# Patient Record
Sex: Female | Born: 1955 | Race: White | Hispanic: No | Marital: Single | Smoking: Never smoker
Health system: Southern US, Community
[De-identification: ages and names within clinical notes are randomized; demographics above are authoritative.]

## PROBLEM LIST (undated history)

## (undated) DIAGNOSIS — K219 Gastro-esophageal reflux disease without esophagitis: Secondary | ICD-10-CM

## (undated) DIAGNOSIS — H269 Unspecified cataract: Secondary | ICD-10-CM

## (undated) DIAGNOSIS — Q282 Arteriovenous malformation of cerebral vessels: Secondary | ICD-10-CM

## (undated) DIAGNOSIS — E785 Hyperlipidemia, unspecified: Secondary | ICD-10-CM

## (undated) DIAGNOSIS — T7840XA Allergy, unspecified, initial encounter: Secondary | ICD-10-CM

## (undated) DIAGNOSIS — I639 Cerebral infarction, unspecified: Secondary | ICD-10-CM

## (undated) DIAGNOSIS — R569 Unspecified convulsions: Secondary | ICD-10-CM

## (undated) DIAGNOSIS — M1990 Unspecified osteoarthritis, unspecified site: Secondary | ICD-10-CM

## (undated) DIAGNOSIS — Z9989 Dependence on other enabling machines and devices: Secondary | ICD-10-CM

## (undated) HISTORY — DX: Hyperlipidemia, unspecified: E78.5

## (undated) HISTORY — PX: WISDOM TOOTH EXTRACTION: SHX21

## (undated) HISTORY — DX: Dependence on other enabling machines and devices: Z99.89

## (undated) HISTORY — DX: Allergy, unspecified, initial encounter: T78.40XA

## (undated) HISTORY — DX: Gastro-esophageal reflux disease without esophagitis: K21.9

## (undated) HISTORY — DX: Cerebral infarction, unspecified: I63.9

## (undated) HISTORY — DX: Unspecified cataract: H26.9

## (undated) HISTORY — DX: Unspecified osteoarthritis, unspecified site: M19.90

## (undated) HISTORY — DX: Arteriovenous malformation of cerebral vessels: Q28.2

## (undated) HISTORY — PX: TUBAL LIGATION: SHX77

## (undated) HISTORY — DX: Unspecified convulsions: R56.9

---

## 1978-09-19 HISTORY — PX: BRAIN SURGERY: SHX531

## 1979-05-04 HISTORY — PX: TUBAL LIGATION: SHX77

## 1985-10-01 HISTORY — PX: FOOT SURGERY: SHX648

## 2014-04-18 LAB — HM PAP SMEAR: HM PAP: NORMAL

## 2015-05-04 HISTORY — PX: POLYPECTOMY: SHX149

## 2015-05-04 HISTORY — PX: COLONOSCOPY: SHX174

## 2015-05-08 DIAGNOSIS — G43D1 Abdominal migraine, intractable: Secondary | ICD-10-CM | POA: Diagnosis not present

## 2015-05-08 DIAGNOSIS — G40219 Localization-related (focal) (partial) symptomatic epilepsy and epileptic syndromes with complex partial seizures, intractable, without status epilepticus: Secondary | ICD-10-CM | POA: Diagnosis not present

## 2015-06-26 DIAGNOSIS — D485 Neoplasm of uncertain behavior of skin: Secondary | ICD-10-CM | POA: Diagnosis not present

## 2015-06-26 DIAGNOSIS — D1801 Hemangioma of skin and subcutaneous tissue: Secondary | ICD-10-CM | POA: Diagnosis not present

## 2015-06-26 DIAGNOSIS — L820 Inflamed seborrheic keratosis: Secondary | ICD-10-CM | POA: Diagnosis not present

## 2015-06-26 DIAGNOSIS — L821 Other seborrheic keratosis: Secondary | ICD-10-CM | POA: Diagnosis not present

## 2015-06-26 DIAGNOSIS — L570 Actinic keratosis: Secondary | ICD-10-CM | POA: Diagnosis not present

## 2015-06-26 DIAGNOSIS — D225 Melanocytic nevi of trunk: Secondary | ICD-10-CM | POA: Diagnosis not present

## 2015-07-02 DIAGNOSIS — D128 Benign neoplasm of rectum: Secondary | ICD-10-CM | POA: Diagnosis not present

## 2015-07-02 DIAGNOSIS — D123 Benign neoplasm of transverse colon: Secondary | ICD-10-CM | POA: Diagnosis not present

## 2015-07-02 DIAGNOSIS — K621 Rectal polyp: Secondary | ICD-10-CM | POA: Diagnosis not present

## 2015-07-02 DIAGNOSIS — K640 First degree hemorrhoids: Secondary | ICD-10-CM | POA: Diagnosis not present

## 2015-07-02 DIAGNOSIS — Z86010 Personal history of colonic polyps: Secondary | ICD-10-CM | POA: Diagnosis not present

## 2015-07-02 LAB — HM COLONOSCOPY

## 2015-09-08 DIAGNOSIS — H16223 Keratoconjunctivitis sicca, not specified as Sjogren's, bilateral: Secondary | ICD-10-CM | POA: Diagnosis not present

## 2015-09-08 DIAGNOSIS — Z0101 Encounter for examination of eyes and vision with abnormal findings: Secondary | ICD-10-CM | POA: Diagnosis not present

## 2015-09-08 DIAGNOSIS — H2513 Age-related nuclear cataract, bilateral: Secondary | ICD-10-CM | POA: Diagnosis not present

## 2015-09-12 DIAGNOSIS — Z8679 Personal history of other diseases of the circulatory system: Secondary | ICD-10-CM | POA: Diagnosis not present

## 2015-09-12 DIAGNOSIS — S098XXA Other specified injuries of head, initial encounter: Secondary | ICD-10-CM | POA: Diagnosis not present

## 2015-09-12 DIAGNOSIS — Z7982 Long term (current) use of aspirin: Secondary | ICD-10-CM | POA: Diagnosis not present

## 2015-09-12 DIAGNOSIS — M25562 Pain in left knee: Secondary | ICD-10-CM | POA: Diagnosis not present

## 2015-09-12 DIAGNOSIS — Z79899 Other long term (current) drug therapy: Secondary | ICD-10-CM | POA: Diagnosis not present

## 2015-09-12 DIAGNOSIS — Q283 Other malformations of cerebral vessels: Secondary | ICD-10-CM | POA: Diagnosis not present

## 2015-09-12 DIAGNOSIS — M25551 Pain in right hip: Secondary | ICD-10-CM | POA: Diagnosis not present

## 2015-09-12 DIAGNOSIS — S80212A Abrasion, left knee, initial encounter: Secondary | ICD-10-CM | POA: Diagnosis not present

## 2015-09-12 DIAGNOSIS — S0990XA Unspecified injury of head, initial encounter: Secondary | ICD-10-CM | POA: Diagnosis not present

## 2015-09-12 DIAGNOSIS — S0181XA Laceration without foreign body of other part of head, initial encounter: Secondary | ICD-10-CM | POA: Diagnosis not present

## 2015-09-12 DIAGNOSIS — S064X0A Epidural hemorrhage without loss of consciousness, initial encounter: Secondary | ICD-10-CM | POA: Diagnosis not present

## 2015-09-12 DIAGNOSIS — M542 Cervicalgia: Secondary | ICD-10-CM | POA: Diagnosis not present

## 2015-12-25 DIAGNOSIS — Z0000 Encounter for general adult medical examination without abnormal findings: Secondary | ICD-10-CM | POA: Diagnosis not present

## 2015-12-25 DIAGNOSIS — G40219 Localization-related (focal) (partial) symptomatic epilepsy and epileptic syndromes with complex partial seizures, intractable, without status epilepticus: Secondary | ICD-10-CM | POA: Diagnosis not present

## 2015-12-25 DIAGNOSIS — Z79899 Other long term (current) drug therapy: Secondary | ICD-10-CM | POA: Diagnosis not present

## 2015-12-29 DIAGNOSIS — G8191 Hemiplegia, unspecified affecting right dominant side: Secondary | ICD-10-CM | POA: Insufficient documentation

## 2016-01-01 DIAGNOSIS — Z79899 Other long term (current) drug therapy: Secondary | ICD-10-CM | POA: Diagnosis not present

## 2016-01-01 DIAGNOSIS — G43919 Migraine, unspecified, intractable, without status migrainosus: Secondary | ICD-10-CM | POA: Diagnosis not present

## 2016-01-01 DIAGNOSIS — G40909 Epilepsy, unspecified, not intractable, without status epilepticus: Secondary | ICD-10-CM | POA: Diagnosis not present

## 2016-01-01 DIAGNOSIS — M6281 Muscle weakness (generalized): Secondary | ICD-10-CM | POA: Diagnosis not present

## 2016-01-02 DIAGNOSIS — G43919 Migraine, unspecified, intractable, without status migrainosus: Secondary | ICD-10-CM | POA: Diagnosis not present

## 2016-01-02 DIAGNOSIS — G40909 Epilepsy, unspecified, not intractable, without status epilepticus: Secondary | ICD-10-CM | POA: Diagnosis not present

## 2016-01-02 DIAGNOSIS — M6281 Muscle weakness (generalized): Secondary | ICD-10-CM | POA: Diagnosis not present

## 2016-01-02 DIAGNOSIS — Z79899 Other long term (current) drug therapy: Secondary | ICD-10-CM | POA: Diagnosis not present

## 2016-01-06 DIAGNOSIS — M6281 Muscle weakness (generalized): Secondary | ICD-10-CM | POA: Diagnosis not present

## 2016-01-06 DIAGNOSIS — G43919 Migraine, unspecified, intractable, without status migrainosus: Secondary | ICD-10-CM | POA: Diagnosis not present

## 2016-01-06 DIAGNOSIS — G40909 Epilepsy, unspecified, not intractable, without status epilepticus: Secondary | ICD-10-CM | POA: Diagnosis not present

## 2016-01-06 DIAGNOSIS — Z79899 Other long term (current) drug therapy: Secondary | ICD-10-CM | POA: Diagnosis not present

## 2016-01-07 DIAGNOSIS — G40909 Epilepsy, unspecified, not intractable, without status epilepticus: Secondary | ICD-10-CM | POA: Diagnosis not present

## 2016-01-07 DIAGNOSIS — Z79899 Other long term (current) drug therapy: Secondary | ICD-10-CM | POA: Diagnosis not present

## 2016-01-07 DIAGNOSIS — M6281 Muscle weakness (generalized): Secondary | ICD-10-CM | POA: Diagnosis not present

## 2016-01-07 DIAGNOSIS — G43919 Migraine, unspecified, intractable, without status migrainosus: Secondary | ICD-10-CM | POA: Diagnosis not present

## 2016-01-08 DIAGNOSIS — M6281 Muscle weakness (generalized): Secondary | ICD-10-CM | POA: Diagnosis not present

## 2016-01-08 DIAGNOSIS — Z79899 Other long term (current) drug therapy: Secondary | ICD-10-CM | POA: Diagnosis not present

## 2016-01-08 DIAGNOSIS — G40909 Epilepsy, unspecified, not intractable, without status epilepticus: Secondary | ICD-10-CM | POA: Diagnosis not present

## 2016-01-08 DIAGNOSIS — G43919 Migraine, unspecified, intractable, without status migrainosus: Secondary | ICD-10-CM | POA: Diagnosis not present

## 2016-01-13 DIAGNOSIS — G40909 Epilepsy, unspecified, not intractable, without status epilepticus: Secondary | ICD-10-CM | POA: Diagnosis not present

## 2016-01-13 DIAGNOSIS — G43919 Migraine, unspecified, intractable, without status migrainosus: Secondary | ICD-10-CM | POA: Diagnosis not present

## 2016-01-13 DIAGNOSIS — M6281 Muscle weakness (generalized): Secondary | ICD-10-CM | POA: Diagnosis not present

## 2016-01-13 DIAGNOSIS — Z79899 Other long term (current) drug therapy: Secondary | ICD-10-CM | POA: Diagnosis not present

## 2016-01-15 DIAGNOSIS — G40909 Epilepsy, unspecified, not intractable, without status epilepticus: Secondary | ICD-10-CM | POA: Diagnosis not present

## 2016-01-15 DIAGNOSIS — G43919 Migraine, unspecified, intractable, without status migrainosus: Secondary | ICD-10-CM | POA: Diagnosis not present

## 2016-01-15 DIAGNOSIS — M6281 Muscle weakness (generalized): Secondary | ICD-10-CM | POA: Diagnosis not present

## 2016-01-15 DIAGNOSIS — Z79899 Other long term (current) drug therapy: Secondary | ICD-10-CM | POA: Diagnosis not present

## 2016-01-20 DIAGNOSIS — G43919 Migraine, unspecified, intractable, without status migrainosus: Secondary | ICD-10-CM | POA: Diagnosis not present

## 2016-01-20 DIAGNOSIS — G40909 Epilepsy, unspecified, not intractable, without status epilepticus: Secondary | ICD-10-CM | POA: Diagnosis not present

## 2016-01-20 DIAGNOSIS — M6281 Muscle weakness (generalized): Secondary | ICD-10-CM | POA: Diagnosis not present

## 2016-01-20 DIAGNOSIS — Z79899 Other long term (current) drug therapy: Secondary | ICD-10-CM | POA: Diagnosis not present

## 2016-01-22 DIAGNOSIS — M6281 Muscle weakness (generalized): Secondary | ICD-10-CM | POA: Diagnosis not present

## 2016-01-22 DIAGNOSIS — G43919 Migraine, unspecified, intractable, without status migrainosus: Secondary | ICD-10-CM | POA: Diagnosis not present

## 2016-01-22 DIAGNOSIS — Z79899 Other long term (current) drug therapy: Secondary | ICD-10-CM | POA: Diagnosis not present

## 2016-01-22 DIAGNOSIS — G40909 Epilepsy, unspecified, not intractable, without status epilepticus: Secondary | ICD-10-CM | POA: Diagnosis not present

## 2016-01-27 DIAGNOSIS — G40909 Epilepsy, unspecified, not intractable, without status epilepticus: Secondary | ICD-10-CM | POA: Diagnosis not present

## 2016-01-27 DIAGNOSIS — G43919 Migraine, unspecified, intractable, without status migrainosus: Secondary | ICD-10-CM | POA: Diagnosis not present

## 2016-01-27 DIAGNOSIS — M6281 Muscle weakness (generalized): Secondary | ICD-10-CM | POA: Diagnosis not present

## 2016-01-27 DIAGNOSIS — Z79899 Other long term (current) drug therapy: Secondary | ICD-10-CM | POA: Diagnosis not present

## 2016-01-29 DIAGNOSIS — M6281 Muscle weakness (generalized): Secondary | ICD-10-CM | POA: Diagnosis not present

## 2016-01-29 DIAGNOSIS — G40909 Epilepsy, unspecified, not intractable, without status epilepticus: Secondary | ICD-10-CM | POA: Diagnosis not present

## 2016-01-29 DIAGNOSIS — Z79899 Other long term (current) drug therapy: Secondary | ICD-10-CM | POA: Diagnosis not present

## 2016-01-29 DIAGNOSIS — G43919 Migraine, unspecified, intractable, without status migrainosus: Secondary | ICD-10-CM | POA: Diagnosis not present

## 2016-02-02 DIAGNOSIS — Z23 Encounter for immunization: Secondary | ICD-10-CM | POA: Diagnosis not present

## 2016-02-03 DIAGNOSIS — G43919 Migraine, unspecified, intractable, without status migrainosus: Secondary | ICD-10-CM | POA: Diagnosis not present

## 2016-02-03 DIAGNOSIS — M6281 Muscle weakness (generalized): Secondary | ICD-10-CM | POA: Diagnosis not present

## 2016-02-03 DIAGNOSIS — G40909 Epilepsy, unspecified, not intractable, without status epilepticus: Secondary | ICD-10-CM | POA: Diagnosis not present

## 2016-02-03 DIAGNOSIS — Z79899 Other long term (current) drug therapy: Secondary | ICD-10-CM | POA: Diagnosis not present

## 2016-02-12 DIAGNOSIS — Z79899 Other long term (current) drug therapy: Secondary | ICD-10-CM | POA: Diagnosis not present

## 2016-02-12 DIAGNOSIS — G43919 Migraine, unspecified, intractable, without status migrainosus: Secondary | ICD-10-CM | POA: Diagnosis not present

## 2016-02-12 DIAGNOSIS — G40909 Epilepsy, unspecified, not intractable, without status epilepticus: Secondary | ICD-10-CM | POA: Diagnosis not present

## 2016-02-12 DIAGNOSIS — M6281 Muscle weakness (generalized): Secondary | ICD-10-CM | POA: Diagnosis not present

## 2016-02-27 DIAGNOSIS — G43919 Migraine, unspecified, intractable, without status migrainosus: Secondary | ICD-10-CM | POA: Diagnosis not present

## 2016-02-27 DIAGNOSIS — M6281 Muscle weakness (generalized): Secondary | ICD-10-CM | POA: Diagnosis not present

## 2016-02-27 DIAGNOSIS — G40909 Epilepsy, unspecified, not intractable, without status epilepticus: Secondary | ICD-10-CM | POA: Diagnosis not present

## 2016-02-27 DIAGNOSIS — Z79899 Other long term (current) drug therapy: Secondary | ICD-10-CM | POA: Diagnosis not present

## 2016-03-24 LAB — HM MAMMOGRAPHY: HM Mammogram: NORMAL (ref 0–4)

## 2016-04-13 DIAGNOSIS — M5030 Other cervical disc degeneration, unspecified cervical region: Secondary | ICD-10-CM | POA: Diagnosis not present

## 2016-04-13 DIAGNOSIS — M9901 Segmental and somatic dysfunction of cervical region: Secondary | ICD-10-CM | POA: Diagnosis not present

## 2016-04-14 DIAGNOSIS — R51 Headache: Secondary | ICD-10-CM

## 2016-04-14 DIAGNOSIS — R519 Headache, unspecified: Secondary | ICD-10-CM | POA: Insufficient documentation

## 2016-04-15 DIAGNOSIS — G4489 Other headache syndrome: Secondary | ICD-10-CM | POA: Diagnosis not present

## 2016-04-15 DIAGNOSIS — R51 Headache: Secondary | ICD-10-CM | POA: Diagnosis not present

## 2016-04-15 DIAGNOSIS — R52 Pain, unspecified: Secondary | ICD-10-CM | POA: Diagnosis not present

## 2016-04-15 DIAGNOSIS — Z8679 Personal history of other diseases of the circulatory system: Secondary | ICD-10-CM | POA: Diagnosis not present

## 2016-04-15 DIAGNOSIS — Z79899 Other long term (current) drug therapy: Secondary | ICD-10-CM | POA: Diagnosis not present

## 2016-04-15 DIAGNOSIS — Z7982 Long term (current) use of aspirin: Secondary | ICD-10-CM | POA: Diagnosis not present

## 2016-04-16 DIAGNOSIS — R51 Headache: Secondary | ICD-10-CM | POA: Diagnosis not present

## 2016-04-18 DIAGNOSIS — Z23 Encounter for immunization: Secondary | ICD-10-CM | POA: Diagnosis not present

## 2016-04-30 DIAGNOSIS — Z1231 Encounter for screening mammogram for malignant neoplasm of breast: Secondary | ICD-10-CM | POA: Diagnosis not present

## 2016-04-30 DIAGNOSIS — Z780 Asymptomatic menopausal state: Secondary | ICD-10-CM | POA: Diagnosis not present

## 2016-04-30 LAB — HM MAMMOGRAPHY

## 2016-05-07 DIAGNOSIS — G43911 Migraine, unspecified, intractable, with status migrainosus: Secondary | ICD-10-CM | POA: Diagnosis not present

## 2016-05-07 DIAGNOSIS — G40909 Epilepsy, unspecified, not intractable, without status epilepticus: Secondary | ICD-10-CM | POA: Diagnosis not present

## 2016-05-07 DIAGNOSIS — M6281 Muscle weakness (generalized): Secondary | ICD-10-CM | POA: Diagnosis not present

## 2016-05-08 DIAGNOSIS — R509 Fever, unspecified: Secondary | ICD-10-CM | POA: Diagnosis not present

## 2016-05-08 DIAGNOSIS — J0100 Acute maxillary sinusitis, unspecified: Secondary | ICD-10-CM | POA: Diagnosis not present

## 2016-05-10 DIAGNOSIS — M6281 Muscle weakness (generalized): Secondary | ICD-10-CM | POA: Diagnosis not present

## 2016-05-10 DIAGNOSIS — G40909 Epilepsy, unspecified, not intractable, without status epilepticus: Secondary | ICD-10-CM | POA: Diagnosis not present

## 2016-05-10 DIAGNOSIS — G43911 Migraine, unspecified, intractable, with status migrainosus: Secondary | ICD-10-CM | POA: Diagnosis not present

## 2016-05-11 DIAGNOSIS — G40909 Epilepsy, unspecified, not intractable, without status epilepticus: Secondary | ICD-10-CM | POA: Diagnosis not present

## 2016-05-11 DIAGNOSIS — G43911 Migraine, unspecified, intractable, with status migrainosus: Secondary | ICD-10-CM | POA: Diagnosis not present

## 2016-05-11 DIAGNOSIS — M6281 Muscle weakness (generalized): Secondary | ICD-10-CM | POA: Diagnosis not present

## 2016-05-13 DIAGNOSIS — G40909 Epilepsy, unspecified, not intractable, without status epilepticus: Secondary | ICD-10-CM | POA: Diagnosis not present

## 2016-05-13 DIAGNOSIS — M6281 Muscle weakness (generalized): Secondary | ICD-10-CM | POA: Diagnosis not present

## 2016-05-13 DIAGNOSIS — G43911 Migraine, unspecified, intractable, with status migrainosus: Secondary | ICD-10-CM | POA: Diagnosis not present

## 2016-05-14 DIAGNOSIS — M6281 Muscle weakness (generalized): Secondary | ICD-10-CM | POA: Diagnosis not present

## 2016-05-14 DIAGNOSIS — G40909 Epilepsy, unspecified, not intractable, without status epilepticus: Secondary | ICD-10-CM | POA: Diagnosis not present

## 2016-05-14 DIAGNOSIS — G43911 Migraine, unspecified, intractable, with status migrainosus: Secondary | ICD-10-CM | POA: Diagnosis not present

## 2016-05-18 DIAGNOSIS — G40909 Epilepsy, unspecified, not intractable, without status epilepticus: Secondary | ICD-10-CM | POA: Diagnosis not present

## 2016-05-18 DIAGNOSIS — M6281 Muscle weakness (generalized): Secondary | ICD-10-CM | POA: Diagnosis not present

## 2016-05-18 DIAGNOSIS — G43911 Migraine, unspecified, intractable, with status migrainosus: Secondary | ICD-10-CM | POA: Diagnosis not present

## 2016-05-20 DIAGNOSIS — G43911 Migraine, unspecified, intractable, with status migrainosus: Secondary | ICD-10-CM | POA: Diagnosis not present

## 2016-05-20 DIAGNOSIS — G40909 Epilepsy, unspecified, not intractable, without status epilepticus: Secondary | ICD-10-CM | POA: Diagnosis not present

## 2016-05-20 DIAGNOSIS — M6281 Muscle weakness (generalized): Secondary | ICD-10-CM | POA: Diagnosis not present

## 2016-05-21 DIAGNOSIS — G40909 Epilepsy, unspecified, not intractable, without status epilepticus: Secondary | ICD-10-CM | POA: Diagnosis not present

## 2016-05-21 DIAGNOSIS — M6281 Muscle weakness (generalized): Secondary | ICD-10-CM | POA: Diagnosis not present

## 2016-05-21 DIAGNOSIS — G43911 Migraine, unspecified, intractable, with status migrainosus: Secondary | ICD-10-CM | POA: Diagnosis not present

## 2016-05-25 DIAGNOSIS — M6281 Muscle weakness (generalized): Secondary | ICD-10-CM | POA: Diagnosis not present

## 2016-05-25 DIAGNOSIS — G43911 Migraine, unspecified, intractable, with status migrainosus: Secondary | ICD-10-CM | POA: Diagnosis not present

## 2016-05-25 DIAGNOSIS — G40909 Epilepsy, unspecified, not intractable, without status epilepticus: Secondary | ICD-10-CM | POA: Diagnosis not present

## 2016-05-27 DIAGNOSIS — G43911 Migraine, unspecified, intractable, with status migrainosus: Secondary | ICD-10-CM | POA: Diagnosis not present

## 2016-05-27 DIAGNOSIS — G40909 Epilepsy, unspecified, not intractable, without status epilepticus: Secondary | ICD-10-CM | POA: Diagnosis not present

## 2016-05-27 DIAGNOSIS — M6281 Muscle weakness (generalized): Secondary | ICD-10-CM | POA: Diagnosis not present

## 2016-05-28 DIAGNOSIS — G43911 Migraine, unspecified, intractable, with status migrainosus: Secondary | ICD-10-CM | POA: Diagnosis not present

## 2016-05-28 DIAGNOSIS — G40909 Epilepsy, unspecified, not intractable, without status epilepticus: Secondary | ICD-10-CM | POA: Diagnosis not present

## 2016-05-28 DIAGNOSIS — M6281 Muscle weakness (generalized): Secondary | ICD-10-CM | POA: Diagnosis not present

## 2016-06-03 DIAGNOSIS — G43911 Migraine, unspecified, intractable, with status migrainosus: Secondary | ICD-10-CM | POA: Diagnosis not present

## 2016-06-03 DIAGNOSIS — G40909 Epilepsy, unspecified, not intractable, without status epilepticus: Secondary | ICD-10-CM | POA: Diagnosis not present

## 2016-06-03 DIAGNOSIS — M6281 Muscle weakness (generalized): Secondary | ICD-10-CM | POA: Diagnosis not present

## 2016-06-04 DIAGNOSIS — G43911 Migraine, unspecified, intractable, with status migrainosus: Secondary | ICD-10-CM | POA: Diagnosis not present

## 2016-06-04 DIAGNOSIS — M6281 Muscle weakness (generalized): Secondary | ICD-10-CM | POA: Diagnosis not present

## 2016-06-04 DIAGNOSIS — G40909 Epilepsy, unspecified, not intractable, without status epilepticus: Secondary | ICD-10-CM | POA: Diagnosis not present

## 2016-06-08 DIAGNOSIS — G40909 Epilepsy, unspecified, not intractable, without status epilepticus: Secondary | ICD-10-CM | POA: Diagnosis not present

## 2016-06-08 DIAGNOSIS — G43911 Migraine, unspecified, intractable, with status migrainosus: Secondary | ICD-10-CM | POA: Diagnosis not present

## 2016-06-08 DIAGNOSIS — M6281 Muscle weakness (generalized): Secondary | ICD-10-CM | POA: Diagnosis not present

## 2016-06-10 DIAGNOSIS — G43911 Migraine, unspecified, intractable, with status migrainosus: Secondary | ICD-10-CM | POA: Diagnosis not present

## 2016-06-10 DIAGNOSIS — M6281 Muscle weakness (generalized): Secondary | ICD-10-CM | POA: Diagnosis not present

## 2016-06-10 DIAGNOSIS — G40909 Epilepsy, unspecified, not intractable, without status epilepticus: Secondary | ICD-10-CM | POA: Diagnosis not present

## 2016-06-15 DIAGNOSIS — G43911 Migraine, unspecified, intractable, with status migrainosus: Secondary | ICD-10-CM | POA: Diagnosis not present

## 2016-06-15 DIAGNOSIS — G40909 Epilepsy, unspecified, not intractable, without status epilepticus: Secondary | ICD-10-CM | POA: Diagnosis not present

## 2016-06-15 DIAGNOSIS — M6281 Muscle weakness (generalized): Secondary | ICD-10-CM | POA: Diagnosis not present

## 2016-06-17 DIAGNOSIS — G43911 Migraine, unspecified, intractable, with status migrainosus: Secondary | ICD-10-CM | POA: Diagnosis not present

## 2016-06-17 DIAGNOSIS — G40909 Epilepsy, unspecified, not intractable, without status epilepticus: Secondary | ICD-10-CM | POA: Diagnosis not present

## 2016-06-17 DIAGNOSIS — M6281 Muscle weakness (generalized): Secondary | ICD-10-CM | POA: Diagnosis not present

## 2016-06-21 DIAGNOSIS — M6281 Muscle weakness (generalized): Secondary | ICD-10-CM | POA: Diagnosis not present

## 2016-06-21 DIAGNOSIS — G43911 Migraine, unspecified, intractable, with status migrainosus: Secondary | ICD-10-CM | POA: Diagnosis not present

## 2016-06-21 DIAGNOSIS — G40909 Epilepsy, unspecified, not intractable, without status epilepticus: Secondary | ICD-10-CM | POA: Diagnosis not present

## 2016-06-22 DIAGNOSIS — G43D1 Abdominal migraine, intractable: Secondary | ICD-10-CM | POA: Diagnosis not present

## 2016-06-22 DIAGNOSIS — G40219 Localization-related (focal) (partial) symptomatic epilepsy and epileptic syndromes with complex partial seizures, intractable, without status epilepticus: Secondary | ICD-10-CM | POA: Diagnosis not present

## 2016-06-28 DIAGNOSIS — M6281 Muscle weakness (generalized): Secondary | ICD-10-CM | POA: Diagnosis not present

## 2016-06-28 DIAGNOSIS — G40909 Epilepsy, unspecified, not intractable, without status epilepticus: Secondary | ICD-10-CM | POA: Diagnosis not present

## 2016-06-28 DIAGNOSIS — G43911 Migraine, unspecified, intractable, with status migrainosus: Secondary | ICD-10-CM | POA: Diagnosis not present

## 2016-07-22 DIAGNOSIS — D1801 Hemangioma of skin and subcutaneous tissue: Secondary | ICD-10-CM | POA: Diagnosis not present

## 2016-07-22 DIAGNOSIS — D225 Melanocytic nevi of trunk: Secondary | ICD-10-CM | POA: Diagnosis not present

## 2016-07-22 DIAGNOSIS — L570 Actinic keratosis: Secondary | ICD-10-CM | POA: Diagnosis not present

## 2016-07-22 DIAGNOSIS — D485 Neoplasm of uncertain behavior of skin: Secondary | ICD-10-CM | POA: Diagnosis not present

## 2016-07-22 DIAGNOSIS — L821 Other seborrheic keratosis: Secondary | ICD-10-CM | POA: Diagnosis not present

## 2016-08-17 DIAGNOSIS — L570 Actinic keratosis: Secondary | ICD-10-CM | POA: Diagnosis not present

## 2016-09-15 DIAGNOSIS — H2513 Age-related nuclear cataract, bilateral: Secondary | ICD-10-CM | POA: Diagnosis not present

## 2016-09-15 DIAGNOSIS — Z0101 Encounter for examination of eyes and vision with abnormal findings: Secondary | ICD-10-CM | POA: Diagnosis not present

## 2016-11-15 ENCOUNTER — Encounter: Payer: Self-pay | Admitting: Internal Medicine

## 2016-11-15 ENCOUNTER — Ambulatory Visit (INDEPENDENT_AMBULATORY_CARE_PROVIDER_SITE_OTHER): Payer: Medicare Other | Admitting: Internal Medicine

## 2016-11-15 VITALS — BP 132/76 | HR 69 | Temp 97.5°F | Resp 16 | Ht 66.75 in | Wt 159.2 lb

## 2016-11-15 DIAGNOSIS — G40209 Localization-related (focal) (partial) symptomatic epilepsy and epileptic syndromes with complex partial seizures, not intractable, without status epilepticus: Secondary | ICD-10-CM

## 2016-11-15 DIAGNOSIS — E559 Vitamin D deficiency, unspecified: Secondary | ICD-10-CM

## 2016-11-15 DIAGNOSIS — R51 Headache: Secondary | ICD-10-CM

## 2016-11-15 DIAGNOSIS — R7309 Other abnormal glucose: Secondary | ICD-10-CM | POA: Diagnosis not present

## 2016-11-15 DIAGNOSIS — R0989 Other specified symptoms and signs involving the circulatory and respiratory systems: Secondary | ICD-10-CM

## 2016-11-15 DIAGNOSIS — E538 Deficiency of other specified B group vitamins: Secondary | ICD-10-CM

## 2016-11-15 DIAGNOSIS — G40219 Localization-related (focal) (partial) symptomatic epilepsy and epileptic syndromes with complex partial seizures, intractable, without status epilepticus: Secondary | ICD-10-CM | POA: Insufficient documentation

## 2016-11-15 DIAGNOSIS — Z79899 Other long term (current) drug therapy: Secondary | ICD-10-CM | POA: Diagnosis not present

## 2016-11-15 DIAGNOSIS — Z13220 Encounter for screening for lipoid disorders: Secondary | ICD-10-CM

## 2016-11-15 DIAGNOSIS — G40309 Generalized idiopathic epilepsy and epileptic syndromes, not intractable, without status epilepticus: Secondary | ICD-10-CM | POA: Diagnosis not present

## 2016-11-15 DIAGNOSIS — R519 Headache, unspecified: Secondary | ICD-10-CM

## 2016-11-15 DIAGNOSIS — G40909 Epilepsy, unspecified, not intractable, without status epilepticus: Secondary | ICD-10-CM | POA: Insufficient documentation

## 2016-11-15 DIAGNOSIS — G40409 Other generalized epilepsy and epileptic syndromes, not intractable, without status epilepticus: Secondary | ICD-10-CM | POA: Insufficient documentation

## 2016-11-15 DIAGNOSIS — G8929 Other chronic pain: Secondary | ICD-10-CM

## 2016-11-15 LAB — HEPATIC FUNCTION PANEL
ALBUMIN: 4 g/dL (ref 3.6–5.1)
ALK PHOS: 105 U/L (ref 33–130)
ALT: 21 U/L (ref 6–29)
AST: 19 U/L (ref 10–35)
BILIRUBIN TOTAL: 0.2 mg/dL (ref 0.2–1.2)
Bilirubin, Direct: 0 mg/dL (ref <=0.2)
Indirect Bilirubin: 0.2 mg/dL (ref 0.2–1.2)
TOTAL PROTEIN: 6.9 g/dL (ref 6.1–8.1)

## 2016-11-15 LAB — CBC WITH DIFFERENTIAL/PLATELET
Basophils Absolute: 0 cells/uL (ref 0–200)
Basophils Relative: 0 %
Eosinophils Absolute: 62 cells/uL (ref 15–500)
Eosinophils Relative: 1 %
HCT: 40.1 % (ref 35.0–45.0)
Hemoglobin: 13.2 g/dL (ref 11.7–15.5)
Lymphocytes Relative: 30 %
Lymphs Abs: 1860 cells/uL (ref 850–3900)
MCH: 31.3 pg (ref 27.0–33.0)
MCHC: 32.9 g/dL (ref 32.0–36.0)
MCV: 95 fL (ref 80.0–100.0)
MPV: 10.7 fL (ref 7.5–12.5)
Monocytes Absolute: 558 cells/uL (ref 200–950)
Monocytes Relative: 9 %
Neutro Abs: 3720 cells/uL (ref 1500–7800)
Neutrophils Relative %: 60 %
Platelets: 245 10*3/uL (ref 140–400)
RBC: 4.22 MIL/uL (ref 3.80–5.10)
RDW: 12.9 % (ref 11.0–15.0)
WBC: 6.2 10*3/uL (ref 3.8–10.8)

## 2016-11-15 LAB — BASIC METABOLIC PANEL WITH GFR
BUN: 28 mg/dL — AB (ref 7–25)
CALCIUM: 9.5 mg/dL (ref 8.6–10.4)
CO2: 26 mmol/L (ref 20–31)
Chloride: 107 mmol/L (ref 98–110)
Creat: 1.19 mg/dL — ABNORMAL HIGH (ref 0.50–1.05)
GFR, EST AFRICAN AMERICAN: 58 mL/min — AB (ref >=60)
GFR, EST NON AFRICAN AMERICAN: 50 mL/min — AB (ref >=60)
GLUCOSE: 101 mg/dL — AB (ref 65–99)
POTASSIUM: 3.8 mmol/L (ref 3.5–5.3)
Sodium: 141 mmol/L (ref 135–146)

## 2016-11-15 LAB — LIPID PANEL
CHOLESTEROL: 167 mg/dL (ref <200)
HDL: 30 mg/dL — AB (ref >50)
LDL CALC: 68 mg/dL (ref <100)
TRIGLYCERIDES: 344 mg/dL — AB (ref <150)
Total CHOL/HDL Ratio: 5.6 Ratio — ABNORMAL HIGH (ref <5.0)
VLDL: 69 mg/dL — ABNORMAL HIGH (ref <30)

## 2016-11-15 LAB — TSH: TSH: 1.61 mIU/L

## 2016-11-15 NOTE — Patient Instructions (Signed)

## 2016-11-15 NOTE — Progress Notes (Signed)
[facility]   Unk Pinto, M.D.      Uvaldo Bristle. Silverio Lay, P.A.-C [facility]                [place] [address] Telephone [phone] Telefax [phone]      This very nice 61 y.o. SWF presents as a new patient for evaluation and to establish care having recently relocated to [place].     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 She had her 1st stroke at age 48 in 52 and had 3 more strokes by 1980 when she underwent her 1st surgery for AVM. She relates her last seizure was 57. Apparently she had her last Head CT scan in Jan 2018 at [facility] in [place]. 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      Patient is screened expectantly for elevated BP. Patient's BP has been controlled at home.  Today's BP is at goal - 132/76. Patient denies any cardiac symptoms as chest pain, palpitations, shortness of breath, dizziness or ankle swelling.     Patient is screened expectantly for abnormal lipids.   Patient is screened expectantly for glucose intolerance/insulin resistance. and patient denies reactive hypoglycemic symptoms, visual blurring, diabetic polys or paresthesias.  Finally, patient  Is screened expectantly for Vitamin D Deficiency.  Meds are reviewed per MAR.  Allergies  Allergen Reactions  . Aspirin Other (See Comments)    CONTRAINDICATED DUE TO HISTORY OF BLEEDING IN BRAIN   Past Medical History:  Diagnosis Date  . AVM (arteriovenous malformation) brain   . 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 Pneumococcal-Unspecified 05/03/2004  . Tdap 08/11/2009  . Zoster 04/14/2012   Past Surgical History:  Procedure Laterality Date  . BRAIN SURGERY     Family History  Problem Relation Age of  Onset  . Heart disease Mother   . Diabetes Father   . Hyperlipidemia Father   . Hypertension Father   . Dementia Father   . Hyperlipidemia Paternal Uncle   . Hypertension Paternal Uncle   . Dementia Paternal Uncle    Social History   Social History  . Marital status: Unknown    Spouse name: N/A  . Number of children: N/A  . Years of education: N/A   Occupational History  . 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    Social History Main Topics  . Smoking status: Never Smoker  . Smokeless tobacco: Never Used  . Alcohol use No  . Drug use: No  . Sexual activity: No   Social History Narrative  . Had resided with her mother who died about 6 moths ago and currently residing with an Interior and spatial designer.    ROS Constitutional: Denies fever, chills, weight loss/gain, headaches, insomnia,  night sweats or change in appetite. Does c/o fatigue. Eyes: Denies redness, blurred vision, diplopia, discharge, itchy or watery eyes.  ENT: Denies discharge, congestion, post nasal drip, epistaxis, sore throat, earache, hearing loss, dental pain, Tinnitus, Vertigo, Sinus pain or snoring.  Cardio: Denies chest pain, palpitations, irregular heartbeat, syncope, dyspnea, diaphoresis, orthopnea, PND, claudication or edema Respiratory: denies cough, dyspnea,  DOE, pleurisy, hoarseness, laryngitis or wheezing.  Gastrointestinal: Denies dysphagia, heartburn, reflux, water brash, pain, cramps, nausea, vomiting, bloating, diarrhea, constipation, hematemesis, melena, hematochezia, jaundice or hemorrhoids Genitourinary: Denies dysuria, frequency, urgency, nocturia, hesitancy, discharge, hematuria or flank pain Musculoskeletal: Denies arthralgia, myalgia, stiffness, Jt. Swelling, pain, limp or strain/sprain. Denies Falls. Skin: Denies puritis, rash, hives, warts, acne, eczema or change in skin lesion Neuro: No weakness, tremor, incoordination, spasms, paresthesia or pain Psychiatric: Denies  confusion, memory loss or sensory loss. Denies Depression. Endocrine: Denies change in weight, skin, hair change, nocturia, and paresthesia, diabetic polys, visual blurring or hyper / hypo glycemic episodes.  Heme/Lymph: No excessive bleeding, bruising or enlarged lymph nodes.  Physical Exam  BP 132/76   Pulse 69   Temp (!) 97.5 F (36.4 C)   Resp 16   Ht 5' 6.75" (1.695 m)   Wt 159 lb 3.2 oz (72.2 kg)   BMI 25.12 kg/m   General Appearance: Well nourished and well groomed and in no apparent distress.  Eyes: PERRLA, EOMs, conjunctiva no swelling or erythema, normal fundi and vessels. Sinuses: No frontal/maxillary tenderness ENT/Mouth: EACs patent / TMs  nl. Nares clear without erythema, swelling, mucoid exudates. Oral hygiene is good. No erythema, swelling, or exudate. Tongue normal, non-obstructing. Tonsils not swollen or erythematous. Hearing normal.  Neck: Supple, thyroid normal. No bruits, nodes or JVD. Respiratory: Respiratory effort normal.  BS equal and clear bilateral without rales, rhonci, wheezing or stridor. Cardio: Heart sounds are normal with regular rate and rhythm and no murmurs, rubs or gallops. Peripheral pulses are normal and equal bilaterally without edema. No aortic or femoral bruits. Chest: symmetric with normal excursions and percussion.  Abdomen: Soft, with Nl bowel sounds. Nontender, no guarding, rebound, hernias, masses, or organomegaly.  Lymphatics: Non tender without lymphadenopathy.  Genitourinary: No hernias.Testes nl. DRE - prostate nl for age - smooth & firm w/o nodules. Musculoskeletal: Spastic Rt HP and limping gait supported with a quad cane. Skin: Warm and dry without rashes, lesions, cyanosis, clubbing or  ecchymosis.  Neuro: Cranial nerves intact, reflexes equal bilaterally. Normal muscle tone, no cerebellar symptoms. Sensation intact.  Pysch: Alert and oriented X 3 with normal affect, insight and judgment appropriate.   Assessment and Plan  1.  Partial symptomatic epilepsy with complex partial seizures, not intractable, without status epilepticus ([place])  2. Epilepsy, grand mal ([place])   3. Labile hypertension  - CBC with Differential/Platelet - BASIC METABOLIC PANEL WITH GFR - Magnesium - TSH  4. Chronic nonintractable headache, unspecified headache type   5. Screening for lipid disorders  - Hepatic function panel - Lipid panel - TSH  6. Abnormal glucose  - Hemoglobin A1c - Insulin, random  7. Vitamin D deficiency  - VITAMIN D 25 Hydroxy  8. Vitamin B12 deficiency  - Vitamin B12  9. Medication management  - CBC with Differential/Platelet - BASIC METABOLIC PANEL WITH GFR - Hepatic function panel - Magnesium - Lipid panel - TSH - Hemoglobin A1c - Insulin, random - VITAMIN D 25 Hydroxy  - Vitamin B12       Patient was counseled in prudent diet, weight control to achieve/maintain BMI less than 25, BP monitoring, regular exercise and medications as discussed.  Discussed med effects and SE's. Routine screening labs and tests as requested with regular follow-up as recommended. Over 40 minutes of exam, counseling, chart review and high complex critical decision making was performed

## 2016-11-16 LAB — HEMOGLOBIN A1C
Hgb A1c MFr Bld: 5.4 % (ref <5.7)
MEAN PLASMA GLUCOSE: 108 mg/dL

## 2016-11-16 LAB — VITAMIN D 25 HYDROXY (VIT D DEFICIENCY, FRACTURES): VIT D 25 HYDROXY: 74 ng/mL (ref 30–100)

## 2016-11-16 LAB — INSULIN, RANDOM: INSULIN: 27.5 u[IU]/mL — AB (ref 2.0–19.6)

## 2016-11-16 LAB — VITAMIN B12: Vitamin B-12: 1309 pg/mL — ABNORMAL HIGH (ref 200–1100)

## 2016-11-16 LAB — MAGNESIUM: Magnesium: 2 mg/dL (ref 1.5–2.5)

## 2016-11-16 NOTE — Progress Notes (Signed)
Pt aware of lab results & voiced understanding of those results.

## 2016-12-06 ENCOUNTER — Other Ambulatory Visit: Payer: Self-pay | Admitting: Internal Medicine

## 2017-02-28 NOTE — Progress Notes (Signed)
MEDICARE ANNUAL WELLNESS VISIT AND FOLLOW UP  Assessment:   Epilepsy, grand mal ([place]) Follows with Dr. Marlou Sa  Partial symptomatic epilepsy with complex partial seizures, intractable, without status epilepticus ([place]) Follows with Dr. Marlou Sa  Right hemiparesis [facility]) Follows with Dr. Marlou Sa  Headache, unspecified headache type Follows with Dr. Marlou Sa  Hypokalemia -     BASIC METABOLIC PANEL WITH GFR -     Magnesium  Long-term use of high-risk medication -     Magnesium  Labile hypertension - continue medications, DASH diet, exercise and monitor at home. Call if greater than 130/80.  -     CBC with Differential/Platelet -     BASIC METABOLIC PANEL WITH GFR -     Hepatic function panel -     TSH  Vitamin D deficiency Continue supplement  Vitamin B12 deficiency Continue supplement  Acute pain of right knee DIFFICULT FOR PATIENT TO LEAVE THE HOME, WILL SET UP FOR PT IN THE HOME -     Ambulatory referral to [place] due to old stroke -     Ambulatory referral to [facility] [place], CAN DO PT FOR [place]  Mixed hyperlipidemia -continue medications, check lipids, decrease fatty foods, increase activity.  -     Lipid panel    Over 40 minutes of exam, counseling, chart review and critical decision making was performed Future Appointments Date Time Provider [place]  06/01/2017 2:30 PM Unk Pinto, MD [facility] None     Plan:   During the course of the visit the patient was educated and counseled about appropriate screening and preventive services including:    Pneumococcal vaccine   Prevnar 13  Influenza vaccine  Td vaccine  Screening electrocardiogram  Bone densitometry screening  Colorectal cancer screening  Diabetes screening  Glaucoma screening  Nutrition counseling   Advanced directives: requested   Subjective:  Tonya Thompson is a 61 y.o. female who presents for Medicare Annual Wellness Visit and  3 month follow up.   Her blood pressure has not been controlled at home, today their BP is BP: (!) 146/84  She does not workout. She denies chest pain, shortness of breath, dizziness. Patient has history of seizures x age 8, age 24 had brain AVM that was monitored until first stroke at age 48 in 50 and 3 more strokes with subsequent AVM surgery, seizures, and spastic right hemiparesis, walks with cane. Last CT head was Jan 2018. Current has some legal issues going on with sister, ? POA.  She has had some right knee pain, fell 1 month ago due to throw rug, and would like PT at home, difficult for her to leave the house, has used [facility] in the past.  She does not drive herself, uncle drove her today. Lives with her [name] since her mother passed in Jan from CHF. Father lives at friends home a [place].   She is on cholesterol medication and denies myalgias. Her cholesterol is at goal. The cholesterol last visit was:   Lab Results  Component Value Date   CHOL 167 11/15/2016   HDL 30 (L) 11/15/2016   LDLCALC 68 11/15/2016   TRIG 344 (H) 11/15/2016   CHOLHDL 5.6 (H) 11/15/2016    She has been working on diet and exercise for prediabetes, and denies polydipsia and polyuria. Last A1C in the office was:  Lab Results  Component Value Date   HGBA1C 5.4 11/15/2016   Patient is on Vitamin D supplement.   Lab  Results  Component Value Date   VD25OH 74 11/15/2016     Lab Results  Component Value Date   GFRNONAA 50 (L) 11/15/2016   BMI is Body mass index is 24.3 kg/m., she is working on diet and exercise. Wt Readings from Last 3 Encounters:  03/01/17 154 lb (69.9 kg)  11/15/16 159 lb 3.2 oz (72.2 kg)    Medication Review: Current Outpatient Prescriptions on File Prior to Visit  Medication Sig Dispense Refill  . baclofen (LIORESAL) 10 MG tablet Take 10 mg by mouth.    . Calcium Carbonate-Vitamin D 600-400 MG-UNIT tablet Take by mouth.    . Cholecalciferol (VITAMIN D3) 2000 units capsule  Take by mouth.    . fexofenadine (ALLEGRA) 180 MG tablet Take 180 mg by mouth.    Marland Kitchen ibuprofen (ADVIL,MOTRIN) 400 MG tablet Take 400 mg by mouth.    . Multiple Vitamins-Minerals (MULTIVITAMIN WITH MINERALS) tablet Take by mouth.    . Omega-3 Fatty Acids (RA FISH OIL) 1000 MG CAPS Take by mouth.    . Potassium 99 MG TABS Take by mouth.    . topiramate (TOPAMAX) 100 MG tablet Take 300 mg by mouth.    . traMADol (ULTRAM) 50 MG tablet Take 50 mg by mouth.    . topiramate (TOPAMAX) 200 MG tablet TAKE 1 TABLET BY MOUTH 2 TIMES DAILY, INCREASE TO 1 TABLET 3 TIMES DAILY AS DIRECTED  11  . vitamin B-12 (CYANOCOBALAMIN) 500 MCG tablet Take by mouth.    . vitamin C (ASCORBIC ACID) 500 MG tablet Take 500 mg by mouth.     No current facility-administered medications on file prior to visit.     Allergies  Allergen Reactions  . Aspirin Other (See Comments)    CONTRAINDICATED DUE TO HISTORY OF BLEEDING IN BRAIN    Current Problems (verified) Patient Active Problem List   Diagnosis Date Noted  . Epilepsy, grand mal ([place]) 11/15/2016  . 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 Headache, unspecified headache type 04/14/2016  . Right hemiparesis ([place]) 12/29/2015  . Hypokalemia 09/17/2015  . Long-term use of high-risk medication 09/17/2015    Screening Tests Immunization History  Administered Date(s) Administered  . Influenza-Unspecified 02/16/2017  . Pneumococcal-Unspecified 05/03/2004  . Tdap 08/11/2009  . Zoster 04/14/2012   Preventative care: Last [facility], check 5 years Last mammogram: [facility] in [place] Last pap smear/pelvic exam: 3 YEARS AGO DEXA: May 29 2012 due  Prior vaccinations: TD or Tdap: 2011 Influenza: Oct 17th 2018  Pneumococcal: 2006 Prevnar13: due age 7 Shingles/Zostavax: 2013  Names of Other Physician/Practitioners you currently use: 1. [facility] here for primary care 2. DR. Lelan Pons, eye doctor, last visit YEARLY 3. DR. Loma Boston, dentist, last visit Feb 09 2017 Patient Care Team: Unk Pinto, MD as PCP - General (Internal Medicine)  SURGICAL HISTORY She  has a past surgical history that includes Brain surgery. FAMILY HISTORY Her family history includes Dementia in her father and paternal uncle; Diabetes in her father; Heart disease in her mother; Hyperlipidemia in her father and paternal uncle; Hypertension in her father and paternal uncle. SOCIAL HISTORY She  reports that she has never smoked. She has never used smokeless tobacco. She reports that she does not drink alcohol or use drugs.   MEDICARE WELLNESS OBJECTIVES: Physical activity: Current Exercise Habits: Home exercise routine, Type of exercise: stretching (CHAIR/BED EXERCISES), Time (Minutes): 30, Frequency (Times/Week): 4, Weekly Exercise (  Minutes/Week): 120, Intensity: Mild Cardiac risk factors: Cardiac Risk Factors include: advanced age (>32men, >27 women);dyslipidemia;hypertension;sedentary lifestyle Depression/mood screen:   Depression screen [facility] 2/9 03/01/2017  Decreased Interest 0  Down, Depressed, Hopeless 0  PHQ - 2 Score 0    ADLs:  In your present state of health, do you have any difficulty performing the following activities: 03/01/2017 11/15/2016  Hearing? N N  Vision? Y N  Difficulty concentrating or making decisions? N N  Walking or climbing stairs? Y Y  Comment - poor balance consequent of her Rt spastic HP.  Dressing or bathing? Y N  Comment SOME DUE TO HEMEPAREISS -  Doing errands, shopping? Y Y  Comment REQUIRES TRANSPORTATION requires Dispensing optician and eating ? N -  Using the Toilet? N -  In the past six months, have you accidently leaked urine? N -  Do you have problems with loss of bowel control? N -  Managing your Medications? N -  Managing your Finances? N -  Housekeeping or managing your Housekeeping? N -      Cognitive Testing  Alert? Yes  Normal Appearance?Yes  Oriented to person? Yes  Place? Yes   Time? Yes  Recall of three objects?  Yes  Can perform simple calculations? Yes  Displays appropriate judgment?Yes  Can read the correct time from a watch face?Yes  EOL planning: Does Patient Have a Medical Advance Directive?: Yes Type of Advance Directive: Healthcare Power of Attorney, Living will (PATIENT IS UPDATING AT THIS TIME) Does patient want to make changes to medical advance directive?: No - Patient declined Copy of [place] in Chart?: No - copy requested  ROS SEE ABOVE  Objective:     Today's Vitals   03/01/17 1426  BP: (!) 146/84  Pulse: 69  Resp: 15  Temp: (!) 97.5 F (36.4 C)  SpO2: 97%  Weight: 154 lb (69.9 kg)  Height: 5' 6.75" (1.695 m)   Body mass index is 24.3 kg/m.  General appearance: alert, no distress, WD/WN, female HEENT: normocephalic, sclerae anicteric, TMs pearly, nares patent, no discharge or erythema, pharynx normal Oral cavity: MMM, no lesions Neck: supple, no lymphadenopathy, no thyromegaly, no masses Heart: RRR, normal S1, S2, no murmurs Lungs: CTA bilaterally, no wheezes, rhonchi, or rales Abdomen: +bs, soft, non tender, non distended, no masses, no hepatomegaly, no splenomegaly Musculoskeletal: nontender, no swelling, no obvious deformity Extremities: no edema, no cyanosis, no clubbing Pulses: 2+ symmetric, upper and lower extremities, normal cap refill Neurological: alert, oriented x 3, CN2-12 intact, strength normal upper extremities and lower extremities left side, right side with decreased strength and spastic right hip and right hand with right ankle in brace, no cerebellar signs, gait abnormal, patient walks with cane Psychiatric: flat affect, behavior normal, pleasant   Medicare Attestation I have personally reviewed: The patient's medical and social history Their use of alcohol, tobacco or illicit drugs Their  current medications and supplements The patient's functional ability including ADLs,fall risks, home safety risks, cognitive, and hearing and visual impairment Diet and physical activities Evidence for depression or mood disorders  The patient's weight, height, BMI, and visual acuity have been recorded in the chart.  I have made referrals, counseling, and provided education to the patient based on review of the above and I have provided the patient with a written personalized care plan for preventive services.     Vicie Mutters, PA-C   03/01/2017

## 2017-03-01 ENCOUNTER — Ambulatory Visit (INDEPENDENT_AMBULATORY_CARE_PROVIDER_SITE_OTHER): Payer: Medicare Other | Admitting: Physician Assistant

## 2017-03-01 ENCOUNTER — Encounter: Payer: Self-pay | Admitting: Physician Assistant

## 2017-03-01 VITALS — BP 146/84 | HR 69 | Temp 97.5°F | Resp 15 | Ht 66.75 in | Wt 154.0 lb

## 2017-03-01 DIAGNOSIS — G8191 Hemiplegia, unspecified affecting right dominant side: Secondary | ICD-10-CM | POA: Diagnosis not present

## 2017-03-01 DIAGNOSIS — M25561 Pain in right knee: Secondary | ICD-10-CM

## 2017-03-01 DIAGNOSIS — Z0001 Encounter for general adult medical examination with abnormal findings: Secondary | ICD-10-CM | POA: Diagnosis not present

## 2017-03-01 DIAGNOSIS — G40309 Generalized idiopathic epilepsy and epileptic syndromes, not intractable, without status epilepticus: Secondary | ICD-10-CM

## 2017-03-01 DIAGNOSIS — E559 Vitamin D deficiency, unspecified: Secondary | ICD-10-CM

## 2017-03-01 DIAGNOSIS — R0989 Other specified symptoms and signs involving the circulatory and respiratory systems: Secondary | ICD-10-CM | POA: Diagnosis not present

## 2017-03-01 DIAGNOSIS — E782 Mixed hyperlipidemia: Secondary | ICD-10-CM | POA: Diagnosis not present

## 2017-03-01 DIAGNOSIS — E876 Hypokalemia: Secondary | ICD-10-CM

## 2017-03-01 DIAGNOSIS — I69398 Other sequelae of cerebral infarction: Secondary | ICD-10-CM | POA: Diagnosis not present

## 2017-03-01 DIAGNOSIS — Z79899 Other long term (current) drug therapy: Secondary | ICD-10-CM | POA: Diagnosis not present

## 2017-03-01 DIAGNOSIS — G40219 Localization-related (focal) (partial) symptomatic epilepsy and epileptic syndromes with complex partial seizures, intractable, without status epilepticus: Secondary | ICD-10-CM | POA: Diagnosis not present

## 2017-03-01 DIAGNOSIS — E538 Deficiency of other specified B group vitamins: Secondary | ICD-10-CM

## 2017-03-01 DIAGNOSIS — R2689 Other abnormalities of gait and mobility: Secondary | ICD-10-CM

## 2017-03-01 DIAGNOSIS — R6889 Other general symptoms and signs: Secondary | ICD-10-CM | POA: Diagnosis not present

## 2017-03-01 DIAGNOSIS — R51 Headache: Secondary | ICD-10-CM | POA: Diagnosis not present

## 2017-03-01 DIAGNOSIS — Z0000 Encounter for general adult medical examination without abnormal findings: Secondary | ICD-10-CM

## 2017-03-01 DIAGNOSIS — G40409 Other generalized epilepsy and epileptic syndromes, not intractable, without status epilepticus: Secondary | ICD-10-CM

## 2017-03-01 DIAGNOSIS — R519 Headache, unspecified: Secondary | ICD-10-CM

## 2017-03-01 NOTE — Patient Instructions (Signed)

## 2017-03-02 LAB — BASIC METABOLIC PANEL WITH GFR
BUN / CREAT RATIO: 19 (calc) (ref 6–22)
BUN: 26 mg/dL — AB (ref 7–25)
CHLORIDE: 108 mmol/L (ref 98–110)
CO2: 25 mmol/L (ref 20–32)
Calcium: 10.5 mg/dL — ABNORMAL HIGH (ref 8.6–10.4)
Creat: 1.38 mg/dL — ABNORMAL HIGH (ref 0.50–1.05)
GFR, Est African American: 48 mL/min/{1.73_m2} — ABNORMAL LOW (ref >=60)
GFR, Est Non African American: 42 mL/min/{1.73_m2} — ABNORMAL LOW (ref >=60)
GLUCOSE: 130 mg/dL — AB (ref 65–99)
Potassium: 4.3 mmol/L (ref 3.5–5.3)
Sodium: 141 mmol/L (ref 135–146)

## 2017-03-02 LAB — LIPID PANEL
CHOL/HDL RATIO: 3.5 (calc) (ref <5.0)
CHOLESTEROL: 168 mg/dL (ref <200)
HDL: 48 mg/dL — AB (ref >50)
LDL Cholesterol (Calc): 97 mg/dL (calc)
Non-HDL Cholesterol (Calc): 120 mg/dL (calc) (ref <130)
Triglycerides: 124 mg/dL (ref <150)

## 2017-03-02 LAB — CBC WITH DIFFERENTIAL/PLATELET
Basophils Absolute: 32 cells/uL (ref 0–200)
Basophils Relative: 0.3 %
Eosinophils Absolute: 11 cells/uL — ABNORMAL LOW (ref 15–500)
Eosinophils Relative: 0.1 %
HCT: 38.5 % (ref 35.0–45.0)
Hemoglobin: 13 g/dL (ref 11.7–15.5)
LYMPHS ABS: 961 {cells}/uL (ref 850–3900)
MCH: 31 pg (ref 27.0–33.0)
MCHC: 33.8 g/dL (ref 32.0–36.0)
MCV: 91.7 fL (ref 80.0–100.0)
MONOS PCT: 3.8 %
MPV: 11 fL (ref 7.5–12.5)
NEUTROS ABS: 9385 {cells}/uL — AB (ref 1500–7800)
NEUTROS PCT: 86.9 %
PLATELETS: 255 10*3/uL (ref 140–400)
RBC: 4.2 10*6/uL (ref 3.80–5.10)
RDW: 11.9 % (ref 11.0–15.0)
TOTAL LYMPHOCYTE: 8.9 %
WBC: 10.8 10*3/uL (ref 3.8–10.8)
WBCMIX: 410 {cells}/uL (ref 200–950)

## 2017-03-02 LAB — HEPATIC FUNCTION PANEL
AG Ratio: 1.4 (calc) (ref 1.0–2.5)
ALKALINE PHOSPHATASE (APISO): 112 U/L (ref 33–130)
ALT: 20 U/L (ref 6–29)
AST: 18 U/L (ref 10–35)
Albumin: 4.2 g/dL (ref 3.6–5.1)
Bilirubin, Direct: 0 mg/dL (ref 0.0–0.2)
GLOBULIN: 3.1 g/dL (ref 1.9–3.7)
Indirect Bilirubin: 0.3 mg/dL (calc) (ref 0.2–1.2)
TOTAL PROTEIN: 7.3 g/dL (ref 6.1–8.1)
Total Bilirubin: 0.3 mg/dL (ref 0.2–1.2)

## 2017-03-02 LAB — MAGNESIUM: Magnesium: 2 mg/dL (ref 1.5–2.5)

## 2017-03-02 LAB — TSH: TSH: 1.96 m[IU]/L (ref 0.40–4.50)

## 2017-03-03 ENCOUNTER — Ambulatory Visit (INDEPENDENT_AMBULATORY_CARE_PROVIDER_SITE_OTHER): Payer: Medicare Other | Admitting: Physician Assistant

## 2017-03-03 ENCOUNTER — Encounter: Payer: Self-pay | Admitting: Physician Assistant

## 2017-03-03 VITALS — BP 126/70 | HR 74 | Temp 97.6°F | Resp 14 | Ht 66.75 in | Wt 151.0 lb

## 2017-03-03 DIAGNOSIS — R350 Frequency of micturition: Secondary | ICD-10-CM

## 2017-03-03 MED ORDER — IBUPROFEN 400 MG PO TABS: 400.0000 mg | ORAL_TABLET | Freq: Four times a day (QID) | ORAL | 2 refills | Status: DC | PRN

## 2017-03-03 MED ORDER — SULFAMETHOXAZOLE-TRIMETHOPRIM 800-160 MG PO TABS: 1.0000 | ORAL_TABLET | Freq: Two times a day (BID) | ORAL | 0 refills | Status: DC

## 2017-03-03 MED ORDER — PHENAZOPYRIDINE HCL 100 MG PO TABS: 100.0000 mg | ORAL_TABLET | Freq: Three times a day (TID) | ORAL | 0 refills | Status: DC | PRN

## 2017-03-03 NOTE — Patient Instructions (Signed)

## 2017-03-03 NOTE — Progress Notes (Signed)
Subjective:    Patient ID: Mattilynn Forrer, female    DOB: 1955-05-10, 61 y.o.   MRN: [id number]  HPI 61 y.o. WF presents with urinary frequency.  For 2 nights has been peeing every 1-2 hours at night which is making her right leg hurt more, she should have PT coming out to her house. She also had 1 episode of vomiting yesterday. Some supra pubic discomfort but no dysuria, some incontinence, some lower back pain, no flank pain.  No fever, chills.    Continuing right leg pain, at thigh/knee, no swelling distally, neg homen's, no warmth, redness, tenderness. + TP tenderness.   Blood pressure 126/70, pulse 74, temperature 97.6 F (36.4 C), resp. rate 14, height 5' 6.75" (1.695 m), weight 151 lb (68.5 kg), SpO2 98 %.  Medications Current Outpatient Prescriptions on File Prior to Visit  Medication Sig  . baclofen (LIORESAL) 10 MG tablet Take 10 mg by mouth.  . Calcium Carbonate Antacid (TUMS PO) Take by mouth.  . Calcium Carbonate-Vitamin D 600-400 MG-UNIT tablet Take by mouth.  . Cholecalciferol (VITAMIN D3) 2000 units capsule Take by mouth.  . fexofenadine (ALLEGRA) 180 MG tablet Take 180 mg by mouth.  Marland Kitchen ibuprofen (ADVIL,MOTRIN) 400 MG tablet Take 400 mg by mouth.  . Multiple Vitamins-Minerals (MULTIVITAMIN WITH MINERALS) tablet Take by mouth.  . Omega-3 Fatty Acids (RA FISH OIL) 1000 MG CAPS Take by mouth.  . Potassium 99 MG TABS Take by mouth.  . topiramate (TOPAMAX) 200 MG tablet TAKE 1 TABLET BY MOUTH 2 TIMES DAILY, INCREASE TO 1 TABLET 3 TIMES DAILY AS DIRECTED  . traMADol (ULTRAM) 50 MG tablet Take 50 mg by mouth.  . vitamin B-12 (CYANOCOBALAMIN) 500 MCG tablet Take by mouth.  . vitamin C (ASCORBIC ACID) 500 MG tablet Take 500 mg by mouth.   No current facility-administered medications on file prior to visit.     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   Review of Systems  Constitutional: Negative for chills.  HENT: Negative.   Respiratory: Negative.   Cardiovascular: Negative.   Gastrointestinal: Negative.  Negative for nausea and vomiting.  Genitourinary: Positive for dysuria, frequency and urgency. Negative for decreased urine volume, difficulty urinating, dyspareunia, enuresis, flank pain, genital sores, hematuria, menstrual problem, pelvic pain, vaginal bleeding and vaginal discharge.       Objective:   Physical Exam  Constitutional: She is oriented to person, place, and time. She appears well-developed and well-nourished.  Neck: Normal range of motion. Neck supple.  Cardiovascular: Normal rate and regular rhythm.   Pulmonary/Chest: Effort normal and breath sounds normal.  Abdominal: Soft. Bowel sounds are normal. She exhibits no distension and no mass. There is tenderness. There is no rebound and no guarding.  Musculoskeletal: Normal range of motion. She exhibits no tenderness.  Neurological: She is alert and oriented to person, place, and time.  Skin: Skin is warm and dry.       Assessment & Plan:   Urinary frequency -     Urinalysis, Routine w reflex microscopic -     Urine Culture -     sulfamethoxazole-trimethoprim (BACTRIM DS,SEPTRA DS) 800-160 MG tablet; Take 1 tablet by mouth 2 (two) times daily. -     phenazopyridine (PYRIDIUM) 100 MG tablet; Take 1 tablet (100 mg total) by mouth 3 (three) times daily  as needed for pain (frequent urination). -     ibuprofen (ADVIL,MOTRIN) 400 MG tablet; Take 1 tablet (400 mg total) by mouth every 6 (six) hours as needed for moderate pain.

## 2017-03-04 ENCOUNTER — Telehealth: Payer: Self-pay

## 2017-03-04 LAB — URINE CULTURE
MICRO NUMBER: 81228334
SPECIMEN QUALITY: ADEQUATE

## 2017-03-04 LAB — URINALYSIS, ROUTINE W REFLEX MICROSCOPIC
BACTERIA UA: NONE SEEN /HPF
BILIRUBIN URINE: NEGATIVE
Glucose, UA: NEGATIVE
Hgb urine dipstick: NEGATIVE
Hyaline Cast: NONE SEEN /LPF
KETONES UR: NEGATIVE
LEUKOCYTES UA: NEGATIVE
Nitrite: NEGATIVE
RBC / HPF: NONE SEEN /HPF (ref 0–2)
SPECIFIC GRAVITY, URINE: 1.016 (ref 1.001–1.03)
WBC, UA: NONE SEEN /HPF (ref 0–5)
pH: >=8.5 — AB (ref 5.0–8.0)

## 2017-03-04 NOTE — Telephone Encounter (Signed)
Pt has been in informed that it's ok to take BACTRIM.  Per pt she has started taking the BACTRIM last night & she states she is feeling a little better.

## 2017-03-04 NOTE — Telephone Encounter (Signed)
Pt reports that she read the side effects of Bactrim & it said that it can cause seizures. Said she has had seizures since she was [age] but her last one was in 1998. Please advise.  Per provider it should be okay to start the BACTRIM.  LVM for return office call 2nd Nov 2018 @ 8:40am DD

## 2017-03-07 NOTE — Progress Notes (Signed)
LVM for pt to return office call for LAB results.

## 2017-03-09 ENCOUNTER — Telehealth: Payer: Self-pay | Admitting: *Deleted

## 2017-03-09 DIAGNOSIS — E782 Mixed hyperlipidemia: Secondary | ICD-10-CM | POA: Diagnosis not present

## 2017-03-09 DIAGNOSIS — E538 Deficiency of other specified B group vitamins: Secondary | ICD-10-CM | POA: Diagnosis not present

## 2017-03-09 DIAGNOSIS — G40219 Localization-related (focal) (partial) symptomatic epilepsy and epileptic syndromes with complex partial seizures, intractable, without status epilepticus: Secondary | ICD-10-CM | POA: Diagnosis not present

## 2017-03-09 DIAGNOSIS — I69351 Hemiplegia and hemiparesis following cerebral infarction affecting right dominant side: Secondary | ICD-10-CM | POA: Diagnosis not present

## 2017-03-09 DIAGNOSIS — I10 Essential (primary) hypertension: Secondary | ICD-10-CM | POA: Diagnosis not present

## 2017-03-09 DIAGNOSIS — Z9181 History of falling: Secondary | ICD-10-CM | POA: Diagnosis not present

## 2017-03-09 DIAGNOSIS — M25561 Pain in right knee: Secondary | ICD-10-CM | POA: Diagnosis not present

## 2017-03-09 DIAGNOSIS — R51 Headache: Secondary | ICD-10-CM | POA: Diagnosis not present

## 2017-03-09 DIAGNOSIS — I69398 Other sequelae of cerebral infarction: Secondary | ICD-10-CM | POA: Diagnosis not present

## 2017-03-09 DIAGNOSIS — R2689 Other abnormalities of gait and mobility: Secondary | ICD-10-CM | POA: Diagnosis not present

## 2017-03-09 NOTE — Telephone Encounter (Signed)
Leroy Kennedy, PT with [facility], called and requested a referral for a social worker to see the patient.  She also requested an order to continue the patient's PT 2 days a week x 4 weeks.  The orders were approved by Dr Melford Aase and a message was left to inform the therapist.

## 2017-03-15 DIAGNOSIS — I69351 Hemiplegia and hemiparesis following cerebral infarction affecting right dominant side: Secondary | ICD-10-CM | POA: Diagnosis not present

## 2017-03-15 DIAGNOSIS — M25561 Pain in right knee: Secondary | ICD-10-CM | POA: Diagnosis not present

## 2017-03-15 DIAGNOSIS — I10 Essential (primary) hypertension: Secondary | ICD-10-CM | POA: Diagnosis not present

## 2017-03-15 DIAGNOSIS — I69398 Other sequelae of cerebral infarction: Secondary | ICD-10-CM | POA: Diagnosis not present

## 2017-03-15 DIAGNOSIS — G40219 Localization-related (focal) (partial) symptomatic epilepsy and epileptic syndromes with complex partial seizures, intractable, without status epilepticus: Secondary | ICD-10-CM | POA: Diagnosis not present

## 2017-03-15 DIAGNOSIS — R2689 Other abnormalities of gait and mobility: Secondary | ICD-10-CM | POA: Diagnosis not present

## 2017-03-17 DIAGNOSIS — R2689 Other abnormalities of gait and mobility: Secondary | ICD-10-CM | POA: Diagnosis not present

## 2017-03-17 DIAGNOSIS — I69398 Other sequelae of cerebral infarction: Secondary | ICD-10-CM | POA: Diagnosis not present

## 2017-03-17 DIAGNOSIS — I10 Essential (primary) hypertension: Secondary | ICD-10-CM | POA: Diagnosis not present

## 2017-03-17 DIAGNOSIS — G40219 Localization-related (focal) (partial) symptomatic epilepsy and epileptic syndromes with complex partial seizures, intractable, without status epilepticus: Secondary | ICD-10-CM | POA: Diagnosis not present

## 2017-03-17 DIAGNOSIS — I69351 Hemiplegia and hemiparesis following cerebral infarction affecting right dominant side: Secondary | ICD-10-CM | POA: Diagnosis not present

## 2017-03-17 DIAGNOSIS — M25561 Pain in right knee: Secondary | ICD-10-CM | POA: Diagnosis not present

## 2017-03-22 ENCOUNTER — Encounter: Payer: Self-pay | Admitting: *Deleted

## 2017-03-22 DIAGNOSIS — M25561 Pain in right knee: Secondary | ICD-10-CM | POA: Diagnosis not present

## 2017-03-22 DIAGNOSIS — I10 Essential (primary) hypertension: Secondary | ICD-10-CM | POA: Diagnosis not present

## 2017-03-22 DIAGNOSIS — R2689 Other abnormalities of gait and mobility: Secondary | ICD-10-CM | POA: Diagnosis not present

## 2017-03-22 DIAGNOSIS — I69398 Other sequelae of cerebral infarction: Secondary | ICD-10-CM | POA: Diagnosis not present

## 2017-03-22 DIAGNOSIS — I69351 Hemiplegia and hemiparesis following cerebral infarction affecting right dominant side: Secondary | ICD-10-CM | POA: Diagnosis not present

## 2017-03-22 DIAGNOSIS — G40219 Localization-related (focal) (partial) symptomatic epilepsy and epileptic syndromes with complex partial seizures, intractable, without status epilepticus: Secondary | ICD-10-CM | POA: Diagnosis not present

## 2017-03-25 DIAGNOSIS — I69398 Other sequelae of cerebral infarction: Secondary | ICD-10-CM | POA: Diagnosis not present

## 2017-03-25 DIAGNOSIS — I10 Essential (primary) hypertension: Secondary | ICD-10-CM | POA: Diagnosis not present

## 2017-03-25 DIAGNOSIS — R2689 Other abnormalities of gait and mobility: Secondary | ICD-10-CM | POA: Diagnosis not present

## 2017-03-25 DIAGNOSIS — G40219 Localization-related (focal) (partial) symptomatic epilepsy and epileptic syndromes with complex partial seizures, intractable, without status epilepticus: Secondary | ICD-10-CM | POA: Diagnosis not present

## 2017-03-25 DIAGNOSIS — M25561 Pain in right knee: Secondary | ICD-10-CM | POA: Diagnosis not present

## 2017-03-25 DIAGNOSIS — I69351 Hemiplegia and hemiparesis following cerebral infarction affecting right dominant side: Secondary | ICD-10-CM | POA: Diagnosis not present

## 2017-03-29 DIAGNOSIS — I10 Essential (primary) hypertension: Secondary | ICD-10-CM | POA: Diagnosis not present

## 2017-03-29 DIAGNOSIS — G40219 Localization-related (focal) (partial) symptomatic epilepsy and epileptic syndromes with complex partial seizures, intractable, without status epilepticus: Secondary | ICD-10-CM | POA: Diagnosis not present

## 2017-03-29 DIAGNOSIS — R2689 Other abnormalities of gait and mobility: Secondary | ICD-10-CM | POA: Diagnosis not present

## 2017-03-29 DIAGNOSIS — I69351 Hemiplegia and hemiparesis following cerebral infarction affecting right dominant side: Secondary | ICD-10-CM | POA: Diagnosis not present

## 2017-03-29 DIAGNOSIS — I69398 Other sequelae of cerebral infarction: Secondary | ICD-10-CM | POA: Diagnosis not present

## 2017-03-29 DIAGNOSIS — M25561 Pain in right knee: Secondary | ICD-10-CM | POA: Diagnosis not present

## 2017-03-31 DIAGNOSIS — G40219 Localization-related (focal) (partial) symptomatic epilepsy and epileptic syndromes with complex partial seizures, intractable, without status epilepticus: Secondary | ICD-10-CM | POA: Diagnosis not present

## 2017-03-31 DIAGNOSIS — I69351 Hemiplegia and hemiparesis following cerebral infarction affecting right dominant side: Secondary | ICD-10-CM | POA: Diagnosis not present

## 2017-03-31 DIAGNOSIS — R2689 Other abnormalities of gait and mobility: Secondary | ICD-10-CM | POA: Diagnosis not present

## 2017-03-31 DIAGNOSIS — M25561 Pain in right knee: Secondary | ICD-10-CM | POA: Diagnosis not present

## 2017-03-31 DIAGNOSIS — I10 Essential (primary) hypertension: Secondary | ICD-10-CM | POA: Diagnosis not present

## 2017-03-31 DIAGNOSIS — I69398 Other sequelae of cerebral infarction: Secondary | ICD-10-CM | POA: Diagnosis not present

## 2017-04-05 DIAGNOSIS — R2689 Other abnormalities of gait and mobility: Secondary | ICD-10-CM | POA: Diagnosis not present

## 2017-04-05 DIAGNOSIS — M25561 Pain in right knee: Secondary | ICD-10-CM | POA: Diagnosis not present

## 2017-04-05 DIAGNOSIS — I10 Essential (primary) hypertension: Secondary | ICD-10-CM | POA: Diagnosis not present

## 2017-04-05 DIAGNOSIS — G40219 Localization-related (focal) (partial) symptomatic epilepsy and epileptic syndromes with complex partial seizures, intractable, without status epilepticus: Secondary | ICD-10-CM | POA: Diagnosis not present

## 2017-04-05 DIAGNOSIS — I69351 Hemiplegia and hemiparesis following cerebral infarction affecting right dominant side: Secondary | ICD-10-CM | POA: Diagnosis not present

## 2017-04-05 DIAGNOSIS — I69398 Other sequelae of cerebral infarction: Secondary | ICD-10-CM | POA: Diagnosis not present

## 2017-04-06 ENCOUNTER — Encounter: Payer: Self-pay | Admitting: *Deleted

## 2017-04-14 DIAGNOSIS — G40219 Localization-related (focal) (partial) symptomatic epilepsy and epileptic syndromes with complex partial seizures, intractable, without status epilepticus: Secondary | ICD-10-CM | POA: Diagnosis not present

## 2017-04-14 DIAGNOSIS — I69351 Hemiplegia and hemiparesis following cerebral infarction affecting right dominant side: Secondary | ICD-10-CM | POA: Diagnosis not present

## 2017-04-14 DIAGNOSIS — I10 Essential (primary) hypertension: Secondary | ICD-10-CM | POA: Diagnosis not present

## 2017-04-14 DIAGNOSIS — R2689 Other abnormalities of gait and mobility: Secondary | ICD-10-CM | POA: Diagnosis not present

## 2017-04-14 DIAGNOSIS — I69398 Other sequelae of cerebral infarction: Secondary | ICD-10-CM | POA: Diagnosis not present

## 2017-04-14 DIAGNOSIS — M25561 Pain in right knee: Secondary | ICD-10-CM | POA: Diagnosis not present

## 2017-04-19 DIAGNOSIS — R2689 Other abnormalities of gait and mobility: Secondary | ICD-10-CM | POA: Diagnosis not present

## 2017-04-19 DIAGNOSIS — G40219 Localization-related (focal) (partial) symptomatic epilepsy and epileptic syndromes with complex partial seizures, intractable, without status epilepticus: Secondary | ICD-10-CM | POA: Diagnosis not present

## 2017-04-19 DIAGNOSIS — I10 Essential (primary) hypertension: Secondary | ICD-10-CM | POA: Diagnosis not present

## 2017-04-19 DIAGNOSIS — I69398 Other sequelae of cerebral infarction: Secondary | ICD-10-CM | POA: Diagnosis not present

## 2017-04-19 DIAGNOSIS — M25561 Pain in right knee: Secondary | ICD-10-CM | POA: Diagnosis not present

## 2017-04-19 DIAGNOSIS — I69351 Hemiplegia and hemiparesis following cerebral infarction affecting right dominant side: Secondary | ICD-10-CM | POA: Diagnosis not present

## 2017-04-29 ENCOUNTER — Other Ambulatory Visit: Payer: Self-pay | Admitting: Internal Medicine

## 2017-05-04 NOTE — Progress Notes (Signed)
Assessment and Plan:  Diagnoses and all orders for this visit:  Current mild episode of major depressive disorder without prior episode ([place]) Start new medication as prescribed Stress management techniques discussed, increase water, good sleep hygiene discussed, increase exercise, and increase veggies.  Follow up 1 month, call the office if any new AE's from medications and we will switch them -     escitalopram (LEXAPRO) 10 MG tablet; Take 1 tablet (10 mg total) by mouth daily.  Further disposition pending results of labs. Discussed med's effects and SE's.   Over 15 minutes of exam, counseling, chart review, and critical decision making was performed.   Future Appointments  Date Time Provider [place]  06/01/2017  2:30 PM Unk Pinto, MD [facility] None    ------------------------------------------------------------------------------------------------------------------   HPI BP 120/80   Pulse 76   Temp (!) 97.5 F (36.4 C)   Ht 5' 6.75" (1.695 m)   Wt 149 lb (67.6 kg)   SpO2 97%   BMI 23.51 kg/m   61 y.o.female presents for general malaise, sleeping more than usual, feeling down. She reports current decline in mood is ongoing since she fell on 11/22 - did not hit her head, but "hooked" her R leg on the way down. She has hx of bleed in brain as infant, seizures, last in 1998 - well controlled on antiseizure medication; she also has hx of multiple CVAs with multiple deficits - R visual field deficits and weakness. She denies any increase/change in these deficits, but reports she is "still not feeling better" - she reports she is slower than usual, sleeping more, feeling down- she is tearful today, and starts sharing about very stressful life. She is currently living with her uncle and are having frequent arguments, her mother passed away this past year, and she would like to find a new living situation but has not been able to get in secondary to age.   Past Medical  History:  Diagnosis Date  . AVM (arteriovenous malformation) brain   . 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 Aspirin Other (See Comments)    CONTRAINDICATED DUE TO HISTORY OF BLEEDING IN BRAIN    Current Outpatient Medications on File Prior to Visit  Medication Sig  . baclofen (LIORESAL) 10 MG tablet Take 10 mg by mouth.  . butalbital-acetaminophen-caffeine (FIORICET, ESGIC) 50-325-40 MG tablet Take by mouth 2 (two) times daily as needed for headache.  . Calcium Carbonate Antacid (TUMS PO) Take by mouth.  . Calcium Carbonate-Vitamin D 600-400 MG-UNIT tablet Take by mouth.  . Cholecalciferol (VITAMIN D3) 2000 units capsule Take by mouth.  . fexofenadine (ALLEGRA) 180 MG tablet Take 180 mg by mouth.  Marland Kitchen ibuprofen (ADVIL,MOTRIN) 400 MG tablet Take 1 tablet (400 mg total) by mouth every 6 (six) hours as needed for moderate pain.  . Multiple Vitamins-Minerals (MULTIVITAMIN WITH MINERALS) tablet Take by mouth.  . Omega-3 Fatty Acids (RA FISH OIL) 1000 MG CAPS Take by mouth.  . phenazopyridine (PYRIDIUM) 100 MG tablet Take 1 tablet (100 mg total) by mouth 3 (three) times daily as needed for pain (frequent urination).  . Potassium 99 MG TABS Take by mouth.  . topiramate (TOPAMAX) 200 MG tablet TAKE 1 TABLET BY MOUTH 2 TIMES DAILY, INCREASE TO 1 TABLET 3 TIMES DAILY AS DIRECTED  . vitamin B-12 (CYANOCOBALAMIN) 500 MCG tablet Take by mouth.  . vitamin C (ASCORBIC ACID) 500 MG tablet  Take 500 mg by mouth.  . sulfamethoxazole-trimethoprim (BACTRIM DS,SEPTRA DS) 800-160 MG tablet Take 1 tablet by mouth 2 (two) times daily.  . traMADol (ULTRAM) 50 MG tablet Take 50 mg by mouth.   No current facility-administered medications on file prior to visit.     ROS: all negative except above.   Physical Exam:  BP 120/80   Pulse 76   Temp (!) 97.5 F (36.4 C)   Ht 5' 6.75" (1.695 m)   Wt 149 lb (67.6 kg)   SpO2 97%   BMI 23.51 kg/m    General Appearance: Well nourished, in no apparent distress. Eyes: PERRLA, conjunctiva no swelling or erythema Sinuses: No Frontal/maxillary tenderness ENT/Mouth: Ext aud canals clear, TMs without erythema, bulging. No erythema, swelling, or exudate on post pharynx.  Tonsils not swollen or erythematous. Hearing normal.  Neck: Supple, thyroid normal.  Respiratory: Respiratory effort normal, BS equal bilaterally without rales, rhonchi, wheezing or stridor.  Cardio: RRR with no MRGs. Brisk peripheral pulses without edema.  Abdomen: Soft, + BS.  Non tender, no guarding, rebound, hernias, masses. Lymphatics: Non tender without lymphadenopathy.  Musculoskeletal: Significant weakness of R upper and lower extremities, walks slowly with cane Skin: Warm, dry without rashes, lesions, ecchymosis.  Psych: Awake and oriented X 3, tearful affect, Insight and Judgment appropriate.    Izora Ribas, NP 9:48 AM [facility]

## 2017-05-05 ENCOUNTER — Ambulatory Visit (INDEPENDENT_AMBULATORY_CARE_PROVIDER_SITE_OTHER): Payer: Medicare Other | Admitting: Adult Health

## 2017-05-05 ENCOUNTER — Encounter: Payer: Self-pay | Admitting: Adult Health

## 2017-05-05 VITALS — BP 120/80 | HR 76 | Temp 97.5°F | Ht 66.75 in | Wt 149.0 lb

## 2017-05-05 DIAGNOSIS — F320 Major depressive disorder, single episode, mild: Secondary | ICD-10-CM

## 2017-05-05 MED ORDER — ESCITALOPRAM OXALATE 10 MG PO TABS: 10.0000 mg | ORAL_TABLET | Freq: Every day | ORAL | 2 refills | Status: DC

## 2017-05-05 NOTE — Patient Instructions (Addendum)
[facility]  7 a.m.-6:30 p.m., Monday 7 a.m.-5 p.m., Tuesday-Friday Schedule an appointment by calling [phone].  [facility] Schedule an appointment by calling [phone].    Escitalopram tablets What is this medicine? ESCITALOPRAM (es sye TAL oh pram) is used to treat depression and certain types of anxiety. This medicine may be used for other purposes; ask your health care provider or pharmacist if you have questions. COMMON BRAND NAME(S): Lexapro What should I tell my health care provider before I take this medicine? They need to know if you have any of these conditions: -bipolar disorder or a family history of bipolar disorder -diabetes -glaucoma -heart disease -kidney or liver disease -receiving electroconvulsive therapy -seizures (convulsions) -suicidal thoughts, plans, or attempt by you or a family member -an unusual or allergic reaction to escitalopram, the related drug citalopram, other medicines, foods, dyes, or preservatives -pregnant or trying to become pregnant -breast-feeding How should I use this medicine? Take this medicine by mouth with a glass of water. Follow the directions on the prescription label. You can take it with or without food. If it upsets your stomach, take it with food. Take your medicine at regular intervals. Do not take it more often than directed. Do not stop taking this medicine suddenly except upon the advice of your doctor. Stopping this medicine too quickly may cause serious side effects or your condition may worsen. A special MedGuide will be given to you by the pharmacist with each prescription and refill. Be sure to read this information carefully each time. Talk to your pediatrician regarding the use of this medicine in children. Special care may be needed. Overdosage: If you think you have taken too much of this medicine contact a poison control center or emergency room at once. NOTE: This medicine  is only for you. Do not share this medicine with others. What if I miss a dose? If you miss a dose, take it as soon as you can. If it is almost time for your next dose, take only that dose. Do not take double or extra doses. What may interact with this medicine? Do not take this medicine with any of the following medications: -certain medicines for fungal infections like fluconazole, itraconazole, ketoconazole, posaconazole, voriconazole -cisapride -citalopram -dofetilide -dronedarone -linezolid -MAOIs like Carbex, Eldepryl, Marplan, Nardil, and Parnate -methylene blue (injected into a vein) -pimozide -thioridazine -ziprasidone This medicine may also interact with the following medications: -alcohol -amphetamines -aspirin and aspirin-like medicines -carbamazepine -certain medicines for depression, anxiety, or psychotic disturbances -certain medicines for migraine headache like almotriptan, eletriptan, frovatriptan, naratriptan, rizatriptan, sumatriptan, zolmitriptan -certain medicines for sleep -certain medicines that treat or prevent blood clots like warfarin, enoxaparin, dalteparin -cimetidine -diuretics -fentanyl -furazolidone -isoniazid -lithium -metoprolol -NSAIDs, medicines for pain and inflammation, like ibuprofen or naproxen -other medicines that prolong the QT interval (cause an abnormal heart rhythm) -procarbazine -rasagiline -supplements like St. John's wort, kava kava, valerian -tramadol -tryptophan This list may not describe all possible interactions. Give your health care provider a list of all the medicines, herbs, non-prescription drugs, or dietary supplements you use. Also tell them if you smoke, drink alcohol, or use illegal drugs. Some items may interact with your medicine. What should I watch for while using this medicine? Tell your doctor if your symptoms do not get better or if they get worse. Visit your doctor or health care professional for regular  checks on your progress. Because it may take several weeks to see the full effects of  this medicine, it is important to continue your treatment as prescribed by your doctor. Patients and their families should watch out for new or worsening thoughts of suicide or depression. Also watch out for sudden changes in feelings such as feeling anxious, agitated, panicky, irritable, hostile, aggressive, impulsive, severely restless, overly excited and hyperactive, or not being able to sleep. If this happens, especially at the beginning of treatment or after a change in dose, call your health care professional. Dennis Bast may get drowsy or dizzy. Do not drive, use machinery, or do anything that needs mental alertness until you know how this medicine affects you. Do not stand or sit up quickly, especially if you are an older patient. This reduces the risk of dizzy or fainting spells. Alcohol may interfere with the effect of this medicine. Avoid alcoholic drinks. Your mouth may get dry. Chewing sugarless gum or sucking hard candy, and drinking plenty of water may help. Contact your doctor if the problem does not go away or is severe. What side effects may I notice from receiving this medicine? Side effects that you should report to your doctor or health care professional as soon as possible: -allergic reactions like skin rash, itching or hives, swelling of the face, lips, or tongue -anxious -black, tarry stools -changes in vision -confusion -elevated mood, decreased need for sleep, racing thoughts, impulsive behavior -eye pain -fast, irregular heartbeat -feeling faint or lightheaded, falls -feeling agitated, angry, or irritable -hallucination, loss of contact with reality -loss of balance or coordination -loss of memory -painful or prolonged erections -restlessness, pacing, inability to keep still -seizures -stiff muscles -suicidal thoughts or other mood changes -trouble sleeping -unusual bleeding or  bruising -unusually weak or tired -vomiting Side effects that usually do not require medical attention (report to your doctor or health care professional if they continue or are bothersome): -changes in appetite -change in sex drive or performance -headache -increased sweating -indigestion, nausea -tremors This list may not describe all possible side effects. Call your doctor for medical advice about side effects. You may report side effects to FDA at 1-800-FDA-1088. Where should I keep my medicine? Keep out of reach of children. Store at room temperature between 15 and 30 degrees C (59 and 86 degrees F). Throw away any unused medicine after the expiration date. NOTE: This sheet is a summary. It may not cover all possible information. If you have questions about this medicine, talk to your doctor, pharmacist, or health care provider.  2018 Elsevier/Gold Standard (2015-09-22 13:20:23)

## 2017-06-01 ENCOUNTER — Ambulatory Visit (INDEPENDENT_AMBULATORY_CARE_PROVIDER_SITE_OTHER): Payer: Medicare Other | Admitting: Internal Medicine

## 2017-06-01 ENCOUNTER — Encounter: Payer: Self-pay | Admitting: Internal Medicine

## 2017-06-01 VITALS — BP 114/74 | HR 64 | Temp 97.5°F | Resp 16 | Ht 66.75 in | Wt 149.0 lb

## 2017-06-01 DIAGNOSIS — Z79899 Other long term (current) drug therapy: Secondary | ICD-10-CM

## 2017-06-01 DIAGNOSIS — E782 Mixed hyperlipidemia: Secondary | ICD-10-CM | POA: Insufficient documentation

## 2017-06-01 DIAGNOSIS — R7309 Other abnormal glucose: Secondary | ICD-10-CM | POA: Diagnosis not present

## 2017-06-01 DIAGNOSIS — Z91199 Patient's noncompliance with other medical treatment and regimen due to unspecified reason: Secondary | ICD-10-CM

## 2017-06-01 DIAGNOSIS — E559 Vitamin D deficiency, unspecified: Secondary | ICD-10-CM | POA: Insufficient documentation

## 2017-06-01 DIAGNOSIS — R0989 Other specified symptoms and signs involving the circulatory and respiratory systems: Secondary | ICD-10-CM | POA: Diagnosis not present

## 2017-06-01 DIAGNOSIS — Z9119 Patient's noncompliance with other medical treatment and regimen: Secondary | ICD-10-CM

## 2017-06-01 NOTE — Progress Notes (Signed)
This very nice 62 y.o. Single WF presents for recommended follow up with HTN screening, Hyperlipidemia, Pre-Diabetes/Insulin resistance and Vitamin D Deficiency.      Patient has hx/o seizure disorder predating to age 68 yo and Brain AVM at age 64 yo with a 1st stroke at age 33 (59)  And 3 more strokes by age 41 yo (50) when she had he 1st surg for AVM. Patient has a spastic Rt HP. Her last seizure reportedly was circa 1988.          Patient was seen recently for depressive complaints and was prescribed g Lexapro, but upon advice of her Pharmacist or representative & for fear of increased suicidality  & seizure potential, she never took the prescribed medication. Also,she seems somewhat confrontational today exclaiming that she does not need regular check-up's and also refused to have recommended blood tests today.      Patient is monitored expectantly for labile  HTN & BP has been controlled at home. Today's BP is at goal - 114/74. Patient has had no complaints of any cardiac type chest pain, palpitations, dyspnea / orthopnea / PND, dizziness, claudication, or dependent edema.     Patient has hx/o elevated Trig's now better controlled with diet.  Last Lipids were at goal.  Lab Results  Component Value Date   CHOL 168 03/01/2017   HDL 48 (L) 03/01/2017   LDLCALC 68 11/15/2016   TRIG 124 03/01/2017   CHOLHDL 3.5 03/01/2017      Also, the patient has history of PreDiabetes/Insulin resistance with elevated insulin level and today denies symptoms of reactive hypoglycemia, diabetic polys, paresthesias or visual blurring.  Last A1c was 5.4% with elevated insulin 27.5.     Further, the patient also has history of Vitamin D Deficiency and supplements vitamin D without any suspected side-effects. Last vitamin D was at goal: Lab Results  Component Value Date   VD25OH 74 11/15/2016   Current Outpatient Medications on File Prior to Visit  Medication Sig  . baclofen (LIORESAL) 10 MG tablet Take  10 mg by mouth.  . butalbital-acetaminophen-caffeine (FIORICET, ESGIC) 50-325-40 MG tablet Take by mouth 2 (two) times daily as needed for headache.  . Calcium Carbonate Antacid (TUMS PO) Take by mouth.  . Calcium Carbonate-Vitamin D 600-400 MG-UNIT tablet Take by mouth.  . Cholecalciferol (VITAMIN D3) 2000 units capsule Take by mouth 2 (two) times daily.   . fexofenadine (ALLEGRA) 180 MG tablet Take 180 mg by mouth.  Marland Kitchen ibuprofen (ADVIL,MOTRIN) 400 MG tablet Take 1 tablet (400 mg total) by mouth every 6 (six) hours as needed for moderate pain.  . Multiple Vitamins-Minerals (MULTIVITAMIN WITH MINERALS) tablet Take by mouth.  . Omega-3 Fatty Acids (RA FISH OIL) 1000 MG CAPS Take by mouth.  . Potassium 99 MG TABS Take by mouth.  . topiramate (TOPAMAX) 200 MG tablet TAKE 1 TABLET BY MOUTH 2 TIMES DAILY, INCREASE TO 1 TABLET 3 TIMES DAILY AS DIRECTED  . vitamin C (ASCORBIC ACID) 500 MG tablet Take 500 mg by mouth.   No current facility-administered medications on file prior to visit.    Allergies  Allergen Reactions  . Aspirin Other (See Comments)    CONTRAINDICATED DUE TO HISTORY OF BLEEDING IN BRAIN   PMHx:   Past Medical History:  Diagnosis Date  . AVM (arteriovenous malformation) brain   . 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  Influenza-Unspecified 03/01/2017  . Pneumococcal-Unspecified 05/03/2004  . Tdap 08/11/2009  . Zoster 04/14/2012   Past Surgical History:  Procedure Laterality Date  . BRAIN SURGERY     FHx:    Reviewed / unchanged  SHx:    Reviewed / unchanged   Systems Review:  Constitutional: Denies fever, chills, wt changes, headaches, insomnia, fatigue, night sweats, change in appetite. Eyes: Denies redness, blurred vision, diplopia, discharge, itchy, watery eyes.  ENT: Denies discharge, congestion, post nasal drip, epistaxis, sore throat, earache, hearing loss, dental pain,  tinnitus, vertigo, sinus pain, snoring.  CV: Denies chest pain, palpitations, irregular heartbeat, syncope, dyspnea, diaphoresis, orthopnea, PND, claudication or edema. Respiratory: denies cough, dyspnea, DOE, pleurisy, hoarseness, laryngitis, wheezing.  Gastrointestinal: Denies dysphagia, odynophagia, heartburn, reflux, water brash, abdominal pain or cramps, nausea, vomiting, bloating, diarrhea, constipation, hematemesis, melena, hematochezia  or hemorrhoids. Genitourinary: Denies dysuria, frequency, urgency, nocturia, hesitancy, discharge, hematuria or flank pain. Musculoskeletal: Denies arthralgias, myalgias, stiffness, jt. swelling, pain, limping or strain/sprain.  Skin: Denies pruritus, rash, hives, warts, acne, eczema or change in skin lesion(s). Neuro: No weakness, tremor, incoordination, spasms, paresthesia or pain. Psychiatric: Denies confusion, memory loss or sensory loss. Endo: Denies change in weight, skin or hair change.  Heme/Lymph: No excessive bleeding, bruising or enlarged lymph nodes.  Physical Exam  BP 114/74   Pulse 64   Temp (!) 97.5 F (36.4 C)   Resp 16   Ht 5' 6.75" (1.695 m)   Wt 149 lb (67.6 kg)   BMI 23.51 kg/m   Appears well nourished, well groomed  and in no distress.  Eyes: PERRLA, EOMs, conjunctiva no swelling or erythema. Sinuses: No frontal/maxillary tenderness ENT/Mouth: EAC's clear, TM's nl w/o erythema, bulging. Nares clear w/o erythema, swelling, exudates. Oropharynx clear without erythema or exudates. Oral hygiene is good. Tongue normal, non obstructing. Hearing intact.  Neck: Supple. Thyroid nl. Car 2+/2+ without bruits, nodes or JVD. Chest: Respirations nl with BS clear & equal w/o rales, rhonchi, wheezing or stridor.  Cor: Heart sounds normal w/ regular rate and rhythm without sig. murmurs, gallops, clicks or rubs. Peripheral pulses normal and equal  without edema.  Abdomen: Soft & bowel sounds normal. Non-tender w/o guarding, rebound, hernias,  masses or organomegaly.  Lymphatics: Unremarkable.  Musculoskeletal: Rt Hp with limping gait. Has quad cane.  Skin: Warm, dry without exposed rashes, lesions or ecchymosis apparent.  Neuro: Cranial nerves intact, reflexes equal bilaterally. Sensory-motor testing grossly intact. Tendon reflexes grossly intact.  Pysch: Alert & oriented x 3.  Insight and judgement poor.   Assessment and Plan:  1. Labile hypertension  -  monitor blood pressure at home.  - Recommended DASH diet. Reminder to go to the ER if any CP,  SOB, nausea, dizziness, severe HA, changes vision/speech.  2. Hyperlipidemia, mixed  - Continue diet & lifestyle modifications.   3. Abnormal glucose/InsulinResistance   4. Vitamin D deficiency  - Continue supplementation.  5. Medication management   6. Poor compliance  - Explained importance or regular and periodic OV's to monitor for her abnormal kidney function (Stage 3 CKD, seizure meds, hx/o elevated lipids, and elevated insulin levels,        Discussed  regular exercise, BP monitoring and discussed med and SE's. Recommended labs to assess and monitor clinical status with  Declining labs today. Over 30 minutes of exam, counseling, chart review was performed.

## 2017-06-01 NOTE — Patient Instructions (Signed)

## 2017-06-07 ENCOUNTER — Other Ambulatory Visit: Payer: Self-pay | Admitting: Internal Medicine

## 2017-06-07 DIAGNOSIS — Z1231 Encounter for screening mammogram for malignant neoplasm of breast: Secondary | ICD-10-CM

## 2017-06-08 ENCOUNTER — Encounter: Payer: Self-pay | Admitting: Adult Health

## 2017-06-08 ENCOUNTER — Ambulatory Visit (INDEPENDENT_AMBULATORY_CARE_PROVIDER_SITE_OTHER): Payer: Medicare Other | Admitting: Adult Health

## 2017-06-08 VITALS — BP 116/70 | HR 76 | Temp 97.3°F | Ht 66.75 in | Wt 149.0 lb

## 2017-06-08 DIAGNOSIS — L988 Other specified disorders of the skin and subcutaneous tissue: Secondary | ICD-10-CM

## 2017-06-08 DIAGNOSIS — N649 Disorder of breast, unspecified: Secondary | ICD-10-CM

## 2017-06-08 NOTE — Patient Instructions (Signed)
[facility]  7 a.m.-6:30 p.m., Monday 7 a.m.-5 p.m., Tuesday-Friday Schedule an appointment by calling [phone].  [facility] Schedule an appointment by calling [phone].   Say NO to all questions    Mammogram A mammogram is an X-ray of the breasts that is done to check for abnormal changes. This procedure can screen for and detect any changes that may suggest breast cancer. A mammogram can also identify other changes and variations in the breast, such as:  Inflammation of the breast tissue (mastitis).  An infected area that contains a collection of pus (abscess).  A fluid-filled sac (cyst).  Fibrocystic changes. This is when breast tissue becomes denser, which can make the tissue feel rope-like or uneven under the skin.  Tumors that are not cancerous (benign).  Tell a health care provider about:  Any allergies you have.  If you have breast implants.  If you have had previous breast disease, biopsy, or surgery.  If you are breastfeeding.  Any possibility that you could be pregnant, if this applies.  If you are younger than age 48.  If you have a family history of breast cancer. What are the risks? Generally, this is a safe procedure. However, problems may occur, including:  Exposure to radiation. Radiation levels are very low with this test.  The results being misinterpreted.  The need for further tests.  The inability of the mammogram to detect certain cancers.  What happens before the procedure?  Schedule your test about 1-2 weeks after your menstrual period. This is usually when your breasts are the least tender.  If you have had a mammogram done at a different facility in the past, get the mammogram X-rays or have them sent to your current exam facility in order to compare them.  Wash your breasts and under your arms the day of the test.  Do not wear deodorants, perfumes, lotions, or powders anywhere on your  body on the day of the test.  Remove any jewelry from your neck.  Wear clothes that you can change into and out of easily. What happens during the procedure?  You will undress from the waist up and put on a gown.  You will stand in front of the X-ray machine.  Each breast will be placed between two plastic or glass plates. The plates will compress your breast for a few seconds. Try to stay as relaxed as possible during the procedure. This does not cause any harm to your breasts and any discomfort you feel will be very brief.  X-rays will be taken from different angles of each breast. The procedure may vary among health care providers and hospitals. What happens after the procedure?  The mammogram will be examined by a specialist (radiologist).  You may need to repeat certain parts of the test, depending on the quality of the images. This is commonly done if the radiologist needs a better view of the breast tissue.  Ask when your test results will be ready. Make sure you get your test results.  You may resume your normal activities. This information is not intended to replace advice given to you by your health care provider. Make sure you discuss any questions you have with your health care provider. Document Released: 04/16/2000 Document Revised: 09/22/2015 Document Reviewed: 06/28/2014 Elsevier Interactive Patient Education  [employer].

## 2017-06-08 NOTE — Progress Notes (Signed)
Assessment and Plan:  Diagnoses and all orders for this visit:  Lesion of skin of breast x1 week, appears to be simple superficial comedome - improving with OTC topical abx Breast exam otherwise unremarkable Is due for regular screening mammogram - will recommend she proceed with this Discussed very low suspicion of any concerning lesions based on history and exam  Further disposition pending results of labs. Discussed med's effects and SE's.   Over 15 minutes of exam, counseling, chart review, and critical decision making was performed.   Future Appointments  Date Time Provider [place]  06/08/2017 10:00 AM Liane Comber, NP [facility] None  11/10/2017 10:30 AM Liane Comber, NP [facility] None    ------------------------------------------------------------------------------------------------------------------   HPI 62 y.o.female presents for concerns of a new red "spot" on her left breast. She has not noted any lump or other palpable abnormality to the breast. No new discharge or asymmetry. The area appears as a very faintly pink area to the L breast at 4 o clock approx 4-5 cm from the nipple with a very fine point/comedome. Non-tender, no palpable underlying texture or abnormality. She has been applying topical neosporin and reports area does appear to be improving.   Last mammogram reviewed from 04/30/2016 which demonstrated scattered fibroglandular densities, otherwise unremarkable with recommendation for 1 year follow up.   She has not personal or family history of breast or ovarian cancers.   Past Medical History:  Diagnosis Date  . AVM (arteriovenous malformation) brain   . 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 Aspirin Other (See Comments)    CONTRAINDICATED DUE TO HISTORY OF BLEEDING IN BRAIN    Current Outpatient Medications on File Prior to Visit  Medication Sig  . baclofen (LIORESAL) 10 MG  tablet Take 10 mg by mouth.  . butalbital-acetaminophen-caffeine (FIORICET, ESGIC) 50-325-40 MG tablet Take by mouth 2 (two) times daily as needed for headache.  . Calcium Carbonate Antacid (TUMS PO) Take by mouth.  . Calcium Carbonate-Vitamin D 600-400 MG-UNIT tablet Take by mouth.  . Cholecalciferol (VITAMIN D3) 2000 units capsule Take by mouth 2 (two) times daily.   . fexofenadine (ALLEGRA) 180 MG tablet Take 180 mg by mouth.  Marland Kitchen ibuprofen (ADVIL,MOTRIN) 400 MG tablet Take 1 tablet (400 mg total) by mouth every 6 (six) hours as needed for moderate pain.  . Multiple Vitamins-Minerals (MULTIVITAMIN WITH MINERALS) tablet Take by mouth.  . Omega-3 Fatty Acids (RA FISH OIL) 1000 MG CAPS Take by mouth.  . Potassium 99 MG TABS Take by mouth.  . topiramate (TOPAMAX) 200 MG tablet TAKE 1 TABLET BY MOUTH 2 TIMES DAILY, INCREASE TO 1 TABLET 3 TIMES DAILY AS DIRECTED  . vitamin C (ASCORBIC ACID) 500 MG tablet Take 500 mg by mouth.   No current facility-administered medications on file prior to visit.     ROS: all negative except above.   Physical Exam:  There were no vitals taken for this visit.  General Appearance: Well nourished, in no apparent distress. Neck: Supple.  Respiratory: Respiratory effort normal Cardio: RRR with no MRGs. Brisk peripheral pulses without edema.  Lymphatics: Non tender without lymphadenopathy.  Musculoskeletal: Baseline contracture of RUE, weakness and limited mobility of R upper and lower extremities, walks slowly with cane Skin: Warm, dry without rashes,  ecchymosis. Single very small superficial appearing comedome to left breast at 4 o clock 4-5 cm from nipple Breasts: Bilaterally fibrous/dense throughout without notable  palpable lump, no discharge from nipple or change in texture other than single comedome Psych: Awake and oriented X 3, normal affect, Insight and Judgment appropriate.     Izora Ribas, NP 9:31 AM [facility]

## 2017-06-28 ENCOUNTER — Ambulatory Visit
Admission: RE | Admit: 2017-06-28 | Discharge: 2017-06-28 | Disposition: A | Discharge status: Home / Self Care | Payer: Medicare Other | Source: Ambulatory Visit | Attending: Internal Medicine | Admitting: Internal Medicine

## 2017-06-28 DIAGNOSIS — Z1231 Encounter for screening mammogram for malignant neoplasm of breast: Secondary | ICD-10-CM

## 2017-06-28 IMAGING — MG DIGITAL SCREENING BILATERAL MAMMOGRAM WITH TOMO AND CAD
8 series · 8 of 24 positions shown · non-contrast
Comparison: Previous exam(s).

CLINICAL DATA: Screening.

EXAM:
DIGITAL SCREENING BILATERAL MAMMOGRAM WITH TOMO AND CAD

[L MLO synth-2D]
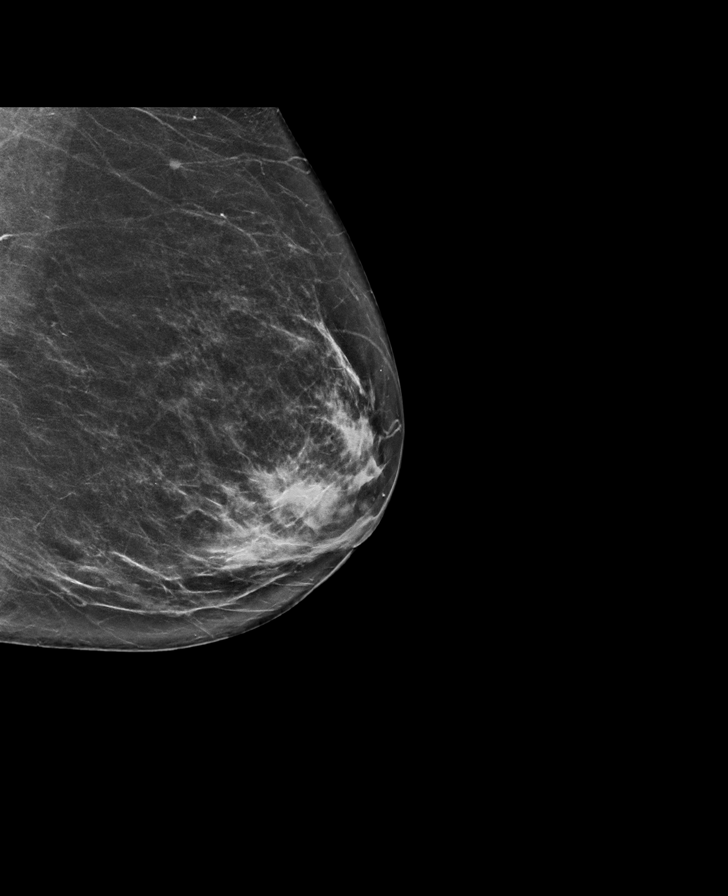

[L CC synth-2D]
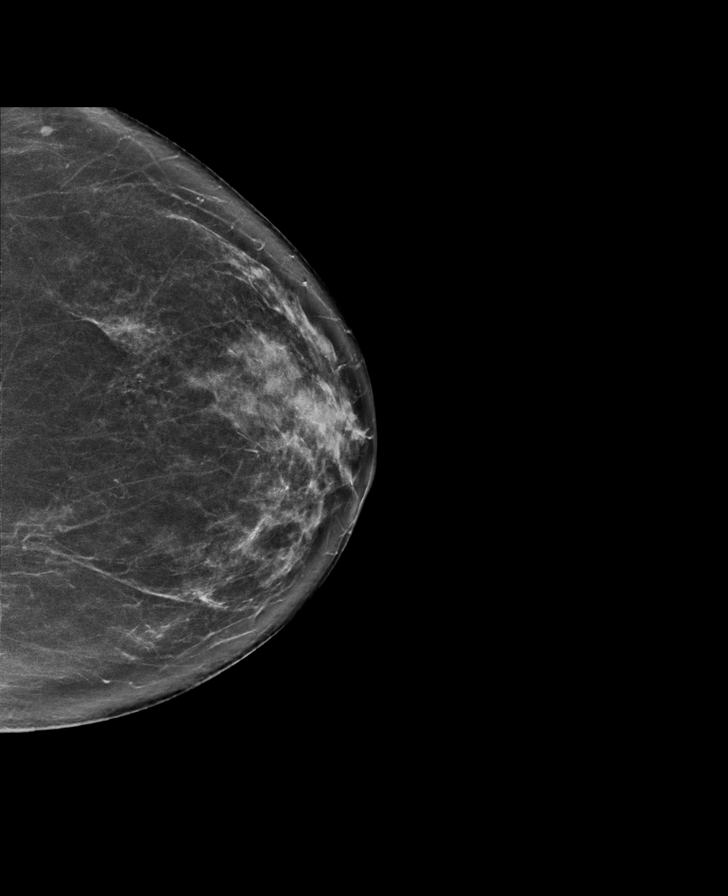

[R MLO synth-2D]
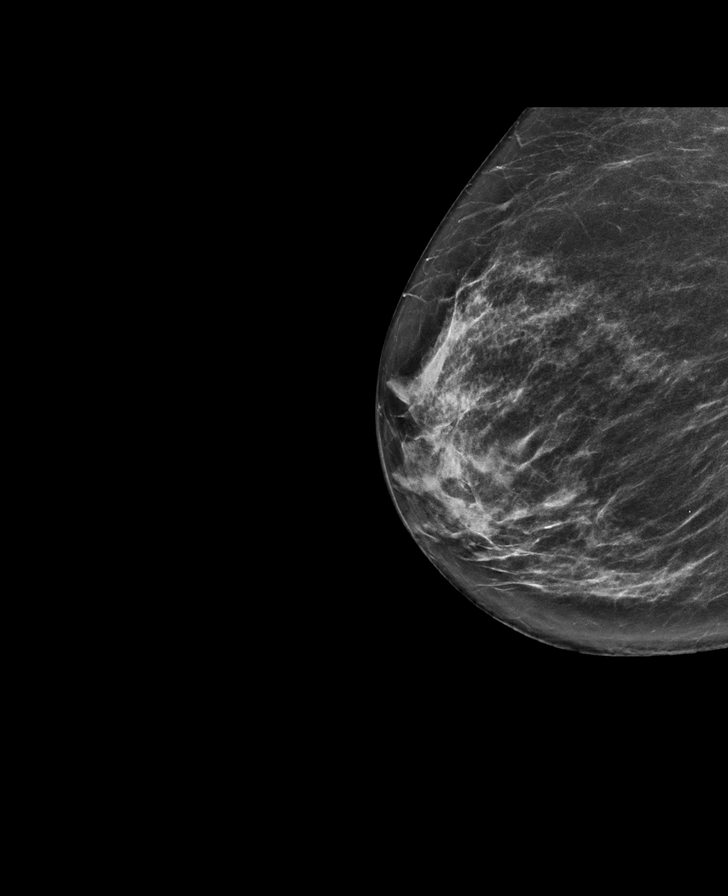

[R CC synth-2D]
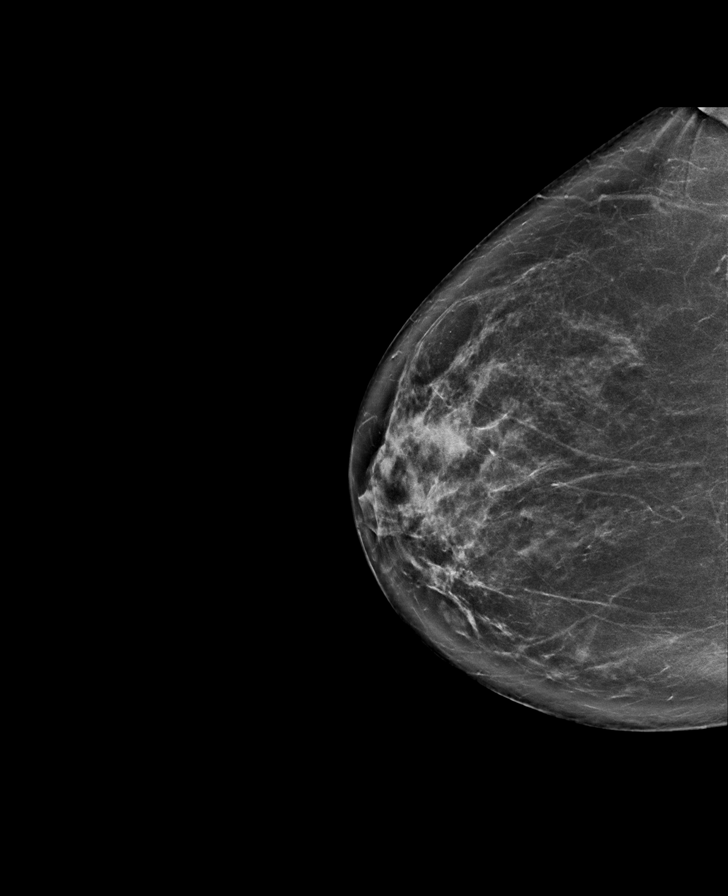

[L MLO tomo · tomo slice 38/75.0]
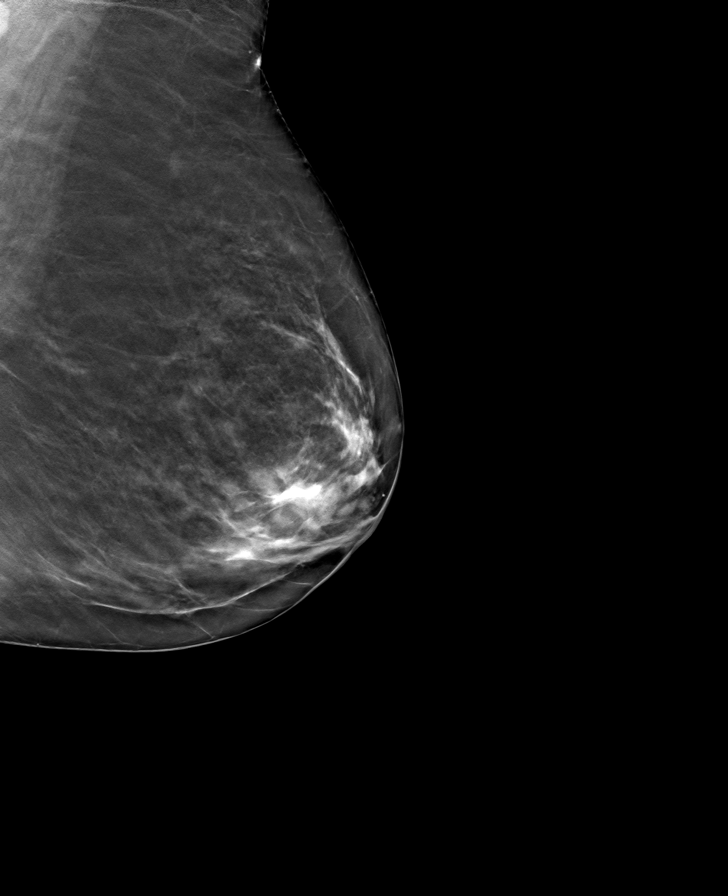

[R CC tomo · tomo slice 39/76.0]
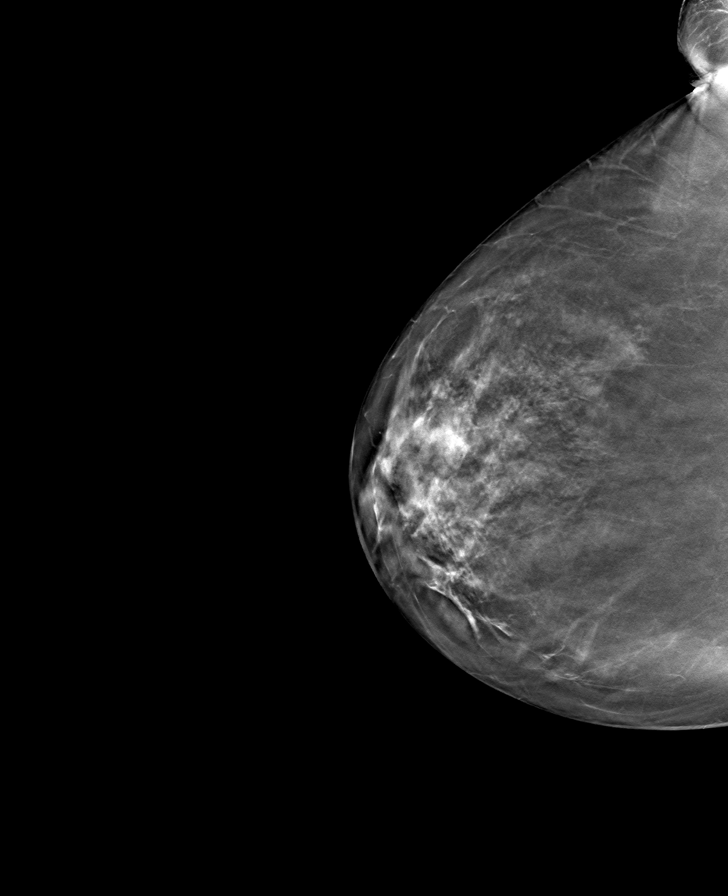

[L CC tomo · tomo slice 37/73.0]
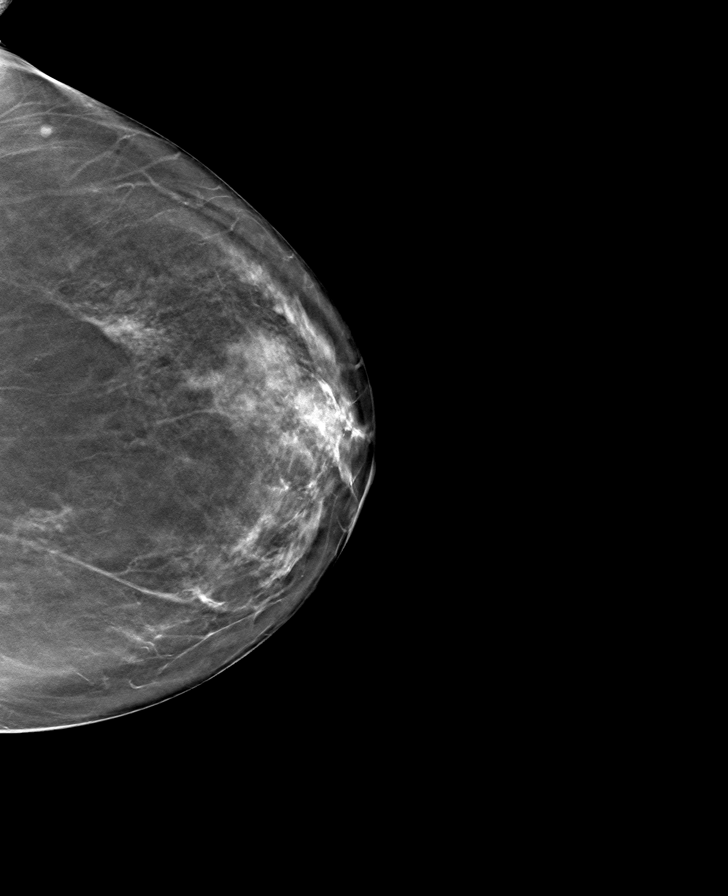

[R MLO tomo · tomo slice 35/68.0]
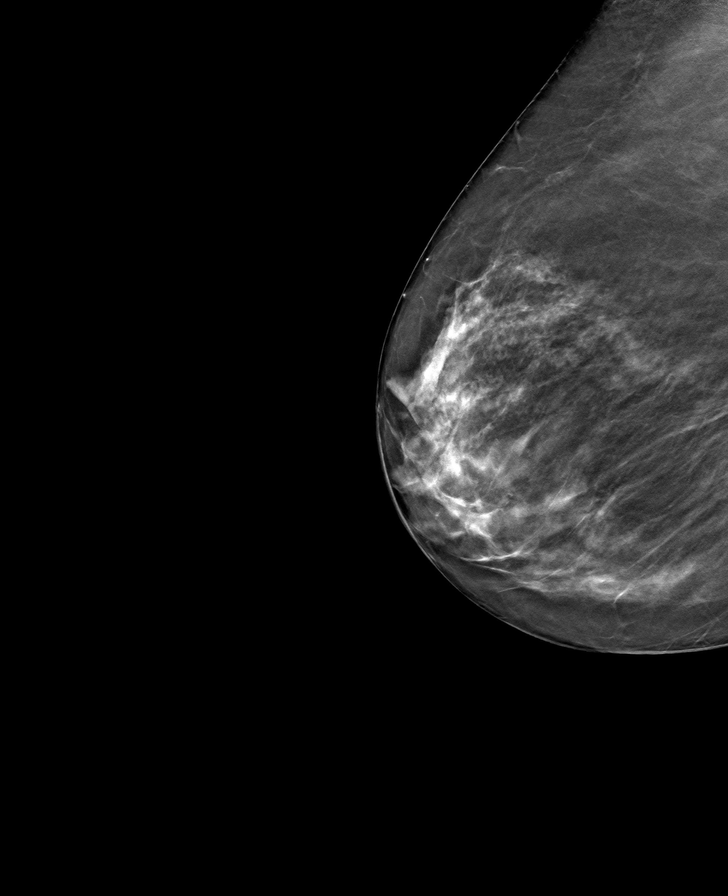

[8 of 24 positions shown; findings below may reference images not displayed]

ACR Breast Density Category c: The breast tissue is heterogeneously
dense, which may obscure small masses.
FINDINGS: There are no findings suspicious for malignancy. Images were
processed with CAD.
IMPRESSION: No mammographic evidence of malignancy. A result letter of this
screening mammogram will be mailed directly to the patient.

RECOMMENDATION:
Screening mammogram in one year. (Code:[5V])

BI-RADS CATEGORY  1: Negative.

## 2017-08-01 ENCOUNTER — Other Ambulatory Visit: Payer: Self-pay | Admitting: Adult Health

## 2017-08-01 DIAGNOSIS — F320 Major depressive disorder, single episode, mild: Secondary | ICD-10-CM

## 2017-11-09 NOTE — Progress Notes (Deleted)
FOLLOW UP  Assessment and Plan:   Hypertension Well controlled with current medications  Monitor blood pressure at home; patient to call if consistently greater than 130/80 Continue DASH diet.   Reminder to go to the ER if any CP, SOB, nausea, dizziness, severe HA, changes vision/speech, left arm numbness and tingling and jaw pain.  Cholesterol Currently at goal by lifestyle Continue low cholesterol diet and exercise.  Check lipid panel.   Other glucose Recent A1Cs at goal Discussed diet/exercise, weight management  Defer A1C; check CMP  BMI 23  Continue to recommend diet heavy in fruits and veggies and low in animal meats, cheeses, and dairy products, appropriate calorie intake Discuss exercise recommendations routinely Continue to monitor weight at each visit  Vitamin D Def At goal at last visit; continue supplementation to maintain goal of 70-100 Defer Vit D level  Continue diet and meds as discussed. Further disposition pending results of labs. Discussed med's effects and SE's.   Over 30 minutes of exam, counseling, chart review, and critical decision making was performed.   No future appointments.  ----------------------------------------------------------------------------------------------------------------------  HPI 62 y.o. female  presents for 3 month follow up on hypertension, cholesterol, glucose management, weight and vitamin D deficiency. Patient has hx/o seizure disorder predating to age 17 yo and Brain AVM at age 61 yo with a 1st stroke at age 62 (62)  And 3 more strokes by age 60 yo (76) when she had he 1st surg for AVM. Patient has a spastic Rt HP. Her last seizure reportedly was circa 1988. She is on topamax.   Needs AWV after 10/30, needs follow ups, CPE, etc  She is prescribed lexapro for mood/depression due to conflicts with family member she has been living with; she has since moved to her own apartment, doing much better, but feels  lexapro is beneficial and would like to continue.  BMI is Body mass index is 22.09 kg/m., she has been working on diet and exercise. Wt Readings from Last 3 Encounters:  11/10/17 140 lb (63.5 kg)  06/08/17 149 lb (67.6 kg)  06/01/17 149 lb (67.6 kg)   Her blood pressure has been controlled at home, today their BP is BP: 104/66  She does workout. She denies chest pain, shortness of breath, dizziness.   She is not on cholesterol medication (on omega 3 supplement only). Her cholesterol is at goal. The cholesterol last visit was:   Lab Results  Component Value Date   CHOL 168 03/01/2017   HDL 48 (L) 03/01/2017   LDLCALC 97 03/01/2017   TRIG 124 03/01/2017   CHOLHDL 3.5 03/01/2017    She has been working on diet and exercise for glucose management, and denies foot ulcerations, increased appetite, nausea, paresthesia of the feet, polydipsia, polyuria, visual disturbances, vomiting and weight loss. Last A1C in the office was:  Lab Results  Component Value Date   HGBA1C 5.4 11/15/2016   Patient is on Vitamin D supplement.   Lab Results  Component Value Date   VD25OH 74 11/15/2016        Current Medications:  Current Outpatient Medications on File Prior to Visit  Medication Sig  . baclofen (LIORESAL) 10 MG tablet Take 10 mg by mouth.  . butalbital-acetaminophen-caffeine (FIORICET, ESGIC) 50-325-40 MG tablet Take by mouth 2 (two) times daily as needed for headache.  . Calcium Carbonate Antacid (TUMS PO) Take by mouth.  . Calcium Carbonate-Vitamin D 600-400 MG-UNIT tablet Take by mouth.  Marland Kitchen  Cholecalciferol (VITAMIN D3) 2000 units capsule Take by mouth 2 (two) times daily.   Marland Kitchen escitalopram (LEXAPRO) 10 MG tablet Take 1 tablet (10 mg total) by mouth daily.  . fexofenadine (ALLEGRA) 180 MG tablet Take 180 mg by mouth.  Marland Kitchen ibuprofen (ADVIL,MOTRIN) 400 MG tablet Take 1 tablet (400 mg total) by mouth every 6 (six) hours as needed for moderate pain.  . Multiple Vitamins-Minerals  (MULTIVITAMIN WITH MINERALS) tablet Take by mouth.  . Omega-3 Fatty Acids (RA FISH OIL) 1000 MG CAPS Take by mouth.  . Potassium 99 MG TABS Take by mouth.  . topiramate (TOPAMAX) 200 MG tablet   . vitamin C (ASCORBIC ACID) 500 MG tablet Take 500 mg by mouth.   No current facility-administered medications on file prior to visit.      Allergies:  Allergies  Allergen Reactions  . Aspirin Other (See Comments)    CONTRAINDICATED DUE TO HISTORY OF BLEEDING IN BRAIN     Medical History:  Past Medical History:  Diagnosis Date  . AVM (arteriovenous malformation) brain   . 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  HENT: Negative for hearing loss and tinnitus.   Eyes: Negative for blurred vision and double vision.  Respiratory: Negative for cough, shortness of breath and wheezing.   Cardiovascular: Negative for chest pain, palpitations, orthopnea, claudication and leg swelling.  Gastrointestinal: Negative for abdominal pain, blood in stool, constipation, diarrhea, heartburn, melena, nausea and vomiting.  Genitourinary: Negative.   Musculoskeletal: Negative for joint pain and myalgias.  Skin: Negative for rash.  Neurological: Negative for dizziness, tingling, sensory change, weakness and headaches.  Endo/Heme/Allergies: Negative for polydipsia.  Psychiatric/Behavioral: Negative.   All other systems reviewed and are negative.    Physical Exam: BP 104/66   Pulse (!) 56   Temp 97.6 F (36.4 C)   Ht 5' 6.75" (1.695 m)   Wt 140 lb (63.5 kg)   SpO2 98%   BMI 22.09 kg/m  Wt Readings from Last 3 Encounters:  11/10/17 140 lb (63.5 kg)  06/08/17 149 lb (67.6 kg)  06/01/17 149 lb (67.6 kg)   General Appearance: Well nourished, in no apparent distress. Eyes: PERRLA, EOMs,  conjunctiva no swelling or erythema Sinuses: No Frontal/maxillary tenderness ENT/Mouth: Ext aud canals clear, TMs without erythema, bulging. No erythema, swelling, or exudate on post pharynx.  Tonsils not swollen or erythematous. Hearing normal.  Neck: Supple, thyroid normal.  Respiratory: Respiratory effort normal, BS equal bilaterally without rales, rhonchi, wheezing or stridor.  Cardio: RRR with no MRGs. Brisk peripheral pulses without edema.  Abdomen: Soft, + BS.  Non tender, no guarding, rebound, hernias, masses. Lymphatics: Non tender without lymphadenopathy.  Musculoskeletal: Weakness right upper and lower extremities, Limping gait with quad cane Skin: Warm, dry without rashes, lesions, ecchymosis.  Neuro: Cranial nerves intact. No cerebellar symptoms.  Psych: Awake and oriented X 3, normal affect, Insight and Judgment poor.    Izora Ribas, NP 10:48 AM [facility]

## 2017-11-10 ENCOUNTER — Other Ambulatory Visit (HOSPITAL_COMMUNITY)
Admission: RE | Admit: 2017-11-10 | Discharge: 2017-11-10 | Disposition: A | Discharge status: Home / Self Care | Payer: Medicare Other | Source: Ambulatory Visit | Attending: Adult Health | Admitting: Adult Health

## 2017-11-10 ENCOUNTER — Ambulatory Visit (INDEPENDENT_AMBULATORY_CARE_PROVIDER_SITE_OTHER): Payer: Medicare Other | Admitting: Adult Health

## 2017-11-10 ENCOUNTER — Encounter: Payer: Self-pay | Admitting: Adult Health

## 2017-11-10 VITALS — BP 104/66 | HR 56 | Temp 97.6°F | Ht 66.75 in | Wt 140.0 lb

## 2017-11-10 DIAGNOSIS — Z79899 Other long term (current) drug therapy: Secondary | ICD-10-CM | POA: Diagnosis not present

## 2017-11-10 DIAGNOSIS — G40219 Localization-related (focal) (partial) symptomatic epilepsy and epileptic syndromes with complex partial seizures, intractable, without status epilepticus: Secondary | ICD-10-CM

## 2017-11-10 DIAGNOSIS — Z9189 Other specified personal risk factors, not elsewhere classified: Secondary | ICD-10-CM

## 2017-11-10 DIAGNOSIS — Z0000 Encounter for general adult medical examination without abnormal findings: Secondary | ICD-10-CM

## 2017-11-10 DIAGNOSIS — E782 Mixed hyperlipidemia: Secondary | ICD-10-CM | POA: Diagnosis not present

## 2017-11-10 DIAGNOSIS — R0989 Other specified symptoms and signs involving the circulatory and respiratory systems: Secondary | ICD-10-CM

## 2017-11-10 DIAGNOSIS — E559 Vitamin D deficiency, unspecified: Secondary | ICD-10-CM | POA: Diagnosis not present

## 2017-11-10 DIAGNOSIS — Z1211 Encounter for screening for malignant neoplasm of colon: Secondary | ICD-10-CM

## 2017-11-10 DIAGNOSIS — Z1389 Encounter for screening for other disorder: Secondary | ICD-10-CM | POA: Diagnosis not present

## 2017-11-10 DIAGNOSIS — G8191 Hemiplegia, unspecified affecting right dominant side: Secondary | ICD-10-CM

## 2017-11-10 DIAGNOSIS — R7309 Other abnormal glucose: Secondary | ICD-10-CM

## 2017-11-10 DIAGNOSIS — N179 Acute kidney failure, unspecified: Secondary | ICD-10-CM | POA: Insufficient documentation

## 2017-11-10 DIAGNOSIS — N183 Chronic kidney disease, stage 3 unspecified: Secondary | ICD-10-CM

## 2017-11-10 DIAGNOSIS — R5383 Other fatigue: Secondary | ICD-10-CM | POA: Diagnosis not present

## 2017-11-10 DIAGNOSIS — Z6823 Body mass index (BMI) 23.0-23.9, adult: Secondary | ICD-10-CM

## 2017-11-10 DIAGNOSIS — Z124 Encounter for screening for malignant neoplasm of cervix: Secondary | ICD-10-CM | POA: Insufficient documentation

## 2017-11-10 DIAGNOSIS — N1832 Chronic kidney disease, stage 3b: Secondary | ICD-10-CM | POA: Insufficient documentation

## 2017-11-10 DIAGNOSIS — Z136 Encounter for screening for cardiovascular disorders: Secondary | ICD-10-CM | POA: Diagnosis not present

## 2017-11-10 DIAGNOSIS — D649 Anemia, unspecified: Secondary | ICD-10-CM

## 2017-11-10 DIAGNOSIS — E2839 Other primary ovarian failure: Secondary | ICD-10-CM

## 2017-11-10 NOTE — Progress Notes (Signed)
Complete Physical  Assessment and Plan:   Encounter for routine adult health examination without abnormal findings  Labile hypertension Monitor blood pressure at home; call if consistently over 130/80 Continue DASH diet.   Reminder to go to the ER if any CP, SOB, nausea, dizziness, severe HA, changes vision/speech, left arm numbness and tingling and jaw pain.      EKG 12-Lead  Partial symptomatic epilepsy with complex partial seizures, intractable, without status epilepticus ([place]) Followed by Dr. Marlou Sa, last seizure remote  Vitamin D deficiency -     VITAMIN D 25 Hydroxy (Vit-D Deficiency, Fractures)  Hyperlipidemia, mixed Continue low cholesterol diet and exercise.  Check lipid panel.  -     Lipid panel -     TSH  Abnormal glucose -     Hemoglobin A1c  BMI 23.0-23.9, adult  Medication management -     CBC with Differential/Platelet -     COMPLETE METABOLIC PANEL WITH GFR -     Magnesium -     Urinalysis w microscopic + reflex cultur  Anemia, unspecified type -     Vitamin B12  Screening for cervical cancer -     Cytology - PAP  Screening for hematuria or proteinuria -     Microalbumin / creatinine urine ratio  Fatigue, unspecified type -     Vitamin B12  Right hemiparesis (HCC) Continue with walker, PT as needed  CKD (chronic kidney disease) stage 3, GFR 30-59 ml/min (HCC) Increase fluids, avoid NSAIDS, monitor sugars, will monitor  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 Screening labs and tests as requested with regular follow-up as recommended. Over 40 minutes of exam, counseling, chart review, and complex, high level critical decision making was performed this visit.   No future appointments.   HPI  62 y.o. 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 Follows with Dr. Duwayne Heck for history of seizure disorder predating to age 48 yo and Brain AVM at age 5 yo with a 1st stroke at age 30 (41)  And 3 more strokes by age 72 yo (50) when she had he 1st surg for AVM. Patient has residual R hemiparesis. Her last seizure reportedly was circa 1988. She is on topamax.    She has recently moved into an apartment after living with her uncle for several months; she is very happy with this change and reports she is doing well. She was placed on lexapro due to depression and agitation r/t problems while living with family, but wishes to continue this medication as she feels there continues to be benefit.    BMI is Body mass index is 22.09 kg/m., she has not been working on diet and exercise. Wt Readings from Last 3 Encounters:  11/10/17 140 lb (63.5 kg)  06/08/17 149 lb (67.6 kg)  06/01/17 149 lb (67.6 kg)   Today their BP is BP: 104/66 She does workout. She denies chest pain, shortness of breath, dizziness.   She is not on cholesterol medication and denies myalgias. Her cholesterol is at goal. The cholesterol last visit was:   Lab Results  Component Value Date   CHOL 168 03/01/2017   HDL 48 (  L) 03/01/2017   LDLCALC 97 03/01/2017   TRIG 124 03/01/2017   CHOLHDL 3.5 03/01/2017    Last A1C in the office was:  Lab Results  Component Value Date   HGBA1C 5.4 11/15/2016   Last GFR: Lab Results  Component Value Date   GFRNONAA 42 (L) 03/01/2017   Patient is on Vitamin D supplement.   Lab Results  Component Value Date   VD25OH 74 11/15/2016      Current Medications:  Current Outpatient Medications on File Prior to Visit  Medication Sig Dispense Refill  . baclofen (LIORESAL) 10 MG tablet Take 10 mg by mouth.    .  butalbital-acetaminophen-caffeine (FIORICET, ESGIC) 50-325-40 MG tablet Take by mouth 2 (two) times daily as needed for headache.    . Calcium Carbonate Antacid (TUMS PO) Take by mouth.    . Calcium Carbonate-Vitamin D 600-400 MG-UNIT tablet Take by mouth.    . Cholecalciferol (VITAMIN D3) 2000 units capsule Take by mouth 2 (two) times daily.     Marland Kitchen escitalopram (LEXAPRO) 10 MG tablet Take 1 tablet (10 mg total) by mouth daily. 90 tablet 1  . fexofenadine (ALLEGRA) 180 MG tablet Take 180 mg by mouth.    Marland Kitchen ibuprofen (ADVIL,MOTRIN) 400 MG tablet Take 1 tablet (400 mg total) by mouth every 6 (six) hours as needed for moderate pain. 90 tablet 2  . Multiple Vitamins-Minerals (MULTIVITAMIN WITH MINERALS) tablet Take by mouth.    . Omega-3 Fatty Acids (RA FISH OIL) 1000 MG CAPS Take by mouth.    . Potassium 99 MG TABS Take by mouth.    . topiramate (TOPAMAX) 200 MG tablet   11  . vitamin C (ASCORBIC ACID) 500 MG tablet Take 500 mg by mouth.     No current facility-administered medications on file prior to visit.    Allergies:  Allergies  Allergen Reactions  . 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 Health Maintenance:   Immunization History  Administered Date(s) Administered  . Influenza-Unspecified 03/01/2017  . Pneumococcal-Unspecified 05/03/2004  . Tdap 08/11/2009  . Zoster 04/14/2012    Preventative care: Last [facility]: [facility], check 5 years Last mammogram: 06/2017 Last pap smear/pelvic exam: 3 YEARS AGO DEXA: May 29 2012 due  Prior vaccinations: TD or Tdap:  2011 Influenza: Oct 17th 2018  Pneumococcal: 2006 Prevnar13: due age 56 Shingles/Zostavax: 2013  Names of Other Physician/Practitioners you currently use: 1. [facility] here for primary care 2. [facility], eye doctor, last visit YEARLY 3. Dr. Conley Canal, dentist, last visit 08/2017  Patient Care Team: Unk Pinto, MD as PCP - General (Internal Medicine)  Surgical History:  She has a past surgical history that includes Brain surgery. Family History:  Herfamily history includes Dementia in her father and paternal uncle; Diabetes in her father; Heart disease in her mother; Hyperlipidemia in her father and paternal uncle; Hypertension in her father and paternal uncle. Social History:  She reports that she has never smoked. She has never used smokeless tobacco. She reports that she does not drink alcohol or use drugs.  Review of Systems: Review of Systems  Constitutional: Negative  for malaise/fatigue and weight loss.  HENT: Negative for hearing loss and tinnitus.   Eyes: Negative for blurred vision and double vision.  Respiratory: Negative for cough, sputum production, shortness of breath and wheezing.   Cardiovascular: Negative for chest pain, palpitations, orthopnea, claudication, leg swelling and PND.  Gastrointestinal: Negative for abdominal pain, blood in stool, constipation, diarrhea, heartburn, melena, nausea and vomiting.  Genitourinary: Negative.   Musculoskeletal: Positive for falls (r/t R hemiparesis, no injury). Negative for joint pain and myalgias.  Skin: Negative for rash.  Neurological: Positive for headaches. Negative for dizziness, tingling, sensory change and weakness.  Endo/Heme/Allergies: Negative for polydipsia.  Psychiatric/Behavioral: Negative.  Negative for depression, memory loss, substance abuse and suicidal ideas. The patient is not nervous/anxious and does not have insomnia.   All other systems reviewed  and are negative.   Physical Exam: Estimated body mass index is 22.09 kg/m as calculated from the following:   Height as of this encounter: 5' 6.75" (1.695 m).   Weight as of this encounter: 140 lb (63.5 kg). BP 104/66   Pulse (!) 56   Temp 97.6 F (36.4 C)   Ht 5' 6.75" (1.695 m)   Wt 140 lb (63.5 kg)   SpO2 98%   BMI 22.09 kg/m  General Appearance: Well nourished, in no apparent distress.  Eyes: PERRLA, EOMs, conjunctiva no swelling or erythema Sinuses: No Frontal/maxillary tenderness  ENT/Mouth: Ext aud canals clear, normal light reflex with TMs without erythema, bulging. Good dentition. No erythema, swelling, or exudate on post pharynx. Tonsils not swollen or erythematous. Hearing normal.  Neck: Supple, thyroid normal. No bruits  Respiratory: Respiratory effort normal, BS equal bilaterally without rales, rhonchi, wheezing or stridor.   Cardio: RRR without murmurs, rubs or gallops. Brisk peripheral pulses without edema.  Chest: symmetric, with normal excursions and percussion.  Breasts: Symmetric, without lumps, nipple discharge, retractions.  Abdomen: Soft, nontender, no guarding, rebound, hernias, masses, or organomegaly.  Lymphatics: Non tender without lymphadenopathy.  Genitourinary: Normal, no lesions, Bi-manual examination: Non-tender; no adenxal masses or nodularity  Musculoskeletal: Poor ROM through R upper and lower extremities, contracture of right hand present, 5/5 strength of left, and Rt Hp with limping gait. Has rolling walker Skin: Warm, dry without rashes, lesions, ecchymosis. Neuro: Cranial nerves intact, reflexes equal bilaterally. Normal muscle tone of left, Sensation intact.  Psych: Awake and oriented X 3, normal affect, Insight and Judgment fair.      EKG: WNL no ST changes.  Gorden Harms Chen[place] Luddy 12:11 PM [facility]

## 2017-11-11 LAB — MAGNESIUM: Magnesium: 2 mg/dL (ref 1.5–2.5)

## 2017-11-11 LAB — CBC WITH DIFFERENTIAL/PLATELET
Basophils Absolute: 38 cells/uL (ref 0–200)
Basophils Relative: 0.7 %
EOS PCT: 2.6 %
Eosinophils Absolute: 140 cells/uL (ref 15–500)
HEMATOCRIT: 38.2 % (ref 35.0–45.0)
HEMOGLOBIN: 12.6 g/dL (ref 11.7–15.5)
LYMPHS ABS: 1604 {cells}/uL (ref 850–3900)
MCH: 30.7 pg (ref 27.0–33.0)
MCHC: 33 g/dL (ref 32.0–36.0)
MCV: 93.2 fL (ref 80.0–100.0)
MPV: 11.1 fL (ref 7.5–12.5)
Monocytes Relative: 8.3 %
NEUTROS ABS: 3170 {cells}/uL (ref 1500–7800)
Neutrophils Relative %: 58.7 %
Platelets: 228 10*3/uL (ref 140–400)
RBC: 4.1 10*6/uL (ref 3.80–5.10)
RDW: 11.7 % (ref 11.0–15.0)
Total Lymphocyte: 29.7 %
WBC mixed population: 448 cells/uL (ref 200–950)
WBC: 5.4 10*3/uL (ref 3.8–10.8)

## 2017-11-11 LAB — URINALYSIS W MICROSCOPIC + REFLEX CULTURE
BACTERIA UA: NONE SEEN /HPF
Bilirubin Urine: NEGATIVE
Glucose, UA: NEGATIVE
HYALINE CAST: NONE SEEN /LPF
Hgb urine dipstick: NEGATIVE
KETONES UR: NEGATIVE
Leukocyte Esterase: NEGATIVE
NITRITES URINE, INITIAL: NEGATIVE
PH: 7.5 (ref 5.0–8.0)
Protein, ur: NEGATIVE
SQUAMOUS EPITHELIAL / LPF: NONE SEEN /HPF (ref <=5)
Specific Gravity, Urine: 1.016 (ref 1.001–1.03)
WBC, UA: NONE SEEN /HPF (ref 0–5)

## 2017-11-11 LAB — COMPLETE METABOLIC PANEL WITH GFR
AG Ratio: 1.6 (calc) (ref 1.0–2.5)
ALT: 17 U/L (ref 6–29)
AST: 18 U/L (ref 10–35)
Albumin: 4.4 g/dL (ref 3.6–5.1)
Alkaline phosphatase (APISO): 109 U/L (ref 33–130)
BILIRUBIN TOTAL: 0.3 mg/dL (ref 0.2–1.2)
BUN/Creatinine Ratio: 21 (calc) (ref 6–22)
BUN: 23 mg/dL (ref 7–25)
CALCIUM: 10.4 mg/dL (ref 8.6–10.4)
CHLORIDE: 108 mmol/L (ref 98–110)
CO2: 27 mmol/L (ref 20–32)
Creat: 1.08 mg/dL — ABNORMAL HIGH (ref 0.50–1.05)
GFR, EST AFRICAN AMERICAN: 65 mL/min/{1.73_m2} (ref >=60)
GFR, EST NON AFRICAN AMERICAN: 56 mL/min/{1.73_m2} — AB (ref >=60)
GLOBULIN: 2.8 g/dL (ref 1.9–3.7)
Glucose, Bld: 92 mg/dL (ref 65–99)
POTASSIUM: 4.1 mmol/L (ref 3.5–5.3)
SODIUM: 144 mmol/L (ref 135–146)
TOTAL PROTEIN: 7.2 g/dL (ref 6.1–8.1)

## 2017-11-11 LAB — LIPID PANEL
Cholesterol: 172 mg/dL (ref <200)
HDL: 34 mg/dL — AB (ref >50)
LDL Cholesterol (Calc): 104 mg/dL (calc) — ABNORMAL HIGH
NON-HDL CHOLESTEROL (CALC): 138 mg/dL — AB (ref <130)
Total CHOL/HDL Ratio: 5.1 (calc) — ABNORMAL HIGH (ref <5.0)
Triglycerides: 219 mg/dL — ABNORMAL HIGH (ref <150)

## 2017-11-11 LAB — NO CULTURE INDICATED

## 2017-11-11 LAB — HEMOGLOBIN A1C
EAG (MMOL/L): 5.5 (calc)
HEMOGLOBIN A1C: 5.1 %{Hb} (ref <5.7)
MEAN PLASMA GLUCOSE: 100 (calc)

## 2017-11-11 LAB — VITAMIN D 25 HYDROXY (VIT D DEFICIENCY, FRACTURES): Vit D, 25-Hydroxy: 76 ng/mL (ref 30–100)

## 2017-11-11 LAB — MICROALBUMIN / CREATININE URINE RATIO
CREATININE, URINE: 66 mg/dL (ref 20–275)
MICROALB/CREAT RATIO: 11 ug/mg{creat} (ref <30)
Microalb, Ur: 0.7 mg/dL

## 2017-11-11 LAB — VITAMIN B12: VITAMIN B 12: 827 pg/mL (ref 200–1100)

## 2017-11-11 LAB — TSH: TSH: 1.91 mIU/L (ref 0.40–4.50)

## 2017-11-21 DIAGNOSIS — H2513 Age-related nuclear cataract, bilateral: Secondary | ICD-10-CM | POA: Diagnosis not present

## 2017-11-21 DIAGNOSIS — H524 Presbyopia: Secondary | ICD-10-CM | POA: Diagnosis not present

## 2017-11-22 ENCOUNTER — Other Ambulatory Visit: Payer: Self-pay | Admitting: Adult Health

## 2017-11-22 DIAGNOSIS — Z124 Encounter for screening for malignant neoplasm of cervix: Secondary | ICD-10-CM

## 2017-12-01 ENCOUNTER — Other Ambulatory Visit: Payer: Self-pay | Admitting: Adult Health

## 2017-12-01 DIAGNOSIS — F320 Major depressive disorder, single episode, mild: Secondary | ICD-10-CM

## 2017-12-06 ENCOUNTER — Ambulatory Visit (INDEPENDENT_AMBULATORY_CARE_PROVIDER_SITE_OTHER): Payer: Medicare Other | Admitting: Gynecology

## 2017-12-06 ENCOUNTER — Encounter: Payer: Self-pay | Admitting: Gynecology

## 2017-12-06 VITALS — BP 118/74 | Ht 65.5 in | Wt 148.0 lb

## 2017-12-06 DIAGNOSIS — N952 Postmenopausal atrophic vaginitis: Secondary | ICD-10-CM | POA: Diagnosis not present

## 2017-12-06 DIAGNOSIS — R87615 Unsatisfactory cytologic smear of cervix: Secondary | ICD-10-CM | POA: Diagnosis not present

## 2017-12-06 DIAGNOSIS — Z124 Encounter for screening for malignant neoplasm of cervix: Secondary | ICD-10-CM | POA: Diagnosis not present

## 2017-12-06 LAB — CYTOLOGY - PAP: HPV (WINDOPATH): NOT DETECTED

## 2017-12-06 NOTE — Progress Notes (Signed)
    Tonya Thompson 08-16-1955 [id number]        62 y.o.  G0P0000 new patient presents for pelvic exam and attempted repeat Pap smear.  She recently undergone full exam through her primary provider's office where Pap smear was unsatisfactory although had a negative HPV.  She has no history of abnormal Pap smears previously.  Has not been sexually active for "forever".  Not having other gynecologic complaints such as vaginal dryness irritation or discharge.  Recent breast exam normal.  Mammogram and colonoscopy up-to-date.  Past medical history,surgical history, problem list, medications, allergies, family history and social history were all reviewed and documented in the EPIC chart.  Directed ROS with pertinent positives and negatives documented in the history of present illness/assessment and plan.  Exam: Caryn Bee assistant Vitals:   12/06/17 0901  BP: 118/74  Weight: 148 lb (67.1 kg)  Height: 5' 5.5" (1.664 m)   General appearance: Status post stroke with right-sided extremity paralysis.  Otherwise alert and engaging Abdomen soft nontender without masses guarding rebound Pelvic external BUS vagina with atrophic changes.  Cervix flush with upper vagina.  Pap smear done.  Uterus normal size midline mobile nontender.  Adnexa without masses or tenderness.  Rectal exam is normal  Assessment/Plan:  62 y.o. G0P0000 with recent unsatisfactory Pap smear.  Has been sexually active many years ago.  No history of abnormal Pap smears in the past.  Pap smear done today.  I discussed with the patient that if the Pap smear again returns unsatisfactory that at this point I do not think further attempts are necessary.  She has a negative high risk HPV screen and we discussed that some current screening recommendations use HPV screen only as a screening tool.  It would be highly unlikely that she would have an issue with cervical dysplasia/cancer with a negative HPV screen.  We will await the Pap smear results  and then go from there.  Otherwise she is doing well from a gynecologic issue with no bleeding, menopausal symptoms or vaginal atrophy symptoms.  She will continue to follow-up with her primary provider for routine health care.  I spent a total of 20 face-to-face minutes with the patient, over 50% was spent counseling and coordination of care.     Anastasio Auerbach MD, 10:01 AM 12/06/2017

## 2017-12-06 NOTE — Addendum Note (Signed)
Addended by: Nelva Nay on: 12/06/2017 10:24 AM   Modules accepted: Orders

## 2017-12-06 NOTE — Patient Instructions (Signed)
Your Pap smear results will be available in 1 to 2 weeks.

## 2017-12-07 LAB — PAP IG W/ RFLX HPV ASCU

## 2018-01-10 ENCOUNTER — Encounter: Payer: Self-pay | Admitting: Adult Health

## 2018-01-10 ENCOUNTER — Ambulatory Visit
Admission: RE | Admit: 2018-01-10 | Discharge: 2018-01-10 | Disposition: A | Discharge status: Home / Self Care | Payer: Medicare Other | Source: Ambulatory Visit | Attending: Adult Health | Admitting: Adult Health

## 2018-01-10 DIAGNOSIS — M8580 Other specified disorders of bone density and structure, unspecified site: Secondary | ICD-10-CM | POA: Insufficient documentation

## 2018-01-10 DIAGNOSIS — Z9189 Other specified personal risk factors, not elsewhere classified: Secondary | ICD-10-CM

## 2018-01-10 DIAGNOSIS — E2839 Other primary ovarian failure: Secondary | ICD-10-CM

## 2018-01-10 DIAGNOSIS — M85852 Other specified disorders of bone density and structure, left thigh: Secondary | ICD-10-CM | POA: Diagnosis not present

## 2018-01-10 DIAGNOSIS — H903 Sensorineural hearing loss, bilateral: Secondary | ICD-10-CM | POA: Diagnosis not present

## 2018-01-10 DIAGNOSIS — Z8352 Family history of ear disorders: Secondary | ICD-10-CM | POA: Diagnosis not present

## 2018-01-10 DIAGNOSIS — Z780 Asymptomatic menopausal state: Secondary | ICD-10-CM | POA: Diagnosis not present

## 2018-02-15 ENCOUNTER — Encounter: Payer: Self-pay | Admitting: Internal Medicine

## 2018-02-15 NOTE — Progress Notes (Signed)
This very nice 62 y.o. single WF  presents for 6 month follow up with HTN, HLD, Pre-Diabetes/Insulin Resistance and Vitamin D Deficiency. Her sister, [name] from [place], is visiting with her & transporting  her today.      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 In 77 at age 45 , she had her 1st stroke & 3 more strokes by age 35  when she underwent her 1st AVM surgery (1980). Her last seizure was in 1988. 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     Patient is followed expectantly for labile Hypertension HTN & BP has been controlled at home. Today's BP is at goal -  104/62. She does have CKD3 (GFR 56). Patient has had no complaints of any cardiac type chest pain, palpitations, dyspnea / orthopnea / PND, dizziness, claudication, or dependent edema.     Hyperlipidemia is controlled with diet & meds. Patient denies myalgias or other med SE's. Last Lipids were  Lab Results  Component Value Date   CHOL 172 11/10/2017   HDL 34 (L) 11/10/2017   LDLCALC 104 (H) 11/10/2017   TRIG 219 (H) 11/10/2017   CHOLHDL 5.1 (H) 11/10/2017      Also, the patient has history of PreDiabetes /Insulin Resistance (A1c 5.4%/ insulin 27.5 in July 2018) and she has had no symptoms of reactive hypoglycemia, diabetic polys, paresthesias or visual blurring.  Last A1c was at goal: Lab Results  Component Value Date   HGBA1C 5.1 11/10/2017      Further, the patient also has history of Vitamin D Deficiency and supplements vitamin D without any suspected side-effects. Last vitamin D was   Lab Results  Component Value Date   VD25OH 76 11/10/2017   Current Outpatient Medications on File Prior to Visit  Medication Sig  . baclofen10 MG tablet Take 10 mg daily as needed   . FIORICET Take 2 x daily as needed for headache.  . TUMS  Take daily  . Calcium -Vit D 600-400  Take  daily  . VITAMIN D 2000 units  Take  2 x daily.   Marland Kitchen escitalopram10 MG TAKE 1 TABLET  EVERY DAY  .  fexofenadine  180 MG  Take daily  . Multi-Vits w/Minerals  Take daily  . Omega-3 FISH OIL 1000 MG  Take daily  . Potassium 99 MG TABS Take daily  . topiramate (TOPAMAX) 200 MG tablet Takes 1&1/2 tab 2 x /day   (3 tab /day)   Allergies  Allergen Reactions  . Aspirin Other (See Comments)    CONTRAINDICATED DUE TO HISTORY OF BLEEDING IN BRAIN   PMHx:   Past Medical History:  Diagnosis Date  . AVM (arteriovenous malformation) brain   . Seizures ([place])    [place] & Partial complex seizure disorder  . Stroke [facility])    Immunization History  Administered Date(s) Administered  . Influenza-Unspecified 03/01/2017  . Pneumococcal-Unspecified 05/03/2004  . Tdap 08/11/2009  . Zoster 04/14/2012   Past Surgical History:  Procedure Laterality Date  . BRAIN SURGERY    . FOOT SURGERY    . TUBAL LIGATION     FHx:    Reviewed / unchanged  SHx:    Reviewed / unchanged   Systems Review:  Constitutional: Denies fever, chills, wt changes, headaches, insomnia, fatigue, night sweats, change in appetite. Eyes: Denies redness, blurred vision, diplopia, discharge, itchy, watery eyes.  ENT: Denies discharge, congestion, post  nasal drip, epistaxis, sore throat, earache, hearing loss, dental pain, tinnitus, vertigo, sinus pain, snoring.  CV: Denies chest pain, palpitations, irregular heartbeat, syncope, dyspnea, diaphoresis, orthopnea, PND, claudication or edema. Respiratory: denies cough, dyspnea, DOE, pleurisy, hoarseness, laryngitis, wheezing.  Gastrointestinal: Denies dysphagia, odynophagia, heartburn, reflux, water brash, abdominal pain or cramps, nausea, vomiting, bloating, diarrhea, constipation, hematemesis, melena, hematochezia  or hemorrhoids. Genitourinary: Denies dysuria, frequency, urgency, nocturia, hesitancy, discharge, hematuria or flank pain. Musculoskeletal: Having some global pain about the Lt shoulder.  Skin: Denies pruritus, rash, hives, warts, acne, eczema or change in skin  lesion(s). Neuro: No weakness, tremor, incoordination, spasms, paresthesia or pain. Psychiatric: Denies confusion, memory loss or sensory loss. Endo: Denies change in weight, skin or hair change.  Heme/Lymph: No excessive bleeding, bruising or enlarged lymph nodes.  Physical Exam  BP 104/62   Pulse 64   Temp (!) 97.3 F (36.3 C)   Resp 16   Ht 5' 5.5" (1.664 m)   Wt 148 lb (67.1 kg)   BMI 24.25 kg/m   Appears  well nourished, well groomed  and in no distress.  Eyes: PERRLA, EOMs, conjunctiva no swelling or erythema. Sinuses: No frontal/maxillary tenderness ENT/Mouth: EAC's clear, TM's nl w/o erythema, bulging. Nares clear w/o erythema, swelling, exudates. Oropharynx clear without erythema or exudates. Oral hygiene is good. Tongue normal, non obstructing. Hearing intact.  Neck: Supple. Thyroid not palpable. Car 2+/2+ without bruits, nodes or JVD. Chest: Respirations nl with BS clear & equal w/o rales, rhonchi, wheezing or stridor.  Cor: Heart sounds normal w/ regular rate and rhythm without sig. murmurs, gallops, clicks or rubs. Peripheral pulses normal and equal  without edema.  Abdomen: Soft & bowel sounds normal. Non-tender w/o guarding, rebound, hernias, masses or organomegaly.  Lymphatics: Unremarkable.  Musculoskeletal:  Sl tender along the anterior/posterior joint lines of the Lt shoulder.  Mild Rt spastic hemi-paresis and limping gait.                                                                                                   Skin: Warm, dry without exposed rashes, lesions or ecchymosis apparent.  Neuro: Cranial nerves intact. Mild Rt hyperreflexia. Sensory rtesting grossly intact.  Pysch: Alert & oriented x 3.  Insight and judgement limited / poor.  Assessment and Plan:  1. Labile hypertension  - Continue medication, monitor blood pressure at home.  - Continue DASH diet.  Reminder to go to the ER if any CP,  SOB, nausea, dizziness, severe HA, changes  vision/speech.  - CBC with Differential/Platelet - COMPLETE METABOLIC PANEL WITH GFR - Magnesium - TSH  2. Hyperlipidemia, mixed  - Continue diet/meds, exercise,& lifestyle modifications.  - Continue monitor periodic cholesterol/liver & renal functions   - Lipid panel - TSH  3. Abnormal glucose  - Continue diet, exercise, lifestyle modifications.  - Monitor appropriate labs.  - Hemoglobin A1c - Insulin, random  4. Vitamin D deficiency  - Continue supplementation.  - VITAMIN D 25 Hydroxyl  5. CKD (chronic kidney disease) stage 3, GFR 30-59 ml/min (HCC)  - COMPLETE METABOLIC PANEL WITH GFR  6. Insulin resistance  - Hemoglobin A1c - Insulin, random  7. Right hemiparesis ([place])   8. Acute pain of left shoulder  - predniSONE (DELTASONE) 20 MG tablet; 1 tab 3 x day for 3 days, then 1 tab 2 x day for 3 days, then 1 tab 1 x day for 5 days  Dispense: 20 tablet; Refill: 0  9. Need for prophylactic vaccination and inoculation against influenza  - FLU VACCINE MDCK QUAD W/Preservative  10. Medication management  - CBC with Differential/Platelet - COMPLETE METABOLIC PANEL WITH GFR - Magnesium - Lipid panel - TSH - Hemoglobin A1c - Insulin, random - VITAMIN D 25 Hydroxy        Discussed  regular exercise, BP monitoring, weight control to achieve/maintain BMI less than 25 and discussed med and SE's. Recommended labs to assess and monitor clinical status with further disposition pending results of labs. Over 30 minutes of exam, counseling, chart review was performed.

## 2018-02-15 NOTE — Patient Instructions (Signed)

## 2018-02-16 ENCOUNTER — Ambulatory Visit (INDEPENDENT_AMBULATORY_CARE_PROVIDER_SITE_OTHER): Payer: Medicare Other | Admitting: Internal Medicine

## 2018-02-16 ENCOUNTER — Ambulatory Visit: Payer: Self-pay | Admitting: Internal Medicine

## 2018-02-16 VITALS — BP 104/62 | HR 64 | Temp 97.3°F | Resp 16 | Ht 65.5 in | Wt 148.0 lb

## 2018-02-16 DIAGNOSIS — E88819 Insulin resistance, unspecified: Secondary | ICD-10-CM

## 2018-02-16 DIAGNOSIS — N183 Chronic kidney disease, stage 3 unspecified: Secondary | ICD-10-CM

## 2018-02-16 DIAGNOSIS — E8881 Metabolic syndrome: Secondary | ICD-10-CM | POA: Diagnosis not present

## 2018-02-16 DIAGNOSIS — G8191 Hemiplegia, unspecified affecting right dominant side: Secondary | ICD-10-CM

## 2018-02-16 DIAGNOSIS — R0989 Other specified symptoms and signs involving the circulatory and respiratory systems: Secondary | ICD-10-CM | POA: Diagnosis not present

## 2018-02-16 DIAGNOSIS — E559 Vitamin D deficiency, unspecified: Secondary | ICD-10-CM

## 2018-02-16 DIAGNOSIS — M25512 Pain in left shoulder: Secondary | ICD-10-CM

## 2018-02-16 DIAGNOSIS — R7309 Other abnormal glucose: Secondary | ICD-10-CM

## 2018-02-16 DIAGNOSIS — E782 Mixed hyperlipidemia: Secondary | ICD-10-CM

## 2018-02-16 DIAGNOSIS — Z79899 Other long term (current) drug therapy: Secondary | ICD-10-CM | POA: Insufficient documentation

## 2018-02-16 DIAGNOSIS — Z23 Encounter for immunization: Secondary | ICD-10-CM | POA: Diagnosis not present

## 2018-02-16 HISTORY — DX: Insulin resistance, unspecified: E88.819

## 2018-02-16 HISTORY — DX: Metabolic syndrome: E88.81

## 2018-02-16 MED ORDER — PREDNISONE 20 MG PO TABS: ORAL_TABLET | ORAL | 0 refills | Status: DC

## 2018-02-17 LAB — COMPLETE METABOLIC PANEL WITH GFR
AG Ratio: 1.5 (calc) (ref 1.0–2.5)
ALBUMIN MSPROF: 4.1 g/dL (ref 3.6–5.1)
ALT: 16 U/L (ref 6–29)
AST: 17 U/L (ref 10–35)
Alkaline phosphatase (APISO): 111 U/L (ref 33–130)
BILIRUBIN TOTAL: 0.3 mg/dL (ref 0.2–1.2)
BUN / CREAT RATIO: 26 (calc) — AB (ref 6–22)
BUN: 32 mg/dL — AB (ref 7–25)
CHLORIDE: 110 mmol/L (ref 98–110)
CO2: 28 mmol/L (ref 20–32)
CREATININE: 1.23 mg/dL — AB (ref 0.50–0.99)
Calcium: 9.6 mg/dL (ref 8.6–10.4)
GFR, EST AFRICAN AMERICAN: 55 mL/min/{1.73_m2} — AB (ref >=60)
GFR, Est Non African American: 48 mL/min/{1.73_m2} — ABNORMAL LOW (ref >=60)
GLUCOSE: 94 mg/dL (ref 65–99)
Globulin: 2.8 g/dL (calc) (ref 1.9–3.7)
POTASSIUM: 5.1 mmol/L (ref 3.5–5.3)
Sodium: 142 mmol/L (ref 135–146)
TOTAL PROTEIN: 6.9 g/dL (ref 6.1–8.1)

## 2018-02-17 LAB — LIPID PANEL
CHOLESTEROL: 177 mg/dL (ref <200)
HDL: 38 mg/dL — AB (ref >50)
LDL Cholesterol (Calc): 103 mg/dL (calc) — ABNORMAL HIGH
Non-HDL Cholesterol (Calc): 139 mg/dL (calc) — ABNORMAL HIGH (ref <130)
Total CHOL/HDL Ratio: 4.7 (calc) (ref <5.0)
Triglycerides: 235 mg/dL — ABNORMAL HIGH (ref <150)

## 2018-02-17 LAB — CBC WITH DIFFERENTIAL/PLATELET
Basophils Absolute: 38 cells/uL (ref 0–200)
Basophils Relative: 0.7 %
Eosinophils Absolute: 70 cells/uL (ref 15–500)
Eosinophils Relative: 1.3 %
HCT: 36.9 % (ref 35.0–45.0)
Hemoglobin: 12.5 g/dL (ref 11.7–15.5)
Lymphs Abs: 1528 cells/uL (ref 850–3900)
MCH: 31.3 pg (ref 27.0–33.0)
MCHC: 33.9 g/dL (ref 32.0–36.0)
MCV: 92.3 fL (ref 80.0–100.0)
MPV: 11.1 fL (ref 7.5–12.5)
Monocytes Relative: 8.1 %
NEUTROS PCT: 61.6 %
Neutro Abs: 3326 cells/uL (ref 1500–7800)
PLATELETS: 235 10*3/uL (ref 140–400)
RBC: 4 10*6/uL (ref 3.80–5.10)
RDW: 11.9 % (ref 11.0–15.0)
TOTAL LYMPHOCYTE: 28.3 %
WBC: 5.4 10*3/uL (ref 3.8–10.8)
WBCMIX: 437 {cells}/uL (ref 200–950)

## 2018-02-17 LAB — TSH: TSH: 1.74 mIU/L (ref 0.40–4.50)

## 2018-02-17 LAB — HEMOGLOBIN A1C
EAG (MMOL/L): 5.8 (calc)
Hgb A1c MFr Bld: 5.3 % of total Hgb (ref <5.7)
MEAN PLASMA GLUCOSE: 105 (calc)

## 2018-02-17 LAB — INSULIN, RANDOM: Insulin: 4.4 u[IU]/mL (ref 2.0–19.6)

## 2018-02-17 LAB — VITAMIN D 25 HYDROXY (VIT D DEFICIENCY, FRACTURES): VIT D 25 HYDROXY: 75 ng/mL (ref 30–100)

## 2018-02-17 LAB — MAGNESIUM: MAGNESIUM: 1.9 mg/dL (ref 1.5–2.5)

## 2018-02-19 ENCOUNTER — Encounter: Payer: Self-pay | Admitting: Internal Medicine

## 2018-02-19 MED ORDER — TOPIRAMATE 200 MG PO TABS: ORAL_TABLET | ORAL | 1 refills | Status: DC

## 2018-05-18 ENCOUNTER — Other Ambulatory Visit: Payer: Self-pay | Admitting: Gynecology

## 2018-05-18 DIAGNOSIS — Z1231 Encounter for screening mammogram for malignant neoplasm of breast: Secondary | ICD-10-CM

## 2018-05-22 NOTE — Progress Notes (Signed)
MEDICARE ANNUAL WELLNESS VISIT AND FOLLOW UP  Assessment:   Encounter for routine adult health examination without abnormal findings  Labile hypertension Monitor blood pressure at home; call if consistently over 130/80 Continue DASH diet.   Reminder to go to the ER if any CP, SOB, nausea, dizziness, severe HA, changes vision/speech, left arm numbness and tingling and jaw pain.  Partial symptomatic epilepsy with complex partial seizures, intractable, without status epilepticus ([place]) Followed by Dr. Marlou Sa, last seizure remote  Vitamin D deficiency -     VITAMIN D 25 Hydroxy (Vit-D Deficiency, Fractures)  Hyperlipidemia, mixed Continue low cholesterol diet and exercise.  Check lipid panel.  -     Lipid panel -     TSH  Abnormal glucose -     CMP/GFR  BMI 24.0-24.9, adult  Medication management -     CBC with Differential/Platelet -     COMPLETE METABOLIC PANEL WITH GFR -     Magnesium  Right hemiparesis (HCC) Continue with cane, PT as needed  CKD (chronic kidney disease) stage 3, GFR 30-59 ml/min (HCC) Increase fluids, avoid NSAIDS, monitor sugars, will monitor  Osteopenia - get after 01/2020, continue Vit D and Ca, weight bearing exercises   Over 40 minutes of exam, counseling, chart review and critical decision making was performed Future Appointments  Date Time Provider [place]  06/29/2018 10:40 AM [facility] 3 [facility]  08/22/2018 10:00 AM Liane Comber, NP [facility] None     Plan:   During the course of the visit the patient was educated and counseled about appropriate screening and preventive services including:    Pneumococcal vaccine   Prevnar 13  Influenza vaccine  Td vaccine  Screening electrocardiogram  Bone densitometry screening  Colorectal cancer screening  Diabetes screening  Glaucoma screening  Nutrition counseling   Advanced directives: requested   Subjective:  Tonya Thompson is a 63 y.o. female who  presents for Medicare Annual Wellness Visit and 3 month follow up.   Follows with Dr. Duwayne Heck for history of seizure disorder predating to age 23 yo and Brain AVM at age 28 yo with a 1st stroke at age 57 (71)  And 3 more strokes by age 46 yo (64) when she had he 1st surg for AVM. Patient has residual R hemiparesis. Her last seizure reportedly was circa 1988. She is on topamax.    She moved into an apartment last year in June 2019 after living with her uncle for several months; she is very happy with this change and reports she is doing well. She was placed on lexapro due to depression and agitation r/t problems while living with family, but wishes to continue this medication as she feels there continues to be benefit.   BMI is Body mass index is 24.58 kg/m., she has not been working on diet and exercise, but plans to restart a stretching/muscle training regimen.  Wt Readings from Last 3 Encounters:  05/23/18 150 lb (68 kg)  02/16/18 148 lb (67.1 kg)  12/06/17 148 lb (67.1 kg)   Her blood pressure has not been controlled at home, today their BP is BP: 104/64  She does not workout. She denies chest pain, shortness of breath, dizziness.   She is not on cholesterol medication, taking omega 3 supplement only and denies myalgias. Her cholesterol is not at goal. The cholesterol last visit was:   Lab Results  Component Value Date   CHOL 177 02/16/2018   HDL 38 (L) 02/16/2018  LDLCALC 103 (H) 02/16/2018   TRIG 235 (H) 02/16/2018   CHOLHDL 4.7 02/16/2018    She has been working on diet and exercise for glucose management, and denies polydipsia and polyuria. Last A1C in the office was:  Lab Results  Component Value Date   HGBA1C 5.3 02/16/2018   Patient is on Vitamin D supplement.   Lab Results  Component Value Date   VD25OH 75 02/16/2018     Lab Results  Component Value Date   GFRNONAA 48 (L) 02/16/2018     Medication Review: Current Outpatient Medications on File Prior  to Visit  Medication Sig Dispense Refill  . baclofen (LIORESAL) 10 MG tablet Take 10 mg by mouth.    . butalbital-acetaminophen-caffeine (FIORICET, ESGIC) 50-325-40 MG tablet Take by mouth 2 (two) times daily as needed for headache.    . Calcium Carbonate Antacid (TUMS PO) Take by mouth.    . Calcium Carbonate-Vitamin D 600-400 MG-UNIT tablet Take by mouth.    . Cholecalciferol (VITAMIN D3) 2000 units capsule Take by mouth 2 (two) times daily.     Marland Kitchen escitalopram (LEXAPRO) 10 MG tablet TAKE 1 TABLET BY MOUTH EVERY DAY 90 tablet 1  . fexofenadine (ALLEGRA) 180 MG tablet Take 180 mg by mouth.    . Multiple Vitamins-Minerals (MULTIVITAMIN WITH MINERALS) tablet Take by mouth.    . Omega-3 Fatty Acids (RA FISH OIL) 1000 MG CAPS Take by mouth.    . Potassium 99 MG TABS Take by mouth.    . topiramate (TOPAMAX) 200 MG tablet Takes 1 &1/2 tabs 2 x /day    (3 tabs /day) 270 tablet 1  . predniSONE (DELTASONE) 20 MG tablet 1 tab 3 x day for 3 days, then 1 tab 2 x day for 3 days, then 1 tab 1 x day for 5 days 20 tablet 0   No current facility-administered medications on file prior to visit.     Allergies  Allergen Reactions  . Aspirin Other (See Comments)    CONTRAINDICATED DUE TO HISTORY OF BLEEDING IN BRAIN    Current Problems (verified) Patient Active Problem List   Diagnosis Date Noted  . Insulin resistance 02/16/2018  . Medication management 02/16/2018  . Osteopenia 01/10/2018  . CKD (chronic kidney disease) stage 3, GFR 30-59 ml/min (HCC) 11/10/2017  . 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 Hyperlipidemia, mixed 06/01/2017  . Abnormal glucose 06/01/2017  . Vitamin D deficiency 06/01/2017  . Epilepsy, grand mal ([place]) 11/15/2016  . 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 Headache, unspecified headache type 04/14/2016  . Right hemiparesis ([place]) 12/29/2015    Screening Tests Immunization History  Administered Date(s) Administered  . Influenza Inj Mdck  Quad With Preservative 02/16/2018  . Influenza-Unspecified 03/01/2017  . Pneumococcal-Unspecified 05/03/2004  . Tdap 08/11/2009  . Zoster 04/14/2012   Preventative care: Last [facility]: [facility], check 5 years Last mammogram: 06/2017, has scheduled  Last pap smear/pelvic exam: 12/06/2017, neg HPV, not sexually active, ? None further per Dr. Phineas Real DEXA: 01/10/2018 - T -1.1  Prior vaccinations: TD or Tdap: 2011 Influenza: 2019  Pneumococcal: 2006 Prevnar13: due age 27 Shingles/Zostavax: 2013  Names of Other Physician/Practitioners you currently use: 1. [facility] here for primary care 2. [facility], eye doctor, last visit 2019 3. Dr. Conley Canal, dentist, last visit 2019, goes q59m 4. Dr. ?, on [address], last visit 2019, has full derm scheduled   Patient Care Team: Unk Pinto,  MD as PCP - General (Internal Medicine)  SURGICAL HISTORY She  has a past surgical history that includes Brain surgery; Foot surgery; and Tubal ligation. FAMILY HISTORY Her family history includes Cancer in her father; Dementia in her father and paternal uncle; Diabetes in her father; Heart disease in her mother; Heart failure in her mother; Hyperlipidemia in her father and paternal uncle; Hypertension in her paternal uncle. SOCIAL HISTORY She  reports that she has never smoked. She has never used smokeless tobacco. She reports that she does not drink alcohol or use drugs.   MEDICARE WELLNESS OBJECTIVES: Physical activity: Current Exercise Habits: The patient does not participate in regular exercise at present, Exercise limited by: neurologic condition(s) Cardiac risk factors: Cardiac Risk Factors include: sedentary lifestyle Depression/mood screen:   Depression screen [facility] 2/9 05/23/2018  Decreased Interest 0  Down, Depressed, Hopeless 0  PHQ - 2 Score 0    ADLs:  In your present state of health, do you have any difficulty  performing the following activities: 05/23/2018 02/19/2018  Hearing? N N  Vision? N N  Comment R side peripheral vision is gone from previous strokes; has compensated well and does fair -  Difficulty concentrating or making decisions? N N  Walking or climbing stairs? Y N  Comment No limitations where she lives, may need help at church, getting a ramp -  Dressing or bathing? N N  Doing errands, shopping? N N  Comment driven by friends, uses [employer] if needed [employer] and eating ? N -  Using the Toilet? N -  In the past six months, have you accidently leaked urine? N -  Do you have problems with loss of bowel control? N -  Managing your Medications? N -  Managing your Finances? N -  Housekeeping or managing your Housekeeping? N -  Some recent data might be hidden     Cognitive Testing  Alert? Yes  Normal Appearance?Yes  Oriented to person? Yes  Place? Yes   Time? Yes  Recall of three objects?  Yes  Can perform simple calculations? Yes  Displays appropriate judgment?Yes  Can read the correct time from a watch face?Yes  EOL planning: Does Patient Have a Medical Advance Directive?: Yes Type of Advance Directive: Healthcare Power of Attorney, Living will Does patient want to make changes to medical advance directive?: No - Patient declined Copy of [place] in Chart?: No - copy requested  Review of Systems  Constitutional: Negative for malaise/fatigue and weight loss.  HENT: Negative for hearing loss and tinnitus.   Eyes: Negative for blurred vision and double vision.  Respiratory: Negative for cough, sputum production, shortness of breath and wheezing.   Cardiovascular: Negative for chest pain, palpitations, orthopnea, claudication, leg swelling and PND.  Gastrointestinal: Negative for abdominal pain, blood in stool, constipation, diarrhea, heartburn, melena, nausea and vomiting.  Genitourinary: Negative.   Musculoskeletal: Negative for  falls, joint pain and myalgias.  Skin: Negative for rash.  Neurological: Negative for dizziness, tingling, sensory change, weakness and headaches.  Endo/Heme/Allergies: Negative for polydipsia.  Psychiatric/Behavioral: Negative.  Negative for depression, memory loss, substance abuse and suicidal ideas. The patient is not nervous/anxious and does not have insomnia.   All other systems reviewed and are negative.  SEE ABOVE  Objective:     Today's Vitals   05/23/18 0948  BP: 104/64  Pulse: 66  Temp: (!) 97.3 F (36.3 C)  SpO2: 97%  Weight: 150 lb (68 kg)  Height:  5' 5.5" (1.664 m)   Body mass index is 24.58 kg/m.  General appearance: alert, no distress, WD/WN, female HEENT: normocephalic, sclerae anicteric, TMs pearly, nares patent, no discharge or erythema, pharynx normal Oral cavity: MMM, no lesions Neck: supple, no lymphadenopathy, no thyromegaly, no masses Heart: RRR, normal S1, S2, no murmurs Lungs: CTA bilaterally, no wheezes, rhonchi, or rales Abdomen: +bs, soft, non tender, non distended, no masses, no hepatomegaly, no splenomegaly Musculoskeletal: nontender, no swelling, no obvious deformity Extremities: no edema, no cyanosis, no clubbing Pulses: 2+ symmetric, upper and lower extremities, normal cap refill Neurological: alert, oriented x 3, CN2-12 intact, strength normal upper extremities and lower extremities left side, right side with decreased strength and spastic right hip and right hand with right ankle in brace, no cerebellar signs, gait abnormal, patient walks with cane Psychiatric: flat affect, behavior normal, pleasant   Medicare Attestation I have personally reviewed: The patient's medical and social history Their use of alcohol, tobacco or illicit drugs Their current medications and supplements The patient's functional ability including ADLs,fall risks, home safety risks, cognitive, and hearing and visual impairment Diet and physical activities Evidence  for depression or mood disorders  The patient's weight, height, BMI, and visual acuity have been recorded in the chart.  I have made referrals, counseling, and provided education to the patient based on review of the above and I have provided the patient with a written personalized care plan for preventive services.     Izora Ribas, NP   05/23/2018

## 2018-05-23 ENCOUNTER — Encounter: Payer: Self-pay | Admitting: Adult Health

## 2018-05-23 ENCOUNTER — Ambulatory Visit (INDEPENDENT_AMBULATORY_CARE_PROVIDER_SITE_OTHER): Payer: Medicare Other | Admitting: Adult Health

## 2018-05-23 VITALS — BP 104/64 | HR 66 | Temp 97.3°F | Ht 65.5 in | Wt 150.0 lb

## 2018-05-23 DIAGNOSIS — G40219 Localization-related (focal) (partial) symptomatic epilepsy and epileptic syndromes with complex partial seizures, intractable, without status epilepticus: Secondary | ICD-10-CM

## 2018-05-23 DIAGNOSIS — R6889 Other general symptoms and signs: Secondary | ICD-10-CM | POA: Diagnosis not present

## 2018-05-23 DIAGNOSIS — N183 Chronic kidney disease, stage 3 unspecified: Secondary | ICD-10-CM

## 2018-05-23 DIAGNOSIS — Z79899 Other long term (current) drug therapy: Secondary | ICD-10-CM | POA: Diagnosis not present

## 2018-05-23 DIAGNOSIS — R0989 Other specified symptoms and signs involving the circulatory and respiratory systems: Secondary | ICD-10-CM

## 2018-05-23 DIAGNOSIS — E782 Mixed hyperlipidemia: Secondary | ICD-10-CM | POA: Diagnosis not present

## 2018-05-23 DIAGNOSIS — Z0000 Encounter for general adult medical examination without abnormal findings: Secondary | ICD-10-CM

## 2018-05-23 DIAGNOSIS — E8881 Metabolic syndrome: Secondary | ICD-10-CM | POA: Diagnosis not present

## 2018-05-23 DIAGNOSIS — Z6824 Body mass index (BMI) 24.0-24.9, adult: Secondary | ICD-10-CM

## 2018-05-23 DIAGNOSIS — R7309 Other abnormal glucose: Secondary | ICD-10-CM | POA: Diagnosis not present

## 2018-05-23 DIAGNOSIS — E559 Vitamin D deficiency, unspecified: Secondary | ICD-10-CM

## 2018-05-23 DIAGNOSIS — G40309 Generalized idiopathic epilepsy and epileptic syndromes, not intractable, without status epilepticus: Secondary | ICD-10-CM | POA: Diagnosis not present

## 2018-05-23 DIAGNOSIS — M8580 Other specified disorders of bone density and structure, unspecified site: Secondary | ICD-10-CM | POA: Diagnosis not present

## 2018-05-23 DIAGNOSIS — R519 Headache, unspecified: Secondary | ICD-10-CM

## 2018-05-23 DIAGNOSIS — R51 Headache: Secondary | ICD-10-CM | POA: Diagnosis not present

## 2018-05-23 DIAGNOSIS — G8191 Hemiplegia, unspecified affecting right dominant side: Secondary | ICD-10-CM

## 2018-05-23 DIAGNOSIS — Z0001 Encounter for general adult medical examination with abnormal findings: Secondary | ICD-10-CM | POA: Diagnosis not present

## 2018-05-23 DIAGNOSIS — G40409 Other generalized epilepsy and epileptic syndromes, not intractable, without status epilepticus: Secondary | ICD-10-CM

## 2018-05-23 LAB — COMPLETE METABOLIC PANEL WITH GFR
AG RATIO: 1.5 (calc) (ref 1.0–2.5)
ALKALINE PHOSPHATASE (APISO): 98 U/L (ref 33–130)
ALT: 19 U/L (ref 6–29)
AST: 19 U/L (ref 10–35)
Albumin: 4.1 g/dL (ref 3.6–5.1)
BILIRUBIN TOTAL: 0.3 mg/dL (ref 0.2–1.2)
BUN/Creatinine Ratio: 25 (calc) — ABNORMAL HIGH (ref 6–22)
BUN: 29 mg/dL — ABNORMAL HIGH (ref 7–25)
CALCIUM: 10.2 mg/dL (ref 8.6–10.4)
CHLORIDE: 107 mmol/L (ref 98–110)
CO2: 27 mmol/L (ref 20–32)
Creat: 1.17 mg/dL — ABNORMAL HIGH (ref 0.50–0.99)
GFR, EST NON AFRICAN AMERICAN: 51 mL/min/{1.73_m2} — AB (ref >=60)
GFR, Est African American: 59 mL/min/{1.73_m2} — ABNORMAL LOW (ref >=60)
GLOBULIN: 2.8 g/dL (ref 1.9–3.7)
Glucose, Bld: 83 mg/dL (ref 65–99)
POTASSIUM: 4.2 mmol/L (ref 3.5–5.3)
SODIUM: 142 mmol/L (ref 135–146)
Total Protein: 6.9 g/dL (ref 6.1–8.1)

## 2018-05-23 LAB — LIPID PANEL
Cholesterol: 176 mg/dL (ref <200)
HDL: 41 mg/dL — AB (ref >50)
LDL Cholesterol (Calc): 98 mg/dL (calc)
NON-HDL CHOLESTEROL (CALC): 135 mg/dL — AB (ref <130)
Total CHOL/HDL Ratio: 4.3 (calc) (ref <5.0)
Triglycerides: 245 mg/dL — ABNORMAL HIGH (ref <150)

## 2018-05-23 LAB — CBC WITH DIFFERENTIAL/PLATELET
ABSOLUTE MONOCYTES: 464 {cells}/uL (ref 200–950)
BASOS ABS: 31 {cells}/uL (ref 0–200)
BASOS PCT: 0.6 %
EOS ABS: 112 {cells}/uL (ref 15–500)
Eosinophils Relative: 2.2 %
HCT: 39 % (ref 35.0–45.0)
HEMOGLOBIN: 13.2 g/dL (ref 11.7–15.5)
Lymphs Abs: 1454 cells/uL (ref 850–3900)
MCH: 31.7 pg (ref 27.0–33.0)
MCHC: 33.8 g/dL (ref 32.0–36.0)
MCV: 93.5 fL (ref 80.0–100.0)
MPV: 10.9 fL (ref 7.5–12.5)
Monocytes Relative: 9.1 %
NEUTROS ABS: 3040 {cells}/uL (ref 1500–7800)
Neutrophils Relative %: 59.6 %
Platelets: 241 10*3/uL (ref 140–400)
RBC: 4.17 10*6/uL (ref 3.80–5.10)
RDW: 11.8 % (ref 11.0–15.0)
Total Lymphocyte: 28.5 %
WBC: 5.1 10*3/uL (ref 3.8–10.8)

## 2018-05-23 LAB — TSH: TSH: 1.91 mIU/L (ref 0.40–4.50)

## 2018-05-23 NOTE — Patient Instructions (Addendum)
  Tonya Thompson , Thank you for taking time to come for your Medicare Wellness Visit. I appreciate your ongoing commitment to your health goals. Please review the following plan we discussed and let me know if I can assist you in the future.   These are the goals we discussed: Goals    . Exercise 150 min/wk Moderate Activity    . Weight (lb) < 140 lb (63.5 kg)       This is a list of the screening recommended for you and due dates:  Health Maintenance  Topic Date Due  . Mammogram  06/29/2019  . Tetanus Vaccine  08/12/2019  . Colon Cancer Screening  07/01/2020  . Pap Smear  12/06/2020  . Flu Shot  Completed  .  Hepatitis C: One time screening is recommended by Center for Disease Control  (CDC) for  adults born from [birth year] through 1963.   Discontinued  . HIV Screening  Discontinued    Know what a healthy weight is for you (roughly BMI <25) and aim to maintain this  Aim for 7+ servings of fruits and vegetables daily  65-80+ fluid ounces of water or unsweet tea for healthy kidneys  Limit to max 1 drink of alcohol per day; avoid smoking/tobacco  Limit animal fats in diet for cholesterol and heart health - choose grass fed whenever available  Avoid highly processed foods, and foods high in saturated/trans fats  Aim for low stress - take time to unwind and care for your mental health  Aim for 150 min of moderate intensity exercise weekly for heart health, and weights twice weekly for bone health  Aim for 7-9 hours of sleep daily

## 2018-05-25 ENCOUNTER — Other Ambulatory Visit: Payer: Self-pay | Admitting: Adult Health

## 2018-05-25 DIAGNOSIS — F320 Major depressive disorder, single episode, mild: Secondary | ICD-10-CM

## 2018-05-30 DIAGNOSIS — Z808 Family history of malignant neoplasm of other organs or systems: Secondary | ICD-10-CM | POA: Diagnosis not present

## 2018-05-30 DIAGNOSIS — L814 Other melanin hyperpigmentation: Secondary | ICD-10-CM | POA: Diagnosis not present

## 2018-05-30 DIAGNOSIS — Z23 Encounter for immunization: Secondary | ICD-10-CM | POA: Diagnosis not present

## 2018-05-30 DIAGNOSIS — L821 Other seborrheic keratosis: Secondary | ICD-10-CM | POA: Diagnosis not present

## 2018-05-30 DIAGNOSIS — D225 Melanocytic nevi of trunk: Secondary | ICD-10-CM | POA: Diagnosis not present

## 2018-06-20 DIAGNOSIS — G40219 Localization-related (focal) (partial) symptomatic epilepsy and epileptic syndromes with complex partial seizures, intractable, without status epilepticus: Secondary | ICD-10-CM | POA: Diagnosis not present

## 2018-06-29 ENCOUNTER — Ambulatory Visit
Admission: RE | Admit: 2018-06-29 | Discharge: 2018-06-29 | Disposition: A | Discharge status: Home / Self Care | Payer: Medicare Other | Source: Ambulatory Visit | Attending: Gynecology | Admitting: Gynecology

## 2018-06-29 DIAGNOSIS — Z1231 Encounter for screening mammogram for malignant neoplasm of breast: Secondary | ICD-10-CM | POA: Diagnosis not present

## 2018-06-29 IMAGING — MG DIGITAL SCREENING BILATERAL MAMMOGRAM WITH TOMO AND CAD
8 series · 8 of 24 positions shown · non-contrast
Comparison: Previous exam(s).

CLINICAL DATA: Screening.

EXAM:
DIGITAL SCREENING BILATERAL MAMMOGRAM WITH TOMO AND CAD

[L CC synth-2D]
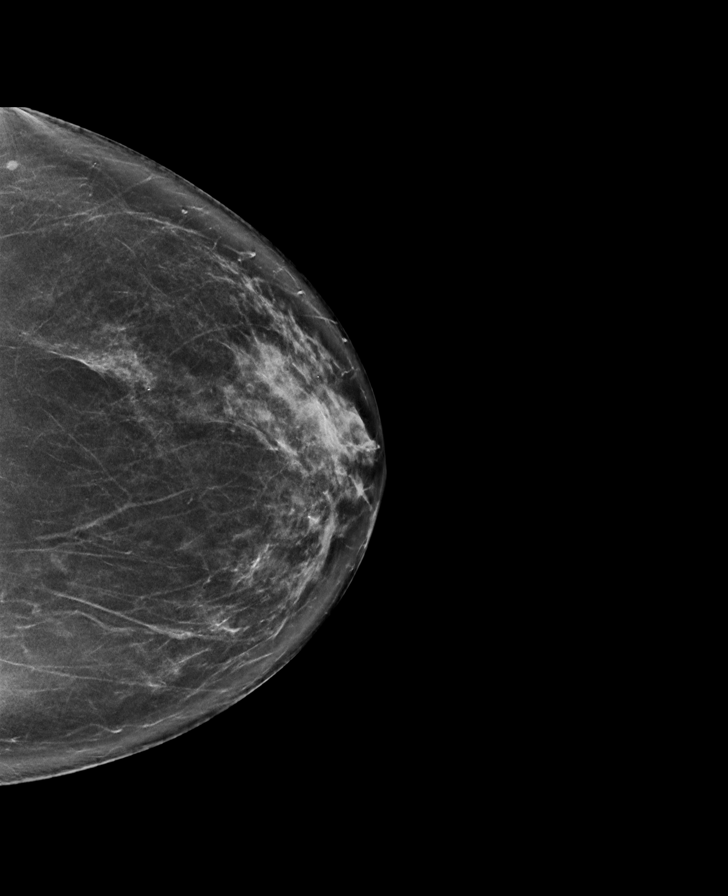

[L MLO synth-2D]
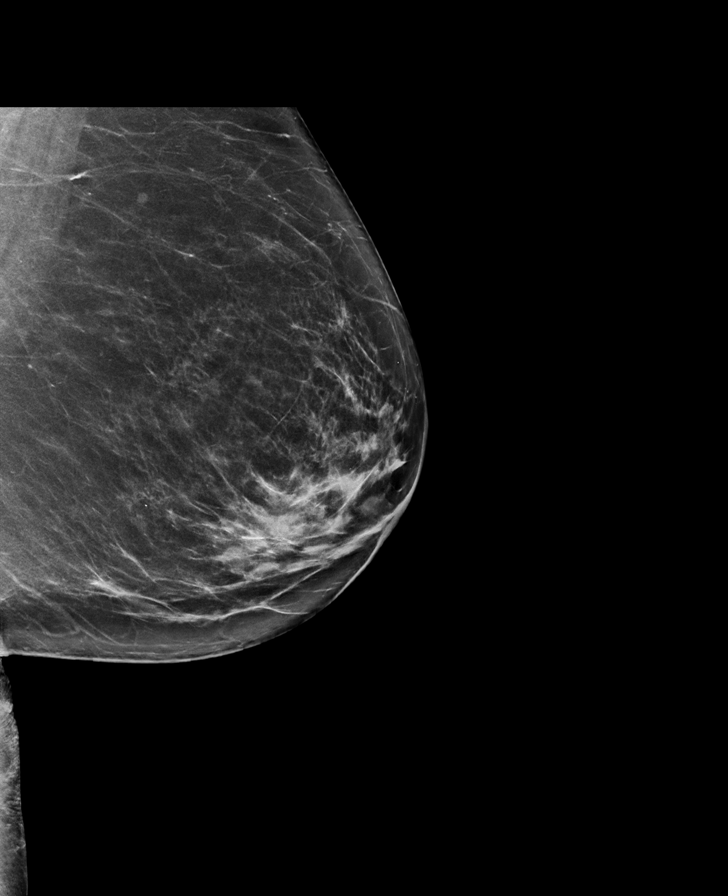

[R CC synth-2D]
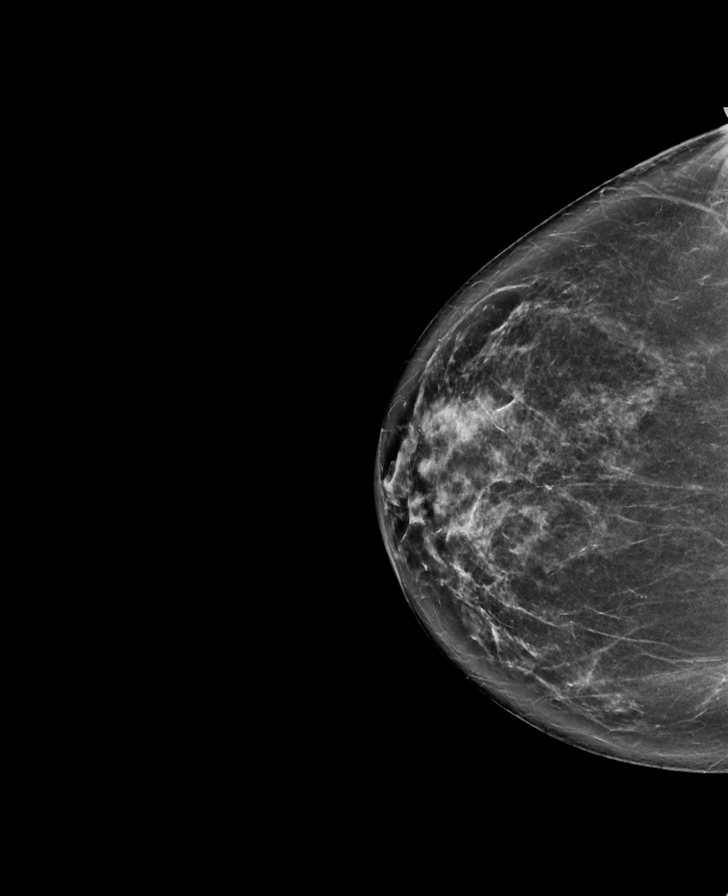

[R MLO synth-2D]
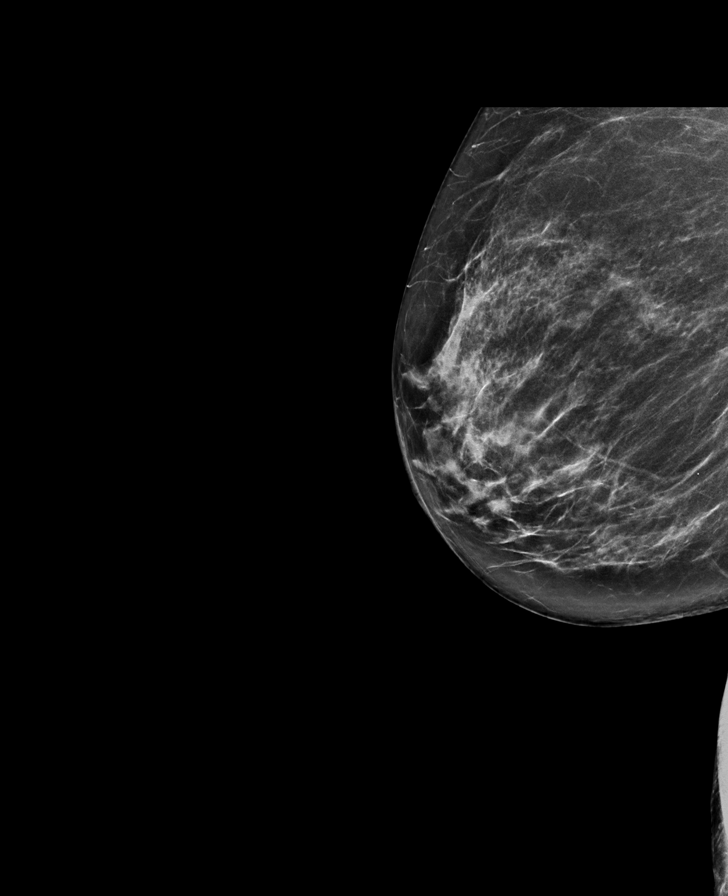

[R MLO tomo · tomo slice 39/76.0]
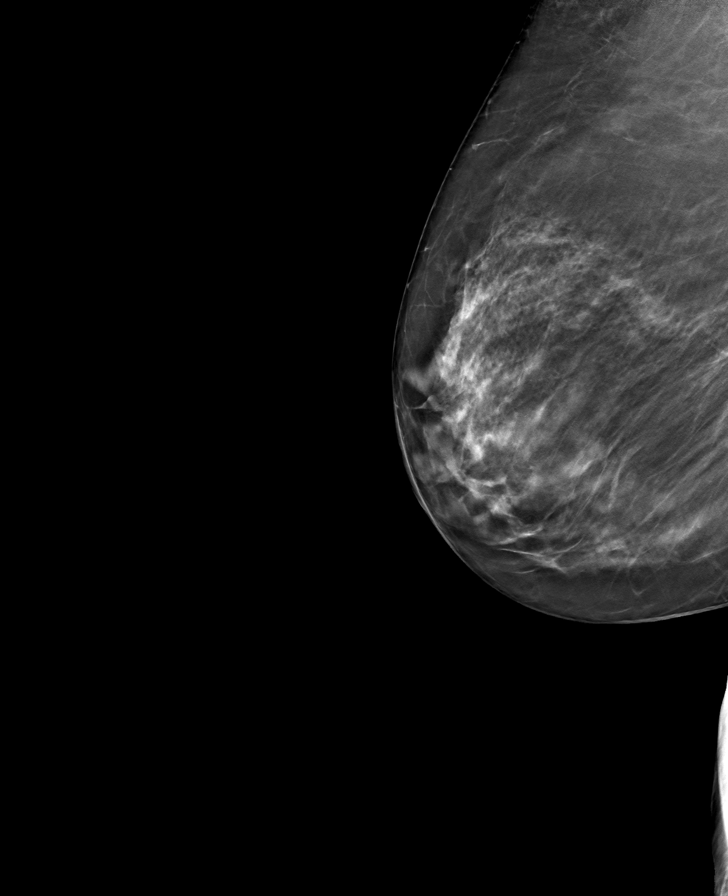

[R CC tomo · tomo slice 40/79.0]
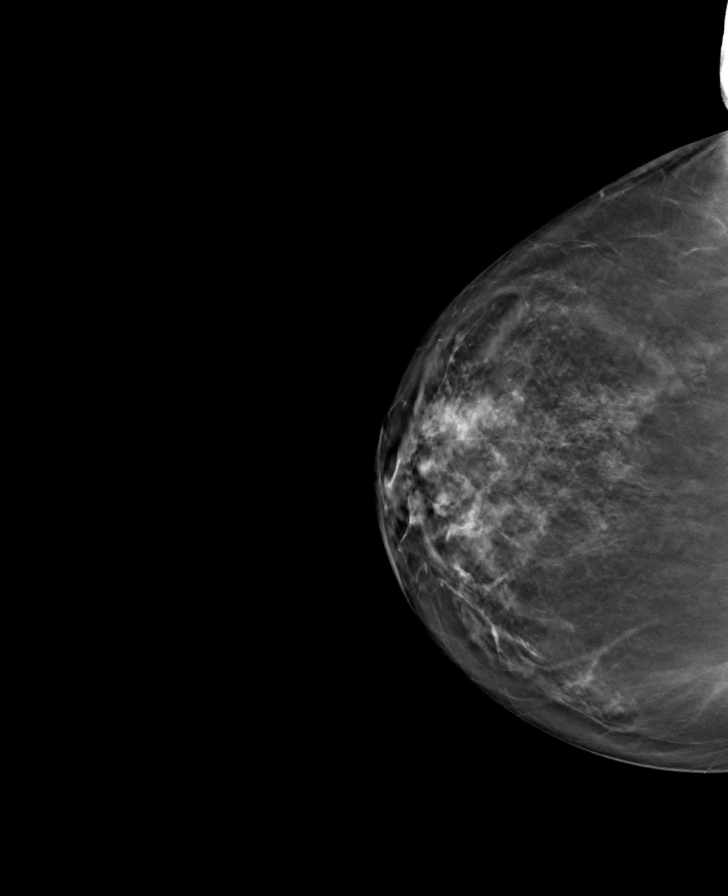

[L MLO tomo · tomo slice 42/83.0]
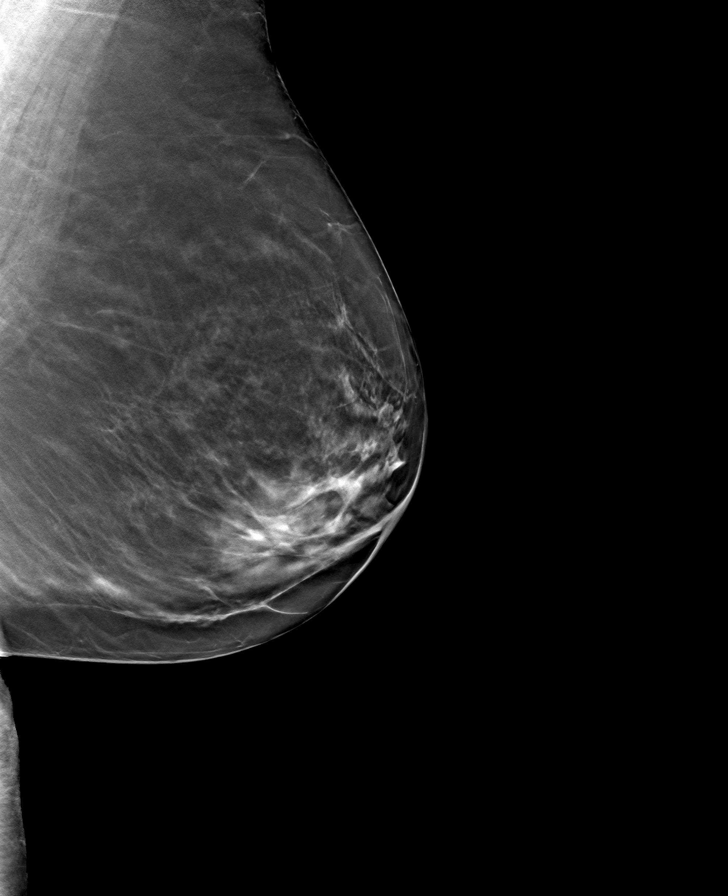

[L CC tomo · tomo slice 39/77.0]
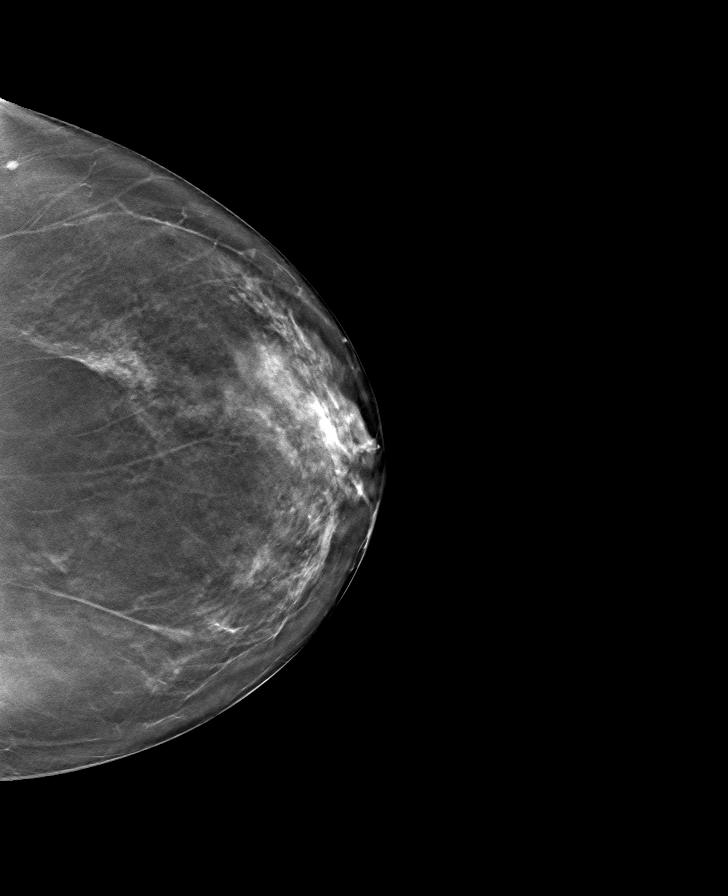

[8 of 24 positions shown; findings below may reference images not displayed]

ACR Breast Density Category c: The breast tissue is heterogeneously
dense, which may obscure small masses.
FINDINGS: There are no findings suspicious for malignancy. Images were
processed with CAD.
IMPRESSION: No mammographic evidence of malignancy. A result letter of this
screening mammogram will be mailed directly to the patient.

RECOMMENDATION:
Screening mammogram in one year. (Code:[5V])

BI-RADS CATEGORY  1: Negative.

## 2018-08-21 NOTE — Progress Notes (Signed)
Complete Physical  Assessment and Plan:   Encounter for routine adult health examination without abnormal findings  Labile hypertension Monitor blood pressure at home; call if consistently over 130/80 Continue DASH diet.   Reminder to go to the ER if any CP, SOB, nausea, dizziness, severe HA, changes vision/speech, left arm numbness and tingling and jaw pain.      EKG 12-Lead  Partial symptomatic epilepsy with complex partial seizures, intractable, without status epilepticus ([place]) Followed by Dr. Marlou Sa, last seizure remote  Vitamin D deficiency -     VITAMIN D 25 Hydroxy (Vit-D Deficiency, Fractures)  Hyperlipidemia, mixed Continue low cholesterol diet and exercise.  Check lipid panel.  -     Lipid panel -     TSH  Abnormal glucose -     Hemoglobin A1c  BMI 23.0-23.9, adult  Medication management -     CBC with Differential/Platelet -     COMPLETE METABOLIC PANEL WITH GFR -     Magnesium -     Urinalysis w microscopic + reflex cultur  Screening for hematuria or proteinuria -     Microalbumin / creatinine urine ratio  Right hemiparesis (HCC) Continue with walker, PT as needed  CKD (chronic kidney disease) stage 3, GFR 30-59 ml/min (HCC) Increase fluids, avoid NSAIDS, monitor sugars, will monitor  Osteopenia - mild, continue DEXA monitoring, continue Vit D and Ca, weight bearing exercises    Discussed med's effects and SE's. Screening labs and tests as requested with regular follow-up as recommended. Over 40 minutes of exam, counseling, chart review, and complex, high level critical decision making was performed this visit.   Future Appointments  Date Time Provider [place]  06/01/2019 10:45 AM Liane Comber, NP [facility] None  08/28/2019 10:00 AM Liane Comber, NP [facility] None     HPI  63 y.o. female  presents for a complete physical and follow up for has Epilepsy, grand mal ([place]); 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  ([place]); Headache, unspecified headache type; Right hemiparesis ([place]); Labile hypertension; Hyperlipidemia, mixed; Abnormal glucose; Vitamin D deficiency; CKD (chronic kidney disease) stage 3, GFR 30-59 ml/min ([place]); Osteopenia; Insulin resistance; and Medication management on their problem list.   Follows with 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 Patient has residual R hemiparesis. Her last seizure reportedly was circa 1988. She is on topamax.    She moved into an apartment last year after living with her uncle for several months; she is very happy with this change and reports she is doing well. She was placed on lexapro due to depression and agitation r/t problems while living with family, but wishes to continue this medication as she feels there continues to be benefit.    BMI is Body mass index is 27.33 kg/m., she has not been working on diet and exercise, but has started logging her food, attributes weight gain to current stay at home order. She typically walks but has been unable to do as much exercise with covid 19 and weather. Wt Readings from Last 3 Encounters:  08/22/18 166 lb 12.8 oz (75.7 kg)  05/23/18 150 lb (68 kg)  02/16/18 148 lb (67.1 kg)   Today their BP is BP: 104/64 She does workout. She denies  chest pain, shortness of breath, dizziness.   She is not on cholesterol medication and denies myalgias. Her cholesterol is at goal. The cholesterol last visit was:   Lab Results  Component Value Date   CHOL 176 05/23/2018   HDL 41 (L) 05/23/2018   LDLCALC 98 05/23/2018   TRIG 245 (H) 05/23/2018   CHOLHDL 4.3 05/23/2018    Last A1C in the office was:  Lab Results  Component Value Date   HGBA1C 5.3 02/16/2018   Last GFR: Lab Results  Component Value Date   GFRNONAA 51 (L)  05/23/2018   Patient is on Vitamin D supplement.   Lab Results  Component Value Date   VD25OH 75 02/16/2018      Lab Results  Component Value Date   [id number] 009 11/10/2017       Current Medications:  Current Outpatient Medications on File Prior to Visit  Medication Sig Dispense Refill  . baclofen (LIORESAL) 10 MG tablet Take 10 mg by mouth.    . butalbital-acetaminophen-caffeine (FIORICET, ESGIC) 50-325-40 MG tablet Take by mouth 2 (two) times daily as needed for headache.    . Calcium Carbonate Antacid (TUMS PO) Take by mouth.    . Calcium Carbonate-Vitamin D 600-400 MG-UNIT tablet Take by mouth.    . Cholecalciferol (VITAMIN D3) 2000 units capsule Take by mouth 2 (two) times daily.     Marland Kitchen escitalopram (LEXAPRO) 10 MG tablet TAKE 1 TABLET BY MOUTH EVERY DAY 90 tablet 1  . fexofenadine (ALLEGRA) 180 MG tablet Take 180 mg by mouth.    . Multiple Vitamins-Minerals (MULTIVITAMIN WITH MINERALS) tablet Take by mouth.    . Omega-3 Fatty Acids (RA FISH OIL) 1000 MG CAPS Take by mouth.    . Potassium 99 MG TABS Take by mouth.    . topiramate (TOPAMAX) 200 MG tablet Takes 1 &1/2 tabs 2 x /day    (3 tabs /day) 270 tablet 1  . predniSONE (DELTASONE) 20 MG tablet 1 tab 3 x day for 3 days, then 1 tab 2 x day for 3 days, then 1 tab 1 x day for 5 days 20 tablet 0   No current facility-administered medications on file prior to visit.    Allergies:  Allergies  Allergen Reactions  . 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 Health Maintenance:    Immunization History  Administered Date(s) Administered  . Influenza Inj Mdck Quad With Preservative 02/16/2018  . Influenza-Unspecified 03/01/2017  . Pneumococcal-Unspecified 05/03/2004  . Tdap 08/11/2009  . Zoster 04/14/2012    Preventative care: Last [facility]: [facility], due 5 years, will need referral to local provider  Last mammogram: 06/2018 Last pap smear/pelvic exam: 12/2017 DONE DEXA: 01/2018 L fem T -1.1  Prior vaccinations: TD or Tdap: 2011 Influenza: 2019  Pneumococcal: 2006 Prevnar13: due age 13 Shingles/Zostavax: 2013  Names of Other Physician/Practitioners you currently use: 1. [facility] here for primary care 2. [facility], eye doctor, last visit YEARLY, glasses 3. Dr. Conley Canal, dentist, last visit 08/2017, will schedule after covid 19  4. Dr. Marland Kitchen [facility] [place]  [place], derm, 05/2018, goes annually  5. Dr. Adolphus Birchwood, neurology, last visiti 2019, goes q2 years  Patient Care Team: Unk Pinto, MD as PCP - General (Internal Medicine)  Surgical History:  She has a past surgical history that includes Brain surgery (09/19/1978); Foot surgery (Right, 10/1985); and Tubal ligation. Family History:  Herfamily history includes Cancer in her father; Dementia in her father and paternal uncle; Diabetes in her father; Heart disease in her mother; Heart failure in her mother; Hyperlipidemia in her father and paternal uncle; Hypertension in her paternal uncle. Social History:  She reports that she has never smoked. She has never used smokeless tobacco. She reports that she does not drink alcohol or use drugs.  Review of Systems: Review of Systems  Constitutional: Negative for malaise/fatigue and weight loss.  HENT: Negative for hearing loss and tinnitus.   Eyes: Negative for blurred vision and double vision.  Respiratory: Negative for cough, sputum production, shortness of  breath and wheezing.   Cardiovascular: Negative for chest pain, palpitations, orthopnea, claudication, leg swelling and PND.  Gastrointestinal: Negative for abdominal pain, blood in stool, constipation, diarrhea, heartburn, melena, nausea and vomiting.  Genitourinary: Negative.   Musculoskeletal: Positive for falls (r/t R hemiparesis, no injury). Negative for joint pain and myalgias.  Skin: Negative for rash.  Neurological: Positive for headaches. Negative for dizziness, tingling, sensory change and weakness.  Endo/Heme/Allergies: Negative for polydipsia.  Psychiatric/Behavioral: Negative.  Negative for depression, memory loss, substance abuse and suicidal ideas. The patient is not nervous/anxious and does not have insomnia.   All other systems reviewed and are negative.   Physical Exam: Estimated body mass index is 27.33 kg/m as calculated from the following:   Height as of this encounter: 5' 5.5" (1.664 m).   Weight as of this encounter: 166 lb 12.8 oz (75.7 kg). BP 104/64   Pulse 72   Temp (!) 97.5 F (36.4 C)   Ht 5' 5.5" (1.664 m)   Wt 166 lb 12.8 oz (75.7 kg)   SpO2 97%   BMI 27.33 kg/m  General Appearance: Well nourished, in no apparent distress.  Eyes: PERRLA, EOMs, conjunctiva no swelling or erythema Sinuses: No Frontal/maxillary tenderness  ENT/Mouth: Ext aud canals clear, normal light reflex with TMs without erythema, bulging. Good dentition. No erythema, swelling, or exudate on post pharynx. Tonsils not swollen or erythematous. Hearing normal with bilateral hearing aids Neck: Supple, thyroid normal. No bruits  Respiratory: Respiratory effort normal, BS equal bilaterally without rales, rhonchi, wheezing or stridor.   Cardio: RRR without murmurs, rubs or gallops. Brisk peripheral pulses without edema.  Chest: symmetric, with normal excursions and percussion.  Breasts: Symmetric, without lumps, nipple discharge, retractions.  Abdomen: Soft, nontender, no guarding, rebound,  hernias, masses, or organomegaly.  Lymphatics: Non tender without lymphadenopathy.  Genitourinary: Normal, no lesions, Bi-manual examination: Non-tender; no adenxal masses or nodularity  Musculoskeletal: Poor ROM through R upper and lower extremities, contracture of right hand present, 5/5 strength of left, and Rt Hp with limping gait. Has rolling walker Skin: Warm, dry without rashes, lesions, ecchymosis. Neuro: Cranial nerves intact, reflexes equal bilaterally. Normal muscle tone of left, Sensation intact.  Psych: Awake and oriented X 3, normal affect, Insight and Judgment fair.   EKG: WNL no ST changes.  Gorden Harms Gwyneth Fernandez 10:26 AM [facility]

## 2018-08-22 ENCOUNTER — Ambulatory Visit (INDEPENDENT_AMBULATORY_CARE_PROVIDER_SITE_OTHER): Payer: Medicare Other | Admitting: Adult Health

## 2018-08-22 ENCOUNTER — Other Ambulatory Visit: Payer: Self-pay

## 2018-08-22 ENCOUNTER — Encounter: Payer: Self-pay | Admitting: Adult Health

## 2018-08-22 VITALS — BP 104/64 | HR 72 | Temp 97.5°F | Ht 65.5 in | Wt 166.8 lb

## 2018-08-22 DIAGNOSIS — E782 Mixed hyperlipidemia: Secondary | ICD-10-CM

## 2018-08-22 DIAGNOSIS — Z0000 Encounter for general adult medical examination without abnormal findings: Secondary | ICD-10-CM | POA: Diagnosis not present

## 2018-08-22 DIAGNOSIS — I10 Essential (primary) hypertension: Secondary | ICD-10-CM

## 2018-08-22 DIAGNOSIS — M8580 Other specified disorders of bone density and structure, unspecified site: Secondary | ICD-10-CM

## 2018-08-22 DIAGNOSIS — R0989 Other specified symptoms and signs involving the circulatory and respiratory systems: Secondary | ICD-10-CM

## 2018-08-22 DIAGNOSIS — R51 Headache: Secondary | ICD-10-CM

## 2018-08-22 DIAGNOSIS — G8191 Hemiplegia, unspecified affecting right dominant side: Secondary | ICD-10-CM

## 2018-08-22 DIAGNOSIS — Z6824 Body mass index (BMI) 24.0-24.9, adult: Secondary | ICD-10-CM

## 2018-08-22 DIAGNOSIS — E559 Vitamin D deficiency, unspecified: Secondary | ICD-10-CM | POA: Diagnosis not present

## 2018-08-22 DIAGNOSIS — N183 Chronic kidney disease, stage 3 unspecified: Secondary | ICD-10-CM

## 2018-08-22 DIAGNOSIS — F320 Major depressive disorder, single episode, mild: Secondary | ICD-10-CM

## 2018-08-22 DIAGNOSIS — E8881 Metabolic syndrome: Secondary | ICD-10-CM

## 2018-08-22 DIAGNOSIS — Z79899 Other long term (current) drug therapy: Secondary | ICD-10-CM | POA: Diagnosis not present

## 2018-08-22 DIAGNOSIS — Z136 Encounter for screening for cardiovascular disorders: Secondary | ICD-10-CM

## 2018-08-22 DIAGNOSIS — D649 Anemia, unspecified: Secondary | ICD-10-CM

## 2018-08-22 DIAGNOSIS — R519 Headache, unspecified: Secondary | ICD-10-CM

## 2018-08-22 DIAGNOSIS — G40409 Other generalized epilepsy and epileptic syndromes, not intractable, without status epilepticus: Secondary | ICD-10-CM

## 2018-08-22 DIAGNOSIS — R7309 Other abnormal glucose: Secondary | ICD-10-CM | POA: Diagnosis not present

## 2018-08-22 DIAGNOSIS — G40219 Localization-related (focal) (partial) symptomatic epilepsy and epileptic syndromes with complex partial seizures, intractable, without status epilepticus: Secondary | ICD-10-CM

## 2018-08-22 DIAGNOSIS — G40309 Generalized idiopathic epilepsy and epileptic syndromes, not intractable, without status epilepticus: Secondary | ICD-10-CM

## 2018-08-22 MED ORDER — ESCITALOPRAM OXALATE 10 MG PO TABS: 10.0000 mg | ORAL_TABLET | Freq: Every day | ORAL | 1 refills | Status: DC

## 2018-08-22 NOTE — Patient Instructions (Addendum)
  Tonya Thompson , Thank you for taking time to come for your Annual Wellness Visit. I appreciate your ongoing commitment to your health goals. Please review the following plan we discussed and let me know if I can assist you in the future.   These are the goals we discussed: Goals    . Exercise 150 min/wk Moderate Activity    . Weight (lb) < 140 lb (63.5 kg)       This is a list of the screening recommended for you and due dates:  Health Maintenance  Topic Date Due  . Flu Shot  12/02/2018  . Tetanus Vaccine  08/12/2019  . Mammogram  06/29/2020  . Colon Cancer Screening  07/01/2020  . Pap Smear  12/06/2020  .  Hepatitis C: One time screening is recommended by Center for Disease Control  (CDC) for  adults born from [birth year] through 1963.   Discontinued  . HIV Screening  Discontinued     Know what a healthy weight is for you (roughly BMI <25) and aim to maintain this  Aim for 7+ servings of fruits and vegetables daily  65-80+ fluid ounces of water or unsweet tea for healthy kidneys  Limit to max 1 drink of alcohol per day; avoid smoking/tobacco  Limit animal fats in diet for cholesterol and heart health - choose grass fed whenever available  Avoid highly processed foods, and foods high in saturated/trans fats  Aim for low stress - take time to unwind and care for your mental health  Aim for 150 min of moderate intensity exercise weekly for heart health, and weights twice weekly for bone health  Aim for 7-9 hours of sleep daily        When it comes to diets, agreement about the perfect plan isn't easy to find, even among the experts. Experts at the [employer] developed an idea known as the Healthy Eating Plate. Just imagine a plate divided into logical, healthy portions.  The emphasis is on diet quality:  Load up on vegetables and fruits - one-half of your plate: Aim for color and variety, and remember that potatoes don't count.  Go for whole grains -  one-quarter of your plate: Whole wheat, barley, wheat berries, quinoa, oats, brown rice, and foods made with them. If you want pasta, go with whole wheat pasta.  Protein power - one-quarter of your plate: Fish, chicken, beans, and nuts are all healthy, versatile protein sources. Limit red meat.  The diet, however, does go beyond the plate, offering a few other suggestions.  Use healthy plant oils, such as olive, canola, soy, corn, sunflower and peanut. Check the labels, and avoid partially hydrogenated oil, which have unhealthy trans fats.  If you're thirsty, drink water. Coffee and tea are good in moderation, but skip sugary drinks and limit milk and dairy products to one or two daily servings.  The type of carbohydrate in the diet is more important than the amount. Some sources of carbohydrates, such as vegetables, fruits, whole grains, and beans-are healthier than others.  Finally, stay active.

## 2018-08-23 LAB — COMPLETE METABOLIC PANEL WITH GFR
AG Ratio: 1.4 (calc) (ref 1.0–2.5)
ALT: 28 U/L (ref 6–29)
AST: 23 U/L (ref 10–35)
Albumin: 4.3 g/dL (ref 3.6–5.1)
Alkaline phosphatase (APISO): 112 U/L (ref 37–153)
BUN/Creatinine Ratio: 29 (calc) — ABNORMAL HIGH (ref 6–22)
BUN: 31 mg/dL — ABNORMAL HIGH (ref 7–25)
CO2: 27 mmol/L (ref 20–32)
Calcium: 10.1 mg/dL (ref 8.6–10.4)
Chloride: 105 mmol/L (ref 98–110)
Creat: 1.07 mg/dL — ABNORMAL HIGH (ref 0.50–0.99)
GFR, Est African American: 65 mL/min/{1.73_m2} (ref >=60)
GFR, Est Non African American: 56 mL/min/{1.73_m2} — ABNORMAL LOW (ref >=60)
Globulin: 3 g/dL (calc) (ref 1.9–3.7)
Glucose, Bld: 83 mg/dL (ref 65–99)
Potassium: 3.8 mmol/L (ref 3.5–5.3)
Sodium: 139 mmol/L (ref 135–146)
Total Bilirubin: 0.4 mg/dL (ref 0.2–1.2)
Total Protein: 7.3 g/dL (ref 6.1–8.1)

## 2018-08-23 LAB — URINALYSIS, ROUTINE W REFLEX MICROSCOPIC
Bilirubin Urine: NEGATIVE
Glucose, UA: NEGATIVE
Hyaline Cast: NONE SEEN /LPF
Ketones, ur: NEGATIVE
Nitrite: NEGATIVE
Protein, ur: NEGATIVE
Specific Gravity, Urine: 1.009 (ref 1.001–1.03)
Squamous Epithelial / LPF: NONE SEEN /HPF (ref <=5)
pH: 8 (ref 5.0–8.0)

## 2018-08-23 LAB — LIPID PANEL
Cholesterol: 221 mg/dL — ABNORMAL HIGH (ref <200)
HDL: 45 mg/dL — ABNORMAL LOW (ref >=50)
LDL Cholesterol (Calc): 140 mg/dL (calc) — ABNORMAL HIGH
Non-HDL Cholesterol (Calc): 176 mg/dL (calc) — ABNORMAL HIGH (ref <130)
Total CHOL/HDL Ratio: 4.9 (calc) (ref <5.0)
Triglycerides: 223 mg/dL — ABNORMAL HIGH (ref <150)

## 2018-08-23 LAB — CBC WITH DIFFERENTIAL/PLATELET
Absolute Monocytes: 510 cells/uL (ref 200–950)
Basophils Absolute: 39 cells/uL (ref 0–200)
Basophils Relative: 0.7 %
Eosinophils Absolute: 62 cells/uL (ref 15–500)
Eosinophils Relative: 1.1 %
HCT: 39.1 % (ref 35.0–45.0)
Hemoglobin: 13.2 g/dL (ref 11.7–15.5)
Lymphs Abs: 1506 cells/uL (ref 850–3900)
MCH: 31.1 pg (ref 27.0–33.0)
MCHC: 33.8 g/dL (ref 32.0–36.0)
MCV: 92.2 fL (ref 80.0–100.0)
MPV: 10.3 fL (ref 7.5–12.5)
Monocytes Relative: 9.1 %
Neutro Abs: 3483 cells/uL (ref 1500–7800)
Neutrophils Relative %: 62.2 %
Platelets: 260 10*3/uL (ref 140–400)
RBC: 4.24 10*6/uL (ref 3.80–5.10)
RDW: 12 % (ref 11.0–15.0)
Total Lymphocyte: 26.9 %
WBC: 5.6 10*3/uL (ref 3.8–10.8)

## 2018-08-23 LAB — HEMOGLOBIN A1C
Hgb A1c MFr Bld: 5.1 % of total Hgb (ref <5.7)
Mean Plasma Glucose: 100 (calc)
eAG (mmol/L): 5.5 (calc)

## 2018-08-23 LAB — VITAMIN D 25 HYDROXY (VIT D DEFICIENCY, FRACTURES): Vit D, 25-Hydroxy: 78 ng/mL (ref 30–100)

## 2018-08-23 LAB — IRON, TOTAL/TOTAL IRON BINDING CAP
%SAT: 42 % (calc) (ref 16–45)
Iron: 122 ug/dL (ref 45–160)
TIBC: 292 mcg/dL (calc) (ref 250–450)

## 2018-08-23 LAB — MAGNESIUM: Magnesium: 1.9 mg/dL (ref 1.5–2.5)

## 2018-08-23 LAB — MICROALBUMIN / CREATININE URINE RATIO
Creatinine, Urine: 27 mg/dL (ref 20–275)
Microalb Creat Ratio: 59 mcg/mg creat — ABNORMAL HIGH (ref <30)
Microalb, Ur: 1.6 mg/dL

## 2018-08-23 LAB — TSH: TSH: 2.44 mIU/L (ref 0.40–4.50)

## 2018-11-27 NOTE — Progress Notes (Signed)
History of Present Illness:       This very nice 63 y.o. single WF  presents for 3 month follow up with HTN, HLD, Pre-Diabetes and Vitamin D Deficiency.        Patient has hx/o seizure disorder (age 23 yo) and hx/o  Brain AVM at age 19 yo with a 1st stroke at age 84 (66) and 3 more strokes by age 43 yo (2) when she had he 1st surg for AVM.  Her last seizure reportedly was circa 1988. Patient follows with  Follows with Dr. Duwayne Heck in [place].  Patient has a residual mild spastic Rt HP.                                                                        Patient is followed expectantly for labile HTN & BP has been controlled at home. Today's BP is at goal - 116/78. Patient has had no complaints of any cardiac type chest pain, palpitations, dyspnea / orthopnea / PND, dizziness, claudication, or dependent edema.      Hyperlipidemia is controlled with diet & meds. Patient denies myalgias or other med SE's. Last Lipids were  Lab Results  Component Value Date   CHOL 221 (H) 08/22/2018   HDL 45 (L) 08/22/2018   LDLCALC 140 (H) 08/22/2018   TRIG 223 (H) 08/22/2018   CHOLHDL 4.9 08/22/2018       Also, the patient has history of PreDiabetes/ Insulin Resistance and has had no symptoms of reactive hypoglycemia, diabetic polys, paresthesias or visual blurring.  Last A1c was Normal & at goal: Lab Results  Component Value Date   HGBA1C 5.1 08/22/2018       Further, the patient also has history of Vitamin D Deficiency and supplements vitamin D without any suspected side-effects. Last vitamin D was at goal: Lab Results  Component Value Date   VD25OH 78 08/22/2018   Current Outpatient Medications on File Prior to Visit  Medication Sig  . baclofen (LIORESAL) 10 MG tablet Take 10 mg by mouth.  . butalbital-acetaminophen-caffeine (FIORICET, ESGIC) 50-325-40 MG tablet Take by mouth 2 (two) times daily as needed for headache.  . Calcium Carbonate Antacid (TUMS PO) Take by  mouth.  . Calcium Carbonate-Vitamin D 600-400 MG-UNIT tablet Take by mouth.  . Cholecalciferol (VITAMIN D3) 2000 units capsule Take by mouth 2 (two) times daily.   Marland Kitchen escitalopram (LEXAPRO) 10 MG tablet Take 1 tablet (10 mg total) by mouth daily.  . fexofenadine (ALLEGRA) 180 MG tablet Take 180 mg by mouth.  . Multiple Vitamins-Minerals (MULTIVITAMIN WITH MINERALS) tablet Take by mouth.  . Omega-3 Fatty Acids (RA FISH OIL) 1000 MG CAPS Take by mouth.  . Potassium 99 MG TABS Take by mouth.  . topiramate (TOPAMAX) 200 MG tablet Takes 1 &1/2 tabs 2 x /day    (3 tabs /day)   No current facility-administered medications on file prior to visit.    Allergies  Allergen Reactions  . Aspirin Other (See Comments)    CONTRAINDICATED DUE TO HISTORY OF BLEEDING IN BRAIN   PMHx:   Past Medical History:  Diagnosis Date  . AVM (arteriovenous malformation) brain   . Seizures ([place])  Grand Mal & Partial complex seizure disorder  . Stroke [facility])    Immunization History  Administered Date(s) Administered  . Influenza Inj Mdck Quad With Preservative 02/16/2018  . Influenza-Unspecified 03/01/2017  . Pneumococcal-Unspecified 05/03/2004  . Tdap 08/11/2009  . Zoster 04/14/2012   Past Surgical History:  Procedure Laterality Date  . BRAIN SURGERY  09/19/1978   at [facility], Dr. Leida Lauth  . FOOT SURGERY Right 10/1985   ankle fusion, at [facility], Dr. Lorelee Cover  . TUBAL LIGATION      FHx:    Reviewed / unchanged  SHx:    Reviewed / unchanged   Systems Review:  Constitutional: Denies fever, chills, wt changes, headaches, insomnia, fatigue, night sweats, change in appetite. Eyes: Denies redness, blurred vision, diplopia, discharge, itchy, watery eyes.  ENT: Denies discharge, congestion, post nasal drip, epistaxis, sore throat, earache, hearing loss, dental pain, tinnitus, vertigo, sinus pain, snoring.  CV: Denies chest pain, palpitations, irregular heartbeat, syncope, dyspnea, diaphoresis, orthopnea, PND,  claudication or edema. Respiratory: denies cough, dyspnea, DOE, pleurisy, hoarseness, laryngitis, wheezing.  Gastrointestinal: Denies dysphagia, odynophagia, heartburn, reflux, water brash, abdominal pain or cramps, nausea, vomiting, bloating, diarrhea, constipation, hematemesis, melena, hematochezia  or hemorrhoids. Genitourinary: Denies dysuria, frequency, urgency, nocturia, hesitancy, discharge, hematuria or flank pain. Musculoskeletal: Denies arthralgias, myalgias, stiffness, jt. swelling, pain, limping or strain/sprain.  Skin: Denies pruritus, rash, hives, warts, acne, eczema or change in skin lesion(s). Neuro: No weakness, tremor, incoordination, spasms, paresthesia or pain. Psychiatric: Denies confusion, memory loss or sensory loss. Endo: Denies change in weight, skin or hair change.  Heme/Lymph: No excessive bleeding, bruising or enlarged lymph nodes.  Physical Exam  BP 116/78   Pulse 64   Temp (!) 97.2 F (36.2 C)   Resp 16   Ht 5' 6.75" (1.695 m)   Wt 151 lb 9.6 oz (68.8 kg)   BMI 23.92 kg/m   Appears  well nourished, well groomed  and in no distress.  Eyes: PERRLA, EOMs, conjunctiva no swelling or erythema. Sinuses: No frontal/maxillary tenderness ENT/Mouth: EAC's clear, TM's nl w/o erythema, bulging. Nares clear w/o erythema, swelling, exudates. Oropharynx clear without erythema or exudates. Oral hygiene is good. Tongue normal, non obstructing. Hearing intact.  Neck: Supple. Thyroid not palpable. Car 2+/2+ without bruits, nodes or JVD. Chest: Respirations nl with BS clear & equal w/o rales, rhonchi, wheezing or stridor.  Cor: Heart sounds normal w/ regular rate and rhythm without sig. murmurs, gallops, clicks or rubs. Peripheral pulses normal and equal  without edema.  Abdomen: Soft & bowel sounds normal. Non-tender w/o guarding, rebound, hernias, masses or organomegaly.  Lymphatics: Unremarkable.  Musculoskeletal: Mild Rt spastic hemi-paresis and limping gait. Flexion  contracturing of the Rt wrist, hand & fingers Skin: Warm, dry without exposed rashes, lesions or ecchymosis apparent.  Neuro: Cranial nerves intact, reflexes equal bilaterally. Sensory-motor testing grossly intact. Tendon reflexes grossly intact.  Pysch: Alert & oriented x 3.  Insight and judgement nl & appropriate. No ideations.  Assessment and Plan:  1. Labile hypertension  - Continue monitor blood pressures - Continue DASH diet.  Reminder to go to the ER if any CP,  SOB, nausea, dizziness, severe HA, changes vision/speech.  - CBC with Differential/Platelet - COMPLETE METABOLIC PANEL WITH GFR - TSH - Magnesium  2. Hyperlipidemia, mixed - Continue diet/meds, exercise,& lifestyle modifications.  - Continue monitor periodic cholesterol/liver & renal functions   - Lipid panel - TSH  3. Abnormal glucose  - Continue diet, exercise  - Lifestyle modifications.  -  Monitor appropriate labs.  - Fructosamine  4. Vitamin D deficiency  - Continue supplementation.  - VITAMIN D 25 Hydroxyl  5. Partial symptomatic epilepsy with complex partial seizures, intractable, without status epilepticus ([place])  6. CKD (chronic kidney disease) stage 3, GFR 30-59 ml/min (HCC)  - COMPLETE METABOLIC PANEL WITH GFR  7. Medication management  - CBC with Differential/Platelet - COMPLETE METABOLIC PANEL WITH GFR - Lipid panel - TSH - VITAMIN D 25 Hydroxy - Fructosamine - Magnesium      Discussed  regular exercise, BP monitoring, weight control to achieve/maintain BMI less than 25 and discussed med and SE's. Recommended labs to assess and monitor clinical status with further disposition pending results of labs.  I discussed the assessment and treatment plan with the patient. The patient was provided an opportunity to ask questions and all were answered. The patient agreed with the plan and demonstrated an understanding of the instructions. I provided over 30 minutes of exam, counseling, chart  review and  complex critical decision making.  Kirtland Bouchard, MD

## 2018-11-27 NOTE — Patient Instructions (Signed)

## 2018-11-28 ENCOUNTER — Other Ambulatory Visit: Payer: Self-pay

## 2018-11-28 ENCOUNTER — Ambulatory Visit (INDEPENDENT_AMBULATORY_CARE_PROVIDER_SITE_OTHER): Payer: Medicare Other | Admitting: Internal Medicine

## 2018-11-28 ENCOUNTER — Encounter: Payer: Self-pay | Admitting: Internal Medicine

## 2018-11-28 VITALS — BP 116/78 | HR 64 | Temp 97.2°F | Resp 16 | Ht 66.75 in | Wt 151.6 lb

## 2018-11-28 DIAGNOSIS — Z79899 Other long term (current) drug therapy: Secondary | ICD-10-CM | POA: Diagnosis not present

## 2018-11-28 DIAGNOSIS — R0989 Other specified symptoms and signs involving the circulatory and respiratory systems: Secondary | ICD-10-CM | POA: Diagnosis not present

## 2018-11-28 DIAGNOSIS — R7309 Other abnormal glucose: Secondary | ICD-10-CM | POA: Diagnosis not present

## 2018-11-28 DIAGNOSIS — E559 Vitamin D deficiency, unspecified: Secondary | ICD-10-CM | POA: Diagnosis not present

## 2018-11-28 DIAGNOSIS — G40219 Localization-related (focal) (partial) symptomatic epilepsy and epileptic syndromes with complex partial seizures, intractable, without status epilepticus: Secondary | ICD-10-CM | POA: Diagnosis not present

## 2018-11-28 DIAGNOSIS — N183 Chronic kidney disease, stage 3 unspecified: Secondary | ICD-10-CM

## 2018-11-28 DIAGNOSIS — E782 Mixed hyperlipidemia: Secondary | ICD-10-CM | POA: Diagnosis not present

## 2018-12-01 LAB — COMPLETE METABOLIC PANEL WITH GFR
AG Ratio: 1.5 (calc) (ref 1.0–2.5)
ALT: 16 U/L (ref 6–29)
AST: 20 U/L (ref 10–35)
Albumin: 4.4 g/dL (ref 3.6–5.1)
Alkaline phosphatase (APISO): 101 U/L (ref 37–153)
BUN/Creatinine Ratio: 26 (calc) — ABNORMAL HIGH (ref 6–22)
BUN: 31 mg/dL — ABNORMAL HIGH (ref 7–25)
CO2: 24 mmol/L (ref 20–32)
Calcium: 10.4 mg/dL (ref 8.6–10.4)
Chloride: 107 mmol/L (ref 98–110)
Creat: 1.21 mg/dL — ABNORMAL HIGH (ref 0.50–0.99)
GFR, Est African American: 56 mL/min/{1.73_m2} — ABNORMAL LOW (ref >=60)
GFR, Est Non African American: 49 mL/min/{1.73_m2} — ABNORMAL LOW (ref >=60)
Globulin: 2.9 g/dL (calc) (ref 1.9–3.7)
Glucose, Bld: 90 mg/dL (ref 65–99)
Potassium: 3.8 mmol/L (ref 3.5–5.3)
Sodium: 140 mmol/L (ref 135–146)
Total Bilirubin: 0.3 mg/dL (ref 0.2–1.2)
Total Protein: 7.3 g/dL (ref 6.1–8.1)

## 2018-12-01 LAB — CBC WITH DIFFERENTIAL/PLATELET
Absolute Monocytes: 456 cells/uL (ref 200–950)
Basophils Absolute: 29 cells/uL (ref 0–200)
Basophils Relative: 0.5 %
Eosinophils Absolute: 80 cells/uL (ref 15–500)
Eosinophils Relative: 1.4 %
HCT: 38.3 % (ref 35.0–45.0)
Hemoglobin: 12.9 g/dL (ref 11.7–15.5)
Lymphs Abs: 1716 cells/uL (ref 850–3900)
MCH: 31.4 pg (ref 27.0–33.0)
MCHC: 33.7 g/dL (ref 32.0–36.0)
MCV: 93.2 fL (ref 80.0–100.0)
MPV: 10.9 fL (ref 7.5–12.5)
Monocytes Relative: 8 %
Neutro Abs: 3420 cells/uL (ref 1500–7800)
Neutrophils Relative %: 60 %
Platelets: 234 10*3/uL (ref 140–400)
RBC: 4.11 10*6/uL (ref 3.80–5.10)
RDW: 12.5 % (ref 11.0–15.0)
Total Lymphocyte: 30.1 %
WBC: 5.7 10*3/uL (ref 3.8–10.8)

## 2018-12-01 LAB — LIPID PANEL
Cholesterol: 196 mg/dL (ref <200)
HDL: 38 mg/dL — ABNORMAL LOW (ref >=50)
LDL Cholesterol (Calc): 128 mg/dL (calc) — ABNORMAL HIGH
Non-HDL Cholesterol (Calc): 158 mg/dL (calc) — ABNORMAL HIGH (ref <130)
Total CHOL/HDL Ratio: 5.2 (calc) — ABNORMAL HIGH (ref <5.0)
Triglycerides: 179 mg/dL — ABNORMAL HIGH (ref <150)

## 2018-12-01 LAB — TSH: TSH: 1.82 mIU/L (ref 0.40–4.50)

## 2018-12-01 LAB — FRUCTOSAMINE: Fructosamine: 259 umol/L (ref 205–285)

## 2018-12-01 LAB — VITAMIN D 25 HYDROXY (VIT D DEFICIENCY, FRACTURES): Vit D, 25-Hydroxy: 94 ng/mL (ref 30–100)

## 2018-12-01 LAB — MAGNESIUM: Magnesium: 1.9 mg/dL (ref 1.5–2.5)

## 2018-12-04 DIAGNOSIS — H2513 Age-related nuclear cataract, bilateral: Secondary | ICD-10-CM | POA: Diagnosis not present

## 2018-12-11 ENCOUNTER — Telehealth: Payer: Self-pay

## 2018-12-11 NOTE — Telephone Encounter (Signed)
Patient advised.

## 2018-12-11 NOTE — Telephone Encounter (Signed)
Would like to stop taking her anti-depressant. Would like to know how to wean herself off of it.

## 2019-01-30 ENCOUNTER — Encounter: Payer: Self-pay | Admitting: Gynecology

## 2019-02-08 ENCOUNTER — Ambulatory Visit (INDEPENDENT_AMBULATORY_CARE_PROVIDER_SITE_OTHER): Payer: Medicare Other | Admitting: Adult Health

## 2019-02-08 ENCOUNTER — Encounter: Payer: Self-pay | Admitting: Adult Health

## 2019-02-08 ENCOUNTER — Other Ambulatory Visit: Payer: Self-pay

## 2019-02-08 VITALS — BP 136/80 | HR 73 | Temp 97.3°F | Wt 154.0 lb

## 2019-02-08 DIAGNOSIS — R350 Frequency of micturition: Secondary | ICD-10-CM

## 2019-02-08 DIAGNOSIS — N390 Urinary tract infection, site not specified: Secondary | ICD-10-CM

## 2019-02-08 MED ORDER — SULFAMETHOXAZOLE-TRIMETHOPRIM 800-160 MG PO TABS: 1.0000 | ORAL_TABLET | Freq: Two times a day (BID) | ORAL | 0 refills | Status: DC

## 2019-02-08 NOTE — Progress Notes (Addendum)
Assessment and Plan:  Tonya Thompson was seen today for urinary frequency.  Diagnoses and all orders for this visit:  Urinary frequency/ presumptive urinary tract infection without hematuria, site unspecified UTI versus OAB versus vaginal dryness - will check UA, C&S. Anticipate may be negative r/t pyridium use; cautioned in future to present to provide specimen prior to initiating pyridium; advised to stop after 24 hours on abx to evaluate progress - Will start ABX now due to pain and fever - push fluids - ER precautions given should symptoms worsen, fever poorly controlled by tylenol with chills/rigors, hematuria or notably worse syptoms -     sulfamethoxazole-trimethoprim (BACTRIM DS) 800-160 MG tablet; Take 1 tablet by mouth 2 (two) times daily. -     Urinalysis w microscopic + reflex cultur  Further disposition pending results of labs. Discussed med's effects and SE's.   Over 15 minutes of exam, counseling, chart review, and critical decision making was performed.   Future Appointments  Date Time Provider [place]  03/01/2019 10:45 AM Liane Comber, NP [facility] None  06/01/2019 10:45 AM Liane Comber, NP [facility] None  08/28/2019 10:00 AM Liane Comber, NP [facility] None    ------------------------------------------------------------------------------------------------------------------   HPI BP 136/80   Pulse 73   Temp (!) 97.3 F (36.3 C)   Wt 154 lb (69.9 kg)   SpO2 98%   BMI 24.30 kg/m   63 y.o.female presents for evaluation of urinary frequency x 1 week; she reports has been feeling "run down" and "fatigued"; she also endorses urinary frequency, has been getting up every 2 hours at night and "urinating a lot." She denies having any dysuria. She denies urinary character change, foul odor. She does endorse a single event of "white clump" in urine. She started taking pyridium 4 days ago which has helped symptoms somewhat but has not resolved symptoms. She has had  a mild fever at home since about 10/4 ranging 99.1-100 but well controlled by tylenol. She has been taking pyridium x 4 days from previous UTI which has improved symptoms but not resolved. Last was estimated 1 year ago. No recent abx use.   She has never been sexually active. Difficult pelvic exam by GYN last year and recommended none further. Denies vaginal discharge. Denies hematuria. Denies n/v/d/c. She has back pain chronically; denies notable change in the last week.    Past Medical History:  Diagnosis Date  . AVM (arteriovenous malformation) brain   . 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 Stroke [facility])      Allergies  Allergen Reactions  . Aspirin Other (See Comments)    CONTRAINDICATED DUE TO HISTORY OF BLEEDING IN BRAIN    Current Outpatient Medications on File Prior to Visit  Medication Sig  . baclofen (LIORESAL) 10 MG tablet Take 10 mg by mouth.  . butalbital-acetaminophen-caffeine (FIORICET, ESGIC) 50-325-40 MG tablet Take by mouth 2 (two) times daily as needed for headache.  . Calcium Carbonate Antacid (TUMS PO) Take by mouth.  . Calcium Carbonate-Vitamin D 600-400 MG-UNIT tablet Take by mouth.  . Cholecalciferol (VITAMIN D3) 2000 units capsule Take by mouth 2 (two) times daily.   . fexofenadine (ALLEGRA) 180 MG tablet Take 180 mg by mouth.  . Multiple Vitamins-Minerals (MULTIVITAMIN WITH MINERALS) tablet Take by mouth.  . Omega-3 Fatty Acids (RA FISH OIL) 1000 MG CAPS Take by mouth.  . Potassium 99 MG TABS Take by mouth.  . topiramate (TOPAMAX) 200 MG tablet Takes 1 &1/2 tabs  2 x /day    (3 tabs /day)  . escitalopram (LEXAPRO) 10 MG tablet Take 1 tablet (10 mg total) by mouth daily. (Patient not taking: Reported on 02/08/2019)   No current facility-administered medications on file prior to visit.     ROS: all negative except above.   Physical Exam:  BP 136/80   Pulse 73   Temp (!) 97.3 F (36.3 C)   Wt 154 lb (69.9 kg)   SpO2 98%    BMI 24.30 kg/m   General appearance: alert, no distress, WD/WN, female HEENT: normocephalic, sclerae anicteric Neck: supple Heart: RRR, normal S1, S2, no murmurs Lungs: CTA bilaterally, no wheezes, rhonchi, or rales Abdomen: +bs, soft, mild suprapubic tenderness without palpable bladder, non distended, no palpable masses, no hepatomegaly, no splenomegaly. No CVA tenderness.  Musculoskeletal: nontender, no swelling; she has chronic contracture of R hand  Extremities: no edema, no cyanosis, no clubbing Pulses: 2+ symmetric, upper and lower extremities, normal cap refill Neurological: alert, oriented x 3, gait abnormal consistent with baseline, patient walks with cane Psychiatric: flat affect, behavior normal, pleasant     Izora Ribas, NP 4:35 PM [facility]

## 2019-02-08 NOTE — Addendum Note (Signed)
Addended by: Izora Ribas on: 02/08/2019 05:19 PM   Modules accepted: Orders

## 2019-02-08 NOTE — Patient Instructions (Signed)
Sulfamethoxazole; Trimethoprim, SMX-TMP tablets What is this medicine? SULFAMETHOXAZOLE; TRIMETHOPRIM or SMX-TMP (suhl fuh meth OK suh zohl; trye METH oh prim) is a combination of a sulfonamide antibiotic and a second antibiotic, trimethoprim. It is used to treat or prevent certain kinds of bacterial infections. It will not work for colds, flu, or other viral infections. This medicine may be used for other purposes; ask your health care provider or pharmacist if you have questions. COMMON BRAND NAME(S): Bacter-Aid DS, Bactrim, Bactrim DS, Septra, Septra DS What should I tell my health care provider before I take this medicine? They need to know if you have any of these conditions:  anemia  asthma  being treated with anticonvulsants  if you frequently drink alcohol containing drinks  kidney disease  liver disease  low level of folic acid or [id number] dehydrogenase  poor nutrition or malabsorption  porphyria  severe allergies  thyroid disorder  an unusual or allergic reaction to sulfamethoxazole, trimethoprim, sulfa drugs, other medicines, foods, dyes, or preservatives  pregnant or trying to get pregnant  breast-feeding How should I use this medicine? Take this medicine by mouth with a full glass of water. Follow the directions on the prescription label. Take your medicine at regular intervals. Do not take it more often than directed. Do not skip doses or stop your medicine early. Talk to your pediatrician regarding the use of this medicine in children. Special care may be needed. This medicine has been used in children as young as 20 months of age. Overdosage: If you think you have taken too much of this medicine contact a poison control center or emergency room at once. NOTE: This medicine is only for you. Do not share this medicine with others. What if I miss a dose? If you miss a dose, take it as soon as you can. If it is almost time for your next dose, take only  that dose. Do not take double or extra doses. What may interact with this medicine? Do not take this medicine with any of the following medications:  aminobenzoate potassium  dofetilide  metronidazole This medicine may also interact with the following medications:  ACE inhibitors like benazepril, enalapril, lisinopril, and ramipril  birth control pills  cyclosporine  digoxin  diuretics  indomethacin  medicines for diabetes  methenamine  methotrexate  phenytoin  potassium supplements  pyrimethamine  sulfinpyrazone  tricyclic antidepressants  warfarin This list may not describe all possible interactions. Give your health care provider a list of all the medicines, herbs, non-prescription drugs, or dietary supplements you use. Also tell them if you smoke, drink alcohol, or use illegal drugs. Some items may interact with your medicine. What should I watch for while using this medicine? Tell your doctor or health care professional if your symptoms do not improve. Drink several glasses of water a day to reduce the risk of kidney problems. Do not treat diarrhea with over the counter products. Contact your doctor if you have diarrhea that lasts more than 2 days or if it is severe and watery. This medicine can make you more sensitive to the sun. Keep out of the sun. If you cannot avoid being in the sun, wear protective clothing and use a sunscreen. Do not use sun lamps or tanning beds/booths. What side effects may I notice from receiving this medicine? Side effects that you should report to your doctor or health care professional as soon as possible:  allergic reactions like skin rash or hives, swelling of the face, lips,  or tongue  breathing problems  fever or chills, sore throat  irregular heartbeat, chest pain  joint or muscle pain  pain or difficulty passing urine  red pinpoint spots on skin  redness, blistering, peeling or loosening of the skin, including  inside the mouth  unusual bleeding or bruising  unusually weak or tired  yellowing of the eyes or skin Side effects that usually do not require medical attention (report to your doctor or health care professional if they continue or are bothersome):  diarrhea  dizziness  headache  loss of appetite  nausea, vomiting  nervousness This list may not describe all possible side effects. Call your doctor for medical advice about side effects. You may report side effects to FDA at 1-800-FDA-1088. Where should I keep my medicine? Keep out of the reach of children. Store at room temperature between 20 to 25 degrees C (68 to 77 degrees F). Protect from light. Throw away any unused medicine after the expiration date. NOTE: This sheet is a summary. It may not cover all possible information. If you have questions about this medicine, talk to your doctor, pharmacist, or health care provider.  2020 Elsevier/Gold Standard (2012-11-24 14:38:26)      Mirabegron extended-release tablets What is this medicine? MIRABEGRON (MIR a BEG ron) is used to treat overactive bladder. This medicine reduces the amount of bathroom visits. It may also help to control wetting accidents. It may be used alone, but sometimes may be given with other treatments. This medicine may be used for other purposes; ask your health care provider or pharmacist if you have questions. COMMON BRAND NAME(S): Myrbetriq What should I tell my health care provider before I take this medicine? They need to know if you have any of these conditions:  high blood pressure  kidney disease  liver disease  problems urinating  prostate disease  an unusual or allergic reaction to mirabegron, other medicines, foods, dyes, or preservatives  pregnant or trying to get pregnant  breast-feeding How should I use this medicine? Take this medicine by mouth with a glass of water. Follow the directions on the prescription label. Do not cut,  crush or chew this medicine. You can take it with or without food. If it upsets your stomach, take it with food. Take your medicine at regular intervals. Do not take it more often than directed. Do not stop taking except on your doctor's advice. Talk to your pediatrician regarding the use of this medicine in children. Special care may be needed. Overdosage: If you think you have taken too much of this medicine contact a poison control center or emergency room at once. NOTE: This medicine is only for you. Do not share this medicine with others. What if I miss a dose? If you miss a dose, take it as soon as you can. If it is almost time for your next dose, take only that dose. Do not take double or extra doses. What may interact with this medicine?  codeine  desipramine  digoxin  flecainide  MAOIs like Carbex, Eldepryl, Marplan, Nardil, and Parnate  methadone  metoprolol  pimozide  propafenone  thioridazine  warfarin This list may not describe all possible interactions. Give your health care provider a list of all the medicines, herbs, non-prescription drugs, or dietary supplements you use. Also tell them if you smoke, drink alcohol, or use illegal drugs. Some items may interact with your medicine. What should I watch for while using this medicine? Visit your doctor or health  care professional for regular checks on your progress. Check your blood pressure as directed. Ask your doctor or health care professional what your blood pressure should be and when you should contact him or her. You may need to limit your intake of tea, coffee, caffeinated sodas, or alcohol. These drinks may make your symptoms worse. What side effects may I notice from receiving this medicine? Side effects that you should report to your doctor or health care professional as soon as possible:  allergic reactions like skin rash, itching or hives, swelling of the face, lips, or tongue  high blood  pressure  fast, irregular heartbeat  redness, blistering, peeling or loosening of the skin, including inside the mouth  signs of infection like fever or chills; pain or difficulty passing urine  trouble passing urine or change in the amount of urine Side effects that usually do not require medical attention (report to your doctor or health care professional if they continue or are bothersome):  constipation  dry mouth  headache  runny nose  stomach upset This list may not describe all possible side effects. Call your doctor for medical advice about side effects. You may report side effects to FDA at 1-800-FDA-1088. Where should I keep my medicine? Keep out of the reach of children. Store at room temperature between 15 and 30 degrees C (59 and 86 degrees F). Throw away any unused medicine after the expiration date. NOTE: This sheet is a summary. It may not cover all possible information. If you have questions about this medicine, talk to your doctor, pharmacist, or health care provider.  2020 Elsevier/Gold Standard (2016-09-09 11:33:21)

## 2019-02-10 LAB — URINALYSIS W MICROSCOPIC + REFLEX CULTURE
Bacteria, UA: NONE SEEN /HPF
Bilirubin Urine: NEGATIVE
Glucose, UA: NEGATIVE
Hgb urine dipstick: NEGATIVE
Hyaline Cast: NONE SEEN /LPF
Ketones, ur: NEGATIVE
Nitrites, Initial: POSITIVE — AB
Protein, ur: NEGATIVE
Specific Gravity, Urine: 1.006 (ref 1.001–1.03)
Squamous Epithelial / HPF: NONE SEEN /HPF (ref <=5)
WBC, UA: NONE SEEN /HPF (ref 0–5)
pH: 7 (ref 5.0–8.0)

## 2019-02-10 LAB — CULTURE INDICATED

## 2019-02-10 LAB — URINE CULTURE
MICRO NUMBER:: 973502
SPECIMEN QUALITY:: ADEQUATE

## 2019-02-28 NOTE — Progress Notes (Signed)
FOLLOW UP  Assessment and Plan:   Labile hypertension Monitor blood pressure at home; call if consistently over 130/80 Continue DASH diet.   Reminder to go to the ER if any CP, SOB, nausea, dizziness, severe HA, changes vision/speech, left arm numbness and tingling and jaw pain.  Partial symptomatic epilepsy with complex partial seizures, intractable, without status epilepticus ([place]) Followed by Dr. Marlou Sa, last seizure remote Continue topamax   Vitamin D deficiency At goal at recent check; continue to recommend supplementation for goal of 60-100 Defer vitamin D level  Hyperlipidemia, mixed Will plan to initiate medication if LDL 130+ Continue low cholesterol diet and exercise.  Check lipid panel.  -     Lipid panel -     TSH  Abnormal glucose/insulin resistance Recent A1Cs at goal Discussed diet/exercise, weight management  Defer A1C; check CMP -     CMP/GFR  BMI 23 Continue to recommend diet heavy in fruits and veggies and low in animal meats, cheeses, and dairy products, appropriate calorie intake Discuss exercise recommendations routinely Continue to monitor weight at each visit  Medication management -     CBC with Differential/Platelet -     COMPLETE METABOLIC PANEL WITH GFR -     Magnesium  Right hemiparesis (HCC) Continue with cane, PT as needed, doing exercises at home Denies recent falls  CKD (chronic kidney disease) stage 3, GFR 30-59 ml/min (HCC) Increase fluids, avoid NSAIDS, monitor sugars, will monitor   Continue diet and meds as discussed. Further disposition pending results of labs. Discussed med's effects and SE's.   Over 30 minutes of exam, counseling, chart review, and critical decision making was performed.   Future Appointments  Date Time Provider [place]  06/01/2019 10:45 AM Vicie Mutters, PA-C [facility] None  08/28/2019 10:00 AM Liane Comber, NP [facility] None     ----------------------------------------------------------------------------------------------------------------------  HPI 63 y.o. female  presents for 3 month follow up on hypertension, cholesterol, glucose management, weight and vitamin D deficiency.   Follows with Dr[facility] Duwayne [facility] ([facility] [place]) for history of seizure disorder predating to age 43 yo and Brain AVM at age 57 yo with a 1st stroke at age 59 (42)  And 3 more strokes by age 43 yo (46) when she had he 1st surg for AVM. Patient has residual R hemiparesis. Her last seizure reportedly was circa 1988, ? Associated with fever. She is on topamax.     She moved into an apartment last year after living with her uncle for several months; she is very happy with this change and reports she is doing well. She was placed on lexapro due to depression and agitation r/t problems while living with family, but is now off and doing well.    BMI is Body mass index is 23.67 kg/m., she has been working on diet and exercise, walks with cane 3 days a week around her living compound 3 days a week and does daily exercises she learned with PT.  Wt Readings from Last 3 Encounters:  03/01/19 150 lb (68 kg)  02/08/19 154 lb (69.9 kg)  11/28/18 151 lb 9.6 oz (68.8 kg)   She does not check BPs at home, today their BP is BP: 130/80  She does workout. She denies chest pain, shortness of breath, dizziness.   She is not on cholesterol medication omega 3 and denies myalgias. Her cholesterol is not at goal of LDL <100. The cholesterol last visit was:   Lab Results  Component Value  Date   CHOL 196 11/28/2018   HDL 38 (L) 11/28/2018   LDLCALC 128 (H) 11/28/2018   TRIG 179 (H) 11/28/2018   CHOLHDL 5.2 (H) 11/28/2018    She has been working on diet and exercise for glucose management/insulin resistance, and denies increased appetite, nausea, paresthesia of the feet, polydipsia, polyuria and visual disturbances. Last A1C in the office  was:  Lab Results  Component Value Date   HGBA1C 5.1 08/22/2018    She has stable CKD IIIa monitored at this office:  Lab Results  Component Value Date   GFRNONAA 49 (L) 11/28/2018   Patient is on Vitamin D supplement.   Lab Results  Component Value Date   VD25OH 94 11/28/2018        Current Medications:  Current Outpatient Medications on File Prior to Visit  Medication Sig  . baclofen (LIORESAL) 10 MG tablet Take 10 mg by mouth.  . butalbital-acetaminophen-caffeine (FIORICET, ESGIC) 50-325-40 MG tablet Take by mouth 2 (two) times daily as needed for headache.  . Calcium Carbonate Antacid (TUMS PO) Take by mouth.  . Calcium Carbonate-Vitamin D 600-400 MG-UNIT tablet Take by mouth.  . Cholecalciferol (VITAMIN D3) 2000 units capsule Take by mouth 2 (two) times daily.   . fexofenadine (ALLEGRA) 180 MG tablet Take 180 mg by mouth.  . Multiple Vitamins-Minerals (MULTIVITAMIN WITH MINERALS) tablet Take by mouth.  . Omega-3 Fatty Acids (RA FISH OIL) 1000 MG CAPS Take by mouth.  . Potassium 99 MG TABS Take by mouth.  . topiramate (TOPAMAX) 200 MG tablet Takes 1 &1/2 tabs 2 x /day    (3 tabs /day)   No current facility-administered medications on file prior to visit.      Allergies:  Allergies  Allergen Reactions  . Aspirin Other (See Comments)    CONTRAINDICATED DUE TO HISTORY OF BLEEDING IN BRAIN     Medical History:  Past Medical History:  Diagnosis Date  . AVM (arteriovenous malformation) brain   . 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 Stroke [facility])    Family history- Reviewed and unchanged Social history- Reviewed and unchanged   Review of Systems:  Review of Systems  Constitutional: Negative for malaise/fatigue and weight loss.  HENT: Negative for hearing loss and tinnitus.   Eyes: Negative for blurred vision and double vision.  Respiratory: Negative for cough, shortness of breath and wheezing.   Cardiovascular: Negative for chest  pain, palpitations, orthopnea, claudication and leg swelling.  Gastrointestinal: Negative for abdominal pain, blood in stool, constipation, diarrhea, heartburn, melena, nausea and vomiting.  Genitourinary: Negative.   Musculoskeletal: Negative for joint pain and myalgias.  Skin: Negative for rash.  Neurological: Negative for dizziness, tingling, sensory change, weakness and headaches.  Endo/Heme/Allergies: Negative for polydipsia.  Psychiatric/Behavioral: Negative.   All other systems reviewed and are negative.     Physical Exam: BP 130/80   Pulse 80   Temp 98.1 F (36.7 C)   Ht 5' 6.75" (1.695 m)   Wt 150 lb (68 kg)   SpO2 97%   BMI 23.67 kg/m  Wt Readings from Last 3 Encounters:  03/01/19 150 lb (68 kg)  02/08/19 154 lb (69.9 kg)  11/28/18 151 lb 9.6 oz (68.8 kg)   General appearance: alert, no distress, WD/WN, female HEENT: normocephalic, sclerae anicteric, TMs pearly, nares patent, no discharge or erythema, pharynx normal Oral cavity: MMM, no lesions Neck: supple, no lymphadenopathy, no thyromegaly, no masses Heart: RRR, normal S1, S2, no  murmurs Lungs: CTA bilaterally, no wheezes, rhonchi, or rales Abdomen: +bs, soft, non tender, non distended, no masses, no hepatomegaly, no splenomegaly Musculoskeletal: nontender, no swelling, no obvious deformity Extremities: no edema, no cyanosis, no clubbing Pulses: 2+ symmetric, upper and lower extremities, normal cap refill Neurological: alert, oriented x 3, CN2-12 intact, strength normal upper extremities and lower extremities left side, right side with decreased strength and spastic right hip and right hand with right ankle in brace, no cerebellar signs, gait abnormal, patient walks with cane Psychiatric: flat affect, behavior normal, pleasant    Izora Ribas, NP 11:34 AM [facility]

## 2019-03-01 ENCOUNTER — Encounter: Payer: Self-pay | Admitting: Adult Health

## 2019-03-01 ENCOUNTER — Other Ambulatory Visit: Payer: Self-pay

## 2019-03-01 ENCOUNTER — Ambulatory Visit (INDEPENDENT_AMBULATORY_CARE_PROVIDER_SITE_OTHER): Payer: Medicare Other | Admitting: Adult Health

## 2019-03-01 ENCOUNTER — Ambulatory Visit: Payer: Medicare Other | Admitting: Adult Health

## 2019-03-01 VITALS — BP 130/80 | HR 80 | Temp 98.1°F | Ht 66.75 in | Wt 150.0 lb

## 2019-03-01 DIAGNOSIS — E782 Mixed hyperlipidemia: Secondary | ICD-10-CM

## 2019-03-01 DIAGNOSIS — E8881 Metabolic syndrome: Secondary | ICD-10-CM

## 2019-03-01 DIAGNOSIS — R0989 Other specified symptoms and signs involving the circulatory and respiratory systems: Secondary | ICD-10-CM

## 2019-03-01 DIAGNOSIS — E559 Vitamin D deficiency, unspecified: Secondary | ICD-10-CM

## 2019-03-01 DIAGNOSIS — Z23 Encounter for immunization: Secondary | ICD-10-CM | POA: Diagnosis not present

## 2019-03-01 DIAGNOSIS — G8191 Hemiplegia, unspecified affecting right dominant side: Secondary | ICD-10-CM | POA: Diagnosis not present

## 2019-03-01 DIAGNOSIS — Z6823 Body mass index (BMI) 23.0-23.9, adult: Secondary | ICD-10-CM | POA: Diagnosis not present

## 2019-03-01 DIAGNOSIS — Z79899 Other long term (current) drug therapy: Secondary | ICD-10-CM | POA: Diagnosis not present

## 2019-03-01 DIAGNOSIS — N1831 Chronic kidney disease, stage 3a: Secondary | ICD-10-CM

## 2019-03-01 DIAGNOSIS — R7309 Other abnormal glucose: Secondary | ICD-10-CM | POA: Diagnosis not present

## 2019-03-01 DIAGNOSIS — G40219 Localization-related (focal) (partial) symptomatic epilepsy and epileptic syndromes with complex partial seizures, intractable, without status epilepticus: Secondary | ICD-10-CM

## 2019-03-01 DIAGNOSIS — D649 Anemia, unspecified: Secondary | ICD-10-CM | POA: Diagnosis not present

## 2019-03-01 NOTE — Patient Instructions (Addendum)
Goals    . Exercise 150 min/wk Moderate Activity    . LDL CALC < 100    . Weight (lb) < 140 lb (63.5 kg)         Preventing High Cholesterol Cholesterol is a white, waxy substance similar to fat that the human body needs to help build cells. The liver makes all the cholesterol that a person's body needs. Having high cholesterol (hypercholesterolemia) increases a person's risk for heart disease and stroke. Extra (excess) cholesterol comes from the food the person eats. High cholesterol can often be prevented with diet and lifestyle changes. If you already have high cholesterol, you can control it with diet and lifestyle changes and with medicine. How can high cholesterol affect me? If you have high cholesterol, deposits (plaques) may build up on the walls of your arteries. The arteries are the blood vessels that carry blood away from your heart. Plaques make the arteries narrower and stiffer. This can limit or block blood flow and cause blood clots to form. Blood clots:  Are tiny balls of cells that form in your blood.  Can move to the heart or brain, causing a heart attack or stroke. Plaques in arteries greatly increase your risk for heart attack and stroke.Making diet and lifestyle changes can reduce your risk for these conditions that may threaten your life. What can increase my risk? This condition is more likely to develop in people who:  Eat foods that are high in saturated fat or cholesterol. Saturated fat is mostly found in: ? Foods that contain animal fat, such as red meat and some dairy products. ? Certain fatty foods made from plants, such as tropical oils.  Are overweight.  Are not getting enough exercise.  Have a family history of high cholesterol. What actions can I take to prevent this? Nutrition   Eat less saturated fat.  Avoid trans fats (partially hydrogenated oils). These are often found in margarine and in some baked goods, fried foods, and snacks bought in  packages.  Avoid precooked or cured meat, such as sausages or meat loaves.  Avoid foods and drinks that have added sugars.  Eat more fruits, vegetables, and whole grains.  Choose healthy sources of protein, such as fish, poultry, lean cuts of red meat, beans, peas, lentils, and nuts.  Choose healthy sources of fat, such as: ? Nuts. ? Vegetable oils, especially olive oil. ? Fish that have healthy fats (omega-3 fatty acids), such as mackerel or salmon. The items listed above may not be a complete list of recommended foods and beverages. Contact a dietitian for more information. Lifestyle  Lose weight if you are overweight. Losing 5-10 lb (2.3-4.5 kg) can help prevent or control high cholesterol. It can also lower your risk for diabetes and high blood pressure. Ask your health care provider to help you with a diet and exercise plan to lose weight safely.  Do not use any products that contain nicotine or tobacco, such as cigarettes, e-cigarettes, and chewing tobacco. If you need help quitting, ask your health care provider.  Limit your alcohol intake. ? Do not drink alcohol if:  Your health care provider tells you not to drink.  You are pregnant, may be pregnant, or are planning to become pregnant. ? If you drink alcohol:  Limit how much you use to:  0-1 drink a day for women.  0-2 drinks a day for men.  Be aware of how much alcohol is in your drink. In the [place] one drink  equals one 12 oz bottle of beer (355 mL), one 5 oz glass of wine (148 mL), or one 1 oz glass of hard liquor (44 mL). Activity   Get enough exercise. Each week, do at least 150 minutes of exercise that takes a medium level of effort (moderate-intensity exercise). ? This is exercise that:  Makes your heart beat faster and makes you breathe harder than usual.  Allows you to still be able to talk. ? You could exercise in short sessions several times a day or longer sessions a few times a week. For example, on  5 days each week, you could walk fast or ride your bike 3 times a day for 10 minutes each time.  Do exercises as told by your health care provider. Medicines  In addition to diet and lifestyle changes, your health care provider may recommend medicines to help lower cholesterol. This may be a medicine to lower the amount of cholesterol your liver makes. You may need medicine if: ? Diet and lifestyle changes do not lower your cholesterol enough. ? You have high cholesterol and other risk factors for heart disease or stroke.  Take over-the-counter and prescription medicines only as told by your health care provider. General information  Manage your risk factors for high cholesterol. Talk with your health care provider about all your risk factors and how to lower your risk.  Manage other conditions that you have, such as diabetes or high blood pressure (hypertension).  Have blood tests to check your cholesterol levels at regular points in time as told by your health care provider.  Keep all follow-up visits as told by your health care provider. This is important. Where to find more information  American Heart Association: www.heart.org  National Heart, Lung, and Blood Institute: https://wilson-eaton.com/ Summary  High cholesterol increases your risk for heart disease and stroke. By keeping your cholesterol level low, you can reduce your risk for these conditions.  High cholesterol can often be prevented with diet and lifestyle changes.  Work with your health care provider to manage your risk factors, and have your blood tested regularly. This information is not intended to replace advice given to you by your health care provider. Make sure you discuss any questions you have with your health care provider. Document Released: 05/04/2015 Document Revised: 08/11/2018 Document Reviewed: 12/27/2015 Elsevier Patient Education  2020 [employer].

## 2019-03-02 ENCOUNTER — Other Ambulatory Visit: Payer: Self-pay | Admitting: Adult Health

## 2019-03-02 DIAGNOSIS — Z79899 Other long term (current) drug therapy: Secondary | ICD-10-CM

## 2019-03-02 DIAGNOSIS — D649 Anemia, unspecified: Secondary | ICD-10-CM

## 2019-03-02 DIAGNOSIS — N289 Disorder of kidney and ureter, unspecified: Secondary | ICD-10-CM

## 2019-03-02 LAB — COMPLETE METABOLIC PANEL WITH GFR
AG Ratio: 1.4 (calc) (ref 1.0–2.5)
ALT: 14 U/L (ref 6–29)
AST: 16 U/L (ref 10–35)
Albumin: 4.2 g/dL (ref 3.6–5.1)
Alkaline phosphatase (APISO): 111 U/L (ref 37–153)
BUN/Creatinine Ratio: 18 (calc) (ref 6–22)
BUN: 37 mg/dL — ABNORMAL HIGH (ref 7–25)
CO2: 26 mmol/L (ref 20–32)
Calcium: 10.4 mg/dL (ref 8.6–10.4)
Chloride: 106 mmol/L (ref 98–110)
Creat: 2.05 mg/dL — ABNORMAL HIGH (ref 0.50–0.99)
GFR, Est African American: 30 mL/min/{1.73_m2} — ABNORMAL LOW (ref >=60)
GFR, Est Non African American: 26 mL/min/{1.73_m2} — ABNORMAL LOW (ref >=60)
Globulin: 2.9 g/dL (calc) (ref 1.9–3.7)
Glucose, Bld: 92 mg/dL (ref 65–99)
Potassium: 4.1 mmol/L (ref 3.5–5.3)
Sodium: 139 mmol/L (ref 135–146)
Total Bilirubin: 0.3 mg/dL (ref 0.2–1.2)
Total Protein: 7.1 g/dL (ref 6.1–8.1)

## 2019-03-02 LAB — CBC WITH DIFFERENTIAL/PLATELET
Absolute Monocytes: 506 cells/uL (ref 200–950)
Basophils Absolute: 28 cells/uL (ref 0–200)
Basophils Relative: 0.5 %
Eosinophils Absolute: 88 cells/uL (ref 15–500)
Eosinophils Relative: 1.6 %
HCT: 34.2 % — ABNORMAL LOW (ref 35.0–45.0)
Hemoglobin: 11.3 g/dL — ABNORMAL LOW (ref 11.7–15.5)
Lymphs Abs: 1606 cells/uL (ref 850–3900)
MCH: 31 pg (ref 27.0–33.0)
MCHC: 33 g/dL (ref 32.0–36.0)
MCV: 94 fL (ref 80.0–100.0)
MPV: 11.3 fL (ref 7.5–12.5)
Monocytes Relative: 9.2 %
Neutro Abs: 3273 cells/uL (ref 1500–7800)
Neutrophils Relative %: 59.5 %
Platelets: 245 10*3/uL (ref 140–400)
RBC: 3.64 10*6/uL — ABNORMAL LOW (ref 3.80–5.10)
RDW: 11.6 % (ref 11.0–15.0)
Total Lymphocyte: 29.2 %
WBC: 5.5 10*3/uL (ref 3.8–10.8)

## 2019-03-02 LAB — TEST AUTHORIZATION

## 2019-03-02 LAB — LIPID PANEL
Cholesterol: 177 mg/dL (ref <200)
HDL: 37 mg/dL — ABNORMAL LOW (ref >=50)
LDL Cholesterol (Calc): 106 mg/dL (calc) — ABNORMAL HIGH
Non-HDL Cholesterol (Calc): 140 mg/dL (calc) — ABNORMAL HIGH (ref <130)
Total CHOL/HDL Ratio: 4.8 (calc) (ref <5.0)
Triglycerides: 227 mg/dL — ABNORMAL HIGH (ref <150)

## 2019-03-02 LAB — IRON,TIBC AND FERRITIN PANEL
%SAT: 29 % (calc) (ref 16–45)
Ferritin: 192 ng/mL (ref 16–288)
Iron: 78 ug/dL (ref 45–160)
TIBC: 267 mcg/dL (calc) (ref 250–450)

## 2019-03-02 LAB — RETICULOCYTES
ABS Retic: 33120 cells/uL (ref 20000–8000)
Retic Ct Pct: 0.9 %

## 2019-03-02 LAB — TSH: TSH: 2.8 mIU/L (ref 0.40–4.50)

## 2019-03-15 NOTE — Progress Notes (Signed)
Assessment and Plan:  Tonya Thompson was seen today for follow-up.  Diagnoses and all orders for this visit:  Anemia, unspecified type Recheck CBC, ? Had mild hemorroid bleed which has resolved with prep H Hemoccult and rectal exam in office is normal  If CBCs persistently trending down will plan to refer back to GI due to hx of polyps -     CBC with Diff -     POC Hemoccult Bld/Stl (1-Cd Office Dx)  Deterioration in renal function/ Stage 3 chronic kidney disease, unspecified whether stage 3a or 3b CKD She has been pushing water intake per recommendation - 16.9 fluid ounces x 4 daily  Med list reviewed without high risk meds; denies NSAIDs Denies any remarkable sx; normal urine character and output corresponding with increased intake If persistent and unexplained will refer urgently to nephrology  -     BMP with Estimated GFR ([id number]) -     Magnesium -     Urinalysis w microscopic + reflex cultur  Further disposition pending results of labs. Discussed med's effects and SE's.   Over 30 minutes of exam, counseling, chart review, and critical decision making was performed.   Future Appointments  Date Time Provider Tonya Thompson  06/01/2019 10:45 AM Tonya Mutters, PA-C [facility] None  08/28/2019 10:00 AM Tonya Comber, NP [facility] None    ------------------------------------------------------------------------------------------------------------------   HPI BP 122/74   Pulse 76   Temp (!) 97.4 F (36.3 C)   Resp 16   Ht 5' 6.75" (1.695 m)   Wt 147 lb 9.6 oz (67 kg)   BMI 23.29 kg/m   63 y.o.female presents for close follow up due to new anemia and deterioration of renal functions noted at recent routine OV.   New mild anemia was noted at recent routine OV; iron panel with ferritin was normal, with appropriate reticulocyte response. She had b12 level last year that was excellent without supplement.  She was given hemoccult card x 3 to complete and presents for recheck  CBC. She reports she did complete and put in mail 4 days ago. She does endorse on 10/18 she had some rectal bleeding, thought she had a hemorroid flare, used preparation H and has seen no more bleeding.   Will order folate as this has not been checked.  She had colonoscopy in 07/2015 with polyps and was recommended 5 year follow up. She has small grade 1 internal hemorrhoid at that time.  Lab Results  Component Value Date   WBC 5.5 03/01/2019   HGB 11.3 (L) 03/01/2019   HCT 34.2 (L) 03/01/2019   MCV 94.0 03/01/2019   PLT 245 03/01/2019   Lab Results  Component Value Date   IRON 78 03/01/2019   TIBC 267 03/01/2019   FERRITIN 192 03/01/2019   Lab Results  Component Value Date   RETICCTPCT 0.9 03/01/2019   Lab Results  Component Value Date   [id number] 389 11/10/2017   No results found for: FOLATE    There has been unexplained notable deterioration in renal functions, superimposed on stage III CKD. On review there have not been new medications, she is not on any nephrotoxic medications. She does admit to poor oral fluid intake historically. She denies NSAID use.  She has been advised to push fluid intake, keep a log and recheck today. She states she has been drinking 4 bottles of 16.9 fluid ounces daily. She denies any notable symptoms. States urine output is increased with intake, normal character. Denies back pain, fatigue, dizziness,  mental fog.  Lab Results  Component Value Date   GFRNONAA 26 (L) 03/01/2019   GFRNONAA 49 (L) 11/28/2018   GFRNONAA 56 (L) 08/22/2018   Lab Results  Component Value Date   CREATININE 2.05 (H) 03/01/2019   CREATININE 1.21 (H) 11/28/2018   CREATININE 1.07 (H) 08/22/2018      Past Medical History:  Diagnosis Date  . AVM (arteriovenous malformation) brain   . Seizures ([place])    [place] & Partial complex seizure disorder  . Stroke [facility])      Allergies  Allergen Reactions  . Aspirin Other (See Comments)    CONTRAINDICATED DUE TO HISTORY  OF BLEEDING IN BRAIN    Current Outpatient Medications on File Prior to Visit  Medication Sig  . baclofen (LIORESAL) 10 MG tablet Take 10 mg by mouth.  . butalbital-acetaminophen-caffeine (FIORICET, ESGIC) 50-325-40 MG tablet Take by mouth 2 (two) times daily as needed for headache.  . Calcium Carbonate Antacid (TUMS PO) Take by mouth.  . Calcium Carbonate-Vitamin D 600-400 MG-UNIT tablet Take by mouth.  . Cholecalciferol (VITAMIN D3) 2000 units capsule Take by mouth 2 (two) times daily.   . fexofenadine (ALLEGRA) 180 MG tablet Take 180 mg by mouth.  . Multiple Vitamins-Minerals (MULTIVITAMIN WITH MINERALS) tablet Take by mouth.  . Omega-3 Fatty Acids (RA FISH OIL) 1000 MG CAPS Take by mouth.  . Potassium 99 MG TABS Take by mouth.  . topiramate (TOPAMAX) 200 MG tablet Takes 1 &1/2 tabs 2 x /day    (3 tabs /day)   No current facility-administered medications on file prior to visit.     ROS: all negative except above.   Physical Exam:  BP 122/74   Pulse 76   Temp (!) 97.4 F (36.3 C)   Resp 16   Ht 5' 6.75" (1.695 m)   Wt 147 lb 9.6 oz (67 kg)   BMI 23.29 kg/m   General appearance: alert, no distress, WD/WN, female HEENT: normocephalic, sclerae anicteric, TMs pearly, nares patent, no discharge or erythema, pharynx normal Oral cavity: MMM, no lesions Neck: supple, no lymphadenopathy, no thyromegaly, no masses Heart: RRR, normal S1, S2, no murmurs Lungs: CTA bilaterally, no wheezes, rhonchi, or rales Abdomen: +bs, soft, non tender, non distended, no masses, no hepatomegaly, no splenomegaly Musculoskeletal: nontender, no swelling, no obvious deformity Extremities: no edema, no cyanosis, no clubbing Pulses: 2+ symmetric, upper and lower extremities, normal cap refill Neurological: alert, oriented x 3, CN2-12 intact, strength normal upper extremities and lower extremities left side, right side with decreased strength and spastic right hip and right hand with right ankle in brace,  no cerebellar signs, gait abnormal, patient walks with cane Rectal exam: negative without palpable mass, lesions or tenderness, normal tone, no visible or obviously palpable hemorroids, stool guaiac negative, firm stool in rectum. Exam chaperoned by Melbourne Abts, RN.  Psychiatric: flat affect, behavior normal, pleasant     Izora Ribas, NP 9:16 AM [facility]

## 2019-03-16 ENCOUNTER — Other Ambulatory Visit: Payer: Self-pay

## 2019-03-16 ENCOUNTER — Ambulatory Visit (INDEPENDENT_AMBULATORY_CARE_PROVIDER_SITE_OTHER): Payer: Medicare Other | Admitting: Adult Health

## 2019-03-16 ENCOUNTER — Encounter: Payer: Self-pay | Admitting: Adult Health

## 2019-03-16 VITALS — BP 122/74 | HR 76 | Temp 97.4°F | Resp 16 | Ht 66.75 in | Wt 147.6 lb

## 2019-03-16 DIAGNOSIS — D649 Anemia, unspecified: Secondary | ICD-10-CM

## 2019-03-16 DIAGNOSIS — N289 Disorder of kidney and ureter, unspecified: Secondary | ICD-10-CM

## 2019-03-16 DIAGNOSIS — R0989 Other specified symptoms and signs involving the circulatory and respiratory systems: Secondary | ICD-10-CM | POA: Diagnosis not present

## 2019-03-16 DIAGNOSIS — N183 Chronic kidney disease, stage 3 unspecified: Secondary | ICD-10-CM | POA: Diagnosis not present

## 2019-03-16 LAB — POC HEMOCCULT BLD/STL (OFFICE/1-CARD/DIAGNOSTIC): Fecal Occult Blood, POC: NEGATIVE

## 2019-03-16 NOTE — Patient Instructions (Signed)
Chronic Kidney Disease, Adult Chronic kidney disease (CKD) occurs when the kidneys become damaged slowly over a long period of time. The kidneys are a pair of organs that do many important jobs in the body, including:  Removing waste and extra fluid from the blood to make urine.  Making hormones that maintain the amount of fluid in tissues and blood vessels.  Maintaining the right amount of fluids and chemicals in the body. A small amount of kidney damage may not cause problems, but a large amount of damage may make it hard or impossible for the kidneys to work the way they should. If steps are not taken to slow down kidney damage or to stop it from getting worse, the kidneys may stop working permanently (end-stage renal disease or ESRD). Most of the time, CKD does not go away, but it can often be controlled. People who have CKD are usually able to live normal lives. What are the causes? The most common causes of this condition are diabetes and high blood pressure (hypertension). Other causes include:  Heart and blood vessel (cardiovascular) disease.  Kidney diseases, such as: ? Glomerulonephritis. ? Interstitial nephritis. ? Polycystic kidney disease. ? Renal vascular disease.  Diseases that affect the immune system.  Genetic diseases.  Medicines that damage the kidneys, such as anti-inflammatory medicines.  Being around or being in contact with poisonous (toxic) substances.  A kidney or urinary infection that occurs again and again (recurs).  Vasculitis. This is swelling or inflammation of the blood vessels.  A problem with urine flow that may be caused by: ? Cancer. ? Having kidney stones more than one time. ? An enlarged prostate, in males. What increases the risk? You are more likely to develop this condition if you:  Are older than age 60.  Are female.  Are African-American, Hispanic, Asian, Pacific Islander, or American Indian.  Are a current or former smoker.   Are obese.  Have a family history of kidney disease or failure.  Often take medicines that are damaging to the kidneys. What are the signs or symptoms? Symptoms of this condition include:  Swelling (edema) of the face, legs, ankles, or feet.  Tiredness (lethargy) and having less energy.  Nausea or vomiting.  Confusion or trouble concentrating.  Problems with urination, such as: ? Painful or burning feeling during urination. ? Decreased urine production. ? Frequent urination, especially at night. ? Bloody urine.  Muscle twitches and cramps, especially in the legs.  Shortness of breath.  Weakness.  Loss of appetite.  Metallic taste in the mouth.  Trouble sleeping.  Dry, itchy skin.  A low blood count (anemia).  Pale lining of the eyelids and surface of the eye (conjunctiva). Symptoms develop slowly and may not be obvious until the kidney damage becomes severe. It is possible to have kidney disease for years without having any symptoms. How is this diagnosed? This condition may be diagnosed based on:  Blood tests.  Urine tests.  Imaging tests, such as an ultrasound or CT scan.  A test in which a sample of tissue is removed from the kidneys to be examined under a microscope (kidney biopsy). These test results will help your health care provider determine how serious the CKD is. How is this treated? There is no cure for most cases of this condition, but treatment usually relieves symptoms and prevents or slows the progression of the disease. Treatment may include:  Making diet changes, which may require you to avoid alcohol, salty foods (sodium),   and foods that are high in potassium, calcium, and protein.  Medicines: ? To lower blood pressure. ? To control blood glucose. ? To relieve anemia. ? To relieve swelling. ? To protect your bones. ? To improve the balance of electrolytes in your blood.  Removing toxic waste from the body through types of dialysis, if  the kidneys can no longer do their job (kidney failure).  Managing any other conditions that are causing your CKD or making it worse. Follow these instructions at home: Medicines  Take over-the-counter and prescription medicines only as told by your health care provider. The dose of some medicines that you take may need to be adjusted.  Do not take any new medicines unless approved by your health care provider. Many medicines can worsen your kidney damage.  Do not take any vitamin and mineral supplements unless approved by your health care provider. Many nutritional supplements can worsen your kidney damage. General instructions  Follow your prescribed diet as told by your health care provider.  Do not use any products that contain nicotine or tobacco, such as cigarettes and e-cigarettes. If you need help quitting, ask your health care provider.  Monitor and track your blood pressure at home. Report changes in your blood pressure as told by your health care provider.  If you are being treated for diabetes, monitor and track your blood sugar (blood glucose) levels as told by your health care provider.  Maintain a healthy weight. If you need help with this, ask your health care provider.  Start or continue an exercise plan. Exercise at least 30 minutes a day, 5 days a week.  Keep your immunizations up to date as told by your health care provider.  Keep all follow-up visits as told by your health care provider. This is important. Where to find more information  American Association of Kidney Patients: www.aakp.org  National Kidney Foundation: www.kidney.org  American Kidney Fund: www.akfinc.org  [facility]: www.lifeoptions.org and www.kidneyschool.org Contact a health care provider if:  Your symptoms get worse.  You develop new symptoms. Get help right away if:  You develop symptoms of ESRD, which include: ? Headaches. ? Numbness in the hands or  feet. ? Easy bruising. ? Frequent hiccups. ? Chest pain. ? Shortness of breath. ? Lack of menstruation, in women.  You have a fever.  You have decreased urine production.  You have pain or bleeding when you urinate. Summary  Chronic kidney disease (CKD) occurs when the kidneys become damaged slowly over a long period of time.  The most common causes of this condition are diabetes and high blood pressure (hypertension).  There is no cure for most cases of this condition, but treatment usually relieves symptoms and prevents or slows the progression of the disease. Treatment may include a combination of medicines and lifestyle changes. This information is not intended to replace advice given to you by your health care provider. Make sure you discuss any questions you have with your health care provider. Document Released: 01/27/2008 Document Revised: 04/01/2017 Document Reviewed: 05/27/2016 Elsevier Patient Education  2020 Else[employer] Inc[employer]  

## 2019-03-17 ENCOUNTER — Other Ambulatory Visit: Payer: Self-pay | Admitting: Adult Health

## 2019-03-17 DIAGNOSIS — N289 Disorder of kidney and ureter, unspecified: Secondary | ICD-10-CM

## 2019-03-17 DIAGNOSIS — N184 Chronic kidney disease, stage 4 (severe): Secondary | ICD-10-CM

## 2019-03-18 LAB — CBC WITH DIFFERENTIAL/PLATELET
Absolute Monocytes: 502 cells/uL (ref 200–950)
Basophils Absolute: 29 cells/uL (ref 0–200)
Basophils Relative: 0.5 %
Eosinophils Absolute: 51 cells/uL (ref 15–500)
Eosinophils Relative: 0.9 %
HCT: 34.8 % — ABNORMAL LOW (ref 35.0–45.0)
Hemoglobin: 11.8 g/dL (ref 11.7–15.5)
Lymphs Abs: 1465 cells/uL (ref 850–3900)
MCH: 31.5 pg (ref 27.0–33.0)
MCHC: 33.9 g/dL (ref 32.0–36.0)
MCV: 92.8 fL (ref 80.0–100.0)
MPV: 11.2 fL (ref 7.5–12.5)
Monocytes Relative: 8.8 %
Neutro Abs: 3654 cells/uL (ref 1500–7800)
Neutrophils Relative %: 64.1 %
Platelets: 252 10*3/uL (ref 140–400)
RBC: 3.75 10*6/uL — ABNORMAL LOW (ref 3.80–5.10)
RDW: 12.1 % (ref 11.0–15.0)
Total Lymphocyte: 25.7 %
WBC: 5.7 10*3/uL (ref 3.8–10.8)

## 2019-03-18 LAB — MAGNESIUM: Magnesium: 2.1 mg/dL (ref 1.5–2.5)

## 2019-03-18 LAB — URINE CULTURE
MICRO NUMBER:: 1102775
Result:: NO GROWTH
SPECIMEN QUALITY:: ADEQUATE

## 2019-03-18 LAB — CULTURE INDICATED

## 2019-03-18 LAB — BASIC METABOLIC PANEL WITH GFR
BUN/Creatinine Ratio: 17 (calc) (ref 6–22)
BUN: 34 mg/dL — ABNORMAL HIGH (ref 7–25)
CO2: 26 mmol/L (ref 20–32)
Calcium: 10 mg/dL (ref 8.6–10.4)
Chloride: 105 mmol/L (ref 98–110)
Creat: 1.95 mg/dL — ABNORMAL HIGH (ref 0.50–0.99)
GFR, Est African American: 31 mL/min/{1.73_m2} — ABNORMAL LOW (ref >=60)
GFR, Est Non African American: 27 mL/min/{1.73_m2} — ABNORMAL LOW (ref >=60)
Glucose, Bld: 86 mg/dL (ref 65–99)
Potassium: 4 mmol/L (ref 3.5–5.3)
Sodium: 138 mmol/L (ref 135–146)

## 2019-03-18 LAB — URINALYSIS W MICROSCOPIC + REFLEX CULTURE
Bacteria, UA: NONE SEEN /HPF
Bilirubin Urine: NEGATIVE
Glucose, UA: NEGATIVE
Hgb urine dipstick: NEGATIVE
Hyaline Cast: NONE SEEN /LPF
Ketones, ur: NEGATIVE
Nitrites, Initial: NEGATIVE
Protein, ur: NEGATIVE
RBC / HPF: NONE SEEN /HPF (ref 0–2)
Specific Gravity, Urine: 1.011 (ref 1.001–1.03)
Squamous Epithelial / HPF: NONE SEEN /HPF (ref <=5)
pH: 7.5 (ref 5.0–8.0)

## 2019-04-12 ENCOUNTER — Other Ambulatory Visit: Payer: Self-pay | Admitting: Nephrology

## 2019-04-12 DIAGNOSIS — D631 Anemia in chronic kidney disease: Secondary | ICD-10-CM | POA: Diagnosis not present

## 2019-04-12 DIAGNOSIS — I639 Cerebral infarction, unspecified: Secondary | ICD-10-CM | POA: Diagnosis not present

## 2019-04-12 DIAGNOSIS — N183 Chronic kidney disease, stage 3 unspecified: Secondary | ICD-10-CM

## 2019-04-12 DIAGNOSIS — N1832 Chronic kidney disease, stage 3b: Secondary | ICD-10-CM | POA: Diagnosis not present

## 2019-04-12 DIAGNOSIS — N390 Urinary tract infection, site not specified: Secondary | ICD-10-CM | POA: Diagnosis not present

## 2019-04-12 DIAGNOSIS — I129 Hypertensive chronic kidney disease with stage 1 through stage 4 chronic kidney disease, or unspecified chronic kidney disease: Secondary | ICD-10-CM | POA: Diagnosis not present

## 2019-04-18 ENCOUNTER — Other Ambulatory Visit: Payer: Self-pay | Admitting: *Deleted

## 2019-04-18 ENCOUNTER — Other Ambulatory Visit (INDEPENDENT_AMBULATORY_CARE_PROVIDER_SITE_OTHER): Payer: Medicare Other | Admitting: *Deleted

## 2019-04-18 DIAGNOSIS — Z1212 Encounter for screening for malignant neoplasm of rectum: Secondary | ICD-10-CM

## 2019-04-18 DIAGNOSIS — Z1211 Encounter for screening for malignant neoplasm of colon: Secondary | ICD-10-CM

## 2019-04-18 LAB — POC HEMOCCULT BLD/STL (HOME/3-CARD/SCREEN)
Card #2 Fecal Occult Blod, POC: NEGATIVE
Card #3 Fecal Occult Blood, POC: NEGATIVE
Fecal Occult Blood, POC: NEGATIVE

## 2019-04-23 ENCOUNTER — Other Ambulatory Visit: Payer: Medicare Other

## 2019-04-24 ENCOUNTER — Ambulatory Visit
Admission: RE | Admit: 2019-04-24 | Discharge: 2019-04-24 | Disposition: A | Discharge status: Home / Self Care | Payer: Medicare Other | Source: Ambulatory Visit | Attending: Nephrology | Admitting: Nephrology

## 2019-04-24 DIAGNOSIS — N183 Chronic kidney disease, stage 3 unspecified: Secondary | ICD-10-CM

## 2019-04-24 IMAGING — US US RENAL
1 series · 13 of 25 positions shown · non-contrast
Comparison: None.

CLINICAL DATA: Stage 3 chronic kidney disease.

EXAM:
RENAL / URINARY TRACT ULTRASOUND COMPLETE

[Series 1: us renal · 0.22mm/px · 70 acquisitions, 13 frames shown]
[im 1/70]
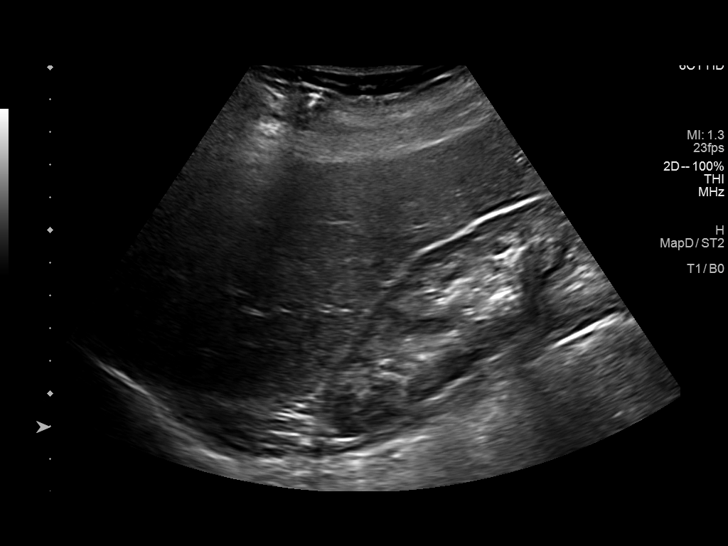
[im 6/70]
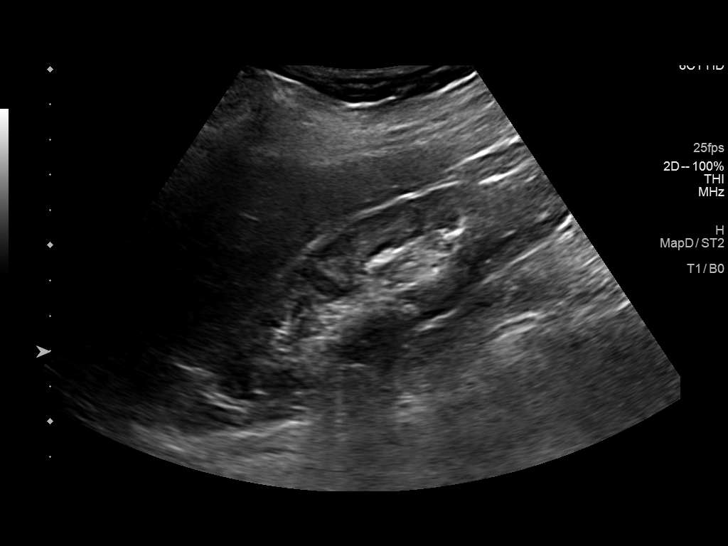
[im 12/70]
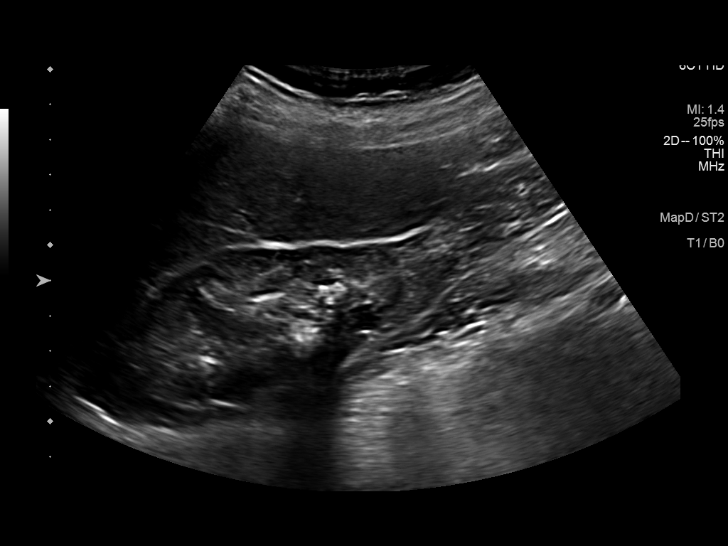
[im 18/70]
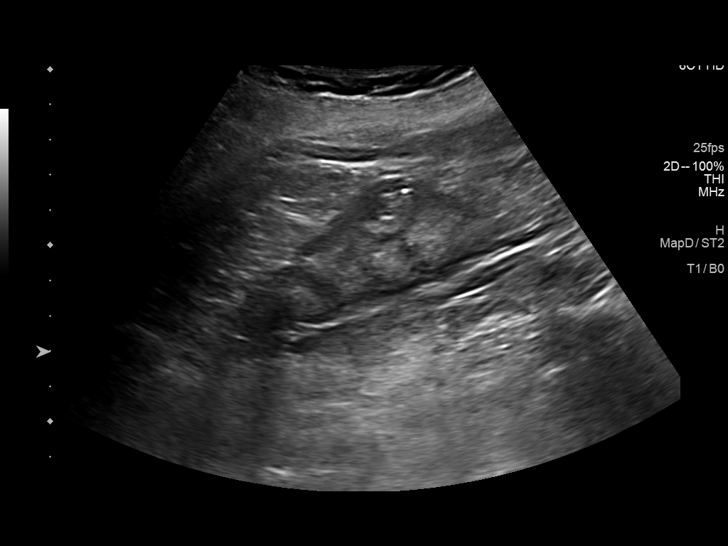
[im 24/70]
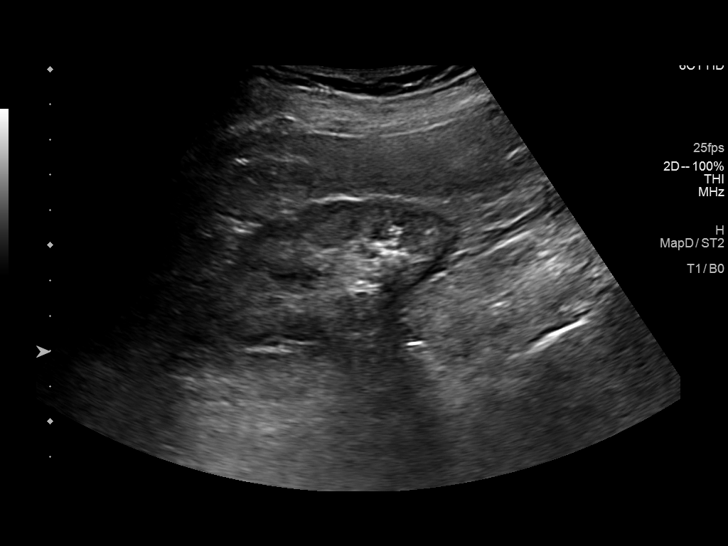
[im 29/70]
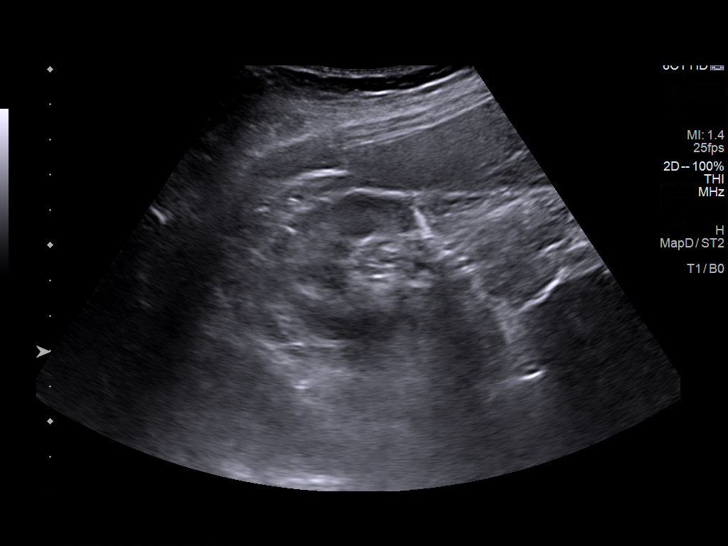
[im 35/70]
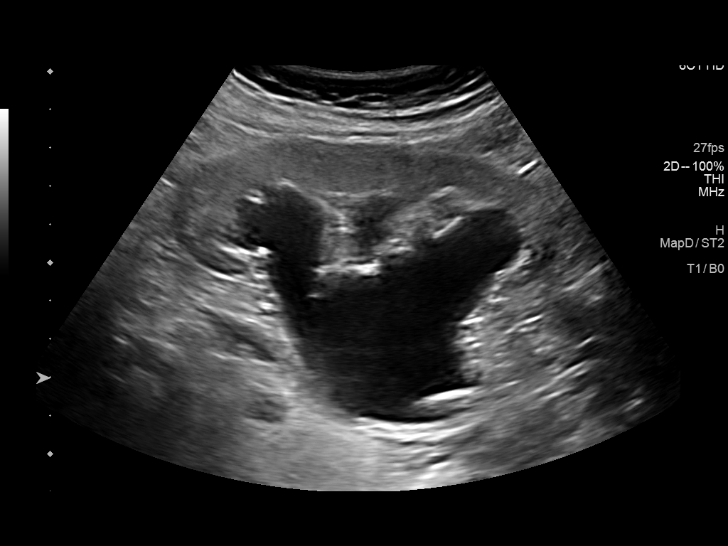
[im 41/70]
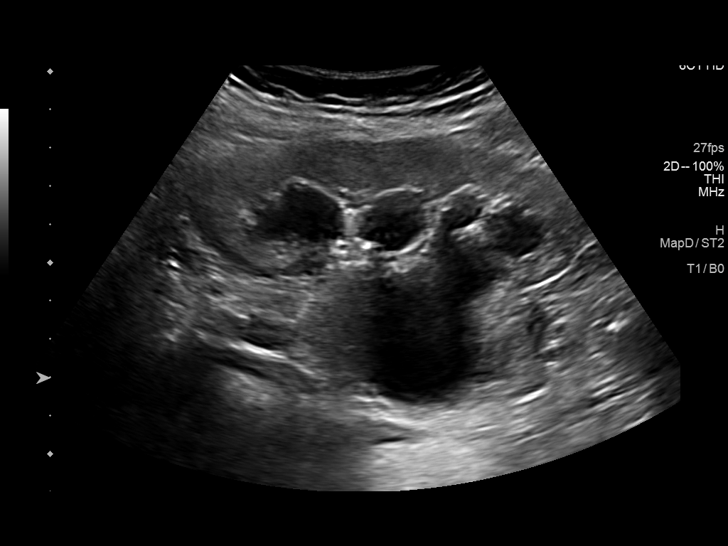
[im 47/70]
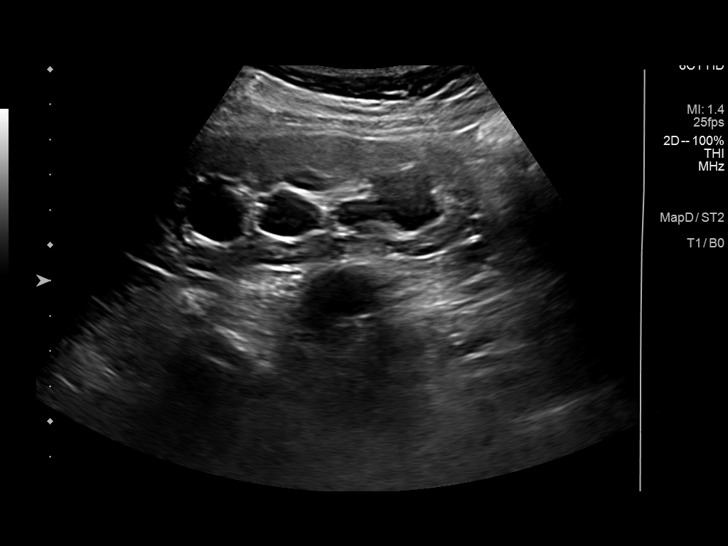
[im 52/70]
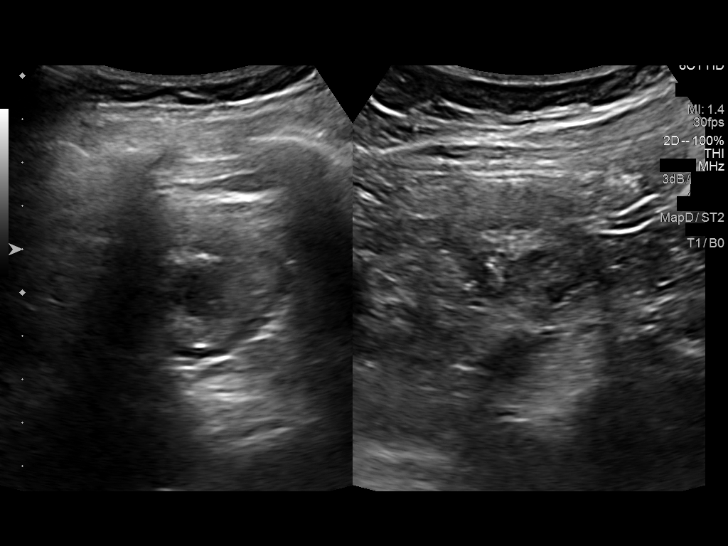
[im 58/70]
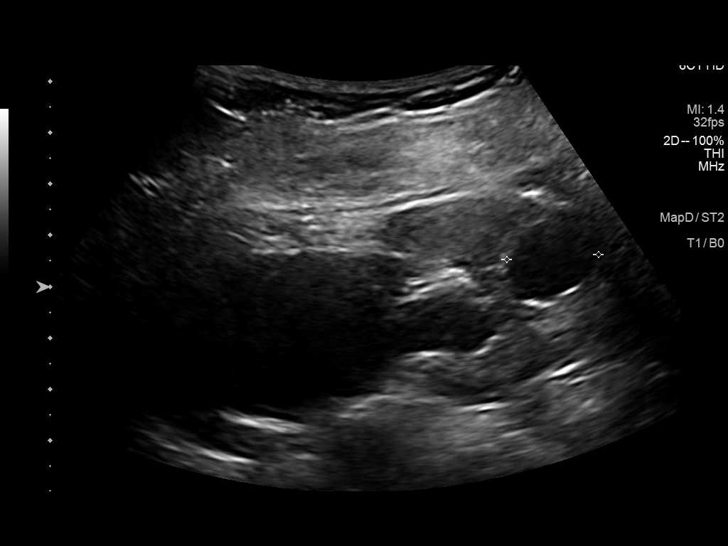
[im 64/70]
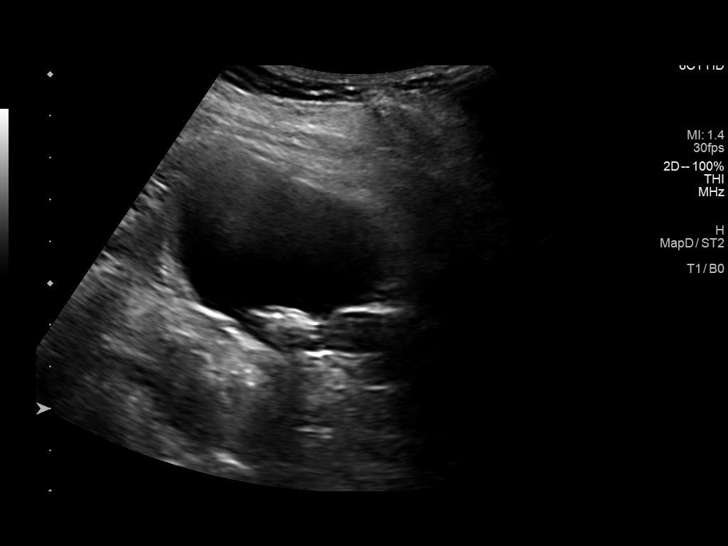
[im 70/70]
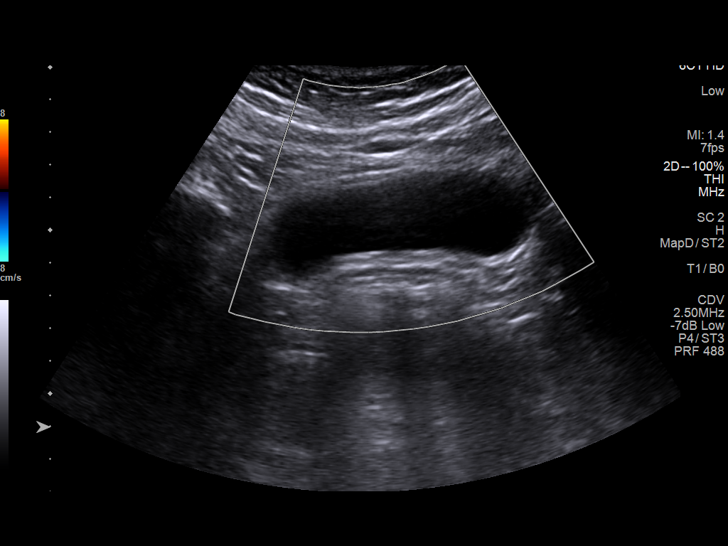

[13 of 25 positions shown; findings below may reference images not displayed]

FINDINGS: Right Kidney:

Renal measurements: 10.1 x 3.3 x 4.2 cm = volume: 74 mL. Minimally
complex 1.3 x 0.9 x 1.1 cm renal cyst in the lower right kidney with
thin internal septation. Minimally complex 1.0 x 0.8 x 1.0 cm lower
right renal cyst with thin internal septation. No hydronephrosis. A
few nonobstructing right renal stones, largest 8 mm in the
interpolar right kidney. Mildly echogenic right renal parenchyma,
normal thickness (11 mm).

Left Kidney:

Renal measurements: 10.4 x 4.2 x 4.0 cm = volume: 90 mL. Moderate to
severe left hydronephrosis with left UPJ 19 mm shadowing stone.
Echogenic left renal parenchyma, normal thickness (12 mm). Simple
2.1 x 1.7 x 1.8 cm upper left renal cyst.

Bladder:

Appears normal for degree of bladder distention. Right ureteral jet
demonstrated. No left ureteral jet demonstrated.

Other:

None.
IMPRESSION: 1. Moderate to severe left hydronephrosis with 1.9 cm left UPJ
stone. Absent left ureteral jet. Acute left urinary tract
obstruction cannot be excluded.
2. Nonobstructing right nephrolithiasis.
3. Mildly echogenic normal size kidneys bilaterally, compatible with
the reported history of chronic nonspecific renal parenchymal
disease.
4. Benign-appearing simple and minimally complex renal cysts
bilaterally.
5. Normal bladder.

These results will be called to the ordering clinician or
representative by the Radiologist Assistant, and communication
documented in the PACS or zVision Dashboard.

## 2019-05-02 DIAGNOSIS — N201 Calculus of ureter: Secondary | ICD-10-CM | POA: Diagnosis not present

## 2019-05-07 ENCOUNTER — Other Ambulatory Visit: Payer: Self-pay | Admitting: Urology

## 2019-05-08 ENCOUNTER — Other Ambulatory Visit: Payer: Self-pay

## 2019-05-08 ENCOUNTER — Encounter (HOSPITAL_COMMUNITY)
Admission: RE | Admit: 2019-05-08 | Discharge: 2019-05-08 | Disposition: A | Discharge status: Home / Self Care | Payer: Medicare Other | Source: Ambulatory Visit | Attending: Urology | Admitting: Urology

## 2019-05-08 ENCOUNTER — Other Ambulatory Visit (HOSPITAL_COMMUNITY)
Admission: RE | Admit: 2019-05-08 | Discharge: 2019-05-08 | Disposition: A | Discharge status: Home / Self Care | Payer: Medicare Other | Source: Ambulatory Visit | Attending: Urology | Admitting: Urology

## 2019-05-08 DIAGNOSIS — Z01812 Encounter for preprocedural laboratory examination: Secondary | ICD-10-CM | POA: Insufficient documentation

## 2019-05-08 DIAGNOSIS — Z20822 Contact with and (suspected) exposure to covid-19: Secondary | ICD-10-CM | POA: Insufficient documentation

## 2019-05-08 LAB — BASIC METABOLIC PANEL
Anion gap: 6 (ref 5–15)
BUN: 39 mg/dL — ABNORMAL HIGH (ref 8–23)
CO2: 27 mmol/L (ref 22–32)
Calcium: 9.7 mg/dL (ref 8.9–10.3)
Chloride: 107 mmol/L (ref 98–111)
Creatinine, Ser: 1.78 mg/dL — ABNORMAL HIGH (ref 0.44–1.00)
GFR calc Af Amer: 35 mL/min — ABNORMAL LOW (ref >60)
GFR calc non Af Amer: 30 mL/min — ABNORMAL LOW (ref >60)
Glucose, Bld: 96 mg/dL (ref 70–99)
Potassium: 3.8 mmol/L (ref 3.5–5.1)
Sodium: 140 mmol/L (ref 135–145)

## 2019-05-08 LAB — CBC
HCT: 38.8 % (ref 36.0–46.0)
Hemoglobin: 12.7 g/dL (ref 12.0–15.0)
MCH: 31.4 pg (ref 26.0–34.0)
MCHC: 32.7 g/dL (ref 30.0–36.0)
MCV: 95.8 fL (ref 80.0–100.0)
Platelets: 245 10*3/uL (ref 150–400)
RBC: 4.05 MIL/uL (ref 3.87–5.11)
RDW: 12 % (ref 11.5–15.5)
WBC: 5.9 10*3/uL (ref 4.0–10.5)
nRBC: 0 % (ref 0.0–0.2)

## 2019-05-08 LAB — SARS CORONAVIRUS 2 (TAT 6-24 HRS): SARS Coronavirus 2: NEGATIVE

## 2019-05-08 NOTE — Patient Instructions (Addendum)
DUE TO COVID-19 ONLY ONE VISITOR IS ALLOWED TO COME WITH YOU AND STAY IN THE WAITING ROOM ONLY DURING PRE OP AND PROCEDURE DAY OF SURGERY. THE 1 VISITOR MAY VISIT WITH YOU AFTER SURGERY IN YOUR PRIVATE ROOM DURING VISITING HOURS ONLY!  YOU NEED TO HAVE A COVID 19 TEST ON_______ @_______ , THIS TEST MUST BE DONE BEFORE SURGERY, COME  [address] [place], [place] , [address].  ([place]) ONCE YOUR COVID TEST IS COMPLETED, PLEASE BEGIN THE QUARANTINE INSTRUCTIONS AS OUTLINED IN YOUR HANDOUT.                Maanasa Aderhold Cromwell   Your procedure is scheduled on: 05/09/18   Report to [facility] Main  Entrance   Report to admitting at  1:30 PM     Call this number if you have problems the morning of surgery [phone]    Remember: Do not eat food or drink liquids :After Midnight.   BRUSH YOUR TEETH MORNING OF SURGERY AND RINSE YOUR MOUTH OUT, NO CHEWING GUM CANDY OR MINTS.     Take these medicines the morning of surgery with A SIP OF WATER: Topamax  DO NOT TAKE ANY DIABETIC MEDICATIONS DAY OF YOUR SURGERY                               You may not have any metal on your body including hair pins and              piercings  Do not wear jewelry, make-up, lotions, powders or perfumes, deodorant             Do not wear nail polish on your fingernails.  Do not shave  48 hours prior to surgery.           Do not bring valuables to the hospital. [place].  Contacts, dentures or bridgework may not be worn into surgery.      Patients discharged the day of surgery will not be allowed to drive home.  IF YOU ARE HAVING SURGERY AND GOING HOME THE SAME DAY, YOU MUST HAVE AN ADULT TO DRIVE YOU HOME AND BE WITH YOU FOR 24 HOURS.  YOU MAY GO HOME BY TAXI OR UBER OR ORTHERWISE, BUT AN ADULT MUST ACCOMPANY YOU HOME AND STAY WITH YOU FOR 24 HOURS.  Name and phone number of your driver:  Special Instructions: N/A              Please  read over the following fact sheets you were given: _____________________________________________________________________             [facility] - Preparing for Surgery  Before surgery, you can play an important role.   Because skin is not sterile, your skin needs to be as free of germs as possible.   You can reduce the number of germs on your skin by washing with CHG (chlorahexidine gluconate) soap before surgery.   CHG is an antiseptic cleaner which kills germs and bonds with the skin to continue killing germs even after washing. Please DO NOT use if you have an allergy to CHG or antibacterial soaps .  If your skin becomes reddened/irritated stop using the CHG and inform your nurse when you arrive at Short Stay. Do not shave (including legs and underarms) for at  least 48 hours prior to the first CHG shower.    Please follow these instructions carefully:  1.  Shower with CHG Soap the night before surgery and the  morning of Surgery.  2.  If you choose to wash your hair, wash your hair first as usual with your  normal  shampoo.  3.  After you shampoo, rinse your hair and body thoroughly to remove the  shampoo.                                        4.  Use CHG as you would any other liquid soap.  You can apply chg directly  to the skin and wash                       Gently with a scrungie or clean washcloth.  5.  Apply the CHG Soap to your body ONLY FROM THE NECK DOWN.   Do not use on face/ open                           Wound or open sores. Avoid contact with eyes, ears mouth and genitals (private parts).                       Wash face,  Genitals (private parts) with your normal soap.             6.  Wash thoroughly, paying special attention to the area where your surgery  will be performed.  7.  Thoroughly rinse your body with warm water from the neck down.  8.  DO NOT shower/wash with your normal soap after using and rinsing off  the CHG Soap.             9.  Pat yourself dry with a  clean towel.            10.  Wear clean pajamas.            11.  Place clean sheets on your bed the night of your first shower and do not  sleep with pets. Day of Surgery : Do not apply any lotions/deodorants the morning of surgery.  Please wear clean clothes to the hospital/surgery center.  FAILURE TO FOLLOW THESE INSTRUCTIONS MAY RESULT IN THE CANCELLATION OF YOUR SURGERY PATIENT SIGNATURE_________________________________  NURSE SIGNATURE__________________________________  ________________________________________________________________________

## 2019-05-09 ENCOUNTER — Encounter (HOSPITAL_COMMUNITY): Payer: Self-pay

## 2019-05-09 ENCOUNTER — Inpatient Hospital Stay (HOSPITAL_COMMUNITY): Admission: RE | Admit: 2019-05-09 | Payer: Medicare Other | Source: Ambulatory Visit

## 2019-05-09 ENCOUNTER — Other Ambulatory Visit: Payer: Self-pay

## 2019-05-09 ENCOUNTER — Encounter (HOSPITAL_COMMUNITY)
Admission: RE | Admit: 2019-05-09 | Discharge: 2019-05-09 | Disposition: A | Discharge status: Home / Self Care | Payer: Medicare Other | Source: Ambulatory Visit | Attending: Urology | Admitting: Urology

## 2019-05-09 DIAGNOSIS — N132 Hydronephrosis with renal and ureteral calculous obstruction: Secondary | ICD-10-CM | POA: Diagnosis not present

## 2019-05-09 DIAGNOSIS — N184 Chronic kidney disease, stage 4 (severe): Secondary | ICD-10-CM | POA: Diagnosis not present

## 2019-05-09 DIAGNOSIS — I129 Hypertensive chronic kidney disease with stage 1 through stage 4 chronic kidney disease, or unspecified chronic kidney disease: Secondary | ICD-10-CM | POA: Diagnosis not present

## 2019-05-09 DIAGNOSIS — E119 Type 2 diabetes mellitus without complications: Secondary | ICD-10-CM | POA: Diagnosis not present

## 2019-05-09 DIAGNOSIS — Z01818 Encounter for other preprocedural examination: Secondary | ICD-10-CM | POA: Insufficient documentation

## 2019-05-09 DIAGNOSIS — E559 Vitamin D deficiency, unspecified: Secondary | ICD-10-CM | POA: Diagnosis not present

## 2019-05-09 DIAGNOSIS — G40219 Localization-related (focal) (partial) symptomatic epilepsy and epileptic syndromes with complex partial seizures, intractable, without status epilepticus: Secondary | ICD-10-CM | POA: Diagnosis not present

## 2019-05-09 DIAGNOSIS — I69351 Hemiplegia and hemiparesis following cerebral infarction affecting right dominant side: Secondary | ICD-10-CM | POA: Diagnosis not present

## 2019-05-09 DIAGNOSIS — Z79899 Other long term (current) drug therapy: Secondary | ICD-10-CM | POA: Diagnosis not present

## 2019-05-09 NOTE — H&P (Signed)
Office Visit Report     05/02/2019   --------------------------------------------------------------------------------   Tonya Thompson  MRN: [id number]  DOB: 29-Mar-1956, 64 year old Female  SSN:    PRIMARY CARE:    REFERRING:  Louis Meckel, MD  PROVIDER:  Raynelle Bring, M.D.  LOCATION:  [facility] [phone]     --------------------------------------------------------------------------------   CC/HPI: Left UPJ calculus   [name] is a 64 year old female seen today at the request of Dr. Corliss Parish for a large left UPJ calculus. 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 She did undergo surgery with partial resection of her AVM. Her last stroke was in 1984. She also has a history of resultant seizures that have been well controlled with medical therapy. She does carry a diagnosis of a history of diabetes but had significantly changed her diet over the past 6-8 months. As part of her dietary change, she did lose approximately 20 lb from approximately 160 lb than 0 about 140 lb. Her weight has now stabilized. Apparently, she has not required medical therapy for treatment of her diabetes. She had a known baseline serum creatinine of 1.23 that had subsequently increased and was noted to be 1.95 on 03/16/2019. This prompted her Nephrology evaluation. Her serum creatinine on 04/12/2019 was 1.98 with an estimated GFR of 27 mL/min. As part of her evaluation, she underwent a renal ultrasound that reportedly demonstrated moderate to severe left hydronephrosis with a 1.9 cm left UPJ calculus. She has been completely asymptomatic and denies any pain symptoms. She denies any hematuria. She denies any history of urolithiasis.     ALLERGIES: Aspirin Cheese Chocolate coffee    MEDICATIONS: Allegra Allergy  Baclofen 10 mg tablet  Calcium  Multivitamin  Omega 3  Potassium   Topamax  Vitamin D     GU PSH: None   NON-GU PSH: Brain surgery     GU PMH: None   NON-GU PMH: Arthritis Diabetes Type 2 GERD Seizure disorder Stroke/TIA    FAMILY HISTORY: None   SOCIAL HISTORY: None   REVIEW OF SYSTEMS:    GU Review Female:   Patient reports get up at night to urinate. Patient denies frequent urination, hard to postpone urination, burning /pain with urination, leakage of urine, stream starts and stops, trouble starting your stream, have to strain to urinate, and currently pregnant.  Gastrointestinal (Upper):   Patient denies nausea and vomiting.  Gastrointestinal (Lower):   Patient reports constipation. Patient denies diarrhea.  Constitutional:   Patient denies fever, night sweats, weight loss, and fatigue.  Skin:   Patient denies skin rash/ lesion and itching.  Eyes:   Patient denies blurred vision and double vision.  Ears/ Nose/ Throat:   Patient denies sore throat and sinus problems.  Hematologic/Lymphatic:   Patient denies swollen glands and easy bruising.  Cardiovascular:   Patient denies leg swelling and chest pains.  Respiratory:   Patient denies cough and shortness of breath.  Endocrine:   Patient denies excessive thirst.  Musculoskeletal:   Patient denies back pain and joint pain.  Neurological:   Patient denies headaches and dizziness.  Psychologic:   Patient denies depression and anxiety.   VITAL SIGNS:      05/02/2019 11:56 AM  Weight 146 lb / 66.22 kg  Height 66 in / 167.64 cm  BP 128/79 mmHg  Pulse 78 /min  Temperature 97.8 F / 36.5  C  BMI 23.6 kg/m   MULTI-SYSTEM PHYSICAL EXAMINATION:    Constitutional: Well-nourished. No physical deformities. Normally developed. Good grooming.  Neck: Neck symmetrical, not swollen. Normal tracheal position.  Respiratory: Decreased breath sounds but clear.  Cardiovascular: Normal temperature, normal extremity pulses, no swelling, no varicosities. Regular rate and rhythm.  Lymphatic: No  enlargement of neck, axillae, groin.  Skin: No paleness, no jaundice, no cyanosis. No lesion, no ulcer, no rash.  Neurologic / Psychiatric: Severe weakness of her right upper and lower extremities. She is ambulatory with a cane.  Gastrointestinal: No mass, no tenderness, no rigidity, non obese abdomen.  Eyes: Normal conjunctivae. Normal eyelids.  Ears, Nose, Mouth, and Throat: Left ear no scars, no lesions, no masses. Right ear no scars, no lesions, no masses. Nose no scars, no lesions, no masses. Normal hearing. Normal lips.  Musculoskeletal: Normal gait and station of head and neck.     PAST DATA REVIEWED:  Source Of History:  Patient  Lab Test Review:   BMP  Records Review:   Previous Patient Records  Urine Test Review:   Urinalysis  X-Ray Review: C.T. Stone Protocol: Reviewed Films.    Notes:                     CLINICAL DATA: 64 year old female with history of gross hematuria.  Left hydronephrosis on ultrasound. 20 pound weight loss over the  past 6 months. No pain, nausea, vomiting or fever.   EXAM:  CT ABDOMEN AND PELVIS WITHOUT CONTRAST   TECHNIQUE:  Multidetector CT imaging of the abdomen and pelvis was performed  following the standard protocol without IV contrast.   COMPARISON: No priors.   FINDINGS:  Lower chest: Unremarkable.   Hepatobiliary: No suspicious cystic or solid hepatic lesions are  confidently identified on today's noncontrast CT examination.  Unenhanced appearance of the gallbladder is normal.   Pancreas: No definite pancreatic mass or peripancreatic fluid  collections or inflammatory changes noted on today's noncontrast CT  examination.   Spleen: Unremarkable.   Adrenals/Urinary Tract: Multiple nonobstructive calculi are noted  within the collecting systems of both kidneys, largest of which is  in the lower pole the left kidney measuring up to 1.8 cm. In  addition, in the distal third of the left ureter (coronal image 81  of series 601 and axial  image 74 of series 2 there appears to be a  cluster of stones overall measuring approximately 2.0 x 0.6 x 0.7  cm. This is not associated with proximal hydroureter, although there  is mild left hydronephrosis which terminates at the level of the  ureteropelvic junction, which may suggest the presence of a mild  stricture in this region. No right ureteral stones. No calculi  within the urinary bladder. Unenhanced appearance of the right  kidney is otherwise unremarkable. In the upper pole of the left  kidney there is a 1.3 cm low-attenuation lesion, incompletely  characterized on today's non-contrast CT examination, but  statistically likely to represent a cyst. Urinary bladder is normal  in appearance. Bilateral adrenal glands are normal in appearance.   Stomach/Bowel: Normal appearance of the stomach. No pathologic  dilatation of small bowel or colon. Normal appendix.   Vascular/Lymphatic: Aortic atherosclerosis, without evidence of  aneurysm or dissection in the abdominal or pelvic vasculature. No  lymphadenopathy noted in the abdomen or pelvis.   Reproductive: Bilateral tubal ligation clips. Uterus and ovaries are  trophic.   Other: No significant volume of ascites. No  pneumoperitoneum.   Musculoskeletal: There are no aggressive appearing lytic or blastic  lesions noted in the visualized portions of the skeleton.   IMPRESSION:  1. In addition to multiple nonobstructive calculi in the collecting  systems of both kidneys (measuring up to 1.8 cm in the lower pole  collecting system of left kidney) there is a cluster of calculi in  the distal third of the left ureter. This is not associated with  proximal hydroureter, although there is mild left hydronephrosis  which terminates at the left ureteropelvic junction, which suggest  the presence of a mild left UPJ stricture.  2. Aortic atherosclerosis.  3. Additional incidental findings, as above.    Electronically Signed  By:  Vinnie Langton M.D.  On: 05/02/2019 15:01    PROCEDURES:         C.T. Urogram - [id number]      Patient confirmed No Neulasta OnPro Device.           Urinalysis Dipstick Dipstick Cont'd  Color: Yellow Bilirubin: Neg mg/dL  Appearance: Clear Ketones: Neg mg/dL  Specific Gravity: 1.015 Blood: Neg ery/uL  pH: 6.5 Protein: Trace mg/dL  Glucose: Neg mg/dL Urobilinogen: 0.2 mg/dL    Nitrites: Neg    Leukocyte Esterase: Neg leu/uL    ASSESSMENT:      ICD-10 Details  1 GU:   Ureteral calculus - N20.1    PLAN:           Orders Labs BMP  X-Rays: C.T. Stone Protocol Without Contrast  X-Ray Notes: History:  Hematuria: Yes/No  Patient to see MD after exam: Yes/No  Previous exam: CT / IVP/ US/ KUB/ None  When:  Where:  Diabetic: Yes/ No  BUN/ Creatine:  Date of last BUN Creatinine:  Weight in pounds:  Allergy- Contrasts/ Shellfish: Yes/ No  Conflicting diabetic meds: Yes/ No  Oral contrast and instructions given to patient:   Prior Authorization #: NA for Medicare/Auth # [id number] valid 05/02/19 thru 10/29/19 for secondary            Schedule Return Visit/Planned Activity: Other See Visit Notes             Note: Will call to schedule surgery          Document Letter(s):  Created for Patient: Clinical Summary         Notes:   1. Left ureteral calculi: Interestingly, her CT scan does not demonstrate an obstructing proximal stone the rather obstructing distal ureteral calculi. Considering her recent noted renal dysfunction, I have recommended that she proceed with evaluation in the near future. It is somewhat concerning that she is completely asymptomatic suggesting that this may be more of a chronic obstruction that may have impacted her left relative renal function. She will undergo cystoscopy with left retrograde pyelography, left ureteroscopic laser lithotripsy, and left ureteral stent placement. If her stone is severely impacted, she understands that she may  under go left nephrostomy tube placement to decompress her kidney prior to further evaluation from an antegrade fashion. Will not plan to address her left renal calculus at this time but will need to discuss further definitive management of this at a future date after relief of her acute obstruction. Her renal function will be checked today to ensure that she does not require more urgent care. Otherwise, will plan to perform this hopefully over the next week.   Cc: Dr. Unk Pinto  Dr. Corliss Parish     * Signed by Raynelle Bring, M.D. on  05/02/19 at 9:36 PM (EST)*       APPENDED NOTES:  I spoke with patient's sister about her history. While she lives independently, she has some challenges with this. As such, she will need overnight observation. I have fully informed her sister of the situation and plan and she is ageeable to proceed.     * Signed by Raynelle Bring, M.D. on 05/03/19 at 9:49 AM (EST)*

## 2019-05-09 NOTE — Progress Notes (Signed)
PCP -Dr. Melford Aase  Neurologist - Dr. Cherlynn Polo  Chest x-ray - no EKG - 08/22/18 Stress Test - no ECHO - no Cardiac Cath - no  Sleep Study - no CPAP -   Fasting Blood Sugar - no Checks Blood Sugar _____ times a day  Blood Thinner Instructions:no Aspirin Instructions: Last Dose:  Anesthesia review:   Patient denies shortness of breath, fever, cough and chest pain at PAT appointment yes  Patient verbalized understanding of instructions that were given to them at the PAT appointment. Patient was also instructed that they will need to review over the PAT instructions again at home before surgery. yes

## 2019-05-10 ENCOUNTER — Encounter (HOSPITAL_COMMUNITY): Payer: Self-pay | Admitting: Urology

## 2019-05-10 ENCOUNTER — Other Ambulatory Visit: Payer: Self-pay

## 2019-05-10 ENCOUNTER — Ambulatory Visit (HOSPITAL_COMMUNITY): Payer: Medicare Other

## 2019-05-10 ENCOUNTER — Ambulatory Visit (HOSPITAL_COMMUNITY): Payer: Medicare Other | Admitting: Anesthesiology

## 2019-05-10 ENCOUNTER — Observation Stay (HOSPITAL_COMMUNITY)
Admission: RE | Admit: 2019-05-10 | Discharge: 2019-05-11 | Disposition: A | Discharge status: Home / Self Care | Payer: Medicare Other | Source: Other Acute Inpatient Hospital | Attending: Urology | Admitting: Urology

## 2019-05-10 ENCOUNTER — Encounter (HOSPITAL_COMMUNITY)
Admission: RE | Disposition: A | Discharge status: Home / Self Care | Payer: Self-pay | Source: Other Acute Inpatient Hospital | Attending: Urology

## 2019-05-10 ENCOUNTER — Ambulatory Visit (HOSPITAL_COMMUNITY): Payer: Medicare Other | Admitting: Physician Assistant

## 2019-05-10 DIAGNOSIS — G40219 Localization-related (focal) (partial) symptomatic epilepsy and epileptic syndromes with complex partial seizures, intractable, without status epilepticus: Secondary | ICD-10-CM | POA: Diagnosis not present

## 2019-05-10 DIAGNOSIS — I69351 Hemiplegia and hemiparesis following cerebral infarction affecting right dominant side: Secondary | ICD-10-CM | POA: Diagnosis not present

## 2019-05-10 DIAGNOSIS — Z01818 Encounter for other preprocedural examination: Secondary | ICD-10-CM | POA: Diagnosis not present

## 2019-05-10 DIAGNOSIS — N132 Hydronephrosis with renal and ureteral calculous obstruction: Secondary | ICD-10-CM | POA: Diagnosis not present

## 2019-05-10 DIAGNOSIS — Z79899 Other long term (current) drug therapy: Secondary | ICD-10-CM | POA: Diagnosis not present

## 2019-05-10 DIAGNOSIS — N202 Calculus of kidney with calculus of ureter: Secondary | ICD-10-CM | POA: Diagnosis not present

## 2019-05-10 DIAGNOSIS — E559 Vitamin D deficiency, unspecified: Secondary | ICD-10-CM | POA: Diagnosis not present

## 2019-05-10 DIAGNOSIS — N201 Calculus of ureter: Secondary | ICD-10-CM

## 2019-05-10 DIAGNOSIS — I129 Hypertensive chronic kidney disease with stage 1 through stage 4 chronic kidney disease, or unspecified chronic kidney disease: Secondary | ICD-10-CM | POA: Diagnosis not present

## 2019-05-10 DIAGNOSIS — E119 Type 2 diabetes mellitus without complications: Secondary | ICD-10-CM | POA: Diagnosis not present

## 2019-05-10 DIAGNOSIS — N184 Chronic kidney disease, stage 4 (severe): Secondary | ICD-10-CM | POA: Diagnosis not present

## 2019-05-10 HISTORY — DX: Calculus of ureter: N20.1

## 2019-05-10 HISTORY — PX: CYSTOSCOPY/URETEROSCOPY/HOLMIUM LASER/STENT PLACEMENT: SHX6546

## 2019-05-10 SURGERY — CYSTOSCOPY/URETEROSCOPY/HOLMIUM LASER/STENT PLACEMENT
Anesthesia: General | Laterality: Left

## 2019-05-10 MED ORDER — SODIUM CHLORIDE 0.9% FLUSH: 3.0000 mL | INTRAVENOUS | Status: DC | PRN

## 2019-05-10 MED ORDER — BUTALBITAL-APAP-CAFFEINE 50-325-40 MG PO TABS: 1.0000 | ORAL_TABLET | Freq: Two times a day (BID) | ORAL | Status: DC | PRN

## 2019-05-10 MED ORDER — ACETAMINOPHEN 325 MG PO TABS: 650.0000 mg | ORAL_TABLET | ORAL | Status: DC | PRN

## 2019-05-10 MED ORDER — FENTANYL CITRATE (PF) 100 MCG/2ML IJ SOLN
INTRAMUSCULAR | Status: AC
  Filled 2019-05-10: qty 2

## 2019-05-10 MED ORDER — PROPOFOL 10 MG/ML IV BOLUS
INTRAVENOUS | Status: DC | PRN
  Administered 2019-05-10: 150 mg via INTRAVENOUS

## 2019-05-10 MED ORDER — POLYVINYL ALCOHOL 1.4 % OP SOLN
1.0000 [drp] | OPHTHALMIC | Status: DC | PRN
  Filled 2019-05-10: qty 15

## 2019-05-10 MED ORDER — ONDANSETRON HCL 4 MG/2ML IJ SOLN: 4.0000 mg | INTRAMUSCULAR | Status: DC | PRN

## 2019-05-10 MED ORDER — ENSURE ENLIVE PO LIQD
237.0000 mL | Freq: Two times a day (BID) | ORAL | Status: DC
  Administered 2019-05-11: 237 mL via ORAL

## 2019-05-10 MED ORDER — LIDOCAINE 2% (20 MG/ML) 5 ML SYRINGE
INTRAMUSCULAR | Status: DC | PRN
  Administered 2019-05-10: 50 mg via INTRAVENOUS

## 2019-05-10 MED ORDER — IOHEXOL 300 MG/ML  SOLN
INTRAMUSCULAR | Status: DC | PRN
  Administered 2019-05-10: 6 mL via URETHRAL

## 2019-05-10 MED ORDER — PROPYLENE GLYCOL 0.6 % OP SOLN: 1.0000 [drp] | Freq: Two times a day (BID) | OPHTHALMIC | Status: DC

## 2019-05-10 MED ORDER — DIPHENHYDRAMINE HCL 12.5 MG/5ML PO ELIX: 12.5000 mg | ORAL_SOLUTION | Freq: Four times a day (QID) | ORAL | Status: DC | PRN

## 2019-05-10 MED ORDER — MIDAZOLAM HCL 2 MG/2ML IJ SOLN
INTRAMUSCULAR | Status: AC
  Filled 2019-05-10: qty 2

## 2019-05-10 MED ORDER — SODIUM CHLORIDE 0.9 % IV SOLN: 250.0000 mL | INTRAVENOUS | Status: DC | PRN

## 2019-05-10 MED ORDER — SODIUM CHLORIDE 0.9 % IR SOLN
Status: DC | PRN
  Administered 2019-05-10: 3000 mL via INTRAVESICAL

## 2019-05-10 MED ORDER — BACLOFEN 10 MG PO TABS
10.0000 mg | ORAL_TABLET | Freq: Every day | ORAL | Status: DC
  Administered 2019-05-10: 10 mg via ORAL
  Filled 2019-05-10: qty 1

## 2019-05-10 MED ORDER — DIPHENHYDRAMINE HCL 50 MG/ML IJ SOLN: 12.5000 mg | Freq: Four times a day (QID) | INTRAMUSCULAR | Status: DC | PRN

## 2019-05-10 MED ORDER — TRAMADOL HCL 50 MG PO TABS: 50.0000 mg | ORAL_TABLET | Freq: Four times a day (QID) | ORAL | 0 refills | Status: DC | PRN

## 2019-05-10 MED ORDER — MIDAZOLAM HCL 2 MG/2ML IJ SOLN
INTRAMUSCULAR | Status: DC | PRN
  Administered 2019-05-10 (×2): 1 mg via INTRAVENOUS

## 2019-05-10 MED ORDER — VITAMIN D 25 MCG (1000 UNIT) PO TABS
1000.0000 [IU] | ORAL_TABLET | Freq: Two times a day (BID) | ORAL | Status: DC
  Administered 2019-05-10 – 2019-05-11 (×2): 1000 [IU] via ORAL
  Filled 2019-05-10 (×2): qty 1

## 2019-05-10 MED ORDER — 0.9 % SODIUM CHLORIDE (POUR BTL) OPTIME
TOPICAL | Status: DC | PRN
  Administered 2019-05-10: 1000 mL

## 2019-05-10 MED ORDER — PROPOFOL 10 MG/ML IV BOLUS
INTRAVENOUS | Status: AC
  Filled 2019-05-10: qty 20

## 2019-05-10 MED ORDER — ONDANSETRON HCL 4 MG/2ML IJ SOLN
INTRAMUSCULAR | Status: DC | PRN
  Administered 2019-05-10: 4 mg via INTRAVENOUS

## 2019-05-10 MED ORDER — LACTATED RINGERS IV SOLN: INTRAVENOUS | Status: DC

## 2019-05-10 MED ORDER — TOPIRAMATE 100 MG PO TABS: 300.0000 mg | ORAL_TABLET | Freq: Two times a day (BID) | ORAL | Status: DC

## 2019-05-10 MED ORDER — PHENYLEPHRINE HCL-NACL 10-0.9 MG/250ML-% IV SOLN
INTRAVENOUS | Status: DC | PRN
  Administered 2019-05-10: 25 ug/min via INTRAVENOUS

## 2019-05-10 MED ORDER — LIDOCAINE 2% (20 MG/ML) 5 ML SYRINGE
INTRAMUSCULAR | Status: AC
  Filled 2019-05-10: qty 5

## 2019-05-10 MED ORDER — LORATADINE 10 MG PO TABS
10.0000 mg | ORAL_TABLET | Freq: Every day | ORAL | Status: DC
  Filled 2019-05-10: qty 1

## 2019-05-10 MED ORDER — CALCIUM CARBONATE ANTACID 500 MG PO CHEW
1.0000 | CHEWABLE_TABLET | Freq: Every day | ORAL | Status: DC
  Administered 2019-05-11: 200 mg via ORAL
  Filled 2019-05-10: qty 1

## 2019-05-10 MED ORDER — TRAMADOL HCL 50 MG PO TABS: 50.0000 mg | ORAL_TABLET | Freq: Four times a day (QID) | ORAL | Status: DC | PRN

## 2019-05-10 MED ORDER — CEFAZOLIN SODIUM-DEXTROSE 2-4 GM/100ML-% IV SOLN
2.0000 g | Freq: Once | INTRAVENOUS | Status: AC
  Administered 2019-05-10: 2 g via INTRAVENOUS
  Filled 2019-05-10: qty 100

## 2019-05-10 MED ORDER — SODIUM CHLORIDE 0.9% FLUSH
3.0000 mL | Freq: Two times a day (BID) | INTRAVENOUS | Status: DC
  Administered 2019-05-10 – 2019-05-11 (×2): 3 mL via INTRAVENOUS

## 2019-05-10 MED ORDER — FENTANYL CITRATE (PF) 100 MCG/2ML IJ SOLN
INTRAMUSCULAR | Status: DC | PRN
  Administered 2019-05-10 (×2): 50 ug via INTRAVENOUS

## 2019-05-10 SURGICAL SUPPLY — 19 items
BAG URO CATCHER STRL LF (MISCELLANEOUS) ×2 IMPLANT
BASKET ZERO TIP NITINOL 2.4FR (BASKET) ×2 IMPLANT
CATH INTERMIT  6FR 70CM (CATHETERS) ×2 IMPLANT
CLOTH BEACON ORANGE TIMEOUT ST (SAFETY) IMPLANT
FIBER LASER FLEXIVA 365 (UROLOGICAL SUPPLIES) ×2 IMPLANT
FIBER LASER TRAC TIP (UROLOGICAL SUPPLIES) IMPLANT
GLOVE BIOGEL M STRL SZ7.5 (GLOVE) ×2 IMPLANT
GOWN STRL REUS W/TWL LRG LVL3 (GOWN DISPOSABLE) ×2 IMPLANT
GUIDEWIRE ANG ZIPWIRE 038X150 (WIRE) IMPLANT
GUIDEWIRE STR DUAL SENSOR (WIRE) ×4 IMPLANT
IV NS 1000ML (IV SOLUTION)
IV NS 1000ML BAXH (IV SOLUTION) IMPLANT
KIT TURNOVER KIT A (KITS) ×2 IMPLANT
MANIFOLD NEPTUNE II (INSTRUMENTS) ×2 IMPLANT
PACK CYSTO (CUSTOM PROCEDURE TRAY) ×2 IMPLANT
SHEATH URETERAL 12FRX35CM (MISCELLANEOUS) IMPLANT
STENT URET 6FRX24 CONTOUR (STENTS) ×2 IMPLANT
TUBING CONNECTING 10 (TUBING) ×2 IMPLANT
TUBING UROLOGY SET (TUBING) ×2 IMPLANT

## 2019-05-10 NOTE — Op Note (Signed)
Preoperative diagnosis: Left ureteral and renal calculi  Postoperative diagnosis: Left ureteral and renal calculi  Procedure:  1. Cystoscopy 2. Left ureteroscopy and stone removal 3. Ureteroscopic laser lithotripsy 4. Left ureteral stent placement (6 x 24 - no string)  5. Left retrograde pyelography with interpretation  Surgeon: Pryor Curia. M.D.  Anesthesia: General  Complications: None  Intraoperative findings: Left retrograde pyelography demonstrated a filling defect within the distal ureter and lower pole renal collecting system consistent with the patient's known calculi without other abnormalities.  EBL: Minimal  Specimens: 1. Left ureteral calculus  Disposition of specimens: [facility] for stone analysis  Indication: Tonya Thompson is a 64 y.o. year old patient with urolithiasis and a large 2 cm distal left ureteral stone with AKI and a large left renal calculus. After reviewing the management options for treatment, the patient elected to proceed with the above surgical procedure(s). We have discussed the potential benefits and risks of the procedure, side effects of the proposed treatment, the likelihood of the patient achieving the goals of the procedure, and any potential problems that might occur during the procedure or recuperation. Informed consent has been obtained.  Description of procedure:  The patient was taken to the operating room and general anesthesia was induced.  The patient was placed in the dorsal lithotomy position, prepped and draped in the usual sterile fashion, and preoperative antibiotics were administered. A preoperative time-out was performed.   Cystourethroscopy was performed.  The patient's urethra was examined and was normal. The bladder was then systematically examined in its entirety. There was no evidence for any bladder tumors, stones, or other mucosal pathology.    Attention then turned to the left ureteral  orifice and a ureteral catheter was used to intubate the ureteral orifice.  Omnipaque contrast was injected through the ureteral catheter and a retrograde pyelogram was performed with findings as dictated above.  A 0.38 sensor guidewire was then advanced up the left ureter into the renal pelvis under fluoroscopic guidance. The 6 Fr semirigid ureteroscope was then advanced into the ureter next to the guidewire and the calculus was identified.   The stone was then fragmented with the 365 micron holmium laser fiber on a setting of 0.6 J and frequency of 6 Hz.   All stones were then removed from the ureter with a zero tip nitinol basket.  Reinspection of the ureter revealed no remaining visible stones or fragments.   The wire was then backloaded through the cystoscope and a ureteral stent was advance over the wire using Seldinger technique.  The stent was positioned appropriately under fluoroscopic and cystoscopic guidance.  The wire was then removed with an adequate stent curl noted in the renal pelvis as well as in the bladder.  The bladder was then emptied and the procedure ended.  The patient appeared to tolerate the procedure well and without complications.  The patient was able to be awakened and transferred to the recovery unit in satisfactory condition.

## 2019-05-10 NOTE — Transfer of Care (Signed)
Immediate Anesthesia Transfer of Care Note  Patient: Tonya Thompson  Procedure(s) Performed: CYSTOSCOPY/RETROGRADE/URETEROSCOPY/HOLMIUM LASER/STENT PLACEMENT (Left )  Patient Location: PACU  Anesthesia Type:General  Level of Consciousness: awake and alert   Airway & Oxygen Therapy: Patient Spontanous Breathing and Patient connected to face mask oxygen  Post-op Assessment: Report given to RN and Post -op Vital signs reviewed and stable  Post vital signs: Reviewed and stable  Last Vitals:  Vitals Value Taken Time  BP    Temp    Pulse 70 05/10/19 1648  Resp 11 05/10/19 1648  SpO2 100 % 05/10/19 1648  Vitals shown include unvalidated device data.  Last Pain:  Vitals:   05/10/19 1401  TempSrc:   PainSc: 0-No pain         Complications: No apparent anesthesia complications

## 2019-05-10 NOTE — Discharge Instructions (Signed)

## 2019-05-10 NOTE — Interval H&P Note (Signed)
History and Physical Interval Note:  05/10/2019 3:08 PM  Tonya Thompson  has presented today for surgery, with the diagnosis of LEFT URETERAL CALCULI.  The various methods of treatment have been discussed with the patient and family. After consideration of risks, benefits and other options for treatment, the patient has consented to  Procedure(s): CYSTOSCOPY/RETROGRADE/URETEROSCOPY/HOLMIUM LASER/STENT PLACEMENT (Left) as a surgical intervention.  The patient's history has been reviewed, patient examined, no change in status, stable for surgery.  I have reviewed the patient's chart and labs.  Questions were answered to the patient's satisfaction.     [employer]

## 2019-05-10 NOTE — Anesthesia Preprocedure Evaluation (Addendum)
Anesthesia Evaluation  Patient identified by MRN, date of birth, ID band Patient awake    Reviewed: Allergy & Precautions, NPO status , Patient's Chart, lab work & pertinent test results  Airway Mallampati: I  TM Distance: >3 FB Neck ROM: Full    Dental  (+) Dental Advisory Given, Teeth Intact   Pulmonary neg pulmonary ROS,    breath sounds clear to auscultation       Cardiovascular hypertension,  Rhythm:Regular Rate:Normal     Neuro/Psych  Headaches, Seizures -,  CVA, Residual Symptoms negative psych ROS   GI/Hepatic negative GI ROS, Neg liver ROS,   Endo/Other  negative endocrine ROS  Renal/GU      Musculoskeletal negative musculoskeletal ROS (+)   Abdominal Normal abdominal exam  (+)   Peds  Hematology negative hematology ROS (+)   Anesthesia Other Findings - R sided weakness - R hand spasticity   Reproductive/Obstetrics                           Anesthesia Physical Anesthesia Plan  ASA: III  Anesthesia Plan: General   Post-op Pain Management:    Induction: Intravenous  PONV Risk Score and Plan: 4 or greater and Ondansetron and Treatment may vary due to age or medical condition  Airway Management Planned: LMA  Additional Equipment: None  Intra-op Plan:   Post-operative Plan: Extubation in OR  Informed Consent: I have reviewed the patients History and Physical, chart, labs and discussed the procedure including the risks, benefits and alternatives for the proposed anesthesia with the patient or authorized representative who has indicated his/her understanding and acceptance.       Plan Discussed with: CRNA  Anesthesia Plan Comments:        Anesthesia Quick Evaluation

## 2019-05-10 NOTE — Anesthesia Procedure Notes (Signed)
Date/Time: 05/10/2019 4:35 PM Performed by: Cynda Familia, CRNA Oxygen Delivery Method: Simple face mask Placement Confirmation: positive ETCO2 and breath sounds checked- equal and bilateral Dental Injury: Teeth and Oropharynx as per pre-operative assessment

## 2019-05-10 NOTE — Anesthesia Procedure Notes (Signed)
Procedure Name: LMA Insertion Date/Time: 05/10/2019 3:47 PM Performed by: Cynda Familia, CRNA Pre-anesthesia Checklist: Patient identified, Emergency Drugs available, Suction available and Patient being monitored Patient Re-evaluated:Patient Re-evaluated prior to induction Oxygen Delivery Method: Circle System Utilized Preoxygenation: Pre-oxygenation with 100% oxygen Induction Type: IV induction Ventilation: Mask ventilation without difficulty LMA: LMA inserted LMA Size: 4.0 Number of attempts: 1 Placement Confirmation: positive ETCO2 Tube secured with: Tape Dental Injury: Teeth and Oropharynx as per pre-operative assessment  Comments: Smooth induction Tonya Thompson-- LMA insertion initially by CRNA-- adjusted by Tonya Thompson-- bilat BS -- teeth as preop

## 2019-05-11 ENCOUNTER — Encounter: Payer: Self-pay | Admitting: *Deleted

## 2019-05-11 DIAGNOSIS — I129 Hypertensive chronic kidney disease with stage 1 through stage 4 chronic kidney disease, or unspecified chronic kidney disease: Secondary | ICD-10-CM | POA: Diagnosis not present

## 2019-05-11 DIAGNOSIS — I69351 Hemiplegia and hemiparesis following cerebral infarction affecting right dominant side: Secondary | ICD-10-CM | POA: Diagnosis not present

## 2019-05-11 DIAGNOSIS — E119 Type 2 diabetes mellitus without complications: Secondary | ICD-10-CM | POA: Diagnosis not present

## 2019-05-11 DIAGNOSIS — N132 Hydronephrosis with renal and ureteral calculous obstruction: Secondary | ICD-10-CM | POA: Diagnosis not present

## 2019-05-11 DIAGNOSIS — G40219 Localization-related (focal) (partial) symptomatic epilepsy and epileptic syndromes with complex partial seizures, intractable, without status epilepticus: Secondary | ICD-10-CM | POA: Diagnosis not present

## 2019-05-11 DIAGNOSIS — N184 Chronic kidney disease, stage 4 (severe): Secondary | ICD-10-CM | POA: Diagnosis not present

## 2019-05-11 LAB — BASIC METABOLIC PANEL
Anion gap: 8 (ref 5–15)
BUN: 39 mg/dL — ABNORMAL HIGH (ref 8–23)
CO2: 22 mmol/L (ref 22–32)
Calcium: 8.9 mg/dL (ref 8.9–10.3)
Chloride: 112 mmol/L — ABNORMAL HIGH (ref 98–111)
Creatinine, Ser: 1.73 mg/dL — ABNORMAL HIGH (ref 0.44–1.00)
GFR calc Af Amer: 36 mL/min — ABNORMAL LOW (ref >60)
GFR calc non Af Amer: 31 mL/min — ABNORMAL LOW (ref >60)
Glucose, Bld: 95 mg/dL (ref 70–99)
Potassium: 3.8 mmol/L (ref 3.5–5.1)
Sodium: 142 mmol/L (ref 135–145)

## 2019-05-11 NOTE — Progress Notes (Signed)
Patient ID: Tonya Thompson, female   DOB: 1955-11-07, 64 y.o.   MRN: [id number]  1 Day Post-Op Subjective: Pt doing well.  No pain or other complaints.  Objective: Vital signs in last 24 hours: Temp:  [97.5 F (36.4 C)-98.2 F (36.8 C)] 97.5 F (36.4 C) (01/08 0500) Pulse Rate:  [62-84] 65 (01/08 0500) Resp:  [8-18] 16 (01/08 0500) BP: (106-148)/(68-95) 116/75 (01/08 0500) SpO2:  [100 %] 100 % (01/08 0500) Weight:  [66.4 kg] 66.4 kg (01/07 1401)  Intake/Output from previous day: 01/07 0701 - 01/08 0700 In: 1000 [P.O.:100; I.V.:800; IV Piggyback:100] Out: 0  Intake/Output this shift: No intake/output data recorded.  Physical Exam:  General: Alert and oriented Abdomen: Soft, ND   Lab Results: Recent Labs    05/08/19 1058  HGB 12.7  HCT 38.8   BMET Recent Labs    05/08/19 1058 05/11/19 0425  NA 140 142  K 3.8 3.8  CL 107 112*  CO2 27 22  GLUCOSE 96 95  BUN 39* 39*  CREATININE 1.78* 1.73*  CALCIUM 9.7 8.9     Studies/Results: DG C-Arm 1-60 Min-No Report  Result Date: 05/10/2019 Fluoroscopy was utilized by the requesting physician.  No radiographic interpretation.    Assessment/Plan: D/C home   LOS: 0 days   Dutch Gray 05/11/2019, 7:05 AM

## 2019-05-11 NOTE — Progress Notes (Signed)
Pt to be discharged to home this afternoon. Pt given discharge instructions including Medications and schedules for these Medications. Pt verbalized understanding of all discharge instructions.

## 2019-05-11 NOTE — Anesthesia Postprocedure Evaluation (Signed)
Anesthesia Post Note  Patient: Samariya Rockhold Nembhard  Procedure(s) Performed: CYSTOSCOPY/RETROGRADE/URETEROSCOPY/HOLMIUM LASER/STENT PLACEMENT (Left )     Patient location during evaluation: PACU Anesthesia Type: General Level of consciousness: awake and alert Pain management: pain level controlled Vital Signs Assessment: post-procedure vital signs reviewed and stable Respiratory status: spontaneous breathing, nonlabored ventilation, respiratory function stable and patient connected to nasal cannula oxygen Cardiovascular status: blood pressure returned to baseline and stable Postop Assessment: no apparent nausea or vomiting Anesthetic complications: no    Last Vitals:  Vitals:   05/10/19 2106 05/11/19 0500  BP: 106/71 116/75  Pulse: 63 65  Resp: 18 16  Temp: 36.6 C (!) 36.4 C  SpO2: 100% 100%    Last Pain:  Vitals:   05/11/19 0500  TempSrc: Oral  PainSc:                  Effie Berkshire

## 2019-05-11 NOTE — Discharge Summary (Signed)
  Date of admission: 05/10/2019  Date of discharge: 05/11/2019  Admission diagnosis: Left ureteral and renal calculi and AKI  Discharge diagnosis: Left ureteral and renal calculi and AKI  Secondary diagnoses: History of AVM s/p stroke, DM.  History and Physical: For full details, please see admission history and physical. Briefly, Tonya Thompson is a 64 y.o. year old patient with a large 2 cm left UVJ calculus and AKI.   Hospital Course: She underwent left ureteroscopic laser lithotripsy and stone removal and left ureteral stent.  Preoperative Cr was 1.7.  She was observed overnight due to the fact she lives at home alone.  She was stable the morning after surgery and was discharged home.  Laboratory values:  Recent Labs    05/08/19 1058  HGB 12.7  HCT 38.8   Recent Labs    05/08/19 1058 05/11/19 0425  CREATININE 1.78* 1.73*    Disposition: Home  Discharge instruction: The patient was instructed to be ambulatory but told to refrain from heavy lifting, strenuous activity, or driving.  Discharge medications:  Allergies as of 05/11/2019      Reactions   Aspirin Other (See Comments)   CONTRAINDICATED DUE TO HISTORY OF BLEEDING IN BRAIN   Cheese    Cant take due to history of severe migraines   Chocolate    Cant take due to history of severe migraines   Coffee Flavor    Cant take due to history of severe migraines   Other     Nuts - Cant take due to history of severe migraines   Red Wine Complex [germanium]    Cant take due to history of severe migraines      Medication List    TAKE these medications   baclofen 10 MG tablet Commonly known as: LIORESAL Take 10 mg by mouth at bedtime.   butalbital-acetaminophen-caffeine 50-325-40 MG tablet Commonly known as: FIORICET Take 1 tablet by mouth 2 (two) times daily as needed for headache or migraine.   CALCIUM + D PO Take 1 tablet by mouth 2 (two) times daily.   calcium carbonate 500 MG chewable tablet Commonly known  as: TUMS - dosed in mg elemental calcium Chew 1 tablet by mouth daily.   fexofenadine 180 MG tablet Commonly known as: ALLEGRA Take 180 mg by mouth daily as needed for allergies.   FISH OIL PO Take 1 capsule by mouth daily.   multivitamin with minerals tablet Take 1 tablet by mouth daily.   POTASSIUM PO Take 1 tablet by mouth daily.   Systane Balance 0.6 % Soln Generic drug: Propylene Glycol Place 1 drop into both eyes 2 (two) times daily.   topiramate 200 MG tablet Commonly known as: TOPAMAX Takes 1 &1/2 tabs 2 x /day    (3 tabs /day) What changed:   how much to take  how to take this  when to take this  additional instructions   traMADol 50 MG tablet Commonly known as: ULTRAM Take 1-2 tablets (50-100 mg total) by mouth every 6 (six) hours as needed (pain).   Vitamin D3 25 MCG tablet Commonly known as: Vitamin D Take 1,000 Units by mouth 2 (two) times daily.       Followup:  Follow-up Information    Raynelle Bring, MD.   Specialty: Urology Why: 05/30/19 at 12:45 PM Contact information: [place] [address] [phone]

## 2019-05-11 NOTE — Progress Notes (Signed)
Nutrition Brief Note  Patient identified on the Malnutrition Screening Tool (MST) Report  Wt Readings from Last 15 Encounters:  05/10/19 66.4 kg  05/08/19 66.4 kg  03/16/19 67 kg  03/01/19 68 kg  02/08/19 69.9 kg  11/28/18 68.8 kg  08/22/18 75.7 kg  05/23/18 68 kg  02/16/18 67.1 kg  12/06/17 67.1 kg  11/10/17 63.5 kg  06/08/17 67.6 kg  06/01/17 67.6 kg  05/05/17 67.6 kg  03/03/17 68.5 kg    Body mass index is 23.61 kg/m. Patient meets criteria for normal weight based on current BMI. Current weight is 146 lb, weight on 03/16/19 was 147 lb, and weight on 02/08/19 was 154 lb. This indicates 8 lb weight loss (5.2% body weight) in the past 3 months; not significant for time frame.   Current diet order is Regular and she ate 100% of breakfast this AM. Labs and medications reviewed.   Discharge order and discharge summary for discharge to home entered earlier this AM. Patient is POD #1 L ureteroscopic laser lithotripsy, stone removal, and L ureteral stent placement.   No nutrition interventions warranted at this time. If nutrition issues arise, please consult RD.     Jarome Matin, MS, RD, LDN, [facility] Inpatient Clinical Dietitian Pager # [phone] After hours/weekend pager # [phone]

## 2019-05-23 ENCOUNTER — Encounter: Payer: Self-pay | Admitting: Internal Medicine

## 2019-05-25 ENCOUNTER — Other Ambulatory Visit: Payer: Self-pay | Admitting: Internal Medicine

## 2019-05-25 DIAGNOSIS — Z1231 Encounter for screening mammogram for malignant neoplasm of breast: Secondary | ICD-10-CM

## 2019-05-30 DIAGNOSIS — N201 Calculus of ureter: Secondary | ICD-10-CM | POA: Diagnosis not present

## 2019-05-31 NOTE — Progress Notes (Signed)
MEDICARE ANNUAL WELLNESS VISIT AND FOLLOW UP  Assessment:   Encounter for Medicare annual wellness exam 1 year  Epilepsy, grand mal ([place]) Followed by Dr. Marlou Sa  Partial symptomatic epilepsy with complex partial seizures, intractable, without status epilepticus ([place]) Followed by Dr. Marlou Sa  Right hemiparesis [facility]) Continue with cane, PT as needed  CKD (chronic kidney disease) stage 4, GFR 15-29 ml/min (HCC) -     COMPLETE METABOLIC PANEL WITH GFR Increase fluids, follow up urology and nephrology  Labile hypertension -     CBC with Differential/Platelet -     COMPLETE METABOLIC PANEL WITH GFR -     TSH - continue medications, DASH diet, exercise and monitor at home. Call if greater than 130/80.   Abnormal glucose Discussed disease progression and risks Discussed diet/exercise, weight management and risk modification  Hyperlipidemia, mixed -     Lipid panel check lipids decrease fatty foods increase activity.   Headache, unspecified headache type Monitor  Vitamin D deficiency Continue supplement  Medication management -     Magnesium  Insulin resistance Discussed disease progression and risks Discussed diet/exercise, weight management and risk modification  Osteopenia, unspecified location Osteopenia- get dexa, continue Vit D and Ca, weight bearing exercises  Ureteral stone -     mirabegron ER (MYRBETRIQ) 50 MG TB24 tablet; Take 1 tablet (50 mg total) by mouth daily.   Over 40 minutes of exam, counseling, chart review and critical decision making was performed Future Appointments  Date Time Provider [place]  07/03/2019 12:00 PM [facility] MM 2 [facility]  08/28/2019 10:00 AM Liane Comber, NP [facility] None     Plan:   During the course of the visit the patient was educated and counseled about appropriate screening and preventive services including:    Pneumococcal vaccine   Prevnar 13  Influenza vaccine  Td vaccine  Screening  electrocardiogram  Bone densitometry screening  Colorectal cancer screening  Diabetes screening  Glaucoma screening  Nutrition counseling   Advanced directives: requested   Subjective:  Tonya Thompson is a 64 y.o. female who presents for Medicare Annual Wellness Visit and 3 month follow up.   Follows with Dr. Duwayne Heck for history of seizure disorder predating to age 32 yo and Brain AVM at age 73 yo with a 1st stroke at age 78 (16)  And 3 more strokes by age 37 yo (22) when she had he 1st surg for AVM. Patient has residual R hemiparesis. Her last seizure reportedly was circa 1988. She is on topamax.    She moved into an apartment at [address], last year in June 2019, states her sister is trying to get her moved into friends home, where her father, uncle and cousin are there. She fell in the tub once, she walks with a cane, she does her own mediations, no issues with foods. She does not drive, currently her church and friends take her places.   She saw nephrology in Dec for worsening Kidney function, went from GFR 56 in 08/2018 to GFR of 26/27 in 02/2019, had US renal and CT AB, had stone removal with [facility] Jan 7th.   Lab Results  Component Value Date   GFRNONAA 31 (L) 05/11/2019    BMI is Body mass index is 22.84 kg/m., she has not been working on diet and exercise, but plans to restart a stretching/muscle training regimen.  Wt Readings from Last 3 Encounters:  06/01/19 145 lb 12.8 oz (66.1 kg)  05/10/19 146  lb 4.8 oz (66.4 kg)  05/08/19 146 lb 4.8 oz (66.4 kg)   Her blood pressure has not been controlled at home, today their BP is BP: 122/88  She does not workout. She denies chest pain, shortness of breath, dizziness.   She is not on cholesterol medication, taking omega 3 supplement only and denies myalgias. Her cholesterol is not at goal. The cholesterol last visit was:   Lab Results  Component Value Date   CHOL 177 03/01/2019   HDL 37 (L)  03/01/2019   LDLCALC 106 (H) 03/01/2019   TRIG 227 (H) 03/01/2019   CHOLHDL 4.8 03/01/2019    She has been working on diet and exercise for glucose management, and denies polydipsia and polyuria. Last A1C in the office was:  Lab Results  Component Value Date   HGBA1C 5.1 08/22/2018   Patient is on Vitamin D supplement.   Lab Results  Component Value Date   VD25OH 94 11/28/2018      Medication Review:    Current Outpatient Medications (Respiratory):  .  fexofenadine (ALLEGRA) 180 MG tablet, Take 180 mg by mouth daily as needed for allergies.   Current Outpatient Medications (Analgesics):  .  butalbital-acetaminophen-caffeine (FIORICET, ESGIC) 50-325-40 MG tablet, Take 1 tablet by mouth 2 (two) times daily as needed for headache or migraine.    Current Outpatient Medications (Other):  .  baclofen (LIORESAL) 10 MG tablet, Take 10 mg by mouth at bedtime.  .  calcium carbonate (TUMS - DOSED IN MG ELEMENTAL CALCIUM) 500 MG chewable tablet, Chew 1 tablet by mouth daily. .  Calcium Citrate-Vitamin D (CALCIUM + D PO), Take 1 tablet by mouth 2 (two) times daily. .  Multiple Vitamins-Minerals (MULTIVITAMIN WITH MINERALS) tablet, Take 1 tablet by mouth daily.  .  Omega-3 Fatty Acids (FISH OIL PO), Take 1 capsule by mouth daily. Marland Kitchen  POTASSIUM PO, Take 1 tablet by mouth daily. Marland Kitchen  Propylene Glycol (SYSTANE BALANCE) 0.6 % SOLN, Place 1 drop into both eyes 2 (two) times daily. Marland Kitchen  topiramate (TOPAMAX) 200 MG tablet, Takes 1 &1/2 tabs 2 x /day    (3 tabs /day) (Patient taking differently: Take 300 mg by mouth 2 (two) times daily. MD wants her to only take the American made Zy Topamax) .  Vitamin D3 (VITAMIN D) 25 MCG tablet, Take 1,000 Units by mouth 2 (two) times daily.    Allergies  Allergen Reactions  . Aspirin Other (See Comments)    CONTRAINDICATED DUE TO HISTORY OF BLEEDING IN BRAIN  . Cheese     Cant take due to history of severe migraines  . Chocolate     Cant take due to history of  severe migraines  . Coffee Flavor     Cant take due to history of severe migraines  . Other      Nuts - Cant take due to history of severe migraines  . Red Wine Complex [Germanium]     Cant take due to history of severe migraines    Current Problems (verified) Patient Active Problem List   Diagnosis Date Noted  . Ureteral stone 05/10/2019  . Insulin resistance 02/16/2018  . Medication management 02/16/2018  . Osteopenia 01/10/2018  . CKD (chronic kidney disease) stage 4, GFR 15-29 ml/min (HCC) 11/10/2017  . 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 Hyperlipidemia, mixed 06/01/2017  . Abnormal glucose 06/01/2017  . Vitamin D deficiency 06/01/2017  . Epilepsy, grand mal ([place]) 11/15/2016  . 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 Headache, unspecified headache type 04/14/2016  . Right hemiparesis ([place]) 12/29/2015    Screening Tests Immunization History  Administered Date(s) Administered  . Influenza Inj Mdck Quad Pf 02/16/2017  . Influenza Inj Mdck Quad With Preservative 02/16/2018, 03/01/2019  . Influenza,inj,Quad PF,6+ Mos 03/01/2017  . Influenza-Unspecified 03/01/2017  . Pneumococcal-Unspecified 05/03/2004  . Tdap 08/11/2009  . Zoster 04/14/2012   Preventative care: Last colonoscopy: [facility], 5 years Last mammogram: 06/2018 Last pap smear/pelvic exam: 12/06/2017, neg HPV- does not need more DEXA: 01/10/2018 - T -1.1 US renal 04/2019  Prior vaccinations: TD or Tdap: 2011 DUE Influenza: 2020  Pneumococcal: 2006 Prevnar13: due age 60 Shingles/Zostavax: 2013 Shingrix get other shot before age 10  Names of Other Physician/Practitioners you currently use: 1. [facility] here for primary care 2. Job Scott 12/2019 3. [place] 08/2019 Patient Care Team: Unk Pinto, MD as PCP - General (Internal Medicine)  SURGICAL HISTORY She  has a past surgical history that includes Brain  surgery (09/19/1978); Foot surgery (Right, 10/1985); Tubal ligation; and Cystoscopy/ureteroscopy/holmium laser/stent placement (Left, 05/10/2019). FAMILY HISTORY Her family history includes CAD in her maternal uncle; COPD in her maternal uncle; Cancer in her father; Dementia in her father and paternal uncle; Diabetes in her father; Heart attack in her maternal grandfather and mother; Heart disease in her mother; Heart failure in her mother; Hyperlipidemia in her father and paternal uncle; Hypertension in her paternal uncle; Stroke in her paternal grandmother. SOCIAL HISTORY She  reports that she has never smoked. She has never used smokeless tobacco. She reports that she does not drink alcohol or use drugs.   MEDICARE WELLNESS OBJECTIVES: Physical activity:   Cardiac risk factors:   Depression/mood screen:   Depression screen [facility] 2/9 08/22/2018  Decreased Interest 0  Down, Depressed, Hopeless 1  PHQ - 2 Score 1    ADLs:  In your present state of health, do you have any difficulty performing the following activities: 05/10/2019 05/10/2019  Hearing? - N  Vision? - N  Difficulty concentrating or making decisions? - N  Walking or climbing stairs? - N  Comment - -  Dressing or bathing? - N  Doing errands, shopping? Y -  Comment Cant drive, has friends support -  Some recent data might be hidden     Cognitive Testing  Alert? Yes  Normal Appearance?Yes  Oriented to person? Yes  Place? Yes   Time? Yes  Recall of three objects?  Yes  Can perform simple calculations? Yes  Displays appropriate judgment?Yes  Can read the correct time from a watch face?Yes  EOL planning:    Review of Systems  Constitutional: Negative for malaise/fatigue and weight loss.  HENT: Negative for hearing loss and tinnitus.   Eyes: Negative for blurred vision and double vision.  Respiratory: Negative for cough, sputum production, shortness of breath and wheezing.   Cardiovascular: Negative for chest pain,  palpitations, orthopnea, claudication, leg swelling and PND.  Gastrointestinal: Negative for abdominal pain, blood in stool, constipation, diarrhea, heartburn, melena, nausea and vomiting.  Genitourinary: Negative.   Musculoskeletal: Negative for falls, joint pain and myalgias.  Skin: Negative for rash.  Neurological: Negative for dizziness, tingling, sensory change, weakness and headaches.  Endo/Heme/Allergies: Negative for polydipsia.  Psychiatric/Behavioral: Negative.  Negative for depression, memory loss, substance abuse and suicidal ideas. The patient is not nervous/anxious and does not have insomnia.   All other systems reviewed and are negative.  SEE ABOVE  Objective:  Today's Vitals   06/01/19 1043  BP: 122/88  Pulse: 75  Temp: (!) 97.3 F (36.3 C)  SpO2: 99%  Weight: 145 lb 12.8 oz (66.1 kg)  Height: 5\' 7"  (1.702 m)   Body mass index is 22.84 kg/m.  General appearance: alert, no distress, WD/WN, female HEENT: normocephalic, sclerae anicteric, TMs pearly, nares patent, no discharge or erythema, pharynx normal Oral cavity: MMM, no lesions Neck: supple, no lymphadenopathy, no thyromegaly, no masses Heart: RRR, normal S1, S2, no murmurs Lungs: CTA bilaterally, no wheezes, rhonchi, or rales Abdomen: +bs, soft, non tender, non distended, no masses, no hepatomegaly, no splenomegaly Musculoskeletal: nontender, no swelling, no obvious deformity Extremities: no edema, no cyanosis, no clubbing Pulses: 2+ symmetric, upper and lower extremities, normal cap refill Neurological: alert, oriented x 3, CN2-12 intact, strength normal upper extremities and lower extremities left side, right side with decreased strength and spastic right hip and right hand with right ankle in brace, no cerebellar signs, gait abnormal, patient walks with cane Psychiatric: flat affect, behavior normal, pleasant   Medicare Attestation I have personally reviewed: The patient's medical and social  history Their use of alcohol, tobacco or illicit drugs Their current medications and supplements The patient's functional ability including ADLs,fall risks, home safety risks, cognitive, and hearing and visual impairment Diet and physical activities Evidence for depression or mood disorders  The patient's weight, height, BMI, and visual acuity have been recorded in the chart.  I have made referrals, counseling, and provided education to the patient based on review of the above and I have provided the patient with a written personalized care plan for preventive services.     Vicie Mutters, PA-C   06/01/2019

## 2019-06-01 ENCOUNTER — Ambulatory Visit (INDEPENDENT_AMBULATORY_CARE_PROVIDER_SITE_OTHER): Payer: Medicare Other | Admitting: Physician Assistant

## 2019-06-01 ENCOUNTER — Ambulatory Visit: Payer: Self-pay | Admitting: Adult Health

## 2019-06-01 ENCOUNTER — Encounter: Payer: Self-pay | Admitting: Physician Assistant

## 2019-06-01 ENCOUNTER — Other Ambulatory Visit: Payer: Self-pay

## 2019-06-01 VITALS — BP 122/88 | HR 75 | Temp 97.3°F | Ht 67.0 in | Wt 145.8 lb

## 2019-06-01 DIAGNOSIS — E782 Mixed hyperlipidemia: Secondary | ICD-10-CM | POA: Diagnosis not present

## 2019-06-01 DIAGNOSIS — G40219 Localization-related (focal) (partial) symptomatic epilepsy and epileptic syndromes with complex partial seizures, intractable, without status epilepticus: Secondary | ICD-10-CM

## 2019-06-01 DIAGNOSIS — M8580 Other specified disorders of bone density and structure, unspecified site: Secondary | ICD-10-CM

## 2019-06-01 DIAGNOSIS — Z79899 Other long term (current) drug therapy: Secondary | ICD-10-CM

## 2019-06-01 DIAGNOSIS — R519 Headache, unspecified: Secondary | ICD-10-CM | POA: Diagnosis not present

## 2019-06-01 DIAGNOSIS — E559 Vitamin D deficiency, unspecified: Secondary | ICD-10-CM

## 2019-06-01 DIAGNOSIS — R6889 Other general symptoms and signs: Secondary | ICD-10-CM | POA: Diagnosis not present

## 2019-06-01 DIAGNOSIS — R7309 Other abnormal glucose: Secondary | ICD-10-CM | POA: Diagnosis not present

## 2019-06-01 DIAGNOSIS — Z0000 Encounter for general adult medical examination without abnormal findings: Secondary | ICD-10-CM

## 2019-06-01 DIAGNOSIS — G40409 Other generalized epilepsy and epileptic syndromes, not intractable, without status epilepticus: Secondary | ICD-10-CM

## 2019-06-01 DIAGNOSIS — N201 Calculus of ureter: Secondary | ICD-10-CM | POA: Diagnosis not present

## 2019-06-01 DIAGNOSIS — E8881 Metabolic syndrome: Secondary | ICD-10-CM

## 2019-06-01 DIAGNOSIS — R0989 Other specified symptoms and signs involving the circulatory and respiratory systems: Secondary | ICD-10-CM | POA: Diagnosis not present

## 2019-06-01 DIAGNOSIS — Z0001 Encounter for general adult medical examination with abnormal findings: Secondary | ICD-10-CM | POA: Diagnosis not present

## 2019-06-01 DIAGNOSIS — N184 Chronic kidney disease, stage 4 (severe): Secondary | ICD-10-CM

## 2019-06-01 DIAGNOSIS — G8191 Hemiplegia, unspecified affecting right dominant side: Secondary | ICD-10-CM

## 2019-06-01 DIAGNOSIS — G40309 Generalized idiopathic epilepsy and epileptic syndromes, not intractable, without status epilepticus: Secondary | ICD-10-CM

## 2019-06-01 LAB — MAGNESIUM: Magnesium: 2.1 mg/dL (ref 1.5–2.5)

## 2019-06-01 LAB — CBC WITH DIFFERENTIAL/PLATELET
Absolute Monocytes: 452 cells/uL (ref 200–950)
Basophils Absolute: 42 cells/uL (ref 0–200)
Basophils Relative: 0.8 %
Eosinophils Absolute: 99 cells/uL (ref 15–500)
Eosinophils Relative: 1.9 %
HCT: 36.6 % (ref 35.0–45.0)
Hemoglobin: 12.3 g/dL (ref 11.7–15.5)
Lymphs Abs: 1498 cells/uL (ref 850–3900)
MCH: 31.9 pg (ref 27.0–33.0)
MCHC: 33.6 g/dL (ref 32.0–36.0)
MCV: 94.8 fL (ref 80.0–100.0)
MPV: 11 fL (ref 7.5–12.5)
Monocytes Relative: 8.7 %
Neutro Abs: 3110 cells/uL (ref 1500–7800)
Neutrophils Relative %: 59.8 %
Platelets: 245 10*3/uL (ref 140–400)
RBC: 3.86 10*6/uL (ref 3.80–5.10)
RDW: 11.4 % (ref 11.0–15.0)
Total Lymphocyte: 28.8 %
WBC: 5.2 10*3/uL (ref 3.8–10.8)

## 2019-06-01 LAB — COMPLETE METABOLIC PANEL WITH GFR
AG Ratio: 1.6 (calc) (ref 1.0–2.5)
ALT: 15 U/L (ref 6–29)
AST: 17 U/L (ref 10–35)
Albumin: 4.2 g/dL (ref 3.6–5.1)
Alkaline phosphatase (APISO): 96 U/L (ref 37–153)
BUN/Creatinine Ratio: 25 (calc) — ABNORMAL HIGH (ref 6–22)
BUN: 37 mg/dL — ABNORMAL HIGH (ref 7–25)
CO2: 26 mmol/L (ref 20–32)
Calcium: 9.9 mg/dL (ref 8.6–10.4)
Chloride: 107 mmol/L (ref 98–110)
Creat: 1.47 mg/dL — ABNORMAL HIGH (ref 0.50–0.99)
GFR, Est African American: 44 mL/min/{1.73_m2} — ABNORMAL LOW (ref >=60)
GFR, Est Non African American: 38 mL/min/{1.73_m2} — ABNORMAL LOW (ref >=60)
Globulin: 2.7 g/dL (calc) (ref 1.9–3.7)
Glucose, Bld: 85 mg/dL (ref 65–99)
Potassium: 3.8 mmol/L (ref 3.5–5.3)
Sodium: 140 mmol/L (ref 135–146)
Total Bilirubin: 0.2 mg/dL (ref 0.2–1.2)
Total Protein: 6.9 g/dL (ref 6.1–8.1)

## 2019-06-01 LAB — LIPID PANEL
Cholesterol: 190 mg/dL (ref <200)
HDL: 45 mg/dL — ABNORMAL LOW (ref >=50)
LDL Cholesterol (Calc): 116 mg/dL (calc) — ABNORMAL HIGH
Non-HDL Cholesterol (Calc): 145 mg/dL (calc) — ABNORMAL HIGH (ref <130)
Total CHOL/HDL Ratio: 4.2 (calc) (ref <5.0)
Triglycerides: 170 mg/dL — ABNORMAL HIGH (ref <150)

## 2019-06-01 LAB — TSH: TSH: 2.31 mIU/L (ref 0.40–4.50)

## 2019-06-01 MED ORDER — MIRABEGRON ER 50 MG PO TB24: 50.0000 mg | ORAL_TABLET | Freq: Every day | ORAL | 5 refills | Status: DC

## 2019-06-01 NOTE — Patient Instructions (Addendum)
Try the 25mg  myrbetriq and see how this does  Mirabegron extended-release tablets What is this medicine? MIRABEGRON (MIR a BEG ron) is used to treat overactive bladder. This medicine reduces the amount of bathroom visits. It may also help to control wetting accidents. It may be used alone, but sometimes may be given with other treatments. This medicine may be used for other purposes; ask your health care provider or pharmacist if you have questions. COMMON BRAND NAME(S): Myrbetriq What should I tell my health care provider before I take this medicine? They need to know if you have any of these conditions:  high blood pressure  kidney disease  liver disease  problems urinating  prostate disease  an unusual or allergic reaction to mirabegron, other medicines, foods, dyes, or preservatives  pregnant or trying to get pregnant  breast-feeding How should I use this medicine? Take this medicine by mouth with a glass of water. Follow the directions on the prescription label. Do not cut, crush or chew this medicine. You can take it with or without food. If it upsets your stomach, take it with food. Take your medicine at regular intervals. Do not take it more often than directed. Do not stop taking except on your doctor's advice. Talk to your pediatrician regarding the use of this medicine in children. Special care may be needed. Overdosage: If you think you have taken too much of this medicine contact a poison control center or emergency room at once. NOTE: This medicine is only for you. Do not share this medicine with others. What if I miss a dose? If you miss a dose, take it as soon as you can. If it is almost time for your next dose, take only that dose. Do not take double or extra doses. What may interact with this medicine?  codeine  desipramine  digoxin  flecainide  MAOIs like Carbex, Eldepryl, Marplan, Nardil, and  Parnate  methadone  metoprolol  pimozide  propafenone  thioridazine  warfarin This list may not describe all possible interactions. Give your health care provider a list of all the medicines, herbs, non-prescription drugs, or dietary supplements you use. Also tell them if you smoke, drink alcohol, or use illegal drugs. Some items may interact with your medicine. What should I watch for while using this medicine? Visit your doctor or health care professional for regular checks on your progress. Check your blood pressure as directed. Ask your doctor or health care professional what your blood pressure should be and when you should contact him or her. You may need to limit your intake of tea, coffee, caffeinated sodas, or alcohol. These drinks may make your symptoms worse. What side effects may I notice from receiving this medicine? Side effects that you should report to your doctor or health care professional as soon as possible:  allergic reactions like skin rash, itching or hives, swelling of the face, lips, or tongue  high blood pressure  fast, irregular heartbeat  redness, blistering, peeling or loosening of the skin, including inside the mouth  signs of infection like fever or chills; pain or difficulty passing urine  trouble passing urine or change in the amount of urine Side effects that usually do not require medical attention (report to your doctor or health care professional if they continue or are bothersome):  constipation  dry mouth  headache  runny nose  stomach upset This list may not describe all possible side effects. Call your doctor for medical advice about side effects. You  may report side effects to FDA at 1-800-FDA-1088. Where should I keep my medicine? Keep out of the reach of children. Store at room temperature between 15 and 30 degrees C (59 and 86 degrees F). Throw away any unused medicine after the expiration date. NOTE: This sheet is a summary.  It may not cover all possible information. If you have questions about this medicine, talk to your doctor, pharmacist, or health care provider.  2020 Elsevier/Gold Standard (2016-09-09 11:33:21)

## 2019-06-12 ENCOUNTER — Encounter: Payer: Self-pay | Admitting: Internal Medicine

## 2019-06-26 DIAGNOSIS — L821 Other seborrheic keratosis: Secondary | ICD-10-CM | POA: Diagnosis not present

## 2019-06-26 DIAGNOSIS — D2271 Melanocytic nevi of right lower limb, including hip: Secondary | ICD-10-CM | POA: Diagnosis not present

## 2019-06-26 DIAGNOSIS — D225 Melanocytic nevi of trunk: Secondary | ICD-10-CM | POA: Diagnosis not present

## 2019-06-26 DIAGNOSIS — D1801 Hemangioma of skin and subcutaneous tissue: Secondary | ICD-10-CM | POA: Diagnosis not present

## 2019-06-26 DIAGNOSIS — L720 Epidermal cyst: Secondary | ICD-10-CM | POA: Diagnosis not present

## 2019-06-26 DIAGNOSIS — D2272 Melanocytic nevi of left lower limb, including hip: Secondary | ICD-10-CM | POA: Diagnosis not present

## 2019-06-26 DIAGNOSIS — D2261 Melanocytic nevi of right upper limb, including shoulder: Secondary | ICD-10-CM | POA: Diagnosis not present

## 2019-06-26 DIAGNOSIS — D2262 Melanocytic nevi of left upper limb, including shoulder: Secondary | ICD-10-CM | POA: Diagnosis not present

## 2019-07-03 ENCOUNTER — Other Ambulatory Visit: Payer: Self-pay

## 2019-07-03 ENCOUNTER — Ambulatory Visit
Admission: RE | Admit: 2019-07-03 | Discharge: 2019-07-03 | Disposition: A | Discharge status: Home / Self Care | Payer: Medicare Other | Source: Ambulatory Visit | Attending: Internal Medicine | Admitting: Internal Medicine

## 2019-07-03 DIAGNOSIS — Z1231 Encounter for screening mammogram for malignant neoplasm of breast: Secondary | ICD-10-CM | POA: Diagnosis not present

## 2019-07-03 IMAGING — MG DIGITAL SCREENING BILAT W/ TOMO W/ CAD
8 series · 9 of 24 positions shown · non-contrast
Comparison: Previous exam(s).

CLINICAL DATA: Screening.

EXAM:
DIGITAL SCREENING BILATERAL MAMMOGRAM WITH TOMO AND CAD

[L CC synth-2D]
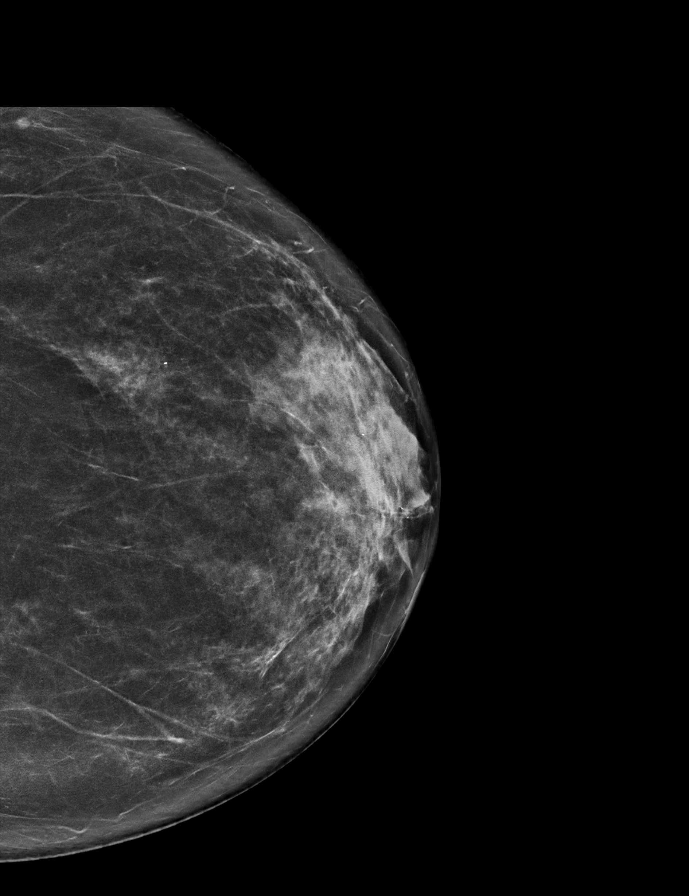

[L MLO synth-2D]
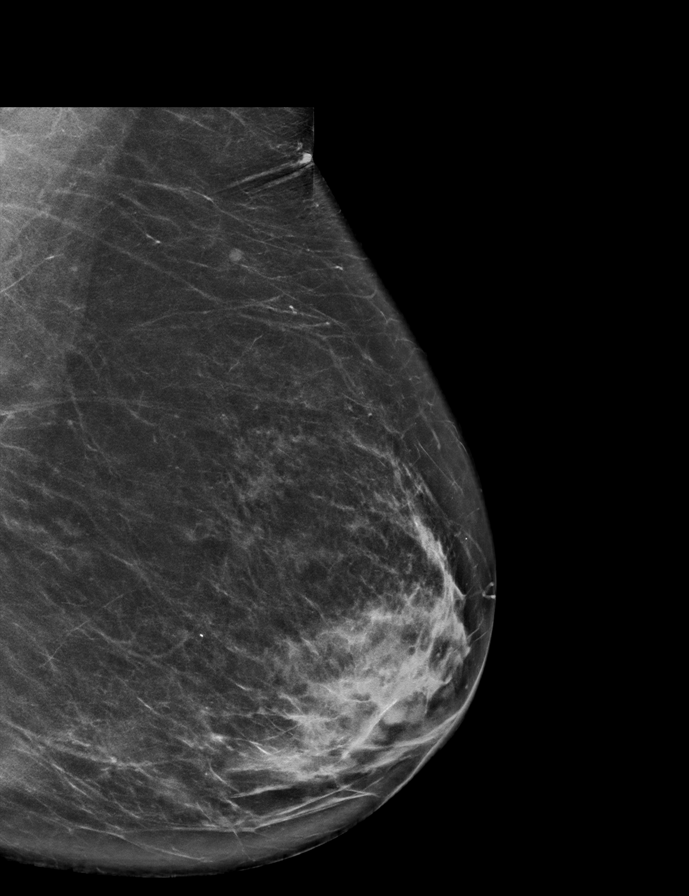

[R MLO synth-2D]
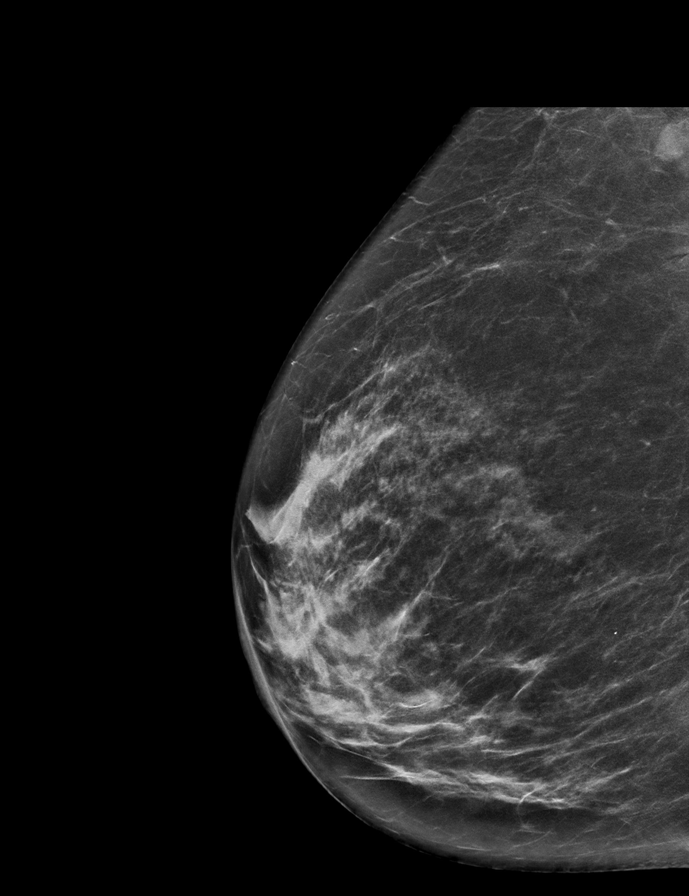

[R CC synth-2D]
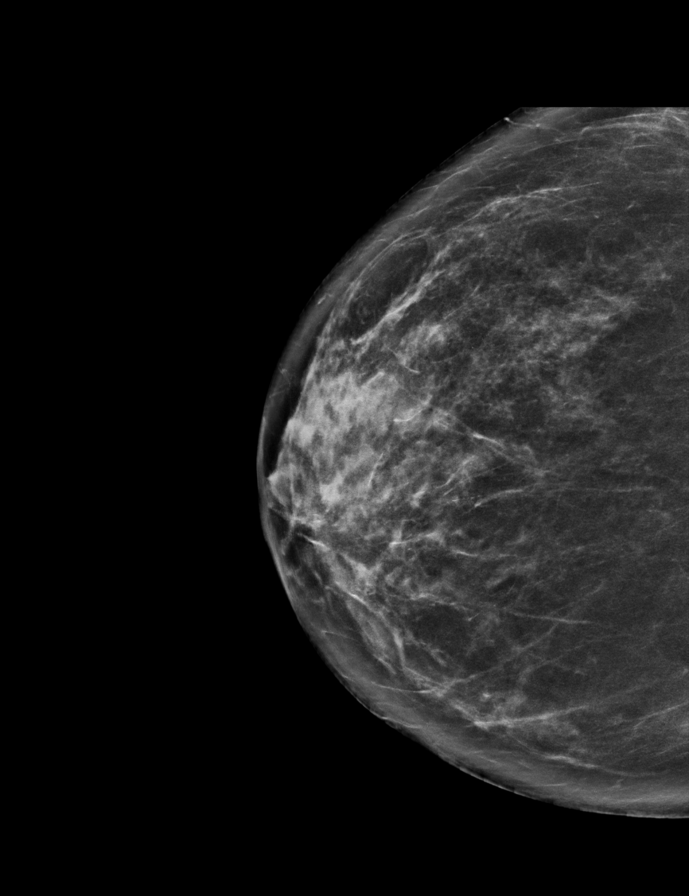

[L CC tomo · 2 of 64 frames shown]
[frame 21/64]
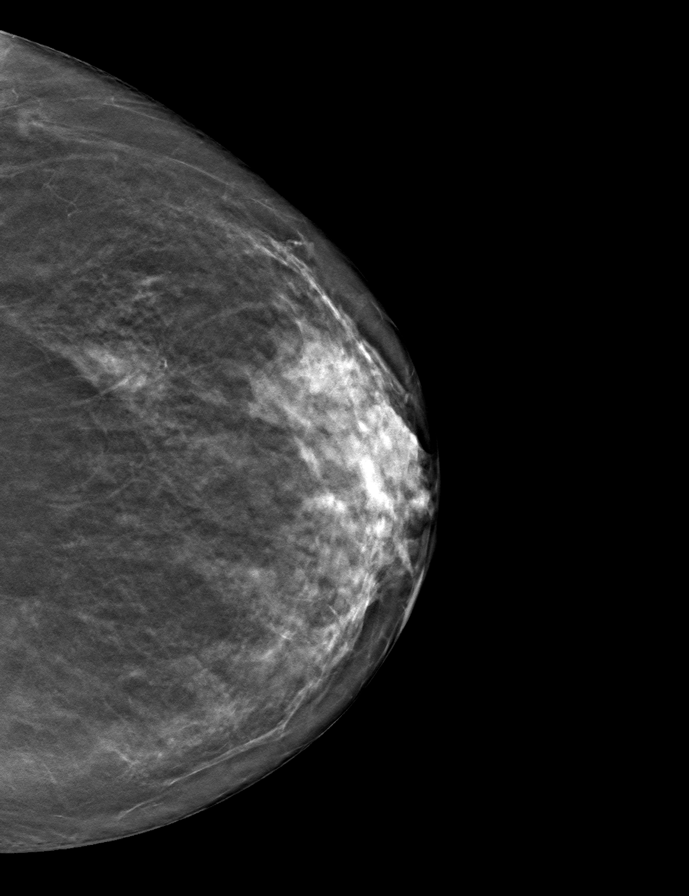
[frame 33/64]
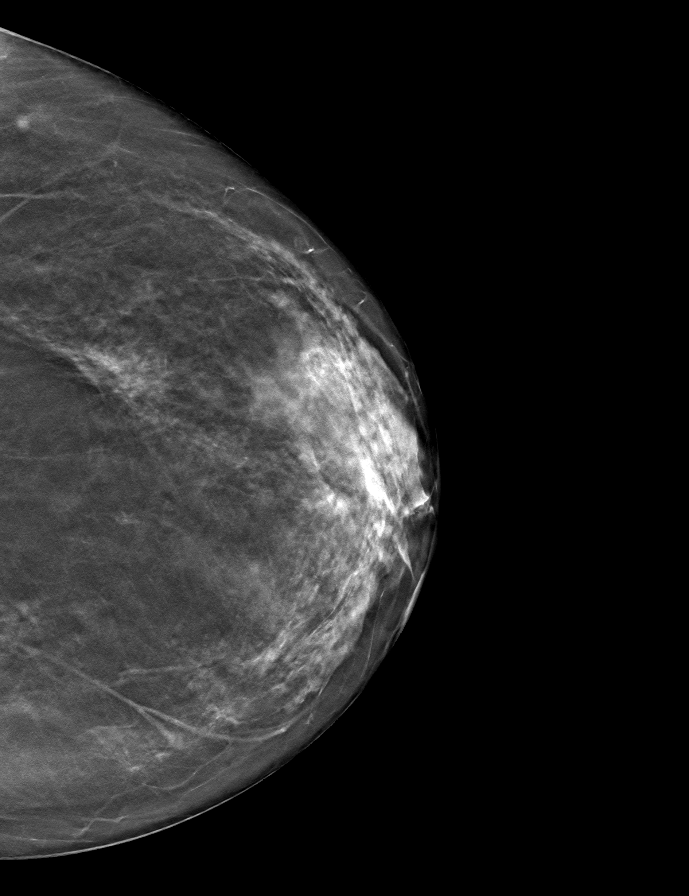

[R MLO tomo · tomo slice 36/71.0]
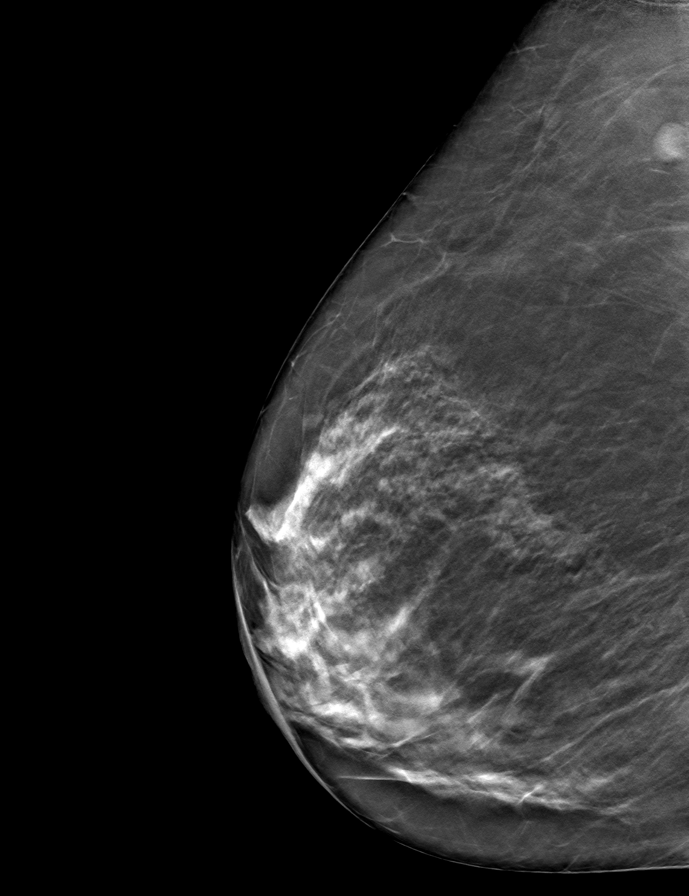

[L MLO tomo · tomo slice 38/75.0]
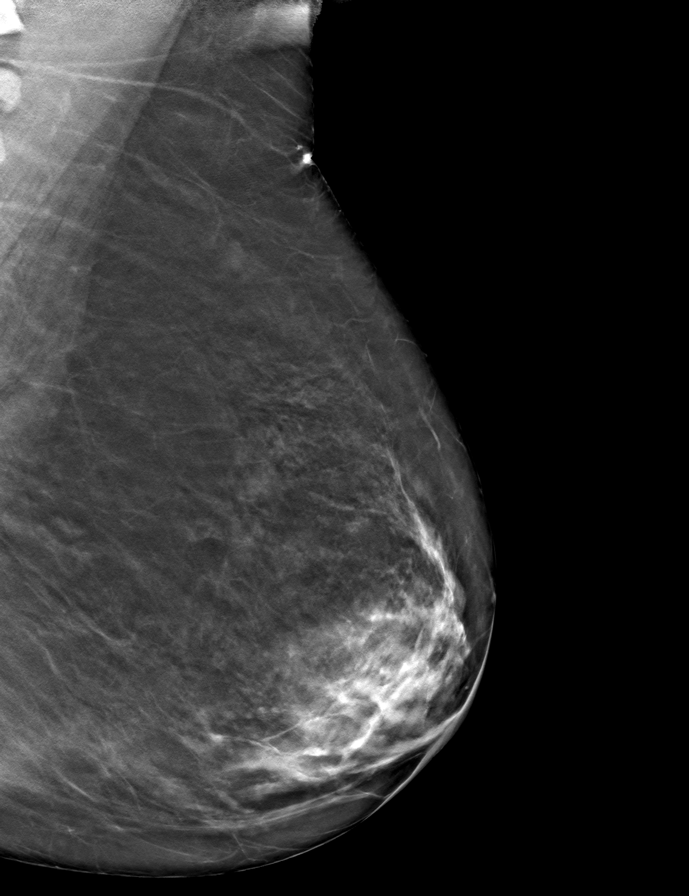

[R CC tomo · tomo slice 35/68.0]
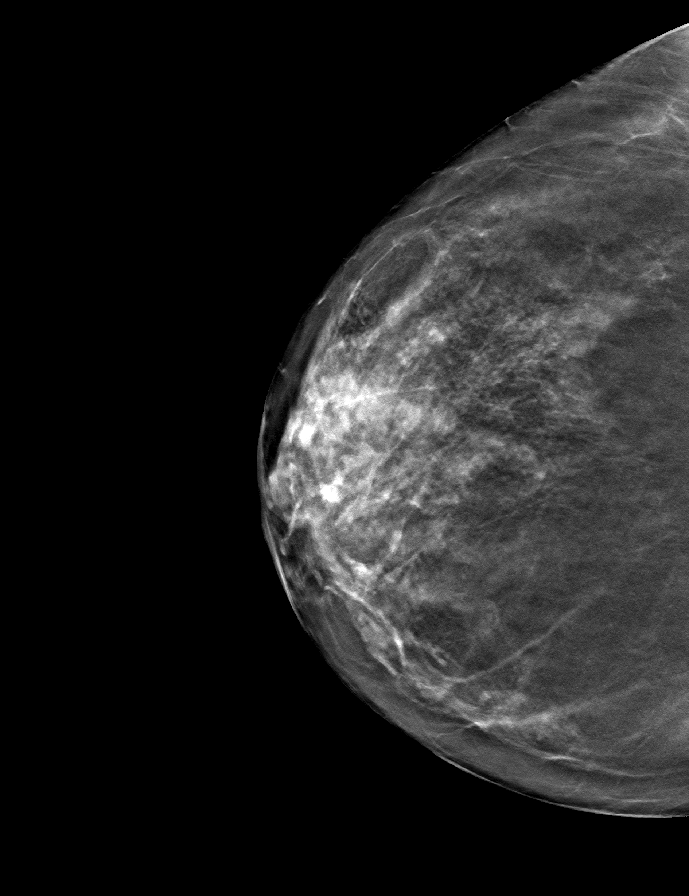

[9 of 24 positions shown; findings below may reference images not displayed]

ACR Breast Density Category c: The breast tissue is heterogeneously
dense, which may obscure small masses.
FINDINGS: There are no findings suspicious for malignancy. Images were
processed with CAD.
IMPRESSION: No mammographic evidence of malignancy. A result letter of this
screening mammogram will be mailed directly to the patient.

RECOMMENDATION:
Screening mammogram in one year. (Code:[5V])

BI-RADS CATEGORY  1: Negative.

## 2019-08-14 DIAGNOSIS — N1832 Chronic kidney disease, stage 3b: Secondary | ICD-10-CM | POA: Diagnosis not present

## 2019-08-14 DIAGNOSIS — I639 Cerebral infarction, unspecified: Secondary | ICD-10-CM | POA: Diagnosis not present

## 2019-08-14 DIAGNOSIS — I129 Hypertensive chronic kidney disease with stage 1 through stage 4 chronic kidney disease, or unspecified chronic kidney disease: Secondary | ICD-10-CM | POA: Diagnosis not present

## 2019-08-28 ENCOUNTER — Encounter: Payer: Medicare Other | Admitting: Adult Health

## 2019-09-03 ENCOUNTER — Encounter: Payer: Self-pay | Admitting: Adult Health

## 2019-09-03 DIAGNOSIS — N179 Acute kidney failure, unspecified: Secondary | ICD-10-CM

## 2019-09-03 HISTORY — DX: Acute kidney failure, unspecified: N17.9

## 2019-09-03 NOTE — Progress Notes (Signed)
Complete Physical  Assessment and Plan:   Encounter for routine adult health examination without abnormal findings  Labile hypertension Typically fairly controlled off of medications Monitor blood pressure at home; call if consistently over 130/80 Continue DASH diet.   Reminder to go to the ER if any CP, SOB, nausea, dizziness, severe HA, changes vision/speech, left arm numbness and tingling and jaw pain.      EKG 12-Lead  Partial symptomatic epilepsy with complex partial seizures, intractable, without status epilepticus ([place]) Followed by Dr. Loretta Plume, last seizure remote,   Vitamin D deficiency -     Check vitamin D at next visit due to medicare CPE  Hyperlipidemia, mixed Continue low cholesterol diet and exercise.  Check lipid panel.  -     Lipid panel -     TSH  Abnormal glucose -     Hemoglobin A1c  BMI 25, adult  Medication management -     CBC with Differential/Platelet -     COMPLETE METABOLIC PANEL WITH GFR -     Magnesium -     Urinalysis w microscopic + reflex cultur  Screening for hematuria or proteinuria -     Microalbumin / creatinine urine ratio  Right hemiparesis (HCC) Continue with walker, PT as needed  CKD (chronic kidney disease) stage 3b, GFR 30-44 ml/min (HCC) AKI in fall 2020, s/p R ureteral stone  Increase fluids, avoid NSAIDS, monitor sugars, will monitor -     COMPLETE METABOLIC PANEL WITH GFR -     Urinalysis w microscopic + reflex cultur -     Microalbumin / creatinine urine ratio  Osteopenia - mild, continue DEXA monitoring, continue Vit D and Ca, weight bearing exercises  History of renal stone Push water intake   Urinary frequency ? R/t cysto, check UA reflex culture today, suggested trial off of myrbetriq, may not need indefinitely.   Discussed med's effects and SE's. Screening labs and tests as requested with regular follow-up as recommended. Over 40 minutes of exam, counseling, chart review, and complex, high level critical  decision making was performed this visit.   Future Appointments  Date Time Provider [place]  06/03/2020 11:15 AM Vicie Mutters, PA-C [facility] None  09/03/2020  3:00 PM Liane Comber, NP [facility] None     HPI  64 y.o. female  presents for a complete physical and follow up for has Epilepsy, grand mal ([place]); 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 ([place]); Headache, unspecified headache type; Right hemiparesis ([place]); Labile hypertension; Hyperlipidemia, mixed; Abnormal glucose; Vitamin D deficiency; CKD (chronic kidney disease) stage 3, GFR 30-59 ml/min; Osteopenia; Insulin resistance; and Medication management on their problem list.   Follows with Dr. 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 Patient has residual R hemiparesis. Her last seizure reportedly was circa 1988. She is on topamax.  Has occasional headaches which she takes fioricet which works well for her.  She moved into an apartment at [address], last year in June 2019, states her sister is trying to get her moved into [facility] in [place], where her father, uncle and cousin are there. Possible placement this fall if qualifies. She does not drive, currently her church and friends take her places.   She saw nephrology in  Dec 2020 for unexplained worsening Kidney function, went from GFR 56 in 08/2018 to GFR of 26/27 in 02/2019, had US renal and CT AB, had stone removal with [facility] Jan 7th with some improvement in renal functions.  She reported frequency (q30-45 min) following procedure, was prescribed myretriq which she reports has been very helpful, taking 25 mg daily with good results, no symptoms in over 6 weeks.  Lab Results  Component Value Date   GFRNONAA 38 (L) 06/01/2019   GFRNONAA 31 (L)  05/11/2019   GFRNONAA 30 (L) 05/08/2019   BMI is Body mass index is 25.01 kg/m., she has not been working on diet and exercise, attributes weight gain to current stay at home order. She walks most days, trying to increase. Pushes water intake, 4-5 bottles daily.  Wt Readings from Last 3 Encounters:  09/04/19 148 lb (67.1 kg)  06/01/19 145 lb 12.8 oz (66.1 kg)  05/10/19 146 lb 4.8 oz (66.4 kg)   Today their BP is BP: 114/76 She does workout. She denies chest pain, shortness of breath, dizziness.   She is not on cholesterol medication and denies myalgias. Her cholesterol is not at goal of LDL <100, mild elevations managed by lifestyle modification. Also taking citrucel.  The cholesterol last visit was:   Lab Results  Component Value Date   CHOL 190 06/01/2019   HDL 45 (L) 06/01/2019   LDLCALC 116 (H) 06/01/2019   TRIG 170 (H) 06/01/2019   CHOLHDL 4.2 06/01/2019    Last A1C in the office was:  Lab Results  Component Value Date   HGBA1C 5.1 08/22/2018   Last GFR: Lab Results  Component Value Date   GFRNONAA 38 (L) 06/01/2019   Patient is on Vitamin D supplement.   Lab Results  Component Value Date   VD25OH 94 11/28/2018        Current Medications:  Current Outpatient Medications on File Prior to Visit  Medication Sig Dispense Refill  . baclofen (LIORESAL) 10 MG tablet Take 10 mg by mouth at bedtime.     . butalbital-acetaminophen-caffeine (FIORICET, ESGIC) 50-325-40 MG tablet Take 1 tablet by mouth 2 (two) times daily as needed for headache or migraine.     . calcium carbonate (TUMS - DOSED IN MG ELEMENTAL CALCIUM) 500 MG chewable tablet Chew 1 tablet by mouth daily.    . Calcium Citrate (CITRACAL PO) Take 1 Dose by mouth daily.    . Calcium Citrate-Vitamin D (CALCIUM + D PO) Take 1 tablet by mouth daily.     . fexofenadine (ALLEGRA) 180 MG tablet Take 180 mg by mouth daily as needed for allergies.     . mirabegron ER (MYRBETRIQ) 50 MG TB24 tablet Take 1 tablet (50 mg  total) by mouth daily. 30 tablet 5  . Multiple Vitamins-Minerals (MULTIVITAMIN WITH MINERALS) tablet Take 1 tablet by mouth daily.     . Omega-3 Fatty Acids (FISH OIL PO) Take 1 capsule by mouth daily.    Marland Kitchen POTASSIUM PO Take 1 tablet by mouth daily.    Marland Kitchen Propylene Glycol (SYSTANE BALANCE) 0.6 % SOLN Place 1 drop into both eyes 2 (two) times daily.    Marland Kitchen topiramate (TOPAMAX) 200 MG tablet Takes 1 &1/2 tabs 2 x /day    (3 tabs /day) (Patient taking differently: Take 300 mg by mouth 2 (two) times daily. MD wants her to only take the American made Zy Topamax) 270 tablet 1  . Vitamin D3 (VITAMIN D) 25 MCG tablet Take 1,000  Units by mouth daily.      No current facility-administered medications on file prior to visit.   Allergies:  Allergies  Allergen Reactions  . Aspirin Other (See Comments)    CONTRAINDICATED DUE TO HISTORY OF BLEEDING IN BRAIN  . Cheese     Cant take due to history of severe migraines  . Chocolate     Cant take due to history of severe migraines  . Coffee Flavor     Cant take due to history of severe migraines  . Other      Nuts - Cant take due to history of severe migraines  . Red Wine Complex [Germanium]     Cant take due to history of severe migraines   Medical History:  She has Epilepsy, grand mal ([place]); 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 ([place]); Headache, unspecified headache type; Right hemiparesis ([place]); Labile hypertension; Hyperlipidemia, mixed; Abnormal glucose; Vitamin D deficiency; CKD (chronic kidney disease) stage 3, GFR 30-59 ml/min; Osteopenia; Insulin resistance; and Medication management on their problem list. Health Maintenance:   Immunization History  Administered Date(s) Administered  . Influenza Inj Mdck Quad Pf 02/16/2017  . Influenza Inj Mdck Quad With Preservative 02/16/2018, 03/01/2019  . Influenza,inj,Quad PF,6+ Mos 03/01/2017  . Influenza-Unspecified 03/01/2017  . PFIZER SARS-COV-2 Vaccination 07/22/2019, 08/12/2019   . Pneumococcal-Unspecified 05/03/2004  . Tdap 08/11/2009  . Zoster 04/14/2012    Preventative care: Last colonoscopy: [facility], due 5 years, will need referral to local provider  Last mammogram: 07/2019 Last pap smear/pelvic exam: 12/2017 DONE DEXA: 01/2018 L fem T -1.1  Prior vaccinations: TD or Tdap: 2011, due will defer unless cut due to cost  Influenza: 02/2019  Pneumococcal: 2006 Prevnar13: due age 76 Shingles/Zostavax: 2013 Covid 19: 2/2, pfizer, 2021  Names of Other Physician/Practitioners you currently use: 1. [facility] here for primary care 2. [facility], eye doctor, last visit 2020, has apointment Aug 2021, glasses 3. Dr. Conley Canal, dentist, last visit 08/21/2019 4. Dr. Marland Kitchen [facility], derm, last 2020, goes annually has scheduled in July 5. Dr. Adolphus Birchwood, neurology, last visit 2019, goes q2 years  Patient Care Team: Unk Pinto, MD as PCP - General (Internal Medicine)  Surgical History:  She has a past surgical history that includes Brain surgery (09/19/1978); Foot surgery (Right, 10/1985); Tubal ligation; and Cystoscopy/ureteroscopy/holmium laser/stent placement (Left, 05/10/2019). Family History:  Herfamily history includes CAD in her maternal uncle; COPD in her maternal uncle; Cancer in her father; Dementia in her father and paternal uncle; Diabetes in her father; Heart attack in her maternal grandfather and mother; Heart disease in her mother; Heart failure in her mother; Hyperlipidemia in her father and paternal uncle; Hypertension in her paternal uncle; Stroke in her paternal grandmother. Social History:  She reports that she has never smoked. She has never used smokeless tobacco. She reports that she does not drink alcohol or use drugs.  Review of Systems: Review of Systems  Constitutional: Negative for malaise/fatigue and weight loss.  HENT: Negative for  hearing loss and tinnitus.   Eyes: Negative for blurred vision and double vision.  Respiratory: Negative for cough, sputum production, shortness of breath and wheezing.   Cardiovascular: Negative for chest pain, palpitations, orthopnea, claudication, leg swelling and PND.  Gastrointestinal: Negative for abdominal pain, blood in stool, constipation, diarrhea, heartburn, melena, nausea and vomiting.  Genitourinary: Negative.   Musculoskeletal: Positive for falls (r/t R hemiparesis, no injury). Negative for  joint pain and myalgias.  Skin: Negative for rash.  Neurological: Positive for headaches. Negative for dizziness, tingling, sensory change and weakness.  Endo/Heme/Allergies: Negative for polydipsia.  Psychiatric/Behavioral: Negative.  Negative for depression, memory loss, substance abuse and suicidal ideas. The patient is not nervous/anxious and does not have insomnia.   All other systems reviewed and are negative.   Physical Exam: Estimated body mass index is 25.01 kg/m as calculated from the following:   Height as of this encounter: 5' 4.5" (1.638 m).   Weight as of this encounter: 148 lb (67.1 kg). BP 114/76   Pulse 79   Temp (!) 97 F (36.1 C)   Resp 16   Ht 5' 4.5" (1.638 m)   Wt 148 lb (67.1 kg)   SpO2 98%   BMI 25.01 kg/m  General Appearance: Well nourished, in no apparent distress.  Eyes: PERRLA, EOMs, conjunctiva no swelling or erythema Sinuses: No Frontal/maxillary tenderness  ENT/Mouth: Ext aud canals clear, normal light reflex with TMs without erythema, bulging. Good dentition. No erythema, swelling, or exudate on post pharynx. Tonsils not swollen or erythematous. Hearing normal with bilateral hearing aids Neck: Supple, thyroid normal. No bruits  Respiratory: Respiratory effort normal, BS equal bilaterally without rales, rhonchi, wheezing or stridor.   Cardio: RRR without murmurs, rubs or gallops. Brisk peripheral pulses without edema.  Chest: symmetric, with normal  excursions and percussion.  Breasts: Just had normal mammogram; declines today Abdomen: Soft, nontender, no guarding, rebound, hernias, masses, or organomegaly.  Lymphatics: Non tender without lymphadenopathy.  Genitourinary: Defer Musculoskeletal: Poor ROM through R upper and lower extremities, contracture of right hand present, 5/5 strength of left, and Rt Hp with limping gait, brace on ankle. Has cane. Skin: Warm, dry without rashes, lesions, ecchymosis. Neuro: Cranial nerves intact, reflexes equal bilaterally. Normal muscle tone of left, Sensation intact.  Psych: Awake and oriented X 3, normal affect, Insight and Judgment fair.   EKG: sinus rhythm, IRBBB, no ST changes  Gorden Harms Calia Napp 3:53 PM [facility]

## 2019-09-04 ENCOUNTER — Other Ambulatory Visit: Payer: Self-pay

## 2019-09-04 ENCOUNTER — Ambulatory Visit (INDEPENDENT_AMBULATORY_CARE_PROVIDER_SITE_OTHER): Payer: Medicare Other | Admitting: Adult Health

## 2019-09-04 ENCOUNTER — Encounter: Payer: Self-pay | Admitting: Adult Health

## 2019-09-04 VITALS — BP 114/76 | HR 79 | Temp 97.0°F | Resp 16 | Ht 64.5 in | Wt 148.0 lb

## 2019-09-04 DIAGNOSIS — Z79899 Other long term (current) drug therapy: Secondary | ICD-10-CM

## 2019-09-04 DIAGNOSIS — R0989 Other specified symptoms and signs involving the circulatory and respiratory systems: Secondary | ICD-10-CM

## 2019-09-04 DIAGNOSIS — Z131 Encounter for screening for diabetes mellitus: Secondary | ICD-10-CM

## 2019-09-04 DIAGNOSIS — Z6822 Body mass index (BMI) 22.0-22.9, adult: Secondary | ICD-10-CM

## 2019-09-04 DIAGNOSIS — Z87442 Personal history of urinary calculi: Secondary | ICD-10-CM

## 2019-09-04 DIAGNOSIS — G40219 Localization-related (focal) (partial) symptomatic epilepsy and epileptic syndromes with complex partial seizures, intractable, without status epilepticus: Secondary | ICD-10-CM | POA: Diagnosis not present

## 2019-09-04 DIAGNOSIS — E8881 Metabolic syndrome: Secondary | ICD-10-CM | POA: Diagnosis not present

## 2019-09-04 DIAGNOSIS — M8580 Other specified disorders of bone density and structure, unspecified site: Secondary | ICD-10-CM | POA: Diagnosis not present

## 2019-09-04 DIAGNOSIS — R519 Headache, unspecified: Secondary | ICD-10-CM

## 2019-09-04 DIAGNOSIS — E88819 Insulin resistance, unspecified: Secondary | ICD-10-CM

## 2019-09-04 DIAGNOSIS — E559 Vitamin D deficiency, unspecified: Secondary | ICD-10-CM

## 2019-09-04 DIAGNOSIS — G8191 Hemiplegia, unspecified affecting right dominant side: Secondary | ICD-10-CM | POA: Diagnosis not present

## 2019-09-04 DIAGNOSIS — R7309 Other abnormal glucose: Secondary | ICD-10-CM | POA: Diagnosis not present

## 2019-09-04 DIAGNOSIS — N1832 Chronic kidney disease, stage 3b: Secondary | ICD-10-CM

## 2019-09-04 DIAGNOSIS — Z0001 Encounter for general adult medical examination with abnormal findings: Secondary | ICD-10-CM | POA: Diagnosis not present

## 2019-09-04 DIAGNOSIS — Z6825 Body mass index (BMI) 25.0-25.9, adult: Secondary | ICD-10-CM

## 2019-09-04 DIAGNOSIS — E782 Mixed hyperlipidemia: Secondary | ICD-10-CM

## 2019-09-04 DIAGNOSIS — G40309 Generalized idiopathic epilepsy and epileptic syndromes, not intractable, without status epilepticus: Secondary | ICD-10-CM

## 2019-09-04 DIAGNOSIS — G40409 Other generalized epilepsy and epileptic syndromes, not intractable, without status epilepticus: Secondary | ICD-10-CM

## 2019-09-04 NOTE — Patient Instructions (Addendum)
  Tonya Thompson , Thank you for taking time to come for your Medicare Wellness Visit. I appreciate your ongoing commitment to your health goals. Please review the following plan we discussed and let me know if I can assist you in the future.   These are the goals we discussed: Goals    . Exercise 150 min/wk Moderate Activity    . LDL CALC < 100       This is a list of the screening recommended for you and due dates:  Health Maintenance  Topic Date Due  . COVID-19 Vaccine (1) Never done  . Tetanus Vaccine  08/12/2019  . Flu Shot  12/02/2019  . Colon Cancer Screening  07/01/2020  . Pap Smear  12/06/2020  . Mammogram  07/02/2021  .  Hepatitis C: One time screening is recommended by Center for Disease Control  (CDC) for  adults born from [birth year] through 1963.   Discontinued  . HIV Screening  Discontinued     Please try stopping myrbetriq to see if your urinary frequency has resolved - restart if previous symptoms resume.       Know what a healthy weight is for you (roughly BMI <25) and aim to maintain this  Aim for 7+ servings of fruits and vegetables daily  65-80+ fluid ounces of water or unsweet tea for healthy kidneys  Limit to max 1 drink of alcohol per day; avoid smoking/tobacco  Limit animal fats in diet for cholesterol and heart health - choose grass fed whenever available  Avoid highly processed foods, and foods high in saturated/trans fats  Aim for low stress - take time to unwind and care for your mental health  Aim for 150 min of moderate intensity exercise weekly for heart health, and weights twice weekly for bone health  Aim for 7-9 hours of sleep daily      A great goal to work towards is aiming to get in a serving daily of some of the most nutritionally dense foods - G- BOMBS daily

## 2019-09-05 LAB — COMPLETE METABOLIC PANEL WITH GFR
AG Ratio: 1.4 (calc) (ref 1.0–2.5)
ALT: 16 U/L (ref 6–29)
AST: 18 U/L (ref 10–35)
Albumin: 4.1 g/dL (ref 3.6–5.1)
Alkaline phosphatase (APISO): 103 U/L (ref 37–153)
BUN/Creatinine Ratio: 21 (calc) (ref 6–22)
BUN: 30 mg/dL — ABNORMAL HIGH (ref 7–25)
CO2: 25 mmol/L (ref 20–32)
Calcium: 9.7 mg/dL (ref 8.6–10.4)
Chloride: 104 mmol/L (ref 98–110)
Creat: 1.41 mg/dL — ABNORMAL HIGH (ref 0.50–0.99)
GFR, Est African American: 46 mL/min/{1.73_m2} — ABNORMAL LOW (ref >=60)
GFR, Est Non African American: 40 mL/min/{1.73_m2} — ABNORMAL LOW (ref >=60)
Globulin: 2.9 g/dL (calc) (ref 1.9–3.7)
Glucose, Bld: 99 mg/dL (ref 65–99)
Potassium: 3.9 mmol/L (ref 3.5–5.3)
Sodium: 138 mmol/L (ref 135–146)
Total Bilirubin: 0.4 mg/dL (ref 0.2–1.2)
Total Protein: 7 g/dL (ref 6.1–8.1)

## 2019-09-05 LAB — CBC WITH DIFFERENTIAL/PLATELET
Absolute Monocytes: 442 cells/uL (ref 200–950)
Basophils Absolute: 39 cells/uL (ref 0–200)
Basophils Relative: 0.7 %
Eosinophils Absolute: 78 cells/uL (ref 15–500)
Eosinophils Relative: 1.4 %
HCT: 37.6 % (ref 35.0–45.0)
Hemoglobin: 12.3 g/dL (ref 11.7–15.5)
Lymphs Abs: 1999 cells/uL (ref 850–3900)
MCH: 31.1 pg (ref 27.0–33.0)
MCHC: 32.7 g/dL (ref 32.0–36.0)
MCV: 94.9 fL (ref 80.0–100.0)
MPV: 10.4 fL (ref 7.5–12.5)
Monocytes Relative: 7.9 %
Neutro Abs: 3041 cells/uL (ref 1500–7800)
Neutrophils Relative %: 54.3 %
Platelets: 245 10*3/uL (ref 140–400)
RBC: 3.96 10*6/uL (ref 3.80–5.10)
RDW: 12 % (ref 11.0–15.0)
Total Lymphocyte: 35.7 %
WBC: 5.6 10*3/uL (ref 3.8–10.8)

## 2019-09-05 LAB — URINALYSIS, ROUTINE W REFLEX MICROSCOPIC
Bacteria, UA: NONE SEEN /HPF
Bilirubin Urine: NEGATIVE
Glucose, UA: NEGATIVE
Hgb urine dipstick: NEGATIVE
Hyaline Cast: NONE SEEN /LPF
Ketones, ur: NEGATIVE
Nitrite: NEGATIVE
Protein, ur: NEGATIVE
RBC / HPF: NONE SEEN /HPF (ref 0–2)
Specific Gravity, Urine: 1.004 (ref 1.001–1.03)
pH: 7 (ref 5.0–8.0)

## 2019-09-05 LAB — LIPID PANEL
Cholesterol: 196 mg/dL (ref <200)
HDL: 45 mg/dL — ABNORMAL LOW (ref >=50)
LDL Cholesterol (Calc): 123 mg/dL (calc) — ABNORMAL HIGH
Non-HDL Cholesterol (Calc): 151 mg/dL (calc) — ABNORMAL HIGH (ref <130)
Total CHOL/HDL Ratio: 4.4 (calc) (ref <5.0)
Triglycerides: 162 mg/dL — ABNORMAL HIGH (ref <150)

## 2019-09-05 LAB — TSH: TSH: 2.17 mIU/L (ref 0.40–4.50)

## 2019-09-05 LAB — HEMOGLOBIN A1C
Hgb A1c MFr Bld: 5 % of total Hgb (ref <5.7)
Mean Plasma Glucose: 97 (calc)
eAG (mmol/L): 5.4 (calc)

## 2019-09-05 LAB — MICROALBUMIN / CREATININE URINE RATIO
Creatinine, Urine: 17 mg/dL — ABNORMAL LOW (ref 20–275)
Microalb Creat Ratio: 71 mcg/mg creat — ABNORMAL HIGH (ref <30)
Microalb, Ur: 1.2 mg/dL

## 2019-09-05 LAB — MAGNESIUM: Magnesium: 1.9 mg/dL (ref 1.5–2.5)

## 2019-10-19 ENCOUNTER — Other Ambulatory Visit: Payer: Self-pay

## 2019-10-19 ENCOUNTER — Ambulatory Visit (INDEPENDENT_AMBULATORY_CARE_PROVIDER_SITE_OTHER): Payer: Medicare Other | Admitting: *Deleted

## 2019-10-19 VITALS — BP 120/74 | HR 72 | Temp 97.4°F | Resp 16 | Wt 140.0 lb

## 2019-10-19 DIAGNOSIS — Z23 Encounter for immunization: Secondary | ICD-10-CM | POA: Diagnosis not present

## 2019-10-19 DIAGNOSIS — S61412A Laceration without foreign body of left hand, initial encounter: Secondary | ICD-10-CM | POA: Diagnosis not present

## 2019-10-19 NOTE — Progress Notes (Signed)
Patient presented with a laceration on her left fifth finger, due to being cut while opening a container. The area was clean, without a sign of infection. The patient's last TD was 08/2009. Patient was given a TD injection in her left deltoid and tolerated well.

## 2019-12-07 DIAGNOSIS — N200 Calculus of kidney: Secondary | ICD-10-CM | POA: Diagnosis not present

## 2019-12-16 NOTE — Patient Instructions (Signed)

## 2019-12-16 NOTE — Progress Notes (Addendum)
History of Present Illness:       This very nice 64 y.o.  Single WF  presents for 3 month follow up with HTN, HLD, hx/o Seizure Disorder, Pre-Diabetes and Vitamin D Deficiency. She has chronically been on Topiramate for her seizure disorder.      Since last OV, patient has moved into "Independent Living" at [facility].  [[ Copy: Patient has hx/o seizure disorder (age 49 yo) and hx/o  Brain AVM at age 13 yo with a 1st stroke at age 26 (57) and 3 more strokes by age 70 yo (87) when she had he 1st surg for AVM.  Her last seizure reportedly was circa 1988. Patient follows with Follows with Dr. Duwayne Heck in [place].  Patient has a residual mild spastic Rt HP.]]       Patient is followed expectantly with hx/o labile  HTN & BP has been controlled at home. Today's BP is at goal - 114/70.  Patient has CKD 3b  (GFR 40 ) consequent of her HTN. Patient has had no complaints of any cardiac type chest pain, palpitations, dyspnea / orthopnea / PND, dizziness, claudication, or dependent edema.      Hyperlipidemia is not controlled with die. Last Lipids were not at goal:  Lab Results  Component Value Date   CHOL 196 09/04/2019   HDL 45 (L) 09/04/2019   LDLCALC 123 (H) 09/04/2019   TRIG 162 (H) 09/04/2019   CHOLHDL 4.4 09/04/2019    Also, the patient has history of history of PreDiabetes/ Insulin Resistance and has had no symptoms of reactive hypoglycemia, diabetic polys, paresthesias or visual blurring.  Last A1c was Normal & at goal:  Lab Results  Component Value Date   HGBA1C 5.0 09/04/2019           Further, the patient also has history of Vitamin D Deficiency and supplements vitamin D without any suspected side-effects. Last vitamin D was at goal:  Lab Results  Component Value Date   VD25OH 94 11/28/2018    Current Outpatient Medications on File Prior to Visit  Medication Sig  . baclofen (LIORESAL) 10 MG tablet Take 10 mg by mouth at bedtime.   .  butalbital-acetaminophen-caffeine (FIORICET, ESGIC) 50-325-40 MG tablet Take 1 tablet by mouth 2 (two) times daily as needed for headache or migraine.   . calcium carbonate (TUMS - DOSED IN MG ELEMENTAL CALCIUM) 500 MG chewable tablet Chew 1 tablet by mouth daily.  . Calcium Citrate (CITRACAL PO) Take 1 Dose by mouth daily.  . Calcium Citrate-Vitamin D (CALCIUM + D PO) Take 1 tablet by mouth daily.   . fexofenadine (ALLEGRA) 180 MG tablet Take 180 mg by mouth daily as needed for allergies.   . Multiple Vitamins-Minerals (MULTIVITAMIN WITH MINERALS) tablet Take 1 tablet by mouth daily.   . Omega-3 Fatty Acids (FISH OIL PO) Take 1 capsule by mouth daily.  Marland Kitchen POTASSIUM PO Take 1 tablet by mouth daily.  Marland Kitchen Propylene Glycol (SYSTANE BALANCE) 0.6 % SOLN Place 1 drop into both eyes 2 (two) times daily.  Marland Kitchen topiramate (TOPAMAX) 200 MG tablet Takes 1 &1/2 tabs 2 x /day    (3 tabs /day) (Patient taking differently: Take 300 mg by mouth 2 (two) times daily. MD wants her to only take the American made Zy Topamax)  . Vitamin D3 (VITAMIN D) 25 MCG tablet Take 1,000 Units by mouth daily.    No current facility-administered medications on file prior to visit.  Allergies  Allergen Reactions  . Aspirin Other (See Comments)    CONTRAINDICATED DUE TO HISTORY OF BLEEDING IN BRAIN  . Cheese     Cant take due to history of severe migraines  . Chocolate     Cant take due to history of severe migraines  . Coffee Flavor     Cant take due to history of severe migraines  . Other      Nuts - Cant take due to history of severe migraines  . Red Wine Complex [Germanium]     Cant take due to history of severe migraines    PMHx:   Past Medical History:  Diagnosis Date  . Acute kidney injury ([place]) 09/03/2019  . AVM (arteriovenous malformation) brain   . Seizures ([place])    Grand Mal & Partial complex seizure disorder  . Stroke ([place])   . Ureteral stone 05/10/2019    Immunization History  Administered Date(s)  Administered  . Influenza Inj Mdck Quad Pf 02/16/2017  . Influenza Inj Mdck Quad With Preservative 02/16/2018, 03/01/2019  . Influenza,inj,Quad PF,6+ Mos 03/01/2017  . Influenza-Unspecified 03/01/2017  . PFIZER SARS-COV-2 Vaccination 07/22/2019, 08/12/2019  . Pneumococcal-Unspecified 05/03/2004  . Td 10/19/2019  . Tdap 08/11/2009  . Zoster 04/14/2012    Past Surgical History:  Procedure Laterality Date  . BRAIN SURGERY  09/19/1978   at [facility], Dr. Leida Lauth  . CYSTOSCOPY/URETEROSCOPY/HOLMIUM LASER/STENT PLACEMENT Left 05/10/2019   Procedure: CYSTOSCOPY/RETROGRADE/URETEROSCOPY/HOLMIUM LASER/STENT PLACEMENT;  Surgeon: Raynelle Bring, MD;  Location: [facility];  Service: Urology;  Laterality: Left;  . FOOT SURGERY Right 10/1985   ankle fusion, at [facility], Dr. Lorelee Cover  . TUBAL LIGATION      FHx:    Reviewed / unchanged  SHx:    Reviewed / unchanged   Systems Review:  Constitutional: Denies fever, chills, wt changes, headaches, insomnia, fatigue, night sweats, change in appetite. Eyes: Denies redness, blurred vision, diplopia, discharge, itchy, watery eyes.  ENT: Denies discharge, congestion, post nasal drip, epistaxis, sore throat, earache, hearing loss, dental pain, tinnitus, vertigo, sinus pain, snoring.  CV: Denies chest pain, palpitations, irregular heartbeat, syncope, dyspnea, diaphoresis, orthopnea, PND, claudication or edema. Respiratory: denies cough, dyspnea, DOE, pleurisy, hoarseness, laryngitis, wheezing.  Gastrointestinal: Denies dysphagia, odynophagia, heartburn, reflux, water brash, abdominal pain or cramps, nausea, vomiting, bloating, diarrhea, constipation, hematemesis, melena, hematochezia  or hemorrhoids. Genitourinary: Denies dysuria, frequency, urgency, nocturia, hesitancy, discharge, hematuria or flank pain. Musculoskeletal: Denies arthralgias, myalgias, stiffness, jt. swelling, pain, limping or strain/sprain.  Skin: Denies pruritus, rash, hives, warts, acne, eczema or  change in skin lesion(s). Neuro: No weakness, tremor, incoordination, spasms, paresthesia or pain. Psychiatric: Denies confusion, memory loss or sensory loss. Endo: Denies change in weight, skin or hair change.  Heme/Lymph: No excessive bleeding, bruising or enlarged lymph nodes.  Physical Exam  BP 114/70   Pulse 68   Temp (!) 97.3 F (36.3 C)   Resp 16   Ht 5\' 6"  (1.676 m)   Wt 148 lb 12.8 oz (67.5 kg)   BMI 24.02 kg/m   Appears  well nourished, well groomed  and in no distress.  Eyes: PERRLA, EOMs, conjunctiva no swelling or erythema. Sinuses: No frontal/maxillary tenderness ENT/Mouth: EAC's clear, TM's nl w/o erythema, bulging. Nares clear w/o erythema, swelling, exudates. Oropharynx clear without erythema or exudates. Oral hygiene is good. Tongue normal, non obstructing. Hearing intact.  Neck: Supple. Thyroid not palpable. Car 2+/2+ without bruits, nodes or JVD. Chest: Respirations nl with BS clear & equal w/o rales, rhonchi, wheezing  or stridor.  Cor: Heart sounds normal w/ regular rate and rhythm without sig. murmurs, gallops, clicks or rubs. Peripheral pulses normal and equal  without edema.  Abdomen: Soft & bowel sounds normal. Non-tender w/o guarding, rebound, hernias, masses or organomegaly.  Lymphatics: Unremarkable.  Musculoskeletal: Mild Rt spastic hemi-paresisandlimping gait.Flexion contracturing of the Rt wrist, hand & fingers. Gait supported by a walker.  Skin: Warm, dry without exposed rashes, lesions or ecchymosis apparent.  Neuro: Cranial nerves intact, reflexes equal bilaterally. Sensory-motor testing grossly intact. Tendon reflexes grossly hyperactive R>L Pysch: Alert & oriented x 3.  Insight and judgement nl & appropriate. No ideations.  Assessment and Plan:  1. Labile hypertension  - Continue medication, monitor blood pressure at home.  - Continue DASH diet.  Reminder to go to the ER if any CP,  SOB, nausea, dizziness, severe HA, changes  vision/speech.  - CBC with Differential/Platelet - COMPLETE METABOLIC PANEL WITH GFR - Magnesium - TSH  2. Hyperlipidemia, mixed  - Continue diet/meds, exercise,& lifestyle modifications.  - Continue monitor periodic cholesterol/liver & renal functions   - Lipid panel - TSH  3. Abnormal glucose  - Continue diet, exercise  - Lifestyle modifications.  - Monitor appropriate labs.  - Insulin, random  4. Insulin resistance  - Continue diet, exercise  - Lifestyle modifications.  - Monitor appropriate labs.  - Insulin, random  5. Vitamin D deficiency  - Continue supplementation.  - VITAMIN D 25 Hydroxy   6. History of Partial symptomatic epilepsy with complex partial seizures,  intractable, without status epilepticus (HCC)   7. Stage 3b chronic kidney disease  - COMPLETE METABOLIC PANEL WITH GFR  8. Medication management  - CBC with Differential/Platelet - COMPLETE METABOLIC PANEL WITH GFR - Magnesium - Lipid panel - TSH - Insulin, random - VITAMIN D 25 Hydroxy        Discussed  regular exercise, BP monitoring, weight control to achieve/maintain BMI less than 25 and discussed med and SE's. Recommended labs to assess and monitor clinical status with further disposition pending results of labs.  I discussed the assessment and treatment plan with the patient. The patient was provided an opportunity to ask questions and all were answered. The patient agreed with the plan and demonstrated an understanding of the instructions.  I provided over 30 minutes of exam, counseling, chart review and  complex critical decision making.   Kirtland Bouchard, MD

## 2019-12-17 ENCOUNTER — Encounter: Payer: Self-pay | Admitting: Internal Medicine

## 2019-12-17 ENCOUNTER — Ambulatory Visit (INDEPENDENT_AMBULATORY_CARE_PROVIDER_SITE_OTHER): Payer: Medicare Other | Admitting: Internal Medicine

## 2019-12-17 ENCOUNTER — Other Ambulatory Visit: Payer: Self-pay

## 2019-12-17 VITALS — BP 114/70 | HR 68 | Temp 97.3°F | Resp 16 | Ht 66.0 in | Wt 148.8 lb

## 2019-12-17 DIAGNOSIS — N1832 Chronic kidney disease, stage 3b: Secondary | ICD-10-CM | POA: Diagnosis not present

## 2019-12-17 DIAGNOSIS — R7309 Other abnormal glucose: Secondary | ICD-10-CM | POA: Diagnosis not present

## 2019-12-17 DIAGNOSIS — E782 Mixed hyperlipidemia: Secondary | ICD-10-CM | POA: Diagnosis not present

## 2019-12-17 DIAGNOSIS — G40219 Localization-related (focal) (partial) symptomatic epilepsy and epileptic syndromes with complex partial seizures, intractable, without status epilepticus: Secondary | ICD-10-CM

## 2019-12-17 DIAGNOSIS — E8881 Metabolic syndrome: Secondary | ICD-10-CM | POA: Diagnosis not present

## 2019-12-17 DIAGNOSIS — Z79899 Other long term (current) drug therapy: Secondary | ICD-10-CM | POA: Diagnosis not present

## 2019-12-17 DIAGNOSIS — E559 Vitamin D deficiency, unspecified: Secondary | ICD-10-CM

## 2019-12-17 DIAGNOSIS — R0989 Other specified symptoms and signs involving the circulatory and respiratory systems: Secondary | ICD-10-CM | POA: Diagnosis not present

## 2019-12-18 LAB — CBC WITH DIFFERENTIAL/PLATELET
Absolute Monocytes: 489 cells/uL (ref 200–950)
Basophils Absolute: 28 cells/uL (ref 0–200)
Basophils Relative: 0.6 %
Eosinophils Absolute: 141 cells/uL (ref 15–500)
Eosinophils Relative: 3 %
HCT: 39 % (ref 35.0–45.0)
Hemoglobin: 12.8 g/dL (ref 11.7–15.5)
Lymphs Abs: 1448 cells/uL (ref 850–3900)
MCH: 30.8 pg (ref 27.0–33.0)
MCHC: 32.8 g/dL (ref 32.0–36.0)
MCV: 94 fL (ref 80.0–100.0)
MPV: 10.8 fL (ref 7.5–12.5)
Monocytes Relative: 10.4 %
Neutro Abs: 2594 cells/uL (ref 1500–7800)
Neutrophils Relative %: 55.2 %
Platelets: 242 10*3/uL (ref 140–400)
RBC: 4.15 10*6/uL (ref 3.80–5.10)
RDW: 12 % (ref 11.0–15.0)
Total Lymphocyte: 30.8 %
WBC: 4.7 10*3/uL (ref 3.8–10.8)

## 2019-12-18 LAB — LIPID PANEL
Cholesterol: 186 mg/dL (ref <200)
HDL: 50 mg/dL (ref >=50)
LDL Cholesterol (Calc): 111 mg/dL (calc) — ABNORMAL HIGH
Non-HDL Cholesterol (Calc): 136 mg/dL (calc) — ABNORMAL HIGH (ref <130)
Total CHOL/HDL Ratio: 3.7 (calc) (ref <5.0)
Triglycerides: 133 mg/dL (ref <150)

## 2019-12-18 LAB — COMPLETE METABOLIC PANEL WITH GFR
AG Ratio: 1.4 (calc) (ref 1.0–2.5)
ALT: 18 U/L (ref 6–29)
AST: 18 U/L (ref 10–35)
Albumin: 4 g/dL (ref 3.6–5.1)
Alkaline phosphatase (APISO): 113 U/L (ref 37–153)
BUN/Creatinine Ratio: 26 (calc) — ABNORMAL HIGH (ref 6–22)
BUN: 34 mg/dL — ABNORMAL HIGH (ref 7–25)
CO2: 28 mmol/L (ref 20–32)
Calcium: 9.6 mg/dL (ref 8.6–10.4)
Chloride: 106 mmol/L (ref 98–110)
Creat: 1.32 mg/dL — ABNORMAL HIGH (ref 0.50–0.99)
GFR, Est African American: 50 mL/min/{1.73_m2} — ABNORMAL LOW (ref >=60)
GFR, Est Non African American: 43 mL/min/{1.73_m2} — ABNORMAL LOW (ref >=60)
Globulin: 2.8 g/dL (calc) (ref 1.9–3.7)
Glucose, Bld: 91 mg/dL (ref 65–99)
Potassium: 4 mmol/L (ref 3.5–5.3)
Sodium: 140 mmol/L (ref 135–146)
Total Bilirubin: 0.3 mg/dL (ref 0.2–1.2)
Total Protein: 6.8 g/dL (ref 6.1–8.1)

## 2019-12-18 LAB — INSULIN, RANDOM: Insulin: 9.1 u[IU]/mL

## 2019-12-18 LAB — TSH: TSH: 2.04 mIU/L (ref 0.40–4.50)

## 2019-12-18 LAB — MAGNESIUM: Magnesium: 2.1 mg/dL (ref 1.5–2.5)

## 2019-12-18 LAB — VITAMIN D 25 HYDROXY (VIT D DEFICIENCY, FRACTURES): Vit D, 25-Hydroxy: 60 ng/mL (ref 30–100)

## 2019-12-18 NOTE — Progress Notes (Signed)
===========================================================  -   Kidney Functions are stable in stage 3 and better than 1 year ago ===========================================================  - Chol = 186 -  Excellent   - Very low risk for Heart Attack  / Stroke =======================================================  - But, the bad LDL Chol = 111 is too high (Ideal or Goal is less than 70 )   - So................  - Recommend la stricter low cholesterol diet   - Cholesterol only comes from animal sources  - ie. meat, dairy, egg yolks  - Eat all the vegetables you want.  - Avoid meat, especially red meat - Beef AND Pork .  - Avoid cheese & dairy - milk & ice cream.     - Cheese is the most concentrated form of trans-fats which  is the worst thing to clog up our arteries.   - Veggie cheese is OK which can be found in the fresh  produce section at [employer] or [employer] or [employer] ====================================================  - Vitamin D = 60 - Excellent  - Vitamin D goal is between 70-100.   - Please make sure that you are taking your Vitamin D as directed.   - It is very important as a natural anti-inflammatory and helping the  immune system protect against viral infections, like the Covid-19    helping hair, skin, and nails, as well as reducing stroke and  heart attack risk.   - It helps your bones and helps with mood.  - It also decreases numerous cancer risks so please take  it as directed.   - Low Vit D is associated with a 200-300% higher risk for CANCER   and 200-300% higher risk for HEART   ATTACK  &  STROKE.    - It is also associated with higher death rate at younger ages,   autoimmune diseases like Rheumatoid arthritis, Lupus,  Multiple Sclerosis.     - Also many other serious conditions, like depression,  Alzheimer's Dementia, infertility, muscle aches, fatigue, fibromyalgia - just to name a few.   =========================================================  - All Else - CBC - Kidneys - Electrolytes - Liver - Magnesium & Thyroid    - all  Normal / OK ====================================================

## 2020-01-01 DIAGNOSIS — I69951 Hemiplegia and hemiparesis following unspecified cerebrovascular disease affecting right dominant side: Secondary | ICD-10-CM | POA: Diagnosis not present

## 2020-01-01 DIAGNOSIS — M6281 Muscle weakness (generalized): Secondary | ICD-10-CM | POA: Diagnosis not present

## 2020-01-01 DIAGNOSIS — R29898 Other symptoms and signs involving the musculoskeletal system: Secondary | ICD-10-CM | POA: Diagnosis not present

## 2020-01-01 DIAGNOSIS — R2681 Unsteadiness on feet: Secondary | ICD-10-CM | POA: Diagnosis not present

## 2020-01-02 DIAGNOSIS — M6281 Muscle weakness (generalized): Secondary | ICD-10-CM | POA: Diagnosis not present

## 2020-01-02 DIAGNOSIS — R2681 Unsteadiness on feet: Secondary | ICD-10-CM | POA: Diagnosis not present

## 2020-01-02 DIAGNOSIS — R29898 Other symptoms and signs involving the musculoskeletal system: Secondary | ICD-10-CM | POA: Diagnosis not present

## 2020-01-02 DIAGNOSIS — I69951 Hemiplegia and hemiparesis following unspecified cerebrovascular disease affecting right dominant side: Secondary | ICD-10-CM | POA: Diagnosis not present

## 2020-01-04 DIAGNOSIS — M6281 Muscle weakness (generalized): Secondary | ICD-10-CM | POA: Diagnosis not present

## 2020-01-04 DIAGNOSIS — R29898 Other symptoms and signs involving the musculoskeletal system: Secondary | ICD-10-CM | POA: Diagnosis not present

## 2020-01-04 DIAGNOSIS — I69951 Hemiplegia and hemiparesis following unspecified cerebrovascular disease affecting right dominant side: Secondary | ICD-10-CM | POA: Diagnosis not present

## 2020-01-04 DIAGNOSIS — R2681 Unsteadiness on feet: Secondary | ICD-10-CM | POA: Diagnosis not present

## 2020-01-07 DIAGNOSIS — M6281 Muscle weakness (generalized): Secondary | ICD-10-CM | POA: Diagnosis not present

## 2020-01-07 DIAGNOSIS — R29898 Other symptoms and signs involving the musculoskeletal system: Secondary | ICD-10-CM | POA: Diagnosis not present

## 2020-01-07 DIAGNOSIS — R2681 Unsteadiness on feet: Secondary | ICD-10-CM | POA: Diagnosis not present

## 2020-01-07 DIAGNOSIS — I69951 Hemiplegia and hemiparesis following unspecified cerebrovascular disease affecting right dominant side: Secondary | ICD-10-CM | POA: Diagnosis not present

## 2020-01-08 DIAGNOSIS — I69951 Hemiplegia and hemiparesis following unspecified cerebrovascular disease affecting right dominant side: Secondary | ICD-10-CM | POA: Diagnosis not present

## 2020-01-08 DIAGNOSIS — R29898 Other symptoms and signs involving the musculoskeletal system: Secondary | ICD-10-CM | POA: Diagnosis not present

## 2020-01-08 DIAGNOSIS — M6281 Muscle weakness (generalized): Secondary | ICD-10-CM | POA: Diagnosis not present

## 2020-01-08 DIAGNOSIS — R2681 Unsteadiness on feet: Secondary | ICD-10-CM | POA: Diagnosis not present

## 2020-01-10 DIAGNOSIS — I69951 Hemiplegia and hemiparesis following unspecified cerebrovascular disease affecting right dominant side: Secondary | ICD-10-CM | POA: Diagnosis not present

## 2020-01-10 DIAGNOSIS — M6281 Muscle weakness (generalized): Secondary | ICD-10-CM | POA: Diagnosis not present

## 2020-01-10 DIAGNOSIS — R29898 Other symptoms and signs involving the musculoskeletal system: Secondary | ICD-10-CM | POA: Diagnosis not present

## 2020-01-10 DIAGNOSIS — R2681 Unsteadiness on feet: Secondary | ICD-10-CM | POA: Diagnosis not present

## 2020-01-14 DIAGNOSIS — R29898 Other symptoms and signs involving the musculoskeletal system: Secondary | ICD-10-CM | POA: Diagnosis not present

## 2020-01-14 DIAGNOSIS — R2681 Unsteadiness on feet: Secondary | ICD-10-CM | POA: Diagnosis not present

## 2020-01-14 DIAGNOSIS — M6281 Muscle weakness (generalized): Secondary | ICD-10-CM | POA: Diagnosis not present

## 2020-01-14 DIAGNOSIS — I69951 Hemiplegia and hemiparesis following unspecified cerebrovascular disease affecting right dominant side: Secondary | ICD-10-CM | POA: Diagnosis not present

## 2020-01-15 DIAGNOSIS — R29898 Other symptoms and signs involving the musculoskeletal system: Secondary | ICD-10-CM | POA: Diagnosis not present

## 2020-01-15 DIAGNOSIS — R2681 Unsteadiness on feet: Secondary | ICD-10-CM | POA: Diagnosis not present

## 2020-01-15 DIAGNOSIS — I69951 Hemiplegia and hemiparesis following unspecified cerebrovascular disease affecting right dominant side: Secondary | ICD-10-CM | POA: Diagnosis not present

## 2020-01-15 DIAGNOSIS — M6281 Muscle weakness (generalized): Secondary | ICD-10-CM | POA: Diagnosis not present

## 2020-01-17 DIAGNOSIS — H2513 Age-related nuclear cataract, bilateral: Secondary | ICD-10-CM | POA: Diagnosis not present

## 2020-01-17 DIAGNOSIS — I69951 Hemiplegia and hemiparesis following unspecified cerebrovascular disease affecting right dominant side: Secondary | ICD-10-CM | POA: Diagnosis not present

## 2020-01-17 DIAGNOSIS — R2681 Unsteadiness on feet: Secondary | ICD-10-CM | POA: Diagnosis not present

## 2020-01-17 DIAGNOSIS — R29898 Other symptoms and signs involving the musculoskeletal system: Secondary | ICD-10-CM | POA: Diagnosis not present

## 2020-01-17 DIAGNOSIS — M6281 Muscle weakness (generalized): Secondary | ICD-10-CM | POA: Diagnosis not present

## 2020-02-13 DIAGNOSIS — Z23 Encounter for immunization: Secondary | ICD-10-CM | POA: Diagnosis not present

## 2020-03-10 DIAGNOSIS — Z23 Encounter for immunization: Secondary | ICD-10-CM | POA: Diagnosis not present

## 2020-03-19 NOTE — Progress Notes (Signed)
FOLLOW UP  Assessment and Plan:   Labile hypertension Monitor blood pressure at home; call if consistently over 130/80 Continue DASH diet.   Reminder to go to the ER if any CP, SOB, nausea, dizziness, severe HA, changes vision/speech, left arm numbness and tingling and jaw pain.  Partial symptomatic epilepsy with complex partial seizures, intractable, without status epilepticus ([place]) Followed by Dr. Marlou Sa, last seizure remote Continue topamax   Vitamin D deficiency At goal at recent check; continue to recommend supplementation for goal of 60-100 Defer vitamin D level  Hyperlipidemia, mixed Will plan to initiate medication if LDL 130+, otherwise low risk  Continue low cholesterol diet and exercise.  Check lipid panel.  -     Lipid panel -     TSH  Abnormal glucose/insulin resistance Recent A1Cs at goal Discussed diet/exercise, weight management  Defer A1C; check CMP -     CMP/GFR  BMI 23 Continue to recommend diet heavy in fruits and veggies and low in animal meats, cheeses, and dairy products, appropriate calorie intake Discuss exercise recommendations routinely Continue to monitor weight at each visit  Medication management -     CBC with Differential/Platelet -     COMPLETE METABOLIC PANEL WITH GFR -     Magnesium  Right hemiparesis (HCC) Continue with cane, PT as needed, doing exercises at home Denies recent falls  CKD (chronic kidney disease) stage 3, GFR 30-59 ml/min (HCC) Increase fluids, avoid NSAIDS, monitor sugars, will monitor   Continue diet and meds as discussed. Further disposition pending results of labs. Discussed med's effects and SE's.   Over 30 minutes of exam, counseling, chart review, and critical decision making was performed.   Future Appointments  Date Time Provider [place]  06/23/2020 10:30 AM Liane Comber, NP [facility] None  09/22/2020  2:00 PM Liane Comber, NP [facility] None     ----------------------------------------------------------------------------------------------------------------------  HPI 64 y.o. female  presents for 3 month follow up on hypertension, cholesterol, glucose management, weight and vitamin D deficiency.   Follows with Dr. 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 Patient has residual R hemiparesis. Her last seizure reportedly was circa 1988. She is on topamax.  Has occasional headaches which she takes fioricet which works well for her.  Has moved into [facility] this August 2021, checks on her dad and uncle regularly, enjoying staying there. She is volunteering feeding residents. She does not drive, currently her church and friends take her places.   BMI is Body mass index is 23.66 kg/m., she has been working on diet and exercise, walks with cane regularly in [place], and does exercises she learned from PT.  Wt Readings from Last 3 Encounters:  03/20/20 146 lb 9.6 oz (66.5 kg)  12/17/19 148 lb 12.8 oz (67.5 kg)  10/19/19 140 lb (63.5 kg)   She does not check BPs at home, today their BP is BP: 110/70  She does workout. She denies chest pain, shortness of breath, dizziness.   She is not on cholesterol medication omega 3 and denies myalgias. Her cholesterol is not at goal of LDL <100. The cholesterol last visit was:   Lab Results  Component Value Date   CHOL 186 12/17/2019   HDL 50  12/17/2019   LDLCALC 111 (H) 12/17/2019   TRIG 133 12/17/2019   CHOLHDL 3.7 12/17/2019    She has been working on diet and exercise for glucose management/insulin resistance, and denies increased appetite, nausea, paresthesia of the feet, polydipsia, polyuria and visual disturbances. Last A1C in the office was:  Lab Results  Component Value  Date   HGBA1C 5.0 09/04/2019   She has CKD III monitored at this office:  She saw nephrology in Dec 2020 for unexplained worsening Kidney function, went from GFR 56 in 08/2018 to GFR of 26/27 in 02/2019, had US renal and CT AB, had stone removal with [facility] Jan 7th with improvement in renal functions She is pushing water intake, aiming for 4 x 16.9 fluid ounces.  Lab Results  Component Value Date   GFRNONAA 43 (L) 12/17/2019   Patient is on Vitamin D supplement.   Lab Results  Component Value Date   VD25OH 60 12/17/2019        Current Medications:  Current Outpatient Medications on File Prior to Visit  Medication Sig  . baclofen (LIORESAL) 10 MG tablet Take 10 mg by mouth at bedtime.   . butalbital-acetaminophen-caffeine (FIORICET, ESGIC) 50-325-40 MG tablet Take 1 tablet by mouth 2 (two) times daily as needed for headache or migraine.   . calcium carbonate (TUMS - DOSED IN MG ELEMENTAL CALCIUM) 500 MG chewable tablet Chew 1 tablet by mouth daily.  . Calcium Citrate (CITRACAL PO) Take 1 Dose by mouth daily.  . Calcium Citrate-Vitamin D (CALCIUM + D PO) Take 1 tablet by mouth daily.   . fexofenadine (ALLEGRA) 180 MG tablet Take 180 mg by mouth daily as needed for allergies.   . Multiple Vitamins-Minerals (MULTIVITAMIN WITH MINERALS) tablet Take 1 tablet by mouth daily.   . Omega-3 Fatty Acids (FISH OIL PO) Take 1 capsule by mouth daily.  Marland Kitchen POTASSIUM PO Take 1 tablet by mouth daily.  Marland Kitchen Propylene Glycol (SYSTANE BALANCE) 0.6 % SOLN Place 1 drop into both eyes 2 (two) times daily.  Marland Kitchen topiramate (TOPAMAX) 200 MG tablet Takes 1 &1/2 tabs 2 x /day    (3 tabs /day) (Patient taking differently: Take 300 mg by mouth 2 (two) times daily. MD wants her to only take the American made Zy Topamax)  . Vitamin D3 (VITAMIN D) 25 MCG tablet Take 1,000 Units by mouth daily.    No current facility-administered medications on file prior to visit.     Allergies:  Allergies  Allergen  Reactions  . Aspirin Other (See Comments)    CONTRAINDICATED DUE TO HISTORY OF BLEEDING IN BRAIN  . Cheese     Cant take due to history of severe migraines  . Chocolate     Cant take due to history of severe migraines  . Coffee Flavor     Cant take due to history of severe migraines  . Other      Nuts - Cant take due to history of severe migraines  . Red Wine Complex [Germanium]     Cant take due to history of severe migraines     Medical History:  Past Medical History:  Diagnosis Date  . Acute kidney injury ([place]) 09/03/2019  . AVM (arteriovenous malformation) brain   . Seizures ([place])    [place] & Partial complex seizure disorder  . Stroke ([place])   . Ureteral stone 05/10/2019   Family history- Reviewed and unchanged Social history- Reviewed and unchanged   Review of Systems:  Review of  Systems  Constitutional: Negative for malaise/fatigue and weight loss.  HENT: Negative for hearing loss and tinnitus.   Eyes: Negative for blurred vision and double vision.  Respiratory: Negative for cough, shortness of breath and wheezing.   Cardiovascular: Negative for chest pain, palpitations, orthopnea, claudication and leg swelling.  Gastrointestinal: Negative for abdominal pain, blood in stool, constipation, diarrhea, heartburn, melena, nausea and vomiting.  Genitourinary: Negative.   Musculoskeletal: Negative for joint pain and myalgias.  Skin: Negative for rash.  Neurological: Negative for dizziness, tingling, sensory change, weakness and headaches.  Endo/Heme/Allergies: Negative for polydipsia.  Psychiatric/Behavioral: Negative.   All other systems reviewed and are negative.     Physical Exam: BP 110/70   Pulse 74   Temp (!) 96.1 F (35.6 C)   Wt 146 lb 9.6 oz (66.5 kg)   SpO2 99%   BMI 23.66 kg/m  Wt Readings from Last 3 Encounters:  03/20/20 146 lb 9.6 oz (66.5 kg)  12/17/19 148 lb 12.8 oz (67.5 kg)  10/19/19 140 lb (63.5 kg)   General appearance: alert, no  distress, WD/WN, female HEENT: normocephalic, sclerae anicteric, TMs pearly, nares patent, no discharge or erythema, pharynx normal Oral cavity: MMM, no lesions Neck: supple, no lymphadenopathy, no thyromegaly, no masses Heart: RRR, normal S1, S2, no murmurs Lungs: CTA bilaterally, no wheezes, rhonchi, or rales Abdomen: +bs, soft, non tender, non distended, no masses, no hepatomegaly, no splenomegaly Musculoskeletal: nontender, no swelling, no obvious deformity Extremities: no edema, no cyanosis, no clubbing Pulses: 2+ symmetric, upper and lower extremities, normal cap refill Neurological: alert, oriented x 3, CN2-12 intact, strength normal upper extremities and lower extremities left side, right side with decreased strength and spastic right hip and right hand with right ankle in brace, no cerebellar signs, gait abnormal, patient walks with cane Psychiatric: flat affect, behavior normal, pleasant    Izora Ribas, NP 10:59 AM [facility]

## 2020-03-20 ENCOUNTER — Ambulatory Visit (INDEPENDENT_AMBULATORY_CARE_PROVIDER_SITE_OTHER): Payer: Medicare Other | Admitting: Adult Health

## 2020-03-20 ENCOUNTER — Encounter: Payer: Self-pay | Admitting: Adult Health

## 2020-03-20 ENCOUNTER — Other Ambulatory Visit: Payer: Self-pay

## 2020-03-20 VITALS — BP 110/70 | HR 74 | Temp 96.1°F | Wt 146.6 lb

## 2020-03-20 DIAGNOSIS — E559 Vitamin D deficiency, unspecified: Secondary | ICD-10-CM | POA: Diagnosis not present

## 2020-03-20 DIAGNOSIS — R0989 Other specified symptoms and signs involving the circulatory and respiratory systems: Secondary | ICD-10-CM

## 2020-03-20 DIAGNOSIS — R7309 Other abnormal glucose: Secondary | ICD-10-CM | POA: Diagnosis not present

## 2020-03-20 DIAGNOSIS — G8191 Hemiplegia, unspecified affecting right dominant side: Secondary | ICD-10-CM

## 2020-03-20 DIAGNOSIS — G40409 Other generalized epilepsy and epileptic syndromes, not intractable, without status epilepticus: Secondary | ICD-10-CM | POA: Diagnosis not present

## 2020-03-20 DIAGNOSIS — Z79899 Other long term (current) drug therapy: Secondary | ICD-10-CM | POA: Diagnosis not present

## 2020-03-20 DIAGNOSIS — N1832 Chronic kidney disease, stage 3b: Secondary | ICD-10-CM | POA: Diagnosis not present

## 2020-03-20 DIAGNOSIS — E8881 Metabolic syndrome: Secondary | ICD-10-CM

## 2020-03-20 DIAGNOSIS — E782 Mixed hyperlipidemia: Secondary | ICD-10-CM | POA: Diagnosis not present

## 2020-03-20 NOTE — Patient Instructions (Addendum)
Goals    . DIET - INCREASE WATER INTAKE     4+ bottles (64 fluid ounces) daily     . Exercise 150 min/wk Moderate Activity    . LDL CALC < 100           Preventing Chronic Kidney Disease Chronic kidney disease (CKD) occurs when the kidneys are damaged for at least 3 months and do not function effectively. The kidneys are two organs that do many important jobs in the body, including:  Removing waste and extra fluid from the blood to make urine.  Making hormones that maintain the amount of fluid in tissues and blood vessels.  Maintaining the right amount of fluids and electrolytes in the body. A small amount of kidney damage may not cause problems, but a large amount of damage may make it hard or impossible for the kidneys to work the way they should. CKD gets worse over time (is progressive). You can take steps to prevent CKD or to keep it from getting worse. The best way to prevent kidney damage is to know your risk factors and make changes before you develop symptoms of CKD. How can this condition affect me? At first, you may not notice any signs or symptoms of CKD. Symptoms develop slowly and may not be obvious until the kidney damage becomes severe. If steps are not taken to prevent or slow down the disease, CKD can lead to:  A low red blood cell count (anemia).  Heart disease.  Weak bones.  Nerve damage (neuropathy).  Stroke.  Kidney failure and dialysis. What can increase my risk? You are more likely to develop CKD if you:  Are 64 years of age or older.  Are female.  Are of African American, Native American, Hispanic, Asian, or [place] descent.  Are obese.  Have taken certain medicines for a long time.  Use tobacco or have used it in the past.  Have any of the following conditions: ? Diabetes. ? High blood pressure. ? Heart disease. ? Multiple myeloma. ? An autoimmune disease. ? Frequent urinary tract infections. ? Polycystic kidney  disease.  Have a family history of kidney disease, heart disease, diabetes, or high blood pressure.  Have problems with urine flow that may be caused by: ? Cancer. ? Having kidney stones more than once. ? An enlarged prostate in males. What actions can I take to prevent CKD? Managing conditions that put you at risk  Talk to your health care provider about your kidney health and your risk factors for CKD.  Work with your health care provider to manage conditions such as high blood pressure or diabetes. This may involve taking medicines, eating healthy, or making lifestyle changes to: ? Get high blood pressure down to the target that your health care provider recommends. ? Get blood sugar (glucose) levels down to the target that your health care provider recommends. Eating and drinking   Follow instructions from your health care provider about diet. This may include: ? Limiting salt (sodium) intake. You should have less than 1 tsp (2,300 mg) of sodium a day. If you have heart disease or high blood pressure, you should have less than  tsp (1,500 mg) of sodium a day. ? Limiting protein intake as told by your health care provider. Avoid high-protein foods. ? Eating a balanced, heart-healthy diet. ? Avoiding foods that are high in potassium and phosphorous.  Limit alcohol. If you drink alcohol: ? Limit how much you use to:  0-1  drink a day for nonpregnant women.  0-2 drinks a day for men. ? Be aware of how much alcohol is in your drink. In the [place] one drink equals one 12 oz bottle of beer (355 mL), one 5 oz glass of wine (148 mL), or one 1 oz glass of hard liquor (44 mL).  If you have diabetes, work with a Financial planner (Firefighter) or a certified diabetes educator to develop a healthy eating plan.  Talk with your health care provider about how much fluid you should drink each day. Lifestyle   Exercise for at least 30 minutes on 5 or more days of the week, or as  much as told by your health care provider.  Keep your weight at a healthy level. If you are overweight or obese, lose weight as told by your health care provider.  Do not use any products that contain nicotine or tobacco, such as cigarettes, e-cigarettes, and chewing tobacco. If you need help quitting, ask your health care provider. General instructions  Take over-the-counter and prescription medicines only as told by your health care provider. Do not take any new medicines unless approved by your health care provider.  Use NSAIDs for pain only when necessary. Ask your health care provider about other pain medicines that do not increase your risk of developing CKD.  Have a yearly physical exam.  Learn about your family's medical history. Talk to your relatives and siblings about diabetes, heart disease, and high blood pressure. Where to find more information Learn more about CKD and how to prevent CKD from:  [place]: www.kidney.org  American Association of Kidney Patients: BombTimer.gl  American Diabetes Association: www.diabetes.org Summary  Symptoms of CKD develop slowly and may not be obvious until the kidney damage becomes severe.  The best way to prevent kidney damage is to know your risk factors and make nutrition and lifestyle changes before you develop symptoms of CKD.  Follow instructions from your health care provider about diet, which may include limiting how much salt, protein, and alcohol you consume.  Work with your health care provider to keep your blood pressure and blood sugar levels within the recommended range. This information is not intended to replace advice given to you by your health care provider. Make sure you discuss any questions you have with your health care provider. Document Revised: 08/11/2018 Document Reviewed: 05/24/2018 Elsevier Patient Education  [place].

## 2020-03-21 ENCOUNTER — Encounter: Payer: Self-pay | Admitting: Adult Health

## 2020-03-21 LAB — CBC WITH DIFFERENTIAL/PLATELET
Absolute Monocytes: 497 cells/uL (ref 200–950)
Basophils Absolute: 32 cells/uL (ref 0–200)
Basophils Relative: 0.6 %
Eosinophils Absolute: 49 cells/uL (ref 15–500)
Eosinophils Relative: 0.9 %
HCT: 41.1 % (ref 35.0–45.0)
Hemoglobin: 13.5 g/dL (ref 11.7–15.5)
Lymphs Abs: 1771 cells/uL (ref 850–3900)
MCH: 30.6 pg (ref 27.0–33.0)
MCHC: 32.8 g/dL (ref 32.0–36.0)
MCV: 93.2 fL (ref 80.0–100.0)
MPV: 10.3 fL (ref 7.5–12.5)
Monocytes Relative: 9.2 %
Neutro Abs: 3051 cells/uL (ref 1500–7800)
Neutrophils Relative %: 56.5 %
Platelets: 318 10*3/uL (ref 140–400)
RBC: 4.41 10*6/uL (ref 3.80–5.10)
RDW: 11.8 % (ref 11.0–15.0)
Total Lymphocyte: 32.8 %
WBC: 5.4 10*3/uL (ref 3.8–10.8)

## 2020-03-21 LAB — COMPLETE METABOLIC PANEL WITH GFR
AG Ratio: 1.7 (calc) (ref 1.0–2.5)
ALT: 19 U/L (ref 6–29)
AST: 22 U/L (ref 10–35)
Albumin: 4.4 g/dL (ref 3.6–5.1)
Alkaline phosphatase (APISO): 107 U/L (ref 37–153)
BUN/Creatinine Ratio: 21 (calc) (ref 6–22)
BUN: 28 mg/dL — ABNORMAL HIGH (ref 7–25)
CO2: 28 mmol/L (ref 20–32)
Calcium: 10.4 mg/dL (ref 8.6–10.4)
Chloride: 108 mmol/L (ref 98–110)
Creat: 1.32 mg/dL — ABNORMAL HIGH (ref 0.50–0.99)
GFR, Est African American: 50 mL/min/{1.73_m2} — ABNORMAL LOW (ref >=60)
GFR, Est Non African American: 43 mL/min/{1.73_m2} — ABNORMAL LOW (ref >=60)
Globulin: 2.6 g/dL (calc) (ref 1.9–3.7)
Glucose, Bld: 79 mg/dL (ref 65–99)
Potassium: 3.9 mmol/L (ref 3.5–5.3)
Sodium: 143 mmol/L (ref 135–146)
Total Bilirubin: 0.3 mg/dL (ref 0.2–1.2)
Total Protein: 7 g/dL (ref 6.1–8.1)

## 2020-03-21 LAB — TSH: TSH: 2.1 mIU/L (ref 0.40–4.50)

## 2020-03-21 LAB — INSULIN, RANDOM: Insulin: 5.7 u[IU]/mL

## 2020-03-21 LAB — LIPID PANEL
Cholesterol: 190 mg/dL (ref <200)
HDL: 42 mg/dL — ABNORMAL LOW (ref >=50)
LDL Cholesterol (Calc): 116 mg/dL (calc) — ABNORMAL HIGH
Non-HDL Cholesterol (Calc): 148 mg/dL (calc) — ABNORMAL HIGH (ref <130)
Total CHOL/HDL Ratio: 4.5 (calc) (ref <5.0)
Triglycerides: 202 mg/dL — ABNORMAL HIGH (ref <150)

## 2020-03-24 ENCOUNTER — Telehealth: Payer: Self-pay | Admitting: *Deleted

## 2020-03-24 NOTE — Telephone Encounter (Signed)
Order for a Covid 72 tested faxed to [facility], due to possible Covid exposure. A message was left to inform the patient.

## 2020-05-26 ENCOUNTER — Other Ambulatory Visit: Payer: Self-pay | Admitting: Internal Medicine

## 2020-05-26 DIAGNOSIS — Z1231 Encounter for screening mammogram for malignant neoplasm of breast: Secondary | ICD-10-CM

## 2020-06-03 ENCOUNTER — Ambulatory Visit: Payer: Medicare Other | Admitting: Physician Assistant

## 2020-06-10 ENCOUNTER — Telehealth: Payer: Self-pay | Admitting: *Deleted

## 2020-06-10 NOTE — Telephone Encounter (Signed)
Received call from Helene Kelp at [place] the office that they do not do Covid test on assisted living residents. Left a message to inform the patient and advised her to ask her emergency contact to buy a home test and deliver it to her. She can do the test and ask the nurse in her area for assistence, if needed.

## 2020-06-10 NOTE — Telephone Encounter (Signed)
ent exposed to Covid and order faxed to [facility] for patient to have a test.

## 2020-06-23 ENCOUNTER — Ambulatory Visit: Payer: Medicare Other | Admitting: Adult Health

## 2020-06-26 ENCOUNTER — Other Ambulatory Visit: Payer: Self-pay

## 2020-06-26 ENCOUNTER — Ambulatory Visit (INDEPENDENT_AMBULATORY_CARE_PROVIDER_SITE_OTHER): Payer: Medicare Other | Admitting: Adult Health

## 2020-06-26 ENCOUNTER — Encounter: Payer: Self-pay | Admitting: Adult Health

## 2020-06-26 VITALS — BP 114/68 | HR 71 | Temp 97.3°F | Wt 150.0 lb

## 2020-06-26 DIAGNOSIS — D2272 Melanocytic nevi of left lower limb, including hip: Secondary | ICD-10-CM | POA: Diagnosis not present

## 2020-06-26 DIAGNOSIS — R7309 Other abnormal glucose: Secondary | ICD-10-CM

## 2020-06-26 DIAGNOSIS — Z0000 Encounter for general adult medical examination without abnormal findings: Secondary | ICD-10-CM

## 2020-06-26 DIAGNOSIS — R519 Headache, unspecified: Secondary | ICD-10-CM | POA: Diagnosis not present

## 2020-06-26 DIAGNOSIS — Z0001 Encounter for general adult medical examination with abnormal findings: Secondary | ICD-10-CM

## 2020-06-26 DIAGNOSIS — N1832 Chronic kidney disease, stage 3b: Secondary | ICD-10-CM

## 2020-06-26 DIAGNOSIS — G8191 Hemiplegia, unspecified affecting right dominant side: Secondary | ICD-10-CM

## 2020-06-26 DIAGNOSIS — Z86010 Personal history of colonic polyps: Secondary | ICD-10-CM | POA: Insufficient documentation

## 2020-06-26 DIAGNOSIS — R6889 Other general symptoms and signs: Secondary | ICD-10-CM

## 2020-06-26 DIAGNOSIS — D2271 Melanocytic nevi of right lower limb, including hip: Secondary | ICD-10-CM | POA: Diagnosis not present

## 2020-06-26 DIAGNOSIS — Z87442 Personal history of urinary calculi: Secondary | ICD-10-CM

## 2020-06-26 DIAGNOSIS — Z79899 Other long term (current) drug therapy: Secondary | ICD-10-CM

## 2020-06-26 DIAGNOSIS — R0989 Other specified symptoms and signs involving the circulatory and respiratory systems: Secondary | ICD-10-CM

## 2020-06-26 DIAGNOSIS — G40409 Other generalized epilepsy and epileptic syndromes, not intractable, without status epilepticus: Secondary | ICD-10-CM | POA: Diagnosis not present

## 2020-06-26 DIAGNOSIS — M8580 Other specified disorders of bone density and structure, unspecified site: Secondary | ICD-10-CM

## 2020-06-26 DIAGNOSIS — D2262 Melanocytic nevi of left upper limb, including shoulder: Secondary | ICD-10-CM | POA: Diagnosis not present

## 2020-06-26 DIAGNOSIS — D225 Melanocytic nevi of trunk: Secondary | ICD-10-CM | POA: Diagnosis not present

## 2020-06-26 DIAGNOSIS — D2261 Melanocytic nevi of right upper limb, including shoulder: Secondary | ICD-10-CM | POA: Diagnosis not present

## 2020-06-26 DIAGNOSIS — E782 Mixed hyperlipidemia: Secondary | ICD-10-CM | POA: Diagnosis not present

## 2020-06-26 DIAGNOSIS — G40219 Localization-related (focal) (partial) symptomatic epilepsy and epileptic syndromes with complex partial seizures, intractable, without status epilepticus: Secondary | ICD-10-CM | POA: Diagnosis not present

## 2020-06-26 DIAGNOSIS — E559 Vitamin D deficiency, unspecified: Secondary | ICD-10-CM

## 2020-06-26 NOTE — Progress Notes (Signed)
MEDICARE ANNUAL WELLNESS VISIT AND FOLLOW UP  Assessment:   Encounter for Medicare annual wellness exam 1 year  Epilepsy, grand mal ([place]) Followed by Dr. Marlou Sa - requesting referral for local provider Referral placed   Partial symptomatic epilepsy with complex partial seizures, intractable, without status epilepticus ([place]) Followed by Dr. Marlou Sa - referral placed for new provider Continue topamax; last seizure remote  Right hemiparesis ([place]) Continue with cane, PT as needed No falls this year   CKD (chronic kidney disease) stage 3  (HCC) -     COMPLETE METABOLIC PANEL WITH GFR Increase fluids, follow up urology, we are monitoring labs closely, nephrology didn't recommend further follow up  Labile hypertension -     CBC with Differential/Platelet -     COMPLETE METABOLIC PANEL WITH GFR -     TSH - continue medications, DASH diet, exercise and monitor at home. Call if greater than 130/80.   Abnormal glucose Discussed disease progression and risks Discussed diet/exercise, weight management and risk modification  Hyperlipidemia, mixed -     Lipid panel check lipids decrease fatty foods increase activity.   Headache, unspecified headache type Monitor, improved  Vitamin D deficiency Continue supplement  Medication management -     Magnesium  Insulin resistance Discussed disease progression and risks Discussed diet/exercise, weight management and risk modification  Osteopenia, unspecified location Osteopenia- get dexa 2023, continue Vit D and Ca, weight bearing exercises  History of kidney stone S/p removal by Dr. Alinda Money; he will follow up annually  No concerns; push water intake  History of colon polyps  due for follow up- GI referral placed   Over 40 minutes of exam, counseling, chart review and critical decision making was performed Future Appointments  Date Time Provider [place]  07/07/2020  2:40 PM [facility] 3 [facility]  09/22/2020   2:00 PM Liane Comber, NP [facility] None  06/26/2021  9:00 AM Liane Comber, NP [facility] None     Plan:   During the course of the visit the patient was educated and counseled about appropriate screening and preventive services including:    Pneumococcal vaccine   Prevnar 13  Influenza vaccine  Td vaccine  Screening electrocardiogram  Bone densitometry screening  Colorectal cancer screening  Diabetes screening  Glaucoma screening  Nutrition counseling   Advanced directives: requested   Subjective:  Tonya Thompson is a 65 y.o. female who presents for Medicare Annual Wellness Visit and 3 month follow up.   She reports 2 days ago had 1 episode of scant red blood on toilet paper; none since; known hx of internal hemorroids on last colonoscopy 07/2015, incidentally she is also due for follow up, requesting referral to local provider.   Follows with Dr. 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 Patient has residual R hemiparesis. Her last seizure reportedly was circa 1988. She is on topamax.    She moved into friends home Aug 2021, her father, uncle and cousin are there. Very happy there. [place] is provided. She walks with a cane, no falls, she does her own mediations, no issues with food, some meals provided. Wears hearing aids.   BMI is Body mass index is 24.21 kg/m., she has not been working on diet and exercise, but plans to restart  a stretching/muscle training regimen.  Wt Readings from Last 3 Encounters:  06/26/20 150 lb (68 kg)  03/20/20 146 lb 9.6 oz (66.5 kg)  12/17/19 148 lb 12.8 oz (67.5 kg)   Her blood pressure has not been controlled at home, today their BP is BP: 114/68  She does not workout. She denies chest pain, shortness of breath, dizziness.   She is not on cholesterol  medication, taking omega 3 supplement only and denies myalgias. Her cholesterol is not at goal. The cholesterol last visit was:   Lab Results  Component Value Date   CHOL 190 03/20/2020   HDL 42 (L) 03/20/2020   LDLCALC 116 (H) 03/20/2020   TRIG 202 (H) 03/20/2020   CHOLHDL 4.5 03/20/2020    She has been working on diet and exercise for glucose management, and denies polydipsia and polyuria. Last A1C in the office was:  Lab Results  Component Value Date   HGBA1C 5.0 09/04/2019   She saw nephrology in 04/2019 for worsening Kidney function, went from GFR 56 in 08/2018 to GFR of 26/27 in 02/2019, had US renal and CT AB, had stone removal with [facility] Jan 7th, has follow up planned with Dr. Alinda Money December 10 2020.  Lab Results  Component Value Date   GFRNONAA 43 (L) 03/20/2020   GFRNONAA 43 (L) 12/17/2019   GFRNONAA 40 (L) 09/04/2019   Patient is on Vitamin D supplement.   Lab Results  Component Value Date   VD25OH 60 12/17/2019      Medication Review:    Current Outpatient Medications (Respiratory):  .  fexofenadine (ALLEGRA) 180 MG tablet, Take 180 mg by mouth daily as needed for allergies.   Current Outpatient Medications (Analgesics):  .  butalbital-acetaminophen-caffeine (FIORICET, ESGIC) 50-325-40 MG tablet, Take 1 tablet by mouth 2 (two) times daily as needed for headache or migraine.    Current Outpatient Medications (Other):  .  baclofen (LIORESAL) 10 MG tablet, Take 10 mg by mouth at bedtime.  .  calcium carbonate (TUMS - DOSED IN MG ELEMENTAL CALCIUM) 500 MG chewable tablet, Chew 1 tablet by mouth daily. .  Calcium Citrate (CITRACAL PO), Take 1 Dose by mouth daily. .  Calcium Citrate-Vitamin D (CALCIUM + D PO), Take 1 tablet by mouth daily.  .  Multiple Vitamins-Minerals (MULTIVITAMIN WITH MINERALS) tablet, Take 1 tablet by mouth daily.  .  Omega-3 Fatty Acids (FISH OIL PO), Take 1 capsule by mouth daily. Marland Kitchen  POTASSIUM PO, Take 1 tablet by mouth daily. Marland Kitchen   Propylene Glycol (SYSTANE BALANCE) 0.6 % SOLN, Place 1 drop into both eyes 2 (two) times daily. Marland Kitchen  topiramate (TOPAMAX) 200 MG tablet, Takes 1 &1/2 tabs 2 x /day    (3 tabs /day) (Patient taking differently: Take 300 mg by mouth 2 (two) times daily. MD wants her to only take the American made Zy Topamax) .  Vitamin D3 (VITAMIN D) 25 MCG tablet, Take 1,000 Units by mouth daily.    Allergies  Allergen Reactions  . Aspirin Other (See Comments)    CONTRAINDICATED DUE TO HISTORY OF BLEEDING IN BRAIN  . Cheese     Cant take due to history of severe migraines  . Chocolate     Cant take due to history of severe migraines  . Coffee Flavor     Cant take due to history of severe migraines  . Other      Nuts - Cant take due to history of severe migraines  . Red Wine  Complex [Germanium]     Cant take due to history of severe migraines    Current Problems (verified) Patient Active Problem List   Diagnosis Date Noted  . History of renal stone 09/04/2019  . Medication management 02/16/2018  . Osteopenia 01/10/2018  . CKD (chronic kidney disease) stage 3, GFR 30-59 ml/min (HCC) 11/10/2017  . 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 Hyperlipidemia, mixed 06/01/2017  . Abnormal glucose 06/01/2017  . Vitamin D deficiency 06/01/2017  . Epilepsy, grand mal ([place]) 11/15/2016  . 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 ([place]) 11/15/2016  . Headache, unspecified headache type 04/14/2016  . Right hemiparesis ([place]) 12/29/2015    Screening Tests Immunization History  Administered Date(s) Administered  . Influenza Inj Mdck Quad Pf 02/16/2017  . Influenza Inj Mdck Quad With Preservative 02/16/2018, 03/01/2019  . Influenza,inj,Quad PF,6+ Mos 03/01/2017  . Influenza-Unspecified 03/01/2017, 12/17/2019  . PFIZER(Purple Top)SARS-COV-2 Vaccination 07/22/2019, 08/12/2019, 03/10/2020  . Pneumococcal-Unspecified 05/03/2004  . Td 10/19/2019  . Tdap 08/11/2009  . Zoster 04/14/2012   Preventative  care: Last [facility]: [facility], due 5 years, will need referral to local provider  Last mammogram: 07/2019, has scheduled 07/08/2019 Last pap smear/pelvic exam: 12/2017 DONE DEXA: 01/2018 L fem T -1.1 schedule 2023  Prior vaccinations: TD or Tdap: 2011, due will defer unless cut due to cost  Influenza: 02/2020  Pneumococcal: 2006 Prevnar13: due age 32 Shingles/Zostavax: 2013 Covid 19: 3/3, pfizer, 2021  Names of Other Physician/Practitioners you currently use: 1. [facility] here for primary care 2. [facility], eye doctor, last visit 12/2019 3. Dr. Conley Canal, dentist, last visit 08/21/2019, has scheduled 09/09/2020 4. Dr. Ronnald Ramp @ [facility] at [address] [place], derm, TODAY - 06/26/2020 5. Dr. Adolphus Birchwood, neurology, last visit 2019, goes q2 years - will refer for local provider   Patient Care Team: Unk Pinto, MD as PCP - General (Internal Medicine)  SURGICAL HISTORY She  has a past surgical history that includes Brain surgery (09/19/1978); Foot surgery (Right, 10/1985); Tubal ligation; and Cystoscopy/ureteroscopy/holmium laser/stent placement (Left, 05/10/2019). FAMILY HISTORY Her family history includes CAD in her maternal uncle; COPD in her maternal uncle; Cancer in her father; Dementia in her father and paternal uncle; Diabetes in her father; Heart attack in her maternal grandfather and mother; Heart disease in her mother; Heart failure in her mother; Hyperlipidemia in her father and paternal uncle; Hypertension in her paternal uncle; Stroke in her paternal grandmother. SOCIAL HISTORY She  reports that she has never smoked. She has never used smokeless tobacco. She reports that she does not drink alcohol and does not use drugs.   MEDICARE WELLNESS OBJECTIVES: Physical activity:   Cardiac risk factors:   Depression/mood screen:   Depression screen [facility] 2/9 12/17/2019  Decreased Interest 0   Down, Depressed, Hopeless 0  PHQ - 2 Score 0  Altered sleeping -  Tired, decreased energy -  Change in appetite -  Feeling bad or failure about yourself  -  Trouble concentrating -  Moving slowly or fidgety/restless -  Suicidal thoughts -  PHQ-9 Score -  Difficult doing work/chores -    ADLs:  In your present state of health, do you have any difficulty performing the following activities: 12/17/2019  Hearing? N  Vision? N  Difficulty concentrating or making decisions? N  Walking or climbing stairs? N  Dressing or bathing? N  Doing errands, shopping? N  Some recent data might be hidden     Cognitive  Testing  Alert? Yes  Normal Appearance?Yes  Oriented to person? Yes  Place? Yes   Time? Yes  Recall of three objects?  Yes  Can perform simple calculations? Yes  Displays appropriate judgment?Yes  Can read the correct time from a watch face?Yes  EOL planning:    Review of Systems  Constitutional: Negative for malaise/fatigue and weight loss.  HENT: Negative for hearing loss and tinnitus.   Eyes: Negative for blurred vision and double vision.  Respiratory: Negative for cough, sputum production, shortness of breath and wheezing.   Cardiovascular: Negative for chest pain, palpitations, orthopnea, claudication, leg swelling and PND.  Gastrointestinal: Negative for abdominal pain, blood in stool, constipation, diarrhea, heartburn, melena, nausea and vomiting.  Genitourinary: Negative.   Musculoskeletal: Negative for falls, joint pain and myalgias.  Skin: Negative for rash.  Neurological: Negative for dizziness, tingling, sensory change, weakness and headaches.  Endo/Heme/Allergies: Negative for polydipsia.  Psychiatric/Behavioral: Negative.  Negative for depression, memory loss, substance abuse and suicidal ideas. The patient is not nervous/anxious and does not have insomnia.   All other systems reviewed and are negative.   Objective:     Today's Vitals   06/26/20 0903   BP: 114/68  Pulse: 71  Temp: (!) 97.3 F (36.3 C)  SpO2: 96%  Weight: 150 lb (68 kg)   Body mass index is 24.21 kg/m.  General appearance: alert, no distress, WD/WN, female HEENT: normocephalic, sclerae anicteric, TMs pearly, nares patent, no discharge or erythema, pharynx normal Oral cavity: MMM, no lesions Neck: supple, no lymphadenopathy, no thyromegaly, no masses Heart: RRR, normal S1, S2, no murmurs Lungs: CTA bilaterally, no wheezes, rhonchi, or rales Abdomen: +bs, soft, non tender, non distended, no masses, no hepatomegaly, no splenomegaly Musculoskeletal: nontender, no swelling, no obvious deformity Extremities: no edema, no cyanosis, no clubbing Pulses: 2+ symmetric, upper and lower extremities, normal cap refill Neurological: alert, oriented x 3, CN2-12 intact, strength normal upper extremities and lower extremities left side, right side with decreased strength and spastic right hip and right hand with right ankle in brace, no cerebellar signs, gait abnormal, patient walks with cane Psychiatric: flat affect, behavior normal, pleasant   Medicare Attestation I have personally reviewed: The patient's medical and social history Their use of alcohol, tobacco or illicit drugs Their current medications and supplements The patient's functional ability including ADLs,fall risks, home safety risks, cognitive, and hearing and visual impairment Diet and physical activities Evidence for depression or mood disorders  The patient's weight, height, BMI, and visual acuity have been recorded in the chart.  I have made referrals, counseling, and provided education to the patient based on review of the above and I have provided the patient with a written personalized care plan for preventive services.     Izora Ribas, NP   06/26/2020

## 2020-06-26 NOTE — Patient Instructions (Signed)
  Ms. Cawthorn , Thank you for taking time to come for your Medicare Wellness Visit. I appreciate your ongoing commitment to your health goals. Please review the following plan we discussed and let me know if I can assist you in the future.   These are the goals we discussed: Goals    . DIET - INCREASE WATER INTAKE     4+ bottles (64 fluid ounces) daily     . Exercise 150 min/wk Moderate Activity    . LDL CALC < 100       This is a list of the screening recommended for you and due dates:  Health Maintenance  Topic Date Due  . Colon Cancer Screening  07/01/2020  . Pap Smear  12/06/2020  . Mammogram  07/02/2021  . Tetanus Vaccine  10/18/2029  . Flu Shot  Completed  . COVID-19 Vaccine  Completed  .  Hepatitis C: One time screening is recommended by Center for Disease Control  (CDC) for  adults born from [birth year] through 1963.   Discontinued  . HIV Screening  Discontinued

## 2020-06-27 LAB — CBC WITH DIFFERENTIAL/PLATELET
Absolute Monocytes: 419 cells/uL (ref 200–950)
Basophils Absolute: 48 cells/uL (ref 0–200)
Basophils Relative: 0.9 %
Eosinophils Absolute: 42 cells/uL (ref 15–500)
Eosinophils Relative: 0.8 %
HCT: 39.7 % (ref 35.0–45.0)
Hemoglobin: 13.3 g/dL (ref 11.7–15.5)
Lymphs Abs: 1595 cells/uL (ref 850–3900)
MCH: 31.4 pg (ref 27.0–33.0)
MCHC: 33.5 g/dL (ref 32.0–36.0)
MCV: 93.9 fL (ref 80.0–100.0)
MPV: 10.7 fL (ref 7.5–12.5)
Monocytes Relative: 7.9 %
Neutro Abs: 3196 cells/uL (ref 1500–7800)
Neutrophils Relative %: 60.3 %
Platelets: 244 10*3/uL (ref 140–400)
RBC: 4.23 10*6/uL (ref 3.80–5.10)
RDW: 12 % (ref 11.0–15.0)
Total Lymphocyte: 30.1 %
WBC: 5.3 10*3/uL (ref 3.8–10.8)

## 2020-06-27 LAB — COMPLETE METABOLIC PANEL WITH GFR
AG Ratio: 1.5 (calc) (ref 1.0–2.5)
ALT: 13 U/L (ref 6–29)
AST: 16 U/L (ref 10–35)
Albumin: 4.2 g/dL (ref 3.6–5.1)
Alkaline phosphatase (APISO): 114 U/L (ref 37–153)
BUN/Creatinine Ratio: 22 (calc) (ref 6–22)
BUN: 31 mg/dL — ABNORMAL HIGH (ref 7–25)
CO2: 28 mmol/L (ref 20–32)
Calcium: 10.2 mg/dL (ref 8.6–10.4)
Chloride: 108 mmol/L (ref 98–110)
Creat: 1.38 mg/dL — ABNORMAL HIGH (ref 0.50–0.99)
GFR, Est African American: 47 mL/min/{1.73_m2} — ABNORMAL LOW (ref >=60)
GFR, Est Non African American: 41 mL/min/{1.73_m2} — ABNORMAL LOW (ref >=60)
Globulin: 2.8 g/dL (calc) (ref 1.9–3.7)
Glucose, Bld: 79 mg/dL (ref 65–99)
Potassium: 3.9 mmol/L (ref 3.5–5.3)
Sodium: 142 mmol/L (ref 135–146)
Total Bilirubin: 0.3 mg/dL (ref 0.2–1.2)
Total Protein: 7 g/dL (ref 6.1–8.1)

## 2020-06-27 LAB — LIPID PANEL
Cholesterol: 205 mg/dL — ABNORMAL HIGH (ref <200)
HDL: 44 mg/dL — ABNORMAL LOW (ref >=50)
LDL Cholesterol (Calc): 126 mg/dL (calc) — ABNORMAL HIGH
Non-HDL Cholesterol (Calc): 161 mg/dL (calc) — ABNORMAL HIGH (ref <130)
Total CHOL/HDL Ratio: 4.7 (calc) (ref <5.0)
Triglycerides: 201 mg/dL — ABNORMAL HIGH (ref <150)

## 2020-06-27 LAB — MAGNESIUM: Magnesium: 2.1 mg/dL (ref 1.5–2.5)

## 2020-06-27 LAB — TSH: TSH: 2.23 mIU/L (ref 0.40–4.50)

## 2020-06-30 ENCOUNTER — Encounter: Payer: Self-pay | Admitting: Neurology

## 2020-07-07 ENCOUNTER — Inpatient Hospital Stay: Admission: RE | Admit: 2020-07-07 | Payer: Medicare Other | Source: Ambulatory Visit

## 2020-07-07 ENCOUNTER — Telehealth: Payer: Self-pay

## 2020-07-07 NOTE — Telephone Encounter (Signed)
Patient is needing an AFO brace. Went to [employer]otech and it's a prosthetic that she is needing. Please advise.

## 2020-07-17 ENCOUNTER — Telehealth: Payer: Self-pay | Admitting: Gastroenterology

## 2020-07-17 ENCOUNTER — Encounter: Payer: Self-pay | Admitting: Gastroenterology

## 2020-07-17 NOTE — Telephone Encounter (Signed)
Patient scheduled for 07/23/20 and 08/06/20.

## 2020-07-17 NOTE — Telephone Encounter (Signed)
Okay to direct book for colonoscopy at the [facility] if she meets criteria, she is due for surveillance, multiple adenomas removed 5 years ago. Thanks

## 2020-07-17 NOTE — Telephone Encounter (Signed)
Good morning Dr. Havery Moros, we received a referral for patient to have a colonoscopy.  She had a previous one in 2017.  Records are in Epic.  Can you please review and advise on scheduling?  Thank you.

## 2020-07-18 ENCOUNTER — Other Ambulatory Visit: Payer: Self-pay | Admitting: Adult Health

## 2020-07-18 ENCOUNTER — Encounter: Payer: Self-pay | Admitting: Adult Health

## 2020-07-18 MED ORDER — PREDNISONE 20 MG PO TABS: ORAL_TABLET | ORAL | 0 refills | Status: DC

## 2020-07-18 NOTE — Progress Notes (Signed)
Patient reporting frontal headache ongoing for 2-3 days poorly responsive to fioricet, ice pack without benefit; steroid taper has worked for similar in the past, in process of changing neurologists, requesting if we would prescribe. Denies fever/chills, vision changes, dizziness, weakness. Denies congestion, rhinitis, cough or other URI sx. Will send in short prednisone taper. Encouraged UC/ED evaluation if not improving over the weekend of if any vision changes, weakness, worsening HA.

## 2020-07-21 ENCOUNTER — Other Ambulatory Visit: Payer: Self-pay

## 2020-07-21 ENCOUNTER — Encounter: Payer: Self-pay | Admitting: Internal Medicine

## 2020-07-21 ENCOUNTER — Ambulatory Visit (INDEPENDENT_AMBULATORY_CARE_PROVIDER_SITE_OTHER): Payer: Medicare Other | Admitting: Internal Medicine

## 2020-07-21 VITALS — BP 124/80 | HR 74 | Temp 97.3°F | Resp 16 | Ht 66.0 in | Wt 149.8 lb

## 2020-07-21 DIAGNOSIS — M26623 Arthralgia of bilateral temporomandibular joint: Secondary | ICD-10-CM

## 2020-07-21 DIAGNOSIS — G44209 Tension-type headache, unspecified, not intractable: Secondary | ICD-10-CM | POA: Diagnosis not present

## 2020-07-21 MED ORDER — DEXAMETHASONE 4 MG PO TABS: ORAL_TABLET | ORAL | 0 refills | Status: DC

## 2020-07-21 NOTE — Progress Notes (Signed)
History of Present Illness:     Patient is a very nice 65 y.o. single WF with HTN, HLD, hx/o Seizure Disorder, Pre-Diabetes, seizure disorder and Vitamin D Deficiency who presents with a 5 day hx/o HA which she describes a bi-temperofrontal & vertical and very tender to light touch or pressure. No sinus congestion, but does hurt in her TM jt areas when she chews or "grits " her teeth. No N/V, visual sx's or neuro signs or sx's    Medications  .  fexofenadine (ALLEGRA) 180 MG tablet, Take 180 mg by mouth daily as needed for allergies.   .  butalbital-acetaminophen-caffeine (FIORICET, ESGIC) 50-325-40 MG tablet, Take 1 tablet by mouth 2 (two) times daily as needed for headache or migraine.   .  baclofen (LIORESAL) 10 MG tablet, Take 10 mg by mouth at bedtime.   .  TUMS 500 MG chewable tablet, Chew 1 tablet by mouth daily.  Marland Kitchen  CITRACAL, Take 1 Dose by mouth daily.  Marland Kitchen  CALCIUM + D , Take 1 tablet by mouth daily.   .  Multiple Vitamins-Minerals , Take 1 tablet by mouth daily.   .  Omega-3 Fatty Acids (FISH OIL PO), Take 1 capsule by mouth daily.  Marland Kitchen  POTASSIUM PO, Take 1 tablet by mouth daily.  Marland Kitchen  Propylene Glycol (SYSTANE BALANCE) 0.6 % SOLN, Place 1 drop into both eyes 2 (two) times daily.  Marland Kitchen  topiramate (TOPAMAX) 200 MG tablet, Takes 1 &1/2 tabs 2 x /day    .  Vitamin D3 (VITAMIN D) 25 MCG tablet, Take 1,000 Units by mouth daily.   Problem list She has Epilepsy, grand mal ([place]); 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 ([place]); Headache, unspecified headache type; Right hemiparesis ([place]); Labile hypertension; Hyperlipidemia, mixed; Abnormal glucose; Vitamin D deficiency; CKD (chronic kidney disease) stage 3, GFR 30-59 ml/min ([place]); Osteopenia; Medication management; History of renal stone; and History of colon polyps on their problem list.   Observations/Objective:   BP 124/80   Pulse 74   Temp (!) 97.3 F (36.3 C)   Resp 16   Ht 5\' 6"  (1.676 m)   Wt 149 lb  12.8 oz (67.9 kg)   SpO2 99%   BMI 24.18 kg/m   HEENT -  EAC's / TM's - Nl . Bilat exquisite tender TM jts. PERRLA/EPOM's conjugate.  N/O/P - Clear. Exquisite tenderness of forehead, temples  and frontal/parietal scalp Neck - supple.  Chest - Clear equal BS. Cor - Nl HS. RRR w/o sig MGR. PP 1(+). No edema. MS- FROM w/o deformities.  Gait Nl. Neuro -  Nl w/o focal abnormalities.  Assessment and Plan:  1. Tension-type headache, not intractable, unspecified chronicity pattern  - dexamethasone (DECADRON) 4 MG tablet; Take 1 tab 3 x day - 3 days, then 2 x day - 3 days, then 1 tab daily  Dispense: 30 tablet; Refill: 0  2. Bilateral temporomandibular joint pain  - dexamethasone (DECADRON) 4 MG tablet; Take 1 tab 3 x day - 3 days, then 2 x day - 3 days, then 1 tab daily  Dispense: 30 tablet; Refill: 0  - Ok'd to continue using ice packs.   Follow Up Instructions:      I discussed the assessment and treatment plan with the patient. The patient was provided an opportunity to ask questions and all were answered. The patient agreed with the plan and demonstrated an understanding of the instructions.       The patient was advised  to call back or seek an in-person evaluation if the symptoms worsen or if the condition fails to improve as anticipated.   Kirtland Bouchard, MD

## 2020-07-23 ENCOUNTER — Ambulatory Visit (AMBULATORY_SURGERY_CENTER): Payer: Self-pay

## 2020-07-23 ENCOUNTER — Other Ambulatory Visit: Payer: Self-pay

## 2020-07-23 VITALS — Ht 66.0 in | Wt 149.8 lb

## 2020-07-23 DIAGNOSIS — Z86010 Personal history of colonic polyps: Secondary | ICD-10-CM

## 2020-07-23 NOTE — Progress Notes (Signed)
No egg or soy allergy known to patient  No issues with past sedation with any surgeries or procedures Patient denies ever being told they had issues or difficulty with intubation  No FH of Malignant Hyperthermia No diet pills per patient No home 02 use per patient  No blood thinners per patient  Pt denies issues with constipation -- patient was advised to increase fluid intake due to list of medications she reports at times gives her constipation; No A fib or A flutter  EMMI video via MyChart  COVID 19 guidelines implemented in PV today with Pt and RN  Pt is fully vaccinated for COVID x 2 + booster; Coupon given to pt in PV today,  NO PA's for preps discussed with pt in PV today  Discussed with pt there will be an out-of-pocket cost for prep and that varies from $0 to 70 dollars  Due to the COVID-19 pandemic we are asking patients to follow certain guidelines.    Pt aware of COVID protocols and [facility] guidelines

## 2020-07-24 ENCOUNTER — Encounter (HOSPITAL_BASED_OUTPATIENT_CLINIC_OR_DEPARTMENT_OTHER): Payer: Self-pay

## 2020-07-24 ENCOUNTER — Other Ambulatory Visit: Payer: Self-pay

## 2020-07-24 ENCOUNTER — Other Ambulatory Visit: Payer: Self-pay | Admitting: Internal Medicine

## 2020-07-24 ENCOUNTER — Emergency Department (HOSPITAL_BASED_OUTPATIENT_CLINIC_OR_DEPARTMENT_OTHER): Payer: Medicare Other

## 2020-07-24 ENCOUNTER — Emergency Department (HOSPITAL_BASED_OUTPATIENT_CLINIC_OR_DEPARTMENT_OTHER)
Admission: EM | Admit: 2020-07-24 | Discharge: 2020-07-24 | Disposition: A | Discharge status: Home / Self Care | Payer: Medicare Other | Attending: Emergency Medicine | Admitting: Emergency Medicine

## 2020-07-24 DIAGNOSIS — R519 Headache, unspecified: Secondary | ICD-10-CM | POA: Diagnosis not present

## 2020-07-24 DIAGNOSIS — R911 Solitary pulmonary nodule: Secondary | ICD-10-CM

## 2020-07-24 DIAGNOSIS — N183 Chronic kidney disease, stage 3 unspecified: Secondary | ICD-10-CM | POA: Diagnosis not present

## 2020-07-24 DIAGNOSIS — R531 Weakness: Secondary | ICD-10-CM | POA: Insufficient documentation

## 2020-07-24 LAB — URINALYSIS, ROUTINE W REFLEX MICROSCOPIC
Bilirubin Urine: NEGATIVE
Glucose, UA: NEGATIVE mg/dL
Ketones, ur: NEGATIVE mg/dL
Leukocytes,Ua: NEGATIVE
Nitrite: NEGATIVE
Protein, ur: 30 mg/dL — AB
Specific Gravity, Urine: 1.021 (ref 1.005–1.030)
pH: 6.5 (ref 5.0–8.0)

## 2020-07-24 LAB — CBC WITH DIFFERENTIAL/PLATELET
Abs Immature Granulocytes: 0.03 10*3/uL (ref 0.00–0.07)
Basophils Absolute: 0 10*3/uL (ref 0.0–0.1)
Basophils Relative: 0 %
Eosinophils Absolute: 0.1 10*3/uL (ref 0.0–0.5)
Eosinophils Relative: 1 %
HCT: 40.2 % (ref 36.0–46.0)
Hemoglobin: 13.3 g/dL (ref 12.0–15.0)
Immature Granulocytes: 0 %
Lymphocytes Relative: 34 %
Lymphs Abs: 3.5 10*3/uL (ref 0.7–4.0)
MCH: 31.1 pg (ref 26.0–34.0)
MCHC: 33.1 g/dL (ref 30.0–36.0)
MCV: 94.1 fL (ref 80.0–100.0)
Monocytes Absolute: 1 10*3/uL (ref 0.1–1.0)
Monocytes Relative: 10 %
Neutro Abs: 5.6 10*3/uL (ref 1.7–7.7)
Neutrophils Relative %: 55 %
Platelets: 273 10*3/uL (ref 150–400)
RBC: 4.27 MIL/uL (ref 3.87–5.11)
RDW: 12.8 % (ref 11.5–15.5)
WBC: 10.2 10*3/uL (ref 4.0–10.5)
nRBC: 0 % (ref 0.0–0.2)

## 2020-07-24 LAB — BASIC METABOLIC PANEL
Anion gap: 9 (ref 5–15)
BUN: 27 mg/dL — ABNORMAL HIGH (ref 8–23)
CO2: 22 mmol/L (ref 22–32)
Calcium: 9.1 mg/dL (ref 8.9–10.3)
Chloride: 111 mmol/L (ref 98–111)
Creatinine, Ser: 1.24 mg/dL — ABNORMAL HIGH (ref 0.44–1.00)
GFR, Estimated: 49 mL/min — ABNORMAL LOW (ref >60)
Glucose, Bld: 97 mg/dL (ref 70–99)
Potassium: 3.6 mmol/L (ref 3.5–5.1)
Sodium: 142 mmol/L (ref 135–145)

## 2020-07-24 IMAGING — CT CT ANGIO NECK
2 of 5 series · 7 of 14 positions shown · IV contrast (APPLIED)
Comparison: None.

CLINICAL DATA: Headache.  History of AVM

EXAM:
CT ANGIOGRAPHY HEAD AND NECK
TECHNIQUE: Multidetector CT imaging of the head and neck was performed using
the standard protocol during bolus administration of intravenous
contrast. Multiplanar CT image reconstructions and MIPs were
obtained to evaluate the vascular anatomy. Carotid stenosis
measurements (when applicable) are obtained utilizing NASCET
criteria, using the distal internal carotid diameter as the
denominator.
CONTRAST:  60mL OMNIPAQUE IOHEXOL 350 MG/ML SOLN

[Series 7: cta head & neck · axial · 0.45mm/px · z∈[-231,+0]mm · 5 of 696 slices shown]
[im 116/696  soft-tissue]
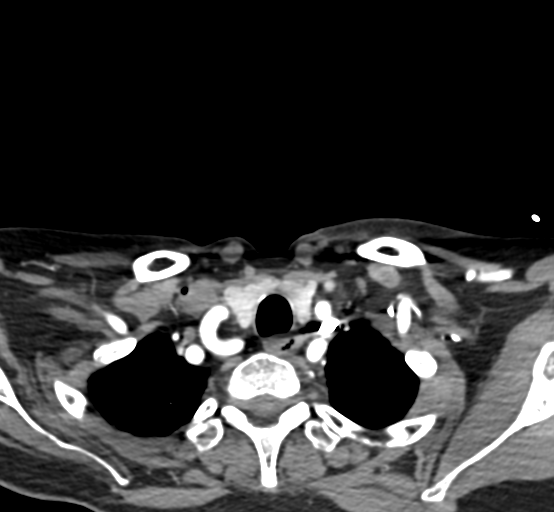
[im 232/696  bone]
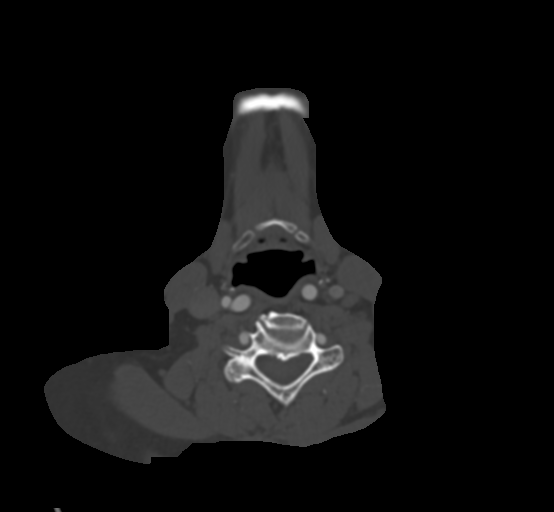
[im 348/696  soft-tissue]
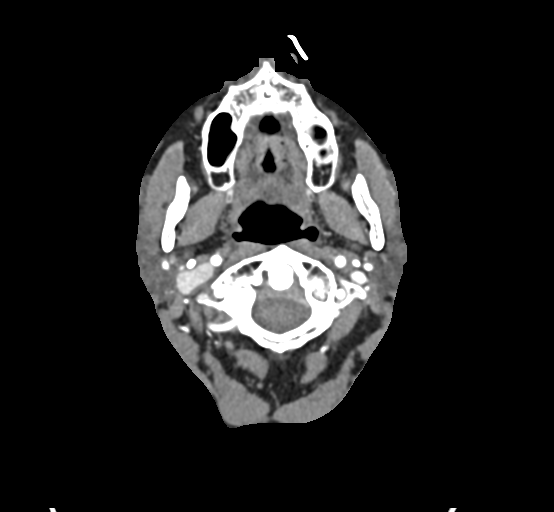
[im 464/696  bone]
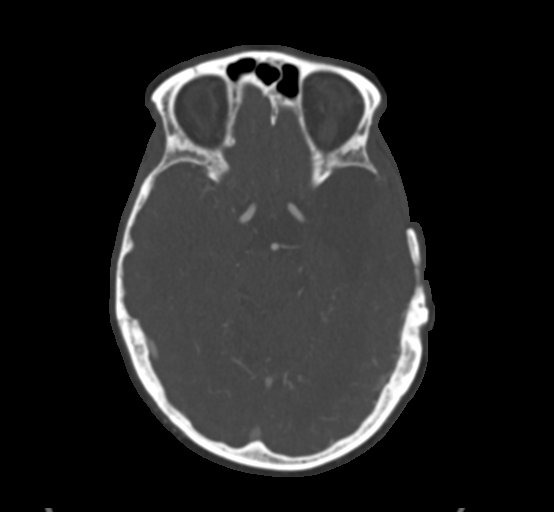
[im 580/696  soft-tissue]
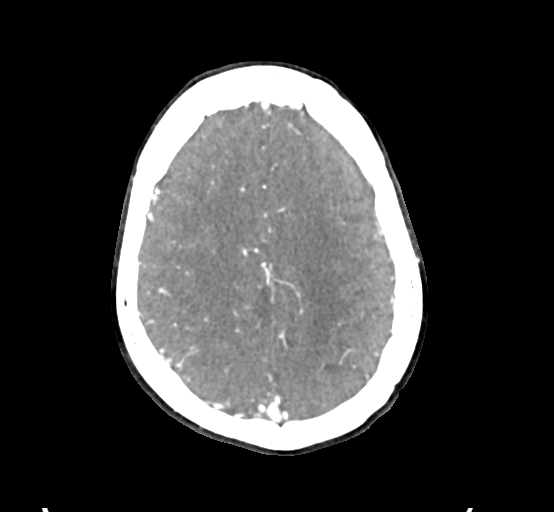

[Series 8: ax thin · axial · 0.45mm/px · z∈[-172,-57]mm · 2 of 349 slices shown]
[im 117/349  soft-tissue]
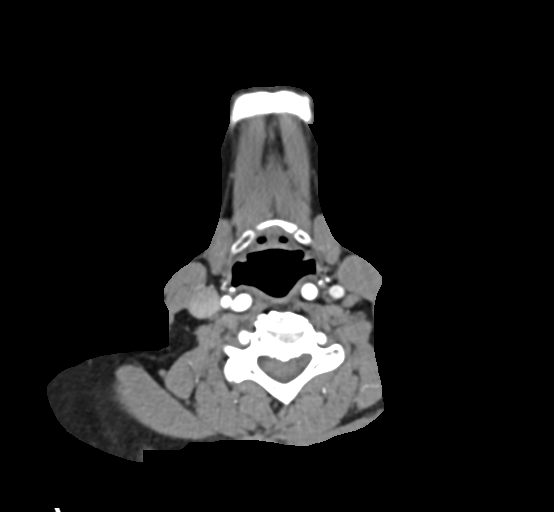
[im 233/349  soft-tissue]
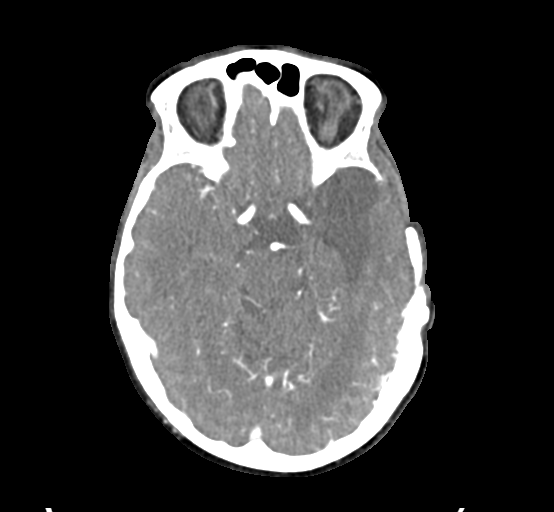

[7 of 14 positions shown; findings below may reference images not displayed]

FINDINGS: CT HEAD FINDINGS

Brain: Chronic infarct left MCA territory with hypodensity and
volume loss involving the basal ganglia and left frontal and
temporal lobe. There is compensatory enlargement of the left lateral
ventricle. Small areas of calcific density within the infarct may be
vascular calcification or dystrophic calcification in infarct.

Negative for acute hemorrhage, infarct, or mass lesion.

Vascular: Negative for hyperdense vessel.

Skull: Left-sided craniotomy.  No acute skeletal abnormality.

Sinuses: Paranasal sinuses clear.

Orbits: Negative

Review of the MIP images confirms the above findings

CTA NECK FINDINGS

Aortic arch: Standard branching. Imaged portion shows no evidence of
aneurysm or dissection. No significant stenosis of the major arch
vessel origins.

Right carotid system: Normal right carotid bifurcation. Negative for
right carotid stenosis.

Left carotid system: Normal left carotid bifurcation. Negative for
carotid stenosis.

Vertebral arteries: Normal vertebral arteries bilaterally without
stenosis.

Skeleton: Left craniotomy. Cervical spondylosis. No acute skeletal
abnormality.

Other neck: No soft tissue mass or adenopathy. Multiple small
nodules in the thyroid all under 1 cm.

Upper chest: Sub solid nodule right upper lobe 14 mm in diameter. No
priors for comparison.

Review of the MIP images confirms the above findings

CTA HEAD FINDINGS

Anterior circulation: Cavernous carotid widely patent with mild
atherosclerotic calcification. Right anterior right middle cerebral
arteries normal bilaterally. Left anterior cerebral artery widely
patent

Left M1 segment widely patent. Left MCA bifurcation patent. There is
a cluster of vessels in the left sylvian fissure compatible with
residual AVM. This appears to drain into the vein of Labbe
laterally.

Posterior circulation: Normal posterior circulation without stenosis
or occlusion or vascular malformation.

Venous sinuses: Normal venous enhancement. Mild asymmetric
enlargement left vein of Labbe.

Anatomic variants: None

Review of the MIP images confirms the above findings
IMPRESSION: 1. Chronic left MCA infarct.  No acute infarct or hemorrhage
2. Residual AVM in the left sylvian fissure draining into the vein
of Labbe.
3. 13 mm sub solid nodule right upper lobe. Possible infection or
neoplasm. Recommend CT chest with contrast for further evaluation.

## 2020-07-24 IMAGING — CT CT ANGIO HEAD
1 of 7 series · 5 of 27 positions shown · IV contrast (omnipaque)
Comparison: None.

CLINICAL DATA: Headache.  History of AVM

EXAM:
CT ANGIOGRAPHY HEAD AND NECK
TECHNIQUE: Multidetector CT imaging of the head and neck was performed using
the standard protocol during bolus administration of intravenous
contrast. Multiplanar CT image reconstructions and MIPs were
obtained to evaluate the vascular anatomy. Carotid stenosis
measurements (when applicable) are obtained utilizing NASCET
criteria, using the distal internal carotid diameter as the
denominator.
CONTRAST:  60mL OMNIPAQUE IOHEXOL 350 MG/ML SOLN

[Series 8: ax thin · axial · 0.45mm/px · z∈[-230,+1]mm · 5 of 349 slices shown]
[im 59/349  soft-tissue]
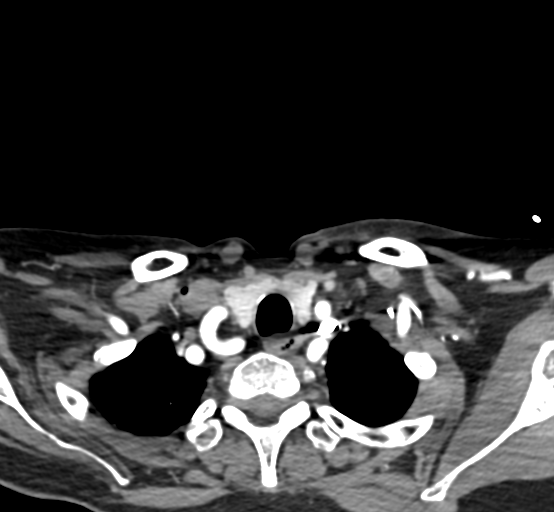
[im 117/349  bone]
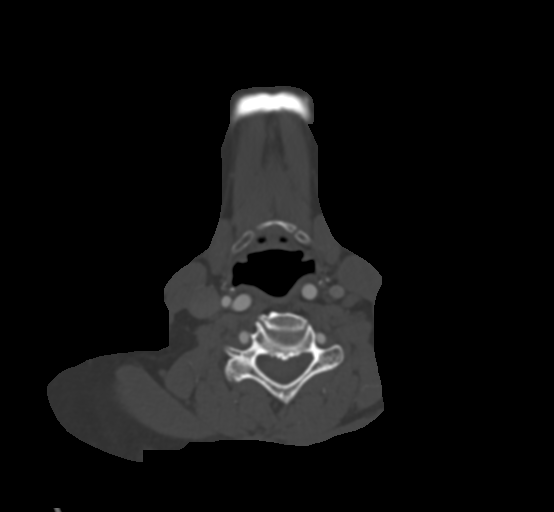
[im 175/349  soft-tissue]
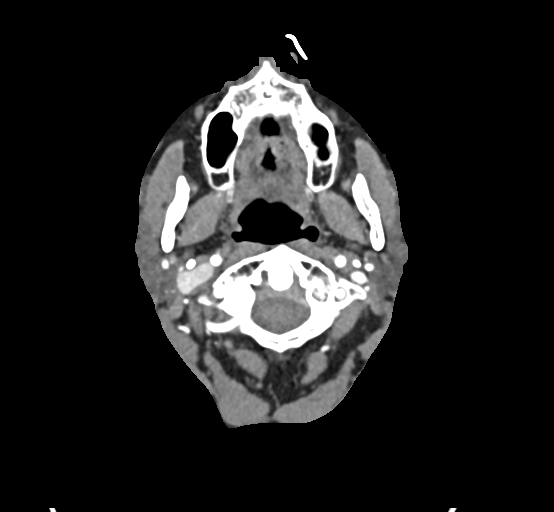
[im 233/349  bone]
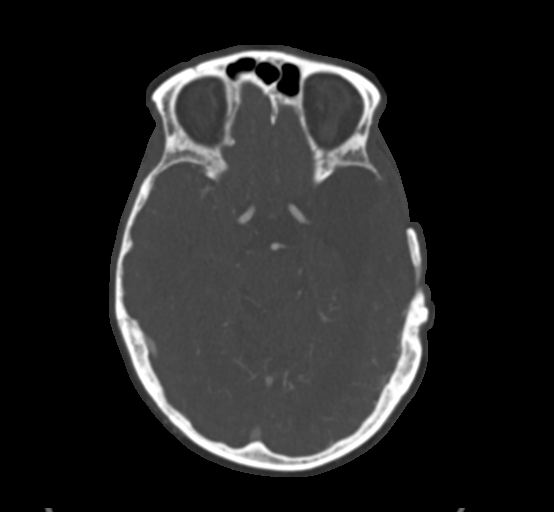
[im 291/349  soft-tissue]
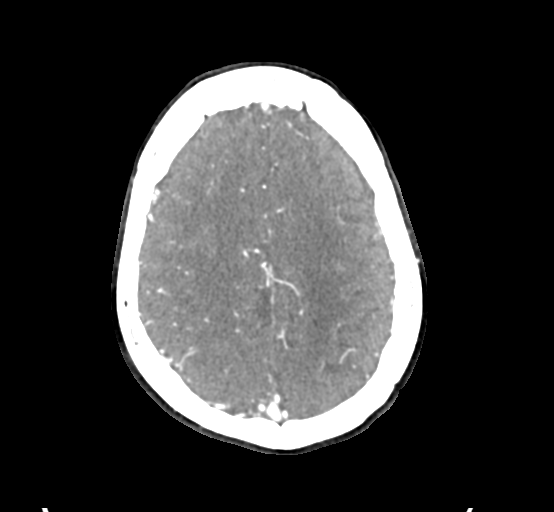

[5 of 27 positions shown; findings below may reference images not displayed]

FINDINGS: CT HEAD FINDINGS

Brain: Chronic infarct left MCA territory with hypodensity and
volume loss involving the basal ganglia and left frontal and
temporal lobe. There is compensatory enlargement of the left lateral
ventricle. Small areas of calcific density within the infarct may be
vascular calcification or dystrophic calcification in infarct.

Negative for acute hemorrhage, infarct, or mass lesion.

Vascular: Negative for hyperdense vessel.

Skull: Left-sided craniotomy.  No acute skeletal abnormality.

Sinuses: Paranasal sinuses clear.

Orbits: Negative

Review of the MIP images confirms the above findings

CTA NECK FINDINGS

Aortic arch: Standard branching. Imaged portion shows no evidence of
aneurysm or dissection. No significant stenosis of the major arch
vessel origins.

Right carotid system: Normal right carotid bifurcation. Negative for
right carotid stenosis.

Left carotid system: Normal left carotid bifurcation. Negative for
carotid stenosis.

Vertebral arteries: Normal vertebral arteries bilaterally without
stenosis.

Skeleton: Left craniotomy. Cervical spondylosis. No acute skeletal
abnormality.

Other neck: No soft tissue mass or adenopathy. Multiple small
nodules in the thyroid all under 1 cm.

Upper chest: Sub solid nodule right upper lobe 14 mm in diameter. No
priors for comparison.

Review of the MIP images confirms the above findings

CTA HEAD FINDINGS

Anterior circulation: Cavernous carotid widely patent with mild
atherosclerotic calcification. Right anterior right middle cerebral
arteries normal bilaterally. Left anterior cerebral artery widely
patent

Left M1 segment widely patent. Left MCA bifurcation patent. There is
a cluster of vessels in the left sylvian fissure compatible with
residual AVM. This appears to drain into the vein of Labbe
laterally.

Posterior circulation: Normal posterior circulation without stenosis
or occlusion or vascular malformation.

Venous sinuses: Normal venous enhancement. Mild asymmetric
enlargement left vein of Labbe.

Anatomic variants: None

Review of the MIP images confirms the above findings
IMPRESSION: 1. Chronic left MCA infarct.  No acute infarct or hemorrhage
2. Residual AVM in the left sylvian fissure draining into the vein
of Labbe.
3. 13 mm sub solid nodule right upper lobe. Possible infection or
neoplasm. Recommend CT chest with contrast for further evaluation.

## 2020-07-24 MED ORDER — DIPHENHYDRAMINE HCL 50 MG/ML IJ SOLN
12.5000 mg | Freq: Once | INTRAMUSCULAR | Status: AC
  Administered 2020-07-24: 12.5 mg via INTRAVENOUS
  Filled 2020-07-24: qty 1

## 2020-07-24 MED ORDER — SODIUM CHLORIDE 0.9 % IV BOLUS
1000.0000 mL | Freq: Once | INTRAVENOUS | Status: AC
  Administered 2020-07-24: 1000 mL via INTRAVENOUS

## 2020-07-24 MED ORDER — METOCLOPRAMIDE HCL 5 MG/ML IJ SOLN
10.0000 mg | Freq: Once | INTRAMUSCULAR | Status: AC
  Administered 2020-07-24: 10 mg via INTRAVENOUS
  Filled 2020-07-24: qty 2

## 2020-07-24 MED ORDER — IOHEXOL 350 MG/ML SOLN
60.0000 mL | Freq: Once | INTRAVENOUS | Status: AC | PRN
  Administered 2020-07-24: 60 mL via INTRAVENOUS

## 2020-07-24 NOTE — Discharge Instructions (Addendum)
For headaches he can use prescribed medications.  If they are not working you can switch to 1000 mg of Tylenol 4 times daily.  Do not mix the 2 as this can be too much Tylenol.  Follow-up with your neurologist for headache control.  Call them later today for an appointment.  Follow-up with your primary care doctor for further evaluation of the lung nodule and routine cancer screenings.  Return to [place] with any concerning changes.

## 2020-07-24 NOTE — ED Notes (Signed)
ED Provider at bedside. 

## 2020-07-24 NOTE — ED Notes (Signed)
Patient assisted to bedside toilet and dressed for discharge.

## 2020-07-24 NOTE — ED Provider Notes (Signed)
[facility] EMERGENCY DEPT Provider Note   CSN: [id number] Arrival date & time: 07/24/20  6812     History Chief Complaint  Patient presents with  . Headache    Tonya Thompson is a 65 y.o. female.   Headache Pain location:  Frontal Radiates to:  Does not radiate Onset quality:  Gradual Duration:  7 days Timing:  Constant Progression:  Worsening Chronicity:  Recurrent Similar to prior headaches: yes   Relieved by:  Nothing Worsened by:  Nothing Ineffective treatments:  Prescription medications Associated symptoms: weakness (chronic and unchaged)   Associated symptoms: no back pain, no blurred vision, no congestion, no cough, no diarrhea, no dizziness, no fatigue, no fever, no loss of balance, no nausea, no numbness, no photophobia, no tingling, no visual change and no vomiting        Past Medical History:  Diagnosis Date  . Acute kidney injury ([place]) 09/03/2019  . Allergy    seasonal allergies  . Arthritis    LEFT hand  . AVM (arteriovenous malformation) brain   . Cataract    bilateral-not a surgical candidate at this time (07/23/2020)  . GERD (gastroesophageal reflux disease)    on meds  . Hyperlipidemia    diet controlled  . Insulin resistance 02/16/2018  . Seizures ([place])    Grand Mal & Partial complex seizure disorder-last 1998  . Stroke [facility])    5 total so far (age [phone])  . Ureteral stone 05/10/2019    Patient Active Problem List   Diagnosis Date Noted  . History of colon polyps 06/26/2020  . History of renal stone 09/04/2019  . Medication management 02/16/2018  . Osteopenia 01/10/2018  . CKD (chronic kidney disease) stage 3, GFR 30-59 ml/min ([facility]CC) 11/10/2017  . 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 Hyperlipidemia, mixed 06/01/2017  . Abnormal glucose 06/01/2017  . Vitamin D deficiency 06/01/2017  . 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) 11/15/2016  . 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 Headache,  unspecified headache type 04/14/2016  . Right hemiparesis ([place]) 12/29/2015    Past Surgical History:  Procedure Laterality Date  . BRAIN SURGERY  09/19/1978   at [facility], Dr. Leida Lauth  . COLONOSCOPY  2017   [place], [place]  . CYSTOSCOPY/URETEROSCOPY/HOLMIUM LASER/STENT PLACEMENT Left 05/10/2019   Procedure: CYSTOSCOPY/RETROGRADE/URETEROSCOPY/HOLMIUM LASER/STENT PLACEMENT;  Surgeon: Raynelle Bring, MD;  Location: [facility] ORS;  Service: Urology;  Laterality: Left;  . FOOT SURGERY Right 10/1985   ankle fusion, at [facility], Dr. Lorelee Cover  . POLYPECTOMY  2017   multiple adenomas  . TUBAL LIGATION  1981  . WISDOM TOOTH EXTRACTION       OB History    Gravida  0   Para  0   Term  0   Preterm  0   AB  0   Living  0     SAB  0   IAB  0   Ectopic  0   Multiple  0   Live Births  0           Family History  Problem Relation Age of Onset  . Heart disease Mother   . Heart failure Mother   . Heart attack Mother   . Diabetes Father   . Hyperlipidemia Father   . Dementia Father   . Prostate cancer Father   . Melanoma Father   . Colon polyps Father   . Hyperlipidemia Paternal Uncle   . Hypertension Paternal Uncle   . Dementia Paternal Uncle   .  Heart attack Maternal Grandfather   . Stroke Paternal Grandmother   . CAD Maternal Uncle   . COPD Maternal Uncle        smoker  . Breast cancer Neg Hx   . Colon cancer Neg Hx   . Esophageal cancer Neg Hx   . Stomach cancer Neg Hx   . Rectal cancer Neg Hx     Social History   Tobacco Use  . Smoking status: Never Smoker  . Smokeless tobacco: Never Used  Vaping Use  . Vaping Use: Never used  Substance Use Topics  . Alcohol use: No  . Drug use: No    Home Medications Prior to Admission medications   Medication Sig Start Date End Date Taking? Authorizing Provider  fexofenadine (ALLEGRA) 180 MG tablet Take 180 mg by mouth daily.   Yes [provider]  topiramate (TOPAMAX) 200 MG tablet Takes 1 &1/2 tabs 2 x /day    (3  tabs /day) Patient taking differently: Take 300 mg by mouth 2 (two) times daily. MD wants her to only take the American made Zy Topamax 02/19/18  Yes Unk Pinto, MD  baclofen (LIORESAL) 10 MG tablet Take 30 mg by mouth at bedtime.    [provider]  butalbital-acetaminophen-caffeine (FIORICET, ESGIC) 50-325-40 MG tablet Take 1 tablet by mouth 2 (two) times daily as needed for headache or migraine.     [provider]  calcium carbonate (TUMS - DOSED IN MG ELEMENTAL CALCIUM) 500 MG chewable tablet Chew 1 tablet by mouth daily.    [provider]  Calcium Citrate-Vitamin D (CALCIUM + D PO) Take 1 tablet by mouth daily.     [provider]  dexamethasone (DECADRON) 4 MG tablet Take 1 tab 3 x day - 3 days, then 2 x day - 3 days, then 1 tab daily 07/21/20   Unk Pinto, MD  Multiple Vitamins-Minerals (MULTIVITAMIN WITH MINERALS) tablet Take 1 tablet by mouth daily.     [provider]  Omega-3 Fatty Acids (FISH OIL PO) Take 1 capsule by mouth daily.    [provider]  POTASSIUM PO Take 1 tablet by mouth daily.    [provider]  Propylene Glycol (SYSTANE BALANCE) 0.6 % SOLN Place 1 drop into both eyes 2 (two) times daily.    [provider]  Vitamin D3 (VITAMIN D) 25 MCG tablet Take 1,000 Units by mouth daily.     [provider]    Allergies    Aspirin, Cheese, Chocolate, Coffee flavor, Other, and Red wine complex [germanium]  Review of Systems   Review of Systems  Constitutional: Negative for chills, fatigue and fever.  HENT: Negative for congestion and rhinorrhea.   Eyes: Negative for blurred vision and photophobia.  Respiratory: Negative for cough and shortness of breath.   Cardiovascular: Negative for chest pain and palpitations.  Gastrointestinal: Negative for diarrhea, nausea and vomiting.  Genitourinary: Negative for difficulty urinating and dysuria.  Musculoskeletal: Negative for arthralgias  and back pain.  Skin: Negative for rash and wound.  Neurological: Positive for weakness (chronic and unchaged) and headaches. Negative for dizziness, light-headedness, numbness and loss of balance.    Physical Exam Updated Vital Signs BP (!) 144/88 (BP Location: Left Arm)   Pulse (!) 57   Temp 99.3 F (37.4 C) (Oral)   Resp 11   Ht 5\' 6"  (1.676 m)   Wt 67.6 kg   SpO2 100%   BMI 24.05 kg/m   Physical Exam Vitals  and nursing note reviewed. Exam conducted with a chaperone present.  Constitutional:      General: She is not in acute distress.    Appearance: Normal appearance.  HENT:     Head: Normocephalic and atraumatic.     Nose: No rhinorrhea.  Eyes:     General: No visual field deficit.       Right eye: No discharge.        Left eye: No discharge.     Conjunctiva/sclera: Conjunctivae normal.  Cardiovascular:     Rate and Rhythm: Normal rate and regular rhythm.  Pulmonary:     Effort: Pulmonary effort is normal. No respiratory distress.     Breath sounds: No stridor.  Abdominal:     General: Abdomen is flat. There is no distension.     Palpations: Abdomen is soft.  Musculoskeletal:        General: No tenderness or signs of injury.  Skin:    General: Skin is warm and dry.  Neurological:     General: No focal deficit present.     Mental Status: She is alert and oriented to person, place, and time. Mental status is at baseline.     GCS: GCS eye subscore is 4. GCS verbal subscore is 5. GCS motor subscore is 6.     Cranial Nerves: No cranial nerve deficit, dysarthria or facial asymmetry.     Sensory: Sensory deficit (right sided) present.     Motor: Weakness (right sided) present.     Comments: Patient has decreased sensation on right compared to left both upper and lower extremities.  Has some minimal motor function in the right upper and lower extremities, but is able to move major hip flexors knee flexors and shoulder.  Patient's gait is normal for her with a cane given  her weaknesses.  Psychiatric:        Mood and Affect: Mood normal.        Behavior: Behavior normal.     ED Results / Procedures / Treatments   Labs (all labs ordered are listed, but only abnormal results are displayed) Labs Reviewed  BASIC METABOLIC PANEL - Abnormal; Notable for the following components:      Result Value   BUN 27 (*)    Creatinine, Ser 1.24 (*)    GFR, Estimated 49 (*)    All other components within normal limits  URINALYSIS, ROUTINE W REFLEX MICROSCOPIC - Abnormal; Notable for the following components:   Color, Urine COLORLESS (*)    Hgb urine dipstick SMALL (*)    Protein, ur 30 (*)    Bacteria, UA FEW (*)    All other components within normal limits  CBC WITH DIFFERENTIAL/PLATELET    EKG None  Radiology CT Angio Head W or Wo Contrast  Result Date: 07/24/2020 CLINICAL DATA:  Headache.  History of AVM EXAM: CT ANGIOGRAPHY HEAD AND NECK TECHNIQUE: Multidetector CT imaging of the head and neck was performed using the standard protocol during bolus administration of intravenous contrast. Multiplanar CT image reconstructions and MIPs were obtained to evaluate the vascular anatomy. Carotid stenosis measurements (when applicable) are obtained utilizing NASCET criteria, using the distal internal carotid diameter as the denominator. CONTRAST:  10mL OMNIPAQUE IOHEXOL 350 MG/ML SOLN COMPARISON:  None. FINDINGS: CT HEAD FINDINGS Brain: Chronic infarct left MCA territory with hypodensity and volume loss involving the basal ganglia and left frontal and temporal lobe. There is compensatory enlargement of the left lateral ventricle. Small areas of calcific density within  the infarct may be vascular calcification or dystrophic calcification in infarct. Negative for acute hemorrhage, infarct, or mass lesion. Vascular: Negative for hyperdense vessel. Skull: Left-sided craniotomy.  No acute skeletal abnormality. Sinuses: Paranasal sinuses clear. Orbits: Negative Review of the MIP  images confirms the above findings CTA NECK FINDINGS Aortic arch: Standard branching. Imaged portion shows no evidence of aneurysm or dissection. No significant stenosis of the major arch vessel origins. Right carotid system: Normal right carotid bifurcation. Negative for right carotid stenosis. Left carotid system: Normal left carotid bifurcation. Negative for carotid stenosis. Vertebral arteries: Normal vertebral arteries bilaterally without stenosis. Skeleton: Left craniotomy. Cervical spondylosis. No acute skeletal abnormality. Other neck: No soft tissue mass or adenopathy. Multiple small nodules in the thyroid all under 1 cm. Upper chest: Sub solid nodule right upper lobe 14 mm in diameter. No priors for comparison. Review of the MIP images confirms the above findings CTA HEAD FINDINGS Anterior circulation: Cavernous carotid widely patent with mild atherosclerotic calcification. Right anterior right middle cerebral arteries normal bilaterally. Left anterior cerebral artery widely patent Left M1 segment widely patent. Left MCA bifurcation patent. There is a cluster of vessels in the left sylvian fissure compatible with residual AVM. This appears to drain into the vein of Labbe laterally. Posterior circulation: Normal posterior circulation without stenosis or occlusion or vascular malformation. Venous sinuses: Normal venous enhancement. Mild asymmetric enlargement left vein of Labbe. Anatomic variants: None Review of the MIP images confirms the above findings IMPRESSION: 1. Chronic left MCA infarct.  No acute infarct or hemorrhage 2. 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 3. 13 mm sub solid nodule right upper lobe. Possible infection or neoplasm. Recommend CT chest with contrast for further evaluation. Electronically Signed   By: Franchot Gallo M.D.   On: 07/24/2020 10:41   CT ANGIO NECK W OR WO CONTRAST  Result Date: 07/24/2020 CLINICAL DATA:  Headache.  History of AVM  EXAM: CT ANGIOGRAPHY HEAD AND NECK TECHNIQUE: Multidetector CT imaging of the head and neck was performed using the standard protocol during bolus administration of intravenous contrast. Multiplanar CT image reconstructions and MIPs were obtained to evaluate the vascular anatomy. Carotid stenosis measurements (when applicable) are obtained utilizing NASCET criteria, using the distal internal carotid diameter as the denominator. CONTRAST:  80mL OMNIPAQUE IOHEXOL 350 MG/ML SOLN COMPARISON:  None. FINDINGS: CT HEAD FINDINGS Brain: Chronic infarct left MCA territory with hypodensity and volume loss involving the basal ganglia and left frontal and temporal lobe. There is compensatory enlargement of the left lateral ventricle. Small areas of calcific density within the infarct may be vascular calcification or dystrophic calcification in infarct. Negative for acute hemorrhage, infarct, or mass lesion. Vascular: Negative for hyperdense vessel. Skull: Left-sided craniotomy.  No acute skeletal abnormality. Sinuses: Paranasal sinuses clear. Orbits: Negative Review of the MIP images confirms the above findings CTA NECK FINDINGS Aortic arch: Standard branching. Imaged portion shows no evidence of aneurysm or dissection. No significant stenosis of the major arch vessel origins. Right carotid system: Normal right carotid bifurcation. Negative for right carotid stenosis. Left carotid system: Normal left carotid bifurcation. Negative for carotid stenosis. Vertebral arteries: Normal vertebral arteries bilaterally without stenosis. Skeleton: Left craniotomy. Cervical spondylosis. No acute skeletal abnormality. Other neck: No soft tissue mass or adenopathy. Multiple small nodules in the thyroid all under 1 cm. Upper chest: Sub solid nodule right upper lobe 14 mm in diameter. No priors for comparison. Review of the MIP images confirms the above  findings CTA HEAD FINDINGS Anterior circulation: Cavernous carotid widely patent with mild  atherosclerotic calcification. Right anterior right middle cerebral arteries normal bilaterally. Left anterior cerebral artery widely patent Left M1 segment widely patent. Left MCA bifurcation patent. There is a cluster of vessels in the left sylvian fissure compatible with residual AVM. This appears to drain into the vein of Labbe laterally. Posterior circulation: Normal posterior circulation without stenosis or occlusion or vascular malformation. Venous sinuses: Normal venous enhancement. Mild asymmetric enlargement left vein of Labbe. Anatomic variants: None Review of the MIP images confirms the above findings IMPRESSION: 1. Chronic left MCA infarct.  No acute infarct or hemorrhage 2. 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 3. 13 mm sub solid nodule right upper lobe. Possible infection or neoplasm. Recommend CT chest with contrast for further evaluation. Electronically Signed   By: Franchot Gallo M.D.   On: 07/24/2020 10:41    Procedures Procedures   Medications Ordered in ED Medications  sodium chloride 0.9 % bolus 1,000 mL (1,000 mLs Intravenous New Bag/Given 07/24/20 0942)  metoCLOPramide (REGLAN) injection 10 mg (10 mg Intravenous Given 07/24/20 0942)  diphenhydrAMINE (BENADRYL) injection 12.5 mg (12.5 mg Intravenous Given 07/24/20 0942)  iohexol (OMNIPAQUE) 350 MG/ML injection 60 mL (60 mLs Intravenous Contrast Given 07/24/20 0955)    ED Course  I have reviewed the triage vital signs and the nursing notes.  Pertinent labs & imaging results that were available during my care of the patient were reviewed by me and considered in my medical decision making (see chart for details).    MDM Rules/Calculators/A&P                          Patient with history of stroke AVM comes in with worsening headache.  Also has a history of migraine therapy for which she takes Fioricet.  Fioricet has not been working over the last 7 days.  She was overall well-appearing and  looks comfortable.  Normal vital signs.  Her neurologic exam is at her baseline.  But given her history with concerns of vascular malformation will get CT imaging to include vascular studies.  We will check kidney function electrolytes.  EKG was performed in triage and shows sinus rhythm with no acute ischemic change interval abnormality or arrhythmia incomplete right bundle branch.  Patient will get Reglan Benadryl.  She is told to avoid caffeine and other things that contain stimulants as this could worsen her migraines that she has chronically.  Fioricet has caffeine in it, so we will avoid caffeine containing medications.  She has been able to eat and drink she has had normal bowel and urinary function.  No other injuries or illness reported.  CT imaging reviewed by myself and radiology shows no acute changes.  AVN is present but no signs of complication.  Old stroke is present.  There is a incidental finding of a lung nodule.  She is made aware of this and she will follow up with her primary care doctor for routine cancer screenings and evaluation of this nodule.  She is told to follow-up with a neurologist for headache control.  Symptoms here are normal with moderate relief.  She feels safe for discharge home.  She is given outpatient instructions over-the-counter made medication recommendations.  Pt is safe for DC home with outpatient follow up. The patient agrees with the plan and has no other questions or concerns.   Final Clinical  Impression(s) / ED Diagnoses Final diagnoses:  Acute nonintractable headache, unspecified headache type    Rx / DC Orders ED Discharge Orders    None       Breck Coons, MD 07/24/20 1104

## 2020-07-24 NOTE — ED Triage Notes (Signed)
Pt arrives from home with complaints of front to left sided headache since about 7 days ago. Pt reports she went to the MD 4 days ago for same, was given a steroid and Fioricet but it has not helped much. Pt has hx of 5 strokes and brain surgeries, weakness on right side baseline.

## 2020-07-24 NOTE — ED Notes (Signed)
Pt discharged to home. Discharge instructions have been discussed with patient and/or family members. Pt verbally acknowledges understanding d/c instructions, and endorses comprehension to checkout at registration before leaving.  °

## 2020-07-26 ENCOUNTER — Emergency Department (HOSPITAL_BASED_OUTPATIENT_CLINIC_OR_DEPARTMENT_OTHER)
Admission: EM | Admit: 2020-07-26 | Discharge: 2020-07-26 | Disposition: A | Discharge status: Home / Self Care | Payer: Medicare Other | Attending: Emergency Medicine | Admitting: Emergency Medicine

## 2020-07-26 ENCOUNTER — Other Ambulatory Visit: Payer: Self-pay

## 2020-07-26 DIAGNOSIS — R519 Headache, unspecified: Secondary | ICD-10-CM | POA: Insufficient documentation

## 2020-07-26 DIAGNOSIS — N183 Chronic kidney disease, stage 3 unspecified: Secondary | ICD-10-CM | POA: Diagnosis not present

## 2020-07-26 DIAGNOSIS — H53149 Visual discomfort, unspecified: Secondary | ICD-10-CM | POA: Diagnosis not present

## 2020-07-26 DIAGNOSIS — R531 Weakness: Secondary | ICD-10-CM | POA: Diagnosis not present

## 2020-07-26 LAB — SEDIMENTATION RATE: Sed Rate: 11 mm/hr (ref 0–22)

## 2020-07-26 MED ORDER — HYDROCODONE-ACETAMINOPHEN 5-325 MG PO TABS
1.0000 | ORAL_TABLET | Freq: Once | ORAL | Status: AC
  Administered 2020-07-26: 1 via ORAL
  Filled 2020-07-26: qty 1

## 2020-07-26 MED ORDER — DIPHENHYDRAMINE HCL 50 MG/ML IJ SOLN
25.0000 mg | Freq: Once | INTRAMUSCULAR | Status: AC
  Administered 2020-07-26: 25 mg via INTRAVENOUS
  Filled 2020-07-26: qty 1

## 2020-07-26 MED ORDER — HYDROCODONE-ACETAMINOPHEN 5-325 MG PO TABS: 1.0000 | ORAL_TABLET | ORAL | 0 refills | Status: DC | PRN

## 2020-07-26 MED ORDER — METOCLOPRAMIDE HCL 5 MG/ML IJ SOLN
10.0000 mg | Freq: Once | INTRAMUSCULAR | Status: AC
  Administered 2020-07-26: 10 mg via INTRAVENOUS
  Filled 2020-07-26: qty 2

## 2020-07-26 MED ORDER — FENTANYL CITRATE (PF) 100 MCG/2ML IJ SOLN
50.0000 ug | Freq: Once | INTRAMUSCULAR | Status: AC
  Administered 2020-07-26: 50 ug via INTRAVENOUS
  Filled 2020-07-26: qty 2

## 2020-07-26 NOTE — ED Notes (Signed)
ED Provider at bedside. 

## 2020-07-26 NOTE — ED Triage Notes (Addendum)
Pt c/o L temporal headache x10 days. Pt was seen Thurs for same complaint. Hx of 5 strokes & brain surgeries; weakness on R sd baseline. Taking Fiorcet, Sudafed, & Tylenol extra strength w/ little to no relief.

## 2020-07-26 NOTE — ED Provider Notes (Signed)
[facility] EMERGENCY DEPT Provider Note   CSN: [id number] Arrival date & time: 07/26/20  1011     History Chief Complaint  Patient presents with  . Headache    Luanna Weesner Hisaw is a 65 y.o. female.  Patient is a 65 year old female who presents with a headache.  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  She also has resulting seizures although she says her last seizure was in 1998.  She reports a 10-day history of a headache.  She describes as pressure points in her left frontal area and also in the posterior head.  She says been pretty constant over the last 10 days.  It started gradually.  She does not feel that it is getting any worse but has not gotten any better.  She denies any vision changes.  She has some loss of her peripheral vision from her prior strokes but no change in this.  No sinus congestion other than some mild allergies.  No fevers.  No associated neck pain.  No change in her baseline right-sided weakness.  No new numbness or weakness to her extremities or her face.  She does not report that she has any pain on chewing.  She has some photophobia but no nausea or vomiting.  No dizziness.  She denies any recent trauma.  She said that she has had prior migrainous type headaches and its in the same spot both in her left forehead and posterior head.  However she says normally she would take a Fioricet it would go away and this time it has not gone away.  She said the last time she had a migraine was in 2018.  She is not on anticoagulants.  She denies any recent illnesses.  She initially saw her PCP who started her on Decadron which she is currently taking oral Decadron.  She also has been taking some Fioricet and extra strength Tylenol.  She was seen in the ED 2 days ago for the headache and had a CT and CTA of her head and neck.  This showed some residual AVM but no  new findings.  No signs of intracranial hemorrhage.  She has a noted lung nodule for which her PCP is arranged outpatient follow-up.        Past Medical History:  Diagnosis Date  . Acute kidney injury ([place]) 09/03/2019  . Allergy    seasonal allergies  . Arthritis    LEFT hand  . AVM (arteriovenous malformation) brain   . Cataract    bilateral-not a surgical candidate at this time (07/23/2020)  . GERD (gastroesophageal reflux disease)    on meds  . Hyperlipidemia    diet controlled  . Insulin resistance 02/16/2018  . Seizures ([place])    Grand Mal & Partial complex seizure disorder-last 1998  . Stroke [facility])    5 total so far (age [phone])  . Ureteral stone 05/10/2019    Patient Active Problem List   Diagnosis Date Noted  . History of colon polyps 06/26/2020  . History of renal stone 09/04/2019  . Medication management 02/16/2018  . Osteopenia 01/10/2018  . CKD (chronic kidney disease) stage 3, GFR 30-59 ml/min (HCC) 11/10/2017  . 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 Hyperlipidemia, mixed 06/01/2017  . Abnormal glucose 06/01/2017  . Vitamin D deficiency 06/01/2017  . 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 Headache, unspecified headache type 04/14/2016  . Right hemiparesis ([place]) 12/29/2015    Past Surgical History:  Procedure Laterality Date  . BRAIN SURGERY  09/19/1978   at [facility], Dr. Leida Lauth  . COLONOSCOPY  2017   [place], [place]  . CYSTOSCOPY/URETEROSCOPY/HOLMIUM LASER/STENT PLACEMENT Left 05/10/2019   Procedure: CYSTOSCOPY/RETROGRADE/URETEROSCOPY/HOLMIUM LASER/STENT PLACEMENT;  Surgeon: Raynelle Bring, MD;  Location: [facility] ORS;  Service: Urology;  Laterality: Left;  . FOOT SURGERY Right 10/1985   ankle fusion, at [facility], Dr. Lorelee Cover  . POLYPECTOMY  2017   multiple adenomas  . TUBAL LIGATION  1981  . WISDOM TOOTH EXTRACTION       OB History    Gravida  0   Para  0   Term  0   Preterm   0   AB  0   Living  0     SAB  0   IAB  0   Ectopic  0   Multiple  0   Live Births  0           Family History  Problem Relation Age of Onset  . Heart disease Mother   . Heart failure Mother   . Heart attack Mother   . Diabetes Father   . Hyperlipidemia Father   . Dementia Father   . Prostate cancer Father   . Melanoma Father   . Colon polyps Father   . Hyperlipidemia Paternal Uncle   . Hypertension Paternal Uncle   . Dementia Paternal Uncle   . Heart attack Maternal Grandfather   . Stroke Paternal Grandmother   . CAD Maternal Uncle   . COPD Maternal Uncle        smoker  . Breast cancer Neg Hx   . Colon cancer Neg Hx   . Esophageal cancer Neg Hx   . Stomach cancer Neg Hx   . Rectal cancer Neg Hx     Social History   Tobacco Use  . Smoking status: Never Smoker  . Smokeless tobacco: Never Used  Vaping Use  . Vaping Use: Never used  Substance Use Topics  . Alcohol use: No  . Drug use: No    Home Medications Prior to Admission medications   Medication Sig Start Date End Date Taking? Authorizing Provider  HYDROcodone-acetaminophen (NORCO/VICODIN) 5-325 MG tablet Take 1 tablet by mouth every 4 (four) hours as needed. 07/26/20  Yes Malvin Johns, MD  baclofen (LIORESAL) 10 MG tablet Take 30 mg by mouth at bedtime.    [provider]  butalbital-acetaminophen-caffeine (FIORICET, ESGIC) 50-325-40 MG tablet Take 1 tablet by mouth 2 (two) times daily as needed for headache or migraine.     [provider]  calcium carbonate (TUMS - DOSED IN MG ELEMENTAL CALCIUM) 500 MG chewable tablet Chew 1 tablet by mouth daily.    [provider]  Calcium Citrate-Vitamin D (CALCIUM + D PO) Take 1 tablet by mouth daily.     [provider]  dexamethasone (DECADRON) 4 MG tablet Take 1 tab 3 x day - 3 days, then 2 x day - 3 days, then 1 tab daily 07/21/20   Unk Pinto, MD  fexofenadine (ALLEGRA) 180 MG tablet Take 180 mg by mouth  daily.    [provider]  Multiple Vitamins-Minerals (MULTIVITAMIN WITH MINERALS) tablet Take 1 tablet by mouth daily.     [provider]  Omega-3 Fatty Acids (FISH OIL PO) Take 1 capsule by mouth daily.  [provider]  POTASSIUM PO Take 1 tablet by mouth daily.    [provider]  Propylene Glycol (SYSTANE BALANCE) 0.6 % SOLN Place 1 drop into both eyes 2 (two) times daily.    [provider]  topiramate (TOPAMAX) 200 MG tablet Takes 1 &1/2 tabs 2 x /day    (3 tabs /day) Patient taking differently: Take 300 mg by mouth 2 (two) times daily. MD wants her to only take the American made Zy Topamax 02/19/18   Unk Pinto, MD  Vitamin D3 (VITAMIN D) 25 MCG tablet Take 1,000 Units by mouth daily.     [provider]    Allergies    Aspirin, Cheese, Chocolate, Coffee flavor, Other, and Red wine complex [germanium]  Review of Systems   Review of Systems  Constitutional: Negative for chills, diaphoresis, fatigue and fever.  HENT: Negative for congestion, rhinorrhea and sneezing.   Eyes: Positive for photophobia. Negative for visual disturbance.  Respiratory: Negative for cough, chest tightness and shortness of breath.   Cardiovascular: Negative for chest pain and leg swelling.  Gastrointestinal: Negative for abdominal pain, blood in stool, diarrhea, nausea and vomiting.  Genitourinary: Negative for difficulty urinating, flank pain, frequency and hematuria.  Musculoskeletal: Negative for arthralgias and back pain.  Skin: Negative for rash.  Neurological: Positive for headaches. Negative for dizziness, speech difficulty, weakness and numbness.    Physical Exam Updated Vital Signs BP (!) 141/78 (BP Location: Left Arm)   Pulse (!) 53   Temp 97.9 F (36.6 C)   Resp 18   Ht 5\' 6"  (1.676 m)   Wt 67.6 kg   SpO2 100%   BMI 24.05 kg/m   Physical Exam Constitutional:      Appearance: She is well-developed.  HENT:     Head:  Normocephalic and atraumatic.  Eyes:     Pupils: Pupils are equal, round, and reactive to light.  Cardiovascular:     Rate and Rhythm: Normal rate and regular rhythm.     Heart sounds: Normal heart sounds.  Pulmonary:     Effort: Pulmonary effort is normal. No respiratory distress.     Breath sounds: Normal breath sounds. No wheezing or rales.  Chest:     Chest wall: No tenderness.  Abdominal:     General: Bowel sounds are normal.     Palpations: Abdomen is soft.     Tenderness: There is no abdominal tenderness. There is no guarding or rebound.  Musculoskeletal:        General: Normal range of motion.     Cervical back: Normal range of motion and neck supple.  Lymphadenopathy:     Cervical: No cervical adenopathy.  Skin:    General: Skin is warm and dry.     Findings: No rash.  Neurological:     Mental Status: She is alert and oriented to person, place, and time.     Comments: She has some weakness in her right arm and right leg with some diminished sensation to light touch in these areas as compared paired to the left.  This is baseline and unchanged per patient.  Cranial nerves II through XII grossly intact, extraocular eye movements are intact, she does have some diminished peripheral visual fields but this is baseline per patient.     ED Results / Procedures / Treatments   Labs (all labs ordered are listed, but only abnormal results are displayed) Labs Reviewed  SEDIMENTATION RATE    EKG None  Radiology No  results found.  Procedures Procedures   Medications Ordered in ED Medications  HYDROcodone-acetaminophen (NORCO/VICODIN) 5-325 MG per tablet 1 tablet (has no administration in time range)  metoCLOPramide (REGLAN) injection 10 mg (10 mg Intravenous Given 07/26/20 1101)  diphenhydrAMINE (BENADRYL) injection 25 mg (25 mg Intravenous Given 07/26/20 1101)  fentaNYL (SUBLIMAZE) injection 50 mcg (50 mcg Intravenous Given 07/26/20 1316)    ED Course  I have reviewed  the triage vital signs and the nursing notes.  Pertinent labs & imaging results that were available during my care of the patient were reviewed by me and considered in my medical decision making (see chart for details).    MDM Rules/Calculators/A&P                          Patient is a 65 year old female who presents with a headache.  She does have a history of a prior AVM that was repaired in the 1980s.  2 days ago she was seen for the same headache and had a CT and CTA which was nonconcerning.  She has ongoing headache.  She does not have any worsening of the headache or new symptoms.  She does not have any neurologic deficits other than her baseline deficits.  She does not have any neck pain fevers or other symptoms that would be more suggestive of meningitis or subarachnoid hemorrhage.  She does not have any sinus tenderness.  She initially got some relief with a migraine cocktail but then it came back again.  She then was given dose of fentanyl and again she got relief but the headache came back again.  I spoke with the patient's sister who reports that the patient did have a similar headache that lasted 8 days about 5 years ago.  She reported that time it was resistant to medications and ultimately went away on its own.  I will go ahead and give the patient a prescription for short course of Vicodin.  She was encouraged to follow-up within the next few days with her PCP and also attempt to make an appointment with her neurologist.  Return precautions were given. Final Clinical Impression(s) / ED Diagnoses Final diagnoses:  Acute nonintractable headache, unspecified headache type    Rx / DC Orders ED Discharge Orders         Ordered    HYDROcodone-acetaminophen (NORCO/VICODIN) 5-325 MG tablet  Every 4 hours PRN        07/26/20 1412           Malvin Johns, MD 07/26/20 1414

## 2020-07-28 ENCOUNTER — Emergency Department (HOSPITAL_COMMUNITY)
Admission: EM | Admit: 2020-07-28 | Discharge: 2020-07-28 | Disposition: A | Discharge status: Home / Self Care | Payer: Medicare Other | Attending: Emergency Medicine | Admitting: Emergency Medicine

## 2020-07-28 ENCOUNTER — Encounter (HOSPITAL_COMMUNITY): Payer: Self-pay | Admitting: *Deleted

## 2020-07-28 ENCOUNTER — Other Ambulatory Visit: Payer: Self-pay

## 2020-07-28 DIAGNOSIS — I10 Essential (primary) hypertension: Secondary | ICD-10-CM | POA: Diagnosis not present

## 2020-07-28 DIAGNOSIS — H53149 Visual discomfort, unspecified: Secondary | ICD-10-CM | POA: Insufficient documentation

## 2020-07-28 DIAGNOSIS — G4489 Other headache syndrome: Secondary | ICD-10-CM | POA: Diagnosis not present

## 2020-07-28 DIAGNOSIS — R531 Weakness: Secondary | ICD-10-CM | POA: Insufficient documentation

## 2020-07-28 DIAGNOSIS — I129 Hypertensive chronic kidney disease with stage 1 through stage 4 chronic kidney disease, or unspecified chronic kidney disease: Secondary | ICD-10-CM | POA: Diagnosis not present

## 2020-07-28 DIAGNOSIS — N183 Chronic kidney disease, stage 3 unspecified: Secondary | ICD-10-CM | POA: Diagnosis not present

## 2020-07-28 DIAGNOSIS — R519 Headache, unspecified: Secondary | ICD-10-CM | POA: Diagnosis not present

## 2020-07-28 LAB — COMPREHENSIVE METABOLIC PANEL
ALT: 28 U/L (ref 0–44)
AST: 20 U/L (ref 15–41)
Albumin: 3.9 g/dL (ref 3.5–5.0)
Alkaline Phosphatase: 115 U/L (ref 38–126)
Anion gap: 8 (ref 5–15)
BUN: 27 mg/dL — ABNORMAL HIGH (ref 8–23)
CO2: 22 mmol/L (ref 22–32)
Calcium: 9.4 mg/dL (ref 8.9–10.3)
Chloride: 107 mmol/L (ref 98–111)
Creatinine, Ser: 1.2 mg/dL — ABNORMAL HIGH (ref 0.44–1.00)
GFR, Estimated: 51 mL/min — ABNORMAL LOW (ref >60)
Glucose, Bld: 125 mg/dL — ABNORMAL HIGH (ref 70–99)
Potassium: 3.7 mmol/L (ref 3.5–5.1)
Sodium: 137 mmol/L (ref 135–145)
Total Bilirubin: 0.8 mg/dL (ref 0.3–1.2)
Total Protein: 7.5 g/dL (ref 6.5–8.1)

## 2020-07-28 LAB — CBC WITH DIFFERENTIAL/PLATELET
Abs Immature Granulocytes: 0.03 10*3/uL (ref 0.00–0.07)
Basophils Absolute: 0 10*3/uL (ref 0.0–0.1)
Basophils Relative: 0 %
Eosinophils Absolute: 0.1 10*3/uL (ref 0.0–0.5)
Eosinophils Relative: 1 %
HCT: 42.3 % (ref 36.0–46.0)
Hemoglobin: 14.1 g/dL (ref 12.0–15.0)
Immature Granulocytes: 0 %
Lymphocytes Relative: 18 %
Lymphs Abs: 1.6 10*3/uL (ref 0.7–4.0)
MCH: 31.4 pg (ref 26.0–34.0)
MCHC: 33.3 g/dL (ref 30.0–36.0)
MCV: 94.2 fL (ref 80.0–100.0)
Monocytes Absolute: 0.8 10*3/uL (ref 0.1–1.0)
Monocytes Relative: 9 %
Neutro Abs: 6.7 10*3/uL (ref 1.7–7.7)
Neutrophils Relative %: 72 %
Platelets: 281 10*3/uL (ref 150–400)
RBC: 4.49 MIL/uL (ref 3.87–5.11)
RDW: 12.7 % (ref 11.5–15.5)
WBC: 9.3 10*3/uL (ref 4.0–10.5)
nRBC: 0 % (ref 0.0–0.2)

## 2020-07-28 MED ORDER — SODIUM CHLORIDE 0.9 % IV BOLUS
1000.0000 mL | Freq: Once | INTRAVENOUS | Status: AC
  Administered 2020-07-28: 1000 mL via INTRAVENOUS

## 2020-07-28 MED ORDER — METOCLOPRAMIDE HCL 5 MG/ML IJ SOLN
10.0000 mg | Freq: Once | INTRAMUSCULAR | Status: AC
  Administered 2020-07-28: 10 mg via INTRAVENOUS
  Filled 2020-07-28: qty 2

## 2020-07-28 MED ORDER — DIPHENHYDRAMINE HCL 50 MG/ML IJ SOLN
25.0000 mg | Freq: Once | INTRAMUSCULAR | Status: AC
  Administered 2020-07-28: 25 mg via INTRAVENOUS
  Filled 2020-07-28: qty 1

## 2020-07-28 MED ORDER — MAGNESIUM SULFATE 50 % IJ SOLN
2.0000 g | Freq: Once | INTRAMUSCULAR | Status: DC
  Filled 2020-07-28: qty 4

## 2020-07-28 MED ORDER — MAGNESIUM SULFATE 2 GM/50ML IV SOLN
2.0000 g | Freq: Once | INTRAVENOUS | Status: AC
  Administered 2020-07-28: 2 g via INTRAVENOUS
  Filled 2020-07-28: qty 50

## 2020-07-28 MED ORDER — VALPROATE SODIUM 100 MG/ML IV SOLN: 1000.0000 mg | Freq: Once | INTRAVENOUS | Status: DC

## 2020-07-28 MED ORDER — VALPROATE SODIUM 100 MG/ML IV SOLN
1000.0000 mg | Freq: Once | INTRAVENOUS | Status: AC
  Administered 2020-07-28: 1000 mg via INTRAVENOUS
  Filled 2020-07-28: qty 10

## 2020-07-28 NOTE — ED Provider Notes (Signed)
In short, patient goes home after Depacon with referral to neurology  Received patient as a handoff at shift change from [facility], PA-C.  Please see his note for full HPI, assessment, and plan.  In short, patient is a 65 year old female with past medical history significant for CVA with right-sided hemiparesis and seizure disorder.  She is complaining of headache that she has had for 12 days.  Describes it as being left frontal and left temporal regions.  It is constant and unchanged from onset 12 days ago.  She endorses photophobia and has had minimal improvement with migraine cocktails in the ED as well as prednisone.  No visual disturbances associated with her headache.  No new focal deficits.  She has already received CTA head and neck which showed chronic left MCA infarct, but no acute findings.  Sed rate obtained 07/26/2020 was within normal limits.  There was low suspicion for GCA at that time.  Physical exam per handoff provider was unremarkable.  She was treated with migraine cocktail here in the ED.  All of her laboratory work-up was reviewed and unremarkable.  Plan per handoff team was for Depacon treatment.  Regardless of whether or not there was improvement, plan was for discharge home with ambulatory referral to neurology for ongoing evaluation and management.   Physical Exam  BP (!) 150/73 (BP Location: Right Arm)   Pulse (!) 57   Temp 98.3 F (36.8 C) (Oral)   Resp 15   Wt 67.6 kg   SpO2 98%   BMI 24.05 kg/m   Physical Exam Vitals and nursing note reviewed. Exam conducted with a chaperone present.  Constitutional:      General: She is not in acute distress.    Appearance: Normal appearance.  HENT:     Head: Normocephalic and atraumatic.  Eyes:     General: No scleral icterus.    Conjunctiva/sclera: Conjunctivae normal.  Cardiovascular:     Rate and Rhythm: Normal rate.  Pulmonary:     Effort: Pulmonary effort is normal. No respiratory distress.  Skin:     General: Skin is dry.  Neurological:     Mental Status: She is alert and oriented to person, place, and time.     GCS: GCS eye subscore is 4. GCS verbal subscore is 5. GCS motor subscore is 6.  Psychiatric:        Mood and Affect: Mood normal.        Behavior: Behavior normal.        Thought Content: Thought content normal.      ED Course/Procedures     Procedures Results for orders placed or performed during the hospital encounter of 07/28/20  Comprehensive metabolic panel  Result Value Ref Range   Sodium 137 135 - 145 mmol/L   Potassium 3.7 3.5 - 5.1 mmol/L   Chloride 107 98 - 111 mmol/L   CO2 22 22 - 32 mmol/L   Glucose, Bld 125 (H) 70 - 99 mg/dL   BUN 27 (H) 8 - 23 mg/dL   Creatinine, Ser 1.20 (H) 0.44 - 1.00 mg/dL   Calcium 9.4 8.9 - 10.3 mg/dL   Total Protein 7.5 6.5 - 8.1 g/dL   Albumin 3.9 3.5 - 5.0 g/dL   AST 20 15 - 41 U/L   ALT 28 0 - 44 U/L   Alkaline Phosphatase 115 38 - 126 U/L   Total Bilirubin 0.8 0.3 - 1.2 mg/dL   GFR, Estimated 51 (L) >60 mL/min   Anion  gap 8 5 - 15  CBC with Differential  Result Value Ref Range   WBC 9.3 4.0 - 10.5 K/uL   RBC 4.49 3.87 - 5.11 MIL/uL   Hemoglobin 14.1 12.0 - 15.0 g/dL   HCT 42.3 36.0 - 46.0 %   MCV 94.2 80.0 - 100.0 fL   MCH 31.4 26.0 - 34.0 pg   MCHC 33.3 30.0 - 36.0 g/dL   RDW 12.7 11.5 - 15.5 %   Platelets 281 150 - 400 K/uL   nRBC 0.0 0.0 - 0.2 %   Neutrophils Relative % 72 %   Neutro Abs 6.7 1.7 - 7.7 K/uL   Lymphocytes Relative 18 %   Lymphs Abs 1.6 0.7 - 4.0 K/uL   Monocytes Relative 9 %   Monocytes Absolute 0.8 0.1 - 1.0 K/uL   Eosinophils Relative 1 %   Eosinophils Absolute 0.1 0.0 - 0.5 K/uL   Basophils Relative 0 %   Basophils Absolute 0.0 0.0 - 0.1 K/uL   Immature Granulocytes 0 %   Abs Immature Granulocytes 0.03 0.00 - 0.07 K/uL   CT Angio Head W or Wo Contrast  Result Date: 07/24/2020 CLINICAL DATA:  Headache.  History of AVM EXAM: CT ANGIOGRAPHY HEAD AND NECK TECHNIQUE: Multidetector CT  imaging of the head and neck was performed using the standard protocol during bolus administration of intravenous contrast. Multiplanar CT image reconstructions and MIPs were obtained to evaluate the vascular anatomy. Carotid stenosis measurements (when applicable) are obtained utilizing NASCET criteria, using the distal internal carotid diameter as the denominator. CONTRAST:  74mL OMNIPAQUE IOHEXOL 350 MG/ML SOLN COMPARISON:  None. FINDINGS: CT HEAD FINDINGS Brain: Chronic infarct left MCA territory with hypodensity and volume loss involving the basal ganglia and left frontal and temporal lobe. There is compensatory enlargement of the left lateral ventricle. Small areas of calcific density within the infarct may be vascular calcification or dystrophic calcification in infarct. Negative for acute hemorrhage, infarct, or mass lesion. Vascular: Negative for hyperdense vessel. Skull: Left-sided craniotomy.  No acute skeletal abnormality. Sinuses: Paranasal sinuses clear. Orbits: Negative Review of the MIP images confirms the above findings CTA NECK FINDINGS Aortic arch: Standard branching. Imaged portion shows no evidence of aneurysm or dissection. No significant stenosis of the major arch vessel origins. Right carotid system: Normal right carotid bifurcation. Negative for right carotid stenosis. Left carotid system: Normal left carotid bifurcation. Negative for carotid stenosis. Vertebral arteries: Normal vertebral arteries bilaterally without stenosis. Skeleton: Left craniotomy. Cervical spondylosis. No acute skeletal abnormality. Other neck: No soft tissue mass or adenopathy. Multiple small nodules in the thyroid all under 1 cm. Upper chest: Sub solid nodule right upper lobe 14 mm in diameter. No priors for comparison. Review of the MIP images confirms the above findings CTA HEAD FINDINGS Anterior circulation: Cavernous carotid widely patent with mild atherosclerotic calcification. Right anterior right middle  cerebral arteries normal bilaterally. Left anterior cerebral artery widely patent Left M1 segment widely patent. Left MCA bifurcation patent. There is a cluster of vessels in the left sylvian fissure compatible with residual AVM. This appears to drain into the vein of Labbe laterally. Posterior circulation: Normal posterior circulation without stenosis or occlusion or vascular malformation. Venous sinuses: Normal venous enhancement. Mild asymmetric enlargement left vein of Labbe. Anatomic variants: None Review of the MIP images confirms the above findings IMPRESSION: 1. Chronic left MCA infarct.  No acute infarct or hemorrhage 2. 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 3. 13 mm  sub solid nodule right upper lobe. Possible infection or neoplasm. Recommend CT chest with contrast for further evaluation. Electronically Signed   By: Franchot Gallo M.D.   On: 07/24/2020 10:41   CT ANGIO NECK W OR WO CONTRAST  Result Date: 07/24/2020 CLINICAL DATA:  Headache.  History of AVM EXAM: CT ANGIOGRAPHY HEAD AND NECK TECHNIQUE: Multidetector CT imaging of the head and neck was performed using the standard protocol during bolus administration of intravenous contrast. Multiplanar CT image reconstructions and MIPs were obtained to evaluate the vascular anatomy. Carotid stenosis measurements (when applicable) are obtained utilizing NASCET criteria, using the distal internal carotid diameter as the denominator. CONTRAST:  31mL OMNIPAQUE IOHEXOL 350 MG/ML SOLN COMPARISON:  None. FINDINGS: CT HEAD FINDINGS Brain: Chronic infarct left MCA territory with hypodensity and volume loss involving the basal ganglia and left frontal and temporal lobe. There is compensatory enlargement of the left lateral ventricle. Small areas of calcific density within the infarct may be vascular calcification or dystrophic calcification in infarct. Negative for acute hemorrhage, infarct, or mass lesion. Vascular: Negative  for hyperdense vessel. Skull: Left-sided craniotomy.  No acute skeletal abnormality. Sinuses: Paranasal sinuses clear. Orbits: Negative Review of the MIP images confirms the above findings CTA NECK FINDINGS Aortic arch: Standard branching. Imaged portion shows no evidence of aneurysm or dissection. No significant stenosis of the major arch vessel origins. Right carotid system: Normal right carotid bifurcation. Negative for right carotid stenosis. Left carotid system: Normal left carotid bifurcation. Negative for carotid stenosis. Vertebral arteries: Normal vertebral arteries bilaterally without stenosis. Skeleton: Left craniotomy. Cervical spondylosis. No acute skeletal abnormality. Other neck: No soft tissue mass or adenopathy. Multiple small nodules in the thyroid all under 1 cm. Upper chest: Sub solid nodule right upper lobe 14 mm in diameter. No priors for comparison. Review of the MIP images confirms the above findings CTA HEAD FINDINGS Anterior circulation: Cavernous carotid widely patent with mild atherosclerotic calcification. Right anterior right middle cerebral arteries normal bilaterally. Left anterior cerebral artery widely patent Left M1 segment widely patent. Left MCA bifurcation patent. There is a cluster of vessels in the left sylvian fissure compatible with residual AVM. This appears to drain into the vein of Labbe laterally. Posterior circulation: Normal posterior circulation without stenosis or occlusion or vascular malformation. Venous sinuses: Normal venous enhancement. Mild asymmetric enlargement left vein of Labbe. Anatomic variants: None Review of the MIP images confirms the above findings IMPRESSION: 1. Chronic left MCA infarct.  No acute infarct or hemorrhage 2. 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 3. 13 mm sub solid nodule right upper lobe. Possible infection or neoplasm. Recommend CT chest with contrast for further evaluation. Electronically Signed    By: Franchot Gallo M.D.   On: 07/24/2020 10:41    MDM   Patient has been evaluated by both the PA and the MD from prior team.  Plan was for discharge after treatment with Depacon, bolus of improvement.  However, she will need further evaluation work-up if she were to develop any neurologic symptoms during that interim.  She has already been worked up extensively for this ongoing headache.  She simply requires referral to neurology.  She has been seen by Santina Evans recently, so will provide referral to [facility] Neurology.  Patient denies any significant provement with the Depacon.  Ambulatory referral to [facility] Neurology and emphasizing importance of close outpatient follow-up.  Patient is hungry and states that she is ready for discharge.  Her  friend is at bedside and will bring her home.  She sees her primary care provider tomorrow.     Corena Herter, PA-C 07/28/20 1725    Carmin Muskrat, MD 07/29/20 [phone]

## 2020-07-28 NOTE — ED Notes (Signed)
Pt ambulated to bathroom with one person assist and cane

## 2020-07-28 NOTE — Discharge Instructions (Signed)
I have placed ambulatory referral to [facility] Neurology.  When they call you, please emphasized the importance of close outpatient follow-up.  I specifically asked that you be seen as soon as possible given that you have had intractable headache symptoms x12 days despite normal work-up here in the ED on multiple occasions.    Please continue to eat regularly and hydrate.  Tylenol as needed.  Return to the ED or seek immediate medical attention should you experience any new or worsening symptoms.

## 2020-07-28 NOTE — ED Triage Notes (Signed)
Pt is from [facility], she continues to have a headache ongoing for 12 days. This is her 3rd ED visit with same complaint. Left side of head pain with some HTN. History of strokes. 177/100-70-98% -18

## 2020-07-28 NOTE — ED Provider Notes (Signed)
[facility] DEPT Provider Note   CSN: [id number] Arrival date & time: 07/28/20  1036     History Chief Complaint  Patient presents with  . Headache    Tonya Thompson is a 64 y.o. female with a history of stroke causing and right hemiparesis, seizures (last seizure 1998), CKD stage III.  Patient presents with a chief complaint of headache.  Patient reports she has had this headache for the past 12 days.  Reports headache located to left frontal and left temple.  Patient reports that her headache has been constant and unchanged since the began 12 days prior.  She endorses photophobia.  She reports she has had minimal improvement in her symptoms with migraine cocktails in the emergency department as well as course of prednisone.  Patient reports that headache onset was gradual.  Patient denies any visual disturbances associated with her headache.  No new numbness or weakness to her extremities, facial asymmetry, slurred speech, nausea, vomiting, saddle anesthesia, bowel or bladder dysfunction.  Patient denies any recent falls or injuries.  Patient denies taking any blood thinners.  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  13 mm subsolid nodule noted to the right upper lobe of lung. Per chart review patient has CT chest with contrast ordered for 08/12/2020 by her primary care provider Dr. Melford Aase.    Per chart review patient was seen on 07/26/20 sed rate obtained at this visit was within normal limits.    HPI     Past Medical History:  Diagnosis Date  . Acute kidney injury ([place]) 09/03/2019  . Allergy    seasonal allergies  . Arthritis    LEFT hand  . AVM (arteriovenous malformation) brain   . Cataract    bilateral-not a surgical candidate at this time (07/23/2020)  . GERD  (gastroesophageal reflux disease)    on meds  . Hyperlipidemia    diet controlled  . Insulin resistance 02/16/2018  . Seizures ([place])    Grand Mal & Partial complex seizure disorder-last 1998  . Stroke [facility])    5 total so far (age [phone])  . Ureteral stone 05/10/2019    Patient Active Problem List   Diagnosis Date Noted  . History of colon polyps 06/26/2020  . History of renal stone 09/04/2019  . Medication management 02/16/2018  . Osteopenia 01/10/2018  . CKD (chronic kidney disease) stage 3, GFR 30-59 ml/min (HCC) 11/10/2017  . 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 Hyperlipidemia, mixed 06/01/2017  . Abnormal glucose 06/01/2017  . Vitamin D deficiency 06/01/2017  . Epilepsy, grand mal ([place]) 11/15/2016  . 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 Headache, unspecified headache type 04/14/2016  . Right hemiparesis ([place]) 12/29/2015    Past Surgical History:  Procedure Laterality Date  . BRAIN SURGERY  09/19/1978   at [facility], Dr. Leida Lauth  . COLONOSCOPY  2017   [place], [place]  . CYSTOSCOPY/URETEROSCOPY/HOLMIUM LASER/STENT PLACEMENT Left 05/10/2019   Procedure: CYSTOSCOPY/RETROGRADE/URETEROSCOPY/HOLMIUM LASER/STENT PLACEMENT;  Surgeon: Raynelle Bring, MD;  Location: [facility] ORS;  Service: Urology;  Laterality: Left;  . FOOT SURGERY Right 10/1985   ankle fusion, at [facility], Dr. Lorelee Cover  . POLYPECTOMY  2017   multiple adenomas  . TUBAL LIGATION  1981  . WISDOM TOOTH EXTRACTION       OB History  Gravida  0   Para  0   Term  0   Preterm  0   AB  0   Living  0     SAB  0   IAB  0   Ectopic  0   Multiple  0   Live Births  0           Family History  Problem Relation Age of Onset  . Heart disease Mother   . Heart failure Mother   . Heart attack Mother   . Diabetes Father   . Hyperlipidemia Father   . Dementia Father   . Prostate cancer Father   . Melanoma Father   . Colon polyps Father   . Hyperlipidemia Paternal Uncle    . Hypertension Paternal Uncle   . Dementia Paternal Uncle   . Heart attack Maternal Grandfather   . Stroke Paternal Grandmother   . CAD Maternal Uncle   . COPD Maternal Uncle        smoker  . Breast cancer Neg Hx   . Colon cancer Neg Hx   . Esophageal cancer Neg Hx   . Stomach cancer Neg Hx   . Rectal cancer Neg Hx     Social History   Tobacco Use  . Smoking status: Never Smoker  . Smokeless tobacco: Never Used  Vaping Use  . Vaping Use: Never used  Substance Use Topics  . Alcohol use: No  . Drug use: No    Home Medications Prior to Admission medications   Medication Sig Start Date End Date Taking? Authorizing Provider  baclofen (LIORESAL) 10 MG tablet Take 30 mg by mouth at bedtime.    [provider]  butalbital-acetaminophen-caffeine (FIORICET, ESGIC) 50-325-40 MG tablet Take 1 tablet by mouth 2 (two) times daily as needed for headache or migraine.     [provider]  calcium carbonate (TUMS - DOSED IN MG ELEMENTAL CALCIUM) 500 MG chewable tablet Chew 1 tablet by mouth daily.    [provider]  Calcium Citrate-Vitamin D (CALCIUM + D PO) Take 1 tablet by mouth daily.     [provider]  dexamethasone (DECADRON) 4 MG tablet Take 1 tab 3 x day - 3 days, then 2 x day - 3 days, then 1 tab daily 07/21/20   Unk Pinto, MD  fexofenadine (ALLEGRA) 180 MG tablet Take 180 mg by mouth daily.    [provider]  HYDROcodone-acetaminophen (NORCO/VICODIN) 5-325 MG tablet Take 1 tablet by mouth every 4 (four) hours as needed. 07/26/20   Malvin Johns, MD  Multiple Vitamins-Minerals (MULTIVITAMIN WITH MINERALS) tablet Take 1 tablet by mouth daily.     [provider]  Omega-3 Fatty Acids (FISH OIL PO) Take 1 capsule by mouth daily.    [provider]  POTASSIUM PO Take 1 tablet by mouth daily.    [provider]  Propylene Glycol (SYSTANE BALANCE) 0.6 % SOLN Place 1 drop into both eyes 2 (two) times daily.     [provider]  topiramate (TOPAMAX) 200 MG tablet Takes 1 &1/2 tabs 2 x /day    (3 tabs /day) Patient taking differently: Take 300 mg by mouth 2 (two) times daily. MD wants her to only take the American made Zy Topamax 02/19/18   Unk Pinto, MD  Vitamin D3 (VITAMIN D) 25 MCG tablet Take 1,000 Units by mouth daily.     [provider]    Allergies    Aspirin, Cheese, Chocolate,  Coffee flavor, Other, and Red wine complex [germanium]  Review of Systems   Review of Systems  Constitutional: Negative for chills and fever.  Eyes: Positive for photophobia. Negative for visual disturbance.  Respiratory: Negative for cough and shortness of breath.   Cardiovascular: Negative for chest pain.  Gastrointestinal: Negative for abdominal pain, nausea and vomiting.  Genitourinary: Negative for difficulty urinating and dysuria.  Musculoskeletal: Negative for back pain, neck pain and neck stiffness.  Skin: Negative for color change and rash.  Neurological: Positive for numbness (baseline to right side previous CVA) and headaches. Negative for dizziness, tremors, seizures, syncope, facial asymmetry, speech difficulty, weakness and light-headedness.  Psychiatric/Behavioral: Negative for confusion.    Physical Exam Updated Vital Signs BP (!) 153/85 (BP Location: Left Arm)   Pulse 66   Temp 98.3 F (36.8 C) (Oral)   Resp 17   Wt 67.6 kg   SpO2 98%   BMI 24.05 kg/m   Physical Exam Vitals and nursing note reviewed.  Constitutional:      General: She is not in acute distress.    Appearance: She is not ill-appearing, toxic-appearing or diaphoretic.  HENT:     Head: Normocephalic and atraumatic. No raccoon eyes, Battle's sign, abrasion, contusion, masses, right periorbital erythema, left periorbital erythema or laceration.     Jaw: No trismus or pain on movement.     Mouth/Throat:     Pharynx: Oropharynx is clear. Uvula midline.  Eyes:     General: No scleral icterus.        Right eye: No discharge.        Left eye: No discharge.     Extraocular Movements: Extraocular movements intact.     Pupils: Pupils are equal, round, and reactive to light.  Cardiovascular:     Rate and Rhythm: Normal rate.  Pulmonary:     Effort: Pulmonary effort is normal. No tachypnea, bradypnea or respiratory distress.     Breath sounds: No stridor.  Musculoskeletal:     Cervical back: Normal range of motion and neck supple. No rigidity.  Skin:    General: Skin is warm and dry.  Neurological:     General: No focal deficit present.     Mental Status: She is alert.     GCS: GCS eye subscore is 4. GCS verbal subscore is 5. GCS motor subscore is 6.     Cranial Nerves: No cranial nerve deficit or facial asymmetry.     Motor: No weakness, tremor or seizure activity.     Coordination: Romberg sign negative. Finger-Nose-Finger Test normal.     Gait: Gait is intact. Gait normal.     Comments: CN II-XII intact; performed in supine position, +5 strength to upper left extremitty, +5 strength to dorsiflexion and plantarflexion of left lower extremity, patient able to left both legs against gravity  Decreased sensation to light touch of right upper and lower extremity; baseline per patient  Weakness to right upper and lower extremity; baseline per patient  Psychiatric:        Behavior: Behavior is cooperative.     ED Results / Procedures / Treatments   Labs (all labs ordered are listed, but only abnormal results are displayed) Labs Reviewed  COMPREHENSIVE METABOLIC PANEL - Abnormal; Notable for the following components:      Result Value   Glucose, Bld 125 (*)    BUN 27 (*)    Creatinine, Ser 1.20 (*)    GFR, Estimated 51 (*)    All other  components within normal limits  CBC WITH DIFFERENTIAL/PLATELET    EKG None  Radiology No results found.  Procedures Procedures   Medications Ordered in ED Medications  valproate (DEPACON) 1,000 mg in dextrose 5 % 50 mL IVPB (1,000 mg  Intravenous New Bag/Given 07/28/20 1546)  metoCLOPramide (REGLAN) injection 10 mg (10 mg Intravenous Given 07/28/20 1333)  diphenhydrAMINE (BENADRYL) injection 25 mg (25 mg Intravenous Given 07/28/20 1332)  sodium chloride 0.9 % bolus 1,000 mL (1,000 mLs Intravenous New Bag/Given 07/28/20 1331)  magnesium sulfate IVPB 2 g 50 mL (0 g Intravenous Stopped 07/28/20 1517)    ED Course  I have reviewed the triage vital signs and the nursing notes.  Pertinent labs & imaging results that were available during my care of the patient were reviewed by me and considered in my medical decision making (see chart for details).    MDM Rules/Calculators/A&P                          Alert 65 year old female in no acute distress, nontoxic-appearing.  Patient presents with chief complaint of headache x12 days.  Headache is unchanged over this time period.  Patient has been evaluated in [facility] emergency departments twice during this time.  CTA head and neck obtained on 3/24 showed no acute infarct or hemorrhage.  Sed rate obtained on 3/26 within normal limits.  Patient received migraine cocktails both times with minimal improvement in her symptoms.  Patient reports that she attempted to set up follow-up with neurology in the outpatient setting however has been unable to be seen at this time.  Patient denies any change in her symptoms at present today.  No new numbness or weakness to her extremities, facial asymmetry, slurred speech, nausea, vomiting, saddle anesthesia, bowel or bladder dysfunction.  Patient denies any recent falls or injuries.  We will obtain basic lab work.  We will give patient migraine cocktail consisting of 1 L fluid bolus, Reglan, Benadryl and magnesium.  Plan to give patient Depacon if headache is not improved after initial cocktail.  On repeat examination patient reports mild improvement in her headache.  Will add Depacon at this time.  Unlikely to completely resolve headache at this time  as previous times with migraine cocktail as well as steroids have been unsuccessful.  If patient continues to have no new focal neurological deficits or complaints during her ED stay would discharge with neurology follow-up.    Patient care transferred to PA Green at the end of my shift. Patient presentation, ED course, and plan of care discussed with review of all pertinent labs and imaging. Please see his/her note for further details regarding further ED course and disposition.  Final Clinical Impression(s) / ED Diagnoses Final diagnoses:  Acute nonintractable headache, unspecified headache type    Rx / DC Orders ED Discharge Orders    None       Loni Beckwith, PA-C 07/28/20 1756    Little, Wenda Overland, MD 07/30/20 [id number]

## 2020-07-29 ENCOUNTER — Other Ambulatory Visit: Payer: Self-pay

## 2020-07-29 ENCOUNTER — Ambulatory Visit (INDEPENDENT_AMBULATORY_CARE_PROVIDER_SITE_OTHER): Payer: Medicare Other | Admitting: Adult Health Nurse Practitioner

## 2020-07-29 ENCOUNTER — Encounter: Payer: Self-pay | Admitting: Adult Health Nurse Practitioner

## 2020-07-29 VITALS — BP 134/80 | HR 87 | Temp 97.6°F | Ht 66.0 in | Wt 149.0 lb

## 2020-07-29 DIAGNOSIS — M26623 Arthralgia of bilateral temporomandibular joint: Secondary | ICD-10-CM

## 2020-07-29 DIAGNOSIS — G44209 Tension-type headache, unspecified, not intractable: Secondary | ICD-10-CM | POA: Diagnosis not present

## 2020-07-29 DIAGNOSIS — R0989 Other specified symptoms and signs involving the circulatory and respiratory systems: Secondary | ICD-10-CM

## 2020-07-29 NOTE — Progress Notes (Signed)
Hospital follow up   Assessment and Plan: Hospital visit follow up for Headaches. Tonya Thompson was seen today for er follow up.  Diagnoses and all orders for this visit:  Labile hypertension Continue current medications: Monitor blood pressure at home; call if consistently over 130/80 Continue DASH diet.   Reminder to go to the ER if any CP, SOB, nausea, dizziness, severe HA, changes vision/speech, left arm numbness and tingling and jaw pain.  Bilateral temporomandibular joint pain Headache related to this? Taking baclofen nightly Mouth guard?  Tension-type headache, not intractable, unspecified chronicity pattern Continue dexamethasone taper May take tylenol 1,000mg  Taking topamax 300mg  BID for seizures Patient has never been on triptans Has Neurology appointment in one day Gave sample of ODT Nurtec to rule out migraine vs MSK.   Defer labs today   Further disposition pending results if labs check today. Discussed med's effects and SE's.   Over 30 minutes of face to face interview, exam, counseling, chart review, and critical decision making was performed.    All medications were reviewed with patient and family and fully reconciled. All questions answered fully, and patient and family members were encouraged to call the office with any further questions or concerns. Discussed goal to avoid readmission related to this diagnosis.  Medications Discontinued During This Encounter  Medication Reason  . HYDROcodone-acetaminophen (NORCO/VICODIN) 5-325 MG tablet Completed Course    CAN NOT DO FOR BCBS REGULAR OR MEDICARE Over 40 minutes of exam, counseling, chart review, and complex, high/moderate level critical decision making was performed this visit.   Future Appointments  Date Time Provider [place]  08/06/2020  9:00 AM Armbruster, Carlota Raspberry, MD [facility]  08/12/2020 12:00 PM [facility] CT 1 [facility]  08/27/2020  2:50 PM [facility] MM 3 [facility]   09/15/2020 10:30 AM Cameron Sprang, MD LBN-[facility] None  09/24/2020 10:00 AM Liane Comber, NP [facility] None  06/26/2021  9:00 AM Liane Comber, NP [facility] None     HPI 65 y.o.female presents for follow up for transition from recent hospitalization. Admit date to the hospital was 07/28/20, patient was discharged from the hospital on 07/28/20 and our clinical staff contacted the office the day after discharge to set up a follow up appointment. The discharge summary, medications, and diagnostic test results were reviewed before meeting with the patient. The patient was evaluated for: acute nonintractable  One day ago and also seen on 07/26/20 and 07/24/20.  Reports that her previous neurologist gave her prednisone to help with her chronic migraines which reports.    She reports she has been to the ED, three times but nothing has help with her headache.  She reports that she has ben using warm rice heat pack that has helped some but it keeps returning.    She has taken hydrocodone?Discussed NOT taking this related to rebound headaches, Dexamethasone taper and taking this now.  She has also been taking two extra strength tylenol that helps some.   Reports she has had a massage at [facility] from therapist which has helped some.       She had CT Angio of head & neck.  She reports that they found a lunch nodule and a CT scan of the chest 08/12/20. Reports that she is having ain occipital, frontal and temporal that is constant, pounding 10+ on the pain scale.   Denies any nausea, she is eating and drinking.  She lives at [facility] and reports that she does not eat in the  dining room.  She is also having photophobia.  She is not sleeping well related to her headache.  Home health is not involved.   Images while in the hospital: No results found.   Current Outpatient Medications (Endocrine & Metabolic):  .  dexamethasone (DECADRON) 4 MG tablet, Take 1 tab 3 x day - 3 days, then 2 x day - 3  days, then 1 tab daily   Current Outpatient Medications (Respiratory):  .  fexofenadine (ALLEGRA) 180 MG tablet, Take 180 mg by mouth daily.  Current Outpatient Medications (Analgesics):  .  butalbital-acetaminophen-caffeine (FIORICET, ESGIC) 50-325-40 MG tablet, Take 1 tablet by mouth 2 (two) times daily as needed for headache or migraine.    Current Outpatient Medications (Other):  .  baclofen (LIORESAL) 10 MG tablet, Take 30 mg by mouth at bedtime. .  calcium carbonate (TUMS - DOSED IN MG ELEMENTAL CALCIUM) 500 MG chewable tablet, Chew 1 tablet by mouth daily. .  Calcium Citrate-Vitamin D (CALCIUM + D PO), Take 1 tablet by mouth daily.  .  Multiple Vitamins-Minerals (MULTIVITAMIN WITH MINERALS) tablet, Take 1 tablet by mouth daily.  .  Omega-3 Fatty Acids (FISH OIL PO), Take 1 capsule by mouth daily. Marland Kitchen  POTASSIUM PO, Take 1 tablet by mouth daily. Marland Kitchen  Propylene Glycol (SYSTANE BALANCE) 0.6 % SOLN, Place 1 drop into both eyes 2 (two) times daily. Marland Kitchen  topiramate (TOPAMAX) 200 MG tablet, Takes 1 &1/2 tabs 2 x /day    (3 tabs /day) (Patient taking differently: Take 300 mg by mouth 2 (two) times daily. MD wants her to only take the American made Zy Topamax) .  Vitamin D3 (VITAMIN D) 25 MCG tablet, Take 1,000 Units by mouth daily.   Past Medical History:  Diagnosis Date  . Acute kidney injury ([place]) 09/03/2019  . Allergy    seasonal allergies  . Arthritis    LEFT hand  . AVM (arteriovenous malformation) brain   . Cataract    bilateral-not a surgical candidate at this time (07/23/2020)  . GERD (gastroesophageal reflux disease)    on meds  . Hyperlipidemia    diet controlled  . Insulin resistance 02/16/2018  . Seizures ([place])    Grand Mal & Partial complex seizure disorder-last 1998  . Stroke [facility])    5 total so far (age [phone])  . Ureteral stone 05/10/2019     Allergies  Allergen Reactions  . Aspirin Other (See Comments)    CONTRAINDICATED DUE TO HISTORY OF BLEEDING IN BRAIN   . Cheese     Cant take due to history of severe migraines  . Chocolate     Cant take due to history of severe migraines  . Coffee Flavor     Cant take due to history of severe migraines  . Other      Nuts - Cant take due to history of severe migraines  . Red Wine Complex [Germanium]     Cant take due to history of severe migraines    ROS: all negative except above.   Physical Exam: Filed Weights   07/29/20 0939  Weight: 149 lb (67.6 kg)   BP 134/80   Pulse 87   Temp 97.6 F (36.4 C)   Ht 5\' 6"  (1.676 m)   Wt 149 lb (67.6 kg)   SpO2 97%   BMI 24.05 kg/m    General Appearance: Pale, in no apparent distress. Eyes: PERRLA, EOMs, conjunctiva no swelling or erythema Sinuses: No Frontal/maxillary tenderness ENT/Mouth: Ext aud canals  clear, TMs without erythema, bulging. No erythema, swelling, or exudate on post pharynx.  Tonsils not swollen or erythematous. Hearing normal.  Neck: Supple, thyroid normal.  Respiratory: Respiratory effort normal, BS equal bilaterally without rales, rhonchi, wheezing or stridor.   Cardio: RRR with no MRGs. Brisk peripheral pulses without edema.  Abdomen: Soft, + BS.  Non tender, no guarding, rebound, hernias, masses. Lymphatics: Non tender without lymphadenopathy.  Musculoskeletal: Full ROM, 5/5 strength, normal gait. RUE contracted (baseline).  TMJ tenderness.  Cervical spine tenderness to light touch. Skin: Warm, dry without rashes, lesions, ecchymosis.  Neuro: Cranial nerves intact. Normal muscle tone, no cerebellar symptoms. Sensation intact. Decreased RUE, baseline. Psych: Awake and oriented X 3, normal affect, Insight and Judgment appropriate.     Garnet Sierras, Laqueta Jean, DNP [facility] 07/29/2020  10:25 AM

## 2020-07-30 ENCOUNTER — Ambulatory Visit (INDEPENDENT_AMBULATORY_CARE_PROVIDER_SITE_OTHER): Payer: Medicare Other | Admitting: Neurology

## 2020-07-30 ENCOUNTER — Encounter: Payer: Self-pay | Admitting: Internal Medicine

## 2020-07-30 ENCOUNTER — Encounter: Payer: Self-pay | Admitting: Neurology

## 2020-07-30 VITALS — BP 164/89 | HR 88 | Ht 66.0 in | Wt 150.0 lb

## 2020-07-30 DIAGNOSIS — G40209 Localization-related (focal) (partial) symptomatic epilepsy and epileptic syndromes with complex partial seizures, not intractable, without status epilepticus: Secondary | ICD-10-CM

## 2020-07-30 DIAGNOSIS — G43001 Migraine without aura, not intractable, with status migrainosus: Secondary | ICD-10-CM

## 2020-07-30 DIAGNOSIS — Z8774 Personal history of (corrected) congenital malformations of heart and circulatory system: Secondary | ICD-10-CM | POA: Diagnosis not present

## 2020-07-30 MED ORDER — METOCLOPRAMIDE HCL 5 MG/ML IJ SOLN
10.0000 mg | Freq: Once | INTRAMUSCULAR | Status: AC
  Administered 2020-07-30: 10 mg via INTRAMUSCULAR

## 2020-07-30 MED ORDER — DIVALPROEX SODIUM ER 500 MG PO TB24: ORAL_TABLET | ORAL | 5 refills | Status: DC

## 2020-07-30 MED ORDER — PREDNISONE 20 MG PO TABS: ORAL_TABLET | ORAL | 0 refills | Status: DC

## 2020-07-30 MED ORDER — KETOROLAC TROMETHAMINE 60 MG/2ML IM SOLN
30.0000 mg | Freq: Once | INTRAMUSCULAR | Status: AC
  Administered 2020-07-30: 30 mg via INTRAMUSCULAR

## 2020-07-30 MED ORDER — DIPHENHYDRAMINE HCL 50 MG/ML IJ SOLN: 25.0000 mg | Freq: Once | INTRAMUSCULAR | Status: DC

## 2020-07-30 MED ORDER — DIPHENHYDRAMINE HCL 50 MG/ML IJ SOLN
25.0000 mg | Freq: Once | INTRAMUSCULAR | Status: AC
  Administered 2020-07-30: 25 mg via INTRAMUSCULAR

## 2020-07-30 MED ORDER — METOCLOPRAMIDE HCL 5 MG/ML IJ SOLN: 10.0000 mg | Freq: Once | INTRAVENOUS | Status: DC

## 2020-07-30 NOTE — Progress Notes (Signed)
Pt was given a headache cocktail she tolerated it well. Driver was in the waiting room waiting for her. Pt was advised to be careful when she gets home getting up and down that she may be sleepy.

## 2020-07-30 NOTE — Progress Notes (Signed)
NEUROLOGY CONSULTATION NOTE  Tonya Thompson MRN: [id number] DOB: 1955/09/05  Referring provider: Liane Comber, NP Primary care provider: Dr. Unk Pinto  Reason for consult:  History of seizures, worsening headaches   Thank you for your kind referral of Tonya Thompson for consultation of the above symptoms. Although her history is well known to you, please allow me to reiterate it for the purpose of our medical record. She is alone in the office, her sister Truddie Crumble who lives in [place] was on speakerphone to provide additional information. Records and images were personally reviewed where available.   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   1. Seizures. 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 She has been on Topiramate 370m BID for many years, switched to generic in 1999. Her last seizure was in 1998. [facility]. Per notes, she has abnormal EEGs with left hemisphere slowing, maximal left temporal central area with epileptiform activity. She denies any side effects on Topiramate. She denies any staring/unresponsive episodes, gaps in time, olfactory/gustatory hallucinations, myoclonic jerks. She had a normal birth and early development.  There is no history of febrile convulsions, CNS infections such as meningitis/encephalitis, or family history of seizures.  2. Status migrainosus. She has a history of migraines with prn Fioricet for rescue. She reports being migraine-free since 2017, however at that time she was also in status migrainosus with migraines that lasted 8 days. She recalls taking a 1-week  prednisone course with Dr. DMarlou Sawith resolution of migraines. This headache started 2 weeks ago, she reports 10/10 throbbing pain in the frontal regions, with tenderness to palpation. She has been nauseated, no vomiting. No visual obscurations. She is sensitive to lights. She was prescribed Prednisone 250m2 tab daily for 3 days, then 1 tab daily for 4 days. She is saying she was only taking 1 tablet daily and took it for only 3-4 days because it was not working. Headaches progressively worsened that she has been to the ER 3 times in the past week. In the ER, she has been given IV Reglan/Benadryl, IV Fentanyl, PO Decadron through her PCP, Vicodin from the ER, and most recently IV Depacon 100060mnd magnesium 2 days ago. She had a sample of Nurtec yesterday from PCP office which only made her sleep 2 hours but did not help with headache. She had a CTA head and neck on 07/24/20 which did not show any acute changes. There was a chronic left MCA infarct with small areas of calcific density within the infarct. There was a cluster of vessels in the left sylvian fissure compatible with residual AVM, appears to drain into the vein of [place]. ESR was normal (11). She states she stopped the Dexamethasone for a few days but took it again this morning. She states that she had done well with migraines by staying away from food triggers, she thinks there may have been something in her food that set this off. She manages her medications and denies missing doses. She has some soreness in her neck. She takes Baclofen for leg spasticity. She used to get 8 hours of sleep, but since headaches  recurred, she only gest 5 hours.    Lab Results  Component Value Date   WBC 9.3 07/28/2020   HGB 14.1 07/28/2020   HCT 42.3 07/28/2020   MCV 94.2 07/28/2020   PLT 281 07/28/2020     Chemistry      Component Value Date/Time   NA 137 07/28/2020 1258   K 3.7 07/28/2020 1258   CL 107 07/28/2020 1258   CO2 22 07/28/2020 1258   BUN 27  (H) 07/28/2020 1258   CREATININE 1.20 (H) 07/28/2020 1258   CREATININE 1.38 (H) 06/26/2020 1001      Component Value Date/Time   CALCIUM 9.4 07/28/2020 1258   ALKPHOS 115 07/28/2020 1258   AST 20 07/28/2020 1258   ALT 28 07/28/2020 1258   BILITOT 0.8 07/28/2020 1258       Lab Results  Component Value Date   ESRSEDRATE 11 07/26/2020    PAST MEDICAL HISTORY: Past Medical History:  Diagnosis Date  . Acute kidney injury ([place]) 09/03/2019  . Allergy    seasonal allergies  . Arthritis    LEFT hand  . AVM (arteriovenous malformation) brain   . Cataract    bilateral-not a surgical candidate at this time (07/23/2020)  . GERD (gastroesophageal reflux disease)    on meds  . Hyperlipidemia    diet controlled  . Insulin resistance 02/16/2018  . Seizures ([place])    Grand Mal & Partial complex seizure disorder-last 1998  . Stroke [facility])    5 total so far (age [phone])  . Ureteral stone 05/10/2019    PAST SURGICAL HISTORY: Past Surgical History:  Procedure Laterality Date  . BRAIN SURGERY  09/19/1978   at [facility], Dr. Leida Lauth  . COLONOSCOPY  2017   [place], [place]  . CYSTOSCOPY/URETEROSCOPY/HOLMIUM LASER/STENT PLACEMENT Left 05/10/2019   Procedure: CYSTOSCOPY/RETROGRADE/URETEROSCOPY/HOLMIUM LASER/STENT PLACEMENT;  Surgeon: Raynelle Bring, MD;  Location: [facility];  Service: Urology;  Laterality: Left;  . FOOT SURGERY Right 10/1985   ankle fusion, at [facility], Dr. Lorelee Cover  . POLYPECTOMY  2017   multiple adenomas  . TUBAL LIGATION  1981  . WISDOM TOOTH EXTRACTION      MEDICATIONS: Current Outpatient Medications on File Prior to Visit  Medication Sig Dispense Refill  . baclofen (LIORESAL) 10 MG tablet Take 30 mg by mouth at bedtime.    . butalbital-acetaminophen-caffeine (FIORICET, ESGIC) 50-325-40 MG tablet Take 1 tablet by mouth 2 (two) times daily as needed for headache or migraine.     . calcium carbonate (TUMS - DOSED IN MG ELEMENTAL CALCIUM) 500 MG chewable tablet Chew 1 tablet by  mouth daily.    . Calcium Citrate-Vitamin D (CALCIUM + D PO) Take 1 tablet by mouth daily.     Marland Kitchen dexamethasone (DECADRON) 4 MG tablet Take 1 tab 3 x day - 3 days, then 2 x day - 3 days, then 1 tab daily 30 tablet 0  . fexofenadine (ALLEGRA) 180 MG tablet Take 180 mg by mouth daily.    . Multiple Vitamins-Minerals (MULTIVITAMIN WITH MINERALS) tablet Take 1 tablet by mouth daily.     . Omega-3 Fatty Acids (FISH OIL PO) Take 1 capsule by mouth daily.    Marland Kitchen POTASSIUM PO Take 1 tablet by mouth daily.    Marland Kitchen Propylene Glycol (SYSTANE BALANCE) 0.6 % SOLN Place 1 drop into both eyes 2 (two) times daily.    Marland Kitchen topiramate (TOPAMAX) 200 MG tablet Takes 1 &1/2 tabs 2 x /day    (3 tabs /day) (Patient taking  differently: Take 300 mg by mouth 2 (two) times daily. MD wants her to only take the American made Zy Topamax) 270 tablet 1  . Vitamin D3 (VITAMIN D) 25 MCG tablet Take 1,000 Units by mouth daily.      No current facility-administered medications on file prior to visit.    ALLERGIES: Allergies  Allergen Reactions  . Aspirin Other (See Comments)    CONTRAINDICATED DUE TO HISTORY OF BLEEDING IN BRAIN  . Cheese     Cant take due to history of severe migraines  . Chocolate     Cant take due to history of severe migraines  . Coffee Flavor     Cant take due to history of severe migraines  . Other      Nuts - Cant take due to history of severe migraines  . Red Wine Complex [Germanium]     Cant take due to history of severe migraines    FAMILY HISTORY: Family History  Problem Relation Age of Onset  . Heart disease Mother   . Heart failure Mother   . Heart attack Mother   . Diabetes Father   . Hyperlipidemia Father   . Dementia Father   . Prostate cancer Father   . Melanoma Father   . Colon polyps Father   . Hyperlipidemia Paternal Uncle   . Hypertension Paternal Uncle   . Dementia Paternal Uncle   . Heart attack Maternal Grandfather   . Stroke Paternal Grandmother   . CAD Maternal Uncle    . COPD Maternal Uncle        smoker  . Breast cancer Neg Hx   . Colon cancer Neg Hx   . Esophageal cancer Neg Hx   . Stomach cancer Neg Hx   . Rectal cancer Neg Hx     SOCIAL HISTORY: Social History   Socioeconomic History  . Marital status: Single    Spouse name: Not on file  . Number of children: Not on file  . Years of education: Not on file  . Highest education level: Not on file  Occupational History  . Not on file  Tobacco Use  . Smoking status: Never Smoker  . Smokeless tobacco: Never Used  Vaping Use  . Vaping Use: Never used  Substance and Sexual Activity  . Alcohol use: No  . Drug use: No  . Sexual activity: Not Currently    Birth control/protection: None    Comment: 1st intercourse 65 yo-Fewer than 5 partners  Other Topics Concern  . Not on file  Social History Narrative   Right handed until 3rd grade    Left handed   Lives a friends [facility] [address] independent living    Social Determinants of Health   Financial Resource Strain: Not on file  Food Insecurity: Not on file  Transportation Needs: Not on file  Physical Activity: Not on file  Stress: Not on file  Social Connections: Not on file  Intimate Partner Violence: Not on file     PHYSICAL EXAM: Vitals:   07/30/20 1251  BP: (!) 164/89  Pulse: 88  SpO2: 97%   General: uncomfortable due to 10/10 headache Head:  Normocephalic/atraumatic, tenderness over the frontal and temporal regions Skin/Extremities: No rash, no edema Neurological Exam: Mental status: alert and awake, no dysarthria or aphasia, Fund of knowledge is appropriate.  Recent and remote memory are intact.  Attention and concentration are reduced due to pain. Cranial nerves: CN I: not tested CN II: pupils equal, round and  reactive to light, right inferior quadrantanopia CN III, IV, VI:  full range of motion, no nystagmus, no ptosis CN V: facial sensation intact CN VII: upper and lower face symmetric CN VIII: hearing intact to  conversation Bulk & Tone: normal, no fasciculations. Motor: 5/5 on left UE and LE. Spastic contracture on right UE with 4/5 left proximal UE and LE, 0/5 wrist and finger extension, right foot in brace due to foot drop.  Sensation: decreased to light touch, cold, pin, vibration sense on right side. 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 Per notes, prior EEG showed left hemisphere slowing, maximal at left temporal central area with epileptiform activity.  She has been seizure-free since 1998 on Topiramate 364m BID. Her main concern today is status migrainosus, she had a similar episode in 2017 and had been migraine-free until 2 weeks ago. Her neurological exam shows baseline right hemiplegia. Head CT no acute changes, CTA showed cluster of vessels in the left sylvian fissure compatible with residual AVM. She has tried several migraine cocktails, Dexamethasone, 1 dose IV Depacon. Will try Toradol today in addition to metoclopramide and diphenydramine, as well as starting daily Depakote ER 5063mqhs. It appears she would not take the steroid taper as prescribed because they would not help, we will start Prednisone 6037mith tapering instructions, she was instructed to finish the week course as directed. Follow-up in 1 month, call for any changes.   Thank you for allowing me to participate in the care of this patient. Please do not hesitate to call for any questions or concerns.   KarEllouise Newer.D.  CC: Dr. McKMelford AaseshLiane ComberP

## 2020-07-30 NOTE — Progress Notes (Signed)
Has had a headache for 2 weeks asking for something to help. Also nauseous at 1st thought it was from not having a BM but was able to have one now thinking it from the headache. Pt would like for you to talk to her her sister via phone,

## 2020-07-30 NOTE — Patient Instructions (Signed)
1. Migraine cocktail will be given in the office today  2. Start Prednisone 20mg : Take 3 tabs on day 1, 2 and 1/2 tabs on day 2,  2 tabs on day 3,  1 and 1/2 tabs on day 4,  1 tab on day 5,  1/2 tab on days 6 and 7, then stop.  2. Stop the Dexamethasone  3. Start Depakote ER 500mg  every night  4. Continue Topiramate 200mg  1.5 tabs twice a day  5. Follow-up in 1 month, call for any changes

## 2020-08-04 ENCOUNTER — Telehealth: Payer: Self-pay | Admitting: Neurology

## 2020-08-04 DIAGNOSIS — R930 Abnormal findings on diagnostic imaging of skull and head, not elsewhere classified: Secondary | ICD-10-CM

## 2020-08-04 DIAGNOSIS — G8191 Hemiplegia, unspecified affecting right dominant side: Secondary | ICD-10-CM

## 2020-08-04 DIAGNOSIS — R519 Headache, unspecified: Secondary | ICD-10-CM

## 2020-08-04 DIAGNOSIS — G40219 Localization-related (focal) (partial) symptomatic epilepsy and epileptic syndromes with complex partial seizures, intractable, without status epilepticus: Secondary | ICD-10-CM

## 2020-08-04 MED ORDER — DIVALPROEX SODIUM ER 500 MG PO TB24: 500.0000 mg | ORAL_TABLET | Freq: Two times a day (BID) | ORAL | 4 refills | Status: DC

## 2020-08-04 NOTE — Telephone Encounter (Signed)
Pls have her increase the Depakote 500mg : Take 1 tab BID. Can take first dose now. It may make her sleepy but hopefully can help with headaches more. Pls change Rx if she agrees to increase. Also, pls let her know I'd like to do an MRI, pls order MRI brain with and without cotnrast, and MRV without contrast. Dx: worsening headaches. thanks

## 2020-08-04 NOTE — Addendum Note (Signed)
Addended by: Jake Seats on: 08/04/2020 10:23 AM   Modules accepted: Orders

## 2020-08-04 NOTE — Telephone Encounter (Signed)
Pt stated that she is still having headaches all over using ice packs. Stated that pressure is bad 8 out of 10 and goes up to 10 + she said that she is not sure of what is going on and why it will not go away.

## 2020-08-04 NOTE — Telephone Encounter (Signed)
Patient is requesting a call back from the nurse. She said she's been having headaches constantly for 3 weeks for more. She said she is almost finished with the prednisone and has had no relief.   Please call.

## 2020-08-04 NOTE — Telephone Encounter (Signed)
Pt called and informed to increase the Depakote 500mg : Take 1 tab BID. Can take first dose now. It may make her sleepy but hopefully can help with headaches more.Also, informed that Dr Delice Lesch would like to do an  MRI brain with and without cotnrast, and MRV without contrast. Pt verbalized understanding new RX sent to [facility]. Orders placed in epic

## 2020-08-06 ENCOUNTER — Encounter: Payer: Medicare Other | Admitting: Gastroenterology

## 2020-08-06 ENCOUNTER — Other Ambulatory Visit: Payer: Self-pay

## 2020-08-06 MED ORDER — BACLOFEN 10 MG PO TABS: ORAL_TABLET | ORAL | 0 refills | Status: DC

## 2020-08-06 MED ORDER — TOPIRAMATE 200 MG PO TABS: ORAL_TABLET | ORAL | 1 refills | Status: DC

## 2020-08-11 ENCOUNTER — Other Ambulatory Visit: Payer: Self-pay

## 2020-08-11 ENCOUNTER — Telehealth: Payer: Self-pay | Admitting: Neurology

## 2020-08-11 DIAGNOSIS — R519 Headache, unspecified: Secondary | ICD-10-CM

## 2020-08-11 MED ORDER — BUTALBITAL-APAP-CAFFEINE 50-325-40 MG PO TABS: ORAL_TABLET | ORAL | 5 refills | Status: DC

## 2020-08-11 MED ORDER — DIVALPROEX SODIUM ER 500 MG PO TB24: ORAL_TABLET | ORAL | 4 refills | Status: DC

## 2020-08-11 NOTE — Telephone Encounter (Signed)
Spoke to the pt informed her that there is room to increase the Depakote, if she is not having side effects and she wishes to increase. Can increase Depakote 500mg : Take 1 tab in AM, 1 and 1/2 tabs in PM. Script called into the pharmacy

## 2020-08-11 NOTE — Telephone Encounter (Signed)
Patient is requesting a refill of generic Fioricet sent to [facility].

## 2020-08-11 NOTE — Telephone Encounter (Signed)
There is room to increase the Depakote, if she is not having side effects and she wishes to increase. Can increase Depakote 500mg : Take 1 tab in AM, 1 and 1/2 tabs in PM. Thanks

## 2020-08-11 NOTE — Telephone Encounter (Signed)
Pls let her know that I will send refills, but we only prescribe 10 tablets a month of this medication. How is she feeling? I'll send in the Rx, thanks

## 2020-08-11 NOTE — Telephone Encounter (Signed)
Patient called and left message returning Heather's call

## 2020-08-11 NOTE — Telephone Encounter (Addendum)
Pt stated that she is feeling better a 4 or a 5  Out of 10, in the evening she hurts more she uses a heating pad and a rice pack she is sleeping more Pt informed that Pt Delice Lesch called In her script,.

## 2020-08-12 ENCOUNTER — Ambulatory Visit
Admission: RE | Admit: 2020-08-12 | Discharge: 2020-08-12 | Disposition: A | Discharge status: Home / Self Care | Payer: Medicare Other | Source: Ambulatory Visit | Attending: Internal Medicine | Admitting: Internal Medicine

## 2020-08-12 DIAGNOSIS — Q676 Pectus excavatum: Secondary | ICD-10-CM | POA: Diagnosis not present

## 2020-08-12 DIAGNOSIS — M19011 Primary osteoarthritis, right shoulder: Secondary | ICD-10-CM | POA: Diagnosis not present

## 2020-08-12 DIAGNOSIS — M954 Acquired deformity of chest and rib: Secondary | ICD-10-CM | POA: Diagnosis not present

## 2020-08-12 DIAGNOSIS — R911 Solitary pulmonary nodule: Secondary | ICD-10-CM

## 2020-08-12 DIAGNOSIS — J929 Pleural plaque without asbestos: Secondary | ICD-10-CM | POA: Diagnosis not present

## 2020-08-12 IMAGING — CT CT CHEST W/ CM
2 of 4 series · 10 of 36 positions shown, 12 images · IV contrast (iopamidol)
Comparison: CT neck [DATE]

CLINICAL DATA: Follow-up pulmonary nodule

EXAM:
CT CHEST WITH CONTRAST
TECHNIQUE: Multidetector CT imaging of the chest was performed during
intravenous contrast administration.
CONTRAST:  75mL [VA] IOPAMIDOL ([VA]) INJECTION 61%

[Series 2: chest 2.00 br40 s3 · axial · 0.47mm/px · z∈[+1531,+1789]mm · 7 of 153 slices shown, 9 images (1 of 2)]
[im 12/153  mediastinal]
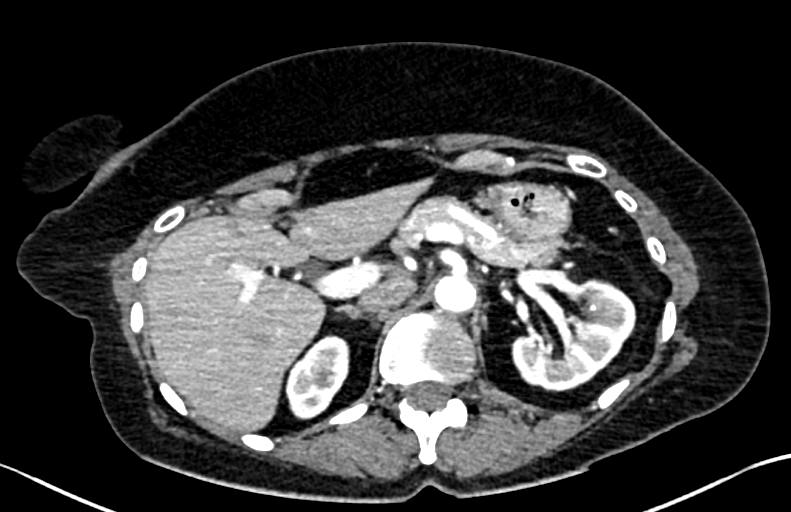
[im 12/153  lung]
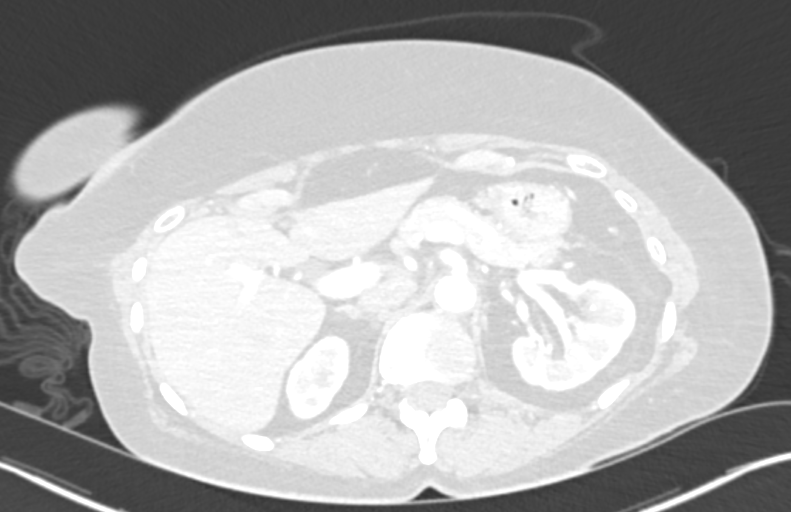
[im 36/153  lung]
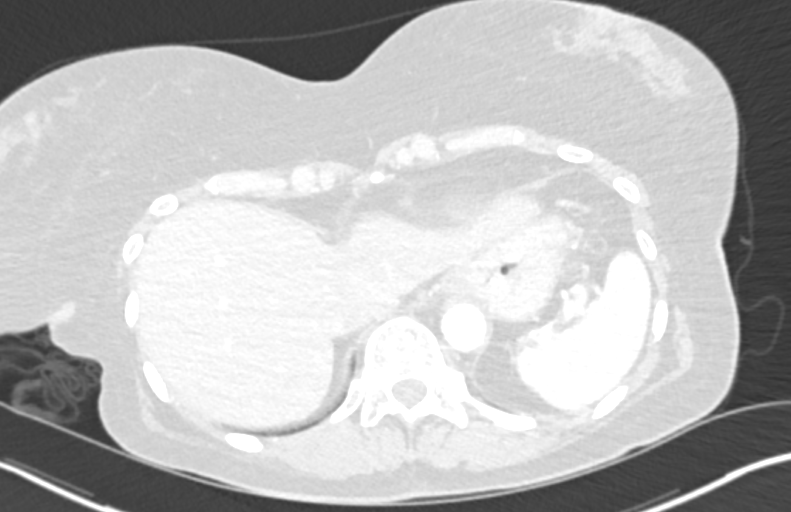
[im 59/153  lung]
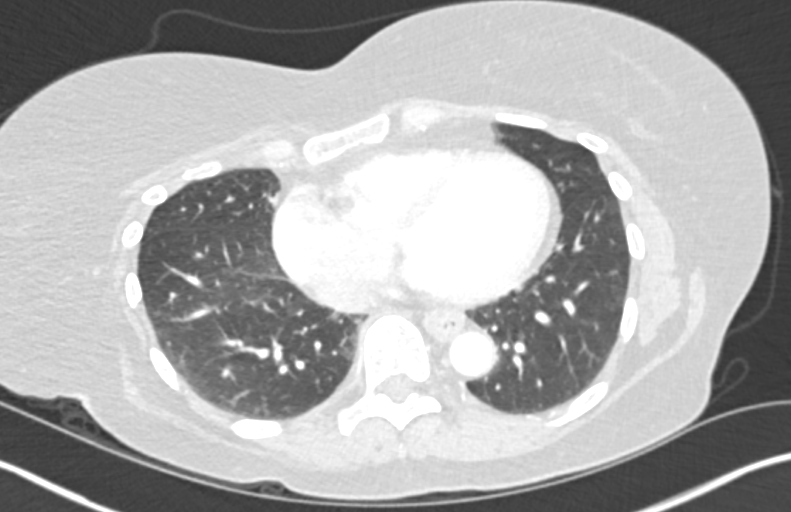
[im 82/153  lung]
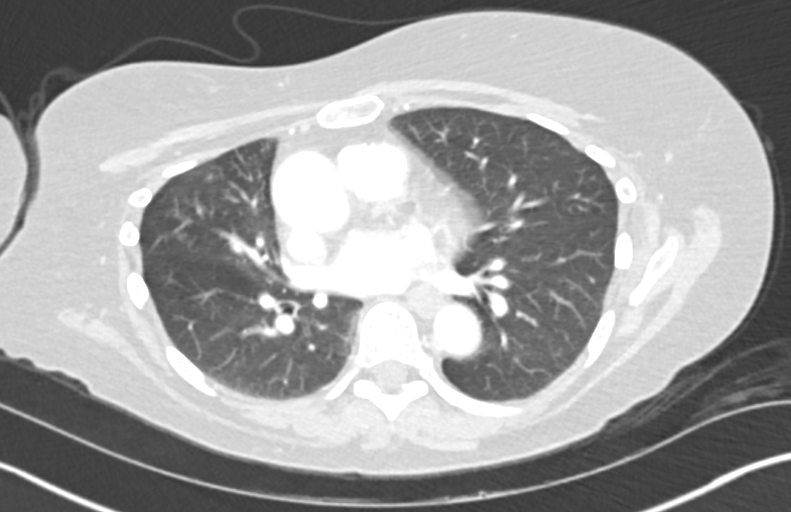
[im 94/153  mediastinal]
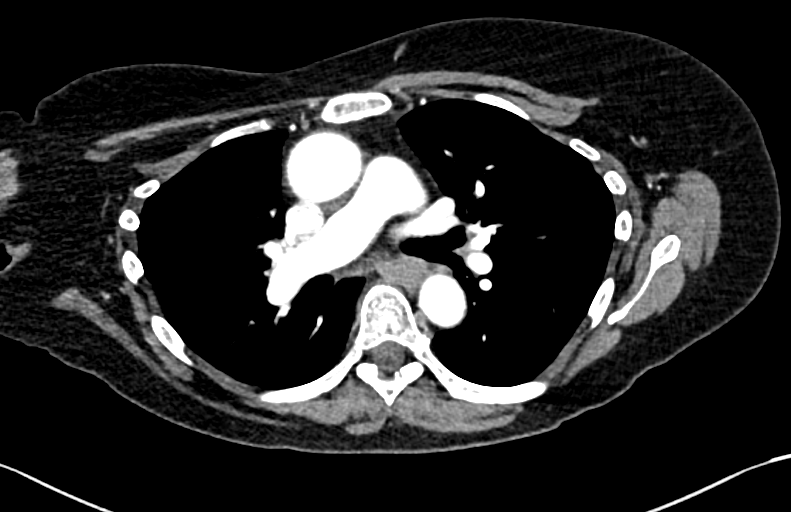
[im 94/153  lung]
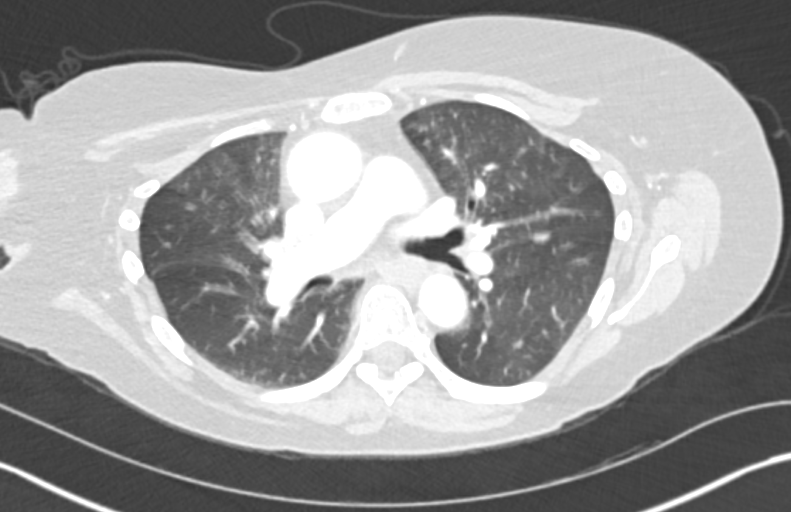
[im 117/153  lung]
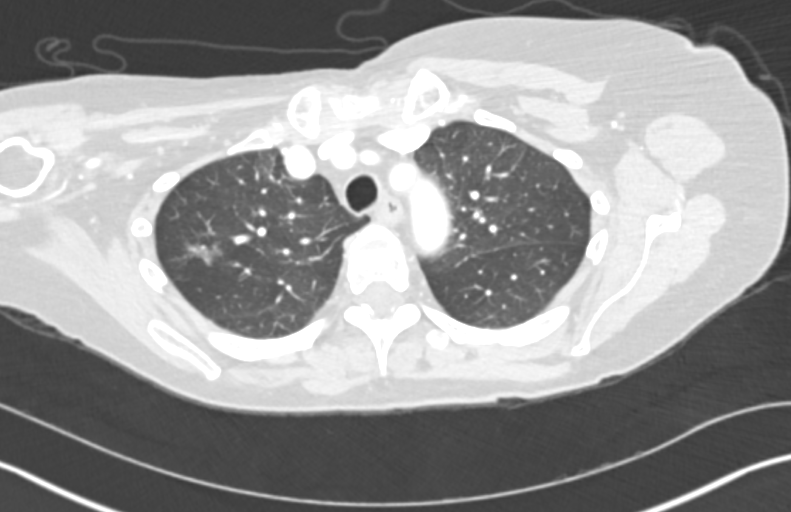
[im 141/153  lung]
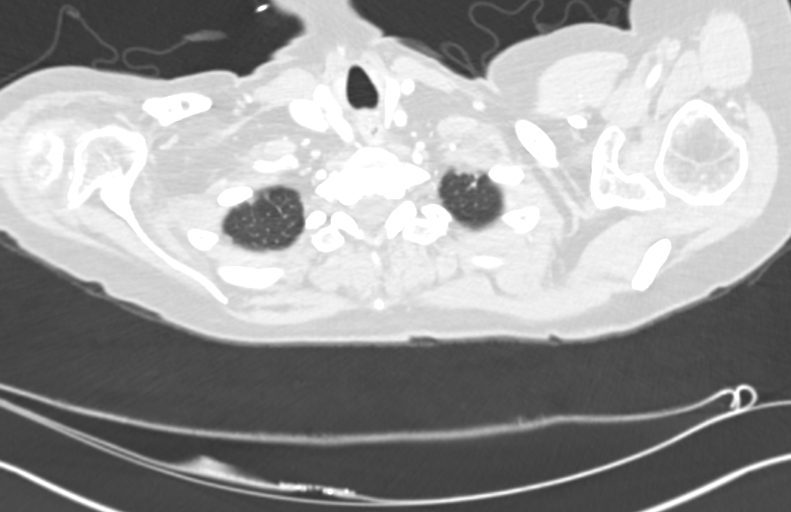

[Series 4: chest 2.00 br40 s3 · coronal · 0.60mm/px · 3 of 121 slices shown (2 of 2)]
[im 25/121  lung]
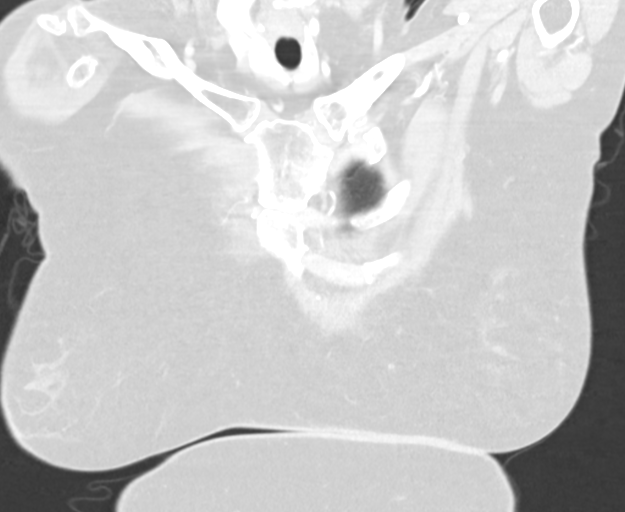
[im 49/121  lung]
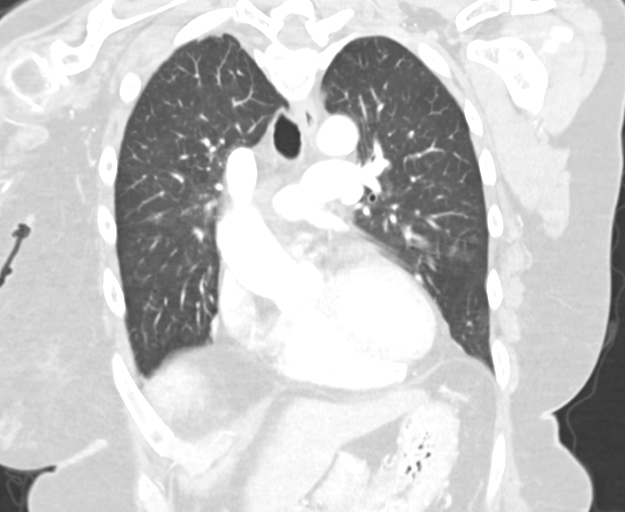
[im 73/121  lung]
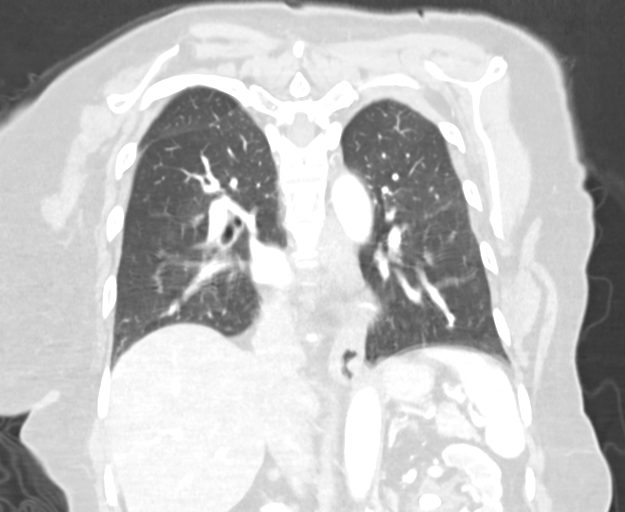

[10 of 36 positions shown; findings below may reference images not displayed]

FINDINGS: Cardiovascular: Heart size is normal. No pericardial effusion.
Thoracic aorta normal in course and caliber. Main pulmonary trunk is
nondilated.

Mediastinum/Nodes: No enlarged axillary, mediastinal, or hilar lymph
nodes. No suspicious thyroid nodule. Trachea and esophagus within
normal limits.

Lungs/Pleura: Part solid nodule in the right upper lobe measuring 14
mm in diameter (series 4, image 60; series 8, image 34). Irregular
solid component with calculated mean diameter of 7 mm. Slight mosaic
attenuation of the bilateral lung fields. Peri fissural nodule
within the anterior left lower lobe measuring 8 x 4 mm (series 8,
image 61). 5 mm left lower lobe nodule (series 8, image 77).
Bronchial wall thickening centrally. No pleural effusion or
pneumothorax.

Upper Abdomen: Large ovoid 1.4 x 1.2 cm stone within the left renal
pelvis with associated urothelial enhancement and stranding.
Additional small bilateral renal calculi.

Musculoskeletal: Slight pectus excavatum deformity. Moderate right
glenohumeral osteoarthritis. No acute osseous abnormality. No
suspicious bone lesion.
IMPRESSION: 1. Part solid nodule in the right upper lobe measuring 14 mm in
diameter with irregular solid component measuring 7 mm. Findings are
suspicious for primary bronchogenic malignancy. Follow-up
non-contrast CT recommended at 3-6 months to confirm persistence. If
persistent these nodules should be considered highly suspicious if
the solid component of the nodule is 6 mm or greater in size and
enlarging. This recommendation follows the consensus statement:
Guidelines for Management of Incidental Pulmonary Nodules Detected
[DATE].
2. Additional scattered pulmonary nodules including 8 mm
perifissural left lower lobe nodule. Attention on previously
recommended follow-up.
3. Large 1.4 x 1.2 cm stone within the left renal pelvis with
associated urothelial enhancement and stranding. Correlate with
urinalysis to exclude infection.
4. Additional small bilateral renal calculi.
5. Slight mosaic attenuation of the bilateral lung fields, which can
be seen in the setting of small airways disease.

## 2020-08-12 MED ORDER — IOPAMIDOL (ISOVUE-300) INJECTION 61%
75.0000 mL | Freq: Once | INTRAVENOUS | Status: AC | PRN
  Administered 2020-08-12: 75 mL via INTRAVENOUS

## 2020-08-13 ENCOUNTER — Other Ambulatory Visit: Payer: Self-pay | Admitting: Internal Medicine

## 2020-08-13 DIAGNOSIS — R911 Solitary pulmonary nodule: Secondary | ICD-10-CM

## 2020-08-13 NOTE — Progress Notes (Signed)
============================================================ ============================================================    CT scan confirms Nodule in Right Upper Lung &                                          radiologist recommends repeat CT scan in 6 months.   ============================================================ ============================================================

## 2020-08-14 ENCOUNTER — Other Ambulatory Visit: Payer: Self-pay | Admitting: Internal Medicine

## 2020-08-14 DIAGNOSIS — R911 Solitary pulmonary nodule: Secondary | ICD-10-CM

## 2020-08-19 ENCOUNTER — Telehealth: Payer: Self-pay | Admitting: Neurology

## 2020-08-19 NOTE — Telephone Encounter (Signed)
Noted, happy to hear this

## 2020-08-19 NOTE — Telephone Encounter (Signed)
Patient called and said she wants Dr. Delice Lesch to know she is feeling great. Her blood pressure is down, her headaches have almost gone completely away, and she is sleeping well.  FYI only.

## 2020-08-20 ENCOUNTER — Ambulatory Visit
Admission: RE | Admit: 2020-08-20 | Discharge: 2020-08-20 | Disposition: A | Discharge status: Home / Self Care | Payer: Medicare Other | Source: Ambulatory Visit | Attending: Neurology | Admitting: Neurology

## 2020-08-20 ENCOUNTER — Other Ambulatory Visit: Payer: Self-pay

## 2020-08-20 DIAGNOSIS — G40219 Localization-related (focal) (partial) symptomatic epilepsy and epileptic syndromes with complex partial seizures, intractable, without status epilepticus: Secondary | ICD-10-CM

## 2020-08-20 DIAGNOSIS — R519 Headache, unspecified: Secondary | ICD-10-CM

## 2020-08-20 DIAGNOSIS — G8191 Hemiplegia, unspecified affecting right dominant side: Secondary | ICD-10-CM

## 2020-08-20 DIAGNOSIS — R930 Abnormal findings on diagnostic imaging of skull and head, not elsewhere classified: Secondary | ICD-10-CM

## 2020-08-20 IMAGING — MR MR MRV HEAD W/O CM
3 series · 23 of 48 positions shown · non-contrast
Comparison: None.

CONTRAST:  15mL MULTIHANCE GADOBENATE DIMEGLUMINE 529 MG/ML IV SOLN

CLINICAL DATA: Headaches and hearing loss

EXAM:
MRI HEAD WITH AND WITHOUT CONTRAST
MRV HEAD WITHOUT CONTRAST
TECHNIQUE: Multiplanar, multiecho pulse sequences of the brain and surrounding
structures were obtained with and without intravenous contrast.
Angiographic images of the intracranial venous structures were
obtained using MRV technique without intravenous contrast.

[Series 4: (id) coronal · coronal · 3.0mm · 0.49mm/px · 11 of 105 slices shown]
[im 1/105]
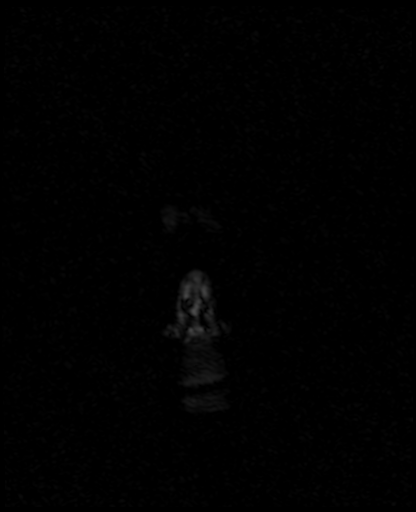
[im 6/105]
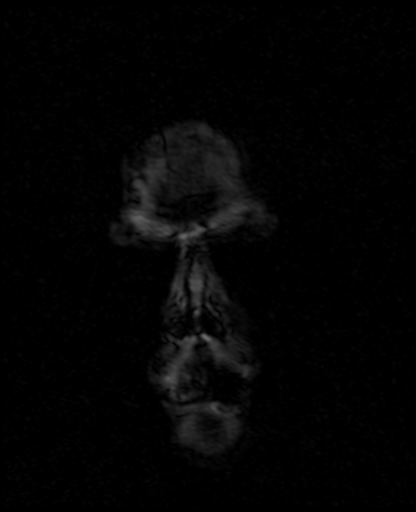
[im 11/105]
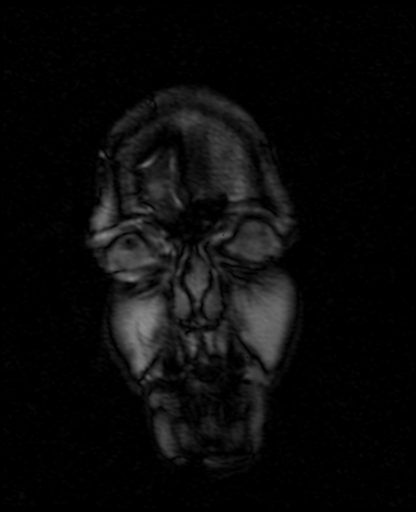
[im 17/105]
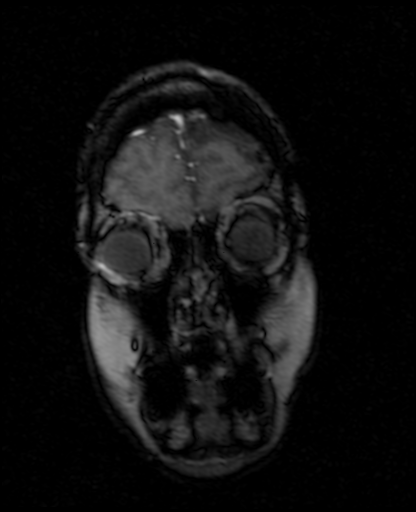
[im 33/105]
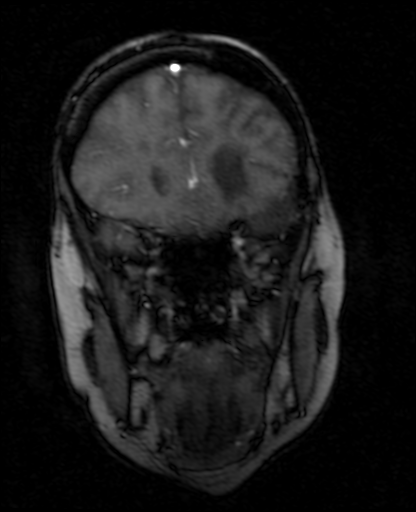
[im 44/105]
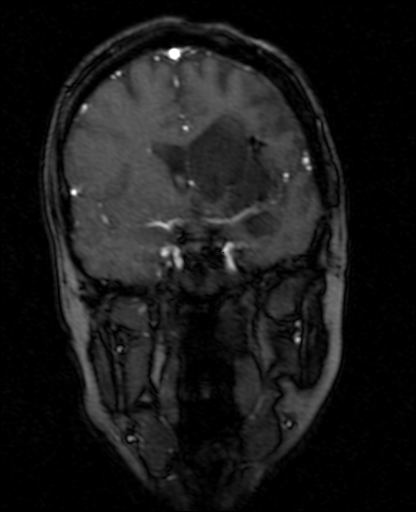
[im 55/105]
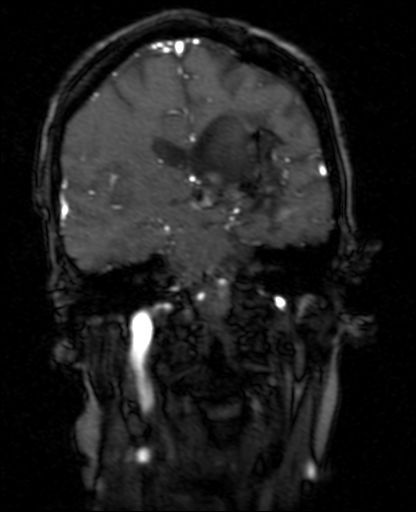
[im 61/105]
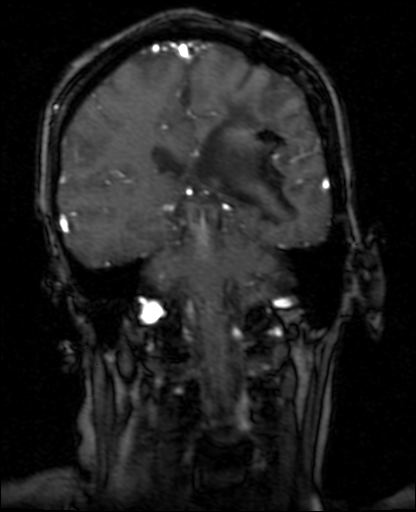
[im 72/105]
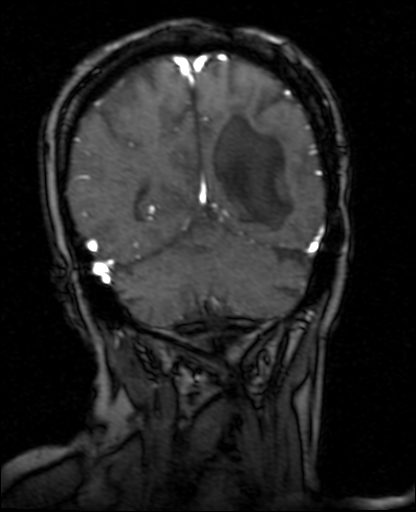
[im 88/105]
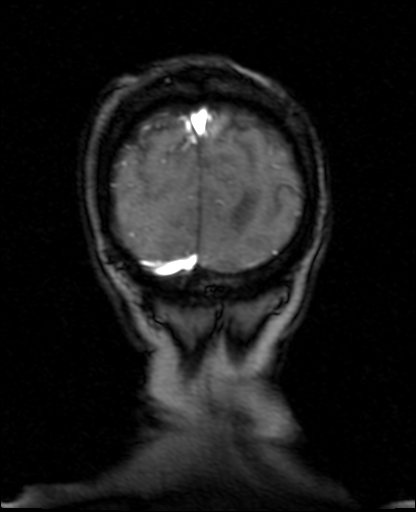
[im 99/105]
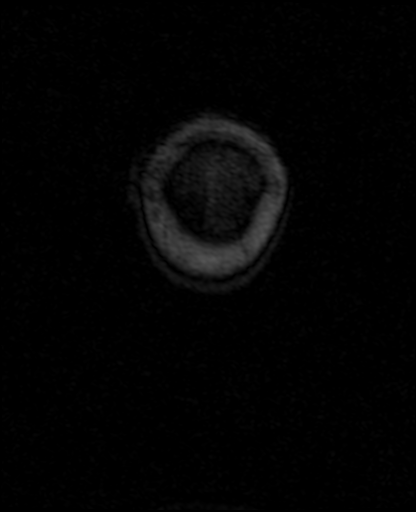

[Series 10: tof_3d cor · coronal · 1.5mm · 0.43mm/px · 11 of 140 slices shown]
[im 6/140]
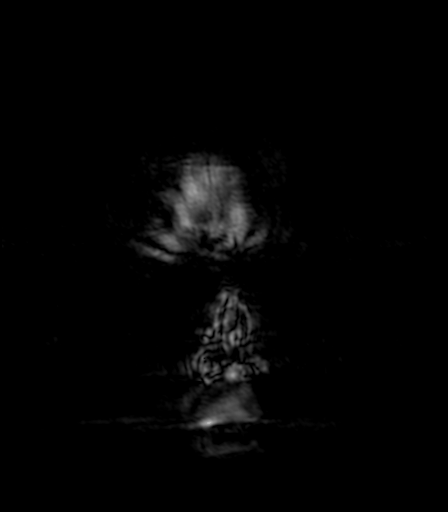
[im 22/140]
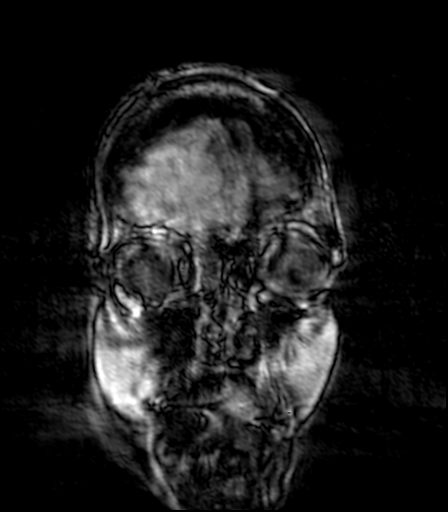
[im 27/140]
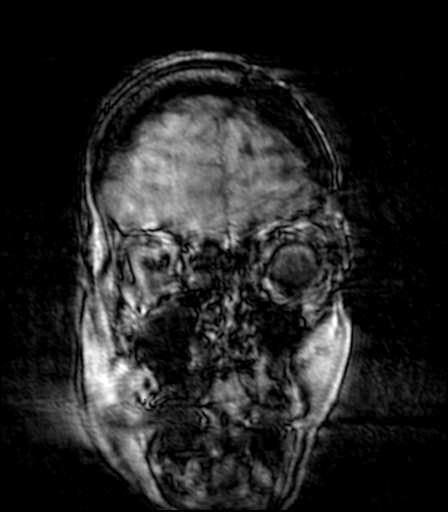
[im 43/140]
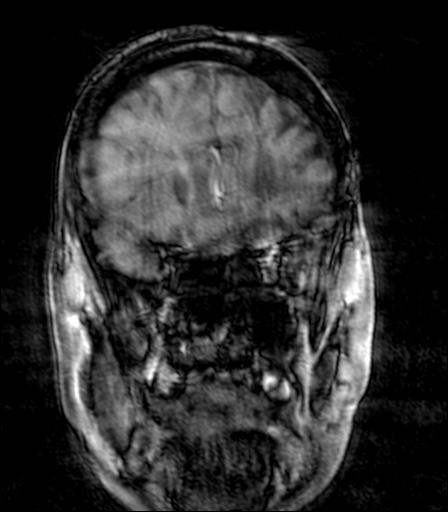
[im 59/140]
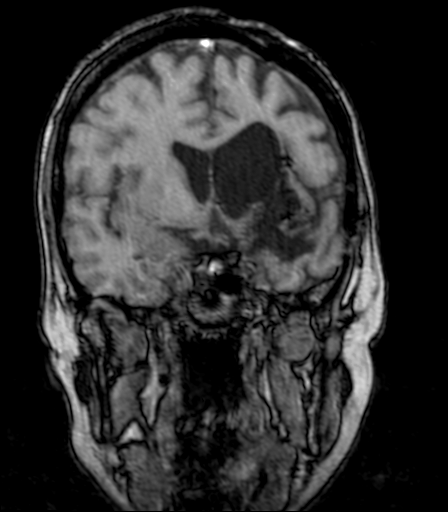
[im 70/140]
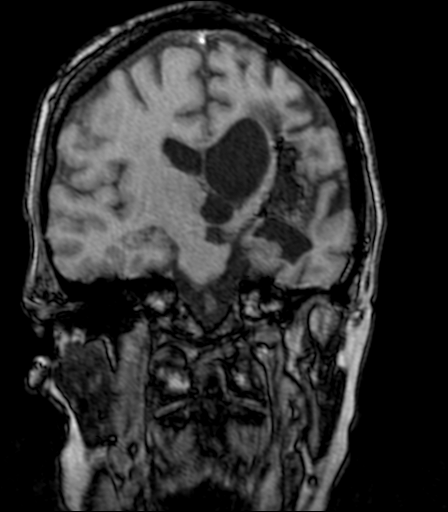
[im 81/140]
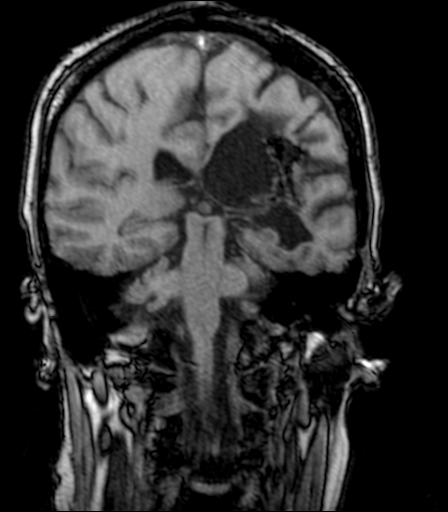
[im 97/140]
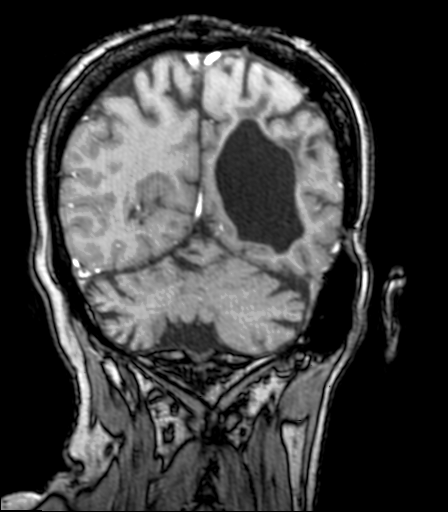
[im 113/140]
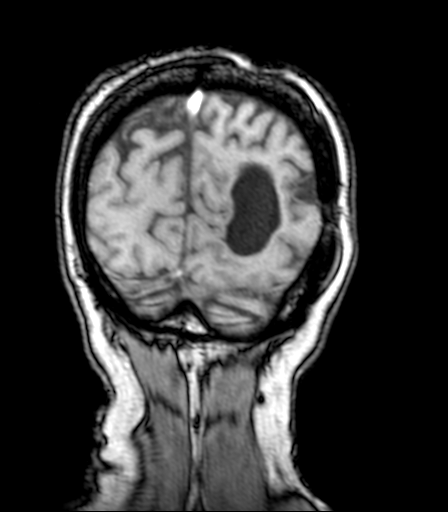
[im 118/140]
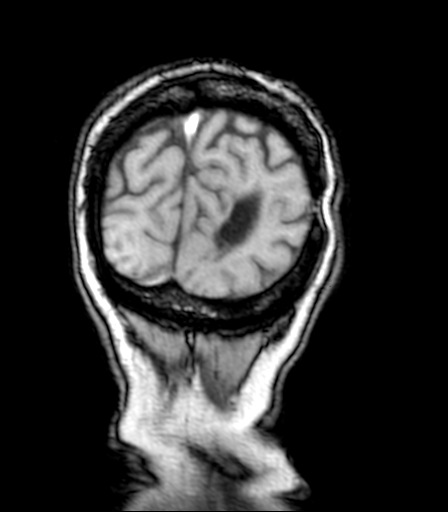
[im 134/140]
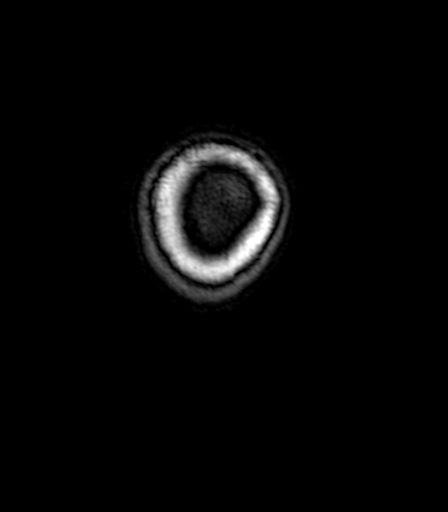

[Series 13: tof_3d cor_mip_tra · axial · 220.0mm · 0.43mm/px · 1 of 1 slices shown]
[im 1/1]
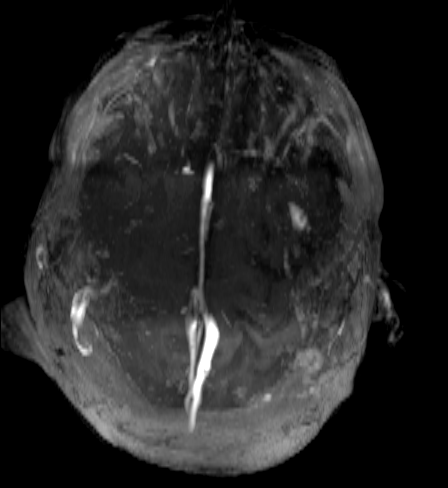

[23 of 48 positions shown; findings below may reference images not displayed]

FINDINGS: MR HEAD FINDINGS

Brain: No acute infarct, mass effect or extra-axial collection.
Hemosiderin deposition in the left insula and basal ganglia. Left
parietal encephalomalacia with ex vacuo dilatation of the left
lateral ventricle. The midline structures are normal. Right frontal
shunt catheter tract

Vascular: Large vein of the left sylvian fissure.

Skull and upper cervical spine: Remote left craniotomy.

Sinuses/Orbits:No paranasal sinus fluid levels or advanced mucosal
thickening. No mastoid or middle ear effusion. Normal orbits.

MRV HEAD FINDINGS

Superior sagittal sinus: Normal.

Straight sinus: Normal.

Inferior sagittal sinus, vein of L[place] and internal cerebral veins:
Normal.

Transverse sinuses: Normal.

Sigmoid sinuses: Normal.

Visualized jugular veins: Normal.
IMPRESSION: 1. No acute intracranial abnormality.
2. Postsurgical left parietal encephalomalacia and old blood
products in the left insula and basal ganglia.
3. Normal MRV of the brain.

## 2020-08-20 IMAGING — MR MR HEAD WO/W CM
13 series · 48 of 48 positions shown · IV contrast (11 ml multihance)
Comparison: None.

CONTRAST:  15mL MULTIHANCE GADOBENATE DIMEGLUMINE 529 MG/ML IV SOLN

CLINICAL DATA: Headaches and hearing loss

EXAM:
MRI HEAD WITH AND WITHOUT CONTRAST
MRV HEAD WITHOUT CONTRAST
TECHNIQUE: Multiplanar, multiecho pulse sequences of the brain and surrounding
structures were obtained with and without intravenous contrast.
Angiographic images of the intracranial venous structures were
obtained using MRV technique without intravenous contrast.

[Series 4: T1 · sagittal · 5.0mm · 0.45mm/px · 2 of 22 slices shown]
[im 1/22]
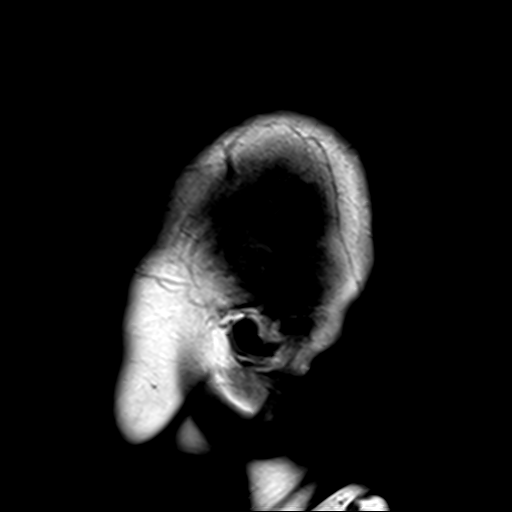
[im 22/22]
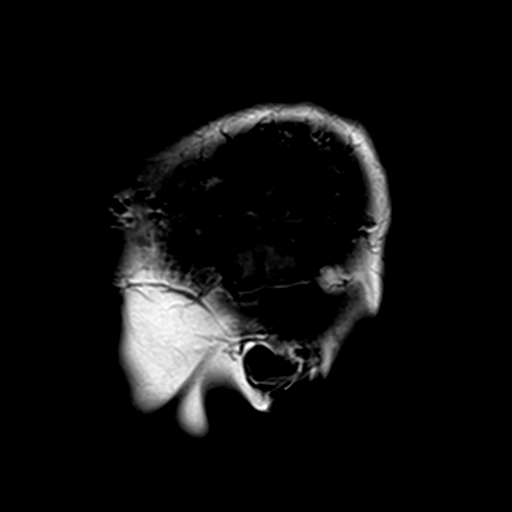

[Series 5: DWI · axial · 3.0mm · 1.80mm/px · z∈[-27,+120]mm · 7 of 100 slices shown (1 of 4)]
[im 1/100]
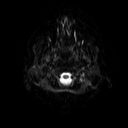
[im 17/100]
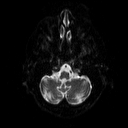
[im 34/100]
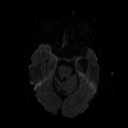
[im 50/100]
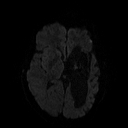
[im 67/100]
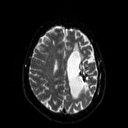
[im 83/100]
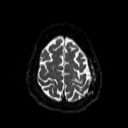
[im 100/100]
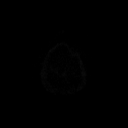

[Series 6: DWI · axial · 3.0mm · 1.80mm/px · z∈[-27,+120]mm · 3 of 48 slices shown (2 of 4)]
[im 1/48]
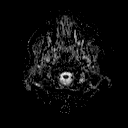
[im 24/48]
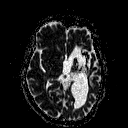
[im 48/48]
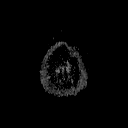

[Series 7: DWI · coronal · 5.0mm · 1.80mm/px · 5 of 72 slices shown (3 of 4)]
[im 1/72]
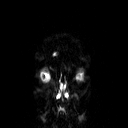
[im 18/72]
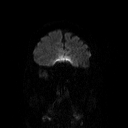
[im 36/72]
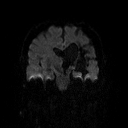
[im 54/72]
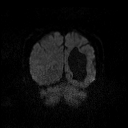
[im 72/72]
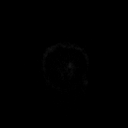

[Series 8: DWI · coronal · 5.0mm · 1.80mm/px · 2 of 36 slices shown (4 of 4)]
[im 1/36]
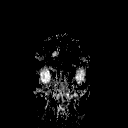
[im 36/36]
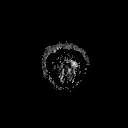

[Series 9: T2 · axial · 5.0mm · 0.90mm/px · 1 of 22 slices shown (1 of 2)]
[im 1/22]
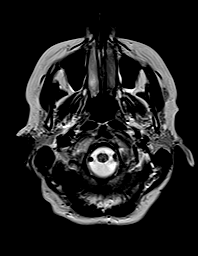

[Series 12: swi_images · axial · 4.0mm · 0.90mm/px · z∈[-24,+116]mm · 2 of 36 slices shown]
[im 1/36]
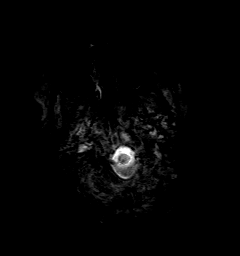
[im 36/36]
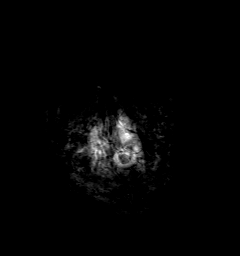

[Series 13: FLAIR · axial · 3.0mm · 0.45mm/px · z∈[-21,+114]mm · 2 of 30 slices shown]
[im 1/30]
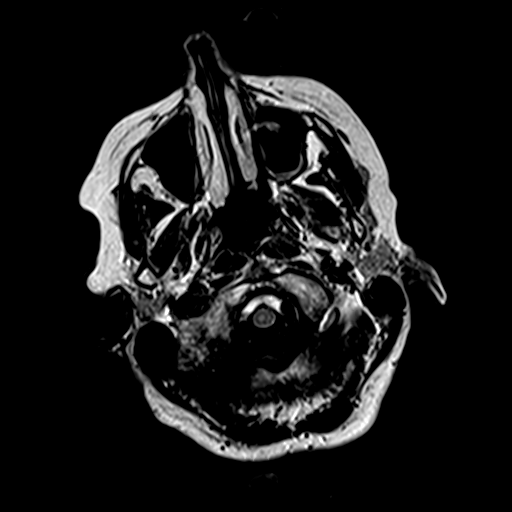
[im 30/30]
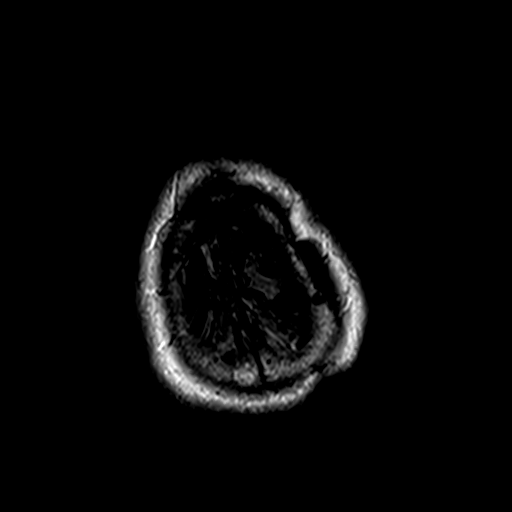

[Series 14: t1_mpr_tra · axial · 1.0mm · 0.75mm/px · z∈[-27,+116]mm · 9 of 144 slices shown]
[im 1/144]
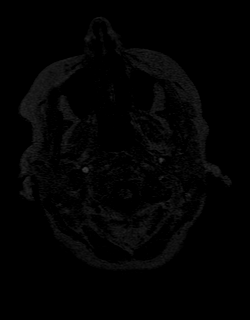
[im 18/144]
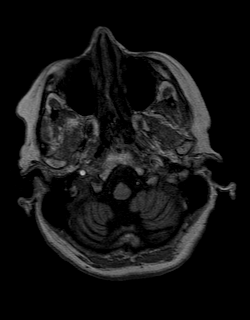
[im 36/144]
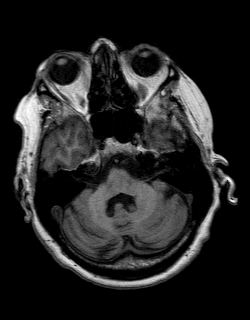
[im 54/144]
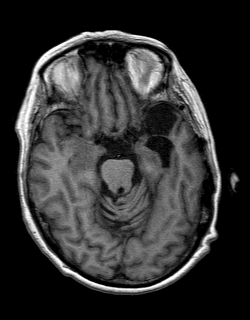
[im 72/144]
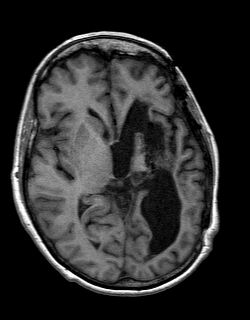
[im 90/144]
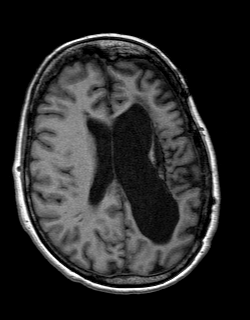
[im 108/144]
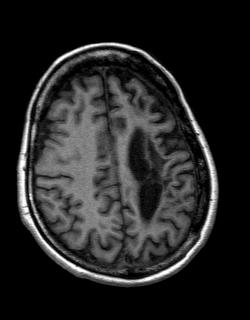
[im 126/144]
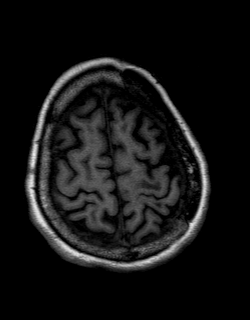
[im 144/144]
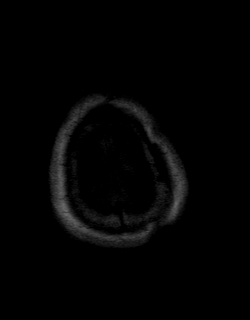

[Series 15: T2 · coronal · 5.0mm · 0.45mm/px · 2 of 28 slices shown (2 of 2)]
[im 1/28]
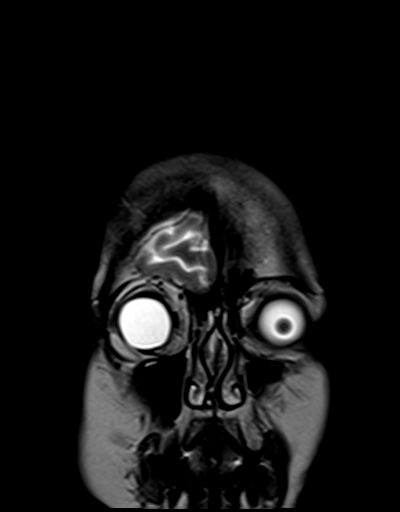
[im 28/28]
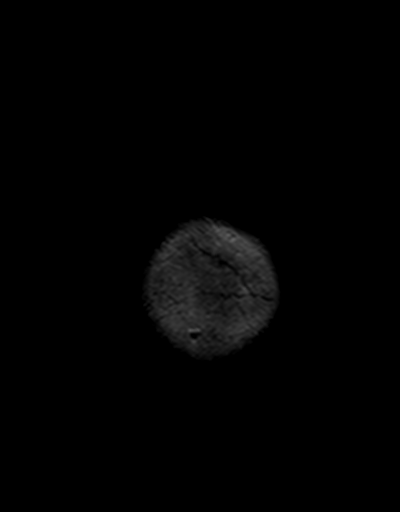

[Series 16: t1_mpr_tra post · axial · 1.0mm · 0.75mm/px · z∈[-27,+116]mm · 9 of 144 slices shown]
[im 1/144]
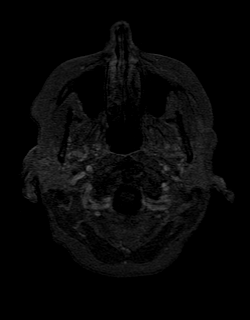
[im 18/144]
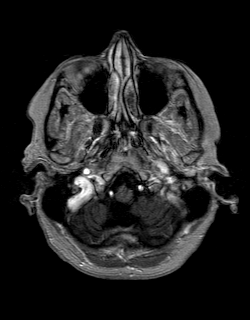
[im 36/144]
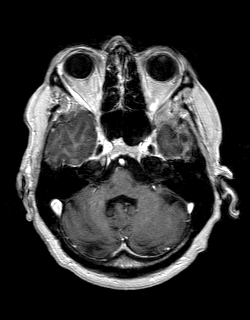
[im 54/144]
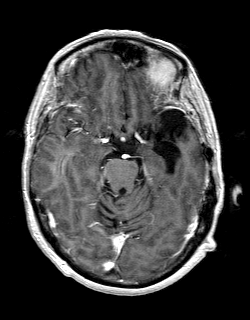
[im 72/144]
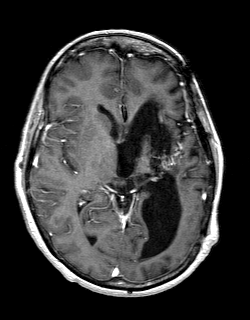
[im 90/144]
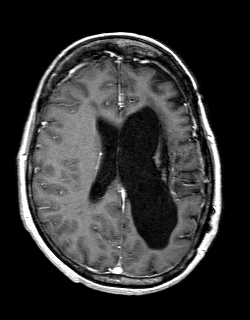
[im 108/144]
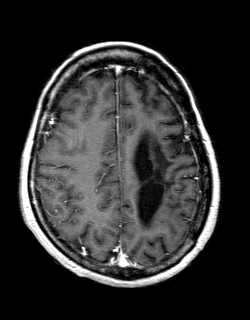
[im 126/144]
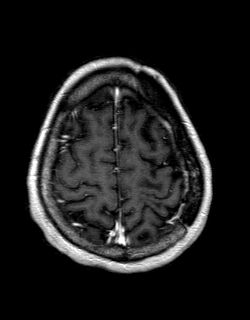
[im 144/144]
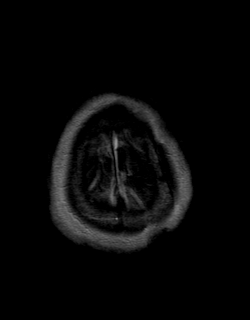

[Series 17: post cor · coronal · 5.0mm · 0.45mm/px · 2 of 28 slices shown]
[im 1/28]
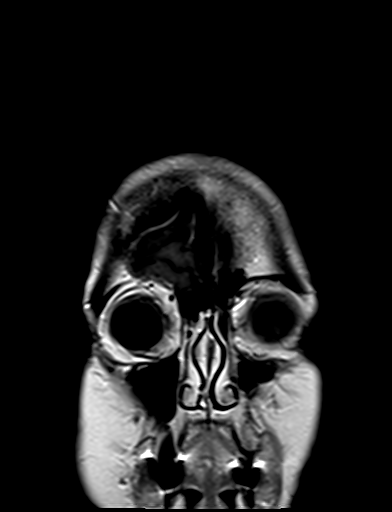
[im 28/28]
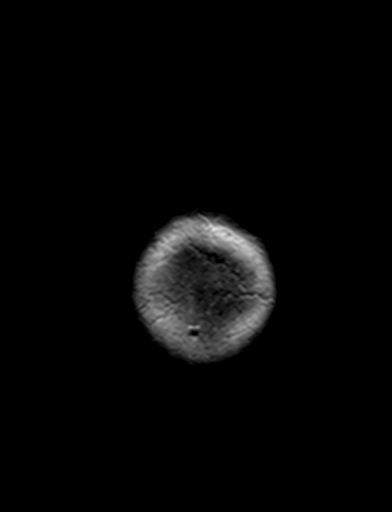

[Series 18: post sag (optional · sagittal · 5.0mm · 0.45mm/px · 2 of 24 slices shown]
[im 1/24]
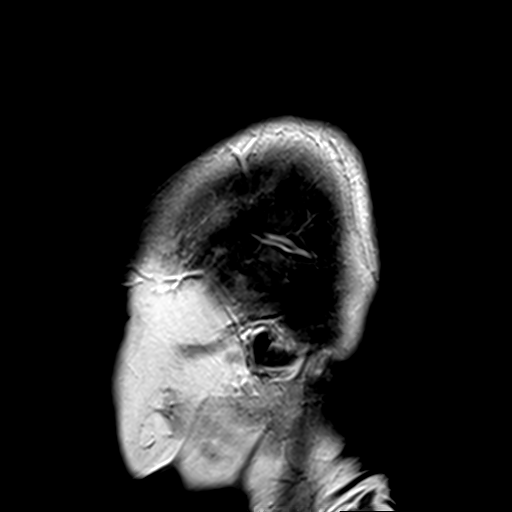
[im 24/24]
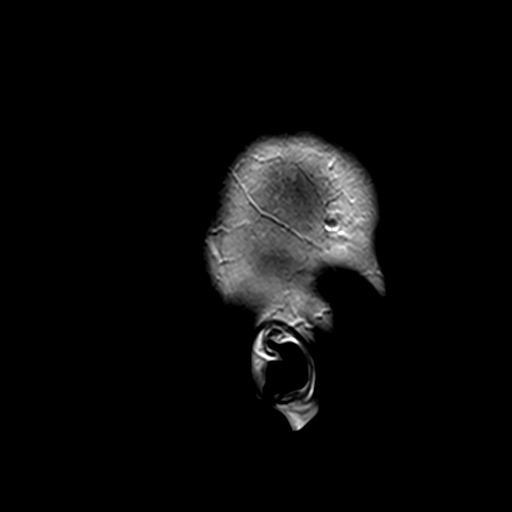

[48 of 48 positions shown; findings below may reference images not displayed]

FINDINGS: MR HEAD FINDINGS

Brain: No acute infarct, mass effect or extra-axial collection.
Hemosiderin deposition in the left insula and basal ganglia. Left
parietal encephalomalacia with ex vacuo dilatation of the left
lateral ventricle. The midline structures are normal. Right frontal
shunt catheter tract

Vascular: Large vein of the left sylvian fissure.

Skull and upper cervical spine: Remote left craniotomy.

Sinuses/Orbits:No paranasal sinus fluid levels or advanced mucosal
thickening. No mastoid or middle ear effusion. Normal orbits.

MRV HEAD FINDINGS

Superior sagittal sinus: Normal.

Straight sinus: Normal.

Inferior sagittal sinus, vein of [place] and internal cerebral veins:
Normal.

Transverse sinuses: Normal.

Sigmoid sinuses: Normal.

Visualized jugular veins: Normal.
IMPRESSION: 1. No acute intracranial abnormality.
2. Postsurgical left parietal encephalomalacia and old blood
products in the left insula and basal ganglia.
3. Normal MRV of the brain.

## 2020-08-20 MED ORDER — GADOBENATE DIMEGLUMINE 529 MG/ML IV SOLN
15.0000 mL | Freq: Once | INTRAVENOUS | Status: AC | PRN
  Administered 2020-08-20: 15 mL via INTRAVENOUS

## 2020-08-22 ENCOUNTER — Encounter (HOSPITAL_COMMUNITY): Payer: Self-pay

## 2020-08-22 ENCOUNTER — Emergency Department (HOSPITAL_COMMUNITY)
Admission: EM | Admit: 2020-08-22 | Discharge: 2020-08-22 | Disposition: A | Discharge status: Home / Self Care | Payer: Medicare Other | Attending: Emergency Medicine | Admitting: Emergency Medicine

## 2020-08-22 ENCOUNTER — Emergency Department (HOSPITAL_COMMUNITY): Payer: Medicare Other

## 2020-08-22 ENCOUNTER — Other Ambulatory Visit: Payer: Self-pay

## 2020-08-22 DIAGNOSIS — N183 Chronic kidney disease, stage 3 unspecified: Secondary | ICD-10-CM | POA: Diagnosis not present

## 2020-08-22 DIAGNOSIS — Z79899 Other long term (current) drug therapy: Secondary | ICD-10-CM | POA: Diagnosis not present

## 2020-08-22 DIAGNOSIS — M76892 Other specified enthesopathies of left lower limb, excluding foot: Secondary | ICD-10-CM | POA: Diagnosis not present

## 2020-08-22 DIAGNOSIS — M533 Sacrococcygeal disorders, not elsewhere classified: Secondary | ICD-10-CM | POA: Diagnosis not present

## 2020-08-22 DIAGNOSIS — M25551 Pain in right hip: Secondary | ICD-10-CM | POA: Diagnosis not present

## 2020-08-22 DIAGNOSIS — Y9311 Activity, swimming: Secondary | ICD-10-CM | POA: Diagnosis not present

## 2020-08-22 DIAGNOSIS — Y9234 Swimming pool (public) as the place of occurrence of the external cause: Secondary | ICD-10-CM | POA: Insufficient documentation

## 2020-08-22 DIAGNOSIS — W19XXXA Unspecified fall, initial encounter: Secondary | ICD-10-CM | POA: Diagnosis not present

## 2020-08-22 DIAGNOSIS — M160 Bilateral primary osteoarthritis of hip: Secondary | ICD-10-CM | POA: Diagnosis not present

## 2020-08-22 DIAGNOSIS — M25552 Pain in left hip: Secondary | ICD-10-CM | POA: Diagnosis not present

## 2020-08-22 DIAGNOSIS — W010XXA Fall on same level from slipping, tripping and stumbling without subsequent striking against object, initial encounter: Secondary | ICD-10-CM | POA: Diagnosis not present

## 2020-08-22 DIAGNOSIS — Z8673 Personal history of transient ischemic attack (TIA), and cerebral infarction without residual deficits: Secondary | ICD-10-CM | POA: Insufficient documentation

## 2020-08-22 DIAGNOSIS — I129 Hypertensive chronic kidney disease with stage 1 through stage 4 chronic kidney disease, or unspecified chronic kidney disease: Secondary | ICD-10-CM | POA: Diagnosis not present

## 2020-08-22 DIAGNOSIS — M76891 Other specified enthesopathies of right lower limb, excluding foot: Secondary | ICD-10-CM | POA: Diagnosis not present

## 2020-08-22 IMAGING — CR DG HIP (WITH OR WITHOUT PELVIS) 3-4V BILAT
5 series · 5 of 5 positions shown · non-contrast
Comparison: None.

CLINICAL DATA: Left greater than right hip pain after fall.

EXAM:
DG HIP (WITH OR WITHOUT PELVIS) 3-4V BILAT

[x pelvis]
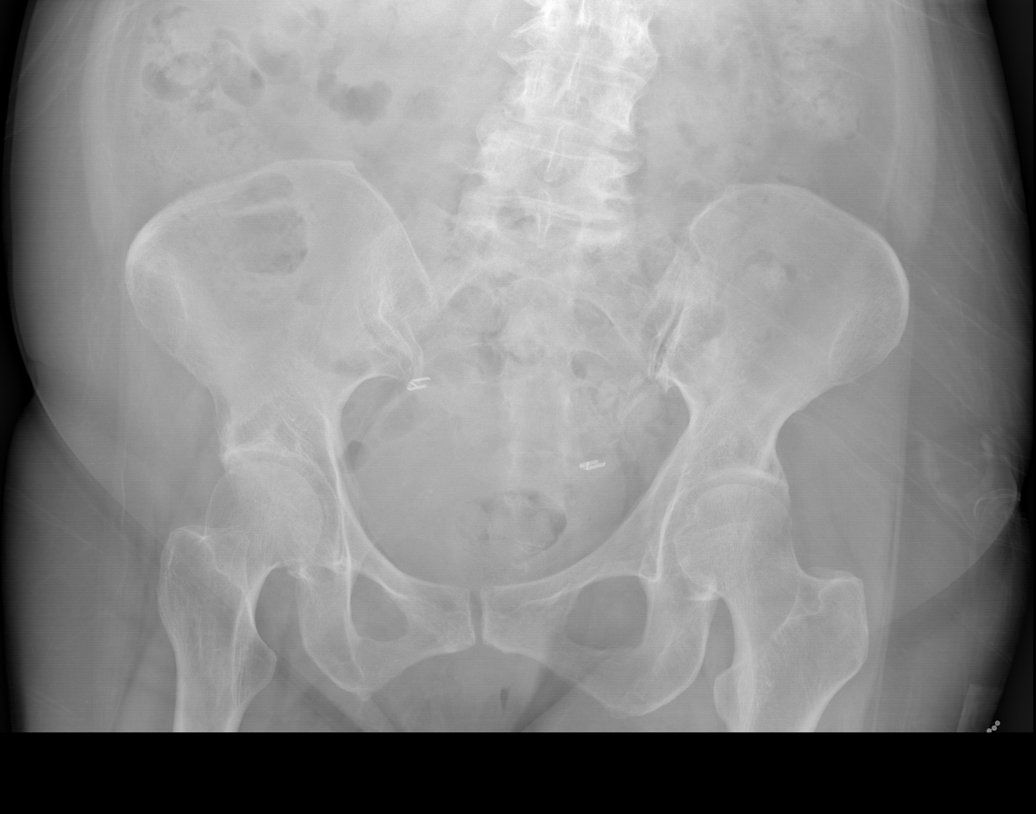

[x hip ap left]
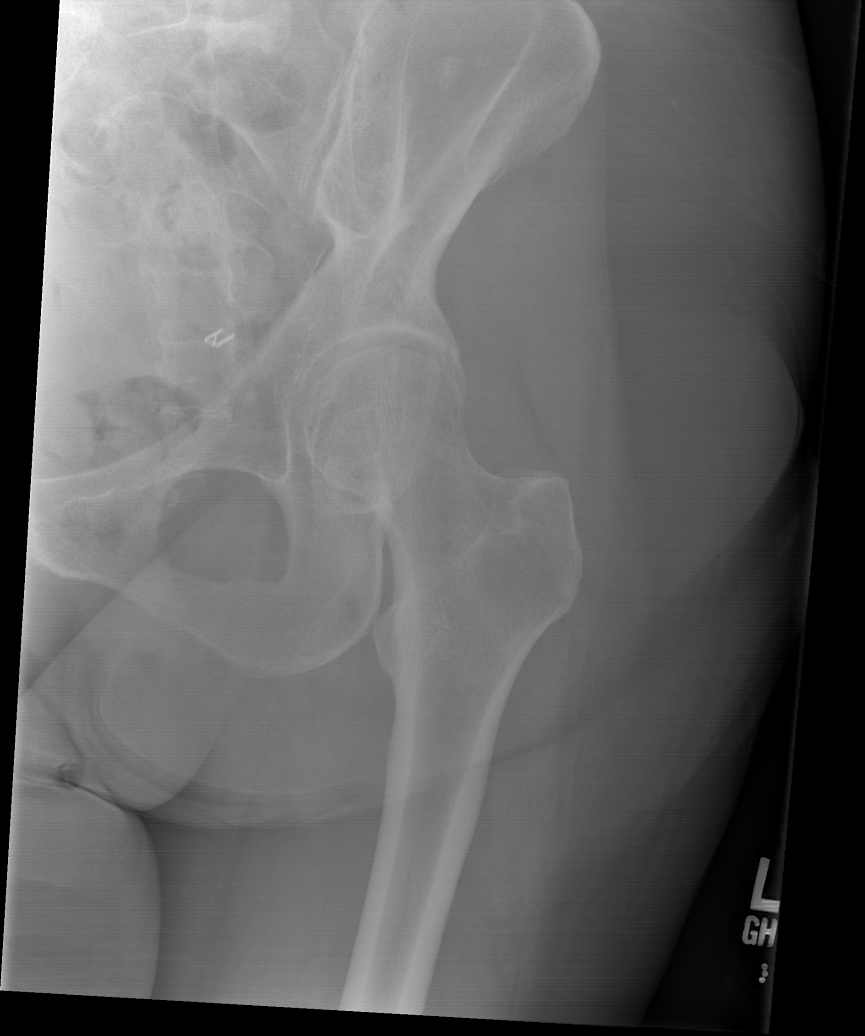

[x hip lat left]
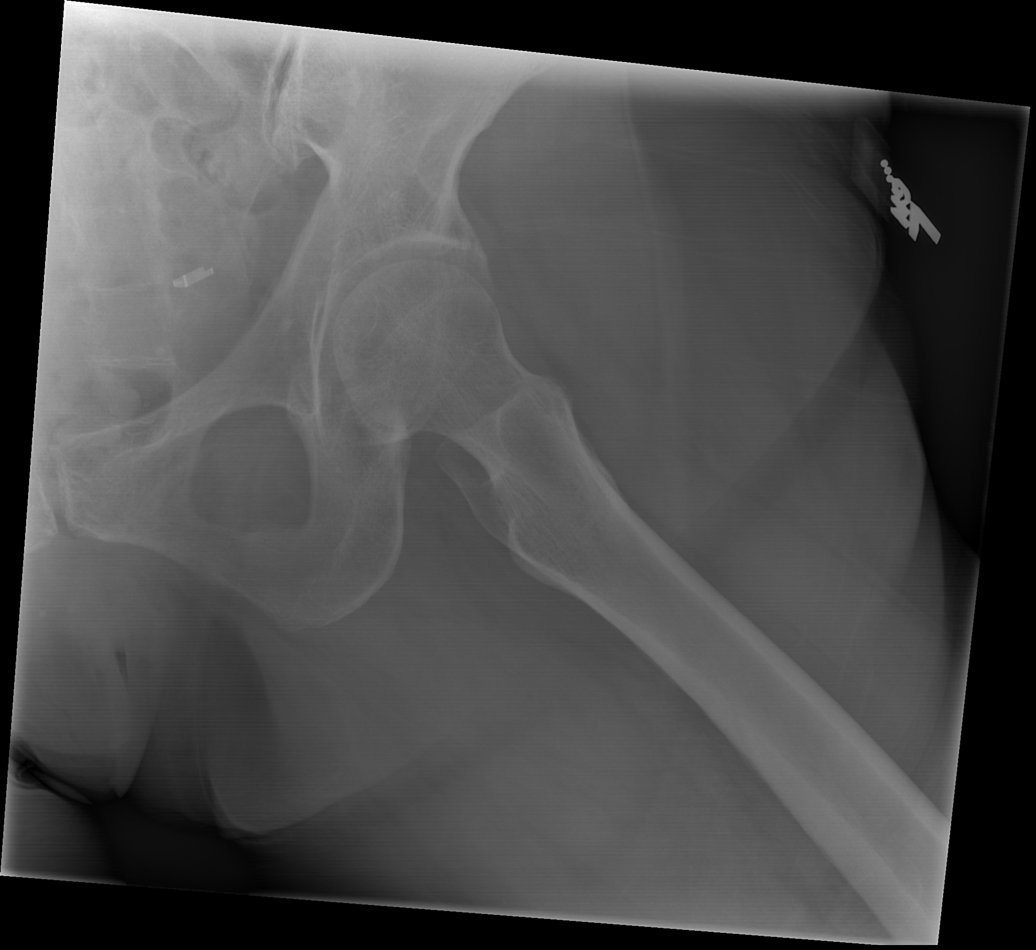

[x hip lat right]
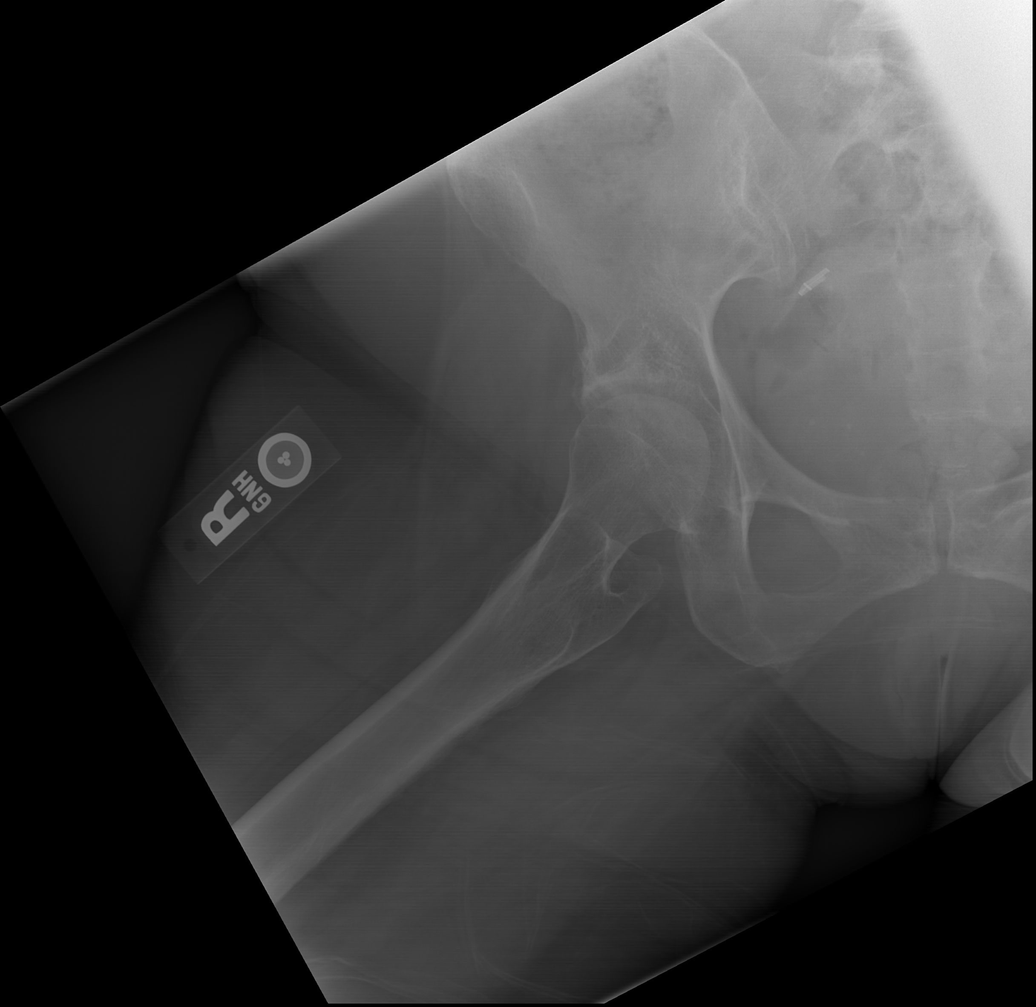

[x hip ap right]
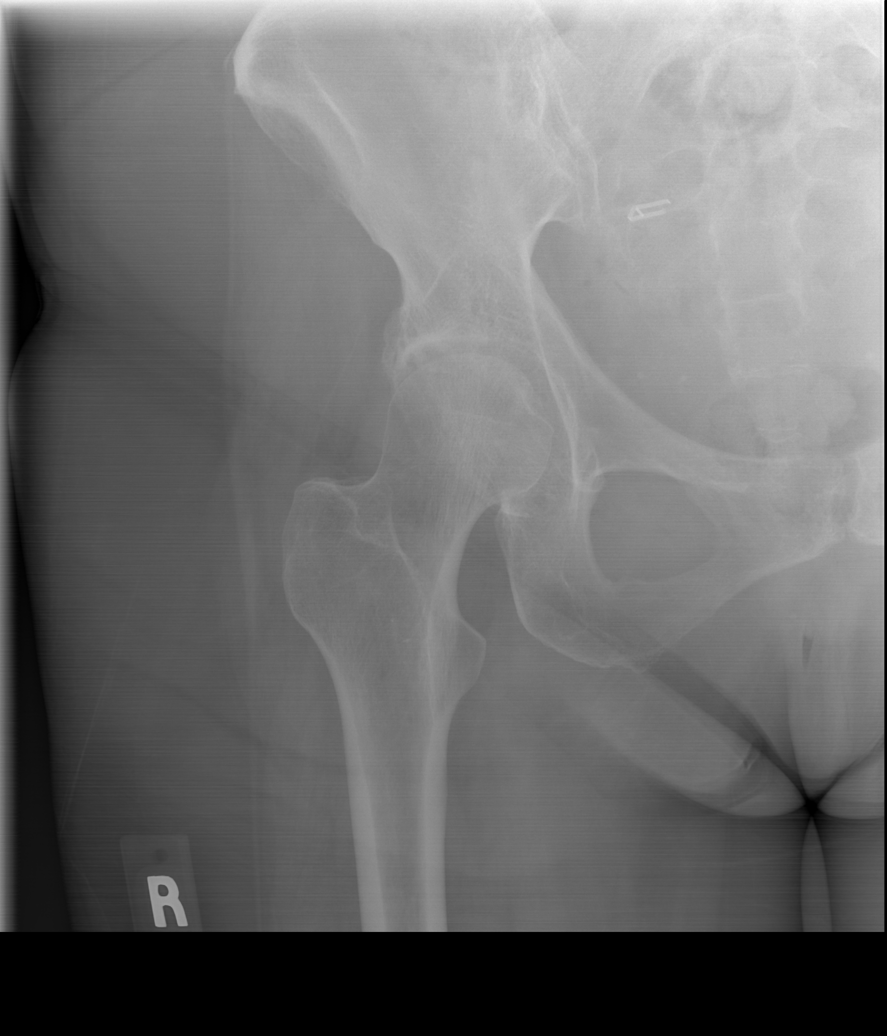

[5 of 5 positions shown; findings below may reference images not displayed]

FINDINGS: The cortical margins of the bony pelvis are intact. No fracture.
Pubic symphysis and sacroiliac joints are congruent. Both femoral
heads are well-seated in the respective acetabula. Mild degenerative
bilateral acetabular spurring, right greater than left. Pubic rami
are intact. Surgical clips in the right and left pelvis likely tubal
ligation.
IMPRESSION: 1. No fracture of the pelvis or hips.
2. Mild bilateral hip osteoarthritis.

## 2020-08-22 MED ORDER — ACETAMINOPHEN 500 MG PO TABS
1000.0000 mg | ORAL_TABLET | Freq: Once | ORAL | Status: AC
  Administered 2020-08-22: 1000 mg via ORAL
  Filled 2020-08-22: qty 2

## 2020-08-22 NOTE — ED Triage Notes (Signed)
Pt BIB from [facility]. Pt was swimming and fell while drying off. Pt denies hitting head, LOC, and blood thinners. Pt endorses pain to left hip.

## 2020-08-22 NOTE — ED Provider Notes (Signed)
[facility] DEPT Provider Note   CSN: [id number] Arrival date & time: 08/22/20  1600     History Chief Complaint  Patient presents with  . Hip Pain    Tonya Thompson is a 65 y.o. female.  HPI  Patient is a 65 year old female with past medical history significant for AV malformations, stroke, seizures, HLD AKI  Patient brought in from [facility] where she was swimming in the pool and had a mechanical fall where she states she slipped slightly on the wet floor and fell down onto her buttocks.  She states that she feels that she fell slightly onto her left hip as well.  She states she has bilateral hip pain.  She denies any head injury or loss of consciousness no nausea or vomiting.  She is not on a blood thinner she denies any lightheadedness, dizziness, headache, chest pain, abdominal pain, shortness of breath or any other symptoms prior to her fall.  She states with a mechanical fall.  She states her pain in her left hip is 2/10.  She states it is aching and constant.  She states that she does have residual weakness on the right side from several strokes that she has had.  Denies any other associated symptoms.    Past Medical History:  Diagnosis Date  . Acute kidney injury ([place]) 09/03/2019  . Allergy    seasonal allergies  . Arthritis    LEFT hand  . AVM (arteriovenous malformation) brain   . Cataract    bilateral-not a surgical candidate at this time (07/23/2020)  . GERD (gastroesophageal reflux disease)    on meds  . Hyperlipidemia    diet controlled  . Insulin resistance 02/16/2018  . Seizures ([place])    Grand Mal & Partial complex seizure disorder-last 1998  . Stroke [facility])    5 total so far (age [id number])  . Ureteral stone 05/10/2019    Patient Active Problem List   Diagnosis Date Noted  . History of colon polyps 06/26/2020  . History of renal stone 09/04/2019  . Medication management 02/16/2018  . Osteopenia 01/10/2018  .  CKD (chronic kidney disease) stage 3, GFR 30-59 ml/min (HCC) 11/10/2017  . 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 Hyperlipidemia, mixed 06/01/2017  . Abnormal glucose 06/01/2017  . Vitamin D deficiency 06/01/2017  . 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 Headache, unspecified headache type 04/14/2016  . Right hemiparesis ([place]) 12/29/2015    Past Surgical History:  Procedure Laterality Date  . BRAIN SURGERY  09/19/1978   at [facility], Dr. Leida Lauth  . COLONOSCOPY  2017   [place], [place]  . CYSTOSCOPY/URETEROSCOPY/HOLMIUM LASER/STENT PLACEMENT Left 05/10/2019   Procedure: CYSTOSCOPY/RETROGRADE/URETEROSCOPY/HOLMIUM LASER/STENT PLACEMENT;  Surgeon: Raynelle Bring, MD;  Location: [facility] ORS;  Service: Urology;  Laterality: Left;  . FOOT SURGERY Right 10/1985   ankle fusion, at [facility], Dr. Lorelee Cover  . POLYPECTOMY  2017   multiple adenomas  . TUBAL LIGATION  1981  . WISDOM TOOTH EXTRACTION       OB History    Gravida  0   Para  0   Term  0   Preterm  0   AB  0   Living  0     SAB  0   IAB  0   Ectopic  0   Multiple  0   Live Births  0  Family History  Problem Relation Age of Onset  . Heart disease Mother   . Heart failure Mother   . Heart attack Mother   . Diabetes Father   . Hyperlipidemia Father   . Dementia Father   . Prostate cancer Father   . Melanoma Father   . Colon polyps Father   . Hyperlipidemia Paternal Uncle   . Hypertension Paternal Uncle   . Dementia Paternal Uncle   . Heart attack Maternal Grandfather   . Stroke Paternal Grandmother   . CAD Maternal Uncle   . COPD Maternal Uncle        smoker  . Breast cancer Neg Hx   . Colon cancer Neg Hx   . Esophageal cancer Neg Hx   . Stomach cancer Neg Hx   . Rectal cancer Neg Hx     Social History   Tobacco Use  . Smoking status: Never Smoker  . Smokeless tobacco: Never Used  Vaping Use  . Vaping Use: Never used   Substance Use Topics  . Alcohol use: No  . Drug use: No    Home Medications Prior to Admission medications   Medication Sig Start Date End Date Taking? Authorizing Provider  baclofen (LIORESAL) 10 MG tablet Take 10mg  three times a day as needed. 08/06/20   Garnet Sierras, NP  butalbital-acetaminophen-caffeine (FIORICET) 50-325-40 MG tablet Take 1 tablet as needed for severe headache. Do not take more than 2-3 a week. 08/11/20   Cameron Sprang, MD  calcium carbonate (TUMS - DOSED IN MG ELEMENTAL CALCIUM) 500 MG chewable tablet Chew 1 tablet by mouth daily.    [provider]  Calcium Citrate-Vitamin D (CALCIUM + D PO) Take 1 tablet by mouth daily.     [provider]  divalproex (DEPAKOTE ER) 500 MG 24 hr tablet Take 1 tab in AM, 1 and 1/2 tabs in PM 08/11/20   Cameron Sprang, MD  fexofenadine (ALLEGRA) 180 MG tablet Take 180 mg by mouth daily.    [provider]  Multiple Vitamins-Minerals (MULTIVITAMIN WITH MINERALS) tablet Take 1 tablet by mouth daily.     [provider]  Omega-3 Fatty Acids (FISH OIL PO) Take 1 capsule by mouth daily.    [provider]  POTASSIUM PO Take 1 tablet by mouth daily.    [provider]  predniSONE (DELTASONE) 20 MG tablet Take 3 tabs on day 1, 2 and 1/2 tabs on day 2, 2 tabs on day 3, 1 and 1/2 tabs on day 4, 1 tab on day 5, 1/2 tab on days 6 and 7, then stop. 07/30/20   Cameron Sprang, MD  Propylene Glycol (SYSTANE BALANCE) 0.6 % SOLN Place 1 drop into both eyes 2 (two) times daily.    [provider]  topiramate (TOPAMAX) 200 MG tablet Takes 1 &1/2 tabs 2 x /day    (3 tabs /day) 08/06/20   Garnet Sierras, NP  Vitamin D3 (VITAMIN D) 25 MCG tablet Take 1,000 Units by mouth daily.     [provider]    Allergies    Aspirin, Cheese, Chocolate, Coffee flavor, Other, and Red wine complex [germanium]  Review of Systems   Review of Systems  Constitutional: Negative for fever.  HENT:  Negative for congestion.   Respiratory: Negative for shortness of breath.   Cardiovascular: Negative for chest pain.  Gastrointestinal: Negative for abdominal distention.  Musculoskeletal:       Fall, right and left hip pain  Neurological:  Negative for dizziness and headaches.    Physical Exam Updated Vital Signs BP 121/74   Pulse 78   Temp 97.8 F (36.6 C)   Resp 18   SpO2 100%   Physical Exam Vitals and nursing note reviewed.  Constitutional:      General: She is not in acute distress.    Appearance: Normal appearance. She is not ill-appearing.  HENT:     Head: Normocephalic and atraumatic.     Mouth/Throat:     Mouth: Mucous membranes are moist.  Eyes:     General: No scleral icterus.       Right eye: No discharge.        Left eye: No discharge.     Conjunctiva/sclera: Conjunctivae normal.  Pulmonary:     Effort: Pulmonary effort is normal.     Breath sounds: No stridor.  Abdominal:     Tenderness: There is no abdominal tenderness. There is no right CVA tenderness, left CVA tenderness, guarding or rebound.  Musculoskeletal:     Comments: Very mild tenderness to palpation of the left hip.  No significant right hip tenderness to palpation.  No other bony tenderness over joints or long bones of the upper and lower extremities.     No neck or back midline tenderness, step-off, deformity, or bruising. Able to turn head left and right 45 degrees without difficulty.  Full range of motion of upper and lower extremity joints shown after palpation was conducted; with 5/5 symmetrical strength in upper and lower extremities. No chest wall tenderness, no facial or cranial tenderness.   Patient has intact sensation grossly in lower and upper extremities. Intact patellar and ankle reflexes. Patient able to ambulate without difficulty.  Radial and DP pulses palpated BL.   Skin:    General: Skin is warm and dry.     Capillary Refill: Capillary refill takes less than 2 seconds.   Neurological:     Mental Status: She is alert and oriented to person, place, and time. Mental status is at baseline.     ED Results / Procedures / Treatments   Labs (all labs ordered are listed, but only abnormal results are displayed) Labs Reviewed - No data to display  EKG None  Radiology No results found.  Procedures Procedures   Medications Ordered in ED Medications  acetaminophen (TYLENOL) tablet 1,000 mg (has no administration in time range)    ED Course  I have reviewed the triage vital signs and the nursing notes.  Pertinent labs & imaging results that were available during my care of the patient were reviewed by me and considered in my medical decision making (see chart for details).  Patient is a 65 year old female with past medical history detailed in HPI presented today with bilateral hip pain after fall.  Patient is very well-appearing on examination however given her age and her concern for hip fracture will obtain x-rays.  Clinical Course as of 08/22/20 1717  Fri Aug 22, 2020  1712 BL hip xray negative for fracture or dislocation.  IMPRESSION: 1. No fracture of the pelvis or hips. 2. Mild bilateral hip osteoarthritis.   [WF]    Clinical Course User Index [WF] Tedd Sias, [place]   MDM Rules/Calculators/A&P                         X-rays were negative for fracture.  Full range of motion of the hips on my reexamination.  She had no pain  with this.  Was able to ambulate short distance with her cane/walker.  She seems to be at her baseline.  No indication for CT imaging.  Will discharge home at this time.  Final Clinical Impression(s) / ED Diagnoses Final diagnoses:  Right hip pain  Left hip pain    Rx / DC Orders ED Discharge Orders    None       Tedd Sias, [place] 08/22/20 1731    Valarie Merino, MD 08/22/20 2255

## 2020-08-22 NOTE — Discharge Instructions (Addendum)
Your hips are without any fractures.  Given that you are able to walk with your walker and move your hip through the entire range of motion without any pain I think it is reasonable for you to take Tylenol 1000 mg every 6 hours as needed for pain.  Please drink plenty of water you may use warm compresses such as heating pads to your hips.  Please do gentle stretching and follow-up with your primary care provider.

## 2020-08-27 ENCOUNTER — Ambulatory Visit
Admission: RE | Admit: 2020-08-27 | Discharge: 2020-08-27 | Disposition: A | Discharge status: Home / Self Care | Payer: Medicare Other | Source: Ambulatory Visit | Attending: Internal Medicine | Admitting: Internal Medicine

## 2020-08-27 ENCOUNTER — Other Ambulatory Visit: Payer: Self-pay

## 2020-08-27 DIAGNOSIS — Z1231 Encounter for screening mammogram for malignant neoplasm of breast: Secondary | ICD-10-CM | POA: Diagnosis not present

## 2020-08-27 IMAGING — MG MM DIGITAL SCREENING BILAT W/ TOMO AND CAD
8 series · 8 of 24 positions shown · non-contrast
Comparison: Previous exam(s).

CLINICAL DATA: Screening.

EXAM:
DIGITAL SCREENING BILATERAL MAMMOGRAM WITH TOMOSYNTHESIS AND CAD
TECHNIQUE: Bilateral screening digital craniocaudal and mediolateral oblique
mammograms were obtained. Bilateral screening digital breast
tomosynthesis was performed. The images were evaluated with
computer-aided detection.

[L MLO synth-2D]
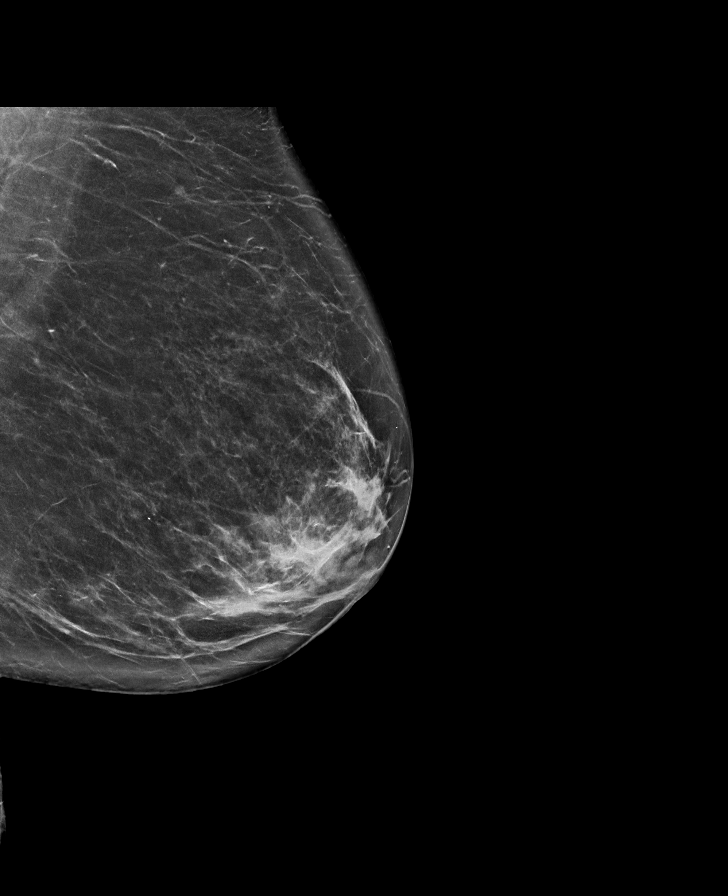

[R CC synth-2D]
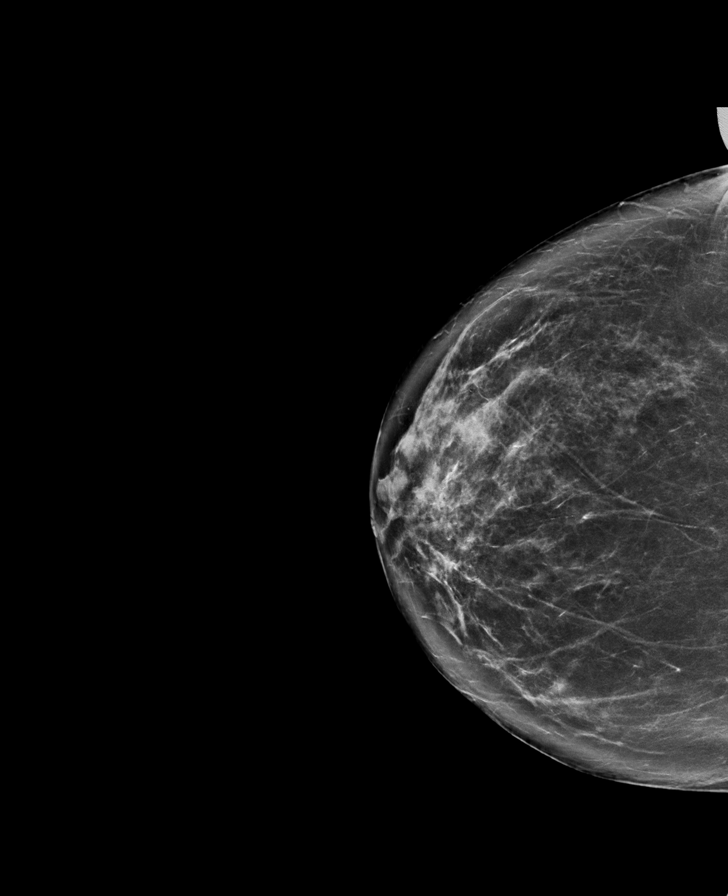

[R MLO synth-2D]
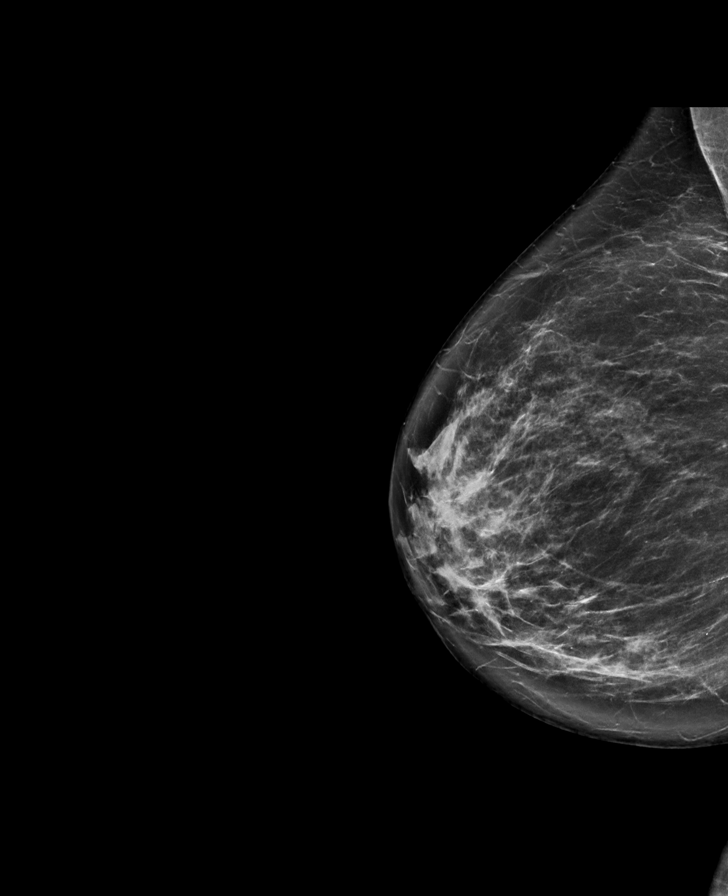

[L CC synth-2D]
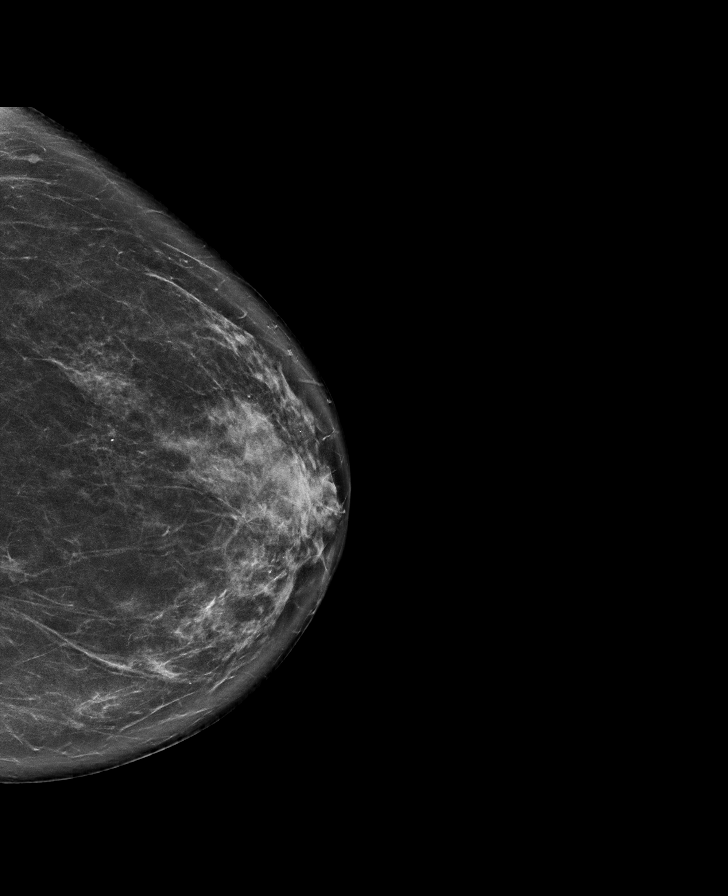

[R CC tomo · tomo slice 40/79.0]
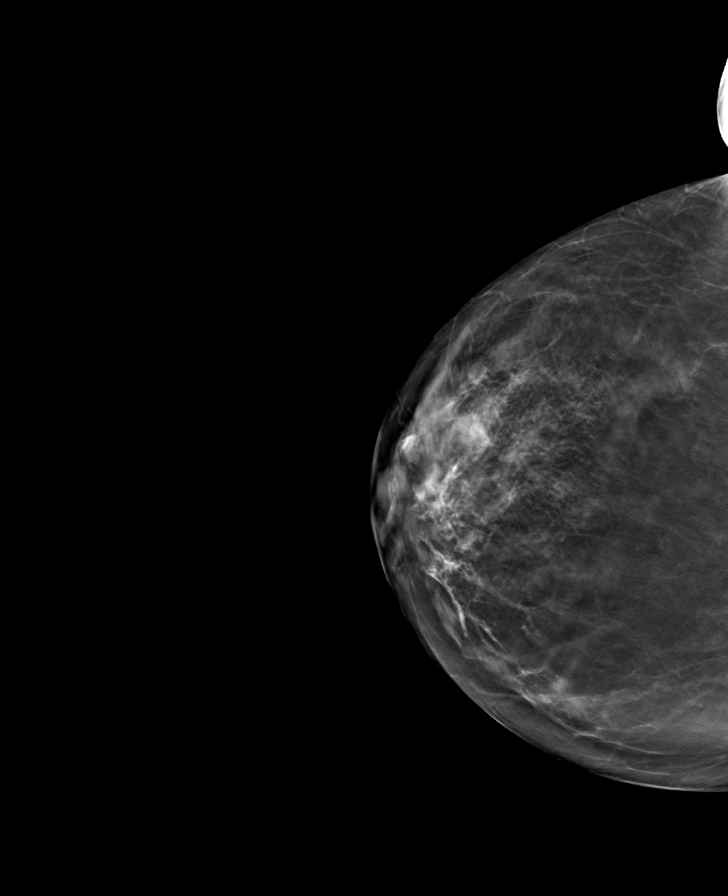

[L MLO tomo · tomo slice 43/84.0]
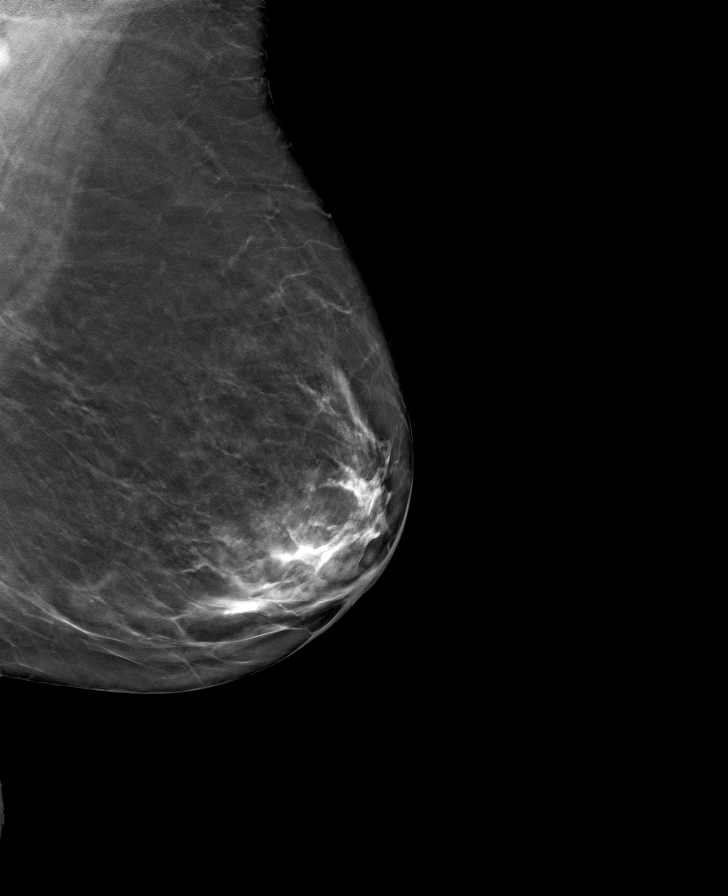

[L CC tomo · tomo slice 40/79.0]
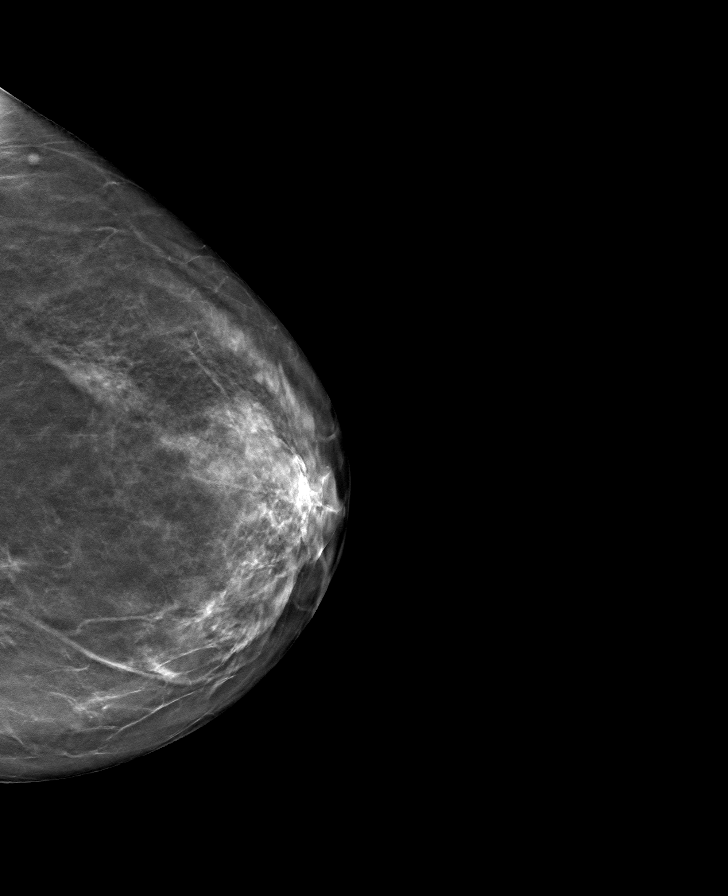

[R MLO tomo · tomo slice 41/81.0]
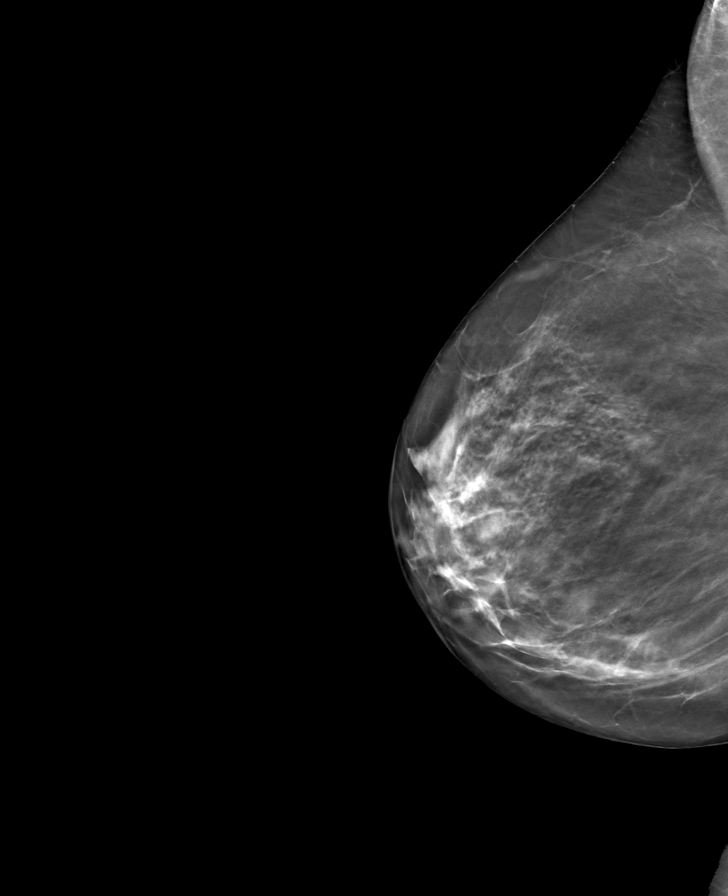

[8 of 24 positions shown; findings below may reference images not displayed]

ACR Breast Density Category c: The breast tissue is heterogeneously
dense, which may obscure small masses.
FINDINGS: There are no findings suspicious for malignancy. The images were
evaluated with computer-aided detection.
IMPRESSION: No mammographic evidence of malignancy. A result letter of this
screening mammogram will be mailed directly to the patient.

RECOMMENDATION:
Screening mammogram in one year. (Code:[6J])

BI-RADS CATEGORY  1: Negative.

## 2020-08-31 DIAGNOSIS — U071 COVID-19: Secondary | ICD-10-CM

## 2020-08-31 HISTORY — DX: COVID-19: U07.1

## 2020-09-01 ENCOUNTER — Ambulatory Visit (INDEPENDENT_AMBULATORY_CARE_PROVIDER_SITE_OTHER): Payer: Medicare Other | Admitting: Internal Medicine

## 2020-09-01 ENCOUNTER — Other Ambulatory Visit: Payer: Self-pay

## 2020-09-01 ENCOUNTER — Ambulatory Visit (INDEPENDENT_AMBULATORY_CARE_PROVIDER_SITE_OTHER): Payer: Medicare Other | Admitting: Neurology

## 2020-09-01 ENCOUNTER — Encounter: Payer: Self-pay | Admitting: Neurology

## 2020-09-01 ENCOUNTER — Encounter: Payer: Self-pay | Admitting: Internal Medicine

## 2020-09-01 VITALS — BP 119/77 | HR 74 | Ht 65.5 in | Wt 153.2 lb

## 2020-09-01 VITALS — BP 110/68 | HR 68 | Temp 97.7°F | Resp 16 | Ht 66.0 in | Wt 152.2 lb

## 2020-09-01 DIAGNOSIS — I10 Essential (primary) hypertension: Secondary | ICD-10-CM

## 2020-09-01 DIAGNOSIS — G43009 Migraine without aura, not intractable, without status migrainosus: Secondary | ICD-10-CM | POA: Diagnosis not present

## 2020-09-01 DIAGNOSIS — S300XXD Contusion of lower back and pelvis, subsequent encounter: Secondary | ICD-10-CM

## 2020-09-01 DIAGNOSIS — G40209 Localization-related (focal) (partial) symptomatic epilepsy and epileptic syndromes with complex partial seizures, not intractable, without status epilepticus: Secondary | ICD-10-CM | POA: Diagnosis not present

## 2020-09-01 MED ORDER — DIVALPROEX SODIUM ER 500 MG PO TB24: ORAL_TABLET | ORAL | 11 refills | Status: DC

## 2020-09-01 MED ORDER — TOPIRAMATE 200 MG PO TABS: ORAL_TABLET | ORAL | 11 refills | Status: DC

## 2020-09-01 MED ORDER — BACLOFEN 10 MG PO TABS: ORAL_TABLET | ORAL | 11 refills | Status: DC

## 2020-09-01 NOTE — Progress Notes (Signed)
NEUROLOGY FOLLOW UP OFFICE NOTE  Tonya Thompson [id number] 1956/02/12  HISTORY OF PRESENT ILLNESS: I had the pleasure of seeing Tonya Thompson in follow-up in the neurology clinic on 09/01/2020.  The patient was last seen a month ago for seizures (well-controlled) and status migrainosus. She was in significant pain on last visit and was given another migraine cocktail and and steroid course, and started on Depakote for headache prophylaxis. MRI/MRV brain in 08/2020 did not show any acute changes, there was postsurgical left parietal encephalomalacia and old blood products in the left insula and basal ganglia, normal MRV. She called our office 2 weeks ago to report she was feeling great with headaches almost gone completely away, sleeping better. Today she reports that she is doing a lot better, the severe headaches have resolved, however she still has a daily 1/10 intensity headache that comes and goes that she is taking Tylenol ES twice a day. She has a little bit of headache before going to sleep and would take one. Pain is in the frontal or occipital region, no nausea/vomiting, vision changes. She is taking Depakote ER 500mg  qhs in addition to Topiramate 200mg  1.5 tab BID without side effects. Sleep is better, she only gets up once to urinate then goes back to sleep. No dizziness, no falls. No seizures since 1998. She takes Baclofen 10mg  usually qhs to help with right leg spasticity.    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   1. Seizures. She had a hemorrhagic stroke at age 89, then had subsequent strokes after, always affecting the right side, last was in 1984. She has been on Topiramate 300mg  BID for many years, switched to generic in 1999. Her last seizure was in  1998. She was last seen by Dr. Adolphus Birchwood at the [facility] in 06/2018. Per notes, she has abnormal EEGs with left hemisphere slowing, maximal left temporal central area with epileptiform activity. She denies any side effects on Topiramate. She denies any staring/unresponsive episodes, gaps in time, olfactory/gustatory hallucinations, myoclonic jerks. She had a normal birth and early development.  There is no history of febrile convulsions, CNS infections such as meningitis/encephalitis, or family history of seizures.  2. Status migrainosus. She has a history of migraines with prn Fioricet for rescue. She reports being migraine-free since 2017, however at that time she was also in status migrainosus with migraines that lasted 8 days. She recalls taking a 1-week prednisone course with Dr. Marlou Sa with resolution of migraines. This headache started 2 weeks ago, she reports 10/10 throbbing pain in the frontal regions, with tenderness to palpation. She has been nauseated, no vomiting. No visual obscurations. She is sensitive to lights. She was prescribed Prednisone 20mg  2 tab daily for 3 days, then 1 tab daily for 4 days. She is saying she was only taking 1 tablet daily and took it for only 3-4 days because it was not working. Headaches progressively worsened that she has been to the ER 3 times in the past week. In the ER, she has been given IV Reglan/Benadryl, IV Fentanyl, PO Decadron through her PCP, Vicodin from the ER, and most recently IV Depacon 1000mg  and magnesium 2 days ago. She had a sample of Nurtec yesterday from PCP office which only made her sleep 2 hours but did  not help with headache. She had a CTA head and neck on 07/24/20 which did not show any acute changes. There was a chronic left MCA infarct with small areas of calcific density within the infarct. There was a cluster of vessels in the left sylvian fissure compatible with residual AVM, appears to drain into the vein of [place]. ESR was  normal (11). She states she stopped the Dexamethasone for a few days but took it again this morning. She states that she had done well with migraines by staying away from food triggers, she thinks there may have been something in her food that set this off. She manages her medications and denies missing doses. She has some soreness in her neck. She takes Baclofen for leg spasticity. She used to get 8 hours of sleep, but since headaches recurred, she only gets 5 hours.    PAST MEDICAL HISTORY: Past Medical History:  Diagnosis Date  . Acute kidney injury ([place]) 09/03/2019  . Allergy    seasonal allergies  . Arthritis    LEFT hand  . AVM (arteriovenous malformation) brain   . Cataract    bilateral-not a surgical candidate at this time (07/23/2020)  . GERD (gastroesophageal reflux disease)    on meds  . Hyperlipidemia    diet controlled  . Insulin resistance 02/16/2018  . Seizures ([place])    Grand Mal & Partial complex seizure disorder-last 1998  . Stroke [facility])    5 total so far (age 65  . Ureteral stone 05/10/2019    MEDICATIONS: Current Outpatient Medications on File Prior to Visit  Medication Sig Dispense Refill  . baclofen (LIORESAL) 10 MG tablet Take $RemoveBef'10mg'McfuGzlMPm$  three times a day as needed. 90 each 0  . butalbital-acetaminophen-caffeine (FIORICET) 50-325-40 MG tablet Take 1 tablet as needed for severe headache. Do not take more than 2-3 a week. 10 tablet 5  . calcium carbonate (TUMS - DOSED IN MG ELEMENTAL CALCIUM) 500 MG chewable tablet Chew 1 tablet by mouth daily.    . Calcium Citrate-Vitamin D (CALCIUM + D PO) Take 1 tablet by mouth daily.     . fexofenadine (ALLEGRA) 180 MG tablet Take 180 mg by mouth daily.    . Multiple Vitamins-Minerals (MULTIVITAMIN WITH MINERALS) tablet Take 1 tablet by mouth daily.     . Omega-3 Fatty Acids (FISH OIL PO) Take 1 capsule by mouth daily.    Marland Kitchen POTASSIUM PO Take 1 tablet by mouth daily.    Marland Kitchen Propylene Glycol (SYSTANE BALANCE) 0.6 % SOLN Place  1 drop into both eyes 2 (two) times daily.    Marland Kitchen topiramate (TOPAMAX) 200 MG tablet Takes 1 &1/2 tabs 2 x /day    (3 tabs /day) 270 tablet 1  . Vitamin D3 (VITAMIN D) 25 MCG tablet Take 1,000 Units by mouth daily.      No current facility-administered medications on file prior to visit.    ALLERGIES: Allergies  Allergen Reactions  . Aspirin Other (See Comments)    CONTRAINDICATED DUE TO HISTORY OF BLEEDING IN BRAIN  . Cheese     Cant take due to history of severe migraines  . Chocolate     Cant take due to history of severe migraines  . Coffee Flavor     Cant take due to history of severe migraines  . Other      Nuts - Cant take due to history of severe migraines  . Red Wine Complex [Germanium]     Cant take due to history  of severe migraines    FAMILY HISTORY: Family History  Problem Relation Age of Onset  . Heart disease Mother   . Heart failure Mother   . Heart attack Mother   . Diabetes Father   . Hyperlipidemia Father   . Dementia Father   . Prostate cancer Father   . Melanoma Father   . Colon polyps Father   . Hyperlipidemia Paternal Uncle   . Hypertension Paternal Uncle   . Dementia Paternal Uncle   . Heart attack Maternal Grandfather   . Stroke Paternal Grandmother   . CAD Maternal Uncle   . COPD Maternal Uncle        smoker  . Breast cancer Neg Hx   . Colon cancer Neg Hx   . Esophageal cancer Neg Hx   . Stomach cancer Neg Hx   . Rectal cancer Neg Hx     SOCIAL HISTORY: Social History   Socioeconomic History  . Marital status: Single    Spouse name: Not on file  . Number of children: Not on file  . Years of education: Not on file  . Highest education level: Not on file  Occupational History  . Not on file  Tobacco Use  . Smoking status: Never Smoker  . Smokeless tobacco: Never Used  Vaping Use  . Vaping Use: Never used  Substance and Sexual Activity  . Alcohol use: No  . Drug use: No  . Sexual activity: Not Currently    Birth  control/protection: None    Comment: 1st intercourse 65 yo-Fewer than 5 partners  Other Topics Concern  . Not on file  Social History Narrative   Right handed until 3rd grade    Left handed   Lives a friends home [address] independent living    Social Determinants of Health   Financial Resource Strain: Not on file  Food Insecurity: Not on file  Transportation Needs: Not on file  Physical Activity: Not on file  Stress: Not on file  Social Connections: Not on file  Intimate Partner Violence: Not on file     PHYSICAL EXAM: Vitals:   09/01/20 1245  BP: 119/77  Pulse: 74  SpO2: 97%   General: No acute distress Head:  Normocephalic/atraumatic Skin/Extremities: No rash, no edema Neurological Exam: alert and awake. No aphasia or dysarthria. Fund of knowledge is appropriate.  Recent and remote memory are intact.  Attention and concentration are normal.   Cranial nerves: Pupils equal, round. Extraocular movements intact with no nystagmus. Visual fields full.  No facial asymmetry.  Motor: Increased tone on right side with spastci contracture on right UE 4/5 proximal right UE and LE, 0/5 finger and wrist extension. Right foot in brace for foot drop. Finger to nose intact on left. Gait spastic hemiparetic (unchanged). No tremor.   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 MRI/MRV unremarkable. CTA showed cluster of vessels in the left sylvian fissure compatible with residual AVM. She has had significant improvement in symptoms but takes Tylenol BID for very low grade headaches. Discussed medication overuse headaches and minimizing rescue medication to 2-3 times a week. She will increase Depakote ER to $Rem'1000mg'dUwU$  qhs for migraine prophylaxis. She remains seizure-free since 1998 on Topiramate $RemoveBefor'300mg'VFZFdzslFAvN$  BID. Refills sent for Baclofen that  she takes for right leg spasticity. Follow-up in 6 months, call for any  changes.    Thank you for allowing me to participate in her care.  Please do not hesitate to call for any questions or concerns.   Ellouise Newer, M.D.   CC: Dr. Melford Aase

## 2020-09-01 NOTE — Patient Instructions (Signed)
1. Increase Depakote ER 500mg : Take 2 tablets every night  2. Tylenol or any other over the counter pain medication should only be taken 2-3 times a week, otherwise you can get rebound headaches (more headaches) if you take it on a daily basis  3. Refills sent for Topiramate 200mg  1 and 1/2 tablets twice a day and Baclofen 10mg .  4. Follow-up in 6 months, call for any changes   Seizure Precautions: 1. If medication has been prescribed for you to prevent seizures, take it exactly as directed.  Do not stop taking the medicine without talking to your doctor first, even if you have not had a seizure in a long time.   2. Avoid activities in which a seizure would cause danger to yourself or to others.  Don't operate dangerous machinery, swim alone, or climb in high or dangerous places, such as on ladders, roofs, or girders.  Do not drive unless your doctor says you may.  3. If you have any warning that you may have a seizure, lay down in a safe place where you can't hurt yourself.    4.  No driving for 6 months from last seizure, as per [facility].   Please refer to the following link on the [place] website for more information: http://[email or web address]/[email or web address]/drivingu.cfm   5.  Maintain good sleep hygiene.  6.  Contact your doctor if you have any problems that may be related to the medicine you are taking.  7.  Call 911 and bring the patient back to the ED if:        A.  The seizure lasts longer than 5 minutes.       B.  The patient doesn't awaken shortly after the seizure  C.  The patient has new problems such as difficulty seeing, speaking or moving  D.  The patient was injured during the seizure  E.  The patient has a temperature over 102 F (39C)  F.  The patient vomited and now is having trouble breathing

## 2020-09-01 NOTE — Progress Notes (Signed)
Future Appointments  Date Time Provider [place]  09/01/2020  1:00 PM Cameron Sprang, MD [facility] None  09/24/2020 10:00 AM Liane Comber, NP [facility] None  06/26/2021  9:00 AM Liane Comber, NP [facility] None     History of Present Illness:       Patient is a very nice 65 yo single WF with HTN, HLD, hx/o Seizure Disorder,Pre-Diabetes and Vitamin D Deficiency.  Patient was seen 10 days ago in the ER on 4/22  For evaluation after slipping/fall with c/o bilat hip pains. Xrays were negative for fx & she was able to ambulate with a cane/walker.   Medications    Current Outpatient Medications (Respiratory):  .  fexofenadine (ALLEGRA) 180 MG tablet, Take 180 mg by mouth daily.  Current Outpatient Medications (Analgesics):  .  butalbital-acetaminophen-caffeine (FIORICET) 50-325-40 MG tablet, Take 1 tablet as needed for severe headache. Do not take more than 2-3 a week.   Current Outpatient Medications (Other):  .  baclofen (LIORESAL) 10 MG tablet, Take 10mg  three times a day as needed. .  calcium carbonate (TUMS - DOSED IN MG ELEMENTAL CALCIUM) 500 MG chewable tablet, Chew 1 tablet by mouth daily. .  Calcium Citrate-Vitamin D (CALCIUM + D PO), Take 1 tablet by mouth daily.  .  divalproex (DEPAKOTE ER) 500 MG 24 hr tablet, Take 500 mg by mouth. Takes 1.5 tablet in the evening .  Multiple Vitamins-Minerals (MULTIVITAMIN WITH MINERALS) tablet, Take 1 tablet by mouth daily.  .  Omega-3 Fatty Acids (FISH OIL PO), Take 1 capsule by mouth daily. Marland Kitchen  POTASSIUM PO, Take 1 tablet by mouth daily. Marland Kitchen  Propylene Glycol (SYSTANE BALANCE) 0.6 % SOLN, Place 1 drop into both eyes 2 (two) times daily. Marland Kitchen  topiramate (TOPAMAX) 200 MG tablet, Takes 1 &1/2 tabs 2 x /day    (3 tabs /day) .  Vitamin D3 (VITAMIN D) 25 MCG tablet, Take 1,000 Units by mouth daily.   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   Observations/Objective:   BP 110/68   Pulse 68   Temp 97.7 F (36.5 C)   Resp 16   Ht 5\' 6"  (1.676 m)   Wt 152 lb 3.2 oz (69 kg)   SpO2 98%   BMI 24.57 kg/m   HEENT - WNL. Neck - supple.  Chest - Clear equal BS. Cor - Nl HS. RRR w/o sig MGR. PP 1(+). No edema. MS- Mild Rt spastic Hemi-paresis with gait -sl limping antalgic supported by a cane. Flexion contracturing of the Rt wrist, hand & fingers. (+) tender over Left lateral Buttock area. Nl bilat hip ROM in internal/external rotation and flexion /extention. Neuro -  Nl w/o focal abnormalities.  Assessment and Plan:  1. Contusion of buttock, subsequent encounter   Follow Up Instructions:        I discussed the assessment and treatment plan with the patient. The patient was provided an opportunity to ask questions and all were answered. The patient agreed with the plan and demonstrated an understanding of the instructions.       The patient was advised to call back or seek an in-person evaluation if the symptoms worsen or if the condition  fails to improve as anticipated.    Kirtland Bouchard, MD\

## 2020-09-03 ENCOUNTER — Encounter: Payer: Medicare Other | Admitting: Adult Health

## 2020-09-15 ENCOUNTER — Ambulatory Visit: Payer: Medicare Other | Admitting: Neurology

## 2020-09-22 ENCOUNTER — Encounter: Payer: Medicare Other | Admitting: Adult Health

## 2020-09-24 ENCOUNTER — Encounter: Payer: Medicare Other | Admitting: Adult Health

## 2020-10-07 ENCOUNTER — Ambulatory Visit: Payer: Medicare Other | Admitting: Neurology

## 2020-11-10 DIAGNOSIS — Z23 Encounter for immunization: Secondary | ICD-10-CM | POA: Diagnosis not present

## 2020-11-23 IMAGING — CR DG FEMUR 2+V*R*
4 series · 4 of 4 positions shown · non-contrast
Comparison: [DATE]

CLINICAL DATA: Hip pain post fall 3 hours ago

EXAM:
RIGHT FEMUR 2 VIEWS

[t femur proximal ap right]
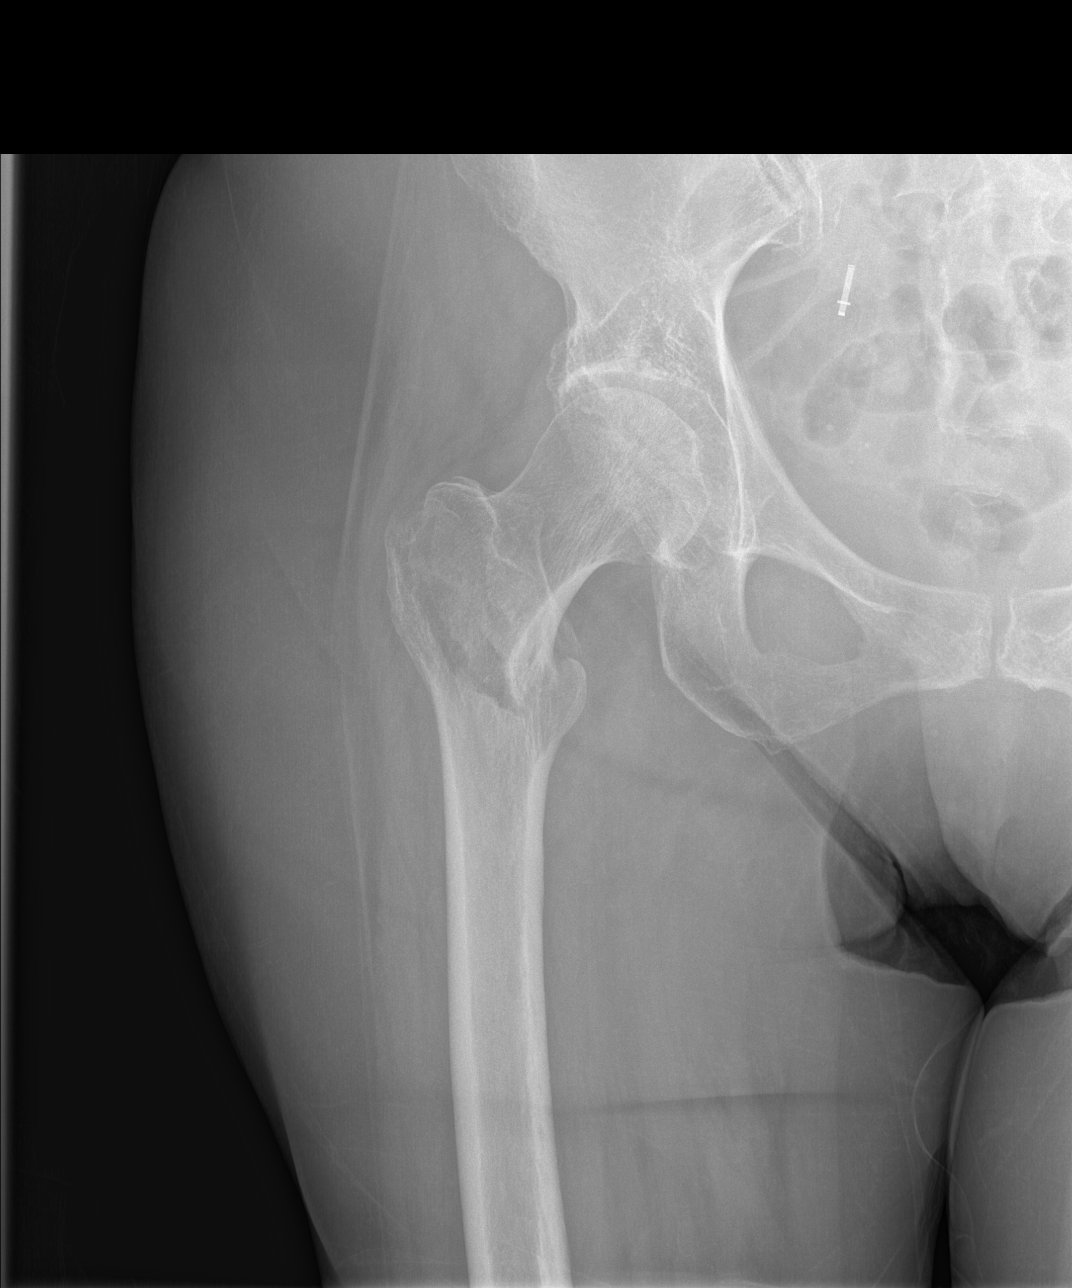

[t femur distal ap right]
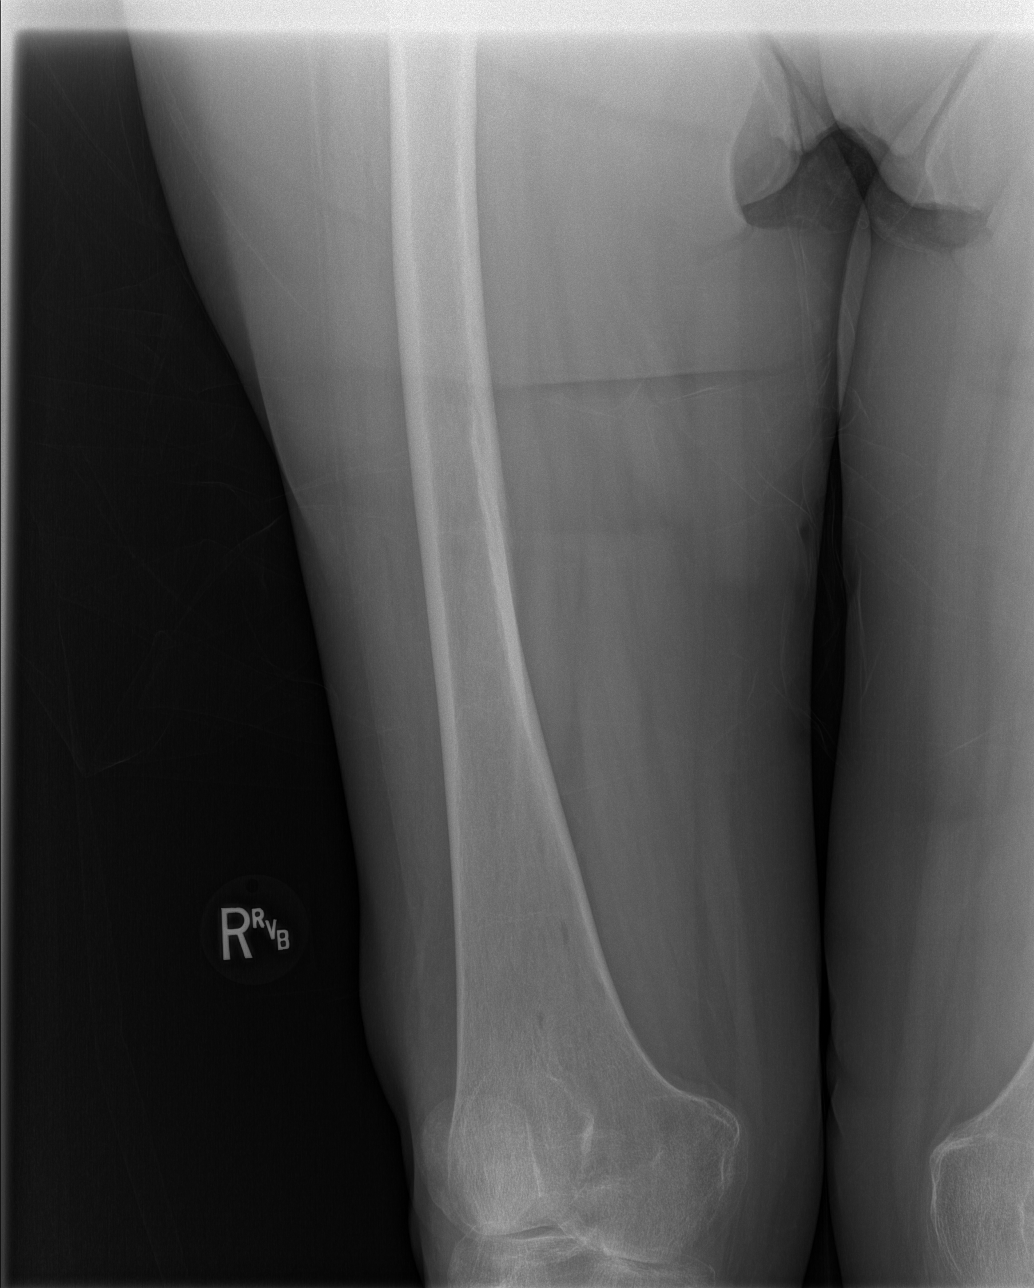

[t femur distal lat right]
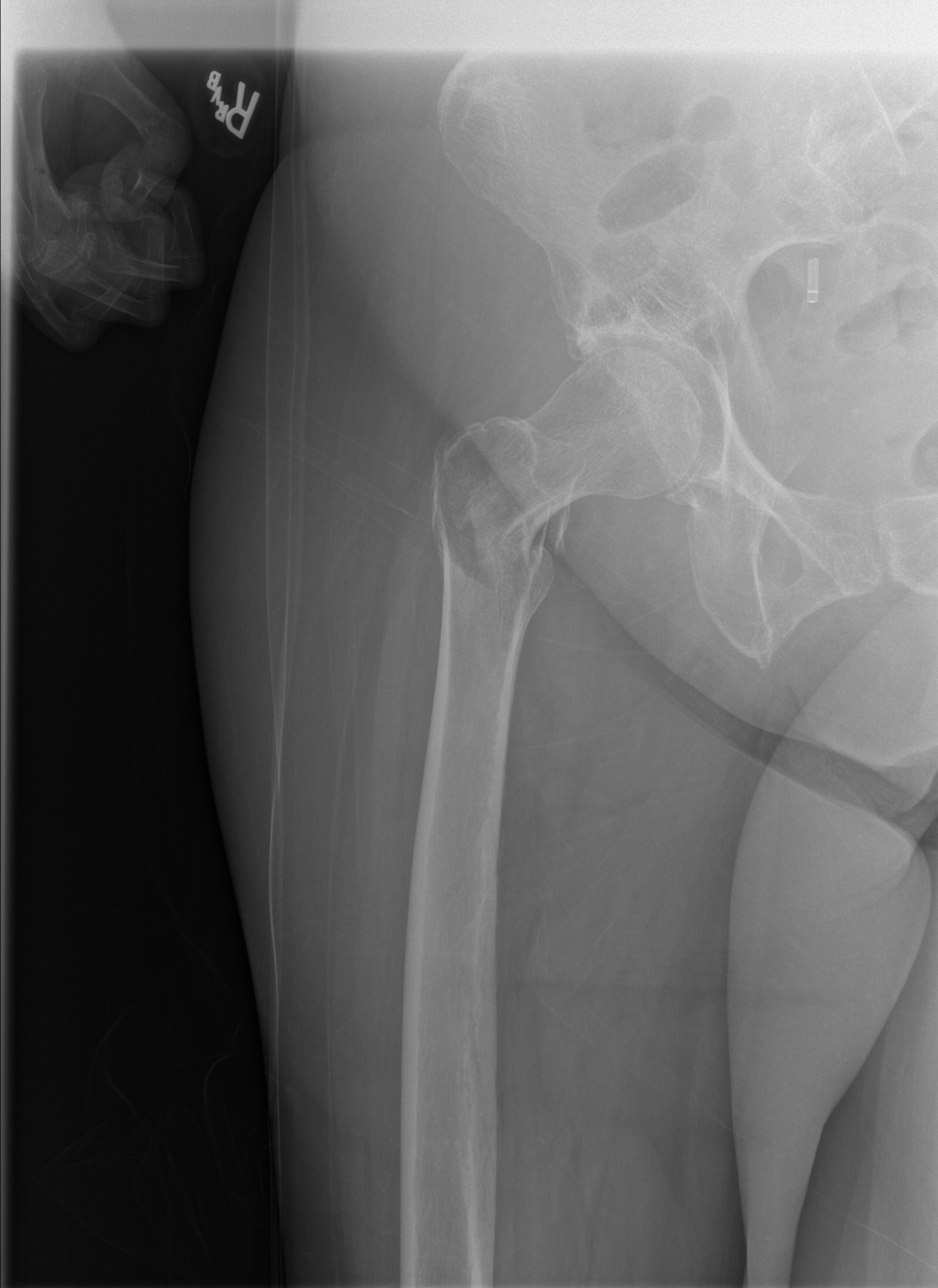

[t femur proximal lat right]
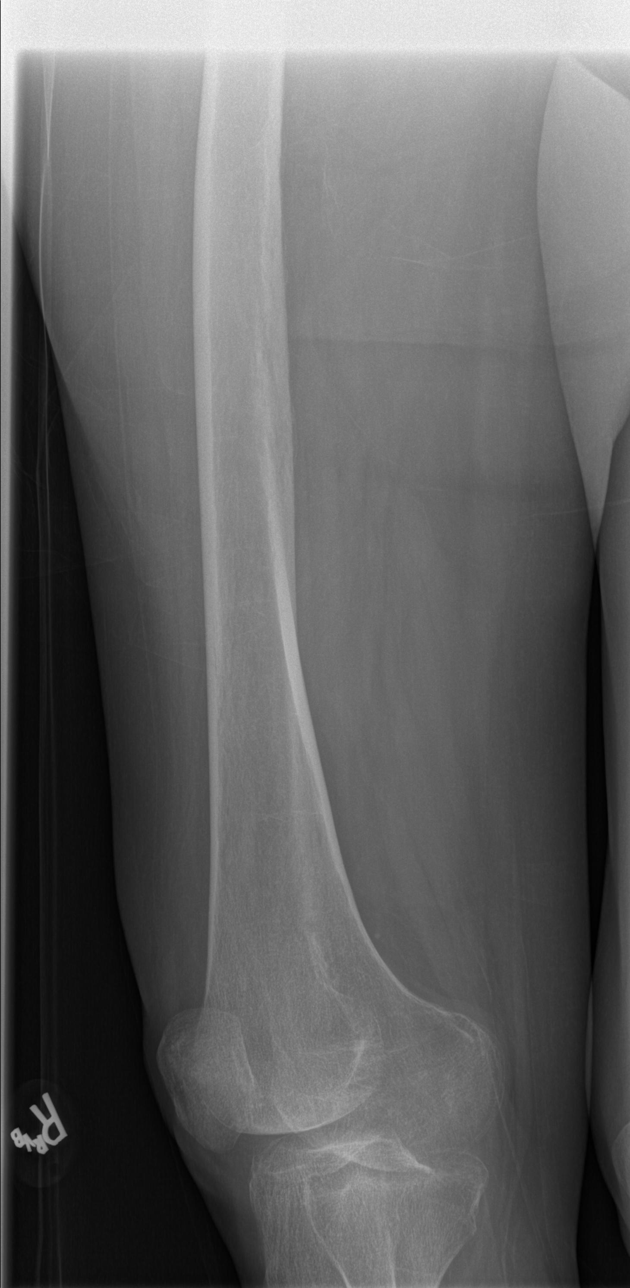

[4 of 4 positions shown; findings below may reference images not displayed]

FINDINGS: Osseous demineralization.

RIGHT hip and SI joint spaces preserved.

Minimally distracted and angulated intertrochanteric fracture RIGHT
femur.

No dislocation.

Visualized pelvis intact.
IMPRESSION: Minimally distracted and angulated intertrochanteric fracture RIGHT
femur.

## 2020-12-03 NOTE — Progress Notes (Signed)
Assessment and Plan: .  Tonya Thompson was seen today for fall.  Diagnoses and all orders for this visit:  Fall, initial encounter       - Pt advised she can use Aspercream pain on buttocks for muscle pain, given instructions on fall prevention  Further disposition pending results of labs. Discussed med's effects and SE's.   Over 30 minutes of exam, counseling, chart review, and critical decision making was performed.   Future Appointments  Date Time Provider [place]  12/18/2020 10:00 AM [facility] PREVISIT RM 50 [facility]  12/19/2020  9:00 AM Liane Comber, NP [facility] None  01/08/2021 11:30 AM Armbruster, Carlota Raspberry, MD [facility]  03/04/2021 11:30 AM Cameron Sprang, MD [facility] None  06/26/2021  9:00 AM Liane Comber, NP [facility] None    ------------------------------------------------------------------------------------------------------------------   HPI BP 112/70   Pulse 76   Temp (!) 97.1 F (36.2 C)   Resp 16   Wt 149 lb 6.4 oz (67.8 kg)   SpO2 95%   BMI 24.48 kg/m  65 y.o.female presents for pt fell on her buttocks and when she opened the door and bumped left side of head on 11/24/20 .  Is complaining of pain in buttocks bilaterally and was concerned she had a bruise on her forehead.  Past Medical History:  Diagnosis Date   Acute kidney injury ([place]) 09/03/2019   Allergy    seasonal allergies   Arthritis    LEFT hand   AVM (arteriovenous malformation) brain    Cataract    bilateral-not a surgical candidate at this time (07/23/2020)   GERD (gastroesophageal reflux disease)    on meds   Hyperlipidemia    diet controlled   Insulin resistance 02/16/2018   Seizures ([facility])    Grand Mal & Partial complex seizure disorder-last 1998   Stroke Tonya Thompson)    5 total so far (age 65)   Ureteral stone 05/10/2019     Allergies  Allergen Reactions   Aspirin Other (See Comments)    CONTRAINDICATED DUE TO HISTORY OF BLEEDING IN BRAIN   Cheese      Cant take due to history of severe migraines   Chocolate     Cant take due to history of severe migraines   Coffee Flavor     Cant take due to history of severe migraines   Other      Nuts - Cant take due to history of severe migraines   Red Wine Complex [Germanium]     Cant take due to history of severe migraines    Current Outpatient Medications on File Prior to Visit  Medication Sig   baclofen (LIORESAL) 10 MG tablet Take 1 tablet up to four times daily as needed   butalbital-acetaminophen-caffeine (FIORICET) 50-325-40 MG tablet Take 1 tablet as needed for severe headache. Do not take more than 2-3 a week.   calcium carbonate (TUMS - DOSED IN MG ELEMENTAL CALCIUM) 500 MG chewable tablet Chew 1 tablet by mouth daily.   Calcium Citrate-Vitamin D (CALCIUM + D PO) Take 1 tablet by mouth daily.    divalproex (DEPAKOTE ER) 500 MG 24 hr tablet Take 2 tablets every night   fexofenadine (ALLEGRA) 180 MG tablet Take 180 mg by mouth daily.   Multiple Vitamins-Minerals (MULTIVITAMIN WITH MINERALS) tablet Take 1 tablet by mouth daily.    Omega-3 Fatty Acids (FISH OIL PO) Take 1 capsule by mouth daily.   POTASSIUM PO Take 1 tablet by mouth daily.   Propylene Glycol (SYSTANE  BALANCE) 0.6 % SOLN Place 1 drop into both eyes 2 (two) times daily.   topiramate (TOPAMAX) 200 MG tablet Take 1 tablet three times a day   Vitamin D3 (VITAMIN D) 25 MCG tablet Take 1,000 Units by mouth daily.    No current facility-administered medications on file prior to visit.    ROS: all negative except above.   Physical Exam:  BP 112/70   Pulse 76   Temp (!) 97.1 F (36.2 C)   Resp 16   Wt 149 lb 6.4 oz (67.8 kg)   SpO2 95%   BMI 24.48 kg/m   General Appearance: Well nourished, in no apparent distress. Eyes: PERRLA, EOMs, conjunctiva no swelling or erythema Sinuses: No Frontal/maxillary tenderness ENT/Mouth: Ext aud canals clear, TMs without erythema, bulging. No erythema, swelling, or exudate on post  pharynx.  Tonsils not swollen or erythematous. Hearing normal.  Neck: Supple, thyroid normal.  Respiratory: Respiratory effort normal, BS equal bilaterally without rales, rhonchi, wheezing or stridor.  Cardio: RRR with no MRGs. Brisk peripheral pulses without edema.  Abdomen: Soft, + BS.  Non tender, no guarding, rebound, hernias, masses. Lymphatics: Non tender without lymphadenopathy.  Musculoskeletal: Impaired right side mobility and weakness, tenderness/tightness of piriformis muscle bilaterally Skin: Warm, dry without rashes, lesions, ecchymosis.  No bruising is noted on forehead or buttocks No swelling noted of forehead Neuro: Cranial nerves intact. Weakness right side, no cerebellar symptoms.  Psych: Awake and oriented X 3, normal affect, Insight and Judgment appropriate.     Tonya Bernheim, NP 11:30 AM [facility]

## 2020-12-05 ENCOUNTER — Encounter: Payer: Self-pay | Admitting: Nurse Practitioner

## 2020-12-05 ENCOUNTER — Ambulatory Visit (INDEPENDENT_AMBULATORY_CARE_PROVIDER_SITE_OTHER): Payer: Medicare Other | Admitting: Nurse Practitioner

## 2020-12-05 ENCOUNTER — Other Ambulatory Visit: Payer: Self-pay

## 2020-12-05 VITALS — BP 112/70 | HR 76 | Temp 97.1°F | Resp 16 | Wt 149.4 lb

## 2020-12-05 DIAGNOSIS — I10 Essential (primary) hypertension: Secondary | ICD-10-CM | POA: Diagnosis not present

## 2020-12-05 DIAGNOSIS — W19XXXA Unspecified fall, initial encounter: Secondary | ICD-10-CM

## 2020-12-05 NOTE — Patient Instructions (Signed)
Aspercream pain on buttocks bilaterally for muscle pain Can use Aspercream sparingly on forehead Fall Prevention in the Home, Adult Falls can cause injuries and can happen to people of all ages. There are many things you can do to make your home safe and to help prevent falls. Ask forhelp when making these changes. What actions can I take to prevent falls? General Instructions Use good lighting in all rooms. Replace any light bulbs that burn out. Turn on the lights in dark areas. Use night-lights. Keep items that you use often in easy-to-reach places. Lower the shelves around your home if needed. Set up your furniture so you have a clear path. Avoid moving your furniture around. Do not have throw rugs or other things on the floor that can make you trip. Avoid walking on wet floors. If any of your floors are uneven, fix them. Add color or contrast paint or tape to clearly mark and help you see: Grab bars or handrails. First and last steps of staircases. Where the edge of each step is. If you use a stepladder: Make sure that it is fully opened. Do not climb a closed stepladder. Make sure the sides of the stepladder are locked in place. Ask someone to hold the stepladder while you use it. Know where your pets are when moving through your home. What can I do in the bathroom?     Keep the floor dry. Clean up any water on the floor right away. Remove soap buildup in the tub or shower. Use nonskid mats or decals on the floor of the tub or shower. Attach bath mats securely with double-sided, nonslip rug tape. If you need to sit down in the shower, use a plastic, nonslip stool. Install grab bars by the toilet and in the tub and shower. Do not use towel bars as grab bars. What can I do in the bedroom? Make sure that you have a light by your bed that is easy to reach. Do not use any sheets or blankets for your bed that hang to the floor. Have a firm chair with side arms that you can use for  support when you get dressed. What can I do in the kitchen? Clean up any spills right away. If you need to reach something above you, use a step stool with a grab bar. Keep electrical cords out of the way. Do not use floor polish or wax that makes floors slippery. What can I do with my stairs? Do not leave any items on the stairs. Make sure that you have a light switch at the top and the bottom of the stairs. Make sure that there are handrails on both sides of the stairs. Fix handrails that are broken or loose. Install nonslip stair treads on all your stairs. Avoid having throw rugs at the top or bottom of the stairs. Choose a carpet that does not hide the edge of the steps on the stairs. Check carpeting to make sure that it is firmly attached to the stairs. Fix carpet that is loose or worn. What can I do on the outside of my home? Use bright outdoor lighting. Fix the edges of walkways and driveways and fix any cracks. Remove anything that might make you trip as you walk through a door, such as a raised step or threshold. Trim any bushes or trees on paths to your home. Check to see if handrails are loose or broken and that both sides of all steps have handrails. Install guardrails  along the edges of any raised decks and porches. Clear paths of anything that can make you trip, such as tools or rocks. Have leaves, snow, or ice cleared regularly. Use sand or salt on paths during winter. Clean up any spills in your garage right away. This includes grease or oil spills. What other actions can I take? Wear shoes that: Have a low heel. Do not wear high heels. Have rubber bottoms. Feel good on your feet and fit well. Are closed at the toe. Do not wear open-toe sandals. Use tools that help you move around if needed. These include: Canes. Walkers. Scooters. Crutches. Review your medicines with your doctor. Some medicines can make you feel dizzy. This can increase your chance of  falling. Ask your doctor what else you can do to help prevent falls. Where to find more information Centers for Disease Control and Prevention, [employer]: http://www.wolf.info/ National Institute on Aging: http://kim-miller.com/ Contact a doctor if: You are afraid of falling at home. You feel weak, drowsy, or dizzy at home. You fall at home. Summary There are many simple things that you can do to make your home safe and to help prevent falls. Ways to make your home safe include removing things that can make you trip and installing grab bars in the bathroom. Ask for help when making these changes in your home. This information is not intended to replace advice given to you by your health care provider. Make sure you discuss any questions you have with your healthcare provider. Document Revised: 11/21/2019 Document Reviewed: 11/21/2019 Elsevier Patient Education  [place].

## 2020-12-10 DIAGNOSIS — N200 Calculus of kidney: Secondary | ICD-10-CM | POA: Diagnosis not present

## 2020-12-18 ENCOUNTER — Ambulatory Visit (AMBULATORY_SURGERY_CENTER): Payer: Self-pay | Admitting: *Deleted

## 2020-12-18 ENCOUNTER — Other Ambulatory Visit: Payer: Self-pay

## 2020-12-18 VITALS — Ht 65.5 in | Wt 149.2 lb

## 2020-12-18 DIAGNOSIS — Z86010 Personal history of colonic polyps: Secondary | ICD-10-CM

## 2020-12-18 DIAGNOSIS — Z8673 Personal history of transient ischemic attack (TIA), and cerebral infarction without residual deficits: Secondary | ICD-10-CM | POA: Insufficient documentation

## 2020-12-18 DIAGNOSIS — R918 Other nonspecific abnormal finding of lung field: Secondary | ICD-10-CM | POA: Insufficient documentation

## 2020-12-18 NOTE — Progress Notes (Signed)

## 2020-12-18 NOTE — Progress Notes (Signed)
CPE  Assessment:   Encounter for Annual Physical Exam with abnormal findings Due annually  Health Maintenance reviewed Healthy lifestyle reviewed and goals set  Epilepsy, grand mal ([place]) Followed by Dr. Delice Lesch No recent  Partial symptomatic epilepsy with complex partial seizures, intractable, without status epilepticus ([place]) Followed by Dr. Delice Lesch Continue topamax; last seizure remote  Right hemiparesis ([place]) Continue regular exercise, PT as needed Continue rolling walker and foot brace   CKD (chronic kidney disease) stage 3  (HCC) -     COMPLETE METABOLIC PANEL WITH GFR Increase fluids, follow up urology, we are monitoring labs closely, nephrology didn't recommend further follow up  Labile hypertension -     CBC with Differential/Platelet -     COMPLETE METABOLIC PANEL WITH GFR -     TSH - continue medications, DASH diet, exercise and monitor at home. Call if greater than 130/80.   Abnormal glucose Discussed disease progression and risks Discussed diet/exercise, weight management and risk modification  Hyperlipidemia, mixed -     Lipid panel check lipids decrease fatty foods increase activity.   Headache, unspecified headache type Monitor, improved   Vitamin D deficiency Continue supplement  Medication management -     Magnesium  Abnormal glucose Discussed disease progression and risks Discussed diet/exercise, weight management and risk modification - Check A1C  Osteopenia, unspecified location Osteopenia- get dexa 2023, continue Vit D and Ca, weight bearing exercises  History of kidney stone S/p removal by Dr. Alinda Money; he will follow up annually  No concerns; push water intake  History of colon polyps  due for follow up- has scheduled 01/2021  Pulm nodules Incidentally seen, reports hx of passive smoke exposure as child Recommended 3-6 month follow up which is due CT discussed and order  Orders Placed This Encounter  Procedures   CT CHEST WO  CONTRAST   CBC with Differential/Platelet   COMPLETE METABOLIC PANEL WITH GFR   Magnesium   Lipid panel   TSH   Hemoglobin A1c   VITAMIN D 25 Hydroxy (Vit-D Deficiency, Fractures)   Microalbumin / creatinine urine ratio   Urinalysis, Routine w reflex microscopic   Ambulatory referral to Orthopedics   EKG 12-Lead     Over 40 minutes of exam, counseling, chart review and critical decision making was performed Future Appointments  Date Time Provider [place]  01/08/2021 11:30 AM Armbruster, Carlota Raspberry, MD [facility]  03/04/2021 11:30 AM Cameron Sprang, MD [facility] None  04/03/2021  9:30 AM Liane Comber, NP [facility] None  06/26/2021  9:00 AM Liane Comber, NP [facility] None  12/24/2021  9:00 AM Liane Comber, NP [facility] None     Plan:   During the course of the visit the patient was educated and counseled about appropriate screening and preventive services including:   Pneumococcal vaccine  Prevnar 13 Influenza vaccine Td vaccine Screening electrocardiogram Bone densitometry screening Colorectal cancer screening Diabetes screening Glaucoma screening Nutrition counseling  Advanced directives: requested   Subjective:  Tonya Thompson is a 65 y.o. female who presents for CPE. 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  She moved into friends home Aug 2021, her father, uncle and cousin  are there. Very happy there. [place] is provided. She walks with a cane/rolling walker, did have fall without injury, she does  her own mediations, no issues with food, some meals provided. Wears hearing aids.   Formerly followed with Dr. 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 Patient has residual R hemiparesis. Her last seizure reportedly was circa 1988. She is on topamax.  She was recently transitioned to local provider, Dr. Delice Lesch.   She recently had persistent HA/migraine, had CTA head/neck 07/24/2020 that showed Chronic left MCA infarct; also incidentally showed RUL nodule measuring 14 mm, dedicated non-contrast CT follow up in 3-6 months was recommended.  She is requesting referral to establish with local ortho provider, hx of intermittent lumbar pain episodes.   BMI is Body mass index is 24.58 kg/m., she has been working on diet and exercise, working on stretching/muscle training regimen. Pushing water intake.  Wt Readings from Last 3 Encounters:  12/19/20 150 lb (68 kg)  12/18/20 149 lb 3.2 oz (67.7 kg)  12/05/20 149 lb 6.4 oz (67.8 kg)   Her blood pressure has not been controlled at home, today their BP is BP: 114/70  She does workout. She denies chest pain, shortness of breath, dizziness.   She is not on cholesterol medication, taking omega 3 supplement only and denies myalgias. Her cholesterol is not at goal. The cholesterol last visit was:   Lab Results  Component Value Date   CHOL 205 (H) 06/26/2020   HDL 44 (L) 06/26/2020   LDLCALC 126 (H) 06/26/2020   TRIG 201 (H) 06/26/2020   CHOLHDL 4.7 06/26/2020    She has been working on diet and exercise for glucose management, and denies polydipsia and polyuria. Last A1C in the office was:  Lab Results  Component Value Date   HGBA1C 5.0 09/04/2019   She saw nephrology in 04/2019 for worsening Kidney function, went from GFR 56 in 08/2018 to GFR of 26/27 in 02/2019, had US renal and CT AB,  had stone removal with [facility] Jan 2021, has annual follow up.  Lab Results  Component Value Date   GFRNONAA 51 (L) 07/28/2020   GFRNONAA 49 (L) 07/24/2020   GFRNONAA 41 (L) 06/26/2020   Patient is on Vitamin D supplement.   Lab Results  Component Value Date   VD25OH 60 12/17/2019        Medication Review:    Current Outpatient Medications (Respiratory):    fexofenadine (ALLEGRA) 180 MG tablet, Take 180 mg by mouth daily.  Current Outpatient Medications (Analgesics):    butalbital-acetaminophen-caffeine (FIORICET) 50-325-40 MG tablet, Take 1 tablet as needed for severe headache. Do not take more than 2-3 a week.   Current Outpatient Medications (Other):    baclofen (LIORESAL) 10 MG tablet, Take 1 tablet up to four times daily as needed (Patient taking differently: Take 10 mg by mouth 2 (two) times daily. Take 1 tablet up to four times daily as needed- she takes 1-2 every evening)   calcium carbonate (TUMS - DOSED IN MG ELEMENTAL CALCIUM) 500 MG chewable tablet, Chew 1 tablet by mouth daily.   Calcium Citrate-Vitamin D (CALCIUM + D PO), Take 1 tablet by mouth daily.    divalproex (DEPAKOTE ER) 500 MG 24 hr tablet, Take 2 tablets every night  Multiple Vitamins-Minerals (MULTIVITAMIN WITH MINERALS) tablet, Take 1 tablet by mouth daily.    Omega-3 Fatty Acids (FISH OIL PO), Take 1 capsule by mouth daily.   POTASSIUM PO, Take 1 tablet by mouth daily.   Propylene Glycol (SYSTANE BALANCE) 0.6 % SOLN, Place 1 drop into both eyes 2 (two) times daily.   topiramate (TOPAMAX) 200 MG tablet, Take 1 tablet three times a day   Vitamin D3 (VITAMIN D) 25 MCG tablet, Take 1,000 Units by mouth daily.    Allergies  Allergen Reactions   Aspirin Other (See Comments)    CONTRAINDICATED DUE TO HISTORY OF BLEEDING IN BRAIN   Cheese     Cant take due to history of severe migraines   Chocolate     Cant take due to history of severe migraines   Coffee Flavor     Cant take due to history  of severe migraines   Other      Nuts - Cant take due to history of severe migraines   Red Wine Complex [Germanium]     Cant take due to history of severe migraines    Current Problems (verified) Patient Active Problem List   Diagnosis Date Noted   Dense breasts 12/19/2020   History of CVA (cerebrovascular accident) 12/18/2020   Pulmonary nodules 12/18/2020   History of colon polyps 06/26/2020   History of renal stone 09/04/2019   Medication management 02/16/2018   Osteopenia 01/10/2018   CKD (chronic kidney disease) stage 3, GFR 30-59 ml/min (HCC) 11/10/2017   Labile hypertension 06/01/2017   Hyperlipidemia, mixed 06/01/2017   Abnormal glucose 06/01/2017   Vitamin D deficiency 06/01/2017   Epilepsy, grand mal ([place]) 11/15/2016   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 ([place]) 11/15/2016   Headache, unspecified headache type 04/14/2016   Right hemiparesis ([place]) 12/29/2015    Screening Tests Immunization History  Administered Date(s) Administered   Influenza Inj Mdck Quad Pf 02/16/2017   Influenza Inj Mdck Quad With Preservative 02/16/2018, 03/01/2019   Influenza,inj,Quad PF,6+ Mos 03/01/2017   Influenza-Unspecified 03/01/2017, 12/17/2019   PFIZER(Purple Top)SARS-COV-2 Vaccination 07/22/2019, 08/12/2019, 03/10/2020, 11/10/2020   Pneumococcal-Unspecified 05/03/2004   Td 10/19/2019   Tdap 08/11/2009   Zoster, Live 04/14/2012   Preventative care: Last colonoscopy: [facility], due 5 years, was referred to Dr. Havery Moros, scheduled 01/08/2021 Last mammogram: 08/27/2020, dense breasts, was recommended 3D Last pap smear/pelvic exam: 12/2017 DONE DEXA: 01/2018 L fem T -1.1 schedule 2023  Prior vaccinations: TD or Tdap: 2011, due will defer unless cut due to cost  Influenza: 02/2020  Pneumococcal: 2006 Prevnar13: due age 78 Shingles/Zostavax: 04/2012 Covid 19: 2/2, pfizer, 2021 + last booster 7/202  Names of Other Physician/Practitioners  you currently use: 1. [facility] here for primary care 2. [facility], eye doctor, has scheduled 01/2021 3. Dr. Conley Canal, dentist, last visit 09/09/2020 4. Dr. Ronnald Ramp @ [facility] at [address] [place], derm, last 06/26/2020  Patient Care Team: Unk Pinto, MD as PCP - General (Internal Medicine) Cameron Sprang, MD as Consulting Physician (Neurology)  SURGICAL HISTORY She  has a past surgical history that includes Brain surgery (09/19/1978); Foot surgery (Right, 10/1985); Tubal ligation (1981); Cystoscopy/ureteroscopy/holmium laser/stent placement (Left, 05/10/2019); Wisdom tooth extraction; Colonoscopy (2017); and Polypectomy (2017). FAMILY HISTORY Her family history includes CAD in her maternal uncle; COPD in her maternal uncle; Colon polyps in her father; Dementia in her father and paternal uncle; Diabetes in her father; Heart attack in her maternal  grandfather and mother; Heart disease in her mother; Heart failure in her mother; Hyperlipidemia in her father and paternal uncle; Hypertension in her paternal uncle; Melanoma in her father; Prostate cancer in her father; Stroke in her paternal grandmother. SOCIAL HISTORY She  reports that she has never smoked. She has never used smokeless tobacco. She reports that she does not drink alcohol and does not use drugs.  Review of Systems  Constitutional:  Negative for malaise/fatigue and weight loss.  HENT:  Negative for hearing loss and tinnitus.   Eyes:  Negative for blurred vision and double vision.  Respiratory:  Negative for cough, sputum production, shortness of breath and wheezing.   Cardiovascular:  Negative for chest pain, palpitations, orthopnea, claudication, leg swelling and PND.  Gastrointestinal:  Negative for abdominal pain, blood in stool, constipation, diarrhea, heartburn, melena, nausea and vomiting.  Genitourinary: Negative.   Musculoskeletal:  Positive for falls (1  fall without injury). Negative for joint pain and myalgias.  Skin:  Negative for rash.  Neurological:  Positive for headaches (neuro following). Negative for dizziness, tingling, sensory change and weakness.  Endo/Heme/Allergies:  Negative for polydipsia.  Psychiatric/Behavioral: Negative.  Negative for depression, memory loss, substance abuse and suicidal ideas. The patient is not nervous/anxious and does not have insomnia.   All other systems reviewed and are negative.  Objective:     Today's Vitals   12/19/20 0855  BP: 114/70  Pulse: 80  Resp: 16  Temp: (!) 97.3 F (36.3 C)  SpO2: 99%  Weight: 150 lb (68 kg)  Height: 5' 5.5" (1.664 m)   Body mass index is 24.58 kg/m.  General appearance: alert, no distress, WD/WN, female HEENT: normocephalic, sclerae anicteric, TMs pearly, nares patent, no discharge or erythema, pharynx normal Oral cavity: MMM, no lesions Neck: supple, no lymphadenopathy, no thyromegaly, no masses Heart: RRR, normal S1, S2, no murmurs Lungs: CTA bilaterally, no wheezes, rhonchi, or rales Abdomen: +bs, soft, non tender, non distended, no masses, no hepatomegaly, no splenomegaly Musculoskeletal: nontender, no swelling, no obvious deformity Extremities: no edema, no cyanosis, no clubbing Pulses: 2+ symmetric, upper and lower extremities, normal cap refill Neurological: alert, oriented x 3, CN2-12 intact, strength normal upper extremities and lower extremities left side, right side with decreased strength and spastic right hip and right hand contracture, right ankle in brace, no cerebellar signs, gait abnormal, patient walks with rolling walker Psychiatric: flat affect, behavior normal, pleasant  Breasts: dense, getting annual mammogram, no concerns GU: no concerns, defer  EKG: Sinus brady, NSCPT     Izora Ribas, NP   12/19/2020

## 2020-12-19 ENCOUNTER — Encounter: Payer: Self-pay | Admitting: Adult Health

## 2020-12-19 ENCOUNTER — Ambulatory Visit (INDEPENDENT_AMBULATORY_CARE_PROVIDER_SITE_OTHER): Payer: Medicare Other | Admitting: Adult Health

## 2020-12-19 VITALS — BP 114/70 | HR 80 | Temp 97.3°F | Resp 16 | Ht 65.5 in | Wt 150.0 lb

## 2020-12-19 DIAGNOSIS — R9230 Dense breasts, unspecified: Secondary | ICD-10-CM | POA: Insufficient documentation

## 2020-12-19 DIAGNOSIS — Z87442 Personal history of urinary calculi: Secondary | ICD-10-CM

## 2020-12-19 DIAGNOSIS — E782 Mixed hyperlipidemia: Secondary | ICD-10-CM

## 2020-12-19 DIAGNOSIS — Z136 Encounter for screening for cardiovascular disorders: Secondary | ICD-10-CM

## 2020-12-19 DIAGNOSIS — Z131 Encounter for screening for diabetes mellitus: Secondary | ICD-10-CM | POA: Diagnosis not present

## 2020-12-19 DIAGNOSIS — R7309 Other abnormal glucose: Secondary | ICD-10-CM | POA: Diagnosis not present

## 2020-12-19 DIAGNOSIS — N1832 Chronic kidney disease, stage 3b: Secondary | ICD-10-CM

## 2020-12-19 DIAGNOSIS — Z8673 Personal history of transient ischemic attack (TIA), and cerebral infarction without residual deficits: Secondary | ICD-10-CM

## 2020-12-19 DIAGNOSIS — R918 Other nonspecific abnormal finding of lung field: Secondary | ICD-10-CM

## 2020-12-19 DIAGNOSIS — M8580 Other specified disorders of bone density and structure, unspecified site: Secondary | ICD-10-CM

## 2020-12-19 DIAGNOSIS — G40219 Localization-related (focal) (partial) symptomatic epilepsy and epileptic syndromes with complex partial seizures, intractable, without status epilepticus: Secondary | ICD-10-CM

## 2020-12-19 DIAGNOSIS — R0989 Other specified symptoms and signs involving the circulatory and respiratory systems: Secondary | ICD-10-CM

## 2020-12-19 DIAGNOSIS — R001 Bradycardia, unspecified: Secondary | ICD-10-CM | POA: Diagnosis not present

## 2020-12-19 DIAGNOSIS — G8191 Hemiplegia, unspecified affecting right dominant side: Secondary | ICD-10-CM

## 2020-12-19 DIAGNOSIS — Z79899 Other long term (current) drug therapy: Secondary | ICD-10-CM | POA: Diagnosis not present

## 2020-12-19 DIAGNOSIS — M545 Low back pain, unspecified: Secondary | ICD-10-CM

## 2020-12-19 DIAGNOSIS — R922 Inconclusive mammogram: Secondary | ICD-10-CM

## 2020-12-19 DIAGNOSIS — E559 Vitamin D deficiency, unspecified: Secondary | ICD-10-CM | POA: Diagnosis not present

## 2020-12-19 DIAGNOSIS — Z1329 Encounter for screening for other suspected endocrine disorder: Secondary | ICD-10-CM

## 2020-12-19 DIAGNOSIS — Z1389 Encounter for screening for other disorder: Secondary | ICD-10-CM | POA: Diagnosis not present

## 2020-12-19 DIAGNOSIS — Z6824 Body mass index (BMI) 24.0-24.9, adult: Secondary | ICD-10-CM

## 2020-12-19 DIAGNOSIS — Z86010 Personal history of colonic polyps: Secondary | ICD-10-CM

## 2020-12-19 DIAGNOSIS — Z0001 Encounter for general adult medical examination with abnormal findings: Secondary | ICD-10-CM

## 2020-12-19 NOTE — Patient Instructions (Addendum)
[name] , Thank you for taking time to come for your Annual Wellness Visit. I appreciate your ongoing commitment to your health goals. Please review the following plan we discussed and let me know if I can assist you in the future.   These are the goals we discussed:  Goals      DIET - INCREASE WATER INTAKE     4+ bottles (64 fluid ounces) daily      Exercise 150 min/wk Moderate Activity     LDL CALC < 100        This is a list of the screening recommended for you and due dates:  Health Maintenance  Topic Date Due   Zoster (Shingles) Vaccine (1 of 2) Never done   Colon Cancer Screening  07/01/2020   Flu Shot  12/01/2020   Pap Smear  12/06/2020   Pneumococcal Vaccination (2 - PCV) 12/20/2023*   COVID-19 Vaccine (5 - Booster for Pfizer series) 03/13/2021   Mammogram  08/28/2022   Tetanus Vaccine  10/18/2029   HPV Vaccine  Aged Out   Hepatitis C Screening: USPSTF Recommendation to screen - Ages 18-79 yo.  Discontinued   HIV Screening  Discontinued  *Topic was postponed. The date shown is not the original due date.      Ask insurance if they cover shingrix vaccine -     Know what a healthy weight is for you (roughly BMI <25) and aim to maintain this  Aim for 7+ servings of fruits and vegetables daily  65-80+ fluid ounces of water or unsweet tea for healthy kidneys  Limit to max 1 drink of alcohol per day; avoid smoking/tobacco  Limit animal fats in diet for cholesterol and heart health - choose grass fed whenever available  Avoid highly processed foods, and foods high in saturated/trans fats  Aim for low stress - take time to unwind and care for your mental health  Aim for 150 min of moderate intensity exercise weekly for heart health, and weights twice weekly for bone health  Aim for 7-9 hours of sleep daily     Fat and Cholesterol Restricted Eating Plan Eating a diet that limits fat and cholesterol may help lower your risk for heart disease and other  conditions. Your body needs fat and cholesterol for basic functions, but eating too much of these things can be harmful to yourhealth. Your health care provider may order lab tests to check your blood fat (lipid) and cholesterol levels. This helps your health care provider understand your risk for certain conditions and whether you need to make diet changes. Work with your health care provider or dietitian to make an eating plan that isright for you. Your plan includes: Limit your fat intake to ______% or less of your total calories a day. Limit your saturated fat intake to ______% or less of your total calories a day. Limit the amount of cholesterol in your diet to less than _________mg a day. Eat ___________ g of fiber a day. What are tips for following this plan? General guidelines  If you are overweight, work with your health care provider to lose weight safely. Losing just 5-10% of your body weight can improve your overall health and help prevent diseases such as diabetes and heart disease. Avoid: Foods with added sugar. Fried foods. Foods that contain partially hydrogenated oils, including stick margarine, some tub margarines, cookies, crackers, and other baked goods. Limit alcohol intake to no more than 1 drink a day for nonpregnant women  and 2 drinks a day for men. One drink equals 12 oz of beer, 5 oz of wine, or 1 oz of hard liquor.  Reading food labels Check food labels for: Trans fats, partially hydrogenated oils, or high amounts of saturated fat. Avoid foods that contain saturated fat and trans fat. The amount of cholesterol in each serving. Try to eat no more than 200 mg of cholesterol each day. The amount of fiber in each serving. Try to eat at least 20-30 g of fiber each day. Choose foods with healthy fats, such as: Monounsaturated and polyunsaturated fats. These include olive and canola oil, flaxseeds, walnuts, almonds, and seeds. Omega-3 fats. These are found in foods such  as salmon, mackerel, sardines, tuna, flaxseed oil, and ground flaxseeds. Choose grain products that have whole grains. Look for the word "whole" as the first word in the ingredient list. Cooking Cook foods using methods other than frying. Baking, boiling, grilling, and broiling are some healthy options. Eat more home-cooked food and less restaurant, buffet, and fast food. Avoid cooking using saturated fats. Animal sources of saturated fats include meats, butter, and cream. Plant sources of saturated fats include palm oil, palm kernel oil, and coconut oil. Meal planning  At meals, imagine dividing your plate into fourths: Fill one-half of your plate with vegetables and green salads. Fill one-fourth of your plate with whole grains. Fill one-fourth of your plate with lean protein foods. Eat fish that is high in omega-3 fats at least two times a week. Eat more foods that contain fiber, such as whole grains, beans, apples, broccoli, carrots, peas, and barley. These foods help promote healthy cholesterol levels in the blood.  Recommended foods Grains Whole grains, such as whole wheat or whole grain breads, crackers, cereals, and pasta. Unsweetened oatmeal, bulgur, barley, quinoa, or brown rice. Corn or whole wheat flour tortillas. Vegetables Fresh or frozen vegetables (raw, steamed, roasted, or grilled). Green salads. Fruits All fresh, canned (in natural juice), or frozen fruits. Meats and other protein foods Ground beef (85% or leaner), grass-fed beef, or beef trimmed of fat. Skinless chicken or [place]. Ground chicken or [place]. Pork trimmed of fat. All fish and seafood. Egg whites. Dried beans, peas, or lentils. Unsalted nuts or seeds. Unsalted canned beans. Natural nut butters without added sugar and oil. Dairy Low-fat or nonfat dairy products, such as skim or 1% milk, 2% or reduced-fat cheeses, low-fat and fat-free ricotta or cottage cheese, or plain low-fat and nonfat yogurt. Fats and  oils Tub margarine without trans fats. Light or reduced-fat mayonnaise and salad dressings. Avocado. Olive, canola, sesame, or safflower oils. The items listed above may not be a complete list of foods and beverages you can eat. Contact a dietitian for more information. Foods to avoid Grains White bread. White pasta. White rice. Cornbread. Bagels, pastries, and croissants. Crackers and snack foods that contain trans fat and hydrogenated oils. Vegetables Vegetables cooked in cheese, cream, or butter sauce. Fried vegetables. Fruits Canned fruit in heavy syrup. Fruit in cream or butter sauce. Fried fruit. Meats and other protein foods Fatty cuts of meat. Ribs, chicken wings, bacon, sausage, bologna, salami, chitterlings, fatback, hot dogs, bratwurst, and packaged lunch meats. Liver and organ meats. Whole eggs and egg yolks. Chicken and [place] with skin. Fried meat. Dairy Whole or 2% milk, cream, half-and-half, and cream cheese. Whole milk cheeses. Whole-fat or sweetened yogurt. Full-fat cheeses. Nondairy creamers and whipped toppings. Processed cheese, cheese spreads, and cheese curds. Beverages Alcohol. Sugar-sweetened drinks such as sodas,  lemonade, and fruit drinks. Fats and oils Butter, stick margarine, lard, shortening, ghee, or bacon fat. Coconut, palm kernel, and palm oils. Sweets and desserts Corn syrup, sugars, honey, and molasses. Candy. Jam and jelly. Syrup. Sweetened cereals. Cookies, pies, cakes, donuts, muffins, and ice cream. The items listed above may not be a complete list of foods and beverages you should avoid. Contact a dietitian for more information. Summary Your body needs fat and cholesterol for basic functions. However, eating too much of these things can be harmful to your health. Work with your health care provider and dietitian to follow a diet low in fat and cholesterol. Doing this may help lower your risk for heart disease and other conditions. Choose healthy fats,  such as monounsaturated and polyunsaturated fats, and foods high in omega-3 fatty acids. Eat fiber-rich foods, such as whole grains, beans, peas, fruits, and vegetables. Limit or avoid alcohol, fried foods, and foods high in saturated fats, partially hydrogenated oils, and sugar. This information is not intended to replace advice given to you by your health care provider. Make sure you discuss any questions you have with your healthcare provider. Document Revised: 12/19/2019 Document Reviewed: 08/22/2019 Elsevier Patient Education  2022 [employer].

## 2020-12-20 LAB — COMPLETE METABOLIC PANEL WITH GFR
AG Ratio: 1.4 (calc) (ref 1.0–2.5)
ALT: 22 U/L (ref 6–29)
AST: 19 U/L (ref 10–35)
Albumin: 3.7 g/dL (ref 3.6–5.1)
Alkaline phosphatase (APISO): 87 U/L (ref 37–153)
BUN/Creatinine Ratio: 23 (calc) — ABNORMAL HIGH (ref 6–22)
BUN: 26 mg/dL — ABNORMAL HIGH (ref 7–25)
CO2: 26 mmol/L (ref 20–32)
Calcium: 8.8 mg/dL (ref 8.6–10.4)
Chloride: 109 mmol/L (ref 98–110)
Creat: 1.12 mg/dL — ABNORMAL HIGH (ref 0.50–1.05)
Globulin: 2.6 g/dL (calc) (ref 1.9–3.7)
Glucose, Bld: 79 mg/dL (ref 65–99)
Potassium: 3.8 mmol/L (ref 3.5–5.3)
Sodium: 141 mmol/L (ref 135–146)
Total Bilirubin: 0.4 mg/dL (ref 0.2–1.2)
Total Protein: 6.3 g/dL (ref 6.1–8.1)
eGFR: 56 mL/min/{1.73_m2} — ABNORMAL LOW (ref >=60)

## 2020-12-20 LAB — LIPID PANEL
Cholesterol: 177 mg/dL (ref <200)
HDL: 49 mg/dL — ABNORMAL LOW (ref >=50)
LDL Cholesterol (Calc): 100 mg/dL (calc) — ABNORMAL HIGH
Non-HDL Cholesterol (Calc): 128 mg/dL (calc) (ref <130)
Total CHOL/HDL Ratio: 3.6 (calc) (ref <5.0)
Triglycerides: 179 mg/dL — ABNORMAL HIGH (ref <150)

## 2020-12-20 LAB — CBC WITH DIFFERENTIAL/PLATELET
Absolute Monocytes: 442 cells/uL (ref 200–950)
Basophils Absolute: 42 cells/uL (ref 0–200)
Basophils Relative: 0.9 %
Eosinophils Absolute: 80 cells/uL (ref 15–500)
Eosinophils Relative: 1.7 %
HCT: 40.4 % (ref 35.0–45.0)
Hemoglobin: 13.1 g/dL (ref 11.7–15.5)
Lymphs Abs: 1551 cells/uL (ref 850–3900)
MCH: 32.8 pg (ref 27.0–33.0)
MCHC: 32.4 g/dL (ref 32.0–36.0)
MCV: 101 fL — ABNORMAL HIGH (ref 80.0–100.0)
MPV: 10.5 fL (ref 7.5–12.5)
Monocytes Relative: 9.4 %
Neutro Abs: 2585 cells/uL (ref 1500–7800)
Neutrophils Relative %: 55 %
Platelets: 204 10*3/uL (ref 140–400)
RBC: 4 10*6/uL (ref 3.80–5.10)
RDW: 13 % (ref 11.0–15.0)
Total Lymphocyte: 33 %
WBC: 4.7 10*3/uL (ref 3.8–10.8)

## 2020-12-20 LAB — HEMOGLOBIN A1C
Hgb A1c MFr Bld: 4.8 % of total Hgb (ref <5.7)
Mean Plasma Glucose: 91 mg/dL
eAG (mmol/L): 5 mmol/L

## 2020-12-20 LAB — TSH: TSH: 2.41 mIU/L (ref 0.40–4.50)

## 2020-12-20 LAB — MAGNESIUM: Magnesium: 2 mg/dL (ref 1.5–2.5)

## 2020-12-20 LAB — VITAMIN D 25 HYDROXY (VIT D DEFICIENCY, FRACTURES): Vit D, 25-Hydroxy: 73 ng/mL (ref 30–100)

## 2020-12-25 DIAGNOSIS — N1832 Chronic kidney disease, stage 3b: Secondary | ICD-10-CM | POA: Diagnosis not present

## 2020-12-25 DIAGNOSIS — Z1389 Encounter for screening for other disorder: Secondary | ICD-10-CM | POA: Diagnosis not present

## 2020-12-25 DIAGNOSIS — E782 Mixed hyperlipidemia: Secondary | ICD-10-CM | POA: Diagnosis not present

## 2020-12-25 DIAGNOSIS — R7309 Other abnormal glucose: Secondary | ICD-10-CM | POA: Diagnosis not present

## 2020-12-25 DIAGNOSIS — Z79899 Other long term (current) drug therapy: Secondary | ICD-10-CM | POA: Diagnosis not present

## 2020-12-25 DIAGNOSIS — Z0001 Encounter for general adult medical examination with abnormal findings: Secondary | ICD-10-CM | POA: Diagnosis not present

## 2020-12-25 DIAGNOSIS — Z131 Encounter for screening for diabetes mellitus: Secondary | ICD-10-CM | POA: Diagnosis not present

## 2020-12-25 DIAGNOSIS — Z1329 Encounter for screening for other suspected endocrine disorder: Secondary | ICD-10-CM | POA: Diagnosis not present

## 2020-12-25 DIAGNOSIS — E559 Vitamin D deficiency, unspecified: Secondary | ICD-10-CM | POA: Diagnosis not present

## 2020-12-25 DIAGNOSIS — R0989 Other specified symptoms and signs involving the circulatory and respiratory systems: Secondary | ICD-10-CM | POA: Diagnosis not present

## 2020-12-26 LAB — MICROSCOPIC MESSAGE

## 2020-12-26 LAB — URINALYSIS, ROUTINE W REFLEX MICROSCOPIC
Bacteria, UA: NONE SEEN /HPF
Bilirubin Urine: NEGATIVE
Glucose, UA: NEGATIVE
Ketones, ur: NEGATIVE
Nitrite: NEGATIVE
RBC / HPF: >=60 /HPF — AB (ref 0–2)
Specific Gravity, Urine: 1.013 (ref 1.001–1.035)
Squamous Epithelial / HPF: NONE SEEN /HPF (ref <=5)
pH: 7.5 (ref 5.0–8.0)

## 2020-12-26 LAB — MICROALBUMIN / CREATININE URINE RATIO
Creatinine, Urine: 74 mg/dL (ref 20–275)
Microalb Creat Ratio: 436 mcg/mg creat — ABNORMAL HIGH (ref <30)
Microalb, Ur: 32.3 mg/dL

## 2021-01-01 ENCOUNTER — Telehealth: Payer: Self-pay | Admitting: Gastroenterology

## 2021-01-01 NOTE — Telephone Encounter (Signed)
CALLED PT 1114 AM- WENT TO VOICE MAIL LEFT MESSAGE TO RETURN MY CALL   MARIE PV

## 2021-01-01 NOTE — Telephone Encounter (Signed)
CALLED AGAIN 1125- NO ANSWER, WENT TO VM AGAIN-  LM TO RC   MARIE

## 2021-01-01 NOTE — Telephone Encounter (Signed)
PT WAS CONFUSED ABOUT WHEN TO START CLEAR LIQUIDS- SHE THOUGHT SHE STARTED CLEARS SAT 9-3, BUT THIS IS  THE DATE OF HER DIET CHANGES-  CLEARS ARE TO START WED 9-7 THRU COLON 9-8- WE WENT OVER 9-3 DIET CHANGES AND 9-7 CLEAR LIQUIDS AND PREP 9-7 AND 9-8- PT VERBALIZED UNDERSTANDING

## 2021-01-02 ENCOUNTER — Encounter: Payer: Medicare Other | Admitting: Gastroenterology

## 2021-01-08 ENCOUNTER — Ambulatory Visit (AMBULATORY_SURGERY_CENTER): Payer: Medicare Other | Admitting: Gastroenterology

## 2021-01-08 ENCOUNTER — Encounter: Payer: Self-pay | Admitting: Gastroenterology

## 2021-01-08 ENCOUNTER — Other Ambulatory Visit: Payer: Self-pay

## 2021-01-08 VITALS — BP 143/80 | HR 64 | Temp 98.1°F | Resp 14 | Ht 65.5 in | Wt 149.2 lb

## 2021-01-08 DIAGNOSIS — K635 Polyp of colon: Secondary | ICD-10-CM | POA: Diagnosis not present

## 2021-01-08 DIAGNOSIS — D128 Benign neoplasm of rectum: Secondary | ICD-10-CM | POA: Diagnosis not present

## 2021-01-08 DIAGNOSIS — Z86010 Personal history of colonic polyps: Secondary | ICD-10-CM | POA: Diagnosis not present

## 2021-01-08 DIAGNOSIS — D127 Benign neoplasm of rectosigmoid junction: Secondary | ICD-10-CM | POA: Diagnosis not present

## 2021-01-08 DIAGNOSIS — D125 Benign neoplasm of sigmoid colon: Secondary | ICD-10-CM | POA: Diagnosis not present

## 2021-01-08 DIAGNOSIS — D123 Benign neoplasm of transverse colon: Secondary | ICD-10-CM

## 2021-01-08 DIAGNOSIS — K621 Rectal polyp: Secondary | ICD-10-CM | POA: Diagnosis not present

## 2021-01-08 MED ORDER — SODIUM CHLORIDE 0.9 % IV SOLN: 500.0000 mL | Freq: Once | INTRAVENOUS | Status: DC

## 2021-01-08 NOTE — Op Note (Signed)
[facility] Patient Name: Tonya Thompson Procedure Date: 01/08/2021 11:53 AM MRN: [id number] Endoscopist: Remo Lipps P. Havery Moros , MD Age: 65 Referring MD:  Date of Birth: January 02, 1956 Gender: Female Account #: [id number] Procedure:                Colonoscopy Indications:              High risk colon cancer surveillance: Personal                            history of colonic polyps - 2017 - 3 adenomas /                            sessile serrated polyps Medicines:                Monitored Anesthesia Care Procedure:                Pre-Anesthesia Assessment:                           - Prior to the procedure, a History and Physical                            was performed, and patient medications and                            allergies were reviewed. The patient's tolerance of                            previous anesthesia was also reviewed. The risks                            and benefits of the procedure and the sedation                            options and risks were discussed with the patient.                            All questions were answered, and informed consent                            was obtained. Prior Anticoagulants: The patient has                            taken no previous anticoagulant or antiplatelet                            agents. ASA Grade Assessment: III - A patient with                            severe systemic disease. After reviewing the risks                            and benefits, the patient was deemed in  satisfactory condition to undergo the procedure.                           After obtaining informed consent, the colonoscope                            was passed under direct vision. Throughout the                            procedure, the patient's blood pressure, pulse, and                            oxygen saturations were monitored continuously. The                            Olympus PCF-H190DL (#[id number])  Colonoscope was                            introduced through the anus and advanced to the the                            cecum, identified by appendiceal orifice and                            ileocecal valve. The colonoscopy was performed                            without difficulty. The patient tolerated the                            procedure well. The quality of the bowel                            preparation was adequate. The ileocecal valve,                            appendiceal orifice, and rectum were photographed. Scope In: 45:62:56 PM Scope Out: 12:45:36 PM Scope Withdrawal Time: 0 hours 33 minutes 25 seconds  Total Procedure Duration: 0 hours 38 minutes 39 seconds  Findings:                 The perianal and digital rectal examinations were                            normal.                           Four sessile polyps were found in the transverse                            colon. The polyps were 3 to 4 mm in size. These                            polyps were removed with a cold snare. Resection  and retrieval were complete. (placed in jar 1)                           Two flat polyps were found in the sigmoid colon.                            The polyps were 4 to 5 mm in size. These polyps                            were removed with a cold snare. Resection and                            retrieval were complete. (placed in jar 2)                           A 10 mm polyp was found in the sigmoid colon. The                            polyp was sessile and wrapped around an angulated                            fold. The polyp was removed with a cold snare, but                            was technically challenging to remove given its                            location, was difficult to grasp. Resection and                            retrieval were complete. (placed in jar 3)                           A 4 mm polyp was found in the sigmoid colon. The                             polyp was sessile. The polyp was removed with a                            cold snare. Resection and retrieval were complete.                            (placed in jar 3)                           A 4 mm polyp was found in the rectum. The polyp was                            sessile. The polyp was removed with a cold snare.  Resection and retrieval were complete.                           The colon was tortuous.                           The exam was otherwise without abnormality. Complications:            No immediate complications. Estimated blood loss:                            Minimal. Estimated Blood Loss:     Estimated blood loss was minimal. Impression:               - Four 3 to 4 mm polyps in the transverse colon,                            removed with a cold snare. Resected and retrieved.                           - Two 4 to 5 mm polyps in the sigmoid colon,                            removed with a cold snare. Resected and retrieved.                           - One 10 mm polyp in the sigmoid colon, removed                            with a cold snare. Resected and retrieved.                           - One 4 mm polyp in the sigmoid colon, removed with                            a cold snare. Resected and retrieved.                           - One 4 mm polyp in the rectum, removed with a cold                            snare. Resected and retrieved.                           - Tortuous colon.                           - The examination was otherwise normal. Recommendation:           - Patient has a contact number available for                            emergencies. The signs and symptoms of potential  delayed complications were discussed with the                            patient. Return to normal activities tomorrow.                            Written discharge instructions were provided to the                             patient.                           - Resume previous diet.                           - Continue present medications.                           - Await pathology results. Remo Lipps P. Bradi [place], MD 01/08/2021 12:52:22 PM This report has been signed electronically.

## 2021-01-08 NOTE — Progress Notes (Signed)
Called to room to assist during endoscopic procedure.  Patient ID and intended procedure confirmed with present staff. Received instructions for my participation in the procedure from the performing physician.  

## 2021-01-08 NOTE — Progress Notes (Signed)
Report to PACU, RN, vss, BBS= Clear.  

## 2021-01-08 NOTE — Patient Instructions (Signed)
Please read handouts provided. Continue present medications. Await pathology results.   YOU HAD AN ENDOSCOPIC PROCEDURE TODAY AT THE [facility]:   Refer to the procedure report that was given to you for any specific questions about what was found during the examination.  If the procedure report does not answer your questions, please call your gastroenterologist to clarify.  If you requested that your care partner not be given the details of your procedure findings, then the procedure report has been included in a sealed envelope for you to review at your convenience later.  YOU SHOULD EXPECT: Some feelings of bloating in the abdomen. Passage of more gas than usual.  Walking can help get rid of the air that was put into your GI tract during the procedure and reduce the bloating. If you had a lower endoscopy (such as a colonoscopy or flexible sigmoidoscopy) you may notice spotting of blood in your stool or on the toilet paper. If you underwent a bowel prep for your procedure, you may not have a normal bowel movement for a few days.  Please Note:  You might notice some irritation and congestion in your nose or some drainage.  This is from the oxygen used during your procedure.  There is no need for concern and it should clear up in a day or so.  SYMPTOMS TO REPORT IMMEDIATELY:  Following lower endoscopy (colonoscopy or flexible sigmoidoscopy):  Excessive amounts of blood in the stool  Significant tenderness or worsening of abdominal pains  Swelling of the abdomen that is new, acute  Fever of 100F or higher   For urgent or emergent issues, a gastroenterologist can be reached at any hour by calling [phone]. Do not use MyChart messaging for urgent concerns.    DIET:  We do recommend a small meal at first, but then you may proceed to your regular diet.  Drink plenty of fluids but you should avoid alcoholic beverages for 24 hours.  ACTIVITY:  You should plan to take it easy  for the rest of today and you should NOT DRIVE or use heavy machinery until tomorrow (because of the sedation medicines used during the test).    FOLLOW UP: Our staff will call the number listed on your records 48-72 hours following your procedure to check on you and address any questions or concerns that you may have regarding the information given to you following your procedure. If we do not reach you, we will leave a message.  We will attempt to reach you two times.  During this call, we will ask if you have developed any symptoms of COVID 19. If you develop any symptoms (ie: fever, flu-like symptoms, shortness of breath, cough etc.) before then, please call [phone].  If you test positive for Covid 19 in the 2 weeks post procedure, please call and report this information to us.    If any biopsies were taken you will be contacted by phone or by letter within the next 1-3 weeks.  Please call us at [phone] if you have not heard about the biopsies in 3 weeks.    SIGNATURES/CONFIDENTIALITY: You and/or your care partner have signed paperwork which will be entered into your electronic medical record.  These signatures attest to the fact that that the information above on your After Visit Summary has been reviewed and is understood.  Full responsibility of the confidentiality of this discharge information lies with you and/or your care-partner.  

## 2021-01-08 NOTE — Progress Notes (Signed)
Pt's states no medical or surgical changes since previsit or office visit. VS assessed by D.T 

## 2021-01-08 NOTE — Progress Notes (Signed)
VS- Tonya Thompson 

## 2021-01-08 NOTE — Progress Notes (Signed)
[facility] History and Physical   Primary Care Physician:  Unk Pinto, MD   Reason for Procedure:   History of colon polyps - 3 adenomas 2017  Plan:    colonoscopy     HPI: Tonya Thompson is a 65 y.o. female  here for colonoscopy surveillance for polyps. Patient denies any bowel symptoms at this time. No family history of colon cancer known. Otherwise feels well without any cardiopulmonary symptoms.    Past Medical History:  Diagnosis Date   Acute kidney injury ([place]) 09/03/2019   05-2019 kidney stone removed   Allergy    seasonal allergies   Arthritis    LEFT hand   AVM (arteriovenous malformation) brain    Cataract    bilateral-not a surgical candidate at this time (07/23/2020)   COVID-19 virus infection 08/2020   GERD (gastroesophageal reflux disease)    on meds   Hyperlipidemia    diet controlled   Insulin resistance 02/16/2018   Seizures ([place])    Grand [place] & Partial complex seizure disorder-last 1998   Stroke [facility])    5 total so far (age [phone])   Ureteral stone 05/10/2019   has multiple kidney stones noted from Urologist   Uses roller walker     Past Surgical History:  Procedure Laterality Date   BRAIN SURGERY  09/19/1978   at [facility], Dr. Leida Lauth   COLONOSCOPY  2017   [place], [place]   CYSTOSCOPY/URETEROSCOPY/HOLMIUM LASER/STENT PLACEMENT Left 05/10/2019   Procedure: CYSTOSCOPY/RETROGRADE/URETEROSCOPY/HOLMIUM LASER/STENT PLACEMENT;  Surgeon: Raynelle Bring, MD;  Location: [facility];  Service: Urology;  Laterality: Left;   FOOT SURGERY Right 10/1985   ankle fusion, at [facility], Dr. Lorelee Cover   POLYPECTOMY  2017   multiple adenomas   TUBAL LIGATION  1981   WISDOM TOOTH EXTRACTION      Prior to Admission medications   Medication Sig Start Date End Date Taking? Authorizing Provider  baclofen (LIORESAL) 10 MG tablet Take 1 tablet up to four times daily as needed Patient taking differently: Take 10 mg by mouth 2 (two) times daily. Take 1 tablet  up to four times daily as needed- she takes 1-2 every evening 09/01/20  Yes Cameron Sprang, MD  divalproex (DEPAKOTE ER) 500 MG 24 hr tablet Take 2 tablets every night 09/01/20  Yes Cameron Sprang, MD  fexofenadine (ALLEGRA) 180 MG tablet Take 180 mg by mouth daily.   Yes [provider]  topiramate (TOPAMAX) 200 MG tablet Take 1 tablet three times a day 09/01/20  Yes Cameron Sprang, MD  butalbital-acetaminophen-caffeine (FIORICET) [phone] MG tablet Take 1 tablet as needed for severe headache. Do not take more than 2-3 a week. Patient not taking: Reported on 01/08/2021 08/11/20   Cameron Sprang, MD  calcium carbonate (TUMS - DOSED IN MG ELEMENTAL CALCIUM) 500 MG chewable tablet Chew 1 tablet by mouth daily. Patient not taking: Reported on 01/08/2021    [provider]  Calcium Citrate-Vitamin D (CALCIUM + D PO) Take 1 tablet by mouth daily.  Patient not taking: Reported on 01/08/2021    [provider]  Multiple Vitamins-Minerals (MULTIVITAMIN WITH MINERALS) tablet Take 1 tablet by mouth daily.  Patient not taking: Reported on 01/08/2021    [provider]  Omega-3 Fatty Acids (FISH OIL PO) Take 1 capsule by mouth daily. Patient not taking: Reported on 01/08/2021    [provider]  POTASSIUM PO Take 1 tablet by mouth daily. Patient not taking: Reported on 01/08/2021  [provider]  Propylene Glycol (SYSTANE BALANCE) 0.6 % SOLN Place 1 drop into both eyes 2 (two) times daily. Patient not taking: Reported on 01/08/2021    [provider]  Vitamin D3 (VITAMIN D) 25 MCG tablet Take 1,000 Units by mouth daily.  Patient not taking: Reported on 01/08/2021    [provider]    Current Outpatient Medications  Medication Sig Dispense Refill   baclofen (LIORESAL) 10 MG tablet Take 1 tablet up to four times daily as needed (Patient taking differently: Take 10 mg by mouth 2 (two) times daily. Take 1 tablet up to four times daily as needed- she  takes 1-2 every evening) 120 each 11   divalproex (DEPAKOTE ER) 500 MG 24 hr tablet Take 2 tablets every night 60 tablet 11   fexofenadine (ALLEGRA) 180 MG tablet Take 180 mg by mouth daily.     topiramate (TOPAMAX) 200 MG tablet Take 1 tablet three times a day 90 tablet 11   butalbital-acetaminophen-caffeine (FIORICET) 50-325-40 MG tablet Take 1 tablet as needed for severe headache. Do not take more than 2-3 a week. (Patient not taking: Reported on 01/08/2021) 10 tablet 5   calcium carbonate (TUMS - DOSED IN MG ELEMENTAL CALCIUM) 500 MG chewable tablet Chew 1 tablet by mouth daily. (Patient not taking: Reported on 01/08/2021)     Calcium Citrate-Vitamin D (CALCIUM + D PO) Take 1 tablet by mouth daily.  (Patient not taking: Reported on 01/08/2021)     Multiple Vitamins-Minerals (MULTIVITAMIN WITH MINERALS) tablet Take 1 tablet by mouth daily.  (Patient not taking: Reported on 01/08/2021)     Omega-3 Fatty Acids (FISH OIL PO) Take 1 capsule by mouth daily. (Patient not taking: Reported on 01/08/2021)     POTASSIUM PO Take 1 tablet by mouth daily. (Patient not taking: Reported on 01/08/2021)     Propylene Glycol (SYSTANE BALANCE) 0.6 % SOLN Place 1 drop into both eyes 2 (two) times daily. (Patient not taking: Reported on 01/08/2021)     Vitamin D3 (VITAMIN D) 25 MCG tablet Take 1,000 Units by mouth daily.  (Patient not taking: Reported on 01/08/2021)     Current Facility-Administered Medications  Medication Dose Route Frequency Provider Last Rate Last Admin   0.9 %  sodium chloride infusion  500 mL Intravenous Once Yetta Flock, MD        Allergies as of 01/08/2021 - Review Complete 01/08/2021  Allergen Reaction Noted   Aspirin Other (See Comments) 04/20/2016   Cheese  05/09/2019   Chocolate  05/09/2019   Coffee flavor  05/09/2019   Other  05/09/2019   Red wine complex [germanium]  05/09/2019    Family History  Problem Relation Age of Onset   Heart disease Mother    Heart failure Mother     Heart attack Mother    Diabetes Father    Hyperlipidemia Father    Dementia Father    Prostate cancer Father    Melanoma Father    Colon polyps Father    Hyperlipidemia Paternal Uncle    Hypertension Paternal Uncle    Dementia Paternal Uncle    Heart attack Maternal Grandfather    Stroke Paternal Grandmother    CAD Maternal Uncle    COPD Maternal Uncle        smoker   Breast cancer Neg Hx    Colon cancer Neg Hx    Esophageal cancer Neg Hx    Stomach cancer Neg Hx    Rectal cancer Neg  Hx     Social History   Socioeconomic History   Marital status: Single    Spouse name: Not on file   Number of children: Not on file   Years of education: Not on file   Highest education level: Not on file  Occupational History   Not on file  Tobacco Use   Smoking status: Never    Passive exposure: Past   Smokeless tobacco: Never  Vaping Use   Vaping Use: Never used  Substance and Sexual Activity   Alcohol use: No   Drug use: No   Sexual activity: Not Currently    Birth control/protection: None    Comment: 1st intercourse 65 yo-Fewer than 5 partners  Other Topics Concern   Not on file  Social History Narrative   Right handed until 3rd grade    Left handed   [facility] independent living    Social Determinants of Health   Financial Resource Strain: Not on file  Food Insecurity: Not on file  Transportation Needs: Not on file  Physical Activity: Not on file  Stress: Not on file  Social Connections: Not on file  Intimate Partner Violence: Not on file    Review of Systems: All other review of systems negative except as mentioned in the HPI.  Physical Exam: Vital signs BP 128/72   Pulse 75   Temp 98.1 F (36.7 C) (Temporal)   Resp 12   Ht 5' 5.5" (1.664 m)   Wt 149 lb 3.2 oz (67.7 kg)   SpO2 98%   BMI 24.45 kg/m   General:   Alert,  Well-developed, pleasant and cooperative in NAD Lungs:  Clear throughout to auscultation.   Heart:  Regular rate and  rhythm Abdomen:  Soft, nontender and nondistended.   Neuro/Psych:  Alert and cooperative. Normal mood and affect. A and O x 3  Jolly Mango, MD [facility]

## 2021-01-12 ENCOUNTER — Telehealth: Payer: Self-pay

## 2021-01-12 NOTE — Telephone Encounter (Signed)
  Follow up Call-  Call back number 01/08/2021  Post procedure Call Back phone  # [phone]  Permission to leave phone message Yes  Some recent data might be hidden     Patient questions:  Do you have a fever, pain , or abdominal swelling? No. Pain Score  0 *  Have you tolerated food without any problems? Yes.    Have you been able to return to your normal activities? Yes.    Do you have any questions about your discharge instructions: Diet   No. Medications  No. Follow up visit  No.  Do you have questions or concerns about your Care? No.  Actions: * If pain score is 4 or above: No action needed, pain <4.  Have you developed a fever since your procedure? no  2.   Have you had an respiratory symptoms (SOB or cough) since your procedure? no  3.   Have you tested positive for COVID 19 since your procedure no  4.   Have you had any family members/close contacts diagnosed with the COVID 19 since your procedure?  no   If yes to any of these questions please route to Joylene John, RN and Joella Prince, RN

## 2021-01-16 DIAGNOSIS — H2513 Age-related nuclear cataract, bilateral: Secondary | ICD-10-CM | POA: Diagnosis not present

## 2021-01-20 DIAGNOSIS — Z23 Encounter for immunization: Secondary | ICD-10-CM | POA: Diagnosis not present

## 2021-01-27 ENCOUNTER — Other Ambulatory Visit: Payer: Self-pay

## 2021-01-27 ENCOUNTER — Encounter: Payer: Self-pay | Admitting: Internal Medicine

## 2021-01-27 ENCOUNTER — Telehealth: Payer: Self-pay

## 2021-01-27 ENCOUNTER — Ambulatory Visit (INDEPENDENT_AMBULATORY_CARE_PROVIDER_SITE_OTHER): Payer: Medicare Other | Admitting: Internal Medicine

## 2021-01-27 VITALS — BP 102/62 | HR 73 | Temp 97.5°F | Ht 65.5 in | Wt 147.0 lb

## 2021-01-27 DIAGNOSIS — R4189 Other symptoms and signs involving cognitive functions and awareness: Secondary | ICD-10-CM | POA: Diagnosis not present

## 2021-01-27 DIAGNOSIS — Z79899 Other long term (current) drug therapy: Secondary | ICD-10-CM | POA: Diagnosis not present

## 2021-01-27 DIAGNOSIS — R300 Dysuria: Secondary | ICD-10-CM

## 2021-01-27 DIAGNOSIS — G40219 Localization-related (focal) (partial) symptomatic epilepsy and epileptic syndromes with complex partial seizures, intractable, without status epilepticus: Secondary | ICD-10-CM | POA: Diagnosis not present

## 2021-01-27 DIAGNOSIS — N1832 Chronic kidney disease, stage 3b: Secondary | ICD-10-CM

## 2021-01-27 DIAGNOSIS — R0989 Other specified symptoms and signs involving the circulatory and respiratory systems: Secondary | ICD-10-CM | POA: Diagnosis not present

## 2021-01-27 NOTE — Telephone Encounter (Signed)
Called and spoke to Tonya Thompson and asked her per Dr. Delice Lesch if they felt patient needed to go to the ER? Tonya Thompson informed me that they do not think patient is medically unstable. I also informed Tonya Thompson that Dr. Delice Lesch has advised that patient should be checked to ensure there is no infection such as a UTI causing all this. Tonya Thompson informed me that patient has an appointment with her PCP at 4:30 pm today and will mention the UA.   Tonya Thompson stated she will call us with any updates on patient after her appt.

## 2021-01-27 NOTE — Telephone Encounter (Signed)
Does she think the patient needs to go to the ER? Would make sure there is no infection such as a UTI causing all this, would contact PCP. Thanks

## 2021-01-27 NOTE — Telephone Encounter (Signed)
Social Worker from residence home called and stated patient has an appointment in November and would like to know if she could be seen sooner. Patient has been experiencing some symptoms of concern:   Drooling right side of face 2. Unusual napping 3. Falls that she doesn't remember  4. Any facial numbness? No 5. Reports having a headache once or twice a week. 6. Blurred vision? No 7. Slurred speech? No 8. Delayed thought process is new. Patient slower at answering questions.  9. Chest Pain? Patient is pointing to her sternum but could not explain what she is feeling.  10. How is her Speech? Slow response. Trying to process what she is saying.  11. How long has all this been going on? Patient stated 2 or 3 months. However, she reports that she noticed symptoms couple days ago.    Informed Twan that I would send message to Dr. Delice Lesch and give them a call back once I hear back.   Contact info provided of who to call back: [place] [phone]

## 2021-01-27 NOTE — Progress Notes (Signed)
Future Appointments  Date Time Provider [place]  03/04/2021 11:30 AM Cameron Sprang, MD [facility] None  04/03/2021  9:30 AM Liane Comber, NP [facility] None  08/13/2021  9:30 AM Liane Comber, NP [facility] None  12/23/2021  9:00 AM Liane Comber, NP [facility] None    History of Present Illness:  [[ Copy: Patient has hx/o seizure disorder (age 65 yo) and hx/o  Brain AVM at age 35 yo with a 1st stroke at age 16 (30) and 3 more strokes by age 63 yo (45) when she had he 1st surg for AVM.  Her last seizure reportedly was circa 1988. Patient follows with  Follows with Dr. Duwayne Heck in [place].  Patient has a residual mild spastic Rt HP.]]     Patient is a very nice 65 yo single WF with HTN, HLD, hx/o Seizure Disorder, Pre-Diabetes and Vitamin D Deficiency who has had increasing dependency needs at [facility] in [place] and is to be admitted tomorrow to the AL or Assisted Living unit at [facility]. Patient is brought in today by her sister [name] visiting from [place] for concern that patient may have had a small stroke yesterday because of possible drooping of her Rt eye lid which sister admits today seems normal. Apparently they called her new Neurologist's office  (Dr Delice Lesch) and was advised to take patient to ER or PCP to be checked also for infection as UTI. Patient possibly has had mild sx's of dysuria. Sister also reports recent lapses in short term recall.    Medications    fexofenadine (ALLEGRA) 180 MG tablet, Take 180 mg by mouth daily.   butalbital-acetaminophen-caffeine (FIORICET) 50-325-40 MG tablet, Take 1 tablet as needed for severe headache. Do not take more than 2-3 a week.   baclofen (LIORESAL) 10 MG tablet, Take 1 tablet up to four times daily as needed (Patient taking differently: Take 10 mg by mouth 2 (two) times daily. Take 1 tablet up to four times daily as needed- she takes 1-2 every evening)   calcium carbonate (TUMS -  DOSED IN MG ELEMENTAL CALCIUM) 500 MG chewable tablet, Chew 1 tablet by mouth daily.   Calcium Citrate-Vitamin D (CALCIUM + D PO), Take 1 tablet by mouth daily.   divalproex (DEPAKOTE ER) 500 MG 24 hr tablet, Take 2 tablets every night   Multiple Vitamins-Minerals (MULTIVITAMIN WITH MINERALS) tablet, Take 1 tablet by mouth daily.   Omega-3 Fatty Acids (FISH OIL PO), Take 1 capsule by mouth daily.   POTASSIUM PO, Take 1 tablet by mouth daily.   Propylene Glycol (SYSTANE BALANCE) 0.6 % SOLN, Place 1 drop into both eyes 2 (two) times daily.   topiramate (TOPAMAX) 200 MG tablet, Take 1 tablet three times a day (Patient taking differently: 300 mg. Take 1 tablet three times a day)   Vitamin D3 (VITAMIN D) 25 MCG tablet, Take 1,000 Units by mouth daily.  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   Observations/Objective:  BP 102/62   Pulse 73   Temp (!) 97.5 F (36.4 C)   Ht 5' 5.5" (1.664 m)   Wt 147 lb (66.7  kg)   SpO2 97%   BMI 24.09 kg/m   HEENT - WNL. Neck - supple.  Chest - Clear equal BS. Cor - Nl HS. RRR w/o sig MGR. PP 1(+). No edema. MS- Mild Rt spastic Hemi-paresis with shuffling gait - sl limping antalgic supported by a Rollator.  Flexion contracturing of the Rt wrist, hand & fingers.   Neuro -  Nl w/o focal abnormalities.  Assessment and Plan:   1. Labile hypertension  - CBC with Differential/Platelet - COMPLETE METABOLIC PANEL WITH GFR - TSH  2. Partial symptomatic epilepsy with complex partial seizures  (HCC)  - Valproic acid level  3. Stage 3b chronic kidney disease (HCC)  - COMPLETE METABOLIC PANEL WITH GFR  4. Cognitive changes  - CBC with Differential/Platelet - COMPLETE METABOLIC PANEL WITH GFR - Valproic acid level  5. Dysuria ,  r/o UTI  - Urinalysis, Routine w reflex microscopic - Urine Culture  6. Medication management  - CBC with Differential/Platelet - COMPLETE METABOLIC PANEL WITH GFR - TSH - Valproic acid level   Follow Up Instructions:        I discussed the assessment and treatment plan with the patient. The patient was provided an opportunity to ask questions and all were answered. The patient agreed with the plan and demonstrated an understanding of the instructions.       Over 30  minutes of counseling,  chart review and critical decision making was performed. The patient was advised to call back or seek an in-person evaluation if the symptoms worsen or if the condition fails to improve as anticipated.    Kirtland Bouchard, MD

## 2021-01-27 NOTE — Patient Instructions (Signed)

## 2021-01-28 ENCOUNTER — Telehealth: Payer: Self-pay | Admitting: Neurology

## 2021-01-28 LAB — COMPLETE METABOLIC PANEL WITH GFR
AG Ratio: 1.3 (calc) (ref 1.0–2.5)
ALT: 22 U/L (ref 6–29)
AST: 26 U/L (ref 10–35)
Albumin: 3.6 g/dL (ref 3.6–5.1)
Alkaline phosphatase (APISO): 87 U/L (ref 37–153)
BUN/Creatinine Ratio: 22 (calc) (ref 6–22)
BUN: 27 mg/dL — ABNORMAL HIGH (ref 7–25)
CO2: 27 mmol/L (ref 20–32)
Calcium: 9 mg/dL (ref 8.6–10.4)
Chloride: 108 mmol/L (ref 98–110)
Creat: 1.23 mg/dL — ABNORMAL HIGH (ref 0.50–1.05)
Globulin: 2.7 g/dL (calc) (ref 1.9–3.7)
Glucose, Bld: 93 mg/dL (ref 65–99)
Potassium: 5.4 mmol/L — ABNORMAL HIGH (ref 3.5–5.3)
Sodium: 142 mmol/L (ref 135–146)
Total Bilirubin: 0.4 mg/dL (ref 0.2–1.2)
Total Protein: 6.3 g/dL (ref 6.1–8.1)
eGFR: 50 mL/min/{1.73_m2} — ABNORMAL LOW (ref >=60)

## 2021-01-28 LAB — TSH: TSH: 3.11 mIU/L (ref 0.40–4.50)

## 2021-01-28 LAB — URINALYSIS, ROUTINE W REFLEX MICROSCOPIC
Bacteria, UA: NONE SEEN /HPF
Bilirubin Urine: NEGATIVE
Glucose, UA: NEGATIVE
Hyaline Cast: NONE SEEN /LPF
Ketones, ur: NEGATIVE
Nitrite: NEGATIVE
Specific Gravity, Urine: 1.009 (ref 1.001–1.035)
Squamous Epithelial / HPF: NONE SEEN /HPF (ref <=5)
pH: 7 (ref 5.0–8.0)

## 2021-01-28 LAB — URINE CULTURE
MICRO NUMBER:: 12428970
SPECIMEN QUALITY:: ADEQUATE

## 2021-01-28 LAB — CBC WITH DIFFERENTIAL/PLATELET
Absolute Monocytes: 707 cells/uL (ref 200–950)
Basophils Absolute: 31 cells/uL (ref 0–200)
Basophils Relative: 0.5 %
Eosinophils Absolute: 99 cells/uL (ref 15–500)
Eosinophils Relative: 1.6 %
HCT: 42.3 % (ref 35.0–45.0)
Hemoglobin: 14 g/dL (ref 11.7–15.5)
Lymphs Abs: 2046 cells/uL (ref 850–3900)
MCH: 33.3 pg — ABNORMAL HIGH (ref 27.0–33.0)
MCHC: 33.1 g/dL (ref 32.0–36.0)
MCV: 100.7 fL — ABNORMAL HIGH (ref 80.0–100.0)
MPV: 11 fL (ref 7.5–12.5)
Monocytes Relative: 11.4 %
Neutro Abs: 3317 cells/uL (ref 1500–7800)
Neutrophils Relative %: 53.5 %
Platelets: 176 10*3/uL (ref 140–400)
RBC: 4.2 10*6/uL (ref 3.80–5.10)
RDW: 12.5 % (ref 11.0–15.0)
Total Lymphocyte: 33 %
WBC: 6.2 10*3/uL (ref 3.8–10.8)

## 2021-01-28 LAB — VALPROIC ACID LEVEL: Valproic Acid Lvl: 89.1 mg/L (ref 50.0–100.0)

## 2021-01-28 LAB — MICROSCOPIC MESSAGE

## 2021-01-28 NOTE — Telephone Encounter (Signed)
[name] informed [facility] She has been on Topamax 200mg  1.5 tabs (300mg ) BID for many years before she saw Dr Delice Lesch, continue same dose

## 2021-01-28 NOTE — Telephone Encounter (Signed)
[facility] from [facility]  [phone] ext [phone] needs to talk to someone today about Patient medication Topamax. She states that the patient is taking 200 mg of Topamax 3 times a day and the Pharmacists  said that the Daily limit is 400 mg.   Please call today the patient is being admitted to their care today

## 2021-01-28 NOTE — Telephone Encounter (Signed)
She has been on Topamax 200mg  1.5 tabs (300mg ) BID for many years before she saw me, continue same dose.

## 2021-01-28 NOTE — Progress Notes (Signed)
============================================================ -   Test results slightly outside the reference range are not unusual. If there is anything important, I will review this with you,  otherwise it is considered normal test values.  If you have further questions,  please do not hesitate to contact me at the office or via My Chart.  ============================================================ ============================================================  -  Kidney functions still Stage 3a  & Stable     Very important to drink adequate amounts of fluids to prevent permanent damage    - Recommend drink at least 6 bottles (16 ounces) of fluids /water /day = 96 Oz ~100 oz  - 100 oz = 3,000 cc or 3 liters / day  - >> That's 1 &1/2 bottles of a 2 liter soda bottle /day !  ============================================================ ============================================================  -  Valproic level  (= Depakote) level is good level - please keep dose same  ============================================================ ============================================================  -  All Else - CBC -  Electrolytes - Liver - Magnesium & Thyroid    - all  Normal / OK ============================================================ ============================================================

## 2021-01-29 NOTE — Progress Notes (Signed)
============================================================ ============================================================  -    Urine Culture returned - OK - No Infection   ============================================================ ============================================================

## 2021-01-30 DIAGNOSIS — R2681 Unsteadiness on feet: Secondary | ICD-10-CM | POA: Diagnosis not present

## 2021-01-30 DIAGNOSIS — R2689 Other abnormalities of gait and mobility: Secondary | ICD-10-CM | POA: Diagnosis not present

## 2021-01-30 DIAGNOSIS — I69951 Hemiplegia and hemiparesis following unspecified cerebrovascular disease affecting right dominant side: Secondary | ICD-10-CM | POA: Diagnosis not present

## 2021-01-30 DIAGNOSIS — G40219 Localization-related (focal) (partial) symptomatic epilepsy and epileptic syndromes with complex partial seizures, intractable, without status epilepticus: Secondary | ICD-10-CM | POA: Diagnosis not present

## 2021-02-19 ENCOUNTER — Non-Acute Institutional Stay: Payer: Medicare Other | Admitting: Internal Medicine

## 2021-02-19 DIAGNOSIS — N1831 Chronic kidney disease, stage 3a: Secondary | ICD-10-CM

## 2021-02-19 DIAGNOSIS — R051 Acute cough: Secondary | ICD-10-CM | POA: Diagnosis not present

## 2021-02-19 DIAGNOSIS — Z8673 Personal history of transient ischemic attack (TIA), and cerebral infarction without residual deficits: Secondary | ICD-10-CM | POA: Diagnosis not present

## 2021-02-19 DIAGNOSIS — G40219 Localization-related (focal) (partial) symptomatic epilepsy and epileptic syndromes with complex partial seizures, intractable, without status epilepticus: Secondary | ICD-10-CM

## 2021-02-19 DIAGNOSIS — R911 Solitary pulmonary nodule: Secondary | ICD-10-CM

## 2021-02-19 DIAGNOSIS — R0989 Other specified symptoms and signs involving the circulatory and respiratory systems: Secondary | ICD-10-CM | POA: Diagnosis not present

## 2021-02-20 ENCOUNTER — Encounter: Payer: Self-pay | Admitting: Internal Medicine

## 2021-02-20 NOTE — Progress Notes (Signed)
Provider:  Veleta Miners MD Location:   [place] Room Number: 31 Place of Service:  ALF (13)  PCP: Unk Pinto, MD Patient Care Team: Unk Pinto, MD as PCP - General (Internal Medicine) Cameron Sprang, MD as Consulting Physician (Neurology) Corliss Parish, MD as Consulting Physician (Nephrology) Raynelle Bring, MD as Consulting Physician (Urology)  Extended Emergency Contact Information Primary Emergency Contact: Barnie Del [place] of [place] Phone: [phone] Mobile Phone: [phone] Relation: Relative Secondary Emergency Contact: [place] Phone: [phone] Mobile Phone: [phone] Relation: Sister  Code Status: Full Code Goals of Care: Advanced Directive information Advanced Directives 09/01/2020  Does Patient Have a Medical Advance Directive? Yes  Type of Advance Directive Living will;Healthcare Power of [place];Out of facility DNR (pink MOST or yellow form)  Does patient want to make changes to medical advance directive? -  Copy of [place] in Chart? -      Chief Complaint  Patient presents with   New Admit To SNF    Admission to AL    HPI: Patient is a 65 y.o. female seen today for admission to AL  She has h/o  Seizures from age 42 with 3 strokes by age 51 due to [place] Had first surgery for AVMS when she was 22  No Seizures since 1998.  H/o Migraines and Headaches H/o Strokes With Right Spasticity Labile Hypertension H/o Lung Nodule Repeat CT scan of chest pending  Was seen in her room today as she has been having Cough and Runny nose H/o Covid Exposure Was checked for Covid Twice and is negative so far  No Other complains today Staying mostly in her room    Past Medical History:  Diagnosis Date   Acute kidney injury ([place]) 09/03/2019   05-2019 kidney stone removed   Allergy    seasonal allergies   Arthritis    LEFT hand   AVM (arteriovenous malformation)  brain    Cataract    bilateral-not a surgical candidate at this time (07/23/2020)   COVID-19 virus infection 08/2020   GERD (gastroesophageal reflux disease)    on meds   Hyperlipidemia    diet controlled   Insulin resistance 02/16/2018   Seizures ([place])    [place] & Partial complex seizure disorder-last 1998   Stroke [facility])    5 total so far (age [id number])   Ureteral stone 05/10/2019   has multiple kidney stones noted from Urologist   Uses roller walker    Past Surgical History:  Procedure Laterality Date   BRAIN SURGERY  09/19/1978   at [facility], Dr. Leida Lauth   COLONOSCOPY  2017   [place], [place]   CYSTOSCOPY/URETEROSCOPY/HOLMIUM LASER/STENT PLACEMENT Left 05/10/2019   Procedure: CYSTOSCOPY/RETROGRADE/URETEROSCOPY/HOLMIUM LASER/STENT PLACEMENT;  Surgeon: Raynelle Bring, MD;  Location: [facility];  Service: Urology;  Laterality: Left;   FOOT SURGERY Right 10/1985   ankle fusion, at [facility], Dr. Lorelee Cover   POLYPECTOMY  2017   multiple adenomas   TUBAL LIGATION  1981   WISDOM TOOTH EXTRACTION      reports that she has never smoked. She has been exposed to tobacco smoke. She has never used smokeless tobacco. She reports that she does not drink alcohol and does not use drugs. Social History   Socioeconomic History   Marital status: Single    Spouse name: Not on file   Number of children: Not on file   Years of education: Not on file   Highest education level: Not on file  Occupational History   Not on file  Tobacco Use   Smoking status: Never    Passive exposure: Past   Smokeless tobacco: Never  Vaping Use   Vaping Use: Never used  Substance and Sexual Activity   Alcohol use: No   Drug use: No   Sexual activity: Not Currently    Birth control/protection: None    Comment: 1st intercourse 65 yo-Fewer than 5 partners  Other Topics Concern   Not on file  Social History Narrative   Right handed until 3rd grade    Left handed   Lives a friends home [address] independent living     Social Determinants of Health   Financial Resource Strain: Not on file  Food Insecurity: Not on file  Transportation Needs: Not on file  Physical Activity: Not on file  Stress: Not on file  Social Connections: Not on file  Intimate Partner Violence: Not on file    Functional Status Survey:    Family History  Problem Relation Age of Onset   Heart disease Mother    Heart failure Mother    Heart attack Mother    Diabetes Father    Hyperlipidemia Father    Dementia Father    Prostate cancer Father    Melanoma Father    Colon polyps Father    Hyperlipidemia Paternal Uncle    Hypertension Paternal Uncle    Dementia Paternal Uncle    Heart attack Maternal Grandfather    Stroke Paternal Grandmother    CAD Maternal Uncle    COPD Maternal Uncle        smoker   Breast cancer Neg Hx    Colon cancer Neg Hx    Esophageal cancer Neg Hx    Stomach cancer Neg Hx    Rectal cancer Neg Hx     Health Maintenance  Topic Date Due   Zoster Vaccines- Shingrix (1 of 2) Never done   INFLUENZA VACCINE  12/01/2020   PAP SMEAR-Modifier  12/06/2020   COVID-19 Vaccine (5 - Booster for Pfizer series) 01/05/2021   Pneumococcal Vaccine 63-54 Years old (1 - PCV) 12/20/2023 (Originally 01/31/1962)   MAMMOGRAM  08/28/2022   COLONOSCOPY (Pts 45-61yrs Insurance coverage will need to be confirmed)  01/09/2024   TETANUS/TDAP  10/18/2029   HPV VACCINES  Aged Out   Hepatitis C Screening  Discontinued   HIV Screening  Discontinued    Allergies  Allergen Reactions   Aspirin Other (See Comments)    CONTRAINDICATED DUE TO HISTORY OF BLEEDING IN BRAIN   Cheese     Cant take due to history of severe migraines   Chocolate     Cant take due to history of severe migraines   Coffee Flavor     Cant take due to history of severe migraines   Other      Nuts - Cant take due to history of severe migraines   Red Wine Complex [Germanium]     Cant take due to history of severe migraines    Allergies as  of 02/19/2021       Reactions   Aspirin Other (See Comments)   CONTRAINDICATED DUE TO HISTORY OF BLEEDING IN BRAIN   Cheese    Cant take due to history of severe migraines   Chocolate    Cant take due to history of severe migraines   Coffee Flavor    Cant take due to history of severe migraines   Other     Nuts - Cant  take due to history of severe migraines   Red Wine Complex [germanium]    Cant take due to history of severe migraines        Medication List        Accurate as of February 19, 2021 11:59 PM. If you have any questions, ask your nurse or doctor.          STOP taking these medications    butalbital-acetaminophen-caffeine 50-325-40 MG tablet Commonly known as: FIORICET   POTASSIUM PO       TAKE these medications    acetaminophen 325 MG tablet Commonly known as: TYLENOL Take 650 mg by mouth every 4 (four) hours as needed.   baclofen 10 MG tablet Commonly known as: LIORESAL Take 1 tablet up to four times daily as needed What changed:  how much to take how to take this when to take this additional instructions   CALCIUM + D PO Take 1 tablet by mouth daily.   calcium carbonate 500 MG chewable tablet Commonly known as: TUMS - dosed in mg elemental calcium Chew 1 tablet by mouth daily.   divalproex 500 MG 24 hr tablet Commonly known as: DEPAKOTE ER Take 2 tablets every night   fexofenadine 180 MG tablet Commonly known as: ALLEGRA Take 180 mg by mouth daily.   FISH OIL PO Take 1 capsule by mouth daily.   guaiFENesin 600 MG 12 hr tablet Commonly known as: MUCINEX Take 600 mg by mouth 2 (two) times daily as needed.   multivitamin with minerals tablet Take 1 tablet by mouth daily.   Systane Balance 0.6 % Soln Generic drug: Propylene Glycol Place 1 drop into both eyes 2 (two) times daily.   topiramate 200 MG tablet Commonly known as: TOPAMAX Take 300 mg by mouth 2 (two) times daily. What changed: Another medication with the same  name was removed. Continue taking this medication, and follow the directions you see here.   Vitamin D3 25 MCG tablet Commonly known as: Vitamin D Take 1,000 Units by mouth daily.   zinc oxide 20 % ointment Apply 1 application topically as needed for irritation.        Review of Systems  Constitutional:  Positive for activity change and appetite change.  HENT:  Positive for congestion.   Respiratory:  Positive for cough. Negative for shortness of breath.   Cardiovascular: Negative.   Gastrointestinal: Negative.   Genitourinary: Negative.   Musculoskeletal:  Positive for gait problem.  Skin: Negative.   Neurological:  Positive for weakness.  Psychiatric/Behavioral: Negative.     Vitals:   02/20/21 0919  BP: 116/76  Pulse: 70  Resp: 20  Temp: 99.3 F (37.4 C)  SpO2: 96%  Weight: 146 lb 12.8 oz (66.6 kg)  Height: 5\' 5"  (1.651 m)   Body mass index is 24.43 kg/m. Physical Exam Vitals reviewed.  Constitutional:      Appearance: Normal appearance.  HENT:     Head: Normocephalic.     Nose: Nose normal.     Mouth/Throat:     Mouth: Mucous membranes are moist.     Pharynx: Oropharynx is clear.  Eyes:     Pupils: Pupils are equal, round, and reactive to light.  Cardiovascular:     Rate and Rhythm: Normal rate and regular rhythm.     Pulses: Normal pulses.  Pulmonary:     Effort: Pulmonary effort is normal. No respiratory distress.     Breath sounds: Normal breath sounds. No wheezing.  Abdominal:  General: Abdomen is flat. Bowel sounds are normal.     Palpations: Abdomen is soft.  Musculoskeletal:        General: No swelling.     Cervical back: Neck supple.  Skin:    General: Skin is warm.  Neurological:     Mental Status: She is alert.     Comments: Right Spastic Paresis  Psychiatric:        Mood and Affect: Mood normal.        Thought Content: Thought content normal.    Labs reviewed: Basic Metabolic Panel: Recent Labs    06/26/20 1001  07/24/20 0929 07/28/20 1258 12/19/20 1002 01/27/21 1731  NA 142   < > 137 141 142  K 3.9   < > 3.7 3.8 5.4*  CL 108   < > 107 109 108  CO2 28   < > 22 26 27   GLUCOSE 79   < > 125* 79 93  BUN 31*   < > 27* 26* 27*  CREATININE 1.38*   < > 1.20* 1.12* 1.23*  CALCIUM 10.2   < > 9.4 8.8 9.0  MG 2.1  --   --  2.0  --    < > = values in this interval not displayed.   Liver Function Tests: Recent Labs    07/28/20 1258 12/19/20 1002 01/27/21 1731  AST 20 19 26   ALT 28 22 22   ALKPHOS 115  --   --   BILITOT 0.8 0.4 0.4  PROT 7.5 6.3 6.3  ALBUMIN 3.9  --   --    No results for input(s): LIPASE, AMYLASE in the last 8760 hours. No results for input(s): AMMONIA in the last 8760 hours. CBC: Recent Labs    07/28/20 1258 12/19/20 1002 01/27/21 1731  WBC 9.3 4.7 6.2  NEUTROABS 6.7 2,585 3,317  HGB 14.1 13.1 14.0  HCT 42.3 40.4 42.3  MCV 94.2 101.0* 100.7*  PLT 281 204 176   Cardiac Enzymes: No results for input(s): CKTOTAL, CKMB, CKMBINDEX, TROPONINI in the last 8760 hours. BNP: Invalid input(s): POCBNP Lab Results  Component Value Date   HGBA1C 4.8 12/19/2020   Lab Results  Component Value Date   TSH 3.11 01/27/2021   Lab Results  Component Value Date   FEOFHQRF75 883 11/10/2017   No results found for: FOLATE Lab Results  Component Value Date   IRON 78 03/01/2019   TIBC 267 03/01/2019   FERRITIN 192 03/01/2019    Imaging and Procedures obtained prior to SNF admission: MM 3D SCREEN BREAST BILATERAL  Result Date: 08/27/2020 CLINICAL DATA:  Screening. EXAM: DIGITAL SCREENING BILATERAL MAMMOGRAM WITH TOMOSYNTHESIS AND CAD TECHNIQUE: Bilateral screening digital craniocaudal and mediolateral oblique mammograms were obtained. Bilateral screening digital breast tomosynthesis was performed. The images were evaluated with computer-aided detection. COMPARISON:  Previous exam(s). ACR Breast Density Category c: The breast tissue is heterogeneously dense, which may obscure  small masses. FINDINGS: There are no findings suspicious for malignancy. The images were evaluated with computer-aided detection. IMPRESSION: No mammographic evidence of malignancy. A result letter of this screening mammogram will be mailed directly to the patient. RECOMMENDATION: Screening mammogram in one year. (Code:SM-B-01Y) BI-RADS CATEGORY  1: Negative. Electronically Signed   By: Lovey Newcomer M.D.   On: 08/27/2020 16:25    Assessment/Plan Acute cough Covid Negative Most likely viral Continue Mucinex for now Partial symptomatic epilepsy with complex partial seizures, intractable, without status epilepticus ([place]) On Topamax and Depakote Follows with Neurology H/O: CVA (cerebrovascular  accident)with Right Spatic Paresis On Baclofen Working with therapy Labile hypertension Off all meds Continue to follow Stage 3a chronic kidney disease ([place]) Creat is stable Lung nodule seen on imaging study Follow up CT scan schedule per her PCP   Family/ staff Communication:   Labs/tests ordered:

## 2021-02-23 ENCOUNTER — Other Ambulatory Visit: Payer: Self-pay

## 2021-02-23 ENCOUNTER — Ambulatory Visit
Admission: RE | Admit: 2021-02-23 | Discharge: 2021-02-23 | Disposition: A | Discharge status: Home / Self Care | Payer: Medicare Other | Source: Ambulatory Visit | Attending: Adult Health | Admitting: Adult Health

## 2021-02-23 DIAGNOSIS — R918 Other nonspecific abnormal finding of lung field: Secondary | ICD-10-CM

## 2021-02-23 DIAGNOSIS — R911 Solitary pulmonary nodule: Secondary | ICD-10-CM | POA: Diagnosis not present

## 2021-02-23 DIAGNOSIS — I700 Atherosclerosis of aorta: Secondary | ICD-10-CM | POA: Diagnosis not present

## 2021-02-23 IMAGING — CT CT CHEST W/O CM
1 series · 15 of 34 positions shown, 19 images · non-contrast
Comparison: [DATE].

CLINICAL DATA: Lung nodule.

EXAM:
CT CHEST WITHOUT CONTRAST
TECHNIQUE: Multidetector CT imaging of the chest was performed following the
standard protocol without IV contrast.

[Series 2: chest w/(date) · axial · 0.68mm/px · z∈[-271,-3]mm · 15 of 158 slices shown, 19 images]
[im 12/158  mediastinal]
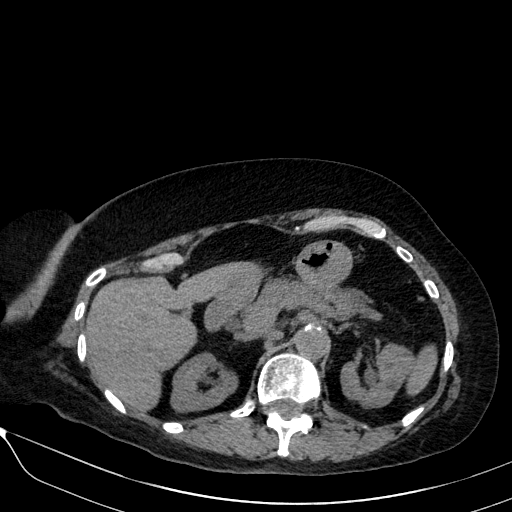
[im 12/158  lung]
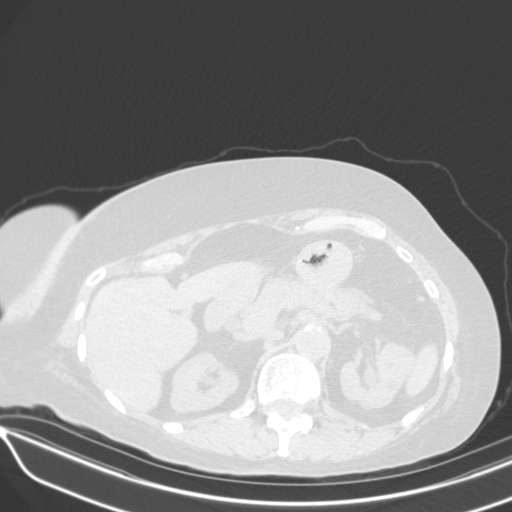
[im 24/158  lung]
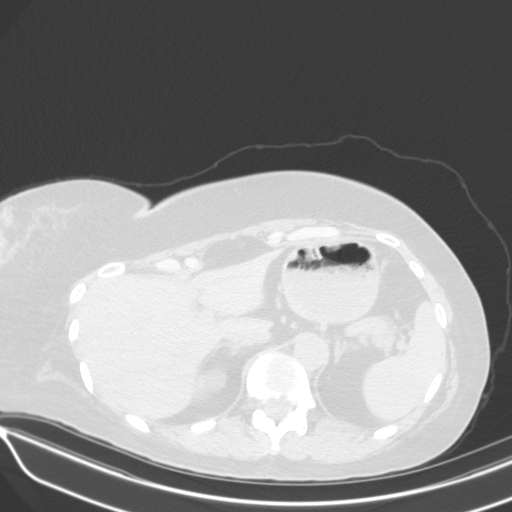
[im 32/158  lung]
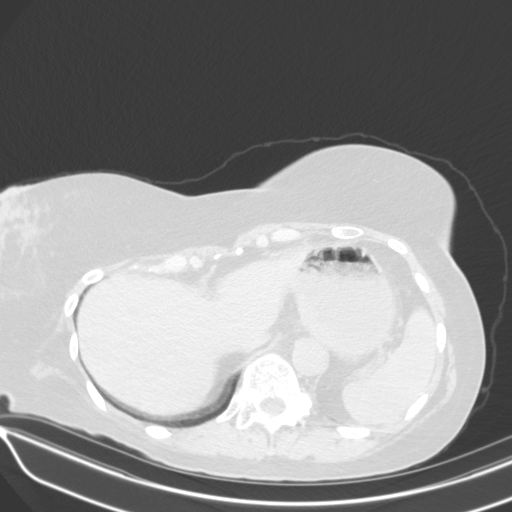
[im 41/158  lung]
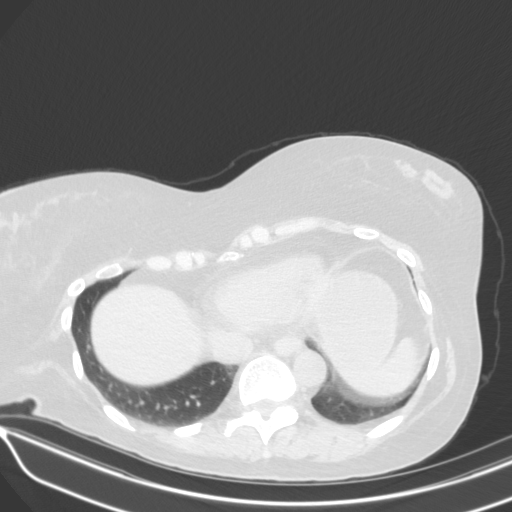
[im 53/158  mediastinal]
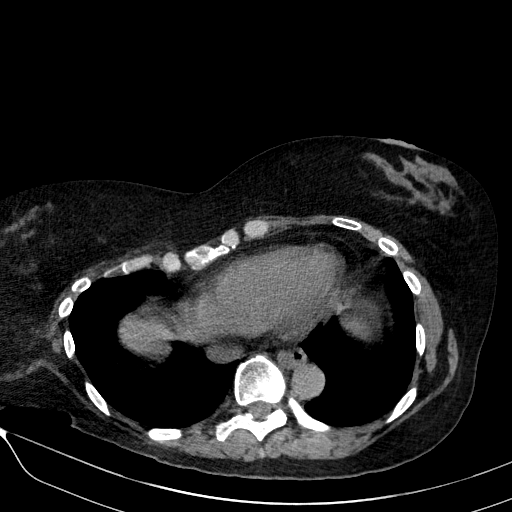
[im 53/158  lung]
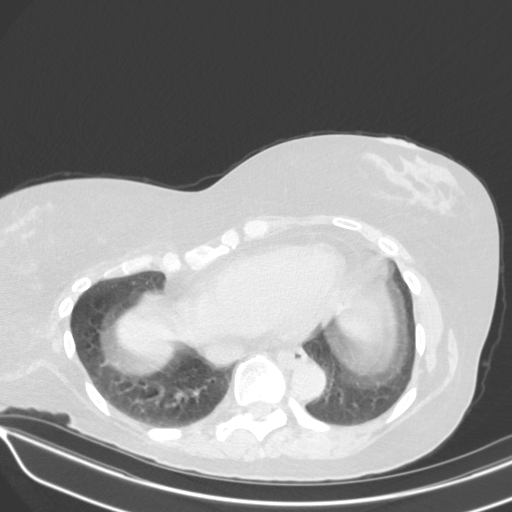
[im 63/158  lung]
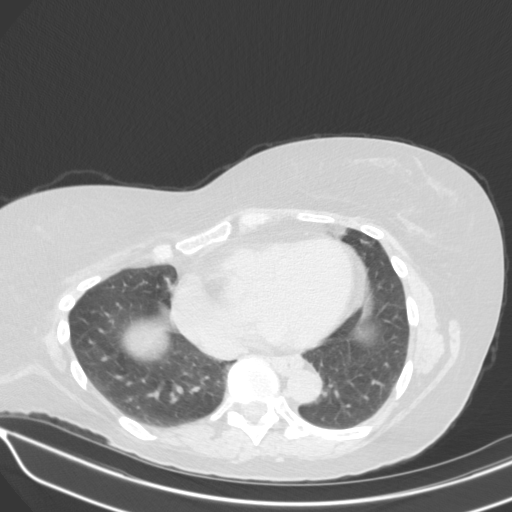
[im 70/158  lung]
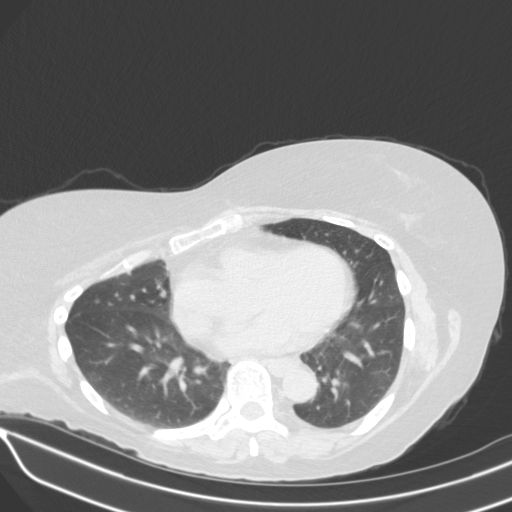
[im 82/158  lung]
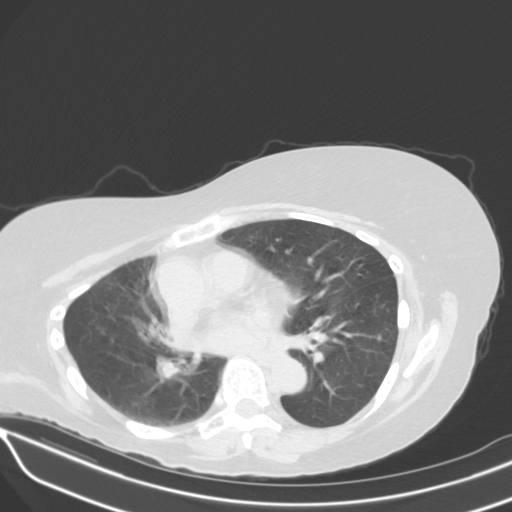
[im 88/158  mediastinal]
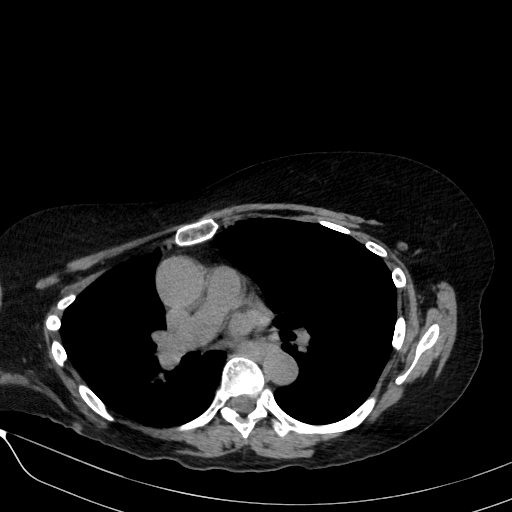
[im 88/158  lung]
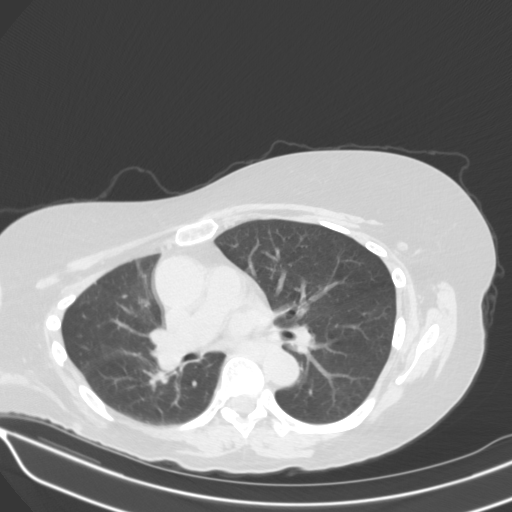
[im 95/158  lung]
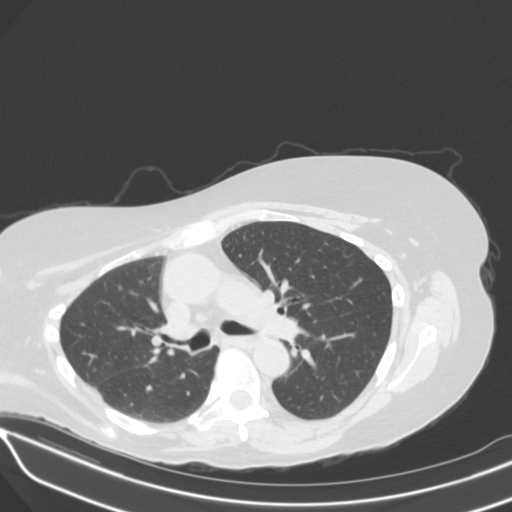
[im 105/158  lung]
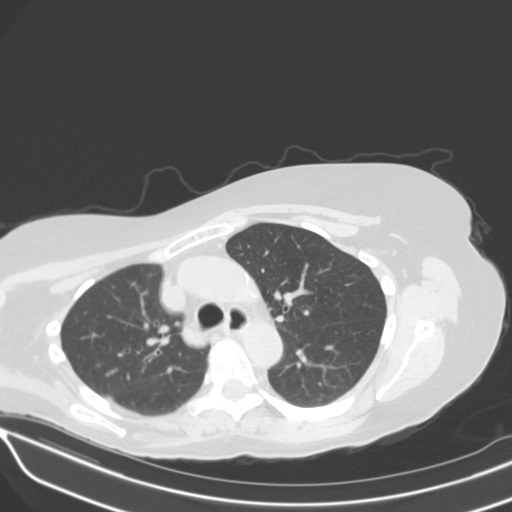
[im 117/158  lung]
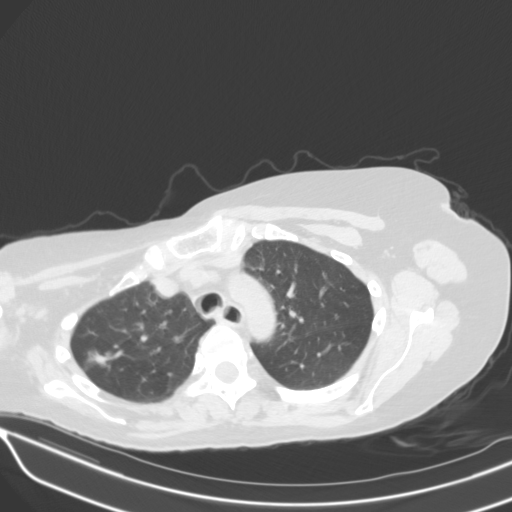
[im 126/158  mediastinal]
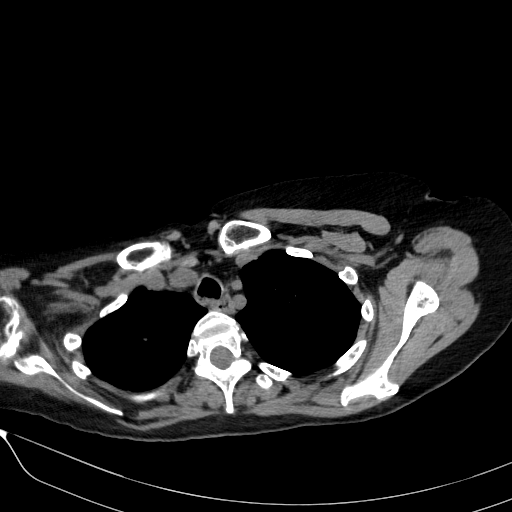
[im 126/158  lung]
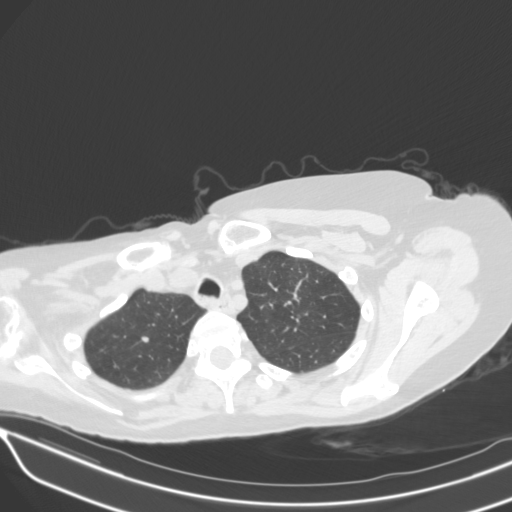
[im 134/158  lung]
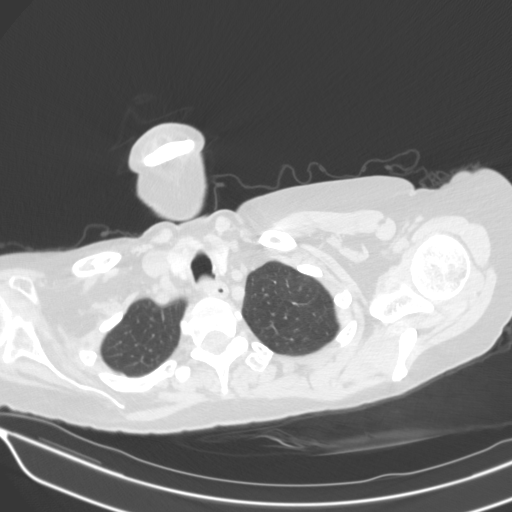
[im 146/158  lung]
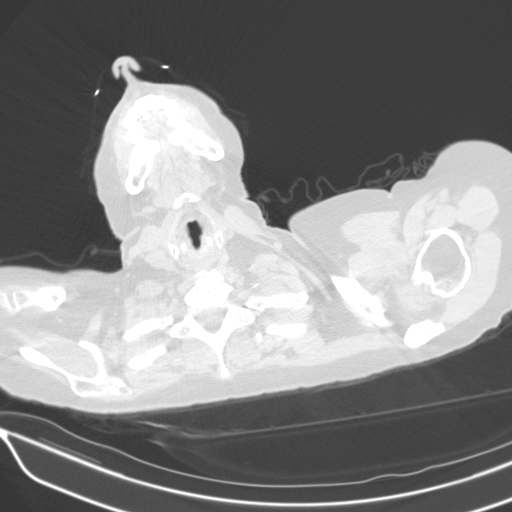

[15 of 34 positions shown; findings below may reference images not displayed]

FINDINGS: Cardiovascular: Atherosclerosis of thoracic aorta is noted without
aneurysm formation. Normal cardiac size. No pericardial effusion.

Mediastinum/Nodes: No enlarged mediastinal or axillary lymph nodes.
Thyroid gland, trachea, and esophagus demonstrate no significant
findings.

Lungs/Pleura: No pneumothorax or pleural effusion is noted. Minimal
biapical scarring is again noted. Stable subpleural nodule is seen
anteriorly in left lower lobe adjacent to major fissure best seen on
image number 66 of series 3. Grossly stable part solid nodule is
again noted in right upper lobe best seen on image number 85 of
series 5. It measures approximately 15 mm in diameter with solid
nodule measuring approximately 6 mm.

Upper Abdomen: Bilateral nephrolithiasis is noted.

Musculoskeletal: No chest wall mass or suspicious bone lesions
identified.
IMPRESSION: Grossly stable part solid nodule is noted in right upper lobe with
solid component that measures approximately 6 mm. Overall, the
abnormality measures approximately 15 mm. Adenocarcinoma is
considered. Thoracic surgery consultation is recommended. These
results will be called to the ordering clinician or representative
by the Radiologist Assistant, and communication documented in the
PACS or zVision Dashboard.

These recommendations are taken from:

Recommendations for the Management of Subsolid Pulmonary Nodules
Detected at CT: A Statement from the [HOSPITAL]

Stable subpleural nodule is seen involving anterior portion of left
lower lobe adjacent to major fissure most consistent with
intrapulmonary lymph node.

Bilateral nephrolithiasis.

Aortic Atherosclerosis ([Y5]-[Y5]).

## 2021-02-24 ENCOUNTER — Other Ambulatory Visit: Payer: Self-pay | Admitting: Nurse Practitioner

## 2021-02-24 DIAGNOSIS — R911 Solitary pulmonary nodule: Secondary | ICD-10-CM

## 2021-02-26 ENCOUNTER — Telehealth: Payer: Self-pay | Admitting: Pulmonary Disease

## 2021-02-26 NOTE — Telephone Encounter (Signed)
Per staff message from Dr. Valeta Harms, "Mel Almond,   Please schedule appt with me, next available new consult or nodule slot would be appropriate.   Thanks   BLI "  Left voicemail yesterday and today for patient to call back to schedule consult for nodule.

## 2021-03-04 ENCOUNTER — Other Ambulatory Visit: Payer: Self-pay

## 2021-03-04 ENCOUNTER — Ambulatory Visit (INDEPENDENT_AMBULATORY_CARE_PROVIDER_SITE_OTHER): Payer: Medicare Other | Admitting: Neurology

## 2021-03-04 ENCOUNTER — Encounter: Payer: Self-pay | Admitting: Neurology

## 2021-03-04 ENCOUNTER — Telehealth: Payer: Self-pay | Admitting: Neurology

## 2021-03-04 VITALS — BP 130/84 | HR 73 | Ht 65.0 in | Wt 149.8 lb

## 2021-03-04 DIAGNOSIS — G43009 Migraine without aura, not intractable, without status migrainosus: Secondary | ICD-10-CM

## 2021-03-04 DIAGNOSIS — Z8774 Personal history of (corrected) congenital malformations of heart and circulatory system: Secondary | ICD-10-CM

## 2021-03-04 DIAGNOSIS — G8191 Hemiplegia, unspecified affecting right dominant side: Secondary | ICD-10-CM | POA: Diagnosis not present

## 2021-03-04 DIAGNOSIS — G40209 Localization-related (focal) (partial) symptomatic epilepsy and epileptic syndromes with complex partial seizures, not intractable, without status epilepticus: Secondary | ICD-10-CM | POA: Diagnosis not present

## 2021-03-04 NOTE — Progress Notes (Signed)
NEUROLOGY FOLLOW UP OFFICE NOTE  Tonya Thompson [id number] [birth year]  HISTORY OF PRESENT ILLNESS: I had the pleasure of seeing Tonya Thompson in follow-up in the neurology clinic on 03/04/2021.  The patient was last seen 6 months ago for seizures (well-controlled) and migraines. She is alone in the office today. Records and images were personally reviewed where available.  Since her last visit, our office received a call from her social worker on 01/27/21 that she was having drooling on the right side of her face, unusual napping, falls that she does not remember, headaches 1-2 times a week, delayed thought process,slower at answering questions. She was pointing to her sternum but could not explain what she was feeling. She was seen by her PCP and had a normal CBC, CMP, TSH, UA, Depakote level 89.1. She is somewhat slower to respond to questions today, speech is fluent however there is some delay in responses, which is different from prior visits. She states she is tired from 2 therapy sessions this morning. She tells me she had a mild stroke in 01/2021, she just remembers drooling but did not go to the hospital for brain imaging. She points to her right side where she has hemiparesis from old stroke, it is unclear if she had worsening symptoms. She was told she needs to move to Assisted Living, which she is still getting used to. Staff administers medications. She had initially presented due to status migrainosus in 07/2020 and Depakote ER $RemoveBef'1000mg'HhSpguLsNe$  qhs was added to her medications. She states the headaches are okay, she just has them every once in a while. She is upset her Fioricet was stopped, she was taking it for "pressure points" on her left frontal region, she says the Tylenol does not help. She has been on Baclofen qhs for spasticity and also is upset that it is ordered prn. She has not had any seizures since 1998 on chronic therapy with Topiramate $RemoveBeforeD'300mg'mkzIroagRlTqZB$  BID, no side effects. Sleep is okay, there are a  few nights she wakes up a couple of times. She denies any falls. She states she cannot take aspirin or a blood thinner according to her prior neurologist.    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   1. Seizures. She had a hemorrhagic stroke at age 65, then had subsequent strokes after, always affecting the right side, last was in 1984. She has been on Topiramate $RemoveBefor'300mg'JleEyfoaBzDV$  BID for many years, switched to generic in 1999. Her last seizure was in 1998. She was last seen by Dr. Adolphus Birchwood at the [facility] in 06/2018. Per notes, she has abnormal EEGs with left hemisphere slowing, maximal left temporal central area with epileptiform activity. She denies any side effects on Topiramate. She denies any staring/unresponsive episodes, gaps in time, olfactory/gustatory hallucinations, myoclonic jerks. She had a normal birth and early development.  There is no history of febrile convulsions, CNS infections such as meningitis/encephalitis, or family history of seizures.  2. Status migrainosus. She has a history of migraines with prn Fioricet for rescue. She reports being migraine-free since 2017, however at that time she was also in status migrainosus with migraines that lasted 8 days. She recalls taking a 1-week prednisone course with Dr. Marlou Sa with resolution of migraines. This headache started 2 weeks ago, she reports  10/10 throbbing pain in the frontal regions, with tenderness to palpation. She has been nauseated, no vomiting. No visual obscurations. She is sensitive to lights. She was prescribed Prednisone 20mg  2 tab daily for 3 days, then 1 tab daily for 4 days. She is saying she was only taking 1 tablet daily and took it for only 3-4 days because it was not working.  Headaches progressively worsened that she has been to the ER 3 times in the past week. In the ER, she has been given IV Reglan/Benadryl, IV Fentanyl, PO Decadron through her PCP, Vicodin from the ER, and most recently IV Depacon 1000mg  and magnesium 2 days ago. She had a sample of Nurtec yesterday from PCP office which only made her sleep 2 hours but did not help with headache. She had a CTA head and neck on 07/24/20 which did not show any acute changes. There was a chronic left MCA infarct with small areas of calcific density within the infarct. There was a cluster of vessels in the left sylvian fissure compatible with residual AVM, appears to drain into the vein of [place]. ESR was normal (11). She states she stopped the Dexamethasone for a few days but took it again this morning. She states that she had done well with migraines by staying away from food triggers, she thinks there may have been something in her food that set this off. She manages her medications and denies missing doses. She has some soreness in her neck. She takes Baclofen for leg spasticity. She used to get 8 hours of sleep, but since headaches recurred, she only gets 5 hours.     PAST MEDICAL HISTORY: Past Medical History:  Diagnosis Date   Acute kidney injury ([place]) 09/03/2019   05-2019 kidney stone removed   Allergy    seasonal allergies   Arthritis    LEFT hand   AVM (arteriovenous malformation) brain    Cataract    bilateral-not a surgical candidate at this time (07/23/2020)   COVID-19 virus infection 08/2020   GERD (gastroesophageal reflux disease)    on meds   Hyperlipidemia    diet controlled   Insulin resistance 02/16/2018   Seizures ([place])    Grand Mal & Partial complex seizure disorder-last 1998   Stroke [facility])    5 total so far (age [phone])   Ureteral stone 05/10/2019   has multiple kidney stones noted from Urologist   Uses roller walker     MEDICATIONS: Current Outpatient Medications on File Prior to  Visit  Medication Sig Dispense Refill   acetaminophen (TYLENOL) 325 MG tablet Take 650 mg by mouth every 4 (four) hours as needed.     baclofen (LIORESAL) 10 MG tablet Take 1 tablet up to four times daily as needed 120 each 11   calcium carbonate (TUMS - DOSED IN MG ELEMENTAL CALCIUM) 500 MG chewable tablet Chew 1 tablet by mouth daily.     Calcium Citrate-Vitamin D (CALCIUM + D PO) Take 1 tablet by mouth daily.     divalproex (DEPAKOTE ER) 500 MG 24 hr tablet Take 2 tablets every night 60 tablet 11   fexofenadine (ALLEGRA) 180 MG tablet Take 180 mg by mouth daily.     guaiFENesin (MUCINEX) 600 MG 12 hr tablet Take 600 mg by mouth 2 (two) times daily as needed.     Multiple Vitamins-Minerals (MULTIVITAMIN WITH MINERALS) tablet Take 1 tablet by mouth daily.     Omega-3 Fatty Acids (FISH OIL PO) Take 1 capsule by  mouth daily.     Propylene Glycol (SYSTANE BALANCE) 0.6 % SOLN Place 1 drop into both eyes 2 (two) times daily.     topiramate (TOPAMAX) 200 MG tablet Take 300 mg by mouth 2 (two) times daily.     Vitamin D3 (VITAMIN D) 25 MCG tablet Take 1,000 Units by mouth daily.     zinc oxide 20 % ointment Apply 1 application topically as needed for irritation.     No current facility-administered medications on file prior to visit.    ALLERGIES: Allergies  Allergen Reactions   Aspirin Other (See Comments)    CONTRAINDICATED DUE TO HISTORY OF BLEEDING IN BRAIN   Cheese     Cant take due to history of severe migraines   Chocolate     Cant take due to history of severe migraines   Coffee Flavor     Cant take due to history of severe migraines   Other      Nuts - Cant take due to history of severe migraines   Red Wine Complex [Germanium]     Cant take due to history of severe migraines    FAMILY HISTORY: Family History  Problem Relation Age of Onset   Heart disease Mother    Heart failure Mother    Heart attack Mother    Diabetes Father    Hyperlipidemia Father    Dementia Father     Prostate cancer Father    Melanoma Father    Colon polyps Father    Hyperlipidemia Paternal Uncle    Hypertension Paternal Uncle    Dementia Paternal Uncle    Heart attack Maternal Grandfather    Stroke Paternal Grandmother    CAD Maternal Uncle    COPD Maternal Uncle        smoker   Breast cancer Neg Hx    Colon cancer Neg Hx    Esophageal cancer Neg Hx    Stomach cancer Neg Hx    Rectal cancer Neg Hx     SOCIAL HISTORY: Social History   Socioeconomic History   Marital status: Single    Spouse name: Not on file   Number of children: Not on file   Years of education: Not on file   Highest education level: Not on file  Occupational History   Not on file  Tobacco Use   Smoking status: Never    Passive exposure: Past   Smokeless tobacco: Never  Vaping Use   Vaping Use: Never used  Substance and Sexual Activity   Alcohol use: No   Drug use: No   Sexual activity: Not Currently    Birth control/protection: None    Comment: 1st intercourse 65 yo-Fewer than 5 partners  Other Topics Concern   Not on file  Social History Narrative   Right handed until 3rd grade    Left handed   [facility] independent living    Social Determinants of Health   Financial Resource Strain: Not on file  Food Insecurity: Not on file  Transportation Needs: Not on file  Physical Activity: Not on file  Stress: Not on file  Social Connections: Not on file  Intimate Partner Violence: Not on file     PHYSICAL EXAM: Vitals:   03/04/21 1135  BP: 130/84  Pulse: 73  SpO2: 98%   General: No acute distress Head:  Normocephalic/atraumatic Skin/Extremities: No rash, no edema Neurological Exam: alert and awake. Speech is fluent but she is slower to respond compared  to prior visits. There is no aphasia or dysarthria. Fund of knowledge is appropriate.  Recent and remote memory are intact.  Attention and concentration are normal.   Cranial nerves: Pupils equal, round. Extraocular  movements intact with no nystagmus. Visual fields full.  No facial asymmetry.  Motor: increased tone on right with spastic contracture, 3/5 proximal right UE and LE, 0/5 finger and wrist extension. Right foot in AFO. Left 5/5. Gait: spastic hemiparetic with walker, she only uses her left hand to hold the walker. 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 MRI/MRV/CTA in 08/2020 no acute changes. She reports having a "mild stroke" in 01/2021 but did not have any brain imaging, speech today is slower and there is possibly more right-sided weakness. She is doing PT and OT at her ALF, continue as scheduled. She reports being told she cannot take aspirin or blood thinners in the past. MRI brain and MRA head without contrast will be ordered to assess for any acute changes. Continue Topiramate 378m BID and Depakote ER 10048mqhs. She take Baclofen qhs for spasticity. Follow-up in 6 months, call for any changes.     Thank you for allowing me to participate in her care.  Please do not hesitate to call for any questions or concerns.   Tonya NewerM.D.   CC: Dr. McMelford Aase

## 2021-03-04 NOTE — Telephone Encounter (Signed)
[facility], [facility] need [facility] for pt topamax. Michela Pitcher it was not changed on the form. The wrong dosage is on it. Their fax is [phone]

## 2021-03-04 NOTE — Telephone Encounter (Signed)
She has always been on Topiramate 300mg  daily (taking 200mg  tablets: 1 and 1/2 tablet twice a day). Pls let them know, Thanks

## 2021-03-04 NOTE — Patient Instructions (Addendum)
Schedule MRI brain without contrast, MRA head without contrast  2. Continue all your other medications  3. Follow-up in 6 months, call for any changes

## 2021-03-05 NOTE — Telephone Encounter (Signed)
[facility] need [facility] will send it over to them. To fax number [phone]

## 2021-03-06 NOTE — Telephone Encounter (Signed)
Script faxed to [phone]

## 2021-03-16 ENCOUNTER — Institutional Professional Consult (permissible substitution): Payer: Medicare Other | Admitting: Pulmonary Disease

## 2021-03-16 NOTE — Progress Notes (Deleted)
Synopsis: Referred in November 2022 for lung nodule by Unk Pinto, MD  Subjective:   PATIENT ID: Tonya Thompson GENDER: female DOB: [birth year], MRN: [id number]  No chief complaint on file.   This is a 65 year old female, past medical history of arthritis, reflux, COVID-19, hyperlipidemia, stroke.Patient was referred for evaluation of lung nodule.  Patient had a CT scan of the chest on 02/23/2021.  This is a part solid right upper lobe pulmonary nodule solid component of 6 mm with an overall size approximately 15 mm concerning for malignancy.  Patient was initially referred to thoracic surgery however with her other medical comorbidities recommended evaluation by pulmonary first.  Here today to discuss CT imaging and next steps.  OV 03/16/2021: ***   Past Medical History:  Diagnosis Date   Acute kidney injury ([place]) 09/03/2019   05-2019 kidney stone removed   Allergy    seasonal allergies   Arthritis    LEFT hand   AVM (arteriovenous malformation) brain    Cataract    bilateral-not a surgical candidate at this time (07/23/2020)   COVID-19 virus infection 08/2020   GERD (gastroesophageal reflux disease)    on meds   Hyperlipidemia    diet controlled   Insulin resistance 02/16/2018   Seizures ([place])    Grand Mal & Partial complex seizure disorder-last 1998   Stroke [facility])    5 total so far (age [phone])   Ureteral stone 05/10/2019   has multiple kidney stones noted from Urologist   Uses roller walker      Family History  Problem Relation Age of Onset   Heart disease Mother    Heart failure Mother    Heart attack Mother    Diabetes Father    Hyperlipidemia Father    Dementia Father    Prostate cancer Father    Melanoma Father    Colon polyps Father    Hyperlipidemia Paternal Uncle    Hypertension Paternal Uncle    Dementia Paternal Uncle    Heart attack Maternal Grandfather    Stroke Paternal Grandmother    CAD Maternal Uncle    COPD Maternal Uncle         smoker   Breast cancer Neg Hx    Colon cancer Neg Hx    Esophageal cancer Neg Hx    Stomach cancer Neg Hx    Rectal cancer Neg Hx      Past Surgical History:  Procedure Laterality Date   BRAIN SURGERY  09/19/1978   at [facility] Dr. Leida Lauth   COLONOSCOPY  2017   [place], [place]   CYSTOSCOPY/URETEROSCOPY/HOLMIUM LASER/STENT PLACEMENT Left 05/10/2019   Procedure: CYSTOSCOPY/RETROGRADE/URETEROSCOPY/HOLMIUM LASER/STENT PLACEMENT;  Surgeon: Raynelle Bring, MD;  Location: [facility];  Service: Urology;  Laterality: Left;   FOOT SURGERY Right 10/1985   ankle fusion, at [facility], Dr. Lorelee Cover   POLYPECTOMY  2017   multiple adenomas   TUBAL LIGATION  1981   WISDOM TOOTH EXTRACTION      Social History   Socioeconomic History   Marital status: Single    Spouse name: Not on file   Number of children: Not on file   Years of education: Not on file   Highest education level: Not on file  Occupational History   Not on file  Tobacco Use   Smoking status: Never    Passive exposure: Past   Smokeless tobacco: Never  Vaping Use   Vaping Use: Never used  Substance and Sexual Activity   Alcohol use: No  Drug use: No   Sexual activity: Not Currently    Birth control/protection: None    Comment: 1st intercourse 65 yo-Fewer than 5 partners  Other Topics Concern   Not on file  Social History Narrative   Right handed until 3rd grade    Left handed   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    Social Determinants of Health   Financial Resource Strain: Not on file  Food Insecurity: Not on file  Transportation Needs: Not on file  Physical Activity: Not on file  Stress: Not on file  Social Connections: Not on file  Intimate Partner Violence: Not on file     Allergies  Allergen Reactions   Aspirin Other (See Comments)    CONTRAINDICATED DUE TO HISTORY OF BLEEDING IN BRAIN   Cheese     Cant take due to history of severe migraines   Chocolate     Cant take due to history of severe migraines    Coffee Flavor     Cant take due to history of severe migraines   Other      Nuts - Cant take due to history of severe migraines   Red Wine Complex [Germanium]     Cant take due to history of severe migraines     Outpatient Medications Prior to Visit  Medication Sig Dispense Refill   acetaminophen (TYLENOL) 325 MG tablet Take 650 mg by mouth every 4 (four) hours as needed.     baclofen (LIORESAL) 10 MG tablet Take 1 tablet up to four times daily as needed (Patient taking differently: Take 10 mg by mouth daily. Take 1 tablet up to four times daily as needed) 120 each 11   calcium carbonate (TUMS - DOSED IN MG ELEMENTAL CALCIUM) 500 MG chewable tablet Chew 1 tablet by mouth daily.     Calcium Citrate-Vitamin D (CALCIUM + D PO) Take 1 tablet by mouth daily.     divalproex (DEPAKOTE ER) 500 MG 24 hr tablet Take 2 tablets every night 60 tablet 11   fexofenadine (ALLEGRA) 180 MG tablet Take 180 mg by mouth daily.     guaiFENesin (MUCINEX) 600 MG 12 hr tablet Take 600 mg by mouth 2 (two) times daily as needed. (Patient not taking: Reported on 03/04/2021)     Multiple Vitamins-Minerals (MULTIVITAMIN WITH MINERALS) tablet Take 1 tablet by mouth daily.     Omega-3 Fatty Acids (FISH OIL PO) Take 1 capsule by mouth daily.     Propylene Glycol (SYSTANE BALANCE) 0.6 % SOLN Place 1 drop into both eyes 2 (two) times daily.     topiramate (TOPAMAX) 200 MG tablet Take 300 mg by mouth 2 (two) times daily.     Vitamin D3 (VITAMIN D) 25 MCG tablet Take 1,000 Units by mouth daily.     zinc oxide 20 % ointment Apply 1 application topically as needed for irritation. (Patient not taking: Reported on 03/04/2021)     No facility-administered medications prior to visit.    ROS   Objective:  Physical Exam   There were no vitals filed for this visit.   on *** LPM *** RA BMI Readings from Last 3 Encounters:  03/04/21 24.93 kg/m  02/20/21 24.43 kg/m  01/27/21 24.09 kg/m   Wt Readings from Last 3 Encounters:   03/04/21 149 lb 12.8 oz (67.9 kg)  02/20/21 146 lb 12.8 oz (66.6 kg)  01/27/21 147 lb (66.7 kg)     CBC    Component Value Date/Time  WBC 6.2 01/27/2021 1731   RBC 4.20 01/27/2021 1731   HGB 14.0 01/27/2021 1731   HCT 42.3 01/27/2021 1731   PLT 176 01/27/2021 1731   MCV 100.7 (H) 01/27/2021 1731   MCH 33.3 (H) 01/27/2021 1731   MCHC 33.1 01/27/2021 1731   RDW 12.5 01/27/2021 1731   LYMPHSABS 2,046 01/27/2021 1731   MONOABS 0.8 07/28/2020 1258   EOSABS 99 01/27/2021 1731   BASOSABS 31 01/27/2021 1731    ***  Chest Imaging: ***  Pulmonary Functions Testing Results: No flowsheet data found.  FeNO: ***  Pathology: ***  Echocardiogram: ***  Heart Catheterization: ***    Assessment & Plan:   No diagnosis found.  Discussion: ***   Current Outpatient Medications:    acetaminophen (TYLENOL) 325 MG tablet, Take 650 mg by mouth every 4 (four) hours as needed., Disp: , Rfl:    baclofen (LIORESAL) 10 MG tablet, Take 1 tablet up to four times daily as needed (Patient taking differently: Take 10 mg by mouth daily. Take 1 tablet up to four times daily as needed), Disp: 120 each, Rfl: 11   calcium carbonate (TUMS - DOSED IN MG ELEMENTAL CALCIUM) 500 MG chewable tablet, Chew 1 tablet by mouth daily., Disp: , Rfl:    Calcium Citrate-Vitamin D (CALCIUM + D PO), Take 1 tablet by mouth daily., Disp: , Rfl:    divalproex (DEPAKOTE ER) 500 MG 24 hr tablet, Take 2 tablets every night, Disp: 60 tablet, Rfl: 11   fexofenadine (ALLEGRA) 180 MG tablet, Take 180 mg by mouth daily., Disp: , Rfl:    guaiFENesin (MUCINEX) 600 MG 12 hr tablet, Take 600 mg by mouth 2 (two) times daily as needed. (Patient not taking: Reported on 03/04/2021), Disp: , Rfl:    Multiple Vitamins-Minerals (MULTIVITAMIN WITH MINERALS) tablet, Take 1 tablet by mouth daily., Disp: , Rfl:    Omega-3 Fatty Acids (FISH OIL PO), Take 1 capsule by mouth daily., Disp: , Rfl:    Propylene Glycol (SYSTANE BALANCE) 0.6 %  SOLN, Place 1 drop into both eyes 2 (two) times daily., Disp: , Rfl:    topiramate (TOPAMAX) 200 MG tablet, Take 300 mg by mouth 2 (two) times daily., Disp: , Rfl:    Vitamin D3 (VITAMIN D) 25 MCG tablet, Take 1,000 Units by mouth daily., Disp: , Rfl:    zinc oxide 20 % ointment, Apply 1 application topically as needed for irritation. (Patient not taking: Reported on 03/04/2021), Disp: , Rfl:   I spent *** minutes dedicated to the care of this patient on the date of this encounter to include pre-visit review of records, face-to-face time with the patient discussing conditions above, post visit ordering of testing, clinical documentation with the electronic health record, making appropriate referrals as documented, and communicating necessary findings to members of the patients care team.   Garner Nash, DO [facility] 03/16/2021 8:54 AM

## 2021-03-19 ENCOUNTER — Encounter: Payer: Self-pay | Admitting: Pulmonary Disease

## 2021-03-19 ENCOUNTER — Ambulatory Visit (INDEPENDENT_AMBULATORY_CARE_PROVIDER_SITE_OTHER): Payer: Medicare Other | Admitting: Pulmonary Disease

## 2021-03-19 ENCOUNTER — Telehealth: Payer: Self-pay | Admitting: Pulmonary Disease

## 2021-03-19 ENCOUNTER — Other Ambulatory Visit: Payer: Self-pay

## 2021-03-19 VITALS — BP 120/74 | HR 90 | Temp 97.8°F | Ht 65.0 in | Wt 146.5 lb

## 2021-03-19 DIAGNOSIS — Z8673 Personal history of transient ischemic attack (TIA), and cerebral infarction without residual deficits: Secondary | ICD-10-CM | POA: Diagnosis not present

## 2021-03-19 DIAGNOSIS — R911 Solitary pulmonary nodule: Secondary | ICD-10-CM

## 2021-03-19 NOTE — Patient Instructions (Addendum)
Thank you for visiting Dr. Valeta Harms at [facility]. Today we recommend the following:  Orders Placed This Encounter  Procedures   Procedural/ Surgical Case Request: ROBOTIC ASSISTED NAVIGATIONAL BRONCHOSCOPY   CT Super D Chest Wo Contrast   Ambulatory referral to Pulmonology   Bronchoscopy to be scheduled on 04/21/2021  Appointment with Geraldo Pitter, NP on 04/30/2021 to discuss bronchoscopy results.    Please do your part to reduce the spread of COVID-19.

## 2021-03-19 NOTE — Telephone Encounter (Signed)
I scheduled pt for 12/20 at 9:30.  Pt will go for covid test on 12/16.  CT scheduled at [facility] for 12/13.  I spoke to Candelaria and disc will be sent to [facility].  Pt just left so will give her time to get home before calling her with info.

## 2021-03-19 NOTE — Progress Notes (Signed)
Synopsis: Referred in Nov 2022 for lung nodule by Unk Pinto, MD  Subjective:   PATIENT ID: Tonya Thompson GENDER: female DOB: 04/15/1956, MRN: [id number]  Chief Complaint  Patient presents with   Consult    This is a 65 year old female, past medical history of multiple CVAs, history of COVID-19, gastroesophageal reflux, seizure disorder she has had a total of at least 5 or 6 strokes most of them were when she was younger.  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  She lives in assisted living at this point.  She originally had a CT scan in April 2022 which revealed a right upper lobe subsolid lesion.  She had subsequent CT scan follow-up which revealed persistence of this lesion last month.  She was referred from her primary care provider for evaluation of this right upper lobe nodule to discuss next steps.  She has secondhand smoke exposure from her mother when she lived with her several years ago.  Overall lifelong non-smoker.    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 1998   Stroke [facility])    5 total so far (age [phone])   Ureteral stone 05/10/2019   has multiple kidney stones noted from Urologist   Uses roller walker      Family History  Problem Relation Age of Onset   Heart disease Mother    Heart failure Mother    Heart attack Mother    Diabetes Father    Hyperlipidemia Father    Dementia Father    Prostate cancer Father    Melanoma Father    Colon polyps Father    Hyperlipidemia Paternal Uncle     Hypertension Paternal Uncle    Dementia Paternal Uncle    Heart attack Maternal Grandfather    Stroke Paternal Grandmother    CAD Maternal Uncle    COPD Maternal Uncle        smoker   Breast cancer Neg Hx    Colon cancer Neg Hx    Esophageal cancer Neg Hx    Stomach cancer Neg Hx    Rectal cancer Neg Hx      Past Surgical History:  Procedure Laterality Date   BRAIN SURGERY  09/19/1978   at [facility], Dr. Leida Lauth   COLONOSCOPY  2017   [place], [place]   CYSTOSCOPY/URETEROSCOPY/HOLMIUM LASER/STENT PLACEMENT Left 05/10/2019   Procedure: CYSTOSCOPY/RETROGRADE/URETEROSCOPY/HOLMIUM LASER/STENT PLACEMENT;  Surgeon: Raynelle Bring, MD;  Location: [facility];  Service: Urology;  Laterality: Left;   FOOT SURGERY Right 10/1985   ankle fusion, at [facility], Dr. Lorelee Cover   POLYPECTOMY  2017   multiple adenomas   TUBAL LIGATION  1981   WISDOM TOOTH EXTRACTION      Social History   Socioeconomic History   Marital status: Single    Spouse name: Not on file   Number of children: Not on file   Years of education: Not on file   Highest education level: Not on file  Occupational History  Not on file  Tobacco Use   Smoking status: Never    Passive exposure: Past   Smokeless tobacco: Never  Vaping Use   Vaping Use: Never used  Substance and Sexual Activity   Alcohol use: No   Drug use: No   Sexual activity: Not Currently    Birth control/protection: None    Comment: 1st intercourse 65 yo-Fewer than 5 partners  Other Topics Concern   Not on file  Social History Narrative   Right handed until 3rd grade    Left handed   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    Social Determinants of Health   Financial Resource Strain: Not on file  Food Insecurity: Not on file  Transportation Needs: Not on file  Physical Activity: Not on file  Stress: Not on file  Social Connections: Not on file  Intimate Partner Violence: Not on file     Allergies  Allergen Reactions   Aspirin Other (See Comments)     CONTRAINDICATED DUE TO HISTORY OF BLEEDING IN BRAIN   Cheese     Cant take due to history of severe migraines   Chocolate     Cant take due to history of severe migraines   Coffee Flavor     Cant take due to history of severe migraines   Other      Nuts - Cant take due to history of severe migraines   Red Wine Complex [Germanium]     Cant take due to history of severe migraines     Outpatient Medications Prior to Visit  Medication Sig Dispense Refill   acetaminophen (TYLENOL) 325 MG tablet Take 650 mg by mouth every 4 (four) hours as needed.     baclofen (LIORESAL) 10 MG tablet Take 1 tablet up to four times daily as needed (Patient taking differently: Take 10 mg by mouth daily. Take 1 tablet up to four times daily as needed) 120 each 11   calcium carbonate (TUMS - DOSED IN MG ELEMENTAL CALCIUM) 500 MG chewable tablet Chew 1 tablet by mouth daily.     Calcium Citrate-Vitamin D (CALCIUM + D PO) Take 1 tablet by mouth daily.     divalproex (DEPAKOTE ER) 500 MG 24 hr tablet Take 2 tablets every night 60 tablet 11   fexofenadine (ALLEGRA) 180 MG tablet Take 180 mg by mouth daily.     Multiple Vitamins-Minerals (MULTIVITAMIN WITH MINERALS) tablet Take 1 tablet by mouth daily.     Omega-3 Fatty Acids (FISH OIL PO) Take 1 capsule by mouth daily.     Propylene Glycol (SYSTANE BALANCE) 0.6 % SOLN Place 1 drop into both eyes 2 (two) times daily.     topiramate (TOPAMAX) 200 MG tablet Take 300 mg by mouth 2 (two) times daily.     Vitamin D3 (VITAMIN D) 25 MCG tablet Take 1,000 Units by mouth daily.     guaiFENesin (MUCINEX) 600 MG 12 hr tablet Take 600 mg by mouth 2 (two) times daily as needed. (Patient not taking: Reported on 03/04/2021)     zinc oxide 20 % ointment Apply 1 application topically as needed for irritation. (Patient not taking: Reported on 03/04/2021)     No facility-administered medications prior to visit.    Review of Systems  Constitutional:  Negative for chills, fever,  malaise/fatigue and weight loss.  HENT:  Negative for hearing loss, sore throat and tinnitus.   Eyes:  Negative for blurred vision and double vision.  Respiratory:  Negative for  cough, hemoptysis, sputum production, shortness of breath, wheezing and stridor.   Cardiovascular:  Negative for chest pain, palpitations, orthopnea, leg swelling and PND.  Gastrointestinal:  Negative for abdominal pain, constipation, diarrhea, heartburn, nausea and vomiting.  Genitourinary:  Negative for dysuria, hematuria and urgency.  Musculoskeletal:  Negative for joint pain and myalgias.  Skin:  Negative for itching and rash.  Neurological:  Positive for weakness. Negative for dizziness, tingling and headaches.       Mobility issues at baseline  Endo/Heme/Allergies:  Negative for environmental allergies. Does not bruise/bleed easily.  Psychiatric/Behavioral:  Negative for depression. The patient is not nervous/anxious and does not have insomnia.   All other systems reviewed and are negative.   Objective:  Physical Exam Vitals reviewed.  Constitutional:      General: She is not in acute distress.    Appearance: She is well-developed.  HENT:     Head: Normocephalic and atraumatic.  Eyes:     General: No scleral icterus.    Conjunctiva/sclera: Conjunctivae normal.     Pupils: Pupils are equal, round, and reactive to light.  Neck:     Vascular: No JVD.     Trachea: No tracheal deviation.  Cardiovascular:     Rate and Rhythm: Normal rate and regular rhythm.     Heart sounds: Normal heart sounds. No murmur heard. Pulmonary:     Effort: Pulmonary effort is normal. No tachypnea, accessory muscle usage or respiratory distress.     Breath sounds: No stridor. No wheezing, rhonchi or rales.  Abdominal:     General: There is no distension.     Palpations: Abdomen is soft.     Tenderness: There is no abdominal tenderness.  Musculoskeletal:        General: No tenderness.     Cervical back: Neck supple.      Comments: Right ankle brace  Lymphadenopathy:     Cervical: No cervical adenopathy.  Skin:    General: Skin is warm and dry.     Capillary Refill: Capillary refill takes less than 2 seconds.     Findings: No rash.  Neurological:     Mental Status: She is alert and oriented to person, place, and time.  Psychiatric:        Behavior: Behavior normal.     Vitals:   03/19/21 1500  BP: 120/74  Pulse: 90  Temp: 97.8 F (36.6 C)  TempSrc: Oral  SpO2: 98%  Weight: 146 lb 8 oz (66.5 kg)  Height: 5\' 5"  (1.651 m)   98% on RA BMI Readings from Last 3 Encounters:  03/19/21 24.38 kg/m  03/04/21 24.93 kg/m  02/20/21 24.43 kg/m   Wt Readings from Last 3 Encounters:  03/19/21 146 lb 8 oz (66.5 kg)  03/04/21 149 lb 12.8 oz (67.9 kg)  02/20/21 146 lb 12.8 oz (66.6 kg)     CBC    Component Value Date/Time   WBC 6.2 01/27/2021 1731   RBC 4.20 01/27/2021 1731   HGB 14.0 01/27/2021 1731   HCT 42.3 01/27/2021 1731   PLT 176 01/27/2021 1731   MCV 100.7 (H) 01/27/2021 1731   MCH 33.3 (H) 01/27/2021 1731   MCHC 33.1 01/27/2021 1731   RDW 12.5 01/27/2021 1731   LYMPHSABS 2,046 01/27/2021 1731   MONOABS 0.8 07/28/2020 1258   EOSABS 99 01/27/2021 1731   BASOSABS 31 01/27/2021 1731     Chest Imaging: 02/03/2021 CT chest: Persistent subsolid pulmonary nodule within the right upper lobe concerning for possible  stage I malignancy. The patient's images have been independently reviewed by me.    Pulmonary Functions Testing Results: No flowsheet data found.  FeNO:   Pathology:   Echocardiogram:   Heart Catheterization:     Assessment & Plan:     ICD-10-CM   1. Right upper lobe pulmonary nodule  R91.1     2. Lung nodule  R91.1 Procedural/ Surgical Case Request: ROBOTIC ASSISTED NAVIGATIONAL BRONCHOSCOPY    Ambulatory referral to Pulmonology    CT Super D Chest Wo Contrast    3. History of CVA (cerebrovascular accident)  Z86.73       Discussion:  This is a  65 year old female, past medical history of multiple strokes, she does have some debility because of this, does have a rolling walker mobility issues and lives in an assisted living.  She does have a seizure disorder as well.  She has a persistent right upper lobe subsolid lesion within the chest concerning for primary malignancy.  We discussed potential watchful waiting to see if there is any evolution of the nodule versus going ahead to proceed with biopsy.  Plan: Today in the office we discussed the risk benefits and alternatives of proceeding with robotic assisted navigation. Patient is agreeable to proceed with tissue sampling of the nodule. She is pretty nervous about this would like to go ahead and have a biopsy to decide whether or not this is a malignancy. I think it is reasonable to move forward with tissue sampling.  She would like to have this done in a couple of weeks right before Christmas if possible.  We will tentatively set bronchoscopy date for 04/21/2021. I think since is going to be at least another month before we get bronchoscopy done we will have a repeat super D noncontrasted CT chest complete right prior to the procedure.  Orders placed.  We appreciate PCC's help with scheduling.    Current Outpatient Medications:    acetaminophen (TYLENOL) 325 MG tablet, Take 650 mg by mouth every 4 (four) hours as needed., Disp: , Rfl:    baclofen (LIORESAL) 10 MG tablet, Take 1 tablet up to four times daily as needed (Patient taking differently: Take 10 mg by mouth daily. Take 1 tablet up to four times daily as needed), Disp: 120 each, Rfl: 11   calcium carbonate (TUMS - DOSED IN MG ELEMENTAL CALCIUM) 500 MG chewable tablet, Chew 1 tablet by mouth daily., Disp: , Rfl:    Calcium Citrate-Vitamin D (CALCIUM + D PO), Take 1 tablet by mouth daily., Disp: , Rfl:    divalproex (DEPAKOTE ER) 500 MG 24 hr tablet, Take 2 tablets every night, Disp: 60 tablet, Rfl: 11   fexofenadine (ALLEGRA)  180 MG tablet, Take 180 mg by mouth daily., Disp: , Rfl:    Multiple Vitamins-Minerals (MULTIVITAMIN WITH MINERALS) tablet, Take 1 tablet by mouth daily., Disp: , Rfl:    Omega-3 Fatty Acids (FISH OIL PO), Take 1 capsule by mouth daily., Disp: , Rfl:    Propylene Glycol (SYSTANE BALANCE) 0.6 % SOLN, Place 1 drop into both eyes 2 (two) times daily., Disp: , Rfl:    topiramate (TOPAMAX) 200 MG tablet, Take 300 mg by mouth 2 (two) times daily., Disp: , Rfl:    Vitamin D3 (VITAMIN D) 25 MCG tablet, Take 1,000 Units by mouth daily., Disp: , Rfl:    guaiFENesin (MUCINEX) 600 MG 12 hr tablet, Take 600 mg by mouth 2 (two) times daily as needed. (Patient not taking: Reported on  03/04/2021), Disp: , Rfl:    zinc oxide 20 % ointment, Apply 1 application topically as needed for irritation. (Patient not taking: Reported on 03/04/2021), Disp: , Rfl:   I spent 62 minutes dedicated to the care of this patient on the date of this encounter to include pre-visit review of records, face-to-face time with the patient discussing conditions above, post visit ordering of testing, clinical documentation with the electronic health record, making appropriate referrals as documented, and communicating necessary findings to members of the patients care team.   Garner Nash, DO [facility] 03/19/2021 3:10 PM

## 2021-03-20 NOTE — Telephone Encounter (Signed)
Left vm for pt to call me for appt info.

## 2021-03-23 ENCOUNTER — Other Ambulatory Visit: Payer: Medicare Other

## 2021-03-23 NOTE — Telephone Encounter (Signed)
Spoke to pt & gave her appt info.  Mailed letter with appt info.

## 2021-04-02 NOTE — Progress Notes (Signed)
3 MONTH FOLLOW UP  Assessment:    Seizure disorder ([place]) Followed by Dr. Delice Lesch Continue topamax; last seizure remote  Right hemiparesis ([place]) Continue regular exercise, PT as needed Continue rolling walker and foot brace   CKD (chronic kidney disease) stage 3  (HCC) -     COMPLETE METABOLIC PANEL WITH GFR Increase fluids, follow up urology, we are monitoring labs closely, nephrology didn't recommend further follow up  Labile hypertension -     CBC with Differential/Platelet -     COMPLETE METABOLIC PANEL WITH GFR -     TSH - continue medications, DASH diet, exercise and monitor at home. Call if greater than 130/80.   Abnormal glucose Discussed disease progression and risks Discussed diet/exercise, weight management and risk modification  Hyperlipidemia, mixed -     Lipid panel check lipids decrease fatty foods increase activity.   Headache, unspecified headache type Change in mental status, word finding difficulty Monitor, neuro pending MRI/MRA - denies changes since her visit with Dr. Delice Lesch, do note some slowed cognition/word finding difficulties compared   Vitamin D deficiency Continue supplement  Medication management -     Magnesium  Abnormal glucose Last A1C at goal Discussed disease progression and risks Discussed diet/exercise, weight management and risk modification - CMP/GFR  Pulm nodule hx of passive smoke exposure as child Persistent and concern for possible adenoma, pending biopsy by Dr. Valeta Harms 04/21/2021  Orders Placed This Encounter  Procedures   CBC with Differential/Platelet   COMPLETE METABOLIC PANEL WITH GFR   Magnesium   Lipid panel   TSH      Over 40 minutes of exam, counseling, chart review and critical decision making was performed Future Appointments  Date Time Provider [place]  04/13/2021 10:00 AM GI-315 MR 2 GI-315MRI GI-315 [facility]  04/13/2021 10:40 AM GI-315 MR 2 GI-315MRI GI-315 [facility]. [facility]  04/14/2021 11:30 AM  WL-CT 2 WL-CT [place]  04/30/2021  9:00 AM Martyn Ehrich, NP [facility] None  08/13/2021  9:30 AM Liane Comber, NP [facility] None  09/04/2021 10:00 AM Cameron Sprang, MD [facility] None  12/23/2021  9:00 AM Liane Comber, NP [facility] None    Subjective:  Tonya Thompson is a 65 y.o. female who presents for 3 month follow up. 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  [facility], her father, uncle and cousin are there. Very happy there. [place] is provided. She walks with a cane/rolling walker, did have fall without injury, she does her own mediations, no issues with food, some meals provided. Wears hearing aids.   Formerly followed with Dr. 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 Patient has residual R hemiparesis. Her last seizure reportedly was circa 1988. She is on topamax and depakote, had recent normal level check 01/29/2021. She now follows with local provider, Dr. Delice Lesch.   Recently has been having frequent headaches (every other day) and some change  in mental status (found with possible facial drooping, drooling on 01/26/2021 that resolved), concern for possible CVA event; she saw  neuro has MRI/MRA planned 04/13/2021 (late date due to rescheduled by patient). Note slowed cognition and more difficulty with word finding today compared to my last  encounter with her 06/26/2020. She reports headaches do improve with tylenol and ice pack. She denies other neuro sx (new weakness, paresthesias).   She had CT chest 08/12/2020 that incidentally showed partial solid 14 mm nodule, persistent on follow up CT 02/23/2021 with concern for possible adenocarcinoma. She was referred to Dr. Valeta Harms and pending robotic bronchoscopy for bioposy on 04/21/2021. Passive smoke exposure as a child. Also planning EGD per patient. She denies night sweats or resp sx.   BMI is Body mass index is 24.46 kg/m., she has been working on diet and exercise, walking daily, working on stretching/muscle training regimen.  Pushing water intake.  Wt Readings from Last 3 Encounters:  04/03/21 147 lb (66.7 kg)  03/19/21 146 lb 8 oz (66.5 kg)  03/04/21 149 lb 12.8 oz (67.9 kg)   Her blood pressure has not been controlled at home, today their BP is BP: 128/80  She does workout. She denies chest pain, shortness of breath, dizziness.   She is not on cholesterol medication, taking omega 3 supplement only and denies myalgias. Her cholesterol is not at goal. The cholesterol last visit was:   Lab Results  Component Value Date   CHOL 177 12/19/2020   HDL 49 (L) 12/19/2020   LDLCALC 100 (H) 12/19/2020   TRIG 179 (H) 12/19/2020   CHOLHDL 3.6 12/19/2020    She has been working on diet and exercise for glucose management, and denies polydipsia and polyuria. Last A1C in the office was:  Lab Results  Component Value Date   HGBA1C 4.8 12/19/2020   She saw nephrology in 04/2019 for worsening Kidney function, went from GFR 56 in 08/2018 to GFR of 26/27 in 02/2019, had US renal and CT AB, had stone removal with [facility] Jan 2021, has annual follow up. Renals improved since then and stable. Last GFR on 01/27/2021 was 50.  Lab Results  Component Value Date   CREATININE 1.23 (H) 01/27/2021   CREATININE 1.12 (H) 12/19/2020   CREATININE 1.20 (H) 07/28/2020   Patient is on Vitamin D  supplement.   Lab Results  Component Value Date   VD25OH 73 12/19/2020       Medication Review:    Current Outpatient Medications (Respiratory):    fexofenadine (ALLEGRA) 180 MG tablet, Take 180 mg by mouth daily.   guaiFENesin (MUCINEX) 600 MG 12 hr tablet, Take 600 mg by mouth 2 (two) times daily as needed.  Current Outpatient Medications (Analgesics):    acetaminophen (TYLENOL) 325 MG tablet, Take 650 mg by mouth every 4 (four) hours as needed.   Current Outpatient Medications (Other):    baclofen (LIORESAL) 10 MG tablet, Take 1 tablet up to four times daily as needed (Patient taking differently: Take 10 mg by mouth daily. Take 1 tablet up to four times daily as needed)   calcium carbonate (TUMS - DOSED IN MG ELEMENTAL CALCIUM) 500 MG chewable tablet, Chew 1 tablet by mouth daily.   Calcium Citrate-Vitamin D (CALCIUM + D PO), Take 1 tablet by mouth daily.   divalproex (DEPAKOTE ER) 500 MG 24 hr tablet, Take 2 tablets every night   Multiple Vitamins-Minerals (MULTIVITAMIN WITH MINERALS) tablet, Take 1 tablet by mouth daily.   Omega-3 Fatty  Acids (FISH OIL PO), Take 1 capsule by mouth daily.   Propylene Glycol (SYSTANE BALANCE) 0.6 % SOLN, Place 1 drop into both eyes 2 (two) times daily.   topiramate (TOPAMAX) 200 MG tablet, Take 300 mg by mouth 2 (two) times daily.   Vitamin D3 (VITAMIN D) 25 MCG tablet, Take 1,000 Units by mouth daily.   zinc oxide 20 % ointment, Apply 1 application topically as needed for irritation.   Allergies  Allergen Reactions   Aspirin Other (See Comments)    CONTRAINDICATED DUE TO HISTORY OF BLEEDING IN BRAIN   Cheese     Cant take due to history of severe migraines   Chocolate     Cant take due to history of severe migraines   Coffee Flavor     Cant take due to history of severe migraines   Other      Nuts - Cant take due to history of severe migraines   Red Wine Complex [Germanium]     Cant take due to history of severe migraines     Current Problems (verified) Patient Active Problem List   Diagnosis Date Noted   Dense breasts 12/19/2020   History of CVA (cerebrovascular accident) 12/18/2020   Pulmonary nodules 12/18/2020   History of colon polyps 06/26/2020   History of renal stone 09/04/2019   Medication management 02/16/2018   Osteopenia 01/10/2018   CKD (chronic kidney disease) stage 3, GFR 30-59 ml/min (HCC) 11/10/2017   Labile hypertension 06/01/2017   Hyperlipidemia, mixed 06/01/2017   Abnormal glucose 06/01/2017   Vitamin D deficiency 06/01/2017   Epilepsy, grand mal ([place]) 11/15/2016   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 FAMILY HISTORY Her family history includes CAD in her maternal uncle; COPD in her maternal uncle; Colon polyps in her father; Dementia in her father and paternal uncle; Diabetes in her father; Heart attack in her maternal grandfather and mother; Heart disease in her mother; Heart failure in her mother; Hyperlipidemia in her father and paternal uncle; Hypertension in her paternal uncle; Melanoma in her father; Prostate cancer in her father; Stroke in her paternal grandmother. SOCIAL HISTORY She  reports that she has never smoked. She has been exposed to tobacco smoke. She has never used smokeless tobacco. She reports that she does not drink alcohol and does not use drugs.   Review of Systems  Constitutional:  Negative for malaise/fatigue and weight loss.  HENT:  Negative for hearing loss and tinnitus.   Eyes:  Negative for blurred vision and double vision.  Respiratory:   Negative for cough, sputum production, shortness of breath and wheezing.   Cardiovascular:  Negative for chest pain, palpitations, orthopnea, claudication, leg swelling and PND.  Gastrointestinal:  Negative for abdominal pain, blood in stool, constipation, diarrhea, heartburn, melena, nausea and vomiting.  Genitourinary: Negative.   Musculoskeletal:  Negative for falls (Denies falls since last visit), joint pain and myalgias.  Skin:  Negative for rash.  Neurological:  Positive for headaches (neuro following). Negative for dizziness, tingling, sensory change and weakness.  Endo/Heme/Allergies:  Negative for polydipsia.  Psychiatric/Behavioral: Negative.  Negative for depression, memory loss, substance abuse and suicidal ideas. The patient is not nervous/anxious and does  not have insomnia.   All other systems reviewed and are negative.  Objective:     Today's Vitals   04/03/21 0938  BP: 128/80  Pulse: 76  Temp: 97.7 F (36.5 C)  SpO2: 99%  Weight: 147 lb (66.7 kg)   Body mass index is 24.46 kg/m.  General appearance: alert, no distress, WD/WN, female HEENT: normocephalic, sclerae anicteric, TMs pearly, nares patent, no discharge or erythema, pharynx normal Oral cavity: MMM, no lesions Neck: supple, no lymphadenopathy, no thyromegaly, no masses Heart: RRR, normal S1, S2, no murmurs Lungs: CTA bilaterally, no wheezes, rhonchi, or rales Abdomen: +bs, soft, non tender, non distended, no masses, no hepatomegaly, no splenomegaly Musculoskeletal: nontender, no swelling, no effusion Extremities: no edema, no cyanosis, no clubbing Pulses: 2+ symmetric, upper and lower extremities, normal cap refill Neurological: alert, oriented x 3, however slowed cognition from baseline, some word finding difficulty, CN2-12 intact, strength 5/5 upper extremities and lower extremities left side, right side with  decreased strength and spastic right hip and right hand contracture, right ankle in brace, no  cerebellar signs, gait very slow and shuffling with stability assistance, here in wheelchair  Psychiatric: flat affect, behavior normal, pleasant     Izora Ribas, NP   04/03/2021

## 2021-04-03 ENCOUNTER — Ambulatory Visit (INDEPENDENT_AMBULATORY_CARE_PROVIDER_SITE_OTHER): Payer: Medicare Other | Admitting: Adult Health

## 2021-04-03 ENCOUNTER — Encounter: Payer: Self-pay | Admitting: Adult Health

## 2021-04-03 ENCOUNTER — Other Ambulatory Visit: Payer: Self-pay

## 2021-04-03 VITALS — BP 128/80 | HR 76 | Temp 97.7°F | Wt 147.0 lb

## 2021-04-03 DIAGNOSIS — E782 Mixed hyperlipidemia: Secondary | ICD-10-CM | POA: Diagnosis not present

## 2021-04-03 DIAGNOSIS — R7309 Other abnormal glucose: Secondary | ICD-10-CM | POA: Diagnosis not present

## 2021-04-03 DIAGNOSIS — Z79899 Other long term (current) drug therapy: Secondary | ICD-10-CM | POA: Diagnosis not present

## 2021-04-03 DIAGNOSIS — R519 Headache, unspecified: Secondary | ICD-10-CM | POA: Diagnosis not present

## 2021-04-03 DIAGNOSIS — R0989 Other specified symptoms and signs involving the circulatory and respiratory systems: Secondary | ICD-10-CM | POA: Diagnosis not present

## 2021-04-03 DIAGNOSIS — G8191 Hemiplegia, unspecified affecting right dominant side: Secondary | ICD-10-CM

## 2021-04-03 DIAGNOSIS — N1831 Chronic kidney disease, stage 3a: Secondary | ICD-10-CM | POA: Diagnosis not present

## 2021-04-03 DIAGNOSIS — R918 Other nonspecific abnormal finding of lung field: Secondary | ICD-10-CM | POA: Diagnosis not present

## 2021-04-03 DIAGNOSIS — G40909 Epilepsy, unspecified, not intractable, without status epilepticus: Secondary | ICD-10-CM | POA: Diagnosis not present

## 2021-04-03 DIAGNOSIS — E559 Vitamin D deficiency, unspecified: Secondary | ICD-10-CM

## 2021-04-04 ENCOUNTER — Encounter: Payer: Self-pay | Admitting: Adult Health

## 2021-04-04 ENCOUNTER — Other Ambulatory Visit: Payer: Self-pay | Admitting: Adult Health

## 2021-04-04 DIAGNOSIS — R7989 Other specified abnormal findings of blood chemistry: Secondary | ICD-10-CM | POA: Insufficient documentation

## 2021-04-04 DIAGNOSIS — K759 Inflammatory liver disease, unspecified: Secondary | ICD-10-CM

## 2021-04-04 DIAGNOSIS — N289 Disorder of kidney and ureter, unspecified: Secondary | ICD-10-CM | POA: Insufficient documentation

## 2021-04-04 DIAGNOSIS — E039 Hypothyroidism, unspecified: Secondary | ICD-10-CM

## 2021-04-04 DIAGNOSIS — Z1159 Encounter for screening for other viral diseases: Secondary | ICD-10-CM

## 2021-04-04 LAB — LIPID PANEL
Cholesterol: 184 mg/dL (ref <200)
HDL: 60 mg/dL (ref >=50)
LDL Cholesterol (Calc): 101 mg/dL (calc) — ABNORMAL HIGH
Non-HDL Cholesterol (Calc): 124 mg/dL (calc) (ref <130)
Total CHOL/HDL Ratio: 3.1 (calc) (ref <5.0)
Triglycerides: 126 mg/dL (ref <150)

## 2021-04-04 LAB — CBC WITH DIFFERENTIAL/PLATELET
Absolute Monocytes: 643 {cells}/uL (ref 200–950)
Basophils Absolute: 32 {cells}/uL (ref 0–200)
Basophils Relative: 0.6 %
Eosinophils Absolute: 130 {cells}/uL (ref 15–500)
Eosinophils Relative: 2.4 %
HCT: 44 % (ref 35.0–45.0)
Hemoglobin: 14.7 g/dL (ref 11.7–15.5)
Lymphs Abs: 1566 {cells}/uL (ref 850–3900)
MCH: 34.5 pg — ABNORMAL HIGH (ref 27.0–33.0)
MCHC: 33.4 g/dL (ref 32.0–36.0)
MCV: 103.3 fL — ABNORMAL HIGH (ref 80.0–100.0)
MPV: 10.8 fL (ref 7.5–12.5)
Monocytes Relative: 11.9 %
Neutro Abs: 3029 {cells}/uL (ref 1500–7800)
Neutrophils Relative %: 56.1 %
Platelets: 158 10*3/uL (ref 140–400)
RBC: 4.26 Million/uL (ref 3.80–5.10)
RDW: 13.5 % (ref 11.0–15.0)
Total Lymphocyte: 29 %
WBC: 5.4 10*3/uL (ref 3.8–10.8)

## 2021-04-04 LAB — TSH: TSH: 4.77 mIU/L — ABNORMAL HIGH (ref 0.40–4.50)

## 2021-04-04 LAB — COMPLETE METABOLIC PANEL WITH GFR
AG Ratio: 1.3 (calc) (ref 1.0–2.5)
ALT: 31 U/L — ABNORMAL HIGH (ref 6–29)
AST: 49 U/L — ABNORMAL HIGH (ref 10–35)
Albumin: 3.6 g/dL (ref 3.6–5.1)
Alkaline phosphatase (APISO): 85 U/L (ref 37–153)
BUN/Creatinine Ratio: 23 (calc) — ABNORMAL HIGH (ref 6–22)
BUN: 33 mg/dL — ABNORMAL HIGH (ref 7–25)
CO2: 26 mmol/L (ref 20–32)
Calcium: 9.3 mg/dL (ref 8.6–10.4)
Chloride: 107 mmol/L (ref 98–110)
Creat: 1.42 mg/dL — ABNORMAL HIGH (ref 0.50–1.05)
Globulin: 2.7 g/dL (calc) (ref 1.9–3.7)
Glucose, Bld: 55 mg/dL — ABNORMAL LOW (ref 65–99)
Potassium: 3.6 mmol/L (ref 3.5–5.3)
Sodium: 143 mmol/L (ref 135–146)
Total Bilirubin: 0.4 mg/dL (ref 0.2–1.2)
Total Protein: 6.3 g/dL (ref 6.1–8.1)
eGFR: 42 mL/min/{1.73_m2} — ABNORMAL LOW (ref >=60)

## 2021-04-04 LAB — MAGNESIUM: Magnesium: 2.2 mg/dL (ref 1.5–2.5)

## 2021-04-07 NOTE — Progress Notes (Signed)
Lvm for pt to call back and get NV scheduled.

## 2021-04-13 ENCOUNTER — Ambulatory Visit
Admission: RE | Admit: 2021-04-13 | Discharge: 2021-04-13 | Disposition: A | Discharge status: Home / Self Care | Payer: Medicare Other | Source: Ambulatory Visit | Attending: Neurology | Admitting: Neurology

## 2021-04-13 ENCOUNTER — Other Ambulatory Visit: Payer: Self-pay

## 2021-04-13 DIAGNOSIS — Z8774 Personal history of (corrected) congenital malformations of heart and circulatory system: Secondary | ICD-10-CM

## 2021-04-13 DIAGNOSIS — G43009 Migraine without aura, not intractable, without status migrainosus: Secondary | ICD-10-CM

## 2021-04-13 DIAGNOSIS — R569 Unspecified convulsions: Secondary | ICD-10-CM | POA: Diagnosis not present

## 2021-04-13 DIAGNOSIS — G40209 Localization-related (focal) (partial) symptomatic epilepsy and epileptic syndromes with complex partial seizures, not intractable, without status epilepticus: Secondary | ICD-10-CM | POA: Diagnosis not present

## 2021-04-13 DIAGNOSIS — Z8673 Personal history of transient ischemic attack (TIA), and cerebral infarction without residual deficits: Secondary | ICD-10-CM | POA: Diagnosis not present

## 2021-04-13 DIAGNOSIS — G8191 Hemiplegia, unspecified affecting right dominant side: Secondary | ICD-10-CM

## 2021-04-13 IMAGING — MR MR HEAD W/O CM
9 of 10 series · 39 of 48 positions shown · non-contrast
Comparison: [DATE]

CLINICAL DATA: seizure, stroke. MR BRAIN WO; MRA HEAD WO; hx of
[SX]; hx of multiple strokes with right sided hemiparesis

EXAM:
MRI HEAD WITHOUT CONTRAST
TECHNIQUE: Multiplanar, multiecho pulse sequences of the brain and surrounding
structures were obtained without intravenous contrast.

[Series 1: T1 · sagittal · 5.0mm · 0.45mm/px · 2 of 23 slices shown]
[im 1/23]
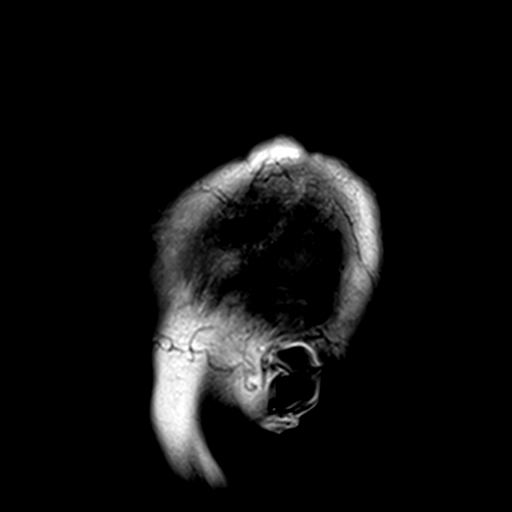
[im 23/23]
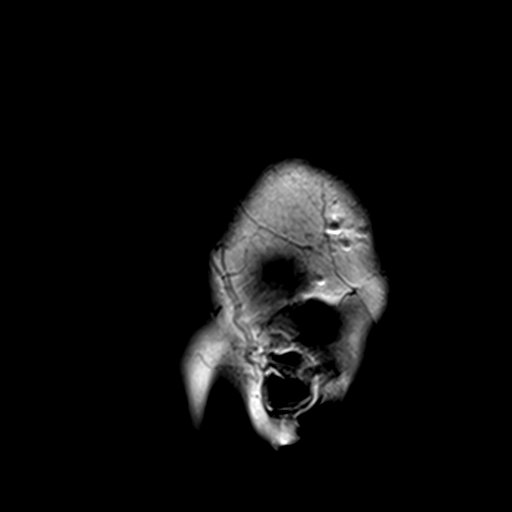

[Series 2: DWI · axial · 3.0mm · 1.80mm/px · z∈[-94,+57]mm · 11 of 104 slices shown (1 of 2)]
[im 1/104]
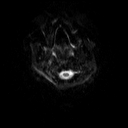
[im 11/104]
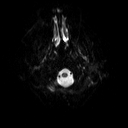
[im 21/104]
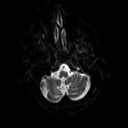
[im 31/104]
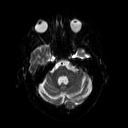
[im 42/104]
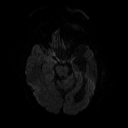
[im 52/104]
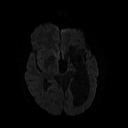
[im 62/104]
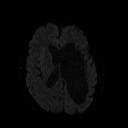
[im 73/104]
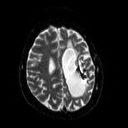
[im 83/104]
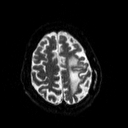
[im 93/104]
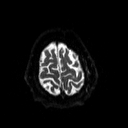
[im 104/104]
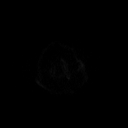

[Series 3: DWI · axial · 3.0mm · 1.80mm/px · z∈[-94,+57]mm · 6 of 52 slices shown (2 of 2)]
[im 1/52]
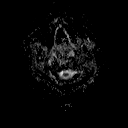
[im 11/52]
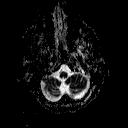
[im 21/52]
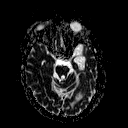
[im 31/52]
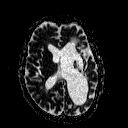
[im 41/52]
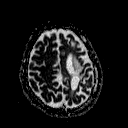
[im 52/52]
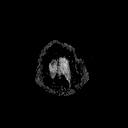

[Series 4: T2 · axial · 5.0mm · 0.60mm/px · z∈[-91,+59]mm · 3 of 24 slices shown (1 of 3)]
[im 1/24]
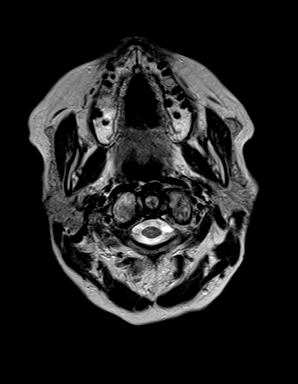
[im 12/24]
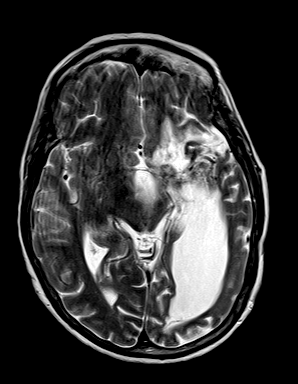
[im 24/24]
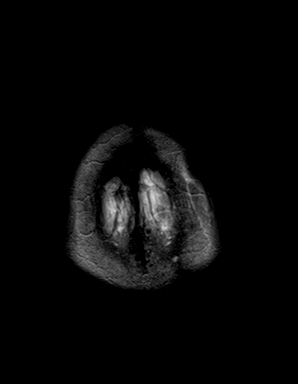

[Series 5: FLAIR · axial · 3.0mm · 0.45mm/px · z∈[-97,+61]mm · 4 of 36 slices shown (1 of 2)]
[im 1/36]
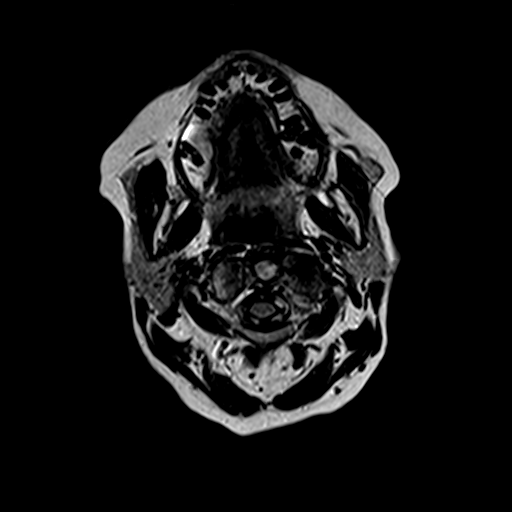
[im 12/36]
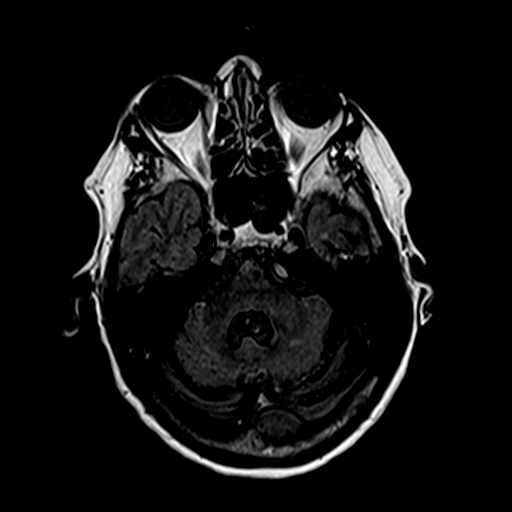
[im 24/36]
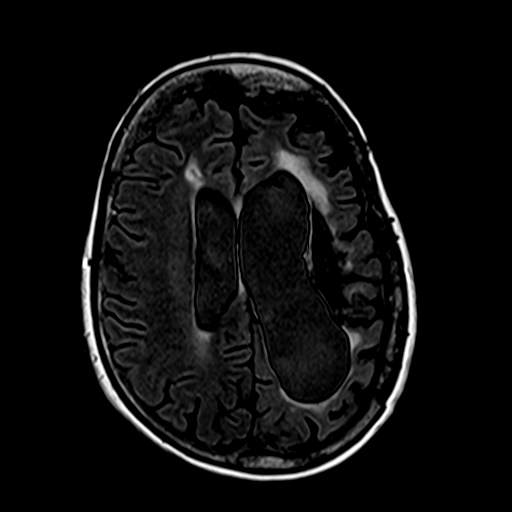
[im 36/36]
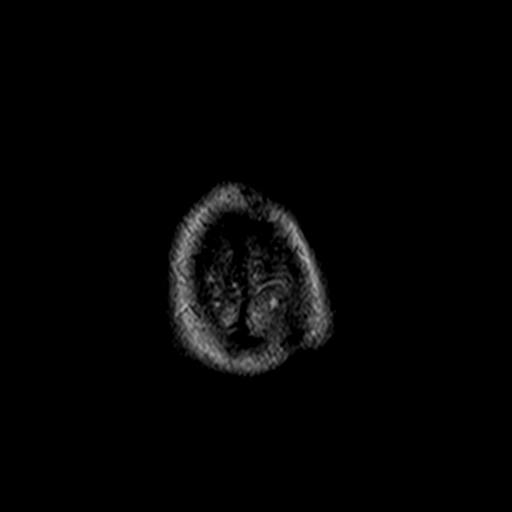

[Series 7: swi_images · axial · 4.0mm · 0.90mm/px · z∈[-93,+58]mm · 4 of 40 slices shown]
[im 1/40]
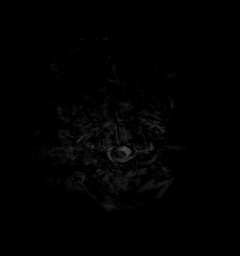
[im 14/40]
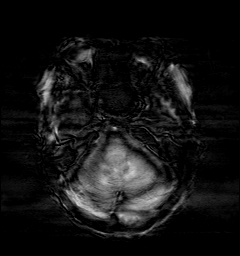
[im 27/40]
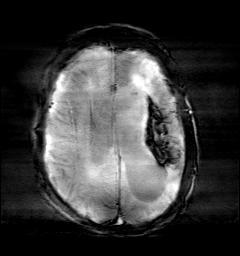
[im 40/40]
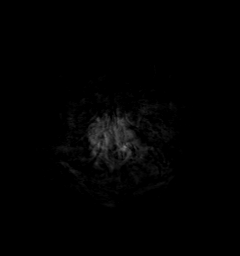

[Series 9: T2 · coronal · 3.0mm · 0.23mm/px · 3 of 30 slices shown (2 of 3)]
[im 1/30]
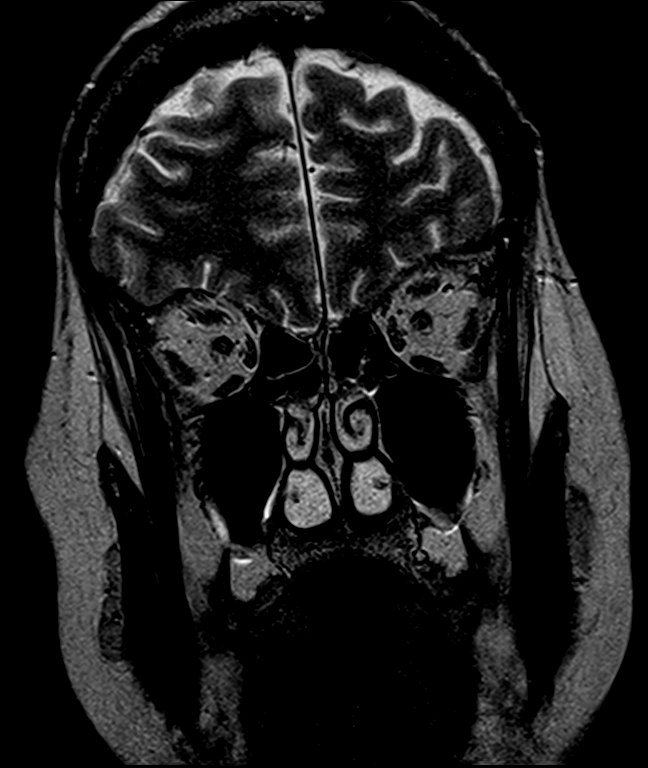
[im 15/30]
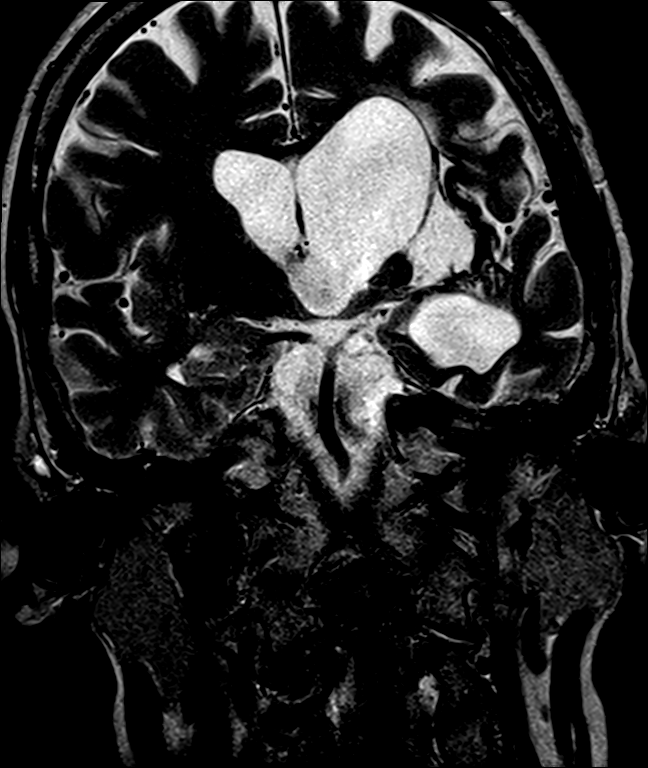
[im 30/30]
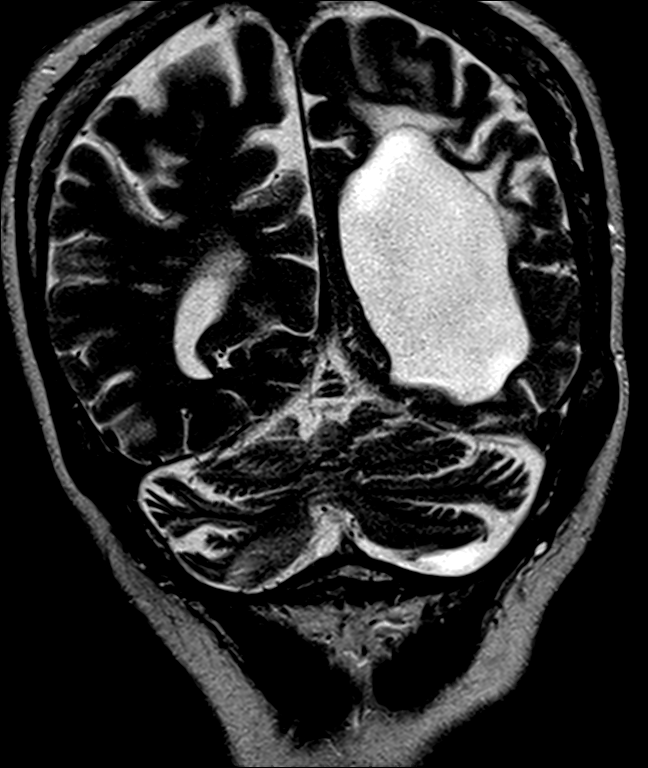

[Series 12: FLAIR · coronal · 3.0mm · 0.70mm/px · 3 of 30 slices shown (2 of 2)]
[im 1/30]
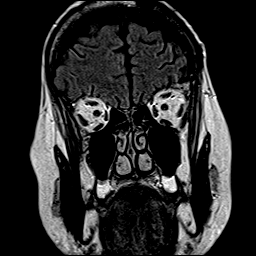
[im 15/30]
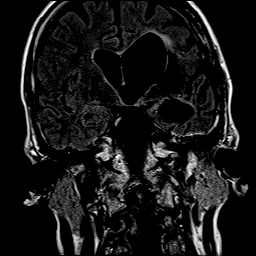
[im 30/30]
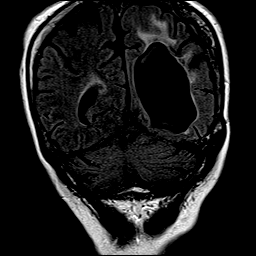

[Series 13: T2 · coronal · 5.0mm · 0.45mm/px · 3 of 28 slices shown (3 of 3)]
[im 1/28]
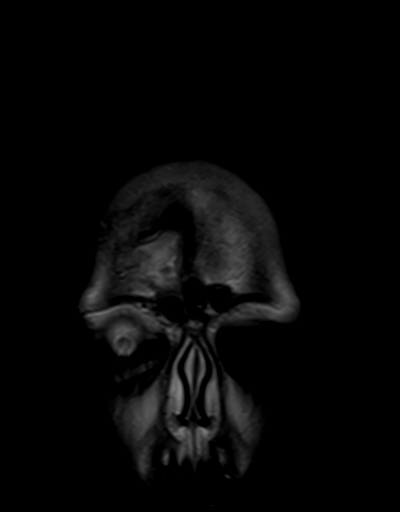
[im 14/28]
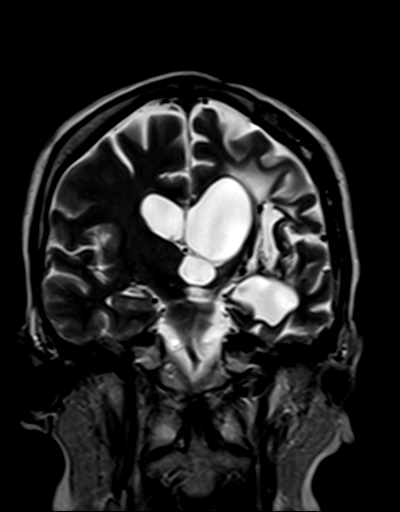
[im 28/28]
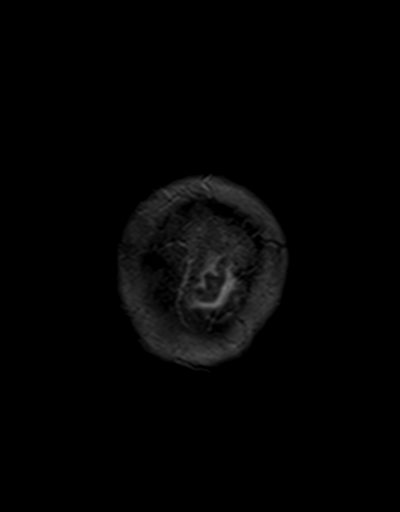

[39 of 48 positions shown; findings below may reference images not displayed]

FINDINGS: Brain: No acute infarct, mass effect or extra-axial collection.
Unchanged large area encephalomalacia in the left hemisphere with ex
vacuo dilatation of the left lateral ventricle. Unchanged areas of
periventricular gliosis bilaterally, but much greater on the left
than the right. Mild vermian atrophy.

Vascular: Major flow voids are preserved.

Skull and upper cervical spine: Remote left-sided craniotomy.

Sinuses/Orbits:No paranasal sinus fluid levels or advanced mucosal
thickening. No mastoid or middle ear effusion. Normal orbits.
IMPRESSION: 1. No acute intracranial abnormality.
2. Unchanged large area of encephalomalacia in the left hemisphere
with ex vacuo dilatation of the left lateral ventricle.

## 2021-04-14 ENCOUNTER — Ambulatory Visit (HOSPITAL_COMMUNITY): Payer: 59

## 2021-04-14 ENCOUNTER — Ambulatory Visit (HOSPITAL_BASED_OUTPATIENT_CLINIC_OR_DEPARTMENT_OTHER): Payer: Medicare Other

## 2021-04-15 ENCOUNTER — Ambulatory Visit (HOSPITAL_BASED_OUTPATIENT_CLINIC_OR_DEPARTMENT_OTHER)
Admission: RE | Admit: 2021-04-15 | Discharge: 2021-04-15 | Disposition: A | Discharge status: Home / Self Care | Payer: Medicare Other | Source: Ambulatory Visit | Attending: Pulmonary Disease | Admitting: Pulmonary Disease

## 2021-04-15 ENCOUNTER — Other Ambulatory Visit: Payer: Self-pay

## 2021-04-15 DIAGNOSIS — R911 Solitary pulmonary nodule: Secondary | ICD-10-CM | POA: Diagnosis not present

## 2021-04-15 DIAGNOSIS — R918 Other nonspecific abnormal finding of lung field: Secondary | ICD-10-CM | POA: Diagnosis not present

## 2021-04-15 DIAGNOSIS — I700 Atherosclerosis of aorta: Secondary | ICD-10-CM | POA: Diagnosis not present

## 2021-04-15 DIAGNOSIS — J439 Emphysema, unspecified: Secondary | ICD-10-CM | POA: Diagnosis not present

## 2021-04-15 IMAGING — CT CT CHEST SUPER D W/O CM
2 of 5 series · 15 of 36 positions shown, 18 images · non-contrast
Comparison: Chest CT [DATE]

CLINICAL DATA: Follow-up right upper lobe lung nodule. Preop for
EMB biopsy.

EXAM:
CT CHEST WITHOUT CONTRAST
TECHNIQUE: Multidetector CT imaging of the chest was performed using thin slice
collimation for electromagnetic bronchoscopy planning purposes,
without intravenous contrast.

[Series 5: coronal · coronal · 0.57mm/px · 3 of 104 slices shown]
[im 21/104  lung]
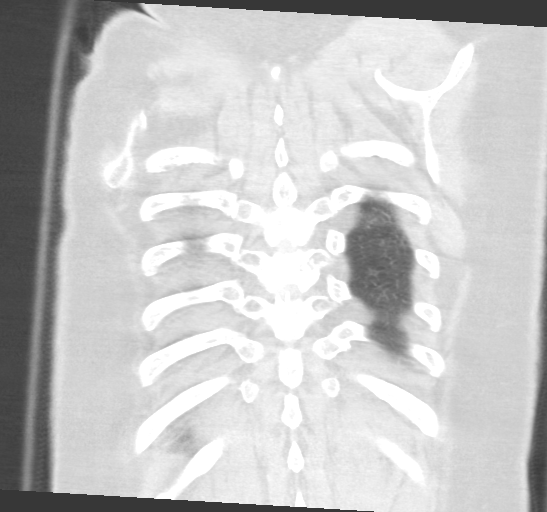
[im 42/104  lung]
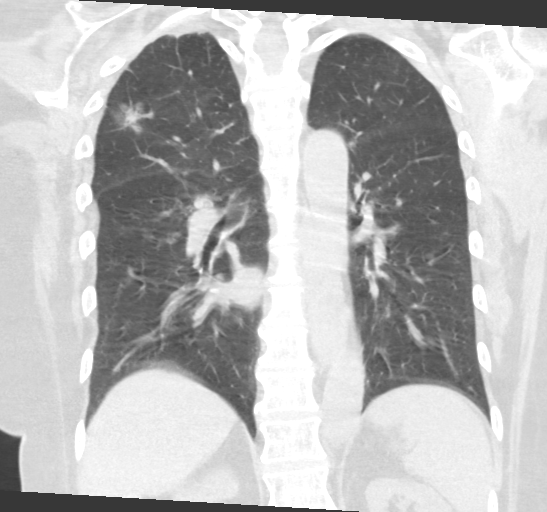
[im 62/104  lung]
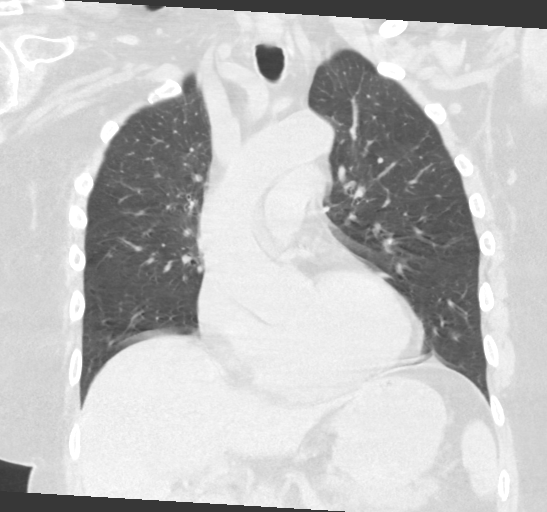

[Series 7: super d · axial · 0.60mm/px · z∈[+1214,+1457]mm · 12 of 352 slices shown, 15 images]
[im 24/352  mediastinal]
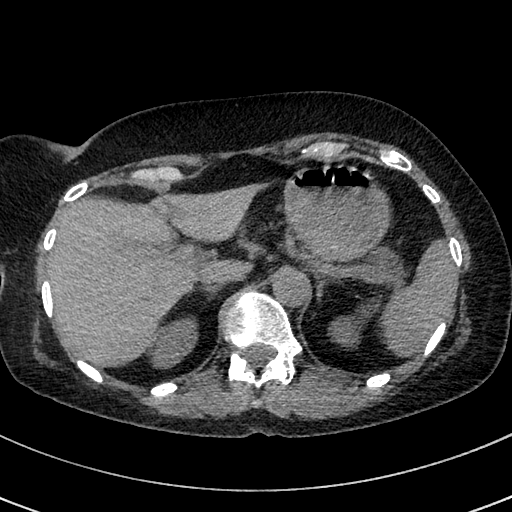
[im 24/352  lung]
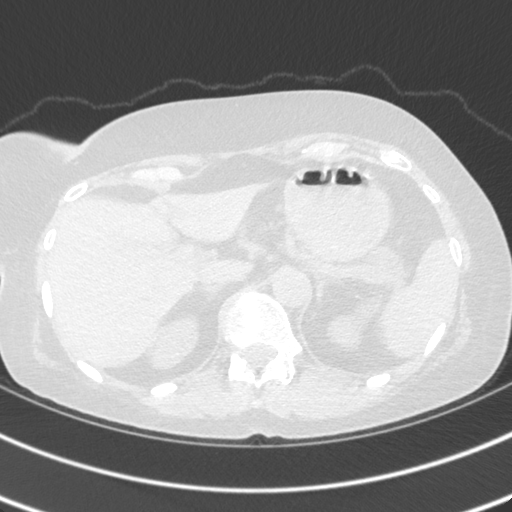
[im 47/352  lung]
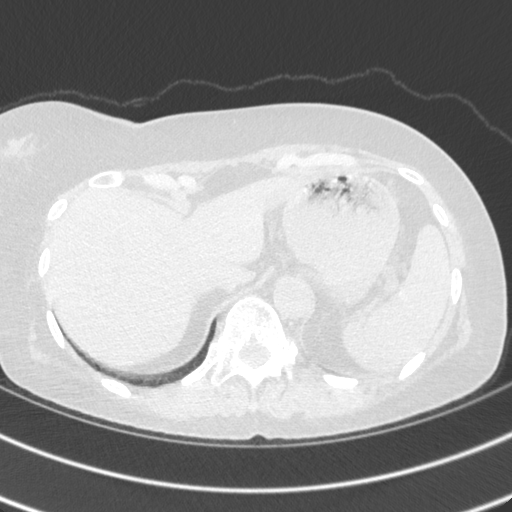
[im 71/352  lung]
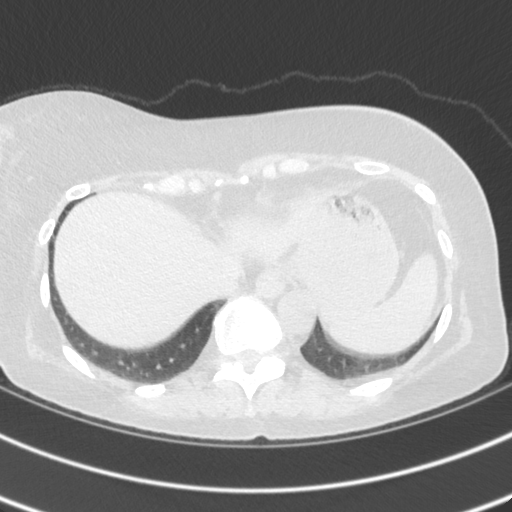
[im 118/352  lung]
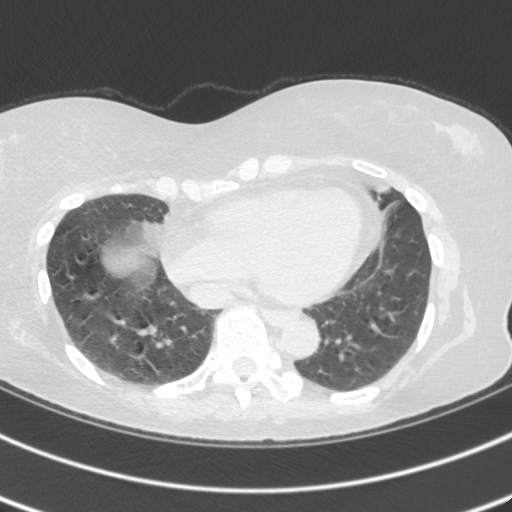
[im 141/352  mediastinal]
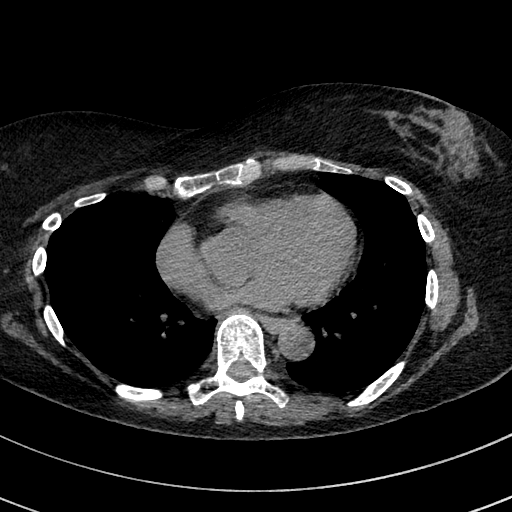
[im 141/352  lung]
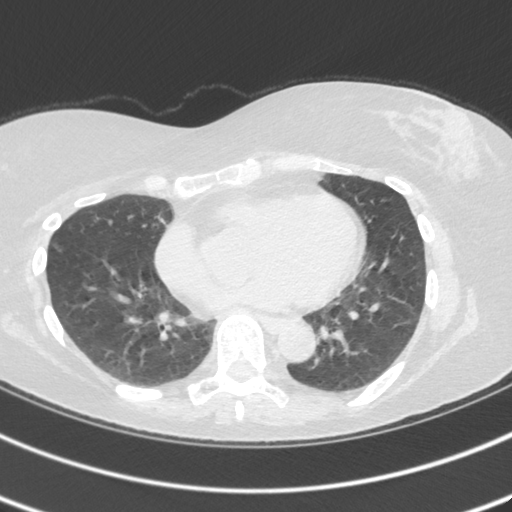
[im 164/352  lung]
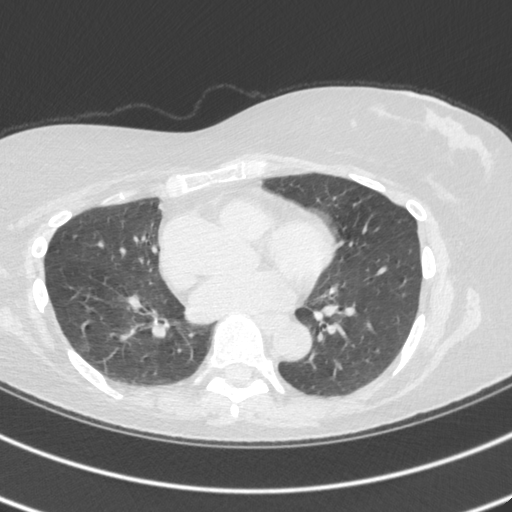
[im 188/352  lung]
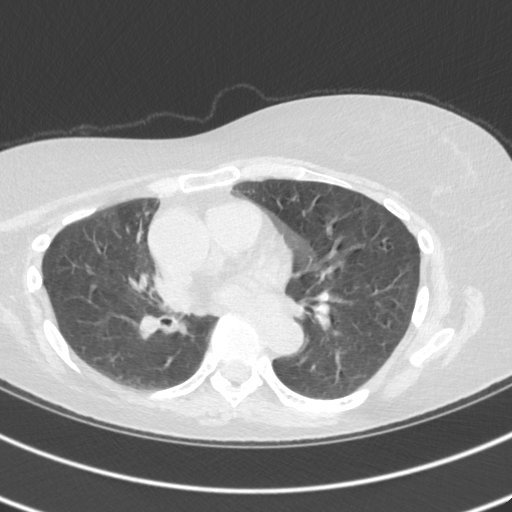
[im 211/352  lung]
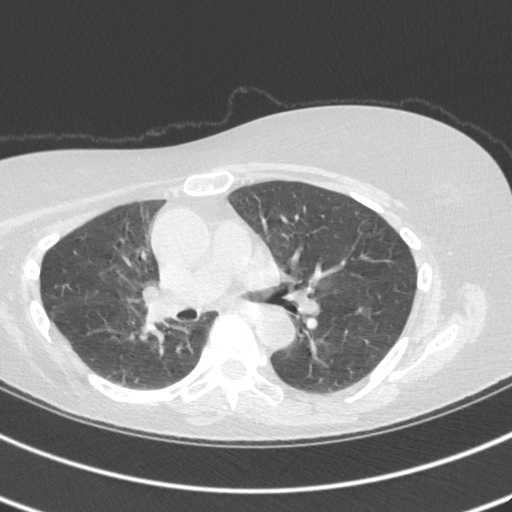
[im 235/352  mediastinal]
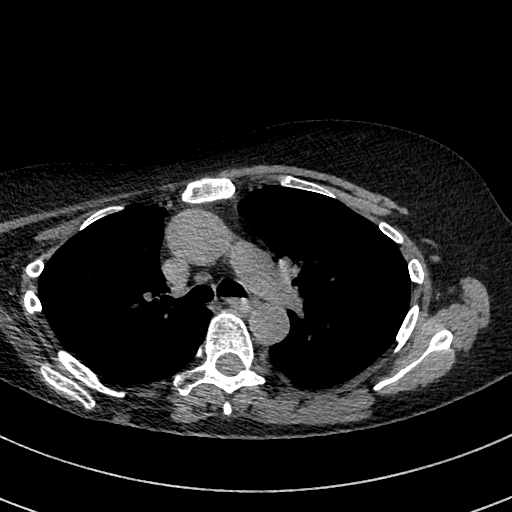
[im 235/352  lung]
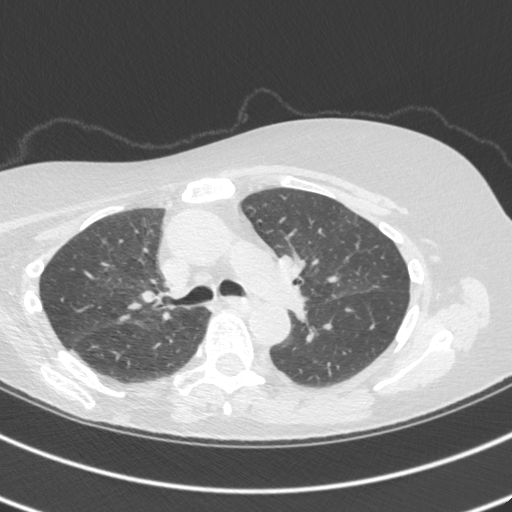
[im 281/352  lung]
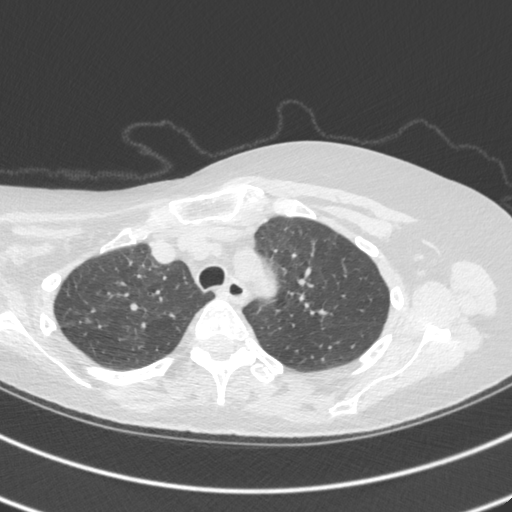
[im 305/352  lung]
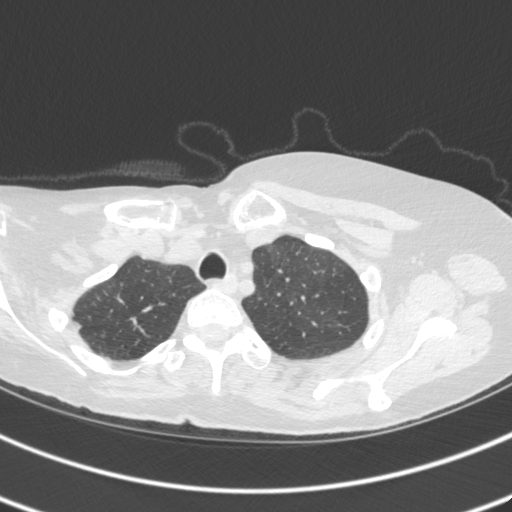
[im 328/352  lung]
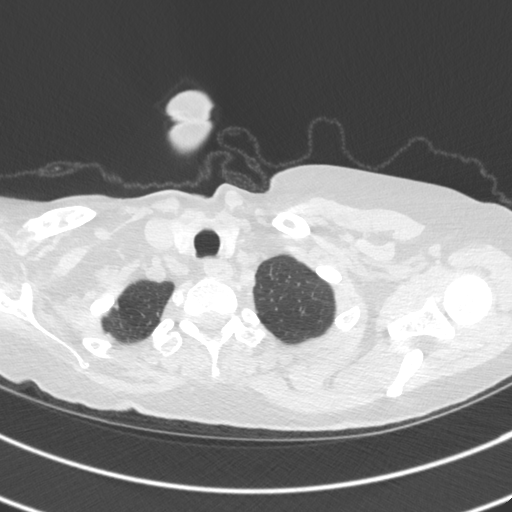

[15 of 36 positions shown; findings below may reference images not displayed]

FINDINGS: Cardiovascular: The heart is within normal limits in size and
stable. No pericardial effusion. Stable mild tortuosity, ectasia and
calcification of the thoracic aorta. Stable scattered coronary
artery calcifications.

Mediastinum/Nodes: No mediastinal or hilar mass or adenopathy. The
esophagus is grossly normal.

Lungs/Pleura: Part solid right upper lobe lung lesion is grossly
stable. Mixed ground-glass and cystic type changes surround a more
central solid portion. The entire lesion measures approximately
x 1.2 cm. The more solid central portion measures 7 mm.

A few small scattered sub 4 mm nodules are stable. No new pulmonary
lesions or acute pulmonary findings. No pleural effusion.

Upper Abdomen: No significant upper abdominal findings. Bilateral
renal calculi are noted. Stable aortic calcifications. No hepatic or
adrenal gland lesions.

Musculoskeletal: No breast masses, supraclavicular or axillary
adenopathy. The bony thorax is intact.
IMPRESSION: 1. Stable part solid right upper lobe lung lesion.
2. No mediastinal or hilar mass or adenopathy.
3. Stable scattered sub 4 mm pulmonary nodules.
4. Stable atherosclerotic calcifications involving the aorta and
coronary arteries.
5. Bilateral renal calculi.

Aortic Atherosclerosis ([WB]-[WB]) and Emphysema ([WB]-[WB]).

## 2021-04-17 ENCOUNTER — Other Ambulatory Visit: Payer: Self-pay | Admitting: Pulmonary Disease

## 2021-04-17 LAB — SARS CORONAVIRUS 2 (TAT 6-24 HRS): SARS Coronavirus 2: NEGATIVE

## 2021-04-20 ENCOUNTER — Telehealth: Payer: Self-pay

## 2021-04-20 NOTE — Telephone Encounter (Signed)
Pt called no answer per DPR left a voice mail MRI/MRA brain did not show any new changes

## 2021-04-20 NOTE — Telephone Encounter (Signed)
-----   Message from Cameron Sprang, MD sent at 04/16/2021  9:51 AM EST ----- Pls let her know the MRI/MRA brain did not show any new changes. How is she feeling? Thanks

## 2021-04-20 NOTE — Progress Notes (Signed)
Called security back at [facility] and he stated that pt said her phone wasn't working. I called pt back and left a voicemail with pre-op instructions.

## 2021-04-20 NOTE — Progress Notes (Signed)
I have attempted to call pt several times today, we've left messages for a return call. Now when called, phone goes straight to voicemail. She lives in the assisted living of [facility]. I called the main number and told the security person that I was concerned about patient. He stated he will have someone go check on her.

## 2021-04-21 ENCOUNTER — Encounter (HOSPITAL_COMMUNITY)
Admission: RE | Disposition: A | Discharge status: Home / Self Care | Payer: Self-pay | Source: Home / Self Care | Attending: Pulmonary Disease

## 2021-04-21 ENCOUNTER — Other Ambulatory Visit: Payer: Self-pay

## 2021-04-21 ENCOUNTER — Ambulatory Visit (HOSPITAL_COMMUNITY): Payer: Medicare Other

## 2021-04-21 ENCOUNTER — Encounter (HOSPITAL_COMMUNITY): Payer: Self-pay | Admitting: Pulmonary Disease

## 2021-04-21 ENCOUNTER — Ambulatory Visit (HOSPITAL_COMMUNITY)
Admission: RE | Admit: 2021-04-21 | Discharge: 2021-04-21 | Disposition: A | Discharge status: Home / Self Care | Payer: Medicare Other | Attending: Pulmonary Disease | Admitting: Pulmonary Disease

## 2021-04-21 ENCOUNTER — Ambulatory Visit (HOSPITAL_COMMUNITY): Payer: Medicare Other | Admitting: Physician Assistant

## 2021-04-21 DIAGNOSIS — R911 Solitary pulmonary nodule: Secondary | ICD-10-CM

## 2021-04-21 DIAGNOSIS — Z8669 Personal history of other diseases of the nervous system and sense organs: Secondary | ICD-10-CM | POA: Diagnosis not present

## 2021-04-21 DIAGNOSIS — Z8673 Personal history of transient ischemic attack (TIA), and cerebral infarction without residual deficits: Secondary | ICD-10-CM | POA: Diagnosis not present

## 2021-04-21 DIAGNOSIS — Z7409 Other reduced mobility: Secondary | ICD-10-CM | POA: Insufficient documentation

## 2021-04-21 DIAGNOSIS — Z9889 Other specified postprocedural states: Secondary | ICD-10-CM

## 2021-04-21 DIAGNOSIS — Z419 Encounter for procedure for purposes other than remedying health state, unspecified: Secondary | ICD-10-CM

## 2021-04-21 DIAGNOSIS — Z7722 Contact with and (suspected) exposure to environmental tobacco smoke (acute) (chronic): Secondary | ICD-10-CM | POA: Insufficient documentation

## 2021-04-21 DIAGNOSIS — I69351 Hemiplegia and hemiparesis following cerebral infarction affecting right dominant side: Secondary | ICD-10-CM | POA: Insufficient documentation

## 2021-04-21 DIAGNOSIS — G40909 Epilepsy, unspecified, not intractable, without status epilepticus: Secondary | ICD-10-CM | POA: Diagnosis not present

## 2021-04-21 DIAGNOSIS — Z8616 Personal history of COVID-19: Secondary | ICD-10-CM | POA: Diagnosis not present

## 2021-04-21 DIAGNOSIS — R918 Other nonspecific abnormal finding of lung field: Secondary | ICD-10-CM | POA: Diagnosis not present

## 2021-04-21 DIAGNOSIS — E559 Vitamin D deficiency, unspecified: Secondary | ICD-10-CM | POA: Diagnosis not present

## 2021-04-21 DIAGNOSIS — I10 Essential (primary) hypertension: Secondary | ICD-10-CM | POA: Diagnosis not present

## 2021-04-21 DIAGNOSIS — E782 Mixed hyperlipidemia: Secondary | ICD-10-CM | POA: Diagnosis not present

## 2021-04-21 HISTORY — PX: FIDUCIAL MARKER PLACEMENT: SHX6858

## 2021-04-21 HISTORY — PX: BRONCHIAL WASHINGS: SHX5105

## 2021-04-21 HISTORY — PX: BRONCHIAL BRUSHINGS: SHX5108

## 2021-04-21 HISTORY — PX: BRONCHIAL BIOPSY: SHX5109

## 2021-04-21 HISTORY — PX: VIDEO BRONCHOSCOPY WITH RADIAL ENDOBRONCHIAL ULTRASOUND: SHX6849

## 2021-04-21 SURGERY — BRONCHOSCOPY, WITH BIOPSY USING ELECTROMAGNETIC NAVIGATION
Anesthesia: General | Laterality: Right

## 2021-04-21 MED ORDER — ACETAMINOPHEN 10 MG/ML IV SOLN: 1000.0000 mg | Freq: Once | INTRAVENOUS | Status: DC | PRN

## 2021-04-21 MED ORDER — ROCURONIUM BROMIDE 10 MG/ML (PF) SYRINGE
PREFILLED_SYRINGE | INTRAVENOUS | Status: DC | PRN
  Administered 2021-04-21: 40 mg via INTRAVENOUS

## 2021-04-21 MED ORDER — AMISULPRIDE (ANTIEMETIC) 5 MG/2ML IV SOLN
10.0000 mg | Freq: Once | INTRAVENOUS | Status: DC | PRN
  Filled 2021-04-21: qty 4

## 2021-04-21 MED ORDER — FENTANYL CITRATE (PF) 100 MCG/2ML IJ SOLN: 25.0000 ug | INTRAMUSCULAR | Status: DC | PRN

## 2021-04-21 MED ORDER — EPHEDRINE SULFATE-NACL 50-0.9 MG/10ML-% IV SOSY
PREFILLED_SYRINGE | INTRAVENOUS | Status: DC | PRN
  Administered 2021-04-21 (×2): 5 mg via INTRAVENOUS

## 2021-04-21 MED ORDER — ONDANSETRON HCL 4 MG/2ML IJ SOLN: 4.0000 mg | Freq: Once | INTRAMUSCULAR | Status: DC | PRN

## 2021-04-21 MED ORDER — DEXAMETHASONE SODIUM PHOSPHATE 10 MG/ML IJ SOLN
INTRAMUSCULAR | Status: DC | PRN
  Administered 2021-04-21: 5 mg via INTRAVENOUS

## 2021-04-21 MED ORDER — FENTANYL CITRATE (PF) 100 MCG/2ML IJ SOLN
INTRAMUSCULAR | Status: DC | PRN
  Administered 2021-04-21: 100 ug via INTRAVENOUS

## 2021-04-21 MED ORDER — PROPOFOL 10 MG/ML IV BOLUS
INTRAVENOUS | Status: DC | PRN
  Administered 2021-04-21: 100 mg via INTRAVENOUS

## 2021-04-21 MED ORDER — CHLORHEXIDINE GLUCONATE 0.12 % MT SOLN
15.0000 mL | Freq: Once | OROMUCOSAL | Status: AC
  Administered 2021-04-21: 08:00:00 15 mL via OROMUCOSAL
  Filled 2021-04-21: qty 15

## 2021-04-21 MED ORDER — SUGAMMADEX SODIUM 200 MG/2ML IV SOLN
INTRAVENOUS | Status: DC | PRN
  Administered 2021-04-21: 200 mg via INTRAVENOUS

## 2021-04-21 MED ORDER — ONDANSETRON HCL 4 MG/2ML IJ SOLN
INTRAMUSCULAR | Status: DC | PRN
  Administered 2021-04-21: 4 mg via INTRAVENOUS

## 2021-04-21 MED ORDER — LACTATED RINGERS IV SOLN: INTRAVENOUS | Status: DC

## 2021-04-21 MED ORDER — LIDOCAINE 2% (20 MG/ML) 5 ML SYRINGE
INTRAMUSCULAR | Status: DC | PRN
  Administered 2021-04-21: 60 mg via INTRAVENOUS

## 2021-04-21 SURGICAL SUPPLY — 1 items: superlock fiducial marker ×2 IMPLANT

## 2021-04-21 NOTE — H&P (Signed)
Synopsis: Referred in Nov 2022 for lung nodule by Unk Pinto, MD   Subjective:    PATIENT ID: Tonya Thompson GENDER: female DOB: 1955/07/31, MRN: [id number]      Chief Complaint  Patient presents with   Consult      This is a 65 year old female, past medical history of multiple CVAs, history of COVID-19, gastroesophageal reflux, seizure disorder she has had a total of at least 5 or 6 strokes most of them were when she was younger.  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  She lives in assisted living at this point.  She originally had a CT scan in April 2022 which revealed a right upper lobe subsolid lesion.  She had subsequent CT scan follow-up which revealed persistence of this lesion last month.  She was referred from her primary care provider for evaluation of this right upper lobe nodule to discuss next steps.  She has secondhand smoke exposure from her mother when she lived with her several years ago.  Overall lifelong non-smoker.   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)      5 total so far (age [phone])   Ureteral stone 05/10/2019    has multiple kidney stones noted from Urologist   Uses roller walker             Family History  Problem Relation Age of Onset   Heart disease Mother     Heart failure Mother     Heart attack Mother     Diabetes Father     Hyperlipidemia Father     Dementia Father     Prostate  cancer Father     Melanoma Father     Colon polyps Father     Hyperlipidemia Paternal Uncle     Hypertension Paternal Uncle     Dementia Paternal Uncle     Heart attack Maternal Grandfather     Stroke Paternal Grandmother     CAD Maternal Uncle     COPD Maternal Uncle          smoker   Breast cancer Neg Hx     Colon cancer Neg Hx     Esophageal cancer Neg Hx     Stomach cancer Neg Hx     Rectal cancer Neg Hx             Past Surgical History:  Procedure Laterality Date   BRAIN SURGERY   09/19/1978    at [facility], Dr. Leida Lauth   COLONOSCOPY   2017    [place], [place]   CYSTOSCOPY/URETEROSCOPY/HOLMIUM LASER/STENT PLACEMENT Left 05/10/2019    Procedure: CYSTOSCOPY/RETROGRADE/URETEROSCOPY/HOLMIUM LASER/STENT PLACEMENT;  Surgeon: Raynelle Bring, MD;  Location: [facility];  Service: Urology;  Laterality: Left;   FOOT SURGERY Right 10/1985  ankle fusion, at [facility], Dr. Lorelee Cover   POLYPECTOMY   2017    multiple adenomas   TUBAL LIGATION   1981   WISDOM TOOTH EXTRACTION          Social History         Socioeconomic History   Marital status: Single      Spouse name: Not on file   Number of children: Not on file   Years of education: Not on file   Highest education level: Not on file  Occupational History   Not on file  Tobacco Use   Smoking status: Never      Passive exposure: Past   Smokeless tobacco: Never  Vaping Use   Vaping Use: Never used  Substance and Sexual Activity   Alcohol use: No   Drug use: No   Sexual activity: Not Currently      Birth control/protection: None      Comment: 1st intercourse 65 yo-Fewer than 5 partners  Other Topics Concern   Not on file  Social History Narrative    Right handed until 3rd grade     Left handed    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     Social Determinants of Health    Financial Resource Strain: Not on file  Food Insecurity: Not on file  Transportation Needs: Not on file  Physical Activity: Not on file  Stress: Not on file   Social Connections: Not on file  Intimate Partner Violence: Not on file           Allergies  Allergen Reactions   Aspirin Other (See Comments)      CONTRAINDICATED DUE TO HISTORY OF BLEEDING IN BRAIN   Cheese        Cant take due to history of severe migraines   Chocolate        Cant take due to history of severe migraines   Coffee Flavor        Cant take due to history of severe migraines   Other         Nuts - Cant take due to history of severe migraines   Red Wine Complex [Germanium]        Cant take due to history of severe migraines            Outpatient Medications Prior to Visit  Medication Sig Dispense Refill   acetaminophen (TYLENOL) 325 MG tablet Take 650 mg by mouth every 4 (four) hours as needed.       baclofen (LIORESAL) 10 MG tablet Take 1 tablet up to four times daily as needed (Patient taking differently: Take 10 mg by mouth daily. Take 1 tablet up to four times daily as needed) 120 each 11   calcium carbonate (TUMS - DOSED IN MG ELEMENTAL CALCIUM) 500 MG chewable tablet Chew 1 tablet by mouth daily.       Calcium Citrate-Vitamin D (CALCIUM + D PO) Take 1 tablet by mouth daily.       divalproex (DEPAKOTE ER) 500 MG 24 hr tablet Take 2 tablets every night 60 tablet 11   fexofenadine (ALLEGRA) 180 MG tablet Take 180 mg by mouth daily.       Multiple Vitamins-Minerals (MULTIVITAMIN WITH MINERALS) tablet Take 1 tablet by mouth daily.       Omega-3 Fatty Acids (FISH OIL PO) Take 1 capsule by mouth daily.       Propylene Glycol (SYSTANE BALANCE) 0.6 % SOLN Place 1 drop into  both eyes 2 (two) times daily.       topiramate (TOPAMAX) 200 MG tablet Take 300 mg by mouth 2 (two) times daily.       Vitamin D3 (VITAMIN D) 25 MCG tablet Take 1,000 Units by mouth daily.       guaiFENesin (MUCINEX) 600 MG 12 hr tablet Take 600 mg by mouth 2 (two) times daily as needed. (Patient not taking: Reported on 03/04/2021)       zinc oxide 20 % ointment Apply 1 application topically as  needed for irritation. (Patient not taking: Reported on 03/04/2021)        No facility-administered medications prior to visit.      Review of Systems  Constitutional:  Negative for chills, fever, malaise/fatigue and weight loss.  HENT:  Negative for hearing loss, sore throat and tinnitus.   Eyes:  Negative for blurred vision and double vision.  Respiratory:  Negative for cough, hemoptysis, sputum production, shortness of breath, wheezing and stridor.   Cardiovascular:  Negative for chest pain, palpitations, orthopnea, leg swelling and PND.  Gastrointestinal:  Negative for abdominal pain, constipation, diarrhea, heartburn, nausea and vomiting.  Genitourinary:  Negative for dysuria, hematuria and urgency.  Musculoskeletal:  Negative for joint pain and myalgias.  Skin:  Negative for itching and rash.  Neurological:  Positive for weakness. Negative for dizziness, tingling and headaches.  Endo/Heme/Allergies:  Negative for environmental allergies. Does not bruise/bleed easily.  Psychiatric/Behavioral:  Negative for depression. The patient is not nervous/anxious and does not have insomnia.   All other systems reviewed and are negative.    Objective:   General appearance: 65 y.o., female, NAD, conversant  Eyes: anicteric sclerae, moist conjunctivae; no lid-lag; PERRLA, tracking appropriately HENT: NCAT; oropharynx, MMM, no mucosal ulcerations; normal hard and soft palate Neck: Trachea midline; FROM, supple, lymphadenopathy, no JVD Lungs: CTAB, no crackles, no wheeze, with normal respiratory effort and no intercostal retractions CV: RRR, S1, S2, no MRGs  Abdomen: Soft, non-tender; non-distended, BS present  Extremities: No peripheral edema, radial and DP pulses present bilaterally  Skin: Normal temperature, turgor and texture; no rash Psych: Appropriate affect Neuro: Alert and oriented to person and place, no focal deficit     BP (!) 95/53    Pulse 69    Temp 98.6 F (37 C) (Oral)    Resp  18    Ht 5\' 5"  (1.651 m)    Wt 67.1 kg    SpO2 94%    BMI 24.61 kg/m         CBC Labs (Brief)          Component Value Date/Time    WBC 6.2 01/27/2021 1731    RBC 4.20 01/27/2021 1731    HGB 14.0 01/27/2021 1731    HCT 42.3 01/27/2021 1731    PLT 176 01/27/2021 1731    MCV 100.7 (H) 01/27/2021 1731    MCH 33.3 (H) 01/27/2021 1731    MCHC 33.1 01/27/2021 1731    RDW 12.5 01/27/2021 1731    LYMPHSABS 2,046 01/27/2021 1731    MONOABS 0.8 07/28/2020 1258    EOSABS 99 01/27/2021 1731    BASOSABS 31 01/27/2021 1731          Chest Imaging: 02/03/2021 CT chest: Persistent subsolid pulmonary nodule within the right upper lobe concerning for possible stage I malignancy. The patient's images have been independently reviewed by me.    Super D Ct reviewed  The patient's images have been independently reviewed by me.  Pulmonary Functions Testing Results: No flowsheet data found.   FeNO:    Pathology:    Echocardiogram:    Heart Catheterization:     Assessment & Plan:        ICD-10-CM    1. Right upper lobe pulmonary nodule  R91.1       2. Lung nodule  R91.1 Procedural/ Surgical Case Request: ROBOTIC ASSISTED NAVIGATIONAL BRONCHOSCOPY      Ambulatory referral to Pulmonology      CT Super D Chest Wo Contrast     3. History of CVA (cerebrovascular accident)  Z86.73           Discussion:   This is a 65 year old female, past medical history of multiple strokes, she does have some debility because of this, does have a rolling walker mobility issues and lives in an assisted living.  She does have a seizure disorder as well.  She has a persistent right upper lobe subsolid lesion within the chest concerning for primary malignancy.  We discussed potential watchful waiting to see if there is any evolution of the nodule versus going ahead to proceed with biopsy.  Plan:  Planned bronchoscopy today  Discussed risks benefits and alternatives  Patient is agreeable to proceed.    Garner Nash, DO [facility] 04/21/2021 8:46 AM

## 2021-04-21 NOTE — Anesthesia Postprocedure Evaluation (Signed)
Anesthesia Post Note  Patient: Leotha Westermeyer Minassian  Procedure(s) Performed: ROBOTIC ASSISTED NAVIGATIONAL BRONCHOSCOPY (Right) BRONCHIAL BRUSHINGS BRONCHIAL BIOPSIES RADIAL ENDOBRONCHIAL ULTRASOUND FIDUCIAL MARKER PLACEMENT BRONCHIAL WASHINGS     Patient location during evaluation: PACU Anesthesia Type: General Level of consciousness: awake Pain management: pain level controlled Vital Signs Assessment: post-procedure vital signs reviewed and stable Respiratory status: spontaneous breathing, nonlabored ventilation, respiratory function stable and patient connected to nasal cannula oxygen Cardiovascular status: blood pressure returned to baseline and stable Postop Assessment: no apparent nausea or vomiting Anesthetic complications: no   No notable events documented.  Last Vitals:  Vitals:   04/21/21 1025 04/21/21 1040  BP: 114/67 112/67  Pulse: 77 76  Resp: 13 15  Temp:  36.9 C  SpO2: 97% 99%    Last Pain:  Vitals:   04/21/21 1040  TempSrc:   PainSc: 0-No pain                 Nykeem Citro P Saavi Mceachron

## 2021-04-21 NOTE — Anesthesia Preprocedure Evaluation (Addendum)
Anesthesia Evaluation  Patient identified by MRN, date of birth, ID band Patient awake    Reviewed: Allergy & Precautions, NPO status , Patient's Chart, lab work & pertinent test results  Airway Mallampati: II  TM Distance: >3 FB Neck ROM: Full    Dental no notable dental hx.    Pulmonary    Pulmonary exam normal breath sounds clear to auscultation       Cardiovascular hypertension, Normal cardiovascular exam Rhythm:Regular Rate:Normal     Neuro/Psych  Headaches, Seizures -, Well Controlled,  AVM (arteriovenous malformation) brain Right hemiparesis CVA, Residual Symptoms negative psych ROS   GI/Hepatic Neg liver ROS, GERD  ,  Endo/Other  negative endocrine ROS  Renal/GU Renal disease     Musculoskeletal  (+) Arthritis , Ambulates with walker   Abdominal   Peds  Hematology negative hematology ROS (+)   Anesthesia Other Findings Lung nodule  Reproductive/Obstetrics                            Anesthesia Physical Anesthesia Plan  ASA: 3  Anesthesia Plan: General   Post-op Pain Management:    Induction: Intravenous  PONV Risk Score and Plan: 3 and Ondansetron, Dexamethasone and Treatment may vary due to age or medical condition  Airway Management Planned: Oral ETT  Additional Equipment:   Intra-op Plan:   Post-operative Plan: Extubation in OR  Informed Consent: I have reviewed the patients History and Physical, chart, labs and discussed the procedure including the risks, benefits and alternatives for the proposed anesthesia with the patient or authorized representative who has indicated his/her understanding and acceptance.     Dental advisory given  Plan Discussed with: CRNA  Anesthesia Plan Comments:       Anesthesia Quick Evaluation

## 2021-04-21 NOTE — Interval H&P Note (Signed)
History and Physical Interval Note:  04/21/2021 8:47 AM  Tonya Thompson  has presented today for surgery, with the diagnosis of Lung nodule.  The various methods of treatment have been discussed with the patient and family. After consideration of risks, benefits and other options for treatment, the patient has consented to  Procedure(s) with comments: ROBOTIC ASSISTED NAVIGATIONAL BRONCHOSCOPY (Right) - ION w/ CIOS w/ Fiducial placement as a surgical intervention.  The patient's history has been reviewed, patient examined, no change in status, stable for surgery.  I have reviewed the patient's chart and labs.  Questions were answered to the patient's satisfaction.     [place]

## 2021-04-21 NOTE — Discharge Instructions (Signed)
Flexible Bronchoscopy, Care After This sheet gives you information about how to care for yourself after your test. Your doctor may also give you more specific instructions. If you have problems or questions, contact your doctor. Follow these instructions at home: Eating and drinking Do not eat or drink anything (not even water) for 2 hours after your test, or until your numbing medicine (local anesthetic) wears off. When your numbness is gone and your cough and gag reflexes have come back, you may: Eat only soft foods. Slowly drink liquids. The day after the test, go back to your normal diet. Driving Do not drive for 24 hours if you were given a medicine to help you relax (sedative). Do not drive or use heavy machinery while taking prescription pain medicine. General instructions  Take over-the-counter and prescription medicines only as told by your doctor. Return to your normal activities as told. Ask what activities are safe for you. Do not use any products that have nicotine or tobacco in them. This includes cigarettes and e-cigarettes. If you need help quitting, ask your doctor. Keep all follow-up visits as told by your doctor. This is important. It is very important if you had a tissue sample (biopsy) taken. Get help right away if: You have shortness of breath that gets worse. You get light-headed. You feel like you are going to pass out (faint). You have chest pain. You cough up: More than a little blood. More blood than before. Summary Do not eat or drink anything (not even water) for 2 hours after your test, or until your numbing medicine wears off. Do not use cigarettes. Do not use e-cigarettes. Get help right away if you have chest pain.  This information is not intended to replace advice given to you by your health care provider. Make sure you discuss any questions you have with your health care provider. Document Released: 02/14/2009 Document Revised: 04/01/2017 Document  Reviewed: 05/07/2016 Elsevier Patient Education  2020 [employer].

## 2021-04-21 NOTE — Anesthesia Procedure Notes (Signed)
Procedure Name: Intubation Date/Time: 04/21/2021 9:06 AM Performed by: Colin Benton, CRNA Pre-anesthesia Checklist: Patient identified, Emergency Drugs available, Suction available and Patient being monitored Patient Re-evaluated:Patient Re-evaluated prior to induction Oxygen Delivery Method: Circle system utilized Preoxygenation: Pre-oxygenation with 100% oxygen Induction Type: IV induction Ventilation: Mask ventilation without difficulty Laryngoscope Size: Miller and 2 Grade View: Grade I Tube type: Oral Tube size: 8.5 mm Number of attempts: 1 Airway Equipment and Method: Stylet Placement Confirmation: ETT inserted through vocal cords under direct vision, positive ETCO2 and breath sounds checked- equal and bilateral Secured at: 22 cm Tube secured with: Tape Dental Injury: Teeth and Oropharynx as per pre-operative assessment

## 2021-04-21 NOTE — Op Note (Signed)
Video Bronchoscopy with Robotic Assisted Bronchoscopic Navigation   Date of Operation: 04/21/2021   Pre-op Diagnosis: Right upper lobe lung nodule, sub solid lesion  Post-op Diagnosis: Right upper lobe lung nodule, sub solid lesion  Surgeon: Garner Nash, DO   Assistants: none   Anesthesia: General endotracheal anesthesia  Operation: Flexible video fiberoptic bronchoscopy with robotic assistance and biopsies.  Estimated Blood Loss: Minimal  Complications: None  Indications and History: Tonya Thompson is a 65 y.o. female with history of right upper lobe lung nodule. The risks, benefits, complications, treatment options and expected outcomes were discussed with the patient.  The possibilities of pneumothorax, pneumonia, reaction to medication, pulmonary aspiration, perforation of a viscus, bleeding, failure to diagnose a condition and creating a complication requiring transfusion or operation were discussed with the patient who freely signed the consent.    Description of Procedure: The patient was seen in the Preoperative Area, was examined and was deemed appropriate to proceed.  The patient was taken to [facility] endoscopy room 3, identified as Tonya Thompson and the procedure verified as Flexible Video Fiberoptic Bronchoscopy.  A Time Out was held and the above information confirmed.   Prior to the date of the procedure a high-resolution CT scan of the chest was performed. Utilizing ION software program a virtual tracheobronchial tree was generated to allow the creation of distinct navigation pathways to the patient's parenchymal abnormalities. After being taken to the operating room general anesthesia was initiated and the patient  was orally intubated. The video fiberoptic bronchoscope was introduced via the endotracheal tube and a general inspection was performed which showed normal right and left lung anatomy, aspiration of the bilateral mainstems was completed to remove any remaining  secretions. Robotic catheter inserted into patient's endotracheal tube.   Target #1 right upper lobe, sub solid lesion: The distinct navigation pathways prepared prior to this procedure were then utilized to navigate to patient's lesion identified on CT scan. The robotic catheter was secured into place and the vision probe was withdrawn.  Lesion location was approximated using fluoroscopy and radial endobronchial ultrasound and three-dimensional cone beam CT imaging for peripheral targeting. Under fluoroscopic guidance transbronchial needle brushings, and transbronchial forceps biopsies were performed to be sent for cytology and pathology.  Following tissue sampling a single fiducial was placed under fluoroscopic guidance using a fiducial catheter wire and delivery kit.  A bronchioalveolar lavage was performed in the right upper lobe and sent for cultures, microbiology.  At the end of the procedure a general airway inspection was performed and there was no evidence of active bleeding. The bronchoscope was removed.  The patient tolerated the procedure well. There was no significant blood loss and there were no obvious complications. A post-procedural chest x-ray is pending.  Samples Target #1: 1. Transbronchial needle brushings from right upper lobe 2. Transbronchial forceps biopsies from right upper lobe 3. Bronchoalveolar lavage from right upper lobe  Plans:  The patient will be discharged from the PACU to home when recovered from anesthesia and after chest x-ray is reviewed. We will review the cytology, pathology and microbiology results with the patient when they become available. Outpatient followup will be with Garner Nash, DO.  Garner Nash, DO [facility] 04/21/2021 9:53 AM

## 2021-04-21 NOTE — Transfer of Care (Signed)
Immediate Anesthesia Transfer of Care Note  Patient: Tonya Thompson  Procedure(s) Performed: ROBOTIC ASSISTED NAVIGATIONAL BRONCHOSCOPY (Right) BRONCHIAL BRUSHINGS BRONCHIAL BIOPSIES RADIAL ENDOBRONCHIAL ULTRASOUND FIDUCIAL MARKER PLACEMENT BRONCHIAL WASHINGS  Patient Location: PACU  Anesthesia Type:General  Level of Consciousness: drowsy  Airway & Oxygen Therapy: Patient Spontanous Breathing  Post-op Assessment: Report given to RN and Post -op Vital signs reviewed and stable  Post vital signs: Reviewed and stable  Last Vitals:  Vitals Value Taken Time  BP 125/74 04/21/21 1008  Temp    Pulse 84 04/21/21 1009  Resp 19 04/21/21 1009  SpO2 97 % 04/21/21 1009  Vitals shown include unvalidated device data.  Last Pain:  Vitals:   04/21/21 0734  TempSrc:   PainSc: 0-No pain         Complications: No notable events documented.

## 2021-04-22 LAB — CYTOLOGY - NON PAP

## 2021-04-23 ENCOUNTER — Encounter (HOSPITAL_COMMUNITY): Payer: Self-pay | Admitting: Pulmonary Disease

## 2021-04-23 LAB — ACID FAST SMEAR (AFB, MYCOBACTERIA): Acid Fast Smear: NEGATIVE

## 2021-04-23 LAB — CULTURE, BAL-QUANTITATIVE W GRAM STAIN: Culture: NO GROWTH

## 2021-04-26 LAB — AEROBIC/ANAEROBIC CULTURE W GRAM STAIN (SURGICAL/DEEP WOUND): Culture: NO GROWTH

## 2021-04-29 NOTE — Progress Notes (Addendum)
@Patient  ID: Tonya Thompson, female    DOB: Mar 07, 1956, 65 y.o.   MRN: [id number]  Chief Complaint  Patient presents with   Follow-up    Nodule     Referring provider: Unk Pinto, MD  HPI: 65 year old female, never smoked.  Past medical history significant for hypertension, epilepsy, right hemiparesis, chronic kidney disease stage III, osteopenia, pulmonary nodules.  Patient of Dr. Valeta Harms, seen for initial consult on 03/19/2021 for right upper lobe pulmonary nodule.  Previous [facility] pulmonary counter 03/19/21- Dr. Valeta Harms, consult This is a 65 year old female, past medical history of multiple CVAs, history of COVID-19, gastroesophageal reflux, seizure disorder she has had a total of at least 5 or 6 strokes most of them were when she was younger.  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  She lives in assisted living at this point.  She originally had a CT scan in April 2022 which revealed a right upper lobe subsolid lesion.  She had subsequent CT scan follow-up which revealed persistence of this lesion last month.  She was referred from her primary care provider for evaluation of this right upper lobe nodule to discuss next steps.  She has secondhand smoke exposure from her mother when she lived with her several years ago.  Overall lifelong non-smoker.  Discussion:   This is a 65 year old female, past medical history of multiple strokes, she does have some debility because of this, does have a rolling walker mobility issues and lives in an assisted living.  She does have a seizure disorder as well.  She has a persistent right upper lobe subsolid lesion within the chest concerning for primary malignancy.  We discussed potential watchful waiting to see if there is any evolution of the nodule versus going ahead to proceed with biopsy.  Plan: Today in the office we discussed the risk benefits and alternatives of proceeding with robotic assisted navigation. Patient is agreeable to proceed with tissue  sampling of the nodule. She is pretty nervous about this would like to go ahead and have a biopsy to decide whether or not this is a malignancy. I think it is reasonable to move forward with tissue sampling.  She would like to have this done in a couple of weeks right before Christmas if possible.  We will tentatively set bronchoscopy date for 04/21/2021. I think since is going to be at least another month before we get bronchoscopy done we will have a repeat super D noncontrasted CT chest complete right prior to the procedure.  04/30/2021- Interim hx  Patient presents today for follow-up. Accompanied by family member. She lives at [facility]. She is feeling ok today, no acute complaints. She has a very slight dry cough but nothing significant. She underwent bronchoscopy with Dr. Valeta Harms on 04/21/21. Culture showed no growth. AFB and fungal culture were negative. RUL brushing and biopsy were negative for malignant cells. She moved from [facility] and would like referral to [facility] cardiologist. She described some intermittent esophageal discomfort which is relieved by TUMs. She has stable mild tortuosity, ectasia and calcification of them thoracic aorta. Stable scattered coronary artery calcifications. Denies purulent cough, shortness of breath, chest tightness or wheezing.   04/21/21 Cytology A. LUNG, RUL, BRUSHING:  - No malignant cells identified   B. LUNG, RUL, BIOPSY:  - No malignant cells identified   Allergies  Allergen Reactions   Aspirin Other (See Comments)    CONTRAINDICATED DUE TO HISTORY OF BLEEDING IN BRAIN   Cheese     Cant take  due to history of severe migraines   Chocolate     Cant take due to history of severe migraines   Coffee Flavor     Cant take due to history of severe migraines   Other      Nuts - Cant take due to history of severe migraines   Red Wine Complex [Germanium]     Cant take due to history of severe migraines    Immunization History   Administered Date(s) Administered   Influenza Inj Mdck Quad Pf 02/16/2017   Influenza Inj Mdck Quad With Preservative 02/16/2018, 03/01/2019   Influenza,inj,Quad PF,6+ Mos 03/01/2017   Influenza-Unspecified 03/01/2017, 12/17/2019, 02/24/2021   PFIZER(Purple Top)SARS-COV-2 Vaccination 07/22/2019, 08/12/2019, 03/10/2020, 11/10/2020   Pneumococcal-Unspecified 05/03/2004   Td 10/19/2019   Tdap 08/11/2009   Zoster, Live 04/14/2012    Past Medical History:  Diagnosis Date   Acute kidney injury ([place]) 09/03/2019   05-2019 kidney stone removed   Allergy    seasonal allergies   Arthritis    LEFT hand   AVM (arteriovenous malformation) brain    Cataract    bilateral-not a surgical candidate at this time (07/23/2020)   COVID-19 virus infection 08/2020   GERD (gastroesophageal reflux disease)    on meds   Hyperlipidemia    diet controlled   Insulin resistance 02/16/2018   Seizures ([place])    [place] & Partial complex seizure disorder-last 1998   Stroke ([place])    5 total so far (age [age],14,20,24)   Ureteral stone 05/10/2019   has multiple kidney stones noted from Urologist   Uses roller walker     Tobacco History: Social History   Tobacco Use  Smoking Status Never   Passive exposure: Past  Smokeless Tobacco Never   Counseling given: Not Answered   Outpatient Medications Prior to Visit  Medication Sig Dispense Refill   acetaminophen (TYLENOL) 325 MG tablet Take 650 mg by mouth every 4 (four) hours as needed for moderate pain.     baclofen (LIORESAL) 10 MG tablet Take 1 tablet up to four times daily as needed 120 each 11   Calcium Carbonate 500 MG CHEW Chew 500 mg by mouth daily.     divalproex (DEPAKOTE ER) 500 MG 24 hr tablet Take 2 tablets every night 60 tablet 11   fexofenadine (ALLEGRA) 180 MG tablet Take 180 mg by mouth daily.     guaiFENesin (MUCINEX) 600 MG 12 hr tablet Take 600 mg by mouth 2 (two) times daily as needed for cough.     Multiple Vitamins-Minerals  (THEREMS-M) TABS Take 1 tablet by mouth daily.     Omega-3 Fatty Acids (FISH OIL PO) Take 1 capsule by mouth daily.     Propylene Glycol (SYSTANE BALANCE) 0.6 % SOLN Place 1 drop into both eyes 2 (two) times daily.     topiramate (TOPAMAX) 200 MG tablet Take 300 mg by mouth 2 (two) times daily.     Vitamin D3 (VITAMIN D) 25 MCG tablet Take 1,000 Units by mouth daily.     zinc oxide 20 % ointment Apply 1 application topically as needed for irritation.     No facility-administered medications prior to visit.   Review of Systems  Review of Systems  Constitutional: Negative.   HENT: Negative.    Respiratory:  Positive for cough. Negative for chest tightness, shortness of breath and wheezing.   Cardiovascular: Negative.     Physical Exam  BP 90/60 (BP Location: Left Arm, Cuff Size: Normal)  Pulse 94    Temp 97.8 F (36.6 C) (Oral)    Ht 5\' 5"  (1.651 m)    Wt 145 lb 9.6 oz (66 kg)    SpO2 99%    BMI 24.23 kg/m  Physical Exam Constitutional:      Appearance: Normal appearance.  HENT:     Head: Normocephalic and atraumatic.  Cardiovascular:     Rate and Rhythm: Normal rate and regular rhythm.  Pulmonary:     Effort: Pulmonary effort is normal.     Breath sounds: Normal breath sounds. No wheezing, rhonchi or rales.  Skin:    General: Skin is warm and dry.  Neurological:     General: No focal deficit present.     Mental Status: She is alert and oriented to person, place, and time. Mental status is at baseline.  Psychiatric:        Mood and Affect: Mood normal.        Behavior: Behavior normal.        Thought Content: Thought content normal.        Judgment: Judgment normal.     Lab Results:  CBC    Component Value Date/Time   WBC 5.4 04/03/2021 1003   RBC 4.26 04/03/2021 1003   HGB 14.7 04/03/2021 1003   HCT 44.0 04/03/2021 1003   PLT 158 04/03/2021 1003   MCV 103.3 (H) 04/03/2021 1003   MCH 34.5 (H) 04/03/2021 1003   MCHC 33.4 04/03/2021 1003   RDW 13.5 04/03/2021  1003   LYMPHSABS 1,566 04/03/2021 1003   MONOABS 0.8 07/28/2020 1258   EOSABS 130 04/03/2021 1003   BASOSABS 32 04/03/2021 1003    BMET    Component Value Date/Time   NA 143 04/03/2021 1003   K 3.6 04/03/2021 1003   CL 107 04/03/2021 1003   CO2 26 04/03/2021 1003   GLUCOSE 55 (L) 04/03/2021 1003   BUN 33 (H) 04/03/2021 1003   CREATININE 1.42 (H) 04/03/2021 1003   CALCIUM 9.3 04/03/2021 1003   GFRNONAA 51 (L) 07/28/2020 1258   GFRNONAA 41 (L) 06/26/2020 1001   GFRAA 47 (L) 06/26/2020 1001    BNP No results found for: BNP  ProBNP No results found for: PROBNP  Imaging: MR ANGIO HEAD WO CONTRAST  Result Date: 04/13/2021 CLINICAL DATA:  Localization-related (focal) (partial) symptomatic epilepsy and epileptic syndromes with complex partial seizures, not intractable, without status epilepticus ([place]) G40.209 (ICD-10-CM). Seizure, stroke. Right hemiparesis ([place]) G81.91 (ICD-10-CM). Migraine without aura and without status migrainosus, not intractable G43.009 (ICD-10-CM). History of arteriovenous malformation (AVM) Z87.74 (ICD-10-CM). EXAM: MRA HEAD WITHOUT CONTRAST TECHNIQUE: Angiographic images of the Circle of Willis were acquired using MRA technique without intravenous contrast. COMPARISON:  CT angiogram July 24, 2020. FINDINGS: The study is partially degraded by motion. Anterior circulation: Increased tortuosity of the visualized upper cervical segment of the left ACA. The bilateral internal carotid arteries. The bilateral MCA and ACA vascular tree appear preserved. Focal loss of flow related signal at the A1-A2 and distal M2-M3 branches is related to motion artifact. Posterior circulation: The vertebral arteries are widely patent with antegrade flow. Vertebrobasilar junction and basilar artery are widely patent with antegrade flow without evidence of basilar stenosis or aneurysm. Posterior cerebral arteries are normal bilaterally. No intracranial aneurysm within the posterior  circulation. Anatomic variants: None significant. Other: None. IMPRESSION: Motion degraded study, otherwise unremarkable MR angiogram. While no AVM nidus is identified in the current study, this may be related to prominent susceptibility artifact from  residual blood products, as seen on concurrent MRI of the brain. Follow-up with CT angiogram is suggested. Electronically Signed   By: Pedro Earls M.D.   On: 04/13/2021 16:10   MR BRAIN WO CONTRAST  Result Date: 04/13/2021 CLINICAL DATA:  seizure, stroke. MR BRAIN WO; MRA HEAD WO; hx of seizures since childhood; no seizure since 1998; brain sx for AVM in 1980; hx of multiple strokes with right sided hemiparesis EXAM: MRI HEAD WITHOUT CONTRAST TECHNIQUE: Multiplanar, multiecho pulse sequences of the brain and surrounding structures were obtained without intravenous contrast. COMPARISON:  08/20/2020 FINDINGS: Brain: No acute infarct, mass effect or extra-axial collection. Unchanged large area encephalomalacia in the left hemisphere with ex vacuo dilatation of the left lateral ventricle. Unchanged areas of periventricular gliosis bilaterally, but much greater on the left than the right. Mild vermian atrophy. Vascular: Major flow voids are preserved. Skull and upper cervical spine: Remote left-sided craniotomy. Sinuses/Orbits:No paranasal sinus fluid levels or advanced mucosal thickening. No mastoid or middle ear effusion. Normal orbits. IMPRESSION: 1. No acute intracranial abnormality. 2. Unchanged large area of encephalomalacia in the left hemisphere with ex vacuo dilatation of the left lateral ventricle. Electronically Signed   By: Ulyses Jarred M.D.   On: 04/13/2021 15:19   DG CHEST PORT 1 VIEW  Result Date: 04/21/2021 CLINICAL DATA:  Post bronchoscopy with biopsy EXAM: PORTABLE CHEST 1 VIEW COMPARISON:  CT 04/15/2021 FINDINGS: Vague airspace opacity in the right upper lobe, likely related to bronchoscopy. No pneumothorax. Heart is normal  size. Left lung clear. No effusions or acute bony abnormality. IMPRESSION: No pneumothorax following bronchoscopy. Electronically Signed   By: Rolm Baptise M.D.   On: 04/21/2021 10:17   CT Super D Chest Wo Contrast  Result Date: 04/16/2021 CLINICAL DATA:  Follow-up right upper lobe lung nodule. Preop for EMB biopsy. EXAM: CT CHEST WITHOUT CONTRAST TECHNIQUE: Multidetector CT imaging of the chest was performed using thin slice collimation for electromagnetic bronchoscopy planning purposes, without intravenous contrast. COMPARISON:  Chest CT 02/23/2021 FINDINGS: Cardiovascular: The heart is within normal limits in size and stable. No pericardial effusion. Stable mild tortuosity, ectasia and calcification of the thoracic aorta. Stable scattered coronary artery calcifications. Mediastinum/Nodes: No mediastinal or hilar mass or adenopathy. The esophagus is grossly normal. Lungs/Pleura: Part solid right upper lobe lung lesion is grossly stable. Mixed ground-glass and cystic type changes surround a more central solid portion. The entire lesion measures approximately 1.5 x 1.2 cm. The more solid central portion measures 7 mm. A few small scattered sub 4 mm nodules are stable. No new pulmonary lesions or acute pulmonary findings. No pleural effusion. Upper Abdomen: No significant upper abdominal findings. Bilateral renal calculi are noted. Stable aortic calcifications. No hepatic or adrenal gland lesions. Musculoskeletal: No breast masses, supraclavicular or axillary adenopathy. The bony thorax is intact. IMPRESSION: 1. Stable part solid right upper lobe lung lesion. 2. No mediastinal or hilar mass or adenopathy. 3. Stable scattered sub 4 mm pulmonary nodules. 4. Stable atherosclerotic calcifications involving the aorta and coronary arteries. 5. Bilateral renal calculi. Aortic Atherosclerosis (ICD10-I70.0) and Emphysema (ICD10-J43.9). Electronically Signed   By: Marijo Sanes M.D.   On: 04/16/2021 15:55   DG C-ARM  BRONCHOSCOPY  Result Date: 04/21/2021 C-ARM BRONCHOSCOPY: Fluoroscopy was utilized by the requesting physician.  No radiographic interpretation.     Assessment & Plan:   Pulmonary nodules - Patient underwent bronchoscopy on 04/21/21 with Dr. Valeta Harms. RUL brushing and biopsy were negative for malignant cells. AFB, fungal and  Aerobic/anaerobic cultures showed no growth. Recommend following up in 6 months with repeat CT chest wo contrast.   Coronary artery calcification - She has stable mild tortuosity, ectasia and calcification of them thoracic aorta. Stable scattered coronary artery calcifications on imaging - Patient moved from [facility] and would like to establish with cardiologist in [place], NP 04/30/2021

## 2021-04-30 ENCOUNTER — Other Ambulatory Visit: Payer: Self-pay

## 2021-04-30 ENCOUNTER — Ambulatory Visit (INDEPENDENT_AMBULATORY_CARE_PROVIDER_SITE_OTHER): Payer: Medicare Other | Admitting: Primary Care

## 2021-04-30 ENCOUNTER — Encounter: Payer: Self-pay | Admitting: Primary Care

## 2021-04-30 VITALS — BP 90/60 | HR 94 | Temp 97.8°F | Ht 65.0 in | Wt 145.6 lb

## 2021-04-30 DIAGNOSIS — I2584 Coronary atherosclerosis due to calcified coronary lesion: Secondary | ICD-10-CM | POA: Diagnosis not present

## 2021-04-30 DIAGNOSIS — R918 Other nonspecific abnormal finding of lung field: Secondary | ICD-10-CM | POA: Diagnosis not present

## 2021-04-30 DIAGNOSIS — I2510 Atherosclerotic heart disease of native coronary artery without angina pectoris: Secondary | ICD-10-CM | POA: Insufficient documentation

## 2021-04-30 DIAGNOSIS — I7090 Unspecified atherosclerosis: Secondary | ICD-10-CM | POA: Diagnosis not present

## 2021-04-30 NOTE — Assessment & Plan Note (Signed)
-   She has stable mild tortuosity, ectasia and calcification of them thoracic aorta. Stable scattered coronary artery calcifications on imaging - Patient moved from [facility] and would like to establish with cardiologist in [place]

## 2021-04-30 NOTE — Assessment & Plan Note (Addendum)
-   Patient underwent bronchoscopy on 04/21/21 with Dr. Valeta Harms. RUL brushing and biopsy were negative for malignant cells. AFB, fungal and Aerobic/anaerobic cultures showed no growth. Recommend following up in 6 months with repeat CT chest wo contrast.

## 2021-04-30 NOTE — Patient Instructions (Addendum)
Bronchoscopy biopsy was negative for malignant cells. AFB, fungal and Aerobic/anaerobic cultures showed no growth   We will likely continue to monitor RUL nodule with serial CT scans, I will discuss with Dr. Valeta Harms  Referral: Cardiology (ordered)  Follow-up: 6 months with Dr. Valeta Harms or sooner if needed

## 2021-04-30 NOTE — Addendum Note (Signed)
Addended by: Martyn Ehrich on: 04/30/2021 12:05 PM   Modules accepted: Orders

## 2021-05-06 ENCOUNTER — Encounter: Payer: Self-pay | Admitting: Orthopedic Surgery

## 2021-05-06 ENCOUNTER — Non-Acute Institutional Stay: Payer: Medicare Other | Admitting: Orthopedic Surgery

## 2021-05-06 DIAGNOSIS — E782 Mixed hyperlipidemia: Secondary | ICD-10-CM | POA: Diagnosis not present

## 2021-05-06 DIAGNOSIS — G40909 Epilepsy, unspecified, not intractable, without status epilepticus: Secondary | ICD-10-CM

## 2021-05-06 DIAGNOSIS — R4189 Other symptoms and signs involving cognitive functions and awareness: Secondary | ICD-10-CM

## 2021-05-06 DIAGNOSIS — R0989 Other specified symptoms and signs involving the circulatory and respiratory systems: Secondary | ICD-10-CM | POA: Diagnosis not present

## 2021-05-06 DIAGNOSIS — R911 Solitary pulmonary nodule: Secondary | ICD-10-CM

## 2021-05-06 DIAGNOSIS — N1831 Chronic kidney disease, stage 3a: Secondary | ICD-10-CM | POA: Diagnosis not present

## 2021-05-06 DIAGNOSIS — B372 Candidiasis of skin and nail: Secondary | ICD-10-CM

## 2021-05-06 DIAGNOSIS — G8191 Hemiplegia, unspecified affecting right dominant side: Secondary | ICD-10-CM

## 2021-05-06 DIAGNOSIS — R519 Headache, unspecified: Secondary | ICD-10-CM | POA: Diagnosis not present

## 2021-05-06 MED ORDER — NYSTATIN 100000 UNIT/GM EX POWD: 1.0000 "application " | Freq: Two times a day (BID) | CUTANEOUS | 0 refills | Status: AC

## 2021-05-06 NOTE — Progress Notes (Signed)
Location:   [facility] Room Number: [place] of Service:  ALF ([id number]) Provider:  Yvonna Alanis, NP  Unk Pinto, MD  Patient Care Team: Unk Pinto, MD as PCP - General (Internal Medicine) Cameron Sprang, MD as Consulting Physician (Neurology) Corliss Parish, MD as Consulting Physician (Nephrology) Raynelle Bring, MD as Consulting Physician (Urology)  Extended Emergency Contact Information Primary Emergency Contact: Barnie Del [place] of [place] Phone: [phone] Mobile Phone: [phone] Relation: Relative Secondary Emergency Contact: [place] Phone: [phone] Mobile Phone: [phone] Relation: Sister  Code Status:  DNR Goals of care: Advanced Directive information Advanced Directives 05/06/2021  Does Patient Have a Medical Advance Directive? Yes  Type of Paramedic of [place];Living will  Does patient want to make changes to medical advance directive? No - Patient declined  Copy of [place] in Chart? No - copy requested     Chief Complaint  Patient presents with   Acute Visit    redness under breast    HPI:  Pt is a 66 y.o. female seen today for an acute visit for redness under breast.   She currently resides on the assisted living unit at [facility]. PMH: HTN, epilepsy, right hemiparesis, osteopenia, CKD, HLD, pulmonary nodule, and vitamin D deficiency.   She reports rash under right breast for the past 2 days. She has been applying zinc oxide on rash without improvement. Denies itching, describes area as "irritation." She does not wear undergarments often. She has been wearing warmer clothing due to cold weather.   Seizures- No recent episodes, last seizure 1998, abnormal EEGs in past with left hemisphere slowing/ maximal left temporal central area with epileptiform activity, remains on Topamax and Depakote Right sided hemiparesis- hx of hemorrhagic  stroke at age 63 with subsequent strokes after- last stroke 1984, admitted to [place] 3 months ago, no recent falls, ambulates with walker and foot brace, working with PT/OT, remains on on Baclofen prn  HTN- diet controlled, admits to low sodium diet, not on medication CKD- BUN/creat 33/1.42 04/03/2021 HLD- LDL 10112/06/2020, not on medication at this time Pulmonary nodule- history of second hand smoke as child, seen by pulmonary 11/17/20222 for right upper lobe nodule, 04/21/2021 bronchoscopy done by Dr. Valeta Harms- culture showed no growth, AFB and fungal culture negative, negative for malignancy Migraines- migraine free since 2017, past use of Fioricet/ prednisone taper/ IV decadron, remains on tylenol at this time  Impaired cognition- word finding more, MRI brain 04/13/2021 with unchanged large area of encephalomalacia in left hemisphere     Past Medical History:  Diagnosis Date   Acute kidney injury ([place]) 09/03/2019   05-2019 kidney stone removed   Allergy    seasonal allergies   Arthritis    LEFT hand   AVM (arteriovenous malformation) brain    Cataract    bilateral-not a surgical candidate at this time (07/23/2020)   COVID-19 virus infection 08/2020   GERD (gastroesophageal reflux disease)    on meds   Hyperlipidemia    diet controlled   Insulin resistance 02/16/2018   Seizures ([place])    [place] Partial complex seizure disorder-last 1998   Stroke [facility])    5 total so far (age [phone])   Ureteral stone 05/10/2019   has multiple kidney stones noted from Urologist   Uses roller walker    Past Surgical History:  Procedure Laterality Date   BRAIN SURGERY  09/19/1978   at [facility], Dr. Leida Lauth  BRONCHIAL BIOPSY  04/21/2021   Procedure: BRONCHIAL BIOPSIES;  Surgeon: Garner Nash, DO;  Location: [place] [facility];  Service: Pulmonary;;   BRONCHIAL BRUSHINGS  04/21/2021   Procedure: BRONCHIAL BRUSHINGS;  Surgeon: Garner Nash, DO;  Location: [place] [facility];  Service:  Pulmonary;;   BRONCHIAL WASHINGS  04/21/2021   Procedure: BRONCHIAL WASHINGS;  Surgeon: Garner Nash, DO;  Location: [facility];  Service: Pulmonary;;   COLONOSCOPY  2017   [place], [place]   CYSTOSCOPY/URETEROSCOPY/HOLMIUM LASER/STENT PLACEMENT Left 05/10/2019   Procedure: CYSTOSCOPY/RETROGRADE/URETEROSCOPY/HOLMIUM LASER/STENT PLACEMENT;  Surgeon: Raynelle Bring, MD;  Location: [facility];  Service: Urology;  Laterality: Left;   FIDUCIAL MARKER PLACEMENT  04/21/2021   Procedure: FIDUCIAL MARKER PLACEMENT;  Surgeon: Garner Nash, DO;  Location: [place] [facility];  Service: Pulmonary;;   FOOT SURGERY Right 10/1985   ankle fusion, at [facility], Dr. Lorelee Cover   POLYPECTOMY  2017   multiple adenomas   [place] WITH RADIAL ENDOBRONCHIAL ULTRASOUND  04/21/2021   Procedure: RADIAL ENDOBRONCHIAL ULTRASOUND;  Surgeon: Garner Nash, DO;  Location: [facility];  Service: Pulmonary;;   WISDOM TOOTH EXTRACTION      Allergies  Allergen Reactions   Aspirin Other (See Comments)    CONTRAINDICATED DUE TO HISTORY OF BLEEDING IN BRAIN   Cheese     Cant take due to history of severe migraines   Chocolate     Cant take due to history of severe migraines   Coffee Flavor     Cant take due to history of severe migraines   Other      Nuts - Cant take due to history of severe migraines   Red Wine Complex [Germanium]     Cant take due to history of severe migraines    Allergies as of 05/06/2021       Reactions   Aspirin Other (See Comments)   CONTRAINDICATED DUE TO HISTORY OF BLEEDING IN BRAIN   Cheese    Cant take due to history of severe migraines   Chocolate    Cant take due to history of severe migraines   Coffee Flavor    Cant take due to history of severe migraines   Other     Nuts - Cant take due to history of severe migraines   Red Wine Complex [germanium]    Cant take due to history of severe migraines        Medication List        Accurate as of May 06, 2021 10:45 AM. If you have any questions, ask your nurse or doctor.          acetaminophen 325 MG tablet Commonly known as: TYLENOL Take 650 mg by mouth every 4 (four) hours as needed for moderate pain.   baclofen 10 MG tablet Commonly known as: LIORESAL Take 1 tablet up to four times daily as needed   Calcium Carbonate 500 MG Chew Chew 500 mg by mouth daily.   Calcium Citrate + D 315-5 MG-MCG Tabs Generic drug: Calcium Citrate-Vitamin D Take 1 tablet by mouth daily at 6 (six) AM.   divalproex 500 MG 24 hr tablet Commonly known as: DEPAKOTE ER Take 2 tablets every night   fexofenadine 180 MG tablet Commonly known as: ALLEGRA Take 180 mg by mouth daily.   FISH OIL PO Take 1 capsule by mouth daily.   guaiFENesin 600 MG 12 hr tablet Commonly known as: MUCINEX Take 600 mg by mouth 2 (two) times  daily as needed for cough.   Systane Balance 0.6 % Soln Generic drug: Propylene Glycol Place 1 drop into both eyes 2 (two) times daily.   Therems-M Tabs Take 1 tablet by mouth daily.   topiramate 200 MG tablet Commonly known as: TOPAMAX Take 300 mg by mouth 2 (two) times daily.   Vitamin D3 25 MCG tablet Commonly known as: Vitamin D Take 1,000 Units by mouth daily.   zinc oxide 20 % ointment Apply 1 application topically as needed for irritation.        Review of Systems  Constitutional:  Negative for activity change, appetite change, chills and fever.  HENT:  Negative for congestion and trouble swallowing.   Eyes:  Negative for visual disturbance.  Respiratory:  Negative for cough, shortness of breath and wheezing.   Cardiovascular:  Negative for chest pain and leg swelling.  Gastrointestinal:  Negative for abdominal distention, abdominal pain, constipation, diarrhea and nausea.  Genitourinary:  Negative for dysuria, frequency and hematuria.  Musculoskeletal:  Positive for gait problem.  Skin:  Positive for rash.  Neurological:  Positive for weakness and  headaches. Negative for dizziness.  Psychiatric/Behavioral:  Positive for confusion. Negative for dysphoric mood and sleep disturbance. The patient is not nervous/anxious.    Immunization History  Administered Date(s) Administered   Influenza Inj Mdck Quad Pf 02/16/2017   Influenza Inj Mdck Quad With Preservative 02/16/2018, 03/01/2019   Influenza,inj,Quad PF,6+ Mos 03/01/2017   Influenza-Unspecified 03/01/2017, 12/17/2019, 02/24/2021   PFIZER(Purple Top)SARS-COV-2 Vaccination 07/22/2019, 08/12/2019, 03/10/2020, 11/10/2020   Pneumococcal-Unspecified 05/03/2004   Td 10/19/2019   Tdap 08/11/2009   Zoster, Live 04/14/2012   Pertinent  Health Maintenance Due  Topic Date Due   PAP SMEAR-Modifier  12/06/2020   MAMMOGRAM  08/28/2022   COLONOSCOPY (Pts 45-31yrs Insurance coverage will need to be confirmed)  01/09/2024   INFLUENZA VACCINE  Completed   Fall Risk 08/22/2020 09/01/2020 01/27/2021 03/04/2021 04/21/2021  Falls in the past year? - 1 1 - -  Was there an injury with Fall? - 0 0 0 -  Fall Risk Category Calculator - 2 2 - -  Fall Risk Category - Moderate Moderate - -  Patient Fall Risk Level Moderate fall risk High fall risk - Moderate fall risk High fall risk  Patient at Risk for Falls Due to - - History of fall(s);Impaired balance/gait;Impaired mobility - -  Fall risk Follow up - - Education provided;Falls prevention discussed;Falls evaluation completed - -   Functional Status Survey:    Vitals:   05/06/21 1030  BP: (!) 111/91  Pulse: 87  Resp: 18  Temp: 97.7 F (36.5 C)  SpO2: 97%  Weight: 147 lb 12.8 oz (67 kg)  Height: 5\' 5"  (1.651 m)   Body mass index is 24.6 kg/m. Physical Exam Vitals reviewed.  Constitutional:      General: She is not in acute distress. Eyes:     General:        Right eye: No discharge.        Left eye: No discharge.  Neck:     Vascular: No carotid bruit.  Cardiovascular:     Rate and Rhythm: Normal rate and regular rhythm.     Pulses:  Normal pulses.     Heart sounds: Normal heart sounds. No murmur heard. Pulmonary:     Effort: Pulmonary effort is normal. No respiratory distress.     Breath sounds: Normal breath sounds. No wheezing.  Abdominal:     General: Bowel sounds are  normal. There is no distension.     Palpations: Abdomen is soft.     Tenderness: There is no abdominal tenderness.  Musculoskeletal:     Cervical back: Normal range of motion.     Right lower leg: No edema.     Left lower leg: No edema.  Lymphadenopathy:     Cervical: No cervical adenopathy.  Skin:    General: Skin is warm and dry.     Capillary Refill: Capillary refill takes less than 2 seconds.     Comments: Skin under right breast excoriated, appears moist, no skin breakdown.   Neurological:     General: No focal deficit present.     Mental Status: She is alert and oriented to person, place, and time.     Motor: Weakness present.     Coordination: Coordination abnormal.     Gait: Gait abnormal.     Comments: Right sided weakness  Psychiatric:        Mood and Affect: Mood normal.        Behavior: Behavior normal.     Comments: Word finding    Labs reviewed: Recent Labs    06/26/20 1001 07/24/20 0929 12/19/20 1002 01/27/21 1731 04/03/21 1003  NA 142   < > 141 142 143  K 3.9   < > 3.8 5.4* 3.6  CL 108   < > 109 108 107  CO2 28   < > 26 27 26   GLUCOSE 79   < > 79 93 55*  BUN 31*   < > 26* 27* 33*  CREATININE 1.38*   < > 1.12* 1.23* 1.42*  CALCIUM 10.2   < > 8.8 9.0 9.3  MG 2.1  --  2.0  --  2.2   < > = values in this interval not displayed.   Recent Labs    07/28/20 1258 12/19/20 1002 01/27/21 1731 04/03/21 1003  AST 20 19 26  49*  ALT 28 22 22  31*  ALKPHOS 115  --   --   --   BILITOT 0.8 0.4 0.4 0.4  PROT 7.5 6.3 6.3 6.3  ALBUMIN 3.9  --   --   --    Recent Labs    12/19/20 1002 01/27/21 1731 04/03/21 1003  WBC 4.7 6.2 5.4  NEUTROABS 2,585 3,317 3,029  HGB 13.1 14.0 14.7  HCT 40.4 42.3 44.0  MCV 101.0*  100.7* 103.3*  PLT 204 176 158   Lab Results  Component Value Date   TSH 4.77 (H) 04/03/2021   Lab Results  Component Value Date   HGBA1C 4.8 12/19/2020   Lab Results  Component Value Date   CHOL 184 04/03/2021   HDL 60 04/03/2021   LDLCALC 101 (H) 04/03/2021   TRIG 126 04/03/2021   CHOLHDL 3.1 04/03/2021    Significant Diagnostic Results in last 30 days:  MR ANGIO HEAD WO CONTRAST  Result Date: 04/13/2021 CLINICAL DATA:  Localization-related (focal) (partial) symptomatic epilepsy and epileptic syndromes with complex partial seizures, not intractable, without status epilepticus ([place]) G40.209 (ICD-10-CM). Seizure, stroke. Right hemiparesis ([place]) G81.91 (ICD-10-CM). Migraine without aura and without status migrainosus, not intractable G43.009 (ICD-10-CM). History of arteriovenous malformation (AVM) Z87.74 (ICD-10-CM). EXAM: MRA HEAD WITHOUT CONTRAST TECHNIQUE: Angiographic images of the Circle of Willis were acquired using MRA technique without intravenous contrast. COMPARISON:  CT angiogram July 24, 2020. FINDINGS: The study is partially degraded by motion. Anterior circulation: Increased tortuosity of the visualized upper cervical segment of the left ACA. The bilateral  internal carotid arteries. The bilateral MCA and ACA vascular tree appear preserved. Focal loss of flow related signal at the A1-A2 and distal M2-M3 branches is related to motion artifact. Posterior circulation: The vertebral arteries are widely patent with antegrade flow. Vertebrobasilar junction and basilar artery are widely patent with antegrade flow without evidence of basilar stenosis or aneurysm. Posterior cerebral arteries are normal bilaterally. No intracranial aneurysm within the posterior circulation. Anatomic variants: None significant. Other: None. IMPRESSION: Motion degraded study, otherwise unremarkable MR angiogram. While no AVM nidus is identified in the current study, this may be related to prominent  susceptibility artifact from residual blood products, as seen on concurrent MRI of the brain. Follow-up with CT angiogram is suggested. Electronically Signed   By: Pedro Earls M.D.   On: 04/13/2021 16:10   MR BRAIN WO CONTRAST  Result Date: 04/13/2021 CLINICAL DATA:  seizure, stroke. MR BRAIN WO; MRA HEAD WO; hx of seizures since childhood; no seizure since 1998; brain sx for AVM in 1980; hx of multiple strokes with right sided hemiparesis EXAM: MRI HEAD WITHOUT CONTRAST TECHNIQUE: Multiplanar, multiecho pulse sequences of the brain and surrounding structures were obtained without intravenous contrast. COMPARISON:  08/20/2020 FINDINGS: Brain: No acute infarct, mass effect or extra-axial collection. Unchanged large area encephalomalacia in the left hemisphere with ex vacuo dilatation of the left lateral ventricle. Unchanged areas of periventricular gliosis bilaterally, but much greater on the left than the right. Mild vermian atrophy. Vascular: Major flow voids are preserved. Skull and upper cervical spine: Remote left-sided craniotomy. Sinuses/Orbits:No paranasal sinus fluid levels or advanced mucosal thickening. No mastoid or middle ear effusion. Normal orbits. IMPRESSION: 1. No acute intracranial abnormality. 2. Unchanged large area of encephalomalacia in the left hemisphere with ex vacuo dilatation of the left lateral ventricle. Electronically Signed   By: Ulyses Jarred M.D.   On: 04/13/2021 15:19   DG CHEST PORT 1 VIEW  Result Date: 04/21/2021 CLINICAL DATA:  Post bronchoscopy with biopsy EXAM: PORTABLE CHEST 1 VIEW COMPARISON:  CT 04/15/2021 FINDINGS: Vague airspace opacity in the right upper lobe, likely related to bronchoscopy. No pneumothorax. Heart is normal size. Left lung clear. No effusions or acute bony abnormality. IMPRESSION: No pneumothorax following bronchoscopy. Electronically Signed   By: Rolm Baptise M.D.   On: 04/21/2021 10:17   CT Super D Chest Wo  Contrast  Result Date: 04/16/2021 CLINICAL DATA:  Follow-up right upper lobe lung nodule. Preop for EMB biopsy. EXAM: CT CHEST WITHOUT CONTRAST TECHNIQUE: Multidetector CT imaging of the chest was performed using thin slice collimation for electromagnetic bronchoscopy planning purposes, without intravenous contrast. COMPARISON:  Chest CT 02/23/2021 FINDINGS: Cardiovascular: The heart is within normal limits in size and stable. No pericardial effusion. Stable mild tortuosity, ectasia and calcification of the thoracic aorta. Stable scattered coronary artery calcifications. Mediastinum/Nodes: No mediastinal or hilar mass or adenopathy. The esophagus is grossly normal. Lungs/Pleura: Part solid right upper lobe lung lesion is grossly stable. Mixed ground-glass and cystic type changes surround a more central solid portion. The entire lesion measures approximately 1.5 x 1.2 cm. The more solid central portion measures 7 mm. A few small scattered sub 4 mm nodules are stable. No new pulmonary lesions or acute pulmonary findings. No pleural effusion. Upper Abdomen: No significant upper abdominal findings. Bilateral renal calculi are noted. Stable aortic calcifications. No hepatic or adrenal gland lesions. Musculoskeletal: No breast masses, supraclavicular or axillary adenopathy. The bony thorax is intact. IMPRESSION: 1. Stable part solid right upper lobe lung  lesion. 2. No mediastinal or hilar mass or adenopathy. 3. Stable scattered sub 4 mm pulmonary nodules. 4. Stable atherosclerotic calcifications involving the aorta and coronary arteries. 5. Bilateral renal calculi. Aortic Atherosclerosis (ICD10-I70.0) and Emphysema (ICD10-J43.9). Electronically Signed   By: Marijo Sanes M.D.   On: 04/16/2021 15:55   DG C-ARM BRONCHOSCOPY  Result Date: 04/21/2021 C-ARM BRONCHOSCOPY: Fluoroscopy was utilized by the requesting physician.  No radiographic interpretation.    Assessment/Plan 1. Candidal skin infection - skin under  right breast excoriated/moist - start nystatin powder 100,000 units/gm- apply under right breast bid x 1 week then BID PRN  2. Seizure disorder ([place]) - followed by Dr. Delice Lesch - last seizure 1998 - remains on Topamax and Depakote  3. Right hemiparesis (H[facility]) - hx hemorrhagic stroke at age 73 - avoid blood thinners  - cont PT/OT  4. Labile hypertension - controlled without medication - cont to limit sodium in diet  5. Stage 3a chronic kidney disease ([facility]) - cont to avoid nephrotoxic drugs like NSAIDS and dose adjust medications to be renally excreted - encourage hydration  6. Hyperlipidemia, mixed - LDL 101 04/03/2021  7. Nodule of upper lobe of right lung - followed by pulmonary - Bronchoscopy 04/21/2021- cultures negative/ negative for malignancy  8. Headache, unspecified headache type - migraine free since 2017 - cont tylenol prn  9. Impaired cognition - word finding more - MRI brain 04/13/2021 with unchanged large area of encephalomalacia in left hemisphere - MMSE- future    Family/ staff Communication: plan discussed with patient and nurse  Labs/tests ordered:  MMSE future

## 2021-05-11 ENCOUNTER — Non-Acute Institutional Stay: Payer: Medicare Other | Admitting: Orthopedic Surgery

## 2021-05-11 ENCOUNTER — Encounter: Payer: Self-pay | Admitting: Orthopedic Surgery

## 2021-05-11 DIAGNOSIS — B372 Candidiasis of skin and nail: Secondary | ICD-10-CM

## 2021-05-11 DIAGNOSIS — M25562 Pain in left knee: Secondary | ICD-10-CM | POA: Diagnosis not present

## 2021-05-11 DIAGNOSIS — N1831 Chronic kidney disease, stage 3a: Secondary | ICD-10-CM | POA: Diagnosis not present

## 2021-05-11 DIAGNOSIS — R0989 Other specified symptoms and signs involving the circulatory and respiratory systems: Secondary | ICD-10-CM

## 2021-05-11 DIAGNOSIS — G40909 Epilepsy, unspecified, not intractable, without status epilepticus: Secondary | ICD-10-CM

## 2021-05-11 DIAGNOSIS — G8191 Hemiplegia, unspecified affecting right dominant side: Secondary | ICD-10-CM | POA: Diagnosis not present

## 2021-05-11 DIAGNOSIS — E782 Mixed hyperlipidemia: Secondary | ICD-10-CM

## 2021-05-11 DIAGNOSIS — R911 Solitary pulmonary nodule: Secondary | ICD-10-CM

## 2021-05-11 DIAGNOSIS — R4189 Other symptoms and signs involving cognitive functions and awareness: Secondary | ICD-10-CM | POA: Diagnosis not present

## 2021-05-11 DIAGNOSIS — R519 Headache, unspecified: Secondary | ICD-10-CM | POA: Diagnosis not present

## 2021-05-11 NOTE — Progress Notes (Signed)
Location:   [facility] Room Number: 31-A Place of Service:  ALF [phone]) Provider:  Windell Moulding, NP    Patient Care Team: Unk Pinto, MD as PCP - General (Internal Medicine) Cameron Sprang, MD as Consulting Physician (Neurology) Corliss Parish, MD as Consulting Physician (Nephrology) Raynelle Bring, MD as Consulting Physician (Urology)  Extended Emergency Contact Information Primary Emergency Contact: Barnie Del [place] of [place] Phone: [phone] Mobile Phone: [phone] Relation: Relative Secondary Emergency Contact: [place] Phone: [phone] Mobile Phone: [phone] Relation: Sister  Code Status:  DNR Goals of care: Advanced Directive information Advanced Directives 05/11/2021  Does Patient Have a Medical Advance Directive? Yes  Type of Advance Directive Out of facility DNR (pink MOST or yellow form)  Does patient want to make changes to medical advance directive? No - Patient declined  Copy of [place] in Chart? -     Chief Complaint  Patient presents with   Acute Visit    Left Knee Pain.    HPI:  Tonya Thompson is a 66 y.o. female seen today for an acute visit for left knee pain.   01/08 she was found on the floor in the bathroom. She admits to hitting the back of her head and pain to right shoulder and left knee. She was able to get up with assistance, vitals stable. Today, she reports increased pain to left knee. Pain increased with ambulation. 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 Valeta Harms- culture showed no growth, AFB and fungal culture negative, negative for malignancy Migraines- migraine free since 2017, past use of Fioricet/ prednisone taper/ IV decadron, remains on tylenol at this time  Impaired cognition- word finding more, MRI brain 04/13/2021 with unchanged large area of encephalomalacia in left hemisphere Candidal rash- involves right breast, recently started on nystatin power, reports improved symptoms   Past Medical History:  Diagnosis Date   Acute kidney injury ([place]) 09/03/2019   05-2019 kidney stone removed   Allergy    seasonal allergies   Arthritis    LEFT hand   AVM (arteriovenous malformation) brain    Cataract    bilateral-not a surgical candidate at this time (07/23/2020)   COVID-19 virus infection 08/2020   GERD (gastroesophageal reflux disease)    on meds   Hyperlipidemia    diet controlled   Insulin resistance 02/16/2018   Seizures ([place])    [place] & Partial complex seizure disorder-last 1998   Stroke [facility])    5 total so far (age [phone])   Ureteral stone 05/10/2019   has multiple kidney stones noted from Urologist   Uses roller walker    Past Surgical History:  Procedure Laterality Date   BRAIN SURGERY  09/19/1978   at [facility], Dr. Leida Lauth   BRONCHIAL BIOPSY  04/21/2021  Procedure: BRONCHIAL BIOPSIES;  Surgeon: Garner Nash, DO;  Location: [facility];  Service: Pulmonary;;   BRONCHIAL BRUSHINGS  04/21/2021   Procedure: BRONCHIAL BRUSHINGS;  Surgeon: Garner Nash, DO;  Location: [facility];  Service: Pulmonary;;   BRONCHIAL WASHINGS  04/21/2021   Procedure: BRONCHIAL WASHINGS;  Surgeon:  Garner Nash, DO;  Location: [facility];  Service: Pulmonary;;   COLONOSCOPY  2017   [place], [place]   CYSTOSCOPY/URETEROSCOPY/HOLMIUM LASER/STENT PLACEMENT Left 05/10/2019   Procedure: CYSTOSCOPY/RETROGRADE/URETEROSCOPY/HOLMIUM LASER/STENT PLACEMENT;  Surgeon: Raynelle Bring, MD;  Location: [facility];  Service: Urology;  Laterality: Left;   FIDUCIAL MARKER PLACEMENT  04/21/2021   Procedure: FIDUCIAL MARKER PLACEMENT;  Surgeon: Garner Nash, DO;  Location: [facility];  Service: Pulmonary;;   FOOT SURGERY Right 10/1985   ankle fusion, at [facility] Dr. Lorelee Cover   POLYPECTOMY  2017   multiple adenomas   [place] WITH RADIAL ENDOBRONCHIAL ULTRASOUND  04/21/2021   Procedure: RADIAL ENDOBRONCHIAL ULTRASOUND;  Surgeon: Garner Nash, DO;  Location: [facility];  Service: Pulmonary;;   WISDOM TOOTH EXTRACTION      Allergies  Allergen Reactions   Aspirin Other (See Comments)    CONTRAINDICATED DUE TO HISTORY OF BLEEDING IN BRAIN   Cheese     Cant take due to history of severe migraines   Chocolate     Cant take due to history of severe migraines   Coffee Flavor     Cant take due to history of severe migraines   Other      Nuts - Cant take due to history of severe migraines   Red Wine Complex [Germanium]     Cant take due to history of severe migraines    Allergies as of 05/11/2021       Reactions   Aspirin Other (See Comments)   CONTRAINDICATED DUE TO HISTORY OF BLEEDING IN BRAIN   Cheese    Cant take due to history of severe migraines   Chocolate    Cant take due to history of severe migraines   Coffee Flavor    Cant take due to history of severe migraines   Other     Nuts - Cant take due to history of severe migraines   Red Wine Complex [germanium]    Cant take due to history of severe migraines        Medication List        Accurate as of May 11, 2021  3:59 PM. If you have any questions, ask your nurse or doctor.           acetaminophen 325 MG tablet Commonly known as: TYLENOL Take 650 mg by mouth every 4 (four) hours as needed for moderate pain.   baclofen 10 MG tablet Commonly known as: LIORESAL Take 1 tablet up to four times daily as needed   Calcium Carbonate 500 MG Chew Chew 500 mg by mouth daily.   Calcium Citrate-Vitamin D 315-5 MG-MCG Tabs Take 1 tablet by mouth daily at 6 (six) AM.   divalproex 500 MG 24 hr tablet Commonly known as: DEPAKOTE ER Take 2 tablets every night   fexofenadine 180 MG tablet Commonly known as: ALLEGRA Take 180 mg by mouth daily.   FISH OIL PO Take 1 capsule by mouth daily.   guaiFENesin 600 MG 12 hr tablet Commonly known as: MUCINEX Take 600 mg by mouth 2 (two) times daily as needed for cough.   nystatin powder Commonly known  as: MYCOSTATIN/NYSTOP Apply 1 application topically 2 (two) times daily for 7 days.   Systane Balance 0.6 % Soln Generic drug: Propylene Glycol Place 1 drop into both eyes 2 (two) times daily.   Therems-M Tabs Take 1 tablet by mouth daily.   topiramate 200 MG tablet Commonly known as: TOPAMAX Take 300 mg by mouth 2 (two) times daily.   Vitamin D3 25 MCG tablet Commonly known as: Vitamin D Take 1,000 Units by mouth daily.   zinc oxide 20 % ointment Apply 1 application topically as needed for irritation.        Review of Systems  Constitutional:  Negative for activity change, appetite change, chills, fatigue and fever.  HENT:  Negative for congestion and trouble swallowing.   Eyes:  Negative for visual disturbance.  Respiratory:  Negative for cough, shortness of breath and wheezing.   Cardiovascular:  Negative for chest pain and leg swelling.  Gastrointestinal:  Negative for constipation, diarrhea and nausea.  Genitourinary:  Negative for dysuria, frequency and hematuria.  Musculoskeletal:  Positive for arthralgias and gait problem.  Skin:  Positive for rash.  Neurological:  Positive for dizziness, weakness and  headaches.  Psychiatric/Behavioral:  Positive for confusion. Negative for dysphoric mood. The patient is not nervous/anxious.    Immunization History  Administered Date(s) Administered   Influenza Inj Mdck Quad Pf 02/16/2017   Influenza Inj Mdck Quad With Preservative 02/16/2018, 03/01/2019   Influenza,inj,Quad PF,6+ Mos 03/01/2017   Influenza-Unspecified 03/01/2017, 12/17/2019, 02/24/2021   PFIZER(Purple Top)SARS-COV-2 Vaccination 07/22/2019, 08/12/2019, 03/10/2020, 11/10/2020   Pneumococcal-Unspecified 05/03/2004   Td 10/19/2019   Tdap 08/11/2009   Zoster, Live 04/14/2012   Pertinent  Health Maintenance Due  Topic Date Due   PAP SMEAR-Modifier  12/06/2020   MAMMOGRAM  08/28/2022   COLONOSCOPY (Pts 45-66yrs Insurance coverage will need to be confirmed)  01/09/2024   INFLUENZA VACCINE  Completed   Fall Risk 08/22/2020 09/01/2020 01/27/2021 03/04/2021 04/21/2021  Falls in the past year? - 1 1 - -  Was there an injury with Fall? - 0 0 0 -  Fall Risk Category Calculator - 2 2 - -  Fall Risk Category - Moderate Moderate - -  Patient Fall Risk Level Moderate fall risk High fall risk - Moderate fall risk High fall risk  Patient at Risk for Falls Due to - - History of fall(s);Impaired balance/gait;Impaired mobility - -  Fall risk Follow up - - Education provided;Falls prevention discussed;Falls evaluation completed - -   Functional Status Survey:    Vitals:   05/11/21 1555  BP: 121/83  Pulse: 75  Resp: (!) 22  Temp: (!) 97.3 F (36.3 C)  SpO2: 97%  Weight: 148 lb 6.4 oz (67.3 kg)  Height: 5\' 5"  (1.651 m)   Body mass index is 24.7 kg/m. Physical Exam Vitals reviewed.  Constitutional:      General: She is not in acute distress. HENT:     Head: Normocephalic.  Eyes:     General:        Right eye: No discharge.        Left eye: No discharge.  Neck:     Vascular: No carotid bruit.  Cardiovascular:     Rate and Rhythm: Normal rate and regular rhythm.     Pulses: Normal  pulses.     Heart sounds: Normal heart sounds. No murmur heard. Pulmonary:     Effort: Pulmonary effort is normal. No respiratory distress.     Breath sounds: Normal breath sounds. No  wheezing.  Abdominal:     General: Bowel sounds are normal. There is no distension.     Palpations: Abdomen is soft.     Tenderness: There is no abdominal tenderness.  Musculoskeletal:     Cervical back: Normal range of motion.     Right lower leg: Edema present.     Left lower leg: No edema.     Comments: Right leg brace, left knee with FROM, quarter sized redness and mild swelling over patella, no skin breakdown or deformity.   Lymphadenopathy:     Cervical: No cervical adenopathy.  Skin:    General: Skin is warm and dry.     Capillary Refill: Capillary refill takes less than 2 seconds.     Comments: Skin under right breast without redness  Neurological:     General: No focal deficit present.     Mental Status: She is alert. Mental status is at baseline.     Motor: Weakness present.     Coordination: Coordination abnormal.     Gait: Gait abnormal.     Comments: Walker, right sided weakness  Psychiatric:        Mood and Affect: Mood normal.        Behavior: Behavior normal.     Comments: Word finding    Labs reviewed: Recent Labs    06/26/20 1001 07/24/20 0929 12/19/20 1002 01/27/21 1731 04/03/21 1003  NA 142   < > 141 142 143  K 3.9   < > 3.8 5.4* 3.6  CL 108   < > 109 108 107  CO2 28   < > 26 27 26   GLUCOSE 79   < > 79 93 55*  BUN 31*   < > 26* 27* 33*  CREATININE 1.38*   < > 1.12* 1.23* 1.42*  CALCIUM 10.2   < > 8.8 9.0 9.3  MG 2.1  --  2.0  --  2.2   < > = values in this interval not displayed.   Recent Labs    07/28/20 1258 12/19/20 1002 01/27/21 1731 04/03/21 1003  AST 20 19 26  49*  ALT 28 22 22  31*  ALKPHOS 115  --   --   --   BILITOT 0.8 0.4 0.4 0.4  PROT 7.5 6.3 6.3 6.3  ALBUMIN 3.9  --   --   --    Recent Labs    12/19/20 1002 01/27/21 1731 04/03/21 1003   WBC 4.7 6.2 5.4  NEUTROABS 2,585 3,317 3,029  HGB 13.1 14.0 14.7  HCT 40.4 42.3 44.0  MCV 101.0* 100.7* 103.3*  PLT 204 176 158   Lab Results  Component Value Date   TSH 4.77 (H) 04/03/2021   Lab Results  Component Value Date   HGBA1C 4.8 12/19/2020   Lab Results  Component Value Date   CHOL 184 04/03/2021   HDL 60 04/03/2021   LDLCALC 101 (H) 04/03/2021   TRIG 126 04/03/2021   CHOLHDL 3.1 04/03/2021    Significant Diagnostic Results in last 30 days:  MR ANGIO HEAD WO CONTRAST  Result Date: 04/13/2021 CLINICAL DATA:  Localization-related (focal) (partial) symptomatic epilepsy and epileptic syndromes with complex partial seizures, not intractable, without status epilepticus ([place]) G40.209 (ICD-10-CM). Seizure, stroke. Right hemiparesis ([place]) G81.91 (ICD-10-CM). Migraine without aura and without status migrainosus, not intractable G43.009 (ICD-10-CM). History of arteriovenous malformation (AVM) Z87.74 (ICD-10-CM). EXAM: MRA HEAD WITHOUT CONTRAST TECHNIQUE: Angiographic images of the Circle of Willis were acquired using MRA technique without intravenous contrast.  COMPARISON:  CT angiogram July 24, 2020. FINDINGS: The study is partially degraded by motion. Anterior circulation: Increased tortuosity of the visualized upper cervical segment of the left ACA. The bilateral internal carotid arteries. The bilateral MCA and ACA vascular tree appear preserved. Focal loss of flow related signal at the A1-A2 and distal M2-M3 branches is related to motion artifact. Posterior circulation: The vertebral arteries are widely patent with antegrade flow. Vertebrobasilar junction and basilar artery are widely patent with antegrade flow without evidence of basilar stenosis or aneurysm. Posterior cerebral arteries are normal bilaterally. No intracranial aneurysm within the posterior circulation. Anatomic variants: None significant. Other: None. IMPRESSION: Motion degraded study, otherwise unremarkable MR  angiogram. While no AVM nidus is identified in the current study, this may be related to prominent susceptibility artifact from residual blood products, as seen on concurrent MRI of the brain. Follow-up with CT angiogram is suggested. Electronically Signed   By: Pedro Earls M.D.   On: 04/13/2021 16:10   MR BRAIN WO CONTRAST  Result Date: 04/13/2021 CLINICAL DATA:  seizure, stroke. MR BRAIN WO; MRA HEAD WO; hx of seizures since childhood; no seizure since 1998; brain sx for AVM in 1980; hx of multiple strokes with right sided hemiparesis EXAM: MRI HEAD WITHOUT CONTRAST TECHNIQUE: Multiplanar, multiecho pulse sequences of the brain and surrounding structures were obtained without intravenous contrast. COMPARISON:  08/20/2020 FINDINGS: Brain: No acute infarct, mass effect or extra-axial collection. Unchanged large area encephalomalacia in the left hemisphere with ex vacuo dilatation of the left lateral ventricle. Unchanged areas of periventricular gliosis bilaterally, but much greater on the left than the right. Mild vermian atrophy. Vascular: Major flow voids are preserved. Skull and upper cervical spine: Remote left-sided craniotomy. Sinuses/Orbits:No paranasal sinus fluid levels or advanced mucosal thickening. No mastoid or middle ear effusion. Normal orbits. IMPRESSION: 1. No acute intracranial abnormality. 2. Unchanged large area of encephalomalacia in the left hemisphere with ex vacuo dilatation of the left lateral ventricle. Electronically Signed   By: Ulyses Jarred M.D.   On: 04/13/2021 15:19   DG CHEST PORT 1 VIEW  Result Date: 04/21/2021 CLINICAL DATA:  Post bronchoscopy with biopsy EXAM: PORTABLE CHEST 1 VIEW COMPARISON:  CT 04/15/2021 FINDINGS: Vague airspace opacity in the right upper lobe, likely related to bronchoscopy. No pneumothorax. Heart is normal size. Left lung clear. No effusions or acute bony abnormality. IMPRESSION: No pneumothorax following bronchoscopy.  Electronically Signed   By: Rolm Baptise M.D.   On: 04/21/2021 10:17   CT Super D Chest Wo Contrast  Result Date: 04/16/2021 CLINICAL DATA:  Follow-up right upper lobe lung nodule. Preop for EMB biopsy. EXAM: CT CHEST WITHOUT CONTRAST TECHNIQUE: Multidetector CT imaging of the chest was performed using thin slice collimation for electromagnetic bronchoscopy planning purposes, without intravenous contrast. COMPARISON:  Chest CT 02/23/2021 FINDINGS: Cardiovascular: The heart is within normal limits in size and stable. No pericardial effusion. Stable mild tortuosity, ectasia and calcification of the thoracic aorta. Stable scattered coronary artery calcifications. Mediastinum/Nodes: No mediastinal or hilar mass or adenopathy. The esophagus is grossly normal. Lungs/Pleura: Part solid right upper lobe lung lesion is grossly stable. Mixed ground-glass and cystic type changes surround a more central solid portion. The entire lesion measures approximately 1.5 x 1.2 cm. The more solid central portion measures 7 mm. A few small scattered sub 4 mm nodules are stable. No new pulmonary lesions or acute pulmonary findings. No pleural effusion. Upper Abdomen: No significant upper abdominal findings. Bilateral renal calculi are noted.  Stable aortic calcifications. No hepatic or adrenal gland lesions. Musculoskeletal: No breast masses, supraclavicular or axillary adenopathy. The bony thorax is intact. IMPRESSION: 1. Stable part solid right upper lobe lung lesion. 2. No mediastinal or hilar mass or adenopathy. 3. Stable scattered sub 4 mm pulmonary nodules. 4. Stable atherosclerotic calcifications involving the aorta and coronary arteries. 5. Bilateral renal calculi. Aortic Atherosclerosis (ICD10-I70.0) and Emphysema (ICD10-J43.9). Electronically Signed   By: Marijo Sanes M.D.   On: 04/16/2021 15:55   DG C-ARM BRONCHOSCOPY  Result Date: 04/21/2021 C-ARM BRONCHOSCOPY: Fluoroscopy was utilized by the requesting physician.  No  radiographic interpretation.    Assessment/Plan 1. Acute pain of left knee - FROM, bruise to patella, reports throbbing pain with ambulation - Xray left knee - start tylenol 1000 mg po bid x 14 days - start tylenol 1000 mg po daily prn x 14 days for additional pain  2. Seizure disorder [facility]) - followed by dr. Delice Lesch - last seizure 1998 - cont Topamax and Depakote  3. Right hemiparesis (HCC) - hx hemorrhagic stoke at age 52 - avoid blood thinners - cont Tonya Thompson/OT  4. Labile hypertension - controlled - limit sodium in diet  5. Stage 3a chronic kidney disease (HCC) - cont to avoid nephrotoxic drugs like NSAIDS and dose adjust medications to be renally excreted - encourage hydration  6. Hyperlipidemia, mixed - LDL 101 04/03/2021  7. Nodule of upper lobe of right lung - followed by pulmonary - bronchoscopy 04/21/2021- cultures negative/biopsy negative for malignancy   8. Headache, unspecified headache type - no episodes since 2017 - cont tylenol prn  9. Impaired cognition - increased word finding - MMSE- future  10. Candidal skin infection - improved with nystatin powder  Total time: 31 minutes  Family/ staff Communication: plan discussed with patient and nurse  Labs/tests ordered:   Left knee xray

## 2021-05-20 LAB — FUNGUS CULTURE WITH STAIN

## 2021-05-20 LAB — FUNGUS CULTURE RESULT

## 2021-05-20 LAB — FUNGAL ORGANISM REFLEX

## 2021-05-22 ENCOUNTER — Encounter: Payer: Self-pay | Admitting: Orthopedic Surgery

## 2021-05-22 ENCOUNTER — Non-Acute Institutional Stay: Payer: Medicare Other | Admitting: Orthopedic Surgery

## 2021-05-22 DIAGNOSIS — R4189 Other symptoms and signs involving cognitive functions and awareness: Secondary | ICD-10-CM

## 2021-05-22 DIAGNOSIS — R0989 Other specified symptoms and signs involving the circulatory and respiratory systems: Secondary | ICD-10-CM

## 2021-05-22 DIAGNOSIS — E782 Mixed hyperlipidemia: Secondary | ICD-10-CM

## 2021-05-22 DIAGNOSIS — R911 Solitary pulmonary nodule: Secondary | ICD-10-CM | POA: Diagnosis not present

## 2021-05-22 DIAGNOSIS — Z8669 Personal history of other diseases of the nervous system and sense organs: Secondary | ICD-10-CM | POA: Diagnosis not present

## 2021-05-22 DIAGNOSIS — N1831 Chronic kidney disease, stage 3a: Secondary | ICD-10-CM

## 2021-05-22 DIAGNOSIS — G8191 Hemiplegia, unspecified affecting right dominant side: Secondary | ICD-10-CM | POA: Diagnosis not present

## 2021-05-22 DIAGNOSIS — B372 Candidiasis of skin and nail: Secondary | ICD-10-CM | POA: Diagnosis not present

## 2021-05-22 DIAGNOSIS — R7989 Other specified abnormal findings of blood chemistry: Secondary | ICD-10-CM | POA: Diagnosis not present

## 2021-05-22 DIAGNOSIS — G40909 Epilepsy, unspecified, not intractable, without status epilepticus: Secondary | ICD-10-CM

## 2021-05-22 DIAGNOSIS — M25562 Pain in left knee: Secondary | ICD-10-CM | POA: Diagnosis not present

## 2021-05-22 NOTE — [place] Notes (Signed)
Location:  [place] Room Number: [place] of Service:  ALF [phone]) Provider:  Windell Moulding, AGNP-C  Unk Pinto, MD  Patient Care Team: Unk Pinto, MD as PCP - General (Internal Medicine) Cameron Sprang, MD as Consulting Physician (Neurology) Corliss Parish, MD as Consulting Physician (Nephrology) Raynelle Bring, MD as Consulting Physician (Urology)  Extended Emergency Contact Information Primary Emergency Contact: Barnie Del [place] of [place] Phone: [phone] Mobile Phone: [phone] Relation: Relative Secondary Emergency Contact: [place] Phone: [phone] Mobile Phone: [phone] Relation: Sister  Code Status:  DNR Goals of care: Advanced Directive information Advanced Directives 05/11/2021  Does Patient Have a Medical Advance Directive? Yes  Type of Advance Directive Out of facility DNR (pink MOST or yellow form)  Does patient want to make changes to medical advance directive? No - Patient declined  Copy of [place] in Chart? -     Chief Complaint  Patient presents with   Medical Management of Chronic Issues    HPI:  Pt is a 66 y.o. female seen today for medical management of chronic diseases.   She currently resides on the assisted living unit at [facility]. 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   Recent blood pressures:  01/18- 104/80  01/11- 107/80  01/08- 121/83  Recent weights:  01/05- 148.4 lbs  12/01- 147.8 lbs  10/19- 146.8 lbs     Past Medical History:  Diagnosis Date   Acute kidney injury ([place]) 09/03/2019   05-2019 kidney stone removed   Allergy    seasonal allergies   Arthritis    LEFT hand   AVM (arteriovenous malformation) brain    Cataract    bilateral-not a surgical candidate at this time (07/23/2020)   COVID-19 virus infection 08/2020   GERD (gastroesophageal reflux disease)    on meds   Hyperlipidemia    diet controlled   Insulin resistance 02/16/2018   Seizures ([place])    Grand Mal & Partial complex seizure disorder-last 1998   Stroke [facility])    5 total so far (age [phone])   Ureteral stone 05/10/2019   has multiple kidney stones noted from Urologist   Uses roller walker    Past Surgical History:  Procedure  Laterality Date   BRAIN SURGERY  09/19/1978   at [facility], Dr. Leida Lauth   BRONCHIAL BIOPSY  04/21/2021   Procedure: BRONCHIAL BIOPSIES;  Surgeon: Garner Nash, DO;  Location: [facility];  Service: Pulmonary;;   BRONCHIAL  BRUSHINGS  04/21/2021   Procedure: BRONCHIAL BRUSHINGS;  Surgeon: Garner Nash, DO;  Location: [facility];  Service: Pulmonary;;   BRONCHIAL WASHINGS  04/21/2021   Procedure: BRONCHIAL WASHINGS;  Surgeon: Garner Nash, DO;  Location: [facility];  Service: Pulmonary;;   COLONOSCOPY  2017   [place] [place]   CYSTOSCOPY/URETEROSCOPY/HOLMIUM LASER/STENT PLACEMENT Left 05/10/2019   Procedure: CYSTOSCOPY/RETROGRADE/URETEROSCOPY/HOLMIUM LASER/STENT PLACEMENT;  Surgeon: Raynelle Bring, MD;  Location: [facility];  Service: Urology;  Laterality: Left;   FIDUCIAL MARKER PLACEMENT  04/21/2021   Procedure: FIDUCIAL MARKER PLACEMENT;  Surgeon: Garner Nash, DO;  Location: [facility];  Service: Pulmonary;;   FOOT SURGERY Right 10/1985   ankle fusion, at [facility], Dr. Lorelee Cover   POLYPECTOMY  2017   multiple adenomas   [place] WITH RADIAL ENDOBRONCHIAL ULTRASOUND  04/21/2021   Procedure: RADIAL ENDOBRONCHIAL ULTRASOUND;  Surgeon: Garner Nash, DO;  Location: [place];  Service: Pulmonary;;   WISDOM TOOTH EXTRACTION      Allergies  Allergen Reactions   Aspirin Other (See Comments)    CONTRAINDICATED DUE TO HISTORY OF BLEEDING IN BRAIN   Cheese     Cant take due to history of severe migraines   Chocolate     Cant take due to history of severe migraines   Coffee Flavor     Cant take due to history of severe migraines   Other      Nuts - Cant take due to history of severe migraines   Red Wine Complex [Germanium]     Cant take due to history of severe migraines    Outpatient Encounter Medications as of 05/22/2021  Medication Sig   acetaminophen (TYLENOL) 325 MG tablet Take 650 mg by mouth every 4 (four) hours as needed for moderate pain.   baclofen (LIORESAL) 10 MG tablet Take 1 tablet up to four times daily as needed   Calcium Carbonate 500 MG CHEW Chew 500 mg by mouth daily.   Calcium Citrate-Vitamin D 315-5 MG-MCG TABS Take 1 tablet by mouth daily at 6  (six) AM.   divalproex (DEPAKOTE ER) 500 MG 24 hr tablet Take 2 tablets every night   fexofenadine (ALLEGRA) 180 MG tablet Take 180 mg by mouth daily.   guaiFENesin (MUCINEX) 600 MG 12 hr tablet Take 600 mg by mouth 2 (two) times daily as needed for cough.   Multiple Vitamins-Minerals (THEREMS-M) TABS Take 1 tablet by mouth daily.   Omega-3 Fatty Acids (FISH OIL PO) Take 1 capsule by mouth daily.   Propylene Glycol (SYSTANE BALANCE) 0.6 % SOLN Place 1 drop into both eyes 2 (two) times daily.   topiramate (TOPAMAX) 200 MG tablet Take 300 mg by mouth 2 (two) times daily.   Vitamin D3 (VITAMIN D) 25 MCG tablet Take 1,000 Units by mouth daily.   zinc oxide 20 % ointment Apply 1 application topically as needed for irritation.   No facility-administered encounter medications on file as of 05/22/2021.    Review of Systems  Constitutional:  Negative for activity change, appetite change, chills, fatigue and fever.  HENT:  Positive for sneezing. Negative for dental problem and trouble swallowing.   Eyes:  Negative for visual disturbance.  Respiratory:  Negative  for cough, shortness of breath and wheezing.   Cardiovascular:  Negative for chest pain and leg swelling.  Gastrointestinal:  Negative for abdominal distention, abdominal pain, blood in stool, constipation, diarrhea and nausea.  Genitourinary:  Negative for dysuria, frequency and hematuria.  Musculoskeletal:  Positive for arthralgias and gait problem.  Skin:  Negative for wound.  Neurological:  Positive for seizures, weakness and headaches. Negative for dizziness.  Psychiatric/Behavioral:  Positive for confusion. Negative for agitation, dysphoric mood and sleep disturbance. The patient is not nervous/anxious.    Immunization History  Administered Date(s) Administered   Influenza Inj Mdck Quad Pf 02/16/2017   Influenza Inj Mdck Quad With Preservative 02/16/2018, 03/01/2019   Influenza,inj,Quad PF,6+ Mos 03/01/2017   Influenza-Unspecified  03/01/2017, 12/17/2019, 02/24/2021   PFIZER(Purple Top)SARS-COV-2 Vaccination 07/22/2019, 08/12/2019, 03/10/2020, 11/10/2020   Pneumococcal-Unspecified 05/03/2004   Td 10/19/2019   Tdap 08/11/2009   Zoster, Live 04/14/2012   Pertinent  Health Maintenance Due  Topic Date Due   PAP SMEAR-Modifier  12/06/2020   MAMMOGRAM  08/28/2022   COLONOSCOPY (Pts 45-76yrs Insurance coverage will need to be confirmed)  01/09/2024   INFLUENZA VACCINE  Completed   Fall Risk 08/22/2020 09/01/2020 01/27/2021 03/04/2021 04/21/2021  Falls in the past year? - 1 1 - -  Was there an injury with Fall? - 0 0 0 -  Fall Risk Category Calculator - 2 2 - -  Fall Risk Category - Moderate Moderate - -  Patient Fall Risk Level Moderate fall risk High fall risk - Moderate fall risk High fall risk  Patient at Risk for Falls Due to - - History of fall(s);Impaired balance/gait;Impaired mobility - -  Fall risk Follow up - - Education provided;Falls prevention discussed;Falls evaluation completed - -   Functional Status Survey:    Vitals:   05/22/21 1613  BP: 104/80  Pulse: 77  Resp: 16  Temp: (!) 93.7 F (34.3 C)  SpO2: 97%  Weight: 148 lb 6.4 oz (67.3 kg)   Body mass index is 24.7 kg/m. Physical Exam Vitals reviewed.  Constitutional:      General: She is not in acute distress. HENT:     Head: Normocephalic.     Right Ear: There is no impacted cerumen.     Left Ear: There is no impacted cerumen.     Nose: Nose normal.     Mouth/Throat:     Mouth: Mucous membranes are moist.  Eyes:     General:        Right eye: No discharge.        Left eye: No discharge.  Neck:     Thyroid: No thyroid mass or thyromegaly.     Vascular: No carotid bruit.  Cardiovascular:     Rate and Rhythm: Normal rate and regular rhythm.     Pulses: Normal pulses.     Heart sounds: Normal heart sounds. No murmur heard. Pulmonary:     Effort: Pulmonary effort is normal. No respiratory distress.     Breath sounds: Normal breath  sounds. No wheezing.  Abdominal:     General: Bowel sounds are normal. There is no distension.     Palpations: Abdomen is soft.     Tenderness: There is no abdominal tenderness.  Musculoskeletal:     Cervical back: Normal range of motion.     Left knee: No swelling, erythema or crepitus. Normal range of motion. Tenderness present over the patellar tendon.     Right lower leg: No edema.  Left lower leg: No edema.     Comments: Right foot brace, compression socks  Lymphadenopathy:     Cervical: No cervical adenopathy.  Skin:    General: Skin is warm and dry.     Capillary Refill: Capillary refill takes less than 2 seconds.     Comments: Skin under breasts CDI, no erythema, moisture or breakdown  Neurological:     General: No focal deficit present.     Mental Status: She is alert. Mental status is at baseline.     Motor: Weakness present.     Gait: Gait abnormal.     Comments: Rolator, right hand grip 1/5, left hand grip 5/5  Psychiatric:        Mood and Affect: Mood normal.        Behavior: Behavior normal.        Cognition and Memory: Memory is impaired.    Labs reviewed: Recent Labs    06/26/20 1001 07/24/20 0929 12/19/20 1002 01/27/21 1731 04/03/21 1003  NA 142   < > 141 142 143  K 3.9   < > 3.8 5.4* 3.6  CL 108   < > 109 108 107  CO2 28   < > 26 27 26   GLUCOSE 79   < > 79 93 55*  BUN 31*   < > 26* 27* 33*  CREATININE 1.38*   < > 1.12* 1.23* 1.42*  CALCIUM 10.2   < > 8.8 9.0 9.3  MG 2.1  --  2.0  --  2.2   < > = values in this interval not displayed.   Recent Labs    07/28/20 1258 12/19/20 1002 01/27/21 1731 04/03/21 1003  AST 20 19 26  49*  ALT 28 22 22  31*  ALKPHOS 115  --   --   --   BILITOT 0.8 0.4 0.4 0.4  PROT 7.5 6.3 6.3 6.3  ALBUMIN 3.9  --   --   --    Recent Labs    12/19/20 1002 01/27/21 1731 04/03/21 1003  WBC 4.7 6.2 5.4  NEUTROABS 2,585 3,317 3,029  HGB 13.1 14.0 14.7  HCT 40.4 42.3 44.0  MCV 101.0* 100.7* 103.3*  PLT 204 176 158    Lab Results  Component Value Date   TSH 4.77 (H) 04/03/2021   Lab Results  Component Value Date   HGBA1C 4.8 12/19/2020   Lab Results  Component Value Date   CHOL 184 04/03/2021   HDL 60 04/03/2021   LDLCALC 101 (H) 04/03/2021   TRIG 126 04/03/2021   CHOLHDL 3.1 04/03/2021    Significant Diagnostic Results in last 30 days:  No results found.  Assessment/Plan 1. Seizure disorder ([place]) - followed by neuro - cont Topamax and Depakote  2. Right hemiparesis ([place]) - cont to avoid blood thinners - cont PT/OT  3. Labile hypertension - controlled without meds - cont low sodium diet  4. Stage 3a chronic kidney disease ([facility]) - cont to avoid nephrotoxic drugs like NSAIDS and dose adjust medications to be renally excreted - encourage hydration  5. Hyperlipidemia, mixed - LDL 101 04/03/2021 - cont low fat diet  6. Nodule of upper lobe of right lung - bronchoscopy 04/21/2021- cultures negative/biopsy negative for malignancy   7. Hx of migraines - no migraines since 2017 - cont tylenol prn  8. Impaired cognition - no behavioral outbursts - MMSE- pending  9. Acute pain of left knee - fall 01/08 - pain improved - xray unremarkable - tenderness  over patella - cont tylenol prn  10. Candidal skin infection - resolved  11. Elevated LFTs - elevated AST/ALT - hepatic panel- future    Family/ staff Communication: plan discussed with patient and nurse  Labs/tests ordered:  none

## 2021-05-28 DIAGNOSIS — R7401 Elevation of levels of liver transaminase levels: Secondary | ICD-10-CM | POA: Diagnosis not present

## 2021-06-04 DIAGNOSIS — G40409 Other generalized epilepsy and epileptic syndromes, not intractable, without status epilepticus: Secondary | ICD-10-CM | POA: Diagnosis not present

## 2021-06-05 LAB — ACID FAST CULTURE WITH REFLEXED SENSITIVITIES (MYCOBACTERIA): Acid Fast Culture: NEGATIVE

## 2021-06-09 ENCOUNTER — Non-Acute Institutional Stay: Payer: Medicare Other | Admitting: Nurse Practitioner

## 2021-06-09 ENCOUNTER — Encounter: Payer: Self-pay | Admitting: Nurse Practitioner

## 2021-06-09 DIAGNOSIS — R4189 Other symptoms and signs involving cognitive functions and awareness: Secondary | ICD-10-CM

## 2021-06-09 DIAGNOSIS — G43909 Migraine, unspecified, not intractable, without status migrainosus: Secondary | ICD-10-CM | POA: Insufficient documentation

## 2021-06-09 DIAGNOSIS — I2510 Atherosclerotic heart disease of native coronary artery without angina pectoris: Secondary | ICD-10-CM | POA: Diagnosis not present

## 2021-06-09 DIAGNOSIS — R269 Unspecified abnormalities of gait and mobility: Secondary | ICD-10-CM | POA: Diagnosis not present

## 2021-06-09 DIAGNOSIS — E782 Mixed hyperlipidemia: Secondary | ICD-10-CM | POA: Diagnosis not present

## 2021-06-09 DIAGNOSIS — N1831 Chronic kidney disease, stage 3a: Secondary | ICD-10-CM | POA: Diagnosis not present

## 2021-06-09 DIAGNOSIS — G3184 Mild cognitive impairment, so stated: Secondary | ICD-10-CM | POA: Insufficient documentation

## 2021-06-09 DIAGNOSIS — R0989 Other specified symptoms and signs involving the circulatory and respiratory systems: Secondary | ICD-10-CM

## 2021-06-09 DIAGNOSIS — G8191 Hemiplegia, unspecified affecting right dominant side: Secondary | ICD-10-CM

## 2021-06-09 DIAGNOSIS — R918 Other nonspecific abnormal finding of lung field: Secondary | ICD-10-CM | POA: Diagnosis not present

## 2021-06-09 DIAGNOSIS — R7989 Other specified abnormal findings of blood chemistry: Secondary | ICD-10-CM | POA: Diagnosis not present

## 2021-06-09 DIAGNOSIS — I2584 Coronary atherosclerosis due to calcified coronary lesion: Secondary | ICD-10-CM | POA: Diagnosis not present

## 2021-06-09 DIAGNOSIS — G40409 Other generalized epilepsy and epileptic syndromes, not intractable, without status epilepticus: Secondary | ICD-10-CM | POA: Diagnosis not present

## 2021-06-09 NOTE — Assessment & Plan Note (Signed)
difficulty word findings/forming sentences. 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  TSH 4.77 04/03/21

## 2021-06-09 NOTE — Assessment & Plan Note (Signed)
f/u Pulmonology, R upper lobe nodule, had bronchoscopy 04/21/21-negative for malignancy or infection.

## 2021-06-09 NOTE — Assessment & Plan Note (Signed)
Blood pressure is controlled, no meds.

## 2021-06-09 NOTE — Progress Notes (Addendum)
Location:   [place] Number: 22 Place of Service:  ALF (13) Provider: Lennie Odor Letrice Pollok NP  Unk Pinto, MD  Patient Care Team: Unk Pinto, MD as PCP - General (Internal Medicine) Werner Lean, MD as PCP - Cardiology (Cardiology) Cameron Sprang, MD as Consulting Physician (Neurology) Corliss Parish, MD as Consulting Physician (Nephrology) Raynelle Bring, MD as Consulting Physician (Urology)  Extended Emergency Contact Information Primary Emergency Contact: Barnie Del [place] of [place] Phone: [phone] Mobile Phone: [phone] Relation: Relative Secondary Emergency Contact: [place] Phone: [phone] Mobile Phone: [phone] Relation: Sister  Code Status:  DNR Goals of care: Advanced Directive information Advanced Directives 05/11/2021  Does Patient Have a Medical Advance Directive? Yes  Type of Advance Directive Out of facility DNR (pink MOST or yellow form)  Does patient want to make changes to medical advance directive? No - Patient declined  Copy of [place] in Chart? -     Chief Complaint  Patient presents with   Acute Visit    Elevated valproic acid level, c/o sleepiness.     HPI:  Pt is a 66 y.o. female seen today for an acute visit for elevated valproic acid level 10/6.4 06/04/21. The patient stated she naps between meals and after dinner too, the patient falls to sleep when talking during my visit today.   CAD, no angina.   HTN controlled w/o meds.   HLD on diet, Fish Oil, LDL 101 04/03/21  Elevated LFT AST/ALT 49/31 04/03/21  Impaired cognition, difficulty word findings/forming sentences. 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  TSH 4.77 04/03/21 Migraines, no flare ups since 2017, Tylenol is effected, treated with steroids in the past.   Pulmonary nodules, f/u Pulmonology, R upper lobe nodule, had bronchoscopy 04/21/21-negative for  malignancy or infection.   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   CKD III, Bun/creat 33/1.42 04/03/21  Gait abnormality, ambulates with walker, R ankle brace.    Past Medical History:  Diagnosis Date   Acute kidney injury ([place]) 09/03/2019   05-2019 kidney stone removed   Allergy    seasonal allergies   Arthritis    LEFT hand   AVM (arteriovenous malformation) brain    Cataract    bilateral-not a surgical candidate at this time (07/23/2020)   COVID-19 virus infection 08/2020   GERD (gastroesophageal reflux disease)    on meds   Hyperlipidemia    diet controlled   Insulin resistance 02/16/2018   Seizures ([place])    Grand Mal & Partial complex seizure disorder-last 1998   Stroke [facility])    5 total so far (age [phone])   Ureteral stone 05/10/2019   has multiple kidney stones noted from Urologist   Uses roller walker    Past Surgical History:  Procedure Laterality Date   BRAIN SURGERY  09/19/1978   at [facility], Dr. Leida Lauth   BRONCHIAL BIOPSY  04/21/2021   Procedure: BRONCHIAL BIOPSIES;  Surgeon: Garner Nash, DO;  Location: [place] [facility];  Service: Pulmonary;;   BRONCHIAL BRUSHINGS  04/21/2021   Procedure: BRONCHIAL BRUSHINGS;  Surgeon: Garner Nash, DO;  Location: [place] [facility];  Service: Pulmonary;;   BRONCHIAL WASHINGS  04/21/2021   Procedure: BRONCHIAL WASHINGS;  Surgeon: Garner Nash, DO;  Location: [place] [facility];  Service: Pulmonary;;  COLONOSCOPY  2017   [place], [place]   CYSTOSCOPY/URETEROSCOPY/HOLMIUM LASER/STENT PLACEMENT Left 05/10/2019   Procedure: CYSTOSCOPY/RETROGRADE/URETEROSCOPY/HOLMIUM LASER/STENT PLACEMENT;  Surgeon: Raynelle Bring, MD;  [facility];  Service: Urology;  Laterality: Left;   FIDUCIAL MARKER PLACEMENT  04/21/2021   Procedure: FIDUCIAL MARKER PLACEMENT;  Surgeon:  Garner Nash, DO;  Location: [place] [facility];  Service: Pulmonary;;   FOOT SURGERY Right 10/1985   ankle fusion, at [facility], Dr. Lorelee Cover   POLYPECTOMY  2017   multiple adenomas   [place] WITH RADIAL ENDOBRONCHIAL ULTRASOUND  04/21/2021   Procedure: RADIAL ENDOBRONCHIAL ULTRASOUND;  Surgeon: Garner Nash, DO;  Location: [place];  Service: Pulmonary;;   WISDOM TOOTH EXTRACTION      Allergies  Allergen Reactions   Aspirin Other (See Comments)    CONTRAINDICATED DUE TO HISTORY OF BLEEDING IN BRAIN   Cheese     Cant take due to history of severe migraines   Chocolate     Cant take due to history of severe migraines   Coffee Flavor     Cant take due to history of severe migraines   Other      Nuts - Cant take due to history of severe migraines   Red Wine Complex [Germanium]     Cant take due to history of severe migraines    Allergies as of 06/09/2021       Reactions   Aspirin Other (See Comments)   CONTRAINDICATED DUE TO HISTORY OF BLEEDING IN BRAIN   Cheese    Cant take due to history of severe migraines   Chocolate    Cant take due to history of severe migraines   Coffee Flavor    Cant take due to history of severe migraines   Other     Nuts - Cant take due to history of severe migraines   Red Wine Complex [germanium]    Cant take due to history of severe migraines        Medication List        Accurate as of June 09, 2021 11:59 PM. If you have any questions, ask your nurse or doctor.          acetaminophen 325 MG tablet Commonly known as: TYLENOL Take 650 mg by mouth every 4 (four) hours as needed for moderate pain.   baclofen 10 MG tablet Commonly known as: LIORESAL Take 1 tablet up to four times daily as needed   Calcium Carbonate 500 MG Chew Chew 500 mg by mouth daily.   Calcium Citrate-Vitamin D 315-5 MG-MCG Tabs Take 1 tablet by mouth daily at 6 (six) AM.   divalproex 500 MG 24 hr  tablet Commonly known as: DEPAKOTE ER Take 2 tablets every night   fexofenadine 180 MG tablet Commonly known as: ALLEGRA Take 180 mg by mouth daily.   FISH OIL PO Take 1 capsule by mouth daily.   guaiFENesin 600 MG 12 hr tablet Commonly known as: MUCINEX Take 600 mg by mouth 2 (two) times daily as needed for cough.   nystatin powder Commonly known as: MYCOSTATIN/NYSTOP Apply 1 application topically 2 (two) times daily as needed.   Systane Balance 0.6 % Soln Generic drug: Propylene Glycol Place 1 drop into both eyes 2 (two) times daily.   Therems-M Tabs Take 1 tablet by mouth daily.   topiramate 200 MG tablet Commonly known as: TOPAMAX Take 300 mg by mouth 2 (two) times daily.   Vitamin D3 25  MCG tablet Commonly known as: Vitamin D Take 1,000 Units by mouth daily.   zinc oxide 20 % ointment Apply 1 application topically as needed for irritation.        Review of Systems  Constitutional:  Positive for fatigue. Negative for appetite change and fever.       Sleepy  HENT:  Negative for congestion and trouble swallowing.   Eyes:  Negative for visual disturbance.  Respiratory:  Negative for cough, shortness of breath and wheezing.   Cardiovascular:  Positive for leg swelling.  Gastrointestinal:  Negative for abdominal pain and constipation.  Genitourinary:  Negative for dysuria, frequency and urgency.       Bathroom 2x/night at her baseline.   Musculoskeletal:  Positive for arthralgias and gait problem.  Skin:  Negative for color change.  Neurological:  Positive for speech difficulty and weakness. Negative for seizures and headaches.       Difficulty words finding, memory lapses.   Psychiatric/Behavioral:  Negative for behavioral problems, hallucinations and sleep disturbance. The patient is not nervous/anxious.    Immunization History  Administered Date(s) Administered   Influenza Inj Mdck Quad Pf 02/16/2017   Influenza Inj Mdck Quad With Preservative  02/16/2018, 03/01/2019   Influenza,inj,Quad PF,6+ Mos 03/01/2017   Influenza-Unspecified 03/01/2017, 12/17/2019, 02/24/2021   PFIZER(Purple Top)SARS-COV-2 Vaccination 07/22/2019, 08/12/2019, 03/10/2020, 11/10/2020   Pneumococcal-Unspecified 05/03/2004   Td 10/19/2019   Tdap 08/11/2009   Zoster, Live 04/14/2012   Pertinent  Health Maintenance Due  Topic Date Due   PAP SMEAR-Modifier  12/06/2020   MAMMOGRAM  08/28/2022   COLONOSCOPY (Pts 45-42yrs Insurance coverage will need to be confirmed)  01/09/2024   INFLUENZA VACCINE  Completed   Fall Risk 08/22/2020 09/01/2020 01/27/2021 03/04/2021 04/21/2021  Falls in the past year? - 1 1 - -  Was there an injury with Fall? - 0 0 0 -  Fall Risk Category Calculator - 2 2 - -  Fall Risk Category - Moderate Moderate - -  Patient Fall Risk Level Moderate fall risk High fall risk - Moderate fall risk High fall risk  Patient at Risk for Falls Due to - - History of fall(s);Impaired balance/gait;Impaired mobility - -  Fall risk Follow up - - Education provided;Falls prevention discussed;Falls evaluation completed - -   Functional Status Survey:    Vitals:   06/09/21 1211  BP: 122/85  Pulse: 77  Resp: 16  Temp: (!) 97.5 F (36.4 C)  SpO2: 95%   There is no height or weight on file to calculate BMI. Physical Exam Vitals and nursing note reviewed.  Constitutional:      Comments: sleepy  HENT:     Head: Normocephalic and atraumatic.     Nose: Nose normal.     Mouth/Throat:     Mouth: Mucous membranes are moist.  Eyes:     Extraocular Movements: Extraocular movements intact.     Conjunctiva/sclera: Conjunctivae normal.     Pupils: Pupils are equal, round, and reactive to light.  Cardiovascular:     Rate and Rhythm: Normal rate and regular rhythm.     Heart sounds: No murmur heard. Pulmonary:     Effort: Pulmonary effort is normal.     Breath sounds: No rales.  Abdominal:     General: Bowel sounds are normal.     Palpations:  Abdomen is soft.     Tenderness: There is no abdominal tenderness.  Musculoskeletal:        General: Deformity present.  Cervical back: Normal range of motion and neck supple.     Right lower leg: Edema present.     Left lower leg: No edema.     Comments: Race edema RLE  Skin:    General: Skin is warm and dry.  Neurological:     General: No focal deficit present.     Mental Status: She is alert and oriented to person, place, and time. Mental status is at baseline.     Cranial Nerves: No cranial nerve deficit.     Motor: Weakness present.     Coordination: Coordination abnormal.     Gait: Gait abnormal.     Comments: Right wrist contracture, muscle strength 1/5 RUE, 4-5 RLE. Uses R ankle brace. Ambulate with walker.   Psychiatric:        Mood and Affect: Mood normal.        Behavior: Behavior normal.        Thought Content: Thought content normal.    Labs reviewed: Recent Labs    06/26/20 1001 07/24/20 0929 12/19/20 1002 01/27/21 1731 04/03/21 1003  NA 142   < > 141 142 143  K 3.9   < > 3.8 5.4* 3.6  CL 108   < > 109 108 107  CO2 28   < > 26 27 26   GLUCOSE 79   < > 79 93 55*  BUN 31*   < > 26* 27* 33*  CREATININE 1.38*   < > 1.12* 1.23* 1.42*  CALCIUM 10.2   < > 8.8 9.0 9.3  MG 2.1  --  2.0  --  2.2   < > = values in this interval not displayed.   Recent Labs    07/28/20 1258 12/19/20 1002 01/27/21 1731 04/03/21 1003 06/17/21 1132  AST 20   < > 26 49* 37  ALT 28   < > 22 31* 22  ALKPHOS 115  --   --   --  88  BILITOT 0.8   < > 0.4 0.4 0.2  PROT 7.5   < > 6.3 6.3 5.7*  ALBUMIN 3.9  --   --   --  3.4*   < > = values in this interval not displayed.   Recent Labs    12/19/20 1002 01/27/21 1731 04/03/21 1003  WBC 4.7 6.2 5.4  NEUTROABS 2,585 3,317 3,029  HGB 13.1 14.0 14.7  HCT 40.4 42.3 44.0  MCV 101.0* 100.7* 103.3*  PLT 204 176 158   Lab Results  Component Value Date   TSH 4.77 (H) 04/03/2021   Lab Results  Component Value Date   HGBA1C 4.8  12/19/2020   Lab Results  Component Value Date   CHOL 158 06/17/2021   HDL 63 06/17/2021   LDLCALC 80 06/17/2021   TRIG 77 06/17/2021   CHOLHDL 2.5 06/17/2021    Significant Diagnostic Results in last 30 days:  No results found.  Assessment/Plan Epilepsy, grand mal ([place]) elevated valproic acid level 10/6.4 06/04/21. The patient stated she naps between meals and after dinner too, the patient falls to sleep when talking during my visit today. Will repeat Valproic acid level in 2 weeks. Will forward the result to Neurology for further recommendation. Observe the patient.  06/22/21 Valproic acid 102.6  Coronary artery calcification No angina reported.   Labile hypertension Blood pressure is controlled, no meds.   Hyperlipidemia, mixed on diet, Fish Oil, LDL 101 04/03/21  Elevated LFTs Elevated LFT AST/ALT 49/31 04/03/21  Impaired cognition  difficulty word findings/forming sentences. 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  TSH 4.77 04/03/21  Migraine no flare ups since 2017, Tylenol is effected, treated with steroids in the past.   Pulmonary nodules  f/u Pulmonology, R upper lobe nodule, had bronchoscopy 04/21/21-negative for malignancy or infection.   Right hemiparesis ([place]) as an result of a hemorrhagic stroke at age of 33 and subsequent strokes x5 per patient.   CKD (chronic kidney disease) stage 3, GFR 30-59 ml/min Bun/creat 33/1.42 04/03/21  Gait abnormality ambulates with walker, R ankle brace.     Family/ staff Communication: plan of care reviewed with the patient and charge nurse.   Labs/tests ordered: Valproic acid level 2 weeks  Time spend 40 minutes.

## 2021-06-09 NOTE — Assessment & Plan Note (Signed)
Elevated LFT AST/ALT 49/31 04/03/21

## 2021-06-09 NOTE — Assessment & Plan Note (Addendum)
elevated valproic acid level 10/6.4 06/04/21. The patient stated she naps between meals and after dinner too, the patient falls to sleep when talking during my visit today. Will repeat Valproic acid level in 2 weeks. Will forward the result to Neurology for further recommendation. Observe the patient.  06/22/21 Valproic acid 102.6

## 2021-06-09 NOTE — Assessment & Plan Note (Signed)
ambulates with walker, R ankle brace.

## 2021-06-09 NOTE — Assessment & Plan Note (Signed)
on diet, Fish Oil, LDL 101 04/03/21

## 2021-06-09 NOTE — Assessment & Plan Note (Signed)
No angina reported.

## 2021-06-09 NOTE — Assessment & Plan Note (Signed)
no flare ups since 2017, Tylenol is effected, treated with steroids in the past.

## 2021-06-09 NOTE — Assessment & Plan Note (Signed)
Bun/creat 33/1.42 04/03/21

## 2021-06-09 NOTE — Assessment & Plan Note (Signed)
as an result of a hemorrhagic stroke at age of 78 and subsequent strokes x5 per patient.

## 2021-06-10 DIAGNOSIS — Z9181 History of falling: Secondary | ICD-10-CM | POA: Diagnosis not present

## 2021-06-10 DIAGNOSIS — R293 Abnormal posture: Secondary | ICD-10-CM | POA: Diagnosis not present

## 2021-06-10 DIAGNOSIS — I69328 Other speech and language deficits following cerebral infarction: Secondary | ICD-10-CM | POA: Diagnosis not present

## 2021-06-10 DIAGNOSIS — I69928 Other speech and language deficits following unspecified cerebrovascular disease: Secondary | ICD-10-CM | POA: Diagnosis not present

## 2021-06-10 DIAGNOSIS — M6281 Muscle weakness (generalized): Secondary | ICD-10-CM | POA: Diagnosis not present

## 2021-06-10 DIAGNOSIS — R2689 Other abnormalities of gait and mobility: Secondary | ICD-10-CM | POA: Diagnosis not present

## 2021-06-10 DIAGNOSIS — G8191 Hemiplegia, unspecified affecting right dominant side: Secondary | ICD-10-CM | POA: Diagnosis not present

## 2021-06-11 DIAGNOSIS — R2689 Other abnormalities of gait and mobility: Secondary | ICD-10-CM | POA: Diagnosis not present

## 2021-06-11 DIAGNOSIS — I69328 Other speech and language deficits following cerebral infarction: Secondary | ICD-10-CM | POA: Diagnosis not present

## 2021-06-11 DIAGNOSIS — I69928 Other speech and language deficits following unspecified cerebrovascular disease: Secondary | ICD-10-CM | POA: Diagnosis not present

## 2021-06-11 DIAGNOSIS — Z9181 History of falling: Secondary | ICD-10-CM | POA: Diagnosis not present

## 2021-06-11 DIAGNOSIS — G8191 Hemiplegia, unspecified affecting right dominant side: Secondary | ICD-10-CM | POA: Diagnosis not present

## 2021-06-11 DIAGNOSIS — M6281 Muscle weakness (generalized): Secondary | ICD-10-CM | POA: Diagnosis not present

## 2021-06-12 ENCOUNTER — Encounter: Payer: Self-pay | Admitting: Nurse Practitioner

## 2021-06-12 DIAGNOSIS — R2689 Other abnormalities of gait and mobility: Secondary | ICD-10-CM | POA: Diagnosis not present

## 2021-06-12 DIAGNOSIS — M6281 Muscle weakness (generalized): Secondary | ICD-10-CM | POA: Diagnosis not present

## 2021-06-12 DIAGNOSIS — I69928 Other speech and language deficits following unspecified cerebrovascular disease: Secondary | ICD-10-CM | POA: Diagnosis not present

## 2021-06-12 DIAGNOSIS — Z9181 History of falling: Secondary | ICD-10-CM | POA: Diagnosis not present

## 2021-06-12 DIAGNOSIS — I69328 Other speech and language deficits following cerebral infarction: Secondary | ICD-10-CM | POA: Diagnosis not present

## 2021-06-12 DIAGNOSIS — G8191 Hemiplegia, unspecified affecting right dominant side: Secondary | ICD-10-CM | POA: Diagnosis not present

## 2021-06-15 DIAGNOSIS — I69928 Other speech and language deficits following unspecified cerebrovascular disease: Secondary | ICD-10-CM | POA: Diagnosis not present

## 2021-06-15 DIAGNOSIS — M6281 Muscle weakness (generalized): Secondary | ICD-10-CM | POA: Diagnosis not present

## 2021-06-15 DIAGNOSIS — I69328 Other speech and language deficits following cerebral infarction: Secondary | ICD-10-CM | POA: Diagnosis not present

## 2021-06-15 DIAGNOSIS — Z9181 History of falling: Secondary | ICD-10-CM | POA: Diagnosis not present

## 2021-06-15 DIAGNOSIS — R2689 Other abnormalities of gait and mobility: Secondary | ICD-10-CM | POA: Diagnosis not present

## 2021-06-15 DIAGNOSIS — G8191 Hemiplegia, unspecified affecting right dominant side: Secondary | ICD-10-CM | POA: Diagnosis not present

## 2021-06-16 DIAGNOSIS — M6281 Muscle weakness (generalized): Secondary | ICD-10-CM | POA: Diagnosis not present

## 2021-06-16 DIAGNOSIS — G8191 Hemiplegia, unspecified affecting right dominant side: Secondary | ICD-10-CM | POA: Diagnosis not present

## 2021-06-16 DIAGNOSIS — I69928 Other speech and language deficits following unspecified cerebrovascular disease: Secondary | ICD-10-CM | POA: Diagnosis not present

## 2021-06-16 DIAGNOSIS — I69328 Other speech and language deficits following cerebral infarction: Secondary | ICD-10-CM | POA: Diagnosis not present

## 2021-06-16 DIAGNOSIS — R2689 Other abnormalities of gait and mobility: Secondary | ICD-10-CM | POA: Diagnosis not present

## 2021-06-16 DIAGNOSIS — Z9181 History of falling: Secondary | ICD-10-CM | POA: Diagnosis not present

## 2021-06-16 NOTE — Progress Notes (Signed)
Cardiology Office Note:    Date:  06/17/2021   ID:  Tonya Thompson, DOB 03-Nov-1955, MRN [id number]  PCP:  Unk Pinto, MD   [facility] Providers Cardiologist:  Werner Lean, MD     Referring MD: Martyn Ehrich, NP   CC: calcifications Consulted for the evaluation of aortic atherosclerosis at the behest of Unk Pinto, MD  History of Present Illness:    Tonya Thompson is a 66 y.o. 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 HLD aortic atherosclerosis, CAC who presents for 06/17/21.  Patient notes that she is feeling ok.   Has had no chest pain, chest pressure, chest tightness, chest stinging .   Has a significant sequelae from a prior stroke, Has recent biopsy that looks well per report. No shortness of breath, DOE .  No PND or orthopnea.  Notes  no palpitations or funny heart beats.     She is trying not to eat meat. She is trying to eat vegetables. Does not eat sweets. Her father is in hospice and she is trying for her.    Past Medical History:  Diagnosis Date   Acute kidney injury ([place]) 09/03/2019   05-2019 kidney stone removed   Allergy    seasonal allergies   Arthritis    LEFT hand   AVM (arteriovenous malformation) brain    Cataract    bilateral-not a surgical candidate at this time (07/23/2020)   COVID-19 virus infection 08/2020   GERD (gastroesophageal reflux disease)    on meds   Hyperlipidemia    diet controlled   Insulin resistance 02/16/2018   Seizures ([place])    Grand Mal & Partial complex seizure disorder-last 1998   Stroke [facility])    5 total so far (age [phone])   Ureteral stone 05/10/2019   has multiple kidney stones noted from Urologist   Uses roller walker     Past Surgical History:  Procedure Laterality Date   BRAIN SURGERY  09/19/1978   at [facility], Dr. Leida Lauth   BRONCHIAL BIOPSY  04/21/2021   Procedure: BRONCHIAL  BIOPSIES;  Surgeon: Garner Nash, DO;  Location: [place] [facility];  Service: Pulmonary;;   BRONCHIAL BRUSHINGS  04/21/2021   Procedure: BRONCHIAL BRUSHINGS;  Surgeon: Garner Nash, DO;  Location: [place] [facility];  Service: Pulmonary;;   BRONCHIAL WASHINGS  04/21/2021   Procedure: BRONCHIAL WASHINGS;  Surgeon: Garner Nash, DO;  Location: [place];  Service: Pulmonary;;   COLONOSCOPY  2017   [place], [place]   CYSTOSCOPY/URETEROSCOPY/HOLMIUM LASER/STENT PLACEMENT Left 05/10/2019   Procedure: CYSTOSCOPY/RETROGRADE/URETEROSCOPY/HOLMIUM LASER/STENT PLACEMENT;  Surgeon: Raynelle Bring, MD;  [facility];  Service: Urology;  Laterality: Left;   FIDUCIAL MARKER PLACEMENT  04/21/2021   Procedure: FIDUCIAL MARKER PLACEMENT;  Surgeon: Garner Nash, DO;  Location: [place] [facility];  Service: Pulmonary;;   FOOT SURGERY Right 10/1985   ankle fusion, at [facility], Dr. Lorelee Cover   POLYPECTOMY  2017   multiple adenomas   [place]  04/21/2021   Procedure: RADIAL ENDOBRONCHIAL ULTRASOUND;  Surgeon: Garner Nash, DO;  [facility];  Service: Pulmonary;;   WISDOM TOOTH EXTRACTION      Current Medications: Current Meds  Medication Sig   acetaminophen (TYLENOL) 325 MG tablet Take 650 mg by mouth every 4 (four) hours as needed for moderate pain.   baclofen (LIORESAL) 10 MG  tablet Take 1 tablet up to four times daily as needed   Calcium Carbonate 500 MG CHEW Chew 500 mg by mouth daily.   Calcium Citrate-Vitamin D 315-5 MG-MCG TABS Take 1 tablet by mouth daily at 6 (six) AM.   divalproex (DEPAKOTE ER) 500 MG 24 hr tablet Take 2 tablets every night   fexofenadine (ALLEGRA) 180 MG tablet Take 180 mg by mouth daily.   guaiFENesin (MUCINEX) 600 MG 12 hr tablet Take 600 mg by mouth 2 (two) times daily as needed for cough.   Multiple Vitamins-Minerals (THEREMS-M) TABS Take 1 tablet by mouth daily.   nystatin (MYCOSTATIN/NYSTOP) powder  Apply 1 application topically 2 (two) times daily as needed.   Omega-3 Fatty Acids (FISH OIL PO) Take 1 capsule by mouth daily.   Propylene Glycol (SYSTANE BALANCE) 0.6 % SOLN Place 1 drop into both eyes 2 (two) times daily.   topiramate (TOPAMAX) 200 MG tablet Take 300 mg by mouth 2 (two) times daily.   Vitamin D3 (VITAMIN D) 25 MCG tablet Take 1,000 Units by mouth daily.   zinc oxide 20 % ointment Apply 1 application topically as needed for irritation.     Allergies:   Aspirin, Cheese, Chocolate, Coffee flavor, Other, and Red wine complex [germanium]   Social History   Socioeconomic History   Marital status: Single    Spouse name: Not on file   Number of children: Not on file   Years of education: Not on file   Highest education level: Not on file  Occupational History   Not on file  Tobacco Use   Smoking status: Never    Passive exposure: Past   Smokeless tobacco: Never  Vaping Use   Vaping Use: Never used  Substance and Sexual Activity   Alcohol use: No   Drug use: No   Sexual activity: Not Currently    Birth control/protection: None    Comment: 1st intercourse 66 yo-Fewer than 5 partners  Other Topics Concern   Not on file  Social History Narrative   Right handed until 3rd grade    Left handed   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    Social Determinants of Health   Financial Resource Strain: Not on file  Food Insecurity: Not on file  Transportation Needs: Not on file  Physical Activity: Not on file  Stress: Not on file  Social Connections: Not on file    Social: Father was a Scientist, forensic  Family History: The patient's family history includes CAD in her maternal uncle; COPD in her maternal uncle; Colon polyps in her father; Dementia in her father and paternal uncle; Diabetes in her father; Heart attack in her maternal grandfather and mother; Heart disease in her mother; Heart failure in her mother; Hyperlipidemia in her father and paternal uncle;  Hypertension in her paternal uncle; Melanoma in her father; Prostate cancer in her father; Stroke in her paternal grandmother. There is no history of Breast cancer, Colon cancer, Esophageal cancer, Stomach cancer, or Rectal cancer.  ROS:   Please see the history of present illness.     All other systems reviewed and are negative.  EKGs/Labs/Other Studies Reviewed:    The following studies were reviewed today:  EKG:  EKG is  ordered today.  The ekg ordered today demonstrates  06/17/21: SR rate 78 and low voltage EKG  CT Chest non-con: Date: 04/15/21 Results: Aortic atherosclerosis LAD and LCX CAC   Recent Labs: 04/03/2021: ALT 31; BUN 33; Creat 1.42;  Hemoglobin 14.7; Magnesium 2.2; Platelets 158; Potassium 3.6; Sodium 143; TSH 4.77  Recent Lipid Panel    Component Value Date/Time   CHOL 184 04/03/2021 1003   TRIG 126 04/03/2021 1003   HDL 60 04/03/2021 1003   CHOLHDL 3.1 04/03/2021 1003   VLDL 69 (H) 11/15/2016 1455   LDLCALC 101 (H) 04/03/2021 1003        Physical Exam:    VS:  BP 113/76    Pulse 76    Ht 5\' 5"  (1.651 m)    Wt 67 kg    SpO2 97%    BMI 24.56 kg/m     Wt Readings from Last 3 Encounters:  06/17/21 67 kg  05/22/21 67.3 kg  05/11/21 67.3 kg    Gen: no distress, appears older than state age   Neck: No JVD,  Cardiac: No Rubs or Gallops, No Murmur, RRR +2 radial pulses Respiratory: Clear to auscultation bilaterally, normal effort, normal  respiratory rate GI: Soft, nontender, non-distended  MS: Non pitting  edema L leg,  Integument: Skin feels warm Neuro:  At time of evaluation, alert and oriented to person/place/time/situation Patient frequently pauses when attempting to answer questions Right arm and leg weakness Psych: Normal affect, patient feels   ASSESSMENT:    1. Coronary artery calcification   2. History of CVA (cerebrovascular accident)   3. Impaired cognition   4. Stage 3a chronic kidney disease ([place])   5. Hyperlipidemia, mixed    PLAN:     Prior Stroke and AVMs HTN and HlD CAC Aortic atherosclerosis - presenting without an anginal prodrome - reviewed CT scan with patient - will reach out to Drs. Melford Aase and Delice Lesch (PCP and neurology)- if she is truly not a candidate for future anti-platelet or anticoagulation therapy, we would be extremely limited in cardiac management if she were to need care for CAD - per patient request, will also send copy of report to her epileptologist Dr. Rosine Abe [phone]) - LDL goal < 55, lipids and full LFTs  One year me or PA           Medication Adjustments/Labs and Tests Ordered: Current medicines are reviewed at length with the patient today.  Concerns regarding medicines are outlined above.  Orders Placed This Encounter  Procedures   Lipid panel   Hepatic function panel   EKG 12-Lead   No orders of the defined types were placed in this encounter.   Patient Instructions  Medication Instructions:  Your physician recommends that you continue on your current medications as directed. Please refer to the Current Medication list given to you today.  *If you need a refill on your cardiac medications before your next appointment, please call your pharmacy*   Lab Work: TODAY: Lipid panel and Liver function test If you have labs (blood work) drawn today and your tests are completely normal, you will receive your results only by: [place] (if you have MyChart) OR A paper copy in the mail If you have any lab test that is abnormal or we need to change your treatment, we will call you to review the results.   Testing/Procedures: NONE   Follow-Up: At [facility], you and your health needs are our priority.  As part of our continuing mission to provide you with exceptional heart care, we have created designated Provider Care Teams.  These Care Teams include your primary Cardiologist (physician) and Advanced Practice Providers (APPs -  Physician Assistants and  Nurse Practitioners) who all  work together to provide you with the care you need, when you need it.    Your next appointment:   1 year(s)  The format for your next appointment:   In Person  Provider:   Werner Lean, MD       Signed, Werner Lean, MD  06/17/2021 11:24 AM    [place]

## 2021-06-17 ENCOUNTER — Other Ambulatory Visit: Payer: Self-pay

## 2021-06-17 ENCOUNTER — Encounter: Payer: Self-pay | Admitting: Internal Medicine

## 2021-06-17 ENCOUNTER — Ambulatory Visit (INDEPENDENT_AMBULATORY_CARE_PROVIDER_SITE_OTHER): Payer: Medicare Other | Admitting: Internal Medicine

## 2021-06-17 VITALS — BP 113/76 | HR 76 | Ht 65.0 in | Wt 147.6 lb

## 2021-06-17 DIAGNOSIS — I2510 Atherosclerotic heart disease of native coronary artery without angina pectoris: Secondary | ICD-10-CM

## 2021-06-17 DIAGNOSIS — I2584 Coronary atherosclerosis due to calcified coronary lesion: Secondary | ICD-10-CM | POA: Diagnosis not present

## 2021-06-17 DIAGNOSIS — Z8673 Personal history of transient ischemic attack (TIA), and cerebral infarction without residual deficits: Secondary | ICD-10-CM | POA: Diagnosis not present

## 2021-06-17 DIAGNOSIS — N1831 Chronic kidney disease, stage 3a: Secondary | ICD-10-CM | POA: Diagnosis not present

## 2021-06-17 DIAGNOSIS — R4189 Other symptoms and signs involving cognitive functions and awareness: Secondary | ICD-10-CM | POA: Diagnosis not present

## 2021-06-17 DIAGNOSIS — I69928 Other speech and language deficits following unspecified cerebrovascular disease: Secondary | ICD-10-CM | POA: Diagnosis not present

## 2021-06-17 DIAGNOSIS — I69328 Other speech and language deficits following cerebral infarction: Secondary | ICD-10-CM | POA: Diagnosis not present

## 2021-06-17 DIAGNOSIS — E782 Mixed hyperlipidemia: Secondary | ICD-10-CM | POA: Diagnosis not present

## 2021-06-17 DIAGNOSIS — G8191 Hemiplegia, unspecified affecting right dominant side: Secondary | ICD-10-CM | POA: Diagnosis not present

## 2021-06-17 DIAGNOSIS — R2689 Other abnormalities of gait and mobility: Secondary | ICD-10-CM | POA: Diagnosis not present

## 2021-06-17 DIAGNOSIS — M6281 Muscle weakness (generalized): Secondary | ICD-10-CM | POA: Diagnosis not present

## 2021-06-17 DIAGNOSIS — Z9181 History of falling: Secondary | ICD-10-CM | POA: Diagnosis not present

## 2021-06-17 LAB — HEPATIC FUNCTION PANEL
ALT: 22 IU/L (ref 0–32)
AST: 37 IU/L (ref 0–40)
Albumin: 3.4 g/dL — ABNORMAL LOW (ref 3.8–4.8)
Alkaline Phosphatase: 88 IU/L (ref 44–121)
Bilirubin Total: 0.2 mg/dL (ref 0.0–1.2)
Bilirubin, Direct: <0.1 mg/dL (ref 0.00–0.40)
Total Protein: 5.7 g/dL — ABNORMAL LOW (ref 6.0–8.5)

## 2021-06-17 LAB — LIPID PANEL
Chol/HDL Ratio: 2.5 ratio (ref 0.0–4.4)
Cholesterol, Total: 158 mg/dL (ref 100–199)
HDL: 63 mg/dL (ref >39)
LDL Chol Calc (NIH): 80 mg/dL (ref 0–99)
Triglycerides: 77 mg/dL (ref 0–149)
VLDL Cholesterol Cal: 15 mg/dL (ref 5–40)

## 2021-06-17 NOTE — Patient Instructions (Signed)
Medication Instructions:  Your physician recommends that you continue on your current medications as directed. Please refer to the Current Medication list given to you today.  *If you need a refill on your cardiac medications before your next appointment, please call your pharmacy*   Lab Work: TODAY: Lipid panel and Liver function test If you have labs (blood work) drawn today and your tests are completely normal, you will receive your results only by: [place] (if you have MyChart) OR A paper copy in the mail If you have any lab test that is abnormal or we need to change your treatment, we will call you to review the results.   Testing/Procedures: NONE   Follow-Up: At [facility], you and your health needs are our priority.  As part of our continuing mission to provide you with exceptional heart care, we have created designated Provider Care Teams.  These Care Teams include your primary Cardiologist (physician) and Advanced Practice Providers (APPs -  Physician Assistants and Nurse Practitioners) who all work together to provide you with the care you need, when you need it.    Your next appointment:   1 year(s)  The format for your next appointment:   In Person  Provider:   Werner Lean, MD

## 2021-06-18 DIAGNOSIS — I69928 Other speech and language deficits following unspecified cerebrovascular disease: Secondary | ICD-10-CM | POA: Diagnosis not present

## 2021-06-18 DIAGNOSIS — I69328 Other speech and language deficits following cerebral infarction: Secondary | ICD-10-CM | POA: Diagnosis not present

## 2021-06-18 DIAGNOSIS — Z9181 History of falling: Secondary | ICD-10-CM | POA: Diagnosis not present

## 2021-06-18 DIAGNOSIS — R2689 Other abnormalities of gait and mobility: Secondary | ICD-10-CM | POA: Diagnosis not present

## 2021-06-18 DIAGNOSIS — M6281 Muscle weakness (generalized): Secondary | ICD-10-CM | POA: Diagnosis not present

## 2021-06-18 DIAGNOSIS — G8191 Hemiplegia, unspecified affecting right dominant side: Secondary | ICD-10-CM | POA: Diagnosis not present

## 2021-06-19 ENCOUNTER — Telehealth: Payer: Self-pay

## 2021-06-19 DIAGNOSIS — I69328 Other speech and language deficits following cerebral infarction: Secondary | ICD-10-CM | POA: Diagnosis not present

## 2021-06-19 DIAGNOSIS — R2689 Other abnormalities of gait and mobility: Secondary | ICD-10-CM | POA: Diagnosis not present

## 2021-06-19 DIAGNOSIS — Z9181 History of falling: Secondary | ICD-10-CM | POA: Diagnosis not present

## 2021-06-19 DIAGNOSIS — G8191 Hemiplegia, unspecified affecting right dominant side: Secondary | ICD-10-CM | POA: Diagnosis not present

## 2021-06-19 DIAGNOSIS — I69928 Other speech and language deficits following unspecified cerebrovascular disease: Secondary | ICD-10-CM | POA: Diagnosis not present

## 2021-06-19 DIAGNOSIS — M6281 Muscle weakness (generalized): Secondary | ICD-10-CM | POA: Diagnosis not present

## 2021-06-19 NOTE — Telephone Encounter (Signed)
Attempted to call patient to review, but no answer. Will forward to primary nurse to complete.

## 2021-06-22 DIAGNOSIS — M6281 Muscle weakness (generalized): Secondary | ICD-10-CM | POA: Diagnosis not present

## 2021-06-22 DIAGNOSIS — I69928 Other speech and language deficits following unspecified cerebrovascular disease: Secondary | ICD-10-CM | POA: Diagnosis not present

## 2021-06-22 DIAGNOSIS — G8191 Hemiplegia, unspecified affecting right dominant side: Secondary | ICD-10-CM | POA: Diagnosis not present

## 2021-06-22 DIAGNOSIS — I69328 Other speech and language deficits following cerebral infarction: Secondary | ICD-10-CM | POA: Diagnosis not present

## 2021-06-22 DIAGNOSIS — Z9181 History of falling: Secondary | ICD-10-CM | POA: Diagnosis not present

## 2021-06-22 DIAGNOSIS — G40409 Other generalized epilepsy and epileptic syndromes, not intractable, without status epilepticus: Secondary | ICD-10-CM | POA: Diagnosis not present

## 2021-06-22 DIAGNOSIS — R2689 Other abnormalities of gait and mobility: Secondary | ICD-10-CM | POA: Diagnosis not present

## 2021-06-23 DIAGNOSIS — I69928 Other speech and language deficits following unspecified cerebrovascular disease: Secondary | ICD-10-CM | POA: Diagnosis not present

## 2021-06-23 DIAGNOSIS — R2689 Other abnormalities of gait and mobility: Secondary | ICD-10-CM | POA: Diagnosis not present

## 2021-06-23 DIAGNOSIS — M6281 Muscle weakness (generalized): Secondary | ICD-10-CM | POA: Diagnosis not present

## 2021-06-23 DIAGNOSIS — I69328 Other speech and language deficits following cerebral infarction: Secondary | ICD-10-CM | POA: Diagnosis not present

## 2021-06-23 DIAGNOSIS — Z9181 History of falling: Secondary | ICD-10-CM | POA: Diagnosis not present

## 2021-06-23 DIAGNOSIS — G8191 Hemiplegia, unspecified affecting right dominant side: Secondary | ICD-10-CM | POA: Diagnosis not present

## 2021-06-24 DIAGNOSIS — I69928 Other speech and language deficits following unspecified cerebrovascular disease: Secondary | ICD-10-CM | POA: Diagnosis not present

## 2021-06-24 DIAGNOSIS — I69328 Other speech and language deficits following cerebral infarction: Secondary | ICD-10-CM | POA: Diagnosis not present

## 2021-06-24 DIAGNOSIS — Z9181 History of falling: Secondary | ICD-10-CM | POA: Diagnosis not present

## 2021-06-24 DIAGNOSIS — G8191 Hemiplegia, unspecified affecting right dominant side: Secondary | ICD-10-CM | POA: Diagnosis not present

## 2021-06-24 DIAGNOSIS — R2689 Other abnormalities of gait and mobility: Secondary | ICD-10-CM | POA: Diagnosis not present

## 2021-06-24 DIAGNOSIS — M6281 Muscle weakness (generalized): Secondary | ICD-10-CM | POA: Diagnosis not present

## 2021-06-25 DIAGNOSIS — R2689 Other abnormalities of gait and mobility: Secondary | ICD-10-CM | POA: Diagnosis not present

## 2021-06-25 DIAGNOSIS — I69328 Other speech and language deficits following cerebral infarction: Secondary | ICD-10-CM | POA: Diagnosis not present

## 2021-06-25 DIAGNOSIS — Z9181 History of falling: Secondary | ICD-10-CM | POA: Diagnosis not present

## 2021-06-25 DIAGNOSIS — G8191 Hemiplegia, unspecified affecting right dominant side: Secondary | ICD-10-CM | POA: Diagnosis not present

## 2021-06-25 DIAGNOSIS — M6281 Muscle weakness (generalized): Secondary | ICD-10-CM | POA: Diagnosis not present

## 2021-06-25 DIAGNOSIS — I69928 Other speech and language deficits following unspecified cerebrovascular disease: Secondary | ICD-10-CM | POA: Diagnosis not present

## 2021-06-26 ENCOUNTER — Ambulatory Visit: Payer: Medicare Other | Admitting: Adult Health

## 2021-06-26 DIAGNOSIS — R2689 Other abnormalities of gait and mobility: Secondary | ICD-10-CM | POA: Diagnosis not present

## 2021-06-26 DIAGNOSIS — G8191 Hemiplegia, unspecified affecting right dominant side: Secondary | ICD-10-CM | POA: Diagnosis not present

## 2021-06-26 DIAGNOSIS — M6281 Muscle weakness (generalized): Secondary | ICD-10-CM | POA: Diagnosis not present

## 2021-06-26 DIAGNOSIS — Z9181 History of falling: Secondary | ICD-10-CM | POA: Diagnosis not present

## 2021-06-26 DIAGNOSIS — I69928 Other speech and language deficits following unspecified cerebrovascular disease: Secondary | ICD-10-CM | POA: Diagnosis not present

## 2021-06-26 DIAGNOSIS — I69328 Other speech and language deficits following cerebral infarction: Secondary | ICD-10-CM | POA: Diagnosis not present

## 2021-06-27 DIAGNOSIS — G8191 Hemiplegia, unspecified affecting right dominant side: Secondary | ICD-10-CM | POA: Diagnosis not present

## 2021-06-27 DIAGNOSIS — I69328 Other speech and language deficits following cerebral infarction: Secondary | ICD-10-CM | POA: Diagnosis not present

## 2021-06-27 DIAGNOSIS — R2689 Other abnormalities of gait and mobility: Secondary | ICD-10-CM | POA: Diagnosis not present

## 2021-06-27 DIAGNOSIS — Z9181 History of falling: Secondary | ICD-10-CM | POA: Diagnosis not present

## 2021-06-27 DIAGNOSIS — I69928 Other speech and language deficits following unspecified cerebrovascular disease: Secondary | ICD-10-CM | POA: Diagnosis not present

## 2021-06-27 DIAGNOSIS — M6281 Muscle weakness (generalized): Secondary | ICD-10-CM | POA: Diagnosis not present

## 2021-06-29 DIAGNOSIS — I69328 Other speech and language deficits following cerebral infarction: Secondary | ICD-10-CM | POA: Diagnosis not present

## 2021-06-29 DIAGNOSIS — I69928 Other speech and language deficits following unspecified cerebrovascular disease: Secondary | ICD-10-CM | POA: Diagnosis not present

## 2021-06-29 DIAGNOSIS — Z9181 History of falling: Secondary | ICD-10-CM | POA: Diagnosis not present

## 2021-06-29 DIAGNOSIS — G8191 Hemiplegia, unspecified affecting right dominant side: Secondary | ICD-10-CM | POA: Diagnosis not present

## 2021-06-29 DIAGNOSIS — R2689 Other abnormalities of gait and mobility: Secondary | ICD-10-CM | POA: Diagnosis not present

## 2021-06-29 DIAGNOSIS — M6281 Muscle weakness (generalized): Secondary | ICD-10-CM | POA: Diagnosis not present

## 2021-06-30 DIAGNOSIS — Z9181 History of falling: Secondary | ICD-10-CM | POA: Diagnosis not present

## 2021-06-30 DIAGNOSIS — I69928 Other speech and language deficits following unspecified cerebrovascular disease: Secondary | ICD-10-CM | POA: Diagnosis not present

## 2021-06-30 DIAGNOSIS — G8191 Hemiplegia, unspecified affecting right dominant side: Secondary | ICD-10-CM | POA: Diagnosis not present

## 2021-06-30 DIAGNOSIS — I69328 Other speech and language deficits following cerebral infarction: Secondary | ICD-10-CM | POA: Diagnosis not present

## 2021-06-30 DIAGNOSIS — M6281 Muscle weakness (generalized): Secondary | ICD-10-CM | POA: Diagnosis not present

## 2021-06-30 DIAGNOSIS — R2689 Other abnormalities of gait and mobility: Secondary | ICD-10-CM | POA: Diagnosis not present

## 2021-07-01 DIAGNOSIS — I69328 Other speech and language deficits following cerebral infarction: Secondary | ICD-10-CM | POA: Diagnosis not present

## 2021-07-01 DIAGNOSIS — G8191 Hemiplegia, unspecified affecting right dominant side: Secondary | ICD-10-CM | POA: Diagnosis not present

## 2021-07-01 DIAGNOSIS — I69928 Other speech and language deficits following unspecified cerebrovascular disease: Secondary | ICD-10-CM | POA: Diagnosis not present

## 2021-07-01 DIAGNOSIS — Z9181 History of falling: Secondary | ICD-10-CM | POA: Diagnosis not present

## 2021-07-01 DIAGNOSIS — R2689 Other abnormalities of gait and mobility: Secondary | ICD-10-CM | POA: Diagnosis not present

## 2021-07-01 DIAGNOSIS — M6281 Muscle weakness (generalized): Secondary | ICD-10-CM | POA: Diagnosis not present

## 2021-07-01 DIAGNOSIS — R293 Abnormal posture: Secondary | ICD-10-CM | POA: Diagnosis not present

## 2021-07-02 DIAGNOSIS — Z9181 History of falling: Secondary | ICD-10-CM | POA: Diagnosis not present

## 2021-07-02 DIAGNOSIS — R2689 Other abnormalities of gait and mobility: Secondary | ICD-10-CM | POA: Diagnosis not present

## 2021-07-02 DIAGNOSIS — G8191 Hemiplegia, unspecified affecting right dominant side: Secondary | ICD-10-CM | POA: Diagnosis not present

## 2021-07-02 DIAGNOSIS — I69928 Other speech and language deficits following unspecified cerebrovascular disease: Secondary | ICD-10-CM | POA: Diagnosis not present

## 2021-07-02 DIAGNOSIS — M6281 Muscle weakness (generalized): Secondary | ICD-10-CM | POA: Diagnosis not present

## 2021-07-02 DIAGNOSIS — I69328 Other speech and language deficits following cerebral infarction: Secondary | ICD-10-CM | POA: Diagnosis not present

## 2021-07-03 DIAGNOSIS — I69928 Other speech and language deficits following unspecified cerebrovascular disease: Secondary | ICD-10-CM | POA: Diagnosis not present

## 2021-07-03 DIAGNOSIS — D2262 Melanocytic nevi of left upper limb, including shoulder: Secondary | ICD-10-CM | POA: Diagnosis not present

## 2021-07-03 DIAGNOSIS — G8191 Hemiplegia, unspecified affecting right dominant side: Secondary | ICD-10-CM | POA: Diagnosis not present

## 2021-07-03 DIAGNOSIS — D2272 Melanocytic nevi of left lower limb, including hip: Secondary | ICD-10-CM | POA: Diagnosis not present

## 2021-07-03 DIAGNOSIS — R2689 Other abnormalities of gait and mobility: Secondary | ICD-10-CM | POA: Diagnosis not present

## 2021-07-03 DIAGNOSIS — D2239 Melanocytic nevi of other parts of face: Secondary | ICD-10-CM | POA: Diagnosis not present

## 2021-07-03 DIAGNOSIS — Z9181 History of falling: Secondary | ICD-10-CM | POA: Diagnosis not present

## 2021-07-03 DIAGNOSIS — D224 Melanocytic nevi of scalp and neck: Secondary | ICD-10-CM | POA: Diagnosis not present

## 2021-07-03 DIAGNOSIS — I69328 Other speech and language deficits following cerebral infarction: Secondary | ICD-10-CM | POA: Diagnosis not present

## 2021-07-03 DIAGNOSIS — D2271 Melanocytic nevi of right lower limb, including hip: Secondary | ICD-10-CM | POA: Diagnosis not present

## 2021-07-03 DIAGNOSIS — M6281 Muscle weakness (generalized): Secondary | ICD-10-CM | POA: Diagnosis not present

## 2021-07-03 DIAGNOSIS — L821 Other seborrheic keratosis: Secondary | ICD-10-CM | POA: Diagnosis not present

## 2021-07-03 DIAGNOSIS — D225 Melanocytic nevi of trunk: Secondary | ICD-10-CM | POA: Diagnosis not present

## 2021-07-03 DIAGNOSIS — D2261 Melanocytic nevi of right upper limb, including shoulder: Secondary | ICD-10-CM | POA: Diagnosis not present

## 2021-07-06 DIAGNOSIS — R2689 Other abnormalities of gait and mobility: Secondary | ICD-10-CM | POA: Diagnosis not present

## 2021-07-06 DIAGNOSIS — I69928 Other speech and language deficits following unspecified cerebrovascular disease: Secondary | ICD-10-CM | POA: Diagnosis not present

## 2021-07-06 DIAGNOSIS — I69328 Other speech and language deficits following cerebral infarction: Secondary | ICD-10-CM | POA: Diagnosis not present

## 2021-07-06 DIAGNOSIS — G8191 Hemiplegia, unspecified affecting right dominant side: Secondary | ICD-10-CM | POA: Diagnosis not present

## 2021-07-06 DIAGNOSIS — Z9181 History of falling: Secondary | ICD-10-CM | POA: Diagnosis not present

## 2021-07-06 DIAGNOSIS — M6281 Muscle weakness (generalized): Secondary | ICD-10-CM | POA: Diagnosis not present

## 2021-07-07 DIAGNOSIS — I69328 Other speech and language deficits following cerebral infarction: Secondary | ICD-10-CM | POA: Diagnosis not present

## 2021-07-07 DIAGNOSIS — Z9181 History of falling: Secondary | ICD-10-CM | POA: Diagnosis not present

## 2021-07-07 DIAGNOSIS — I69928 Other speech and language deficits following unspecified cerebrovascular disease: Secondary | ICD-10-CM | POA: Diagnosis not present

## 2021-07-07 DIAGNOSIS — G8191 Hemiplegia, unspecified affecting right dominant side: Secondary | ICD-10-CM | POA: Diagnosis not present

## 2021-07-07 DIAGNOSIS — M6281 Muscle weakness (generalized): Secondary | ICD-10-CM | POA: Diagnosis not present

## 2021-07-07 DIAGNOSIS — R2689 Other abnormalities of gait and mobility: Secondary | ICD-10-CM | POA: Diagnosis not present

## 2021-07-08 ENCOUNTER — Emergency Department (HOSPITAL_COMMUNITY): Payer: Medicare Other

## 2021-07-08 ENCOUNTER — Other Ambulatory Visit: Payer: Self-pay

## 2021-07-08 ENCOUNTER — Inpatient Hospital Stay (HOSPITAL_COMMUNITY)
Admission: EM | Admit: 2021-07-08 | Discharge: 2021-07-12 | DRG: 480 | Disposition: A | Discharge status: Skilled Nursing Facility | Payer: Medicare Other | Source: Skilled Nursing Facility | Attending: Internal Medicine | Admitting: Internal Medicine

## 2021-07-08 ENCOUNTER — Encounter (HOSPITAL_COMMUNITY): Payer: Self-pay | Admitting: Oncology

## 2021-07-08 DIAGNOSIS — G9341 Metabolic encephalopathy: Secondary | ICD-10-CM | POA: Diagnosis not present

## 2021-07-08 DIAGNOSIS — E440 Moderate protein-calorie malnutrition: Secondary | ICD-10-CM | POA: Insufficient documentation

## 2021-07-08 DIAGNOSIS — M25561 Pain in right knee: Secondary | ICD-10-CM | POA: Diagnosis present

## 2021-07-08 DIAGNOSIS — Z66 Do not resuscitate: Secondary | ICD-10-CM | POA: Diagnosis present

## 2021-07-08 DIAGNOSIS — Z8371 Family history of colonic polyps: Secondary | ICD-10-CM

## 2021-07-08 DIAGNOSIS — Z20822 Contact with and (suspected) exposure to covid-19: Secondary | ICD-10-CM | POA: Diagnosis not present

## 2021-07-08 DIAGNOSIS — M419 Scoliosis, unspecified: Secondary | ICD-10-CM | POA: Diagnosis present

## 2021-07-08 DIAGNOSIS — E7220 Disorder of urea cycle metabolism, unspecified: Secondary | ICD-10-CM | POA: Diagnosis present

## 2021-07-08 DIAGNOSIS — G40409 Other generalized epilepsy and epileptic syndromes, not intractable, without status epilepticus: Secondary | ICD-10-CM | POA: Diagnosis not present

## 2021-07-08 DIAGNOSIS — M1990 Unspecified osteoarthritis, unspecified site: Secondary | ICD-10-CM | POA: Diagnosis present

## 2021-07-08 DIAGNOSIS — K219 Gastro-esophageal reflux disease without esophagitis: Secondary | ICD-10-CM | POA: Diagnosis present

## 2021-07-08 DIAGNOSIS — N183 Chronic kidney disease, stage 3 unspecified: Secondary | ICD-10-CM | POA: Diagnosis present

## 2021-07-08 DIAGNOSIS — I5030 Unspecified diastolic (congestive) heart failure: Secondary | ICD-10-CM | POA: Diagnosis not present

## 2021-07-08 DIAGNOSIS — N1832 Chronic kidney disease, stage 3b: Secondary | ICD-10-CM | POA: Diagnosis present

## 2021-07-08 DIAGNOSIS — I252 Old myocardial infarction: Secondary | ICD-10-CM

## 2021-07-08 DIAGNOSIS — S72141A Displaced intertrochanteric fracture of right femur, initial encounter for closed fracture: Principal | ICD-10-CM

## 2021-07-08 DIAGNOSIS — D539 Nutritional anemia, unspecified: Secondary | ICD-10-CM | POA: Diagnosis present

## 2021-07-08 DIAGNOSIS — M7989 Other specified soft tissue disorders: Secondary | ICD-10-CM | POA: Diagnosis not present

## 2021-07-08 DIAGNOSIS — R4020 Unspecified coma: Secondary | ICD-10-CM | POA: Diagnosis not present

## 2021-07-08 DIAGNOSIS — R410 Disorientation, unspecified: Secondary | ICD-10-CM | POA: Diagnosis not present

## 2021-07-08 DIAGNOSIS — Z808 Family history of malignant neoplasm of other organs or systems: Secondary | ICD-10-CM

## 2021-07-08 DIAGNOSIS — R2689 Other abnormalities of gait and mobility: Secondary | ICD-10-CM | POA: Diagnosis not present

## 2021-07-08 DIAGNOSIS — B9689 Other specified bacterial agents as the cause of diseases classified elsewhere: Secondary | ICD-10-CM | POA: Diagnosis not present

## 2021-07-08 DIAGNOSIS — D693 Immune thrombocytopenic purpura: Secondary | ICD-10-CM | POA: Diagnosis not present

## 2021-07-08 DIAGNOSIS — R079 Chest pain, unspecified: Secondary | ICD-10-CM | POA: Diagnosis not present

## 2021-07-08 DIAGNOSIS — W010XXA Fall on same level from slipping, tripping and stumbling without subsequent striking against object, initial encounter: Secondary | ICD-10-CM | POA: Diagnosis present

## 2021-07-08 DIAGNOSIS — H269 Unspecified cataract: Secondary | ICD-10-CM | POA: Diagnosis present

## 2021-07-08 DIAGNOSIS — D62 Acute posthemorrhagic anemia: Secondary | ICD-10-CM | POA: Diagnosis present

## 2021-07-08 DIAGNOSIS — Z7983 Long term (current) use of bisphosphonates: Secondary | ICD-10-CM

## 2021-07-08 DIAGNOSIS — Z888 Allergy status to other drugs, medicaments and biological substances status: Secondary | ICD-10-CM

## 2021-07-08 DIAGNOSIS — Z043 Encounter for examination and observation following other accident: Secondary | ICD-10-CM | POA: Diagnosis not present

## 2021-07-08 DIAGNOSIS — Z8616 Personal history of COVID-19: Secondary | ICD-10-CM | POA: Diagnosis not present

## 2021-07-08 DIAGNOSIS — N390 Urinary tract infection, site not specified: Secondary | ICD-10-CM | POA: Diagnosis not present

## 2021-07-08 DIAGNOSIS — Z8249 Family history of ischemic heart disease and other diseases of the circulatory system: Secondary | ICD-10-CM

## 2021-07-08 DIAGNOSIS — I2584 Coronary atherosclerosis due to calcified coronary lesion: Secondary | ICD-10-CM | POA: Diagnosis not present

## 2021-07-08 DIAGNOSIS — R41841 Cognitive communication deficit: Secondary | ICD-10-CM | POA: Diagnosis not present

## 2021-07-08 DIAGNOSIS — Y92009 Unspecified place in unspecified non-institutional (private) residence as the place of occurrence of the external cause: Secondary | ICD-10-CM

## 2021-07-08 DIAGNOSIS — S79929A Unspecified injury of unspecified thigh, initial encounter: Secondary | ICD-10-CM | POA: Diagnosis not present

## 2021-07-08 DIAGNOSIS — R569 Unspecified convulsions: Secondary | ICD-10-CM | POA: Diagnosis not present

## 2021-07-08 DIAGNOSIS — D696 Thrombocytopenia, unspecified: Secondary | ICD-10-CM | POA: Diagnosis not present

## 2021-07-08 DIAGNOSIS — G40909 Epilepsy, unspecified, not intractable, without status epilepticus: Secondary | ICD-10-CM | POA: Diagnosis not present

## 2021-07-08 DIAGNOSIS — S72141D Displaced intertrochanteric fracture of right femur, subsequent encounter for closed fracture with routine healing: Secondary | ICD-10-CM | POA: Diagnosis not present

## 2021-07-08 DIAGNOSIS — Z886 Allergy status to analgesic agent status: Secondary | ICD-10-CM

## 2021-07-08 DIAGNOSIS — R000 Tachycardia, unspecified: Secondary | ICD-10-CM | POA: Diagnosis not present

## 2021-07-08 DIAGNOSIS — D7589 Other specified diseases of blood and blood-forming organs: Secondary | ICD-10-CM | POA: Diagnosis present

## 2021-07-08 DIAGNOSIS — R102 Pelvic and perineal pain: Secondary | ICD-10-CM | POA: Diagnosis not present

## 2021-07-08 DIAGNOSIS — Z419 Encounter for procedure for purposes other than remedying health state, unspecified: Secondary | ICD-10-CM

## 2021-07-08 DIAGNOSIS — B9620 Unspecified Escherichia coli [E. coli] as the cause of diseases classified elsewhere: Secondary | ICD-10-CM | POA: Diagnosis present

## 2021-07-08 DIAGNOSIS — I129 Hypertensive chronic kidney disease with stage 1 through stage 4 chronic kidney disease, or unspecified chronic kidney disease: Secondary | ICD-10-CM | POA: Diagnosis present

## 2021-07-08 DIAGNOSIS — I10 Essential (primary) hypertension: Secondary | ICD-10-CM | POA: Diagnosis not present

## 2021-07-08 DIAGNOSIS — Z833 Family history of diabetes mellitus: Secondary | ICD-10-CM

## 2021-07-08 DIAGNOSIS — R29898 Other symptoms and signs involving the musculoskeletal system: Secondary | ICD-10-CM | POA: Diagnosis not present

## 2021-07-08 DIAGNOSIS — M8580 Other specified disorders of bone density and structure, unspecified site: Secondary | ICD-10-CM | POA: Diagnosis present

## 2021-07-08 DIAGNOSIS — Z981 Arthrodesis status: Secondary | ICD-10-CM

## 2021-07-08 DIAGNOSIS — G9389 Other specified disorders of brain: Secondary | ICD-10-CM | POA: Diagnosis not present

## 2021-07-08 DIAGNOSIS — Z79899 Other long term (current) drug therapy: Secondary | ICD-10-CM | POA: Diagnosis not present

## 2021-07-08 DIAGNOSIS — Z83438 Family history of other disorder of lipoprotein metabolism and other lipidemia: Secondary | ICD-10-CM

## 2021-07-08 DIAGNOSIS — I2510 Atherosclerotic heart disease of native coronary artery without angina pectoris: Secondary | ICD-10-CM | POA: Diagnosis present

## 2021-07-08 DIAGNOSIS — W19XXXA Unspecified fall, initial encounter: Secondary | ICD-10-CM

## 2021-07-08 DIAGNOSIS — I6990 Unspecified sequelae of unspecified cerebrovascular disease: Secondary | ICD-10-CM | POA: Diagnosis not present

## 2021-07-08 DIAGNOSIS — N179 Acute kidney failure, unspecified: Secondary | ICD-10-CM | POA: Diagnosis not present

## 2021-07-08 DIAGNOSIS — G8191 Hemiplegia, unspecified affecting right dominant side: Secondary | ICD-10-CM | POA: Diagnosis not present

## 2021-07-08 DIAGNOSIS — M4126 Other idiopathic scoliosis, lumbar region: Secondary | ICD-10-CM | POA: Diagnosis not present

## 2021-07-08 DIAGNOSIS — S22030A Wedge compression fracture of third thoracic vertebra, initial encounter for closed fracture: Secondary | ICD-10-CM

## 2021-07-08 DIAGNOSIS — E872 Acidosis, unspecified: Secondary | ICD-10-CM | POA: Diagnosis present

## 2021-07-08 DIAGNOSIS — E871 Hypo-osmolality and hyponatremia: Secondary | ICD-10-CM | POA: Diagnosis present

## 2021-07-08 DIAGNOSIS — G40209 Localization-related (focal) (partial) symptomatic epilepsy and epileptic syndromes with complex partial seizures, not intractable, without status epilepticus: Secondary | ICD-10-CM | POA: Diagnosis present

## 2021-07-08 DIAGNOSIS — Z825 Family history of asthma and other chronic lower respiratory diseases: Secondary | ICD-10-CM

## 2021-07-08 DIAGNOSIS — Z8042 Family history of malignant neoplasm of prostate: Secondary | ICD-10-CM

## 2021-07-08 DIAGNOSIS — Z7401 Bed confinement status: Secondary | ICD-10-CM | POA: Diagnosis not present

## 2021-07-08 DIAGNOSIS — E860 Dehydration: Secondary | ICD-10-CM | POA: Diagnosis present

## 2021-07-08 DIAGNOSIS — Z87442 Personal history of urinary calculi: Secondary | ICD-10-CM

## 2021-07-08 DIAGNOSIS — R4182 Altered mental status, unspecified: Secondary | ICD-10-CM | POA: Diagnosis not present

## 2021-07-08 DIAGNOSIS — H9190 Unspecified hearing loss, unspecified ear: Secondary | ICD-10-CM | POA: Diagnosis present

## 2021-07-08 DIAGNOSIS — R7989 Other specified abnormal findings of blood chemistry: Secondary | ICD-10-CM | POA: Diagnosis present

## 2021-07-08 DIAGNOSIS — E782 Mixed hyperlipidemia: Secondary | ICD-10-CM | POA: Diagnosis present

## 2021-07-08 DIAGNOSIS — Z6823 Body mass index (BMI) 23.0-23.9, adult: Secondary | ICD-10-CM

## 2021-07-08 DIAGNOSIS — E441 Mild protein-calorie malnutrition: Secondary | ICD-10-CM | POA: Diagnosis present

## 2021-07-08 DIAGNOSIS — E876 Hypokalemia: Secondary | ICD-10-CM | POA: Diagnosis present

## 2021-07-08 DIAGNOSIS — Z823 Family history of stroke: Secondary | ICD-10-CM

## 2021-07-08 DIAGNOSIS — G40219 Localization-related (focal) (partial) symptomatic epilepsy and epileptic syndromes with complex partial seizures, intractable, without status epilepticus: Secondary | ICD-10-CM | POA: Diagnosis not present

## 2021-07-08 DIAGNOSIS — R001 Bradycardia, unspecified: Secondary | ICD-10-CM | POA: Diagnosis not present

## 2021-07-08 DIAGNOSIS — I69351 Hemiplegia and hemiparesis following cerebral infarction affecting right dominant side: Secondary | ICD-10-CM | POA: Diagnosis not present

## 2021-07-08 DIAGNOSIS — D72829 Elevated white blood cell count, unspecified: Secondary | ICD-10-CM

## 2021-07-08 LAB — HEPATIC FUNCTION PANEL
ALT: 34 U/L (ref 0–44)
AST: 57 U/L — ABNORMAL HIGH (ref 15–41)
Albumin: 3.3 g/dL — ABNORMAL LOW (ref 3.5–5.0)
Alkaline Phosphatase: 80 U/L (ref 38–126)
Bilirubin, Direct: 0.2 mg/dL (ref 0.0–0.2)
Indirect Bilirubin: 0.5 mg/dL (ref 0.3–0.9)
Total Bilirubin: 0.7 mg/dL (ref 0.3–1.2)
Total Protein: 6.5 g/dL (ref 6.5–8.1)

## 2021-07-08 LAB — URINALYSIS, ROUTINE W REFLEX MICROSCOPIC
Bilirubin Urine: NEGATIVE
Glucose, UA: NEGATIVE mg/dL
Ketones, ur: 5 mg/dL — AB
Leukocytes,Ua: NEGATIVE
Nitrite: NEGATIVE
Protein, ur: 100 mg/dL — AB
Specific Gravity, Urine: 1.011 (ref 1.005–1.030)
pH: 7 (ref 5.0–8.0)

## 2021-07-08 LAB — CBC WITH DIFFERENTIAL/PLATELET
Abs Immature Granulocytes: 0.04 10*3/uL (ref 0.00–0.07)
Basophils Absolute: 0 10*3/uL (ref 0.0–0.1)
Basophils Relative: 0 %
Eosinophils Absolute: 0 10*3/uL (ref 0.0–0.5)
Eosinophils Relative: 0 %
HCT: 45.3 % (ref 36.0–46.0)
Hemoglobin: 15.3 g/dL — ABNORMAL HIGH (ref 12.0–15.0)
Immature Granulocytes: 1 %
Lymphocytes Relative: 13 %
Lymphs Abs: 0.8 10*3/uL (ref 0.7–4.0)
MCH: 35.9 pg — ABNORMAL HIGH (ref 26.0–34.0)
MCHC: 33.8 g/dL (ref 30.0–36.0)
MCV: 106.3 fL — ABNORMAL HIGH (ref 80.0–100.0)
Monocytes Absolute: 0.6 10*3/uL (ref 0.1–1.0)
Monocytes Relative: 9 %
Neutro Abs: 5 10*3/uL (ref 1.7–7.7)
Neutrophils Relative %: 77 %
Platelets: 93 10*3/uL — ABNORMAL LOW (ref 150–400)
RBC: 4.26 MIL/uL (ref 3.87–5.11)
RDW: 12.8 % (ref 11.5–15.5)
WBC: 6.5 10*3/uL (ref 4.0–10.5)
nRBC: 0 % (ref 0.0–0.2)

## 2021-07-08 LAB — RESP PANEL BY RT-PCR (FLU A&B, COVID) ARPGX2
Influenza A by PCR: NEGATIVE
Influenza B by PCR: NEGATIVE
SARS Coronavirus 2 by RT PCR: NEGATIVE

## 2021-07-08 LAB — BASIC METABOLIC PANEL
Anion gap: 6 (ref 5–15)
BUN: 26 mg/dL — ABNORMAL HIGH (ref 8–23)
CO2: 27 mmol/L (ref 22–32)
Calcium: 9.4 mg/dL (ref 8.9–10.3)
Chloride: 106 mmol/L (ref 98–111)
Creatinine, Ser: 1.32 mg/dL — ABNORMAL HIGH (ref 0.44–1.00)
GFR, Estimated: 45 mL/min — ABNORMAL LOW (ref >60)
Glucose, Bld: 99 mg/dL (ref 70–99)
Potassium: 3.9 mmol/L (ref 3.5–5.1)
Sodium: 139 mmol/L (ref 135–145)

## 2021-07-08 LAB — MAGNESIUM: Magnesium: 2.4 mg/dL (ref 1.7–2.4)

## 2021-07-08 LAB — SURGICAL PCR SCREEN
MRSA, PCR: NEGATIVE
Staphylococcus aureus: NEGATIVE

## 2021-07-08 LAB — PHOSPHORUS: Phosphorus: 4.2 mg/dL (ref 2.5–4.6)

## 2021-07-08 IMAGING — CR DG PELVIS 1-2V
1 series · 1 of 1 positions shown · non-contrast
Comparison: None

CLINICAL DATA: Fell 3 hours ago, hip pain

EXAM:
PELVIS - 1-2 VIEW

[t pelvis ap]
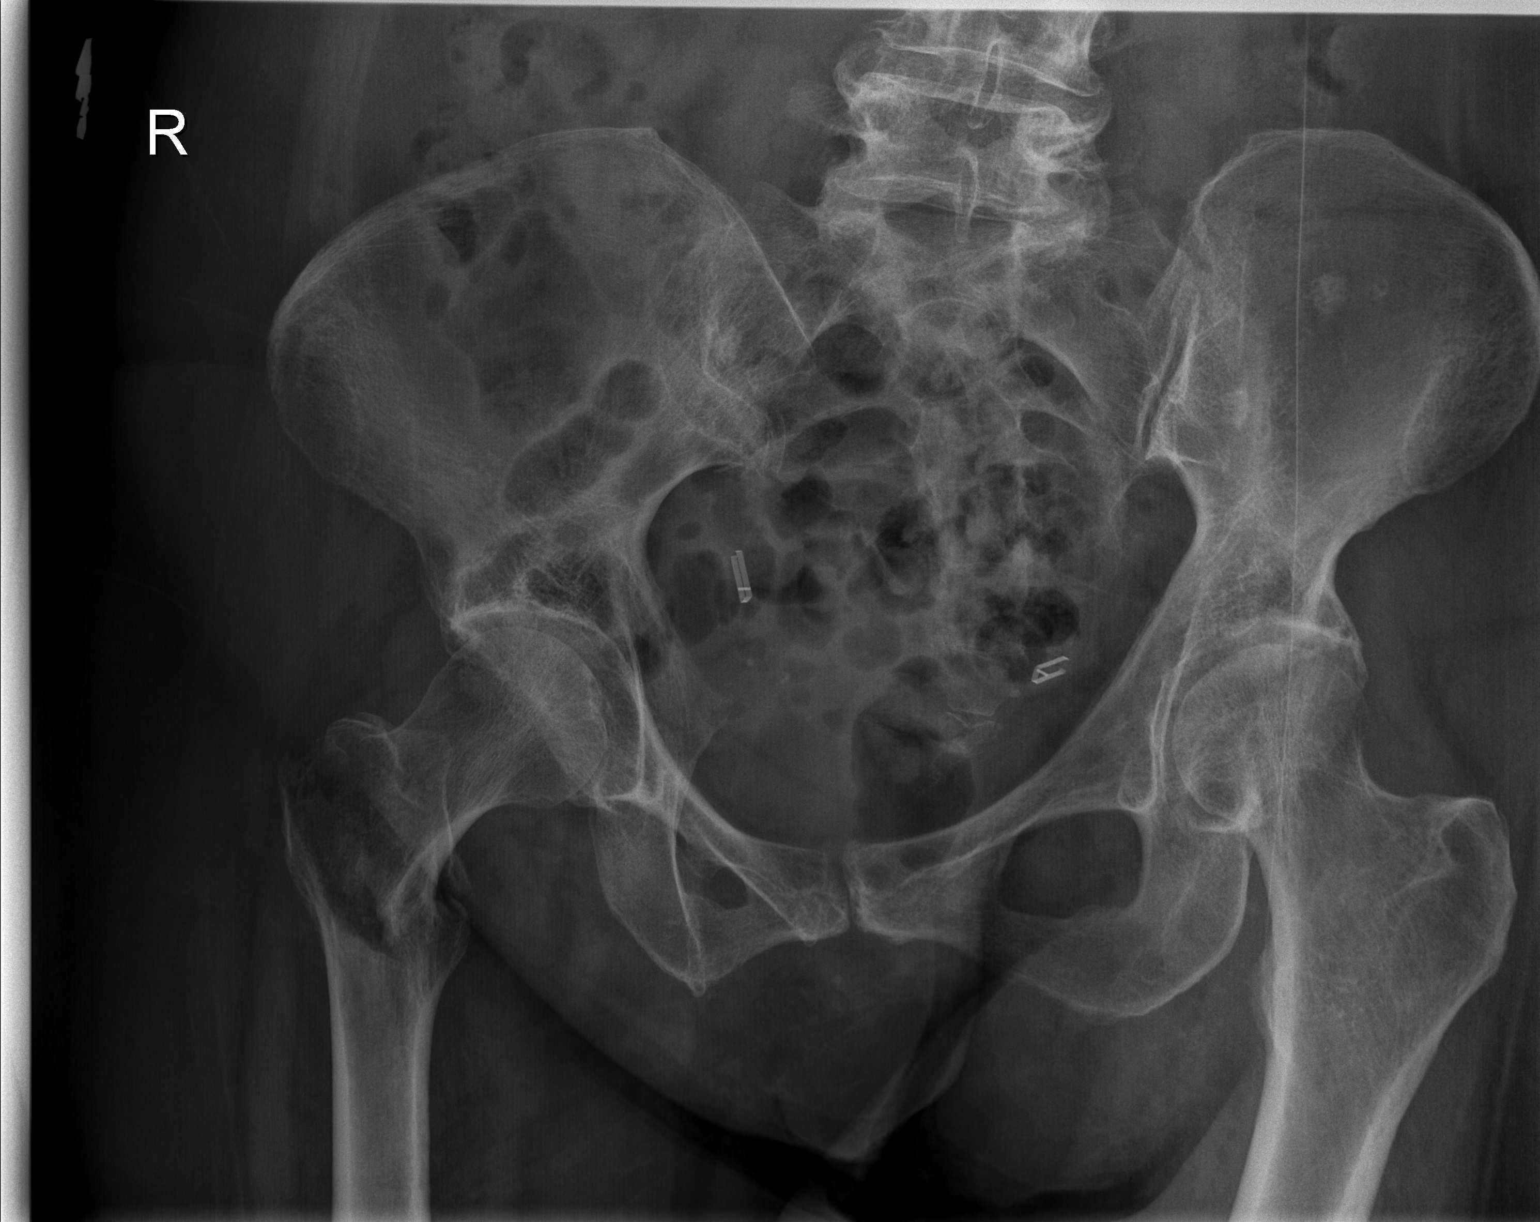

[1 of 1 positions shown; findings below may reference images not displayed]

FINDINGS: Osseous demineralization.

Hip and SI joint spaces preserved.

Mildly distracted and angulated intertrochanteric fracture RIGHT
femur.

No additional fracture, dislocation, or bone destruction.

Tubal ligation clips in pelvis.
IMPRESSION: Mildly displaced and angulated intertrochanteric fracture RIGHT
femur.

## 2021-07-08 IMAGING — CR DG THORACIC SPINE 2V
3 series · 3 of 3 positions shown · non-contrast
Comparison: None; correlation CT chest [DATE]

CLINICAL DATA: Fell 3 hours ago

EXAM:
THORACIC SPINE 2 VIEWS

[t thoracic spine ap]
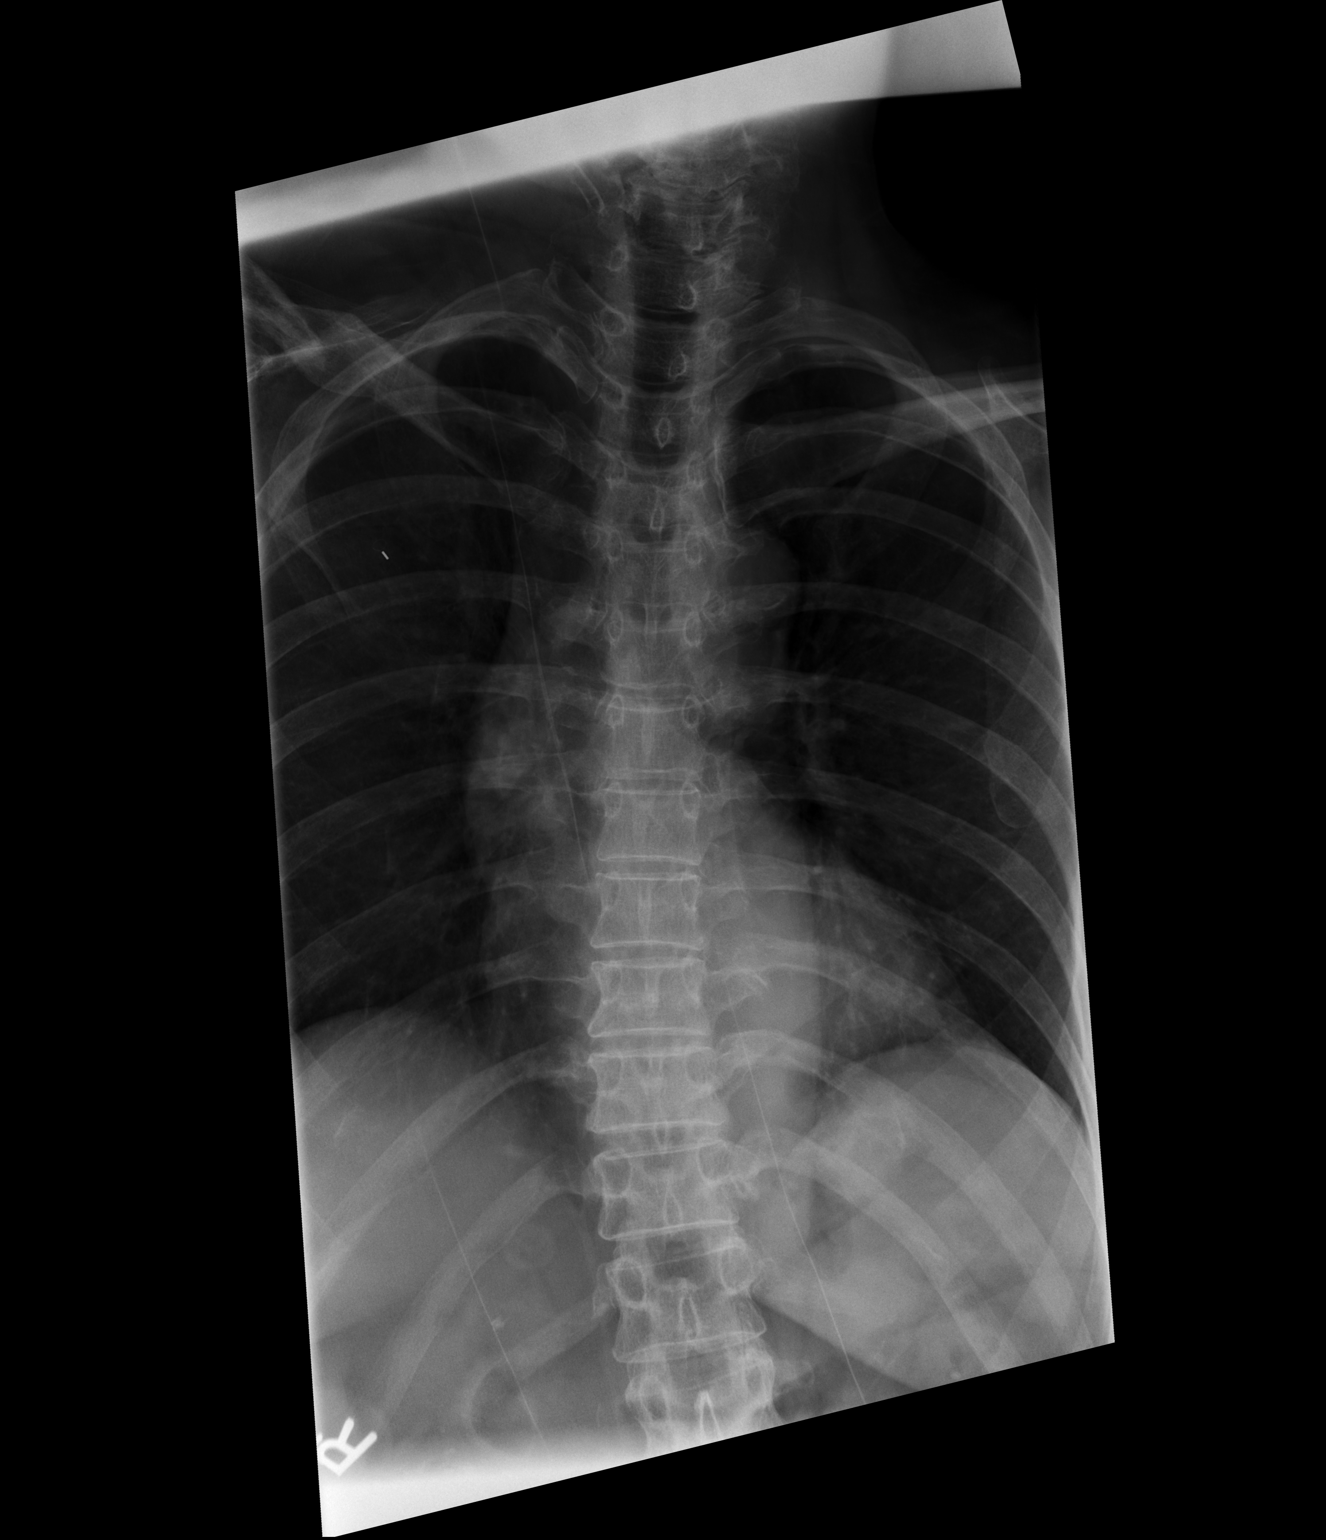

[t thoracic spine lat]
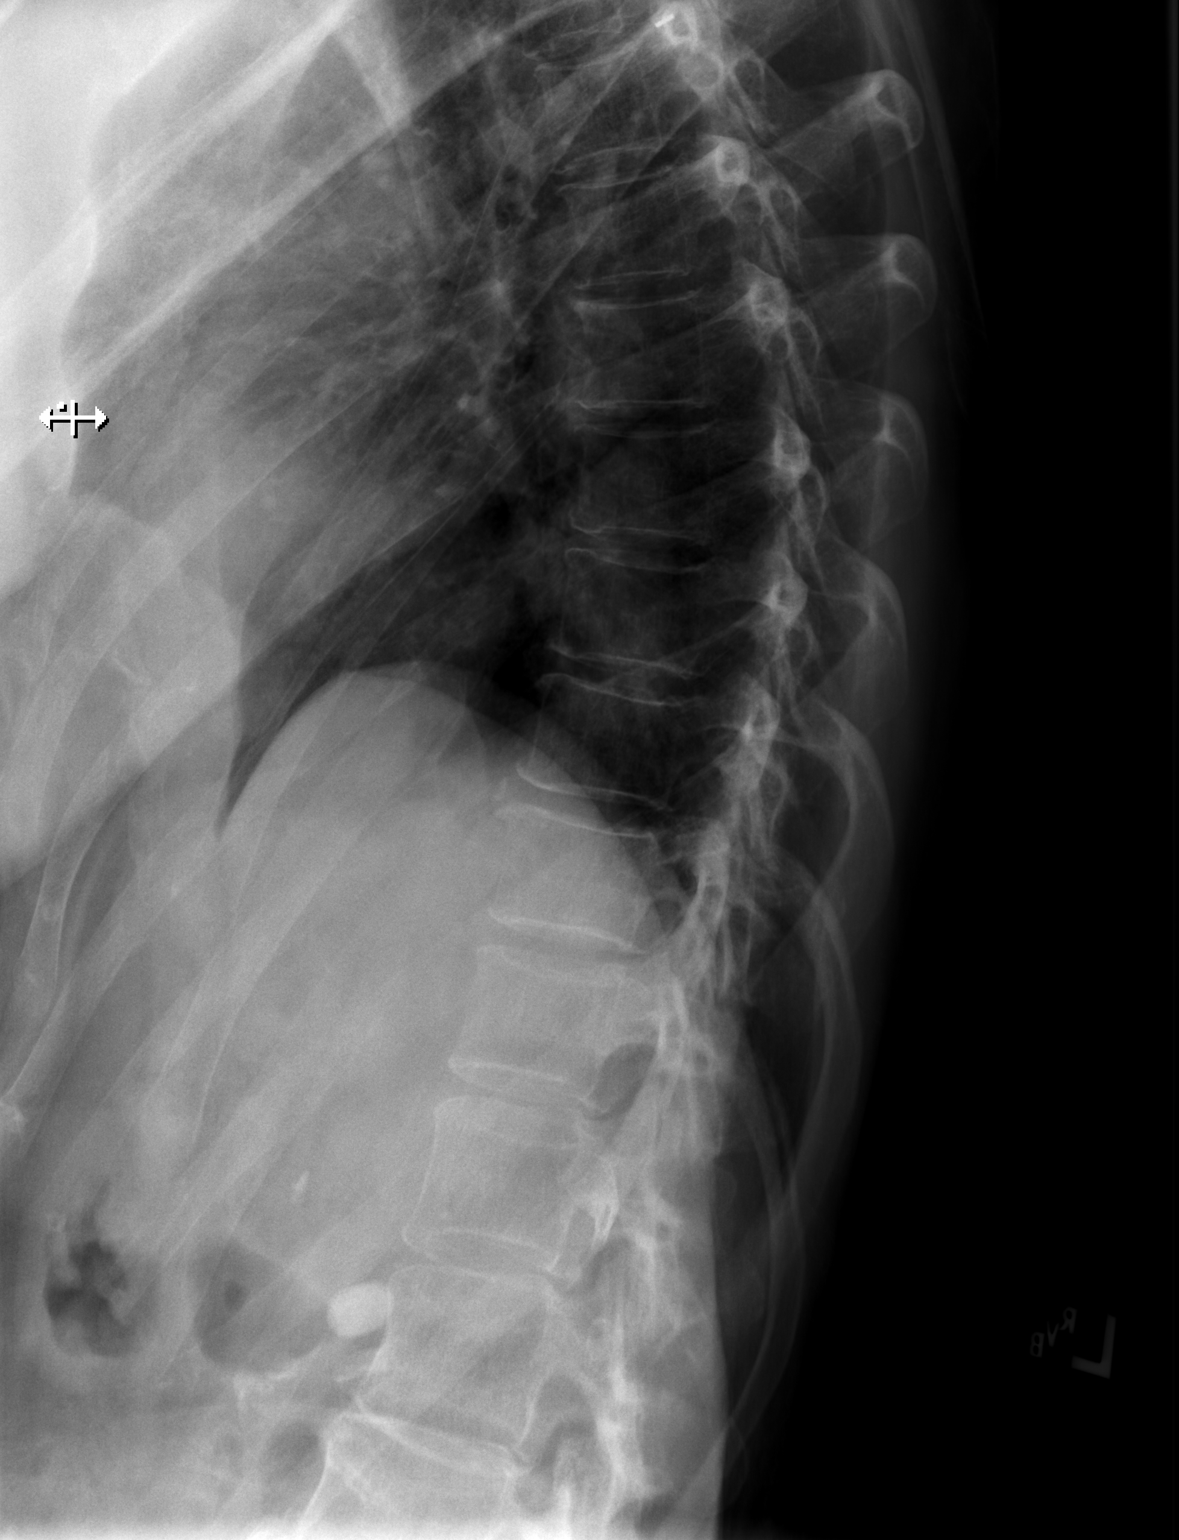

[t thoracic swimmers]
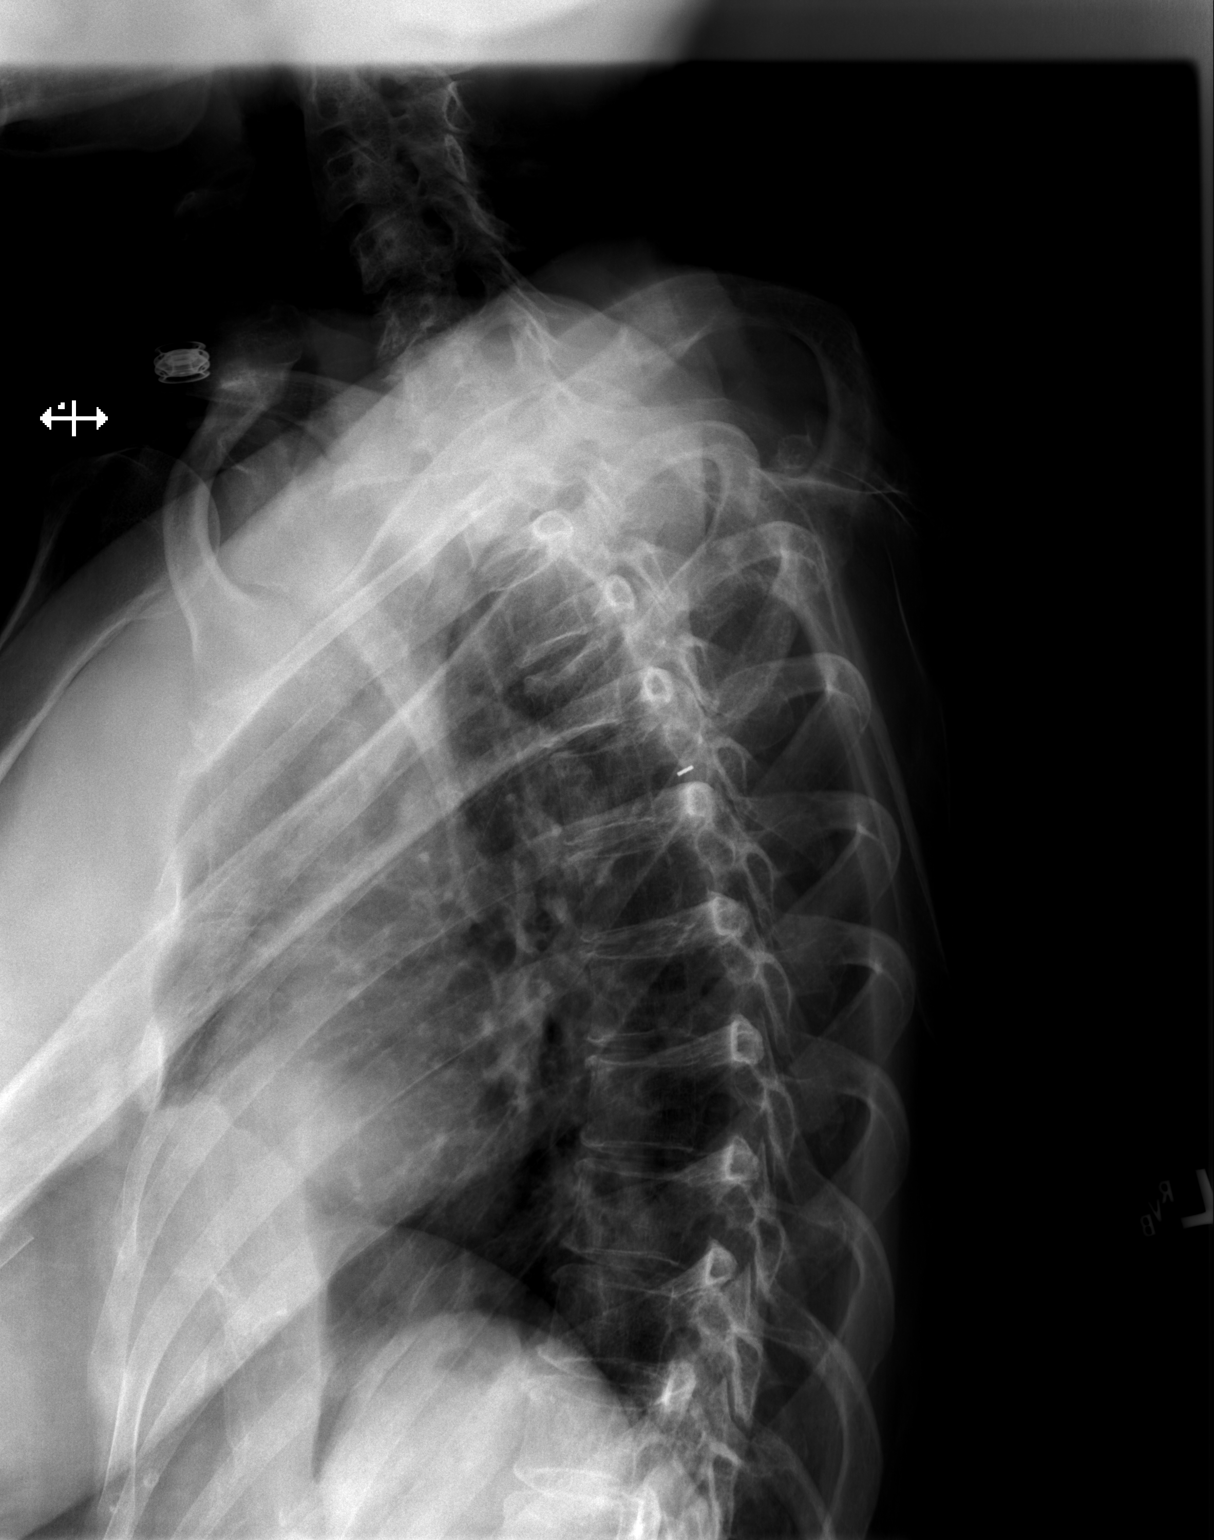

[3 of 3 positions shown; findings below may reference images not displayed]

FINDINGS: Twelve pairs of ribs.

Bones demineralized.

Dextroconvex scoliosis lower thoracic spine.

Mild superior endplate compression deformity of approximately L3
vertebral body new since [DATE]

Vertebral body heights otherwise maintained without additional
fracture, subluxation, or bone destruction.
IMPRESSION: Osseous demineralization with dextroconvex thoracic scoliosis.

New mild superior endplate compression deformity of T3 vertebral
body since [DATE].

## 2021-07-08 IMAGING — CR DG LUMBAR SPINE COMPLETE 4+V
6 series · 6 of 6 positions shown · non-contrast
Comparison: None

CLINICAL DATA: Fell 3 hours ago

EXAM:
LUMBAR SPINE - COMPLETE 4+ VIEW

[t lumbar spine ap]
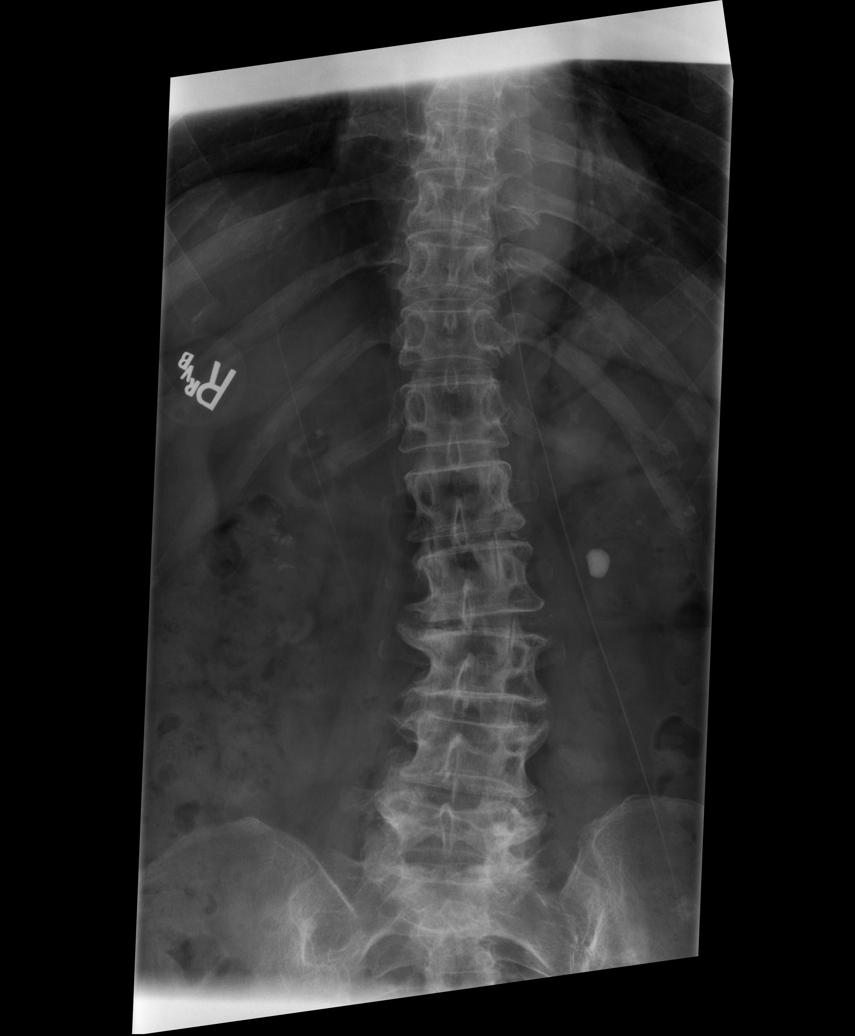

[t lumbar spine obl (1 of 3)]
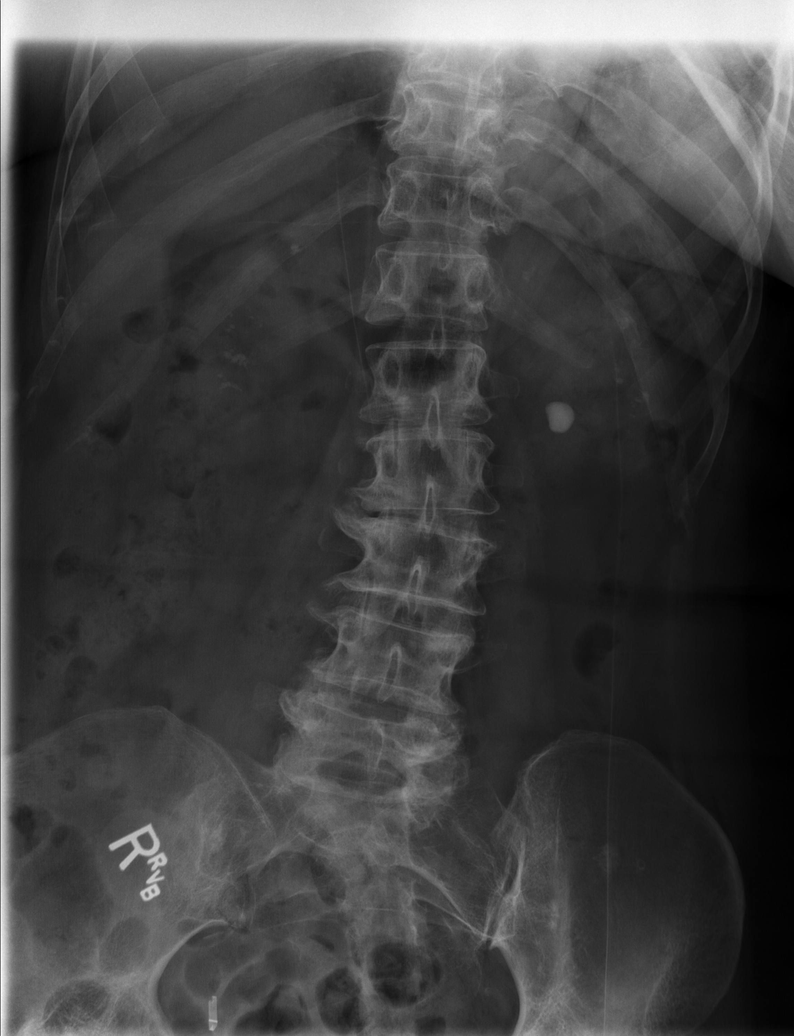

[t lumbar spine obl (2 of 3)]
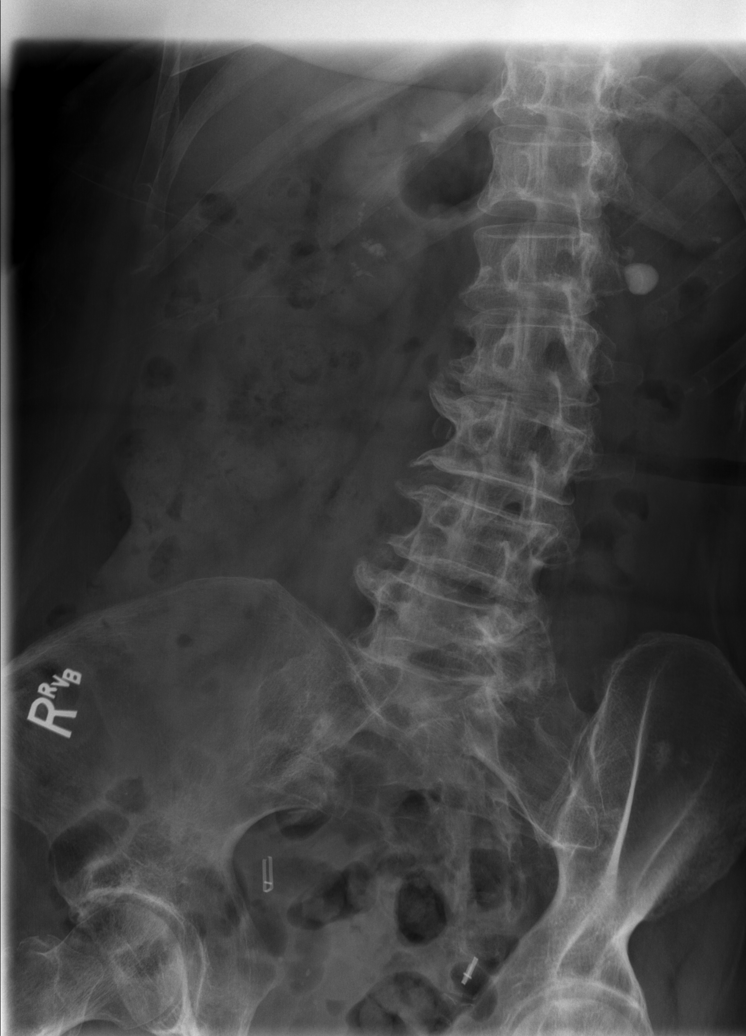

[t lumbar spine obl (3 of 3)]
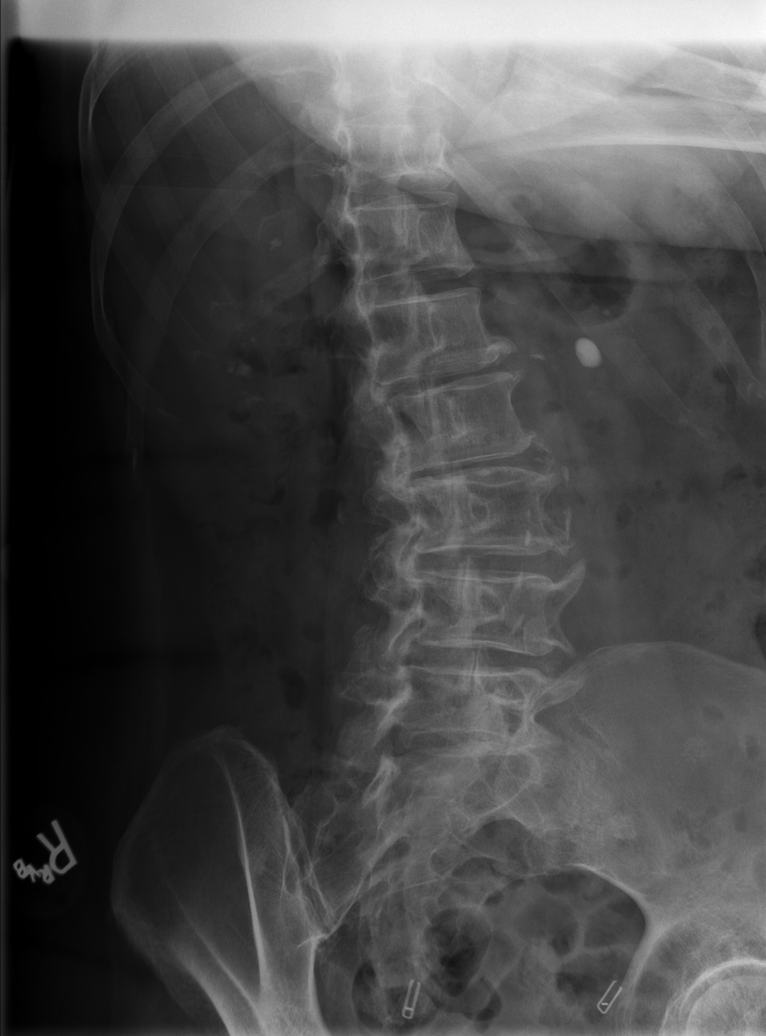

[t lumbar spine lat]
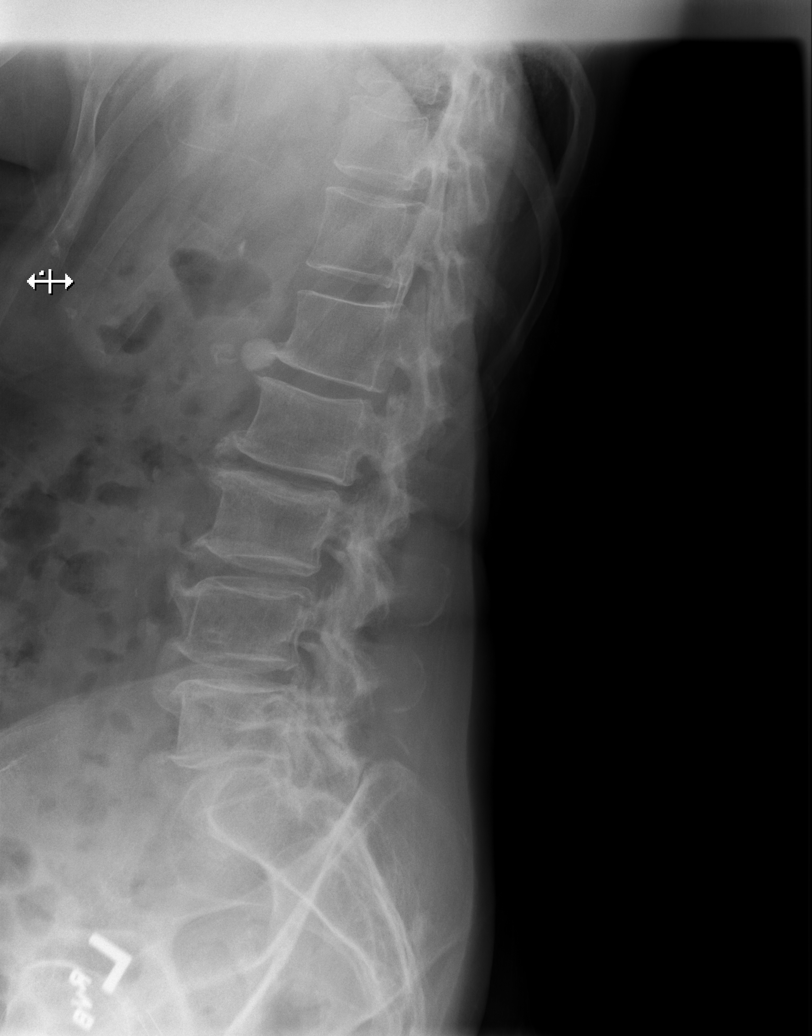

[t lumbar l-5 s-1 spot]
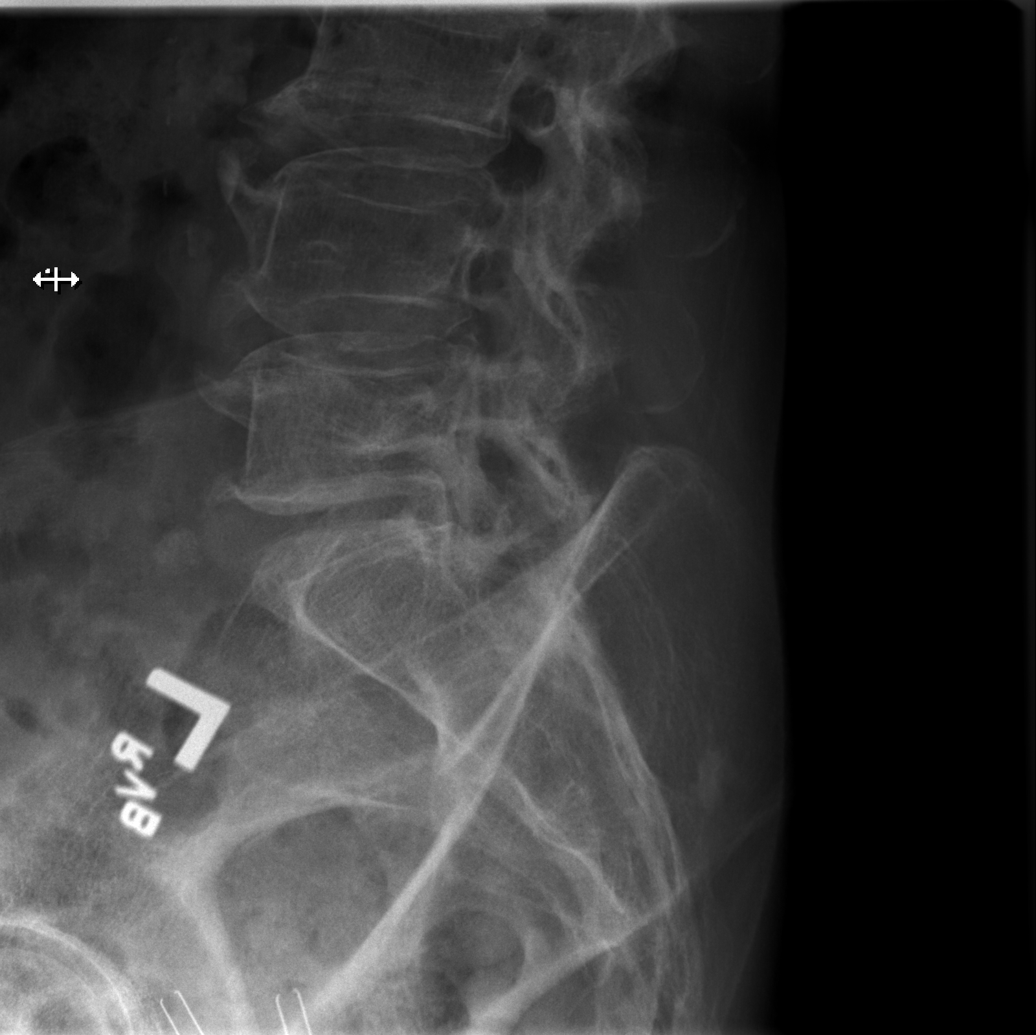

[6 of 6 positions shown; findings below may reference images not displayed]

FINDINGS: Five non-rib-bearing lumbar vertebra.

Osseous demineralization.

Biconvex thoracolumbar scoliosis.

Multilevel disc space narrowing and endplate spur formation.

No fracture, subluxation, or bone destruction.

SI joints preserved.

Tubal ligation clips in pelvis.

BILATERAL renal calculi largest LEFT kidney 13 mm diameter.
IMPRESSION: Degenerative disc disease changes and scoliosis lumbar spine.

No acute osseous abnormalities.

BILATERAL renal calculi.

## 2021-07-08 IMAGING — CR DG KNEE COMPLETE 4+V*R*
4 series · 4 of 4 positions shown · non-contrast
Comparison: None

CLINICAL DATA: Knee pain post fall

EXAM:
RIGHT KNEE - COMPLETE 4+ VIEW

[t knee ap right]
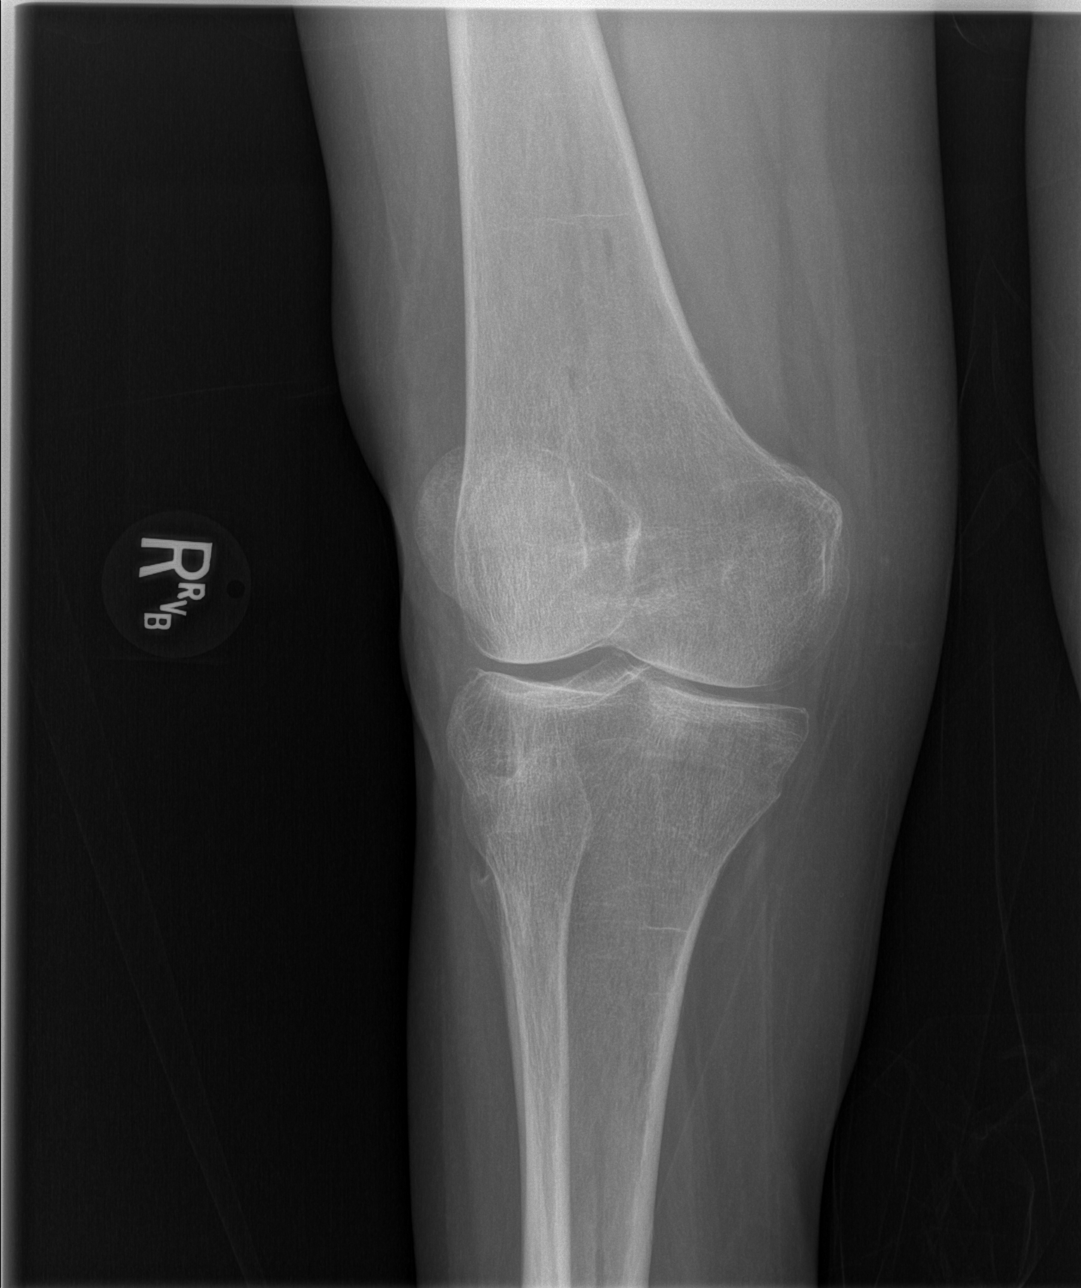

[t knee obl right]
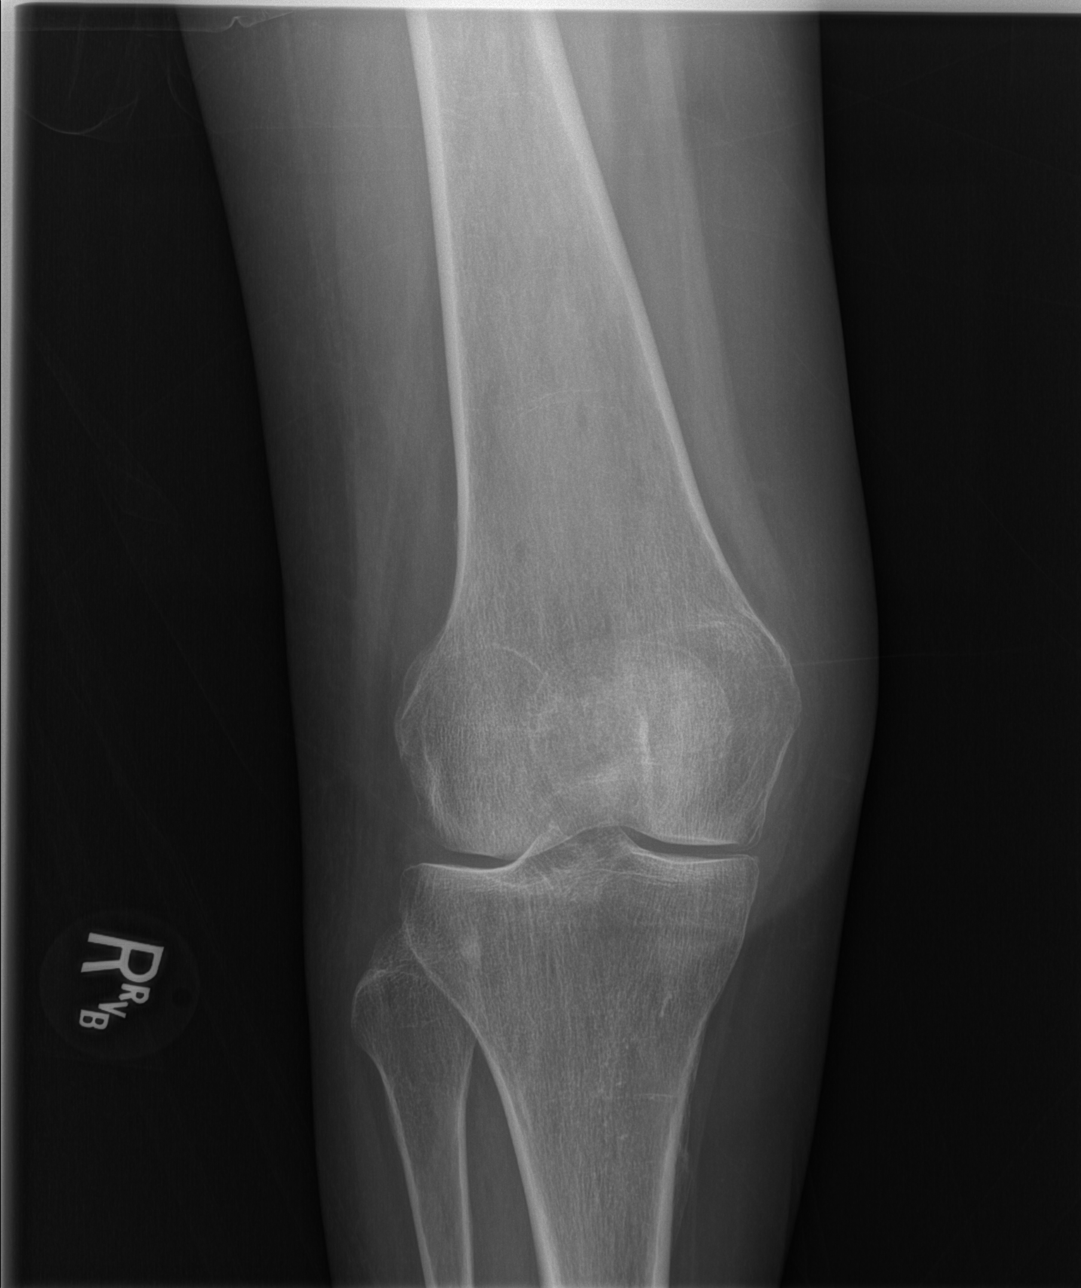

[t knee lat right]
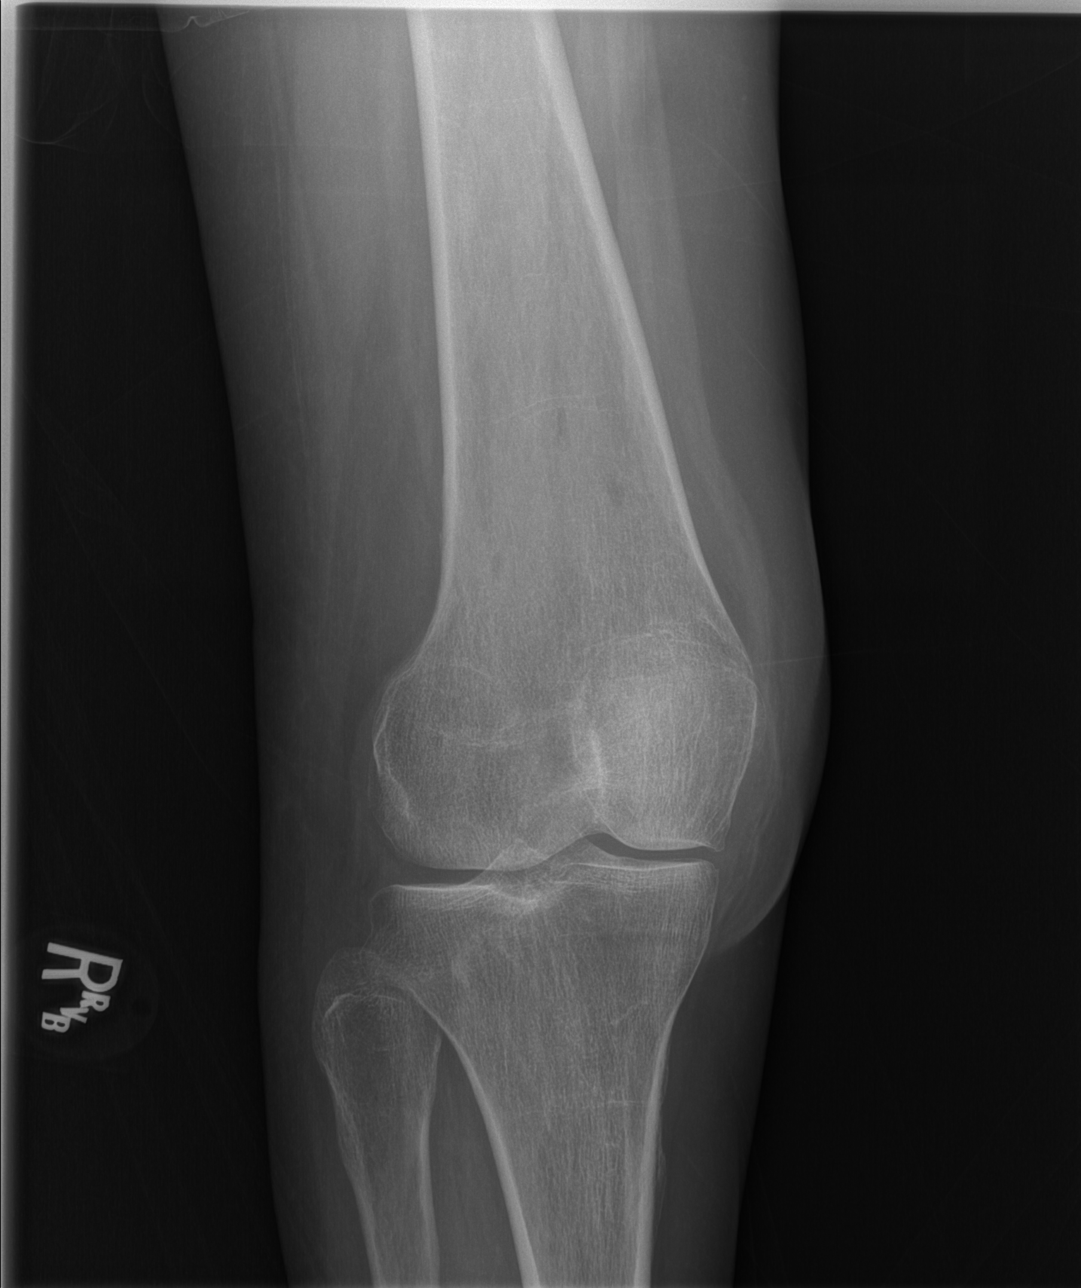

[x knee lat right]
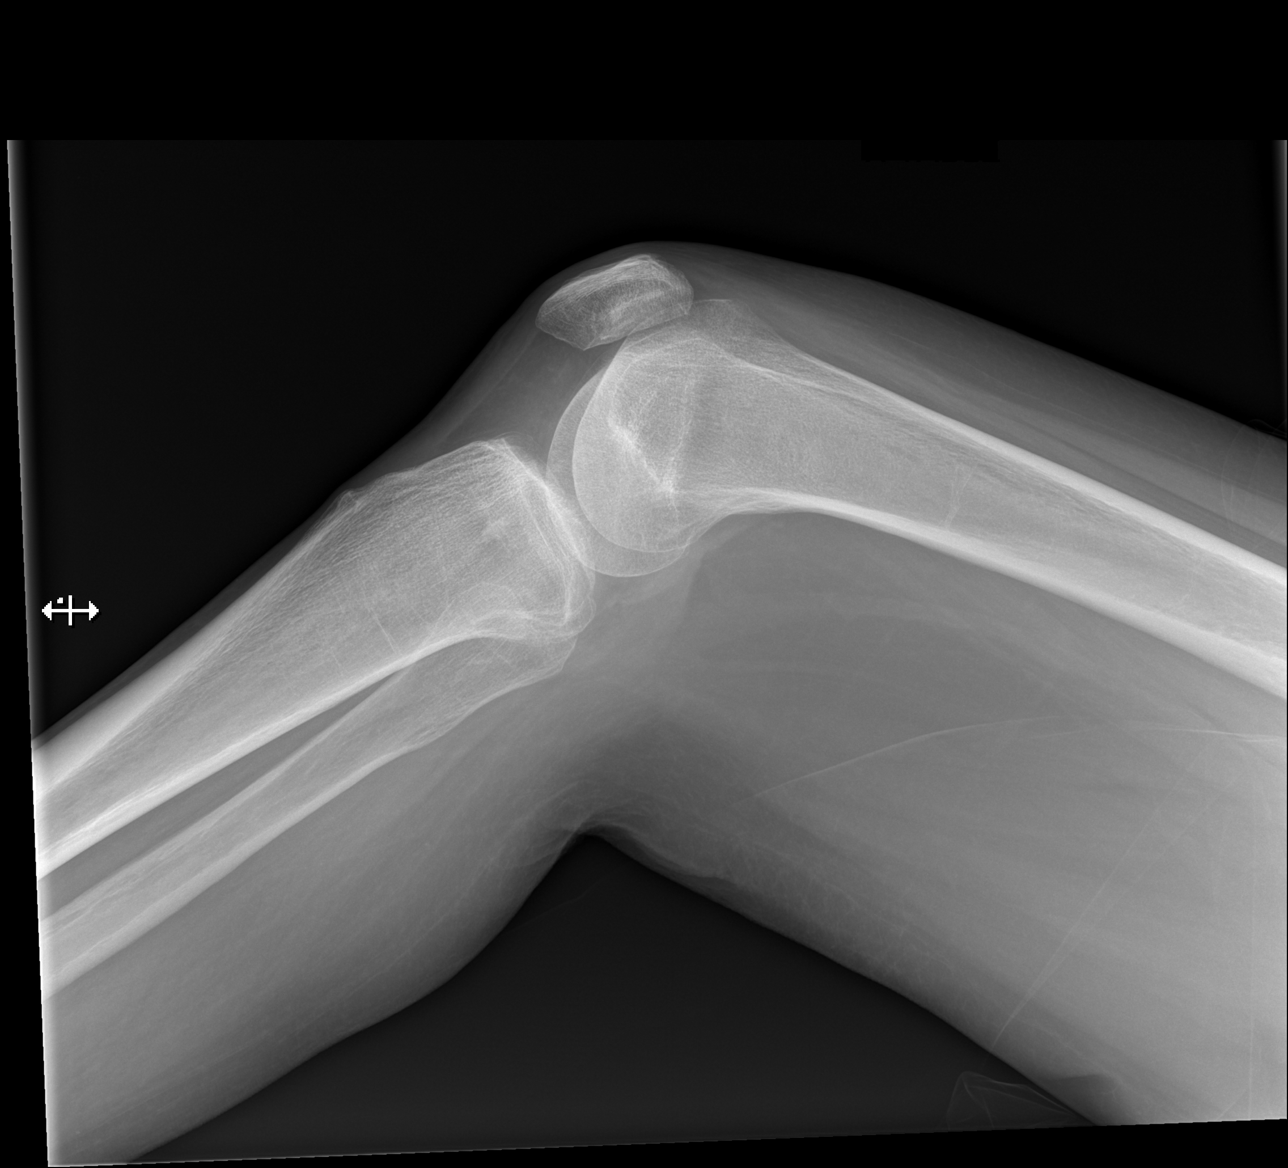

[4 of 4 positions shown; findings below may reference images not displayed]

FINDINGS: Osseous demineralization.

Diffuse joint space narrowing.

No acute fracture, dislocation, or bone destruction.

No joint effusion.
IMPRESSION: No acute osseous abnormalities.

## 2021-07-08 MED ORDER — HYDROMORPHONE HCL 1 MG/ML IJ SOLN
0.5000 mg | INTRAMUSCULAR | Status: DC | PRN
  Administered 2021-07-09 – 2021-07-10 (×4): 0.5 mg via INTRAVENOUS
  Filled 2021-07-08 (×4): qty 0.5

## 2021-07-08 MED ORDER — FENTANYL CITRATE PF 50 MCG/ML IJ SOSY
50.0000 ug | PREFILLED_SYRINGE | Freq: Once | INTRAMUSCULAR | Status: AC
  Administered 2021-07-08: 50 ug via INTRAVENOUS
  Filled 2021-07-08: qty 1

## 2021-07-08 MED ORDER — SODIUM CHLORIDE 0.9 % IV SOLN: INTRAVENOUS | Status: DC

## 2021-07-08 MED ORDER — ONDANSETRON HCL 4 MG PO TABS: 4.0000 mg | ORAL_TABLET | Freq: Four times a day (QID) | ORAL | Status: DC | PRN

## 2021-07-08 MED ORDER — OXYCODONE-ACETAMINOPHEN 5-325 MG PO TABS
1.0000 | ORAL_TABLET | Freq: Once | ORAL | Status: AC
  Administered 2021-07-08: 1 via ORAL
  Filled 2021-07-08: qty 1

## 2021-07-08 MED ORDER — DIVALPROEX SODIUM ER 500 MG PO TB24: 1000.0000 mg | ORAL_TABLET | Freq: Every day | ORAL | Status: DC

## 2021-07-08 MED ORDER — ONDANSETRON HCL 4 MG/2ML IJ SOLN: 4.0000 mg | Freq: Four times a day (QID) | INTRAMUSCULAR | Status: DC | PRN

## 2021-07-08 MED ORDER — BACLOFEN 10 MG PO TABS
10.0000 mg | ORAL_TABLET | Freq: Three times a day (TID) | ORAL | Status: DC | PRN
  Administered 2021-07-09 – 2021-07-12 (×3): 10 mg via ORAL
  Filled 2021-07-08 (×3): qty 1

## 2021-07-08 MED ORDER — ACETAMINOPHEN 650 MG RE SUPP: 650.0000 mg | Freq: Four times a day (QID) | RECTAL | Status: DC | PRN

## 2021-07-08 MED ORDER — MUPIROCIN 2 % EX OINT
1.0000 "application " | TOPICAL_OINTMENT | Freq: Two times a day (BID) | CUTANEOUS | Status: DC
  Administered 2021-07-08 – 2021-07-12 (×8): 1 via NASAL
  Filled 2021-07-08 (×3): qty 22

## 2021-07-08 MED ORDER — OXYCODONE HCL 5 MG PO TABS
5.0000 mg | ORAL_TABLET | ORAL | Status: DC | PRN
  Administered 2021-07-09 – 2021-07-10 (×3): 5 mg via ORAL
  Filled 2021-07-08 (×3): qty 1

## 2021-07-08 MED ORDER — DIVALPROEX SODIUM ER 500 MG PO TB24
1000.0000 mg | ORAL_TABLET | Freq: Every day | ORAL | Status: DC
Start: 2021-07-08 — End: 2021-07-12
  Administered 2021-07-08 – 2021-07-11 (×4): 1000 mg via ORAL
  Filled 2021-07-08 (×4): qty 2

## 2021-07-08 MED ORDER — POVIDONE-IODINE 10 % EX SWAB: 2.0000 "application " | Freq: Once | CUTANEOUS | Status: DC

## 2021-07-08 MED ORDER — TRANEXAMIC ACID-NACL 1000-0.7 MG/100ML-% IV SOLN: 1000.0000 mg | INTRAVENOUS | Status: AC

## 2021-07-08 MED ORDER — TOPIRAMATE 100 MG PO TABS
300.0000 mg | ORAL_TABLET | Freq: Two times a day (BID) | ORAL | Status: DC
  Administered 2021-07-08 – 2021-07-12 (×8): 300 mg via ORAL
  Filled 2021-07-08 (×8): qty 3

## 2021-07-08 MED ORDER — CHLORHEXIDINE GLUCONATE 4 % EX LIQD
60.0000 mL | Freq: Once | CUTANEOUS | Status: AC
  Administered 2021-07-09: 4 via TOPICAL
  Filled 2021-07-08: qty 60

## 2021-07-08 MED ORDER — CEFAZOLIN SODIUM-DEXTROSE 2-4 GM/100ML-% IV SOLN
2.0000 g | INTRAVENOUS | Status: AC
  Administered 2021-07-09: 17:00:00 2 g via INTRAVENOUS
  Filled 2021-07-08: qty 100

## 2021-07-08 MED ORDER — ACETAMINOPHEN 325 MG PO TABS: 650.0000 mg | ORAL_TABLET | Freq: Four times a day (QID) | ORAL | Status: DC | PRN

## 2021-07-08 MED ORDER — ACETAMINOPHEN 500 MG PO TABS: 1000.0000 mg | ORAL_TABLET | Freq: Once | ORAL | Status: DC

## 2021-07-08 MED ORDER — CEFAZOLIN SODIUM-DEXTROSE 2-4 GM/100ML-% IV SOLN
2.0000 g | INTRAVENOUS | Status: AC
  Filled 2021-07-08: qty 100

## 2021-07-08 NOTE — ED Provider Notes (Signed)
[facility] DEPT Provider Note   CSN: [id number] Arrival date & time: 07/08/21  8309     History  Chief Complaint  Patient presents with   Knee Pain    Tonya Thompson is a 66 y.o. 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  Patient states that she was at her dresser pulling out closed for the day when she lost her balance and fell.  She denies head injury or loss of consciousness.  She is complaining of severe right knee pain and lower back pain.  She was given a Tylenol prior to arrival without significant improvement.  She denies new numbness, tingling.  She has no other injuries or complaints.   Knee Pain     Home Medications Prior to Admission medications   Medication Sig Start Date End Date Taking? Authorizing Provider  acetaminophen (TYLENOL) 325 MG tablet Take 650 mg by mouth every 4 (four) hours as needed for moderate pain.    [provider]  baclofen (LIORESAL) 10 MG tablet Take 1 tablet up to four times daily as needed 09/01/20   Cameron Sprang, MD  Calcium Carbonate 500 MG CHEW Chew 500 mg by mouth daily.    [provider]  Calcium Citrate-Vitamin D 315-5 MG-MCG TABS Take 1 tablet by mouth daily at 6 (six) AM.    [provider]  divalproex (DEPAKOTE ER) 500 MG 24 hr tablet Take 2 tablets every night 09/01/20   Cameron Sprang, MD  fexofenadine (ALLEGRA) 180 MG tablet Take 180 mg by mouth daily.    [provider]  guaiFENesin (MUCINEX) 600 MG 12 hr tablet Take 600 mg by mouth 2 (two) times daily as needed for cough.    [provider]  Multiple Vitamins-Minerals (THEREMS-M) TABS Take 1 tablet by mouth daily.    [provider]  nystatin (MYCOSTATIN/NYSTOP) powder Apply  1 application topically 2 (two) times daily as needed.    [provider]  Omega-3 Fatty Acids (FISH OIL PO) Take 1 capsule by mouth daily.    [provider]  Propylene Glycol (SYSTANE BALANCE) 0.6 % SOLN Place 1 drop into both eyes 2 (two) times daily.    [provider]  topiramate (TOPAMAX) 200 MG tablet Take 300 mg by mouth 2 (two) times daily.    [provider]  Vitamin D3 (VITAMIN D) 25 MCG tablet Take 1,000 Units by mouth daily.    [provider]  zinc oxide 20 % ointment Apply 1 application topically as needed for irritation.    [provider]      Allergies    Aspirin, Cheese, Chocolate, Coffee flavor, Other, and Red wine complex [germanium]    Review of Systems   Review of Systems  Physical Exam Updated Vital Signs BP (!) 131/95 (BP Location: Left Arm)    Pulse 83    Temp 98.3 F (36.8 C) (Oral)    Resp 16    Ht 5' 5.5" (1.664 m)    Wt 65.3 kg    SpO2 100%    BMI 23.60 kg/m  Physical Exam Vitals and nursing note reviewed.  Constitutional:      General: She is not in acute distress.    Appearance: She is not ill-appearing.  Comments: Thin  HENT:     Head: Atraumatic.  Eyes:     Conjunctiva/sclera: Conjunctivae normal.  Cardiovascular:     Rate and Rhythm: Normal rate and regular rhythm.     Pulses: Normal pulses.     Heart sounds: No murmur heard. Pulmonary:     Effort: Pulmonary effort is normal. No respiratory distress.     Breath sounds: Normal breath sounds.  Abdominal:     General: Abdomen is flat. There is no distension.     Palpations: Abdomen is soft.     Tenderness: There is no abdominal tenderness.  Musculoskeletal:     Cervical back: Normal range of motion.     Comments: Right leg with significant muscular atrophy from residual stroke defect.  Right knee with moderate swelling, tender to palpation.  Significant swelling and tenderness over the right proximal thigh with obvious external  rotation of right hip.  ROM unable to assess given patient's residual stroke weakness.  Tender to palpation of the lower lumbar/sacral region at midline.  Skin:    General: Skin is warm and dry.     Capillary Refill: Capillary refill takes less than 2 seconds.  Neurological:     General: No focal deficit present.     Mental Status: She is alert.  Psychiatric:        Mood and Affect: Mood normal.    ED Results / Procedures / Treatments   Labs (all labs ordered are listed, but only abnormal results are displayed) Labs Reviewed  RESP PANEL BY RT-PCR (FLU A&B, COVID) [id number]  BASIC METABOLIC PANEL  CBC WITH DIFFERENTIAL/PLATELET  URINALYSIS, ROUTINE W REFLEX MICROSCOPIC    EKG None  Radiology DG Chest 1 View  Result Date: 07/08/2021 CLINICAL DATA:  [place] 3 hours ago, hip pain EXAM: CHEST  1 VIEW COMPARISON:  04/21/2021 FINDINGS: Normal heart size, mediastinal contours, and pulmonary vascularity. Lungs clear. Marker RIGHT upper lobe. No acute infiltrate, pleural effusion, or pneumothorax. Skin folds project over chest. Bones demineralized with chronic RIGHT glenohumeral degenerative changes. IMPRESSION: No acute abnormalities. Electronically Signed   By: Lavonia Dana M.D.   On: 07/08/2021 10:38   DG Thoracic Spine 2 View  Result Date: 07/08/2021 CLINICAL DATA:  [place] 3 hours ago EXAM: THORACIC SPINE 2 VIEWS COMPARISON:  None; correlation CT chest 04/15/2021 FINDINGS: Twelve pairs of ribs. Bones demineralized. Dextroconvex scoliosis lower thoracic spine. Mild superior endplate compression deformity of approximately L3 vertebral body new since 04/15/2021 Vertebral body heights otherwise maintained without additional fracture, subluxation, or bone destruction. IMPRESSION: Osseous demineralization with dextroconvex thoracic scoliosis. New mild superior endplate compression deformity of T3 vertebral body since 04/15/2021. Electronically Signed   By: Lavonia Dana M.D.   On: 07/08/2021 10:36   DG  Lumbar Spine Complete  Result Date: 07/08/2021 CLINICAL DATA:  [place] 3 hours ago EXAM: LUMBAR SPINE - COMPLETE 4+ VIEW COMPARISON:  None FINDINGS: Five non-rib-bearing lumbar vertebra. Osseous demineralization. Biconvex thoracolumbar scoliosis. Multilevel disc space narrowing and endplate spur formation. No fracture, subluxation, or bone destruction. SI joints preserved. Tubal ligation clips in pelvis. BILATERAL renal calculi largest LEFT kidney 13 mm diameter. IMPRESSION: Degenerative disc disease changes and scoliosis lumbar spine. No acute osseous abnormalities. BILATERAL renal calculi. Electronically Signed   By: Lavonia Dana M.D.   On: 07/08/2021 10:34   DG Pelvis 1-2 Views  Result Date: 07/08/2021 CLINICAL DATA:  [place] 3 hours ago, hip pain EXAM: PELVIS - 1-2 VIEW COMPARISON:  None FINDINGS: Osseous demineralization. Hip and  SI joint spaces preserved. Mildly distracted and angulated intertrochanteric fracture RIGHT femur. No additional fracture, dislocation, or bone destruction. Tubal ligation clips in pelvis. IMPRESSION: Mildly displaced and angulated intertrochanteric fracture RIGHT femur. Electronically Signed   By: Lavonia Dana M.D.   On: 07/08/2021 10:37   DG Knee Complete 4 Views Right  Result Date: 07/08/2021 CLINICAL DATA:  Knee pain post fall EXAM: RIGHT KNEE - COMPLETE 4+ VIEW COMPARISON:  None FINDINGS: Osseous demineralization. Diffuse joint space narrowing. No acute fracture, dislocation, or bone destruction. No joint effusion. IMPRESSION: No acute osseous abnormalities. Electronically Signed   By: Lavonia Dana M.D.   On: 07/08/2021 10:30   DG FEMUR, MIN 2 VIEWS RIGHT  Result Date: 07/08/2021 CLINICAL DATA:  Hip pain post fall 3 hours ago EXAM: RIGHT FEMUR 2 VIEWS COMPARISON:  08/22/2020 FINDINGS: Osseous demineralization. RIGHT hip and SI joint spaces preserved. Minimally distracted and angulated intertrochanteric fracture RIGHT femur. No dislocation. Visualized pelvis intact. IMPRESSION:  Minimally distracted and angulated intertrochanteric fracture RIGHT femur. Electronically Signed   By: Lavonia Dana M.D.   On: 07/08/2021 10:31    Procedures Procedures    Medications Ordered in ED Medications  oxyCODONE-acetaminophen (PERCOCET/ROXICET) 5-325 MG per tablet 1 tablet (1 tablet Oral Given 07/08/21 1019)  fentaNYL (SUBLIMAZE) injection 50 mcg (50 mcg Intravenous Given 07/08/21 1120)    ED Course/ Medical Decision Making/ A&P                           Medical Decision Making Amount and/or Complexity of Data Reviewed Labs: ordered. Radiology: ordered.  Risk Prescription drug management. Decision regarding hospitalization.   History:  Per HPI Social determinants of health: Pt lives at [facility]   Initial impression:  This patient presents to the ED for concern of knee and back injury after a fall, this involves an extensive number of treatment options, and is a complaint that carries with it a high risk of complications and morbidity.    Patient is a thin 66 year old female who appears older than her stated age.  Significant right-sided weakness secondary to previous strokes.  Right knee exam with mild to moderate effusion and tender to palpation.  Similar midline tenderness to palpation at the lower lumbar.  Will obtain x-rays of the right knee, femur and of the lumbar spine.  Pain management indicated.   Lab Tests and EKG:  I Ordered, reviewed, and interpreted labs and EKG.  The pertinent results include:  Pending BMP, UA, CBC and respiratory panel   Imaging Studies ordered:  I ordered imaging studies including  X-ray of right knee without acute traumatic injury X-ray of right femur with evidence of mildly displaced intertrochanteric fracture X-ray T-spine with new compression deformity of the T3 vertebrae X-ray lumbar spine without acute findings I independently visualized and interpreted imaging and I agree with the radiologist interpretation.     Cardiac Monitoring:  The patient was maintained on a cardiac monitor.  I personally viewed and interpreted the cardiac monitored which showed an underlying rhythm of: NSR   Medicines ordered and prescription drug management:  I ordered medication including: Percocet for pain Fentanyl 50 mcg for pain Reevaluation of the patient after these medicines showed that the patient improved I have reviewed the patients home medicines and have made adjustments as needed    Consultations Obtained:  I requested consultation with orthopedic surgery and spoke with Dr. Lyla Glassing,  and discussed lab and imaging findings as  well as pertinent plan - they recommend that patient be admitted to medicine for surgery likely done today.  He is going to try and fit her in today, however he may need to postpone until tomorrow.  Disposition:  After consideration of the diagnostic results, physical exam, history and the patients response to treatment feel that the patent would benefit from admission for surgery.   Intertrochanteric fracture of the right femur Compression fracture T12: Spoke to Dr. Olevia Bowens who agrees to admit patient while she awaits surgery. Ultimate treatment and disposition to be determined by admitting provider.     Final Clinical Impression(s) / ED Diagnoses Final diagnoses:  Closed displaced intertrochanteric fracture of right femur, initial encounter ([place])  Closed wedge compression fracture of T3 vertebra, initial encounter [facility])    Rx / DC Orders ED Discharge Orders     None         Tonye Pearson, PA-C 07/08/21 1133    Milton Ferguson, MD 07/08/21 [phone]

## 2021-07-08 NOTE — ED Notes (Signed)
Patient transported to X-ray 

## 2021-07-08 NOTE — H&P (Addendum)
History and Physical    Patient: Tonya Thompson GGY:[id number] DOB: 1955/12/16 DOA: 07/08/2021 DOS: the patient was seen and examined on 07/08/2021 PCP: Unk Pinto, MD  Patient coming from: Home  Chief Complaint:  Chief Complaint  Patient presents with   Knee Pain   HPI: Tonya Thompson is a 66 y.o. 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  She stated that she lost her balance and denied any prodromal symptoms like palpitations, dizziness, diaphoresis, dyspnea or chest pain.  No PND, orthopnea or pitting edema lower extremities.  Denied abdominal pain, nausea, emesis, diarrhea, constipation, melena or hematochezia.  No flank pain, dysuria or hematuria.  No polyuria, polydipsia, polyphagia or blurred vision.  ED course: Initial vital signs were temperature 98.3 F, pulse 83, respirations 16, BP 131/95 mmHg and O2 sat 100% on room air.  The patient received 50 mcg of fentanyl and Percocet 5/325 mg 1 tablet p.o.  Orthopedic surgery was consulted and is planning for surgery later today or tomorrow morning.  Lab work: CBC showed a white count 6.5, hemoglobin 15.3 g/dL with an MCV of 106.3 fL and platelets 93.  BMP with normal electrolytes and glucose.  BUN was 26 and creatinine 1.32 mg/dL.  LFTs show an albumin of 3.2 g/dL and AST of 57 units/L, the rest of the hepatic functions were normal.  Imaging: Right knee x-ray with no acute osseous abnormality.  Portable chest radiograph with no acute cardiopulmonary pathology.  Right femur and pelvic x-ray showed right femur intertrochanteric fracture.  Thoracic spine show a new endplate  compression deformity of T3 which is new since 04/15/2021.  She has also showed demineralization with dextroconvex thoracic scoliosis.  No acute findings on lumbosacral spine.  Please see images and full radiology report for further details.   Review of Systems: As mentioned in the history of present illness. All other systems reviewed and are negative. Past Medical History:  Diagnosis Date   Acute kidney injury ([place]) 09/03/2019   05-2019 kidney stone removed   Allergy    seasonal allergies   Arthritis    LEFT hand   AVM (arteriovenous malformation) brain    Cataract    bilateral-not a surgical candidate at this time (07/23/2020)   COVID-19 virus infection 08/2020   GERD (gastroesophageal reflux disease)    on meds   Hyperlipidemia    diet controlled   Insulin resistance 02/16/2018   Seizures ([place])    Grand Mal & Partial complex seizure disorder-last 1998   Stroke [facility])    5 total so far (age [phone])   Ureteral stone 05/10/2019   has multiple kidney stones noted from Urologist   Uses roller walker    Past Surgical History:  Procedure Laterality Date   BRAIN SURGERY  09/19/1978   at [facility], Dr. Leida Lauth   BRONCHIAL BIOPSY  04/21/2021   Procedure: BRONCHIAL BIOPSIES;  Surgeon: Garner Nash, DO;  Location: [facility];  Service: Pulmonary;;   BRONCHIAL BRUSHINGS  04/21/2021   Procedure: BRONCHIAL BRUSHINGS;  Surgeon: Garner Nash, DO;  Location: [facility];  Service: Pulmonary;;   BRONCHIAL WASHINGS  04/21/2021   Procedure: BRONCHIAL WASHINGS;  Surgeon: Garner Nash, DO;  Location: [place];  Service: Pulmonary;;   COLONOSCOPY  2017   [place], [place]   CYSTOSCOPY/URETEROSCOPY/HOLMIUM LASER/STENT PLACEMENT Left 05/10/2019   Procedure: CYSTOSCOPY/RETROGRADE/URETEROSCOPY/HOLMIUM LASER/STENT PLACEMENT;  Surgeon: Raynelle Bring, MD;  Location: [facility];  Service: Urology;  Laterality: Left;   FIDUCIAL MARKER PLACEMENT  04/21/2021   Procedure: FIDUCIAL MARKER  PLACEMENT;  Surgeon: Garner Nash, DO;  Location: [facility];  Service: Pulmonary;;   FOOT SURGERY Right 10/1985   ankle fusion, at [facility], Dr. Lorelee Cover   POLYPECTOMY  2017   multiple adenomas   [place]  04/21/2021   Procedure: RADIAL ENDOBRONCHIAL ULTRASOUND;  Surgeon: Garner Nash, DO;  Location: [facility] [facility];  Service: Pulmonary;;   WISDOM TOOTH EXTRACTION     Social History:  reports that she has never smoked. She has been exposed to tobacco smoke. She has never used smokeless tobacco. She reports that she does not drink alcohol and does not use drugs.  Allergies  Allergen Reactions   Aspirin Other (See Comments)    CONTRAINDICATED DUE TO HISTORY OF BLEEDING IN BRAIN   Cheese     Cant take due to history of severe migraines   Chocolate     Cant take due to history of severe migraines   Coffee Flavor     Cant take due to history of severe migraines   Other      Nuts - Cant take due to history of severe migraines   Red Wine Complex [Germanium]     Cant take due to history of severe migraines    Family History  Problem Relation Age of Onset   Heart disease Mother    Heart failure Mother    Heart attack Mother    Diabetes Father    Hyperlipidemia Father    Dementia Father    Prostate cancer Father    Melanoma Father    Colon polyps Father    Hyperlipidemia Paternal Uncle    Hypertension Paternal Uncle    Dementia Paternal Uncle    Heart attack Maternal Grandfather    Stroke Paternal Grandmother    CAD Maternal Uncle    COPD Maternal Uncle        smoker   Breast cancer Neg Hx    Colon cancer Neg Hx    Esophageal cancer Neg Hx    Stomach cancer Neg Hx    Rectal cancer Neg Hx     Prior to Admission medications   Medication Sig Start Date End Date Taking? Authorizing Provider  acetaminophen (TYLENOL) 325 MG tablet Take 650 mg by mouth every 4 (four) hours as needed for headache or mild  pain.   Yes [provider]  baclofen (LIORESAL) 10 MG tablet Take 1 tablet up to four times daily as needed 09/01/20  Yes Cameron Sprang, MD  Calcium Carbonate 500 MG CHEW Chew 500 mg by mouth daily.   Yes [provider]  Calcium Citrate-Vitamin D 315-5 MG-MCG TABS Take 1 tablet by mouth daily at 6 (six) AM.   Yes [provider]  divalproex (DEPAKOTE ER) 500 MG 24 hr tablet Take 2 tablets every night Patient taking differently: Take 1,000 mg by mouth daily. 09/01/20  Yes Cameron Sprang, MD  fexofenadine (ALLEGRA) 180 MG tablet Take 180 mg by mouth daily.   Yes [provider]  guaiFENesin (MUCINEX) 600 MG 12 hr tablet Take 600 mg by mouth 2 (two) times daily as needed for cough.  Yes [provider]  Multiple Vitamins-Minerals (THEREMS-M) TABS Take 1 tablet by mouth daily.   Yes [provider]  nystatin (MYCOSTATIN/NYSTOP) powder Apply 1 application topically 2 (two) times daily as needed.   Yes [provider]  Omega-3 Fatty Acids (FISH OIL PO) Take 1 capsule by mouth daily.   Yes [provider]  Propylene Glycol (SYSTANE BALANCE) 0.6 % SOLN Place 1 drop into both eyes 2 (two) times daily.   Yes [provider]  topiramate (TOPAMAX) 200 MG tablet Take 300 mg by mouth 2 (two) times daily.   Yes [provider]  Vitamin D3 (VITAMIN D) 25 MCG tablet Take 1,000 Units by mouth daily.   Yes [provider]  zinc oxide 20 % ointment Apply 1 application topically as needed for irritation.   Yes [provider]    Physical Exam: Vitals:   07/08/21 1145 07/08/21 1200 07/08/21 1215 07/08/21 1321  BP: 123/87 135/82 118/81 133/85  Pulse: 77 74 70 77  Resp:  '15 16 15  '$ Temp:    97.6 F (36.4 C)  TempSrc:    Oral  SpO2: 97% 99% 99% 100%  Weight:      Height:       Physical Exam Vitals and nursing note reviewed.  Constitutional:      Appearance: She is normal weight.  HENT:     Head:  Normocephalic.     Mouth/Throat:     Mouth: Mucous membranes are moist.  Eyes:     Pupils: Pupils are equal, round, and reactive to light.  Neck:     Vascular: No JVD.  Cardiovascular:     Rate and Rhythm: Normal rate and regular rhythm.  Pulmonary:     Effort: Pulmonary effort is normal.     Breath sounds: Normal breath sounds.  Abdominal:     General: There is no distension.     Palpations: Abdomen is soft.     Tenderness: There is no abdominal tenderness.  Musculoskeletal:     Cervical back: Neck supple.     Right lower leg: Deformity and tenderness present. No edema.     Left lower leg: No edema.     Comments: RLE is shortened and laterally rotated with good distal pulses.  Skin:    General: Skin is warm and dry.  Neurological:     Mental Status: She is alert. Mental status is at baseline.     Motor: Weakness and atrophy present.     Comments: Right sided hemiparesis.  Psychiatric:        Mood and Affect: Mood normal.        Behavior: Behavior normal.    Data Reviewed:  There are no new results to review at this time.  Assessment and Plan: Principal Problem:   Closed intertrochanteric fracture of right femur ([place]) Admit to telemetry/inpatient. Keep n.p.o. pending orthopedic surgery evaluation. Buck's traction per protocol. Local care per protocol. Continue baclofen as needed. Analgesics as needed. Antiemetics as needed. Consult TOC team. Consult nutritional services. Consult PT postoperatively.  Active Problems:   Compression fracture of T3 vertebra (HCC) Analgesics as needed.    Osteopenia On vitamin D supplementation and biphosphonate at home.    Epilepsy, grand mal ([place]) Continue Depakote and Topamax pending med reconciliation.    Right hemiparesis ([place]) Supportive care.    Hyperlipidemia, mixed Continue omega-3 fatty acids. Check fasting lipids.    CKD (chronic kidney disease) stage 3, GFR 30-59 ml/min (HCC) Monitor renal  function  electrolytes.    Coronary artery calcification EKG non-ischemic. However, ED shows previous MI. Aspirin contraindicated due to brain AVM.[place] Kitchen Check echocardiogram. Follow-up with cardiology as an outpatient    Mild protein malnutrition ([place]) Protein supplementation. Consult nutritional services.    Macrocytosis Check M78 and folic acid level.    Abnormal LFTs Repeat LFTs in the morning.    Advance Care Planning:   Code Status: Full Code   Consults: Orthopedic surgery Rod Can, MD).  Family Communication:   Severity of Illness: The appropriate patient status for this patient is INPATIENT. Inpatient status is judged to be reasonable and necessary in order to provide the required intensity of service to ensure the patient's safety. The patient's presenting symptoms, physical exam findings, and initial radiographic and laboratory data in the context of their chronic comorbidities is felt to place them at high risk for further clinical deterioration. Furthermore, it is not anticipated that the patient will be medically stable for discharge from the hospital within 2 midnights of admission.   * I certify that at the point of admission it is my clinical judgment that the patient will require inpatient hospital care spanning beyond 2 midnights from the point of admission due to high intensity of service, high risk for further deterioration and high frequency of surveillance required.*  Author: Reubin Milan, MD 07/08/2021 2:20 PM  For on call review [email or web address].   This document was prepared using Dragon voice ovomucin software and may contain some unintended transcription errors.

## 2021-07-08 NOTE — Progress Notes (Signed)
Consult received for R IT femur fx. Plan for surgery tomorrow. NPO After MN. Hold chemical DVT ppx. Full consult to follow. ?

## 2021-07-08 NOTE — ED Triage Notes (Signed)
Pt bib [facility] from [facility] s/p mechanical fall this morning. Initially denies neck/back pain however endorsed lower back pain upon arrival.  Denies hitting head or LOC.  ?

## 2021-07-09 ENCOUNTER — Encounter (HOSPITAL_COMMUNITY)
Admission: EM | Disposition: A | Discharge status: Skilled Nursing Facility | Payer: Self-pay | Source: Skilled Nursing Facility | Attending: Internal Medicine

## 2021-07-09 ENCOUNTER — Encounter (HOSPITAL_COMMUNITY): Payer: Self-pay | Admitting: Anesthesiology

## 2021-07-09 ENCOUNTER — Inpatient Hospital Stay (HOSPITAL_COMMUNITY): Payer: Medicare Other

## 2021-07-09 ENCOUNTER — Inpatient Hospital Stay (HOSPITAL_COMMUNITY): Payer: Medicare Other | Admitting: Certified Registered Nurse Anesthetist

## 2021-07-09 ENCOUNTER — Other Ambulatory Visit (HOSPITAL_COMMUNITY): Payer: Medicare Other

## 2021-07-09 DIAGNOSIS — I6990 Unspecified sequelae of unspecified cerebrovascular disease: Secondary | ICD-10-CM

## 2021-07-09 DIAGNOSIS — D696 Thrombocytopenia, unspecified: Secondary | ICD-10-CM

## 2021-07-09 DIAGNOSIS — I10 Essential (primary) hypertension: Secondary | ICD-10-CM

## 2021-07-09 DIAGNOSIS — I2510 Atherosclerotic heart disease of native coronary artery without angina pectoris: Secondary | ICD-10-CM

## 2021-07-09 DIAGNOSIS — S72141A Displaced intertrochanteric fracture of right femur, initial encounter for closed fracture: Secondary | ICD-10-CM | POA: Diagnosis not present

## 2021-07-09 DIAGNOSIS — I5030 Unspecified diastolic (congestive) heart failure: Secondary | ICD-10-CM

## 2021-07-09 HISTORY — PX: INTRAMEDULLARY (IM) NAIL INTERTROCHANTERIC: SHX5875

## 2021-07-09 LAB — CBC
HCT: 42.5 % (ref 36.0–46.0)
Hemoglobin: 14.5 g/dL (ref 12.0–15.0)
MCH: 36.1 pg — ABNORMAL HIGH (ref 26.0–34.0)
MCHC: 34.1 g/dL (ref 30.0–36.0)
MCV: 105.7 fL — ABNORMAL HIGH (ref 80.0–100.0)
Platelets: 91 10*3/uL — ABNORMAL LOW (ref 150–400)
RBC: 4.02 MIL/uL (ref 3.87–5.11)
RDW: 12.3 % (ref 11.5–15.5)
WBC: 9 10*3/uL (ref 4.0–10.5)
nRBC: 0 % (ref 0.0–0.2)

## 2021-07-09 LAB — COMPREHENSIVE METABOLIC PANEL
ALT: 28 U/L (ref 0–44)
AST: 41 U/L (ref 15–41)
Albumin: 2.8 g/dL — ABNORMAL LOW (ref 3.5–5.0)
Alkaline Phosphatase: 63 U/L (ref 38–126)
Anion gap: 8 (ref 5–15)
BUN: 29 mg/dL — ABNORMAL HIGH (ref 8–23)
CO2: 20 mmol/L — ABNORMAL LOW (ref 22–32)
Calcium: 8.9 mg/dL (ref 8.9–10.3)
Chloride: 109 mmol/L (ref 98–111)
Creatinine, Ser: 1.47 mg/dL — ABNORMAL HIGH (ref 0.44–1.00)
GFR, Estimated: 40 mL/min — ABNORMAL LOW (ref >60)
Glucose, Bld: 153 mg/dL — ABNORMAL HIGH (ref 70–99)
Potassium: 3.8 mmol/L (ref 3.5–5.1)
Sodium: 137 mmol/L (ref 135–145)
Total Bilirubin: 0.6 mg/dL (ref 0.3–1.2)
Total Protein: 5.6 g/dL — ABNORMAL LOW (ref 6.5–8.1)

## 2021-07-09 LAB — LIPID PANEL
Cholesterol: 158 mg/dL (ref 0–200)
HDL: 58 mg/dL (ref >40)
LDL Cholesterol: 85 mg/dL (ref 0–99)
Total CHOL/HDL Ratio: 2.7 RATIO
Triglycerides: 74 mg/dL (ref <150)
VLDL: 15 mg/dL (ref 0–40)

## 2021-07-09 LAB — ECHOCARDIOGRAM COMPLETE
AR max vel: 2.53 cm2
AV Peak grad: 3.8 mmHg
Ao pk vel: 0.97 m/s
Area-P 1/2: 6.37 cm2
Calc EF: 57.9 %
Height: 65.5 in
S' Lateral: 2.4 cm
Single Plane A2C EF: 58.8 %
Single Plane A4C EF: 57.6 %
Weight: 2304 oz

## 2021-07-09 LAB — TYPE AND SCREEN
ABO/RH(D): O POS
Antibody Screen: NEGATIVE

## 2021-07-09 LAB — HIV ANTIBODY (ROUTINE TESTING W REFLEX): HIV Screen 4th Generation wRfx: NONREACTIVE

## 2021-07-09 LAB — VITAMIN B12: Vitamin B-12: 853 pg/mL (ref 180–914)

## 2021-07-09 LAB — GLUCOSE, CAPILLARY
Glucose-Capillary: 118 mg/dL — ABNORMAL HIGH (ref 70–99)
Glucose-Capillary: 127 mg/dL — ABNORMAL HIGH (ref 70–99)

## 2021-07-09 LAB — FOLATE: Folate: 45.2 ng/mL (ref >5.9)

## 2021-07-09 LAB — ABO/RH: ABO/RH(D): O POS

## 2021-07-09 IMAGING — DX DG PORTABLE PELVIS
1 series · 1 of 1 positions shown · non-contrast
Comparison: Pelvis x-ray [DATE]

CLINICAL DATA: Status post intramedullary nail.

EXAM:
PORTABLE PELVIS 1-2 VIEWS

[pelvis ap]
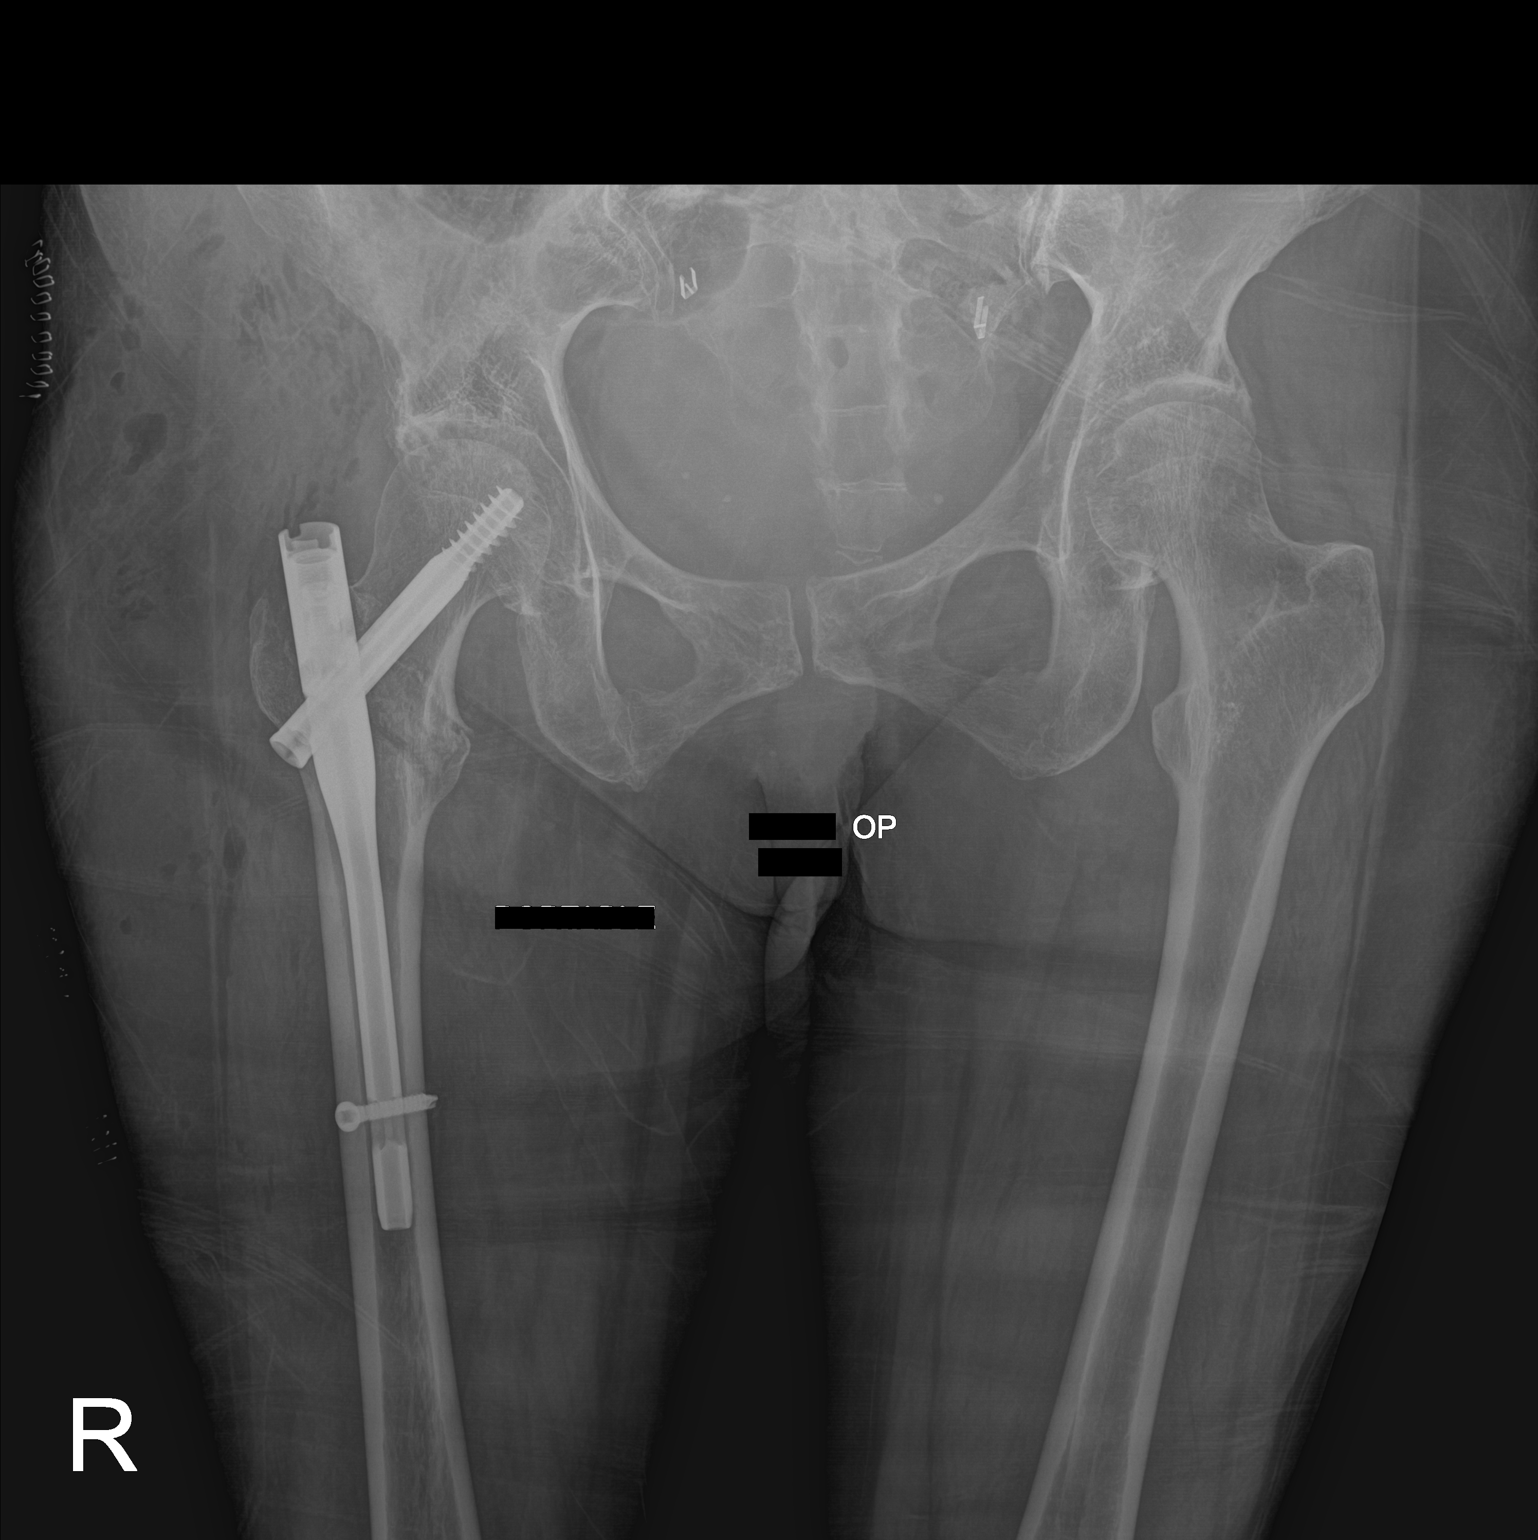

[1 of 1 positions shown; findings below may reference images not displayed]

FINDINGS: There is a new short stem right femoral intramedullary nail and hip
screw fixating intratrochanteric fracture. Alignment is anatomic.
There is lateral soft tissue swelling and air compatible with recent
surgery. There is no dislocation. There are surgical clips in the
pelvis.
IMPRESSION: 1. ORIF right femoral intratrochanteric fracture. Alignment is
anatomic.

## 2021-07-09 IMAGING — RF DG HIP (WITH OR WITHOUT PELVIS) 2-3V*R*
1 series · 2 of 2 positions shown · non-contrast
Comparison: Right femur radiographs [DATE]

CLINICAL DATA: Intramedullary nail intertrochanteric right femur
fluoroscopy

EXAM:
DG HIP (WITH OR WITHOUT PELVIS) 2-3V RIGHT

[Series 1: unknown protocol · 0.20mm/px · 2 of 2 slices shown]
[im 1/2]
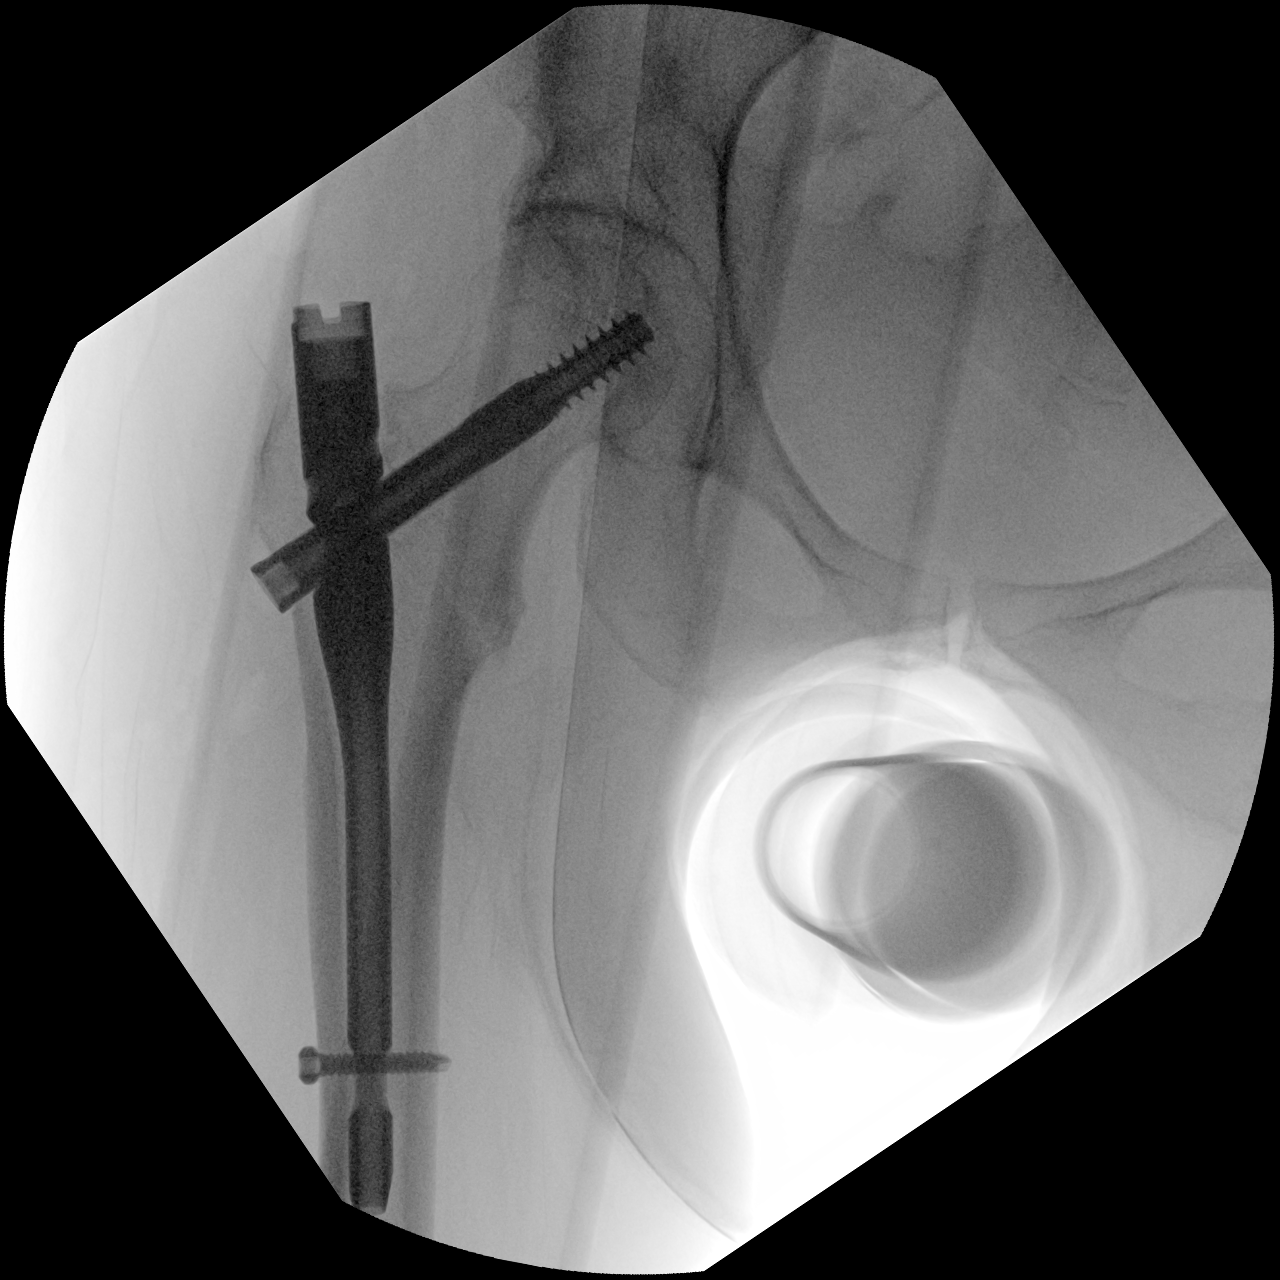
[im 2/2]
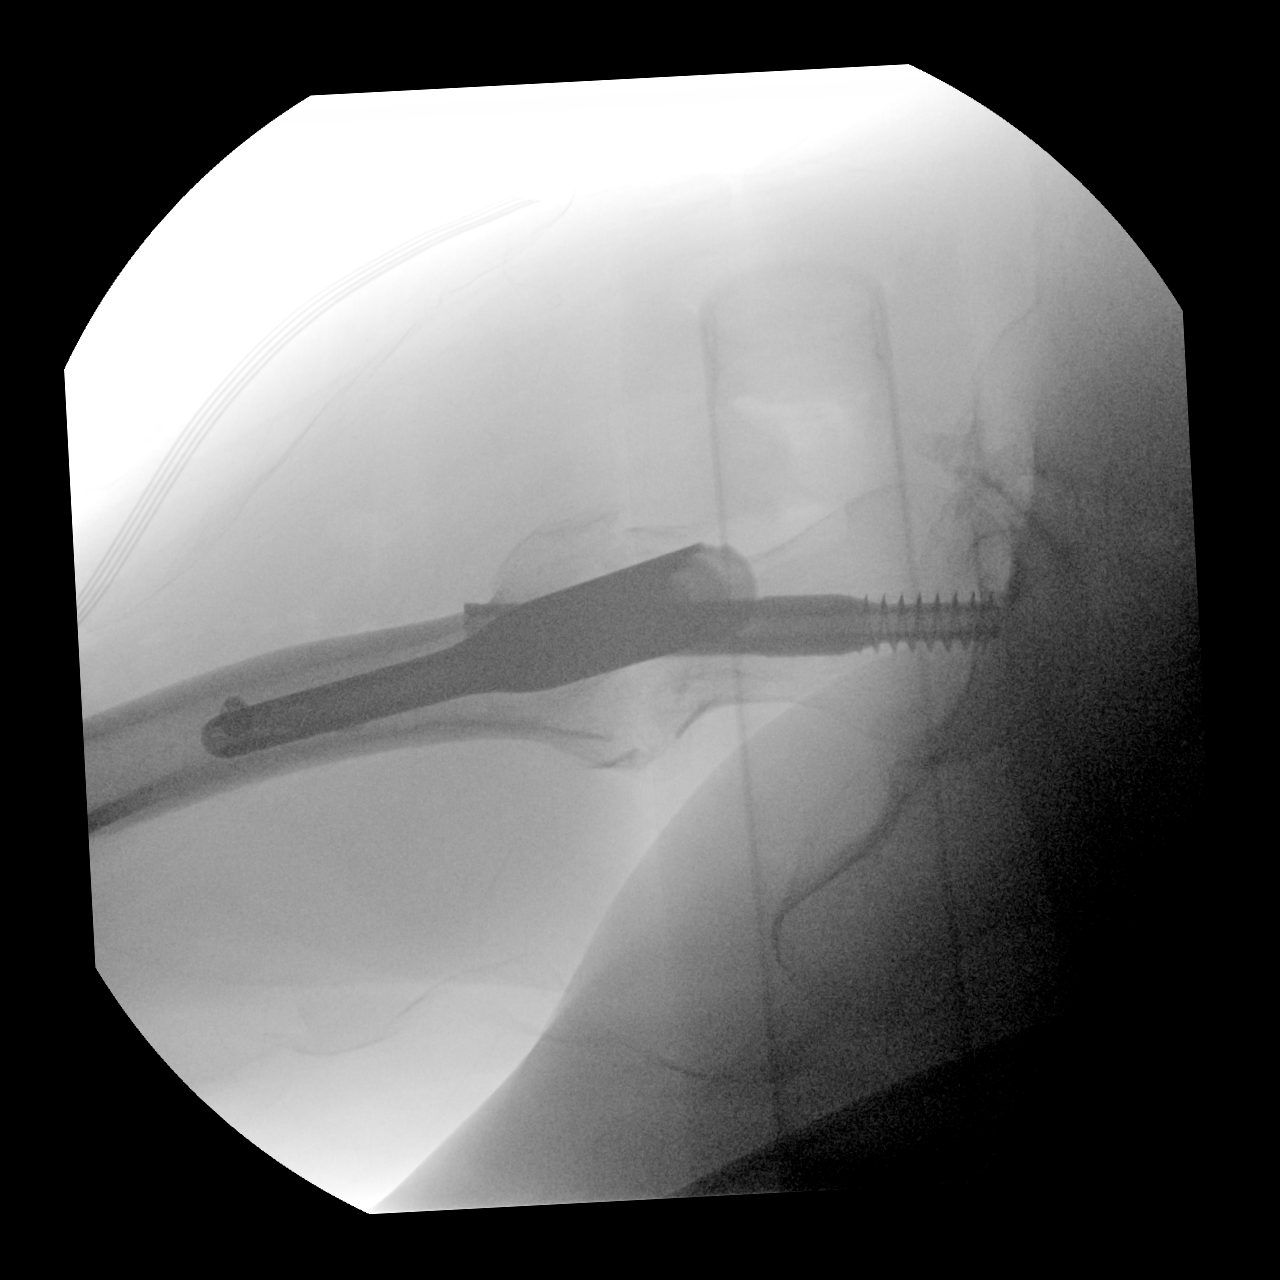

[2 of 2 positions shown; findings below may reference images not displayed]

FINDINGS: Images were performed intraoperatively without the presence of a
radiologist.

Total fluoroscopy images: 2

Total dose: 5.23 mGy

Total fluoroscopy time: 34 seconds

The patient appears to be undergoing cephalomedullary nail fixation
of the prior intertrochanteric fracture.

Please see intraoperative findings for further detail.
IMPRESSION: Cephalomedullary nail fixation of prior intertrochanteric fracture.

## 2021-07-09 SURGERY — FIXATION, FRACTURE, INTERTROCHANTERIC, WITH INTRAMEDULLARY ROD
Anesthesia: General | Laterality: Right

## 2021-07-09 MED ORDER — ACETAMINOPHEN 325 MG PO TABS: 325.0000 mg | ORAL_TABLET | Freq: Once | ORAL | Status: DC | PRN

## 2021-07-09 MED ORDER — FENTANYL CITRATE (PF) 100 MCG/2ML IJ SOLN
INTRAMUSCULAR | Status: DC | PRN
  Administered 2021-07-09: 50 ug via INTRAVENOUS

## 2021-07-09 MED ORDER — TRANEXAMIC ACID-NACL 1000-0.7 MG/100ML-% IV SOLN
INTRAVENOUS | Status: AC
  Filled 2021-07-09: qty 100

## 2021-07-09 MED ORDER — PROPOFOL 10 MG/ML IV BOLUS
INTRAVENOUS | Status: AC
  Filled 2021-07-09: qty 20

## 2021-07-09 MED ORDER — CHLORHEXIDINE GLUCONATE 0.12 % MT SOLN
15.0000 mL | Freq: Once | OROMUCOSAL | Status: DC
Start: 2021-07-09 — End: 2021-07-09

## 2021-07-09 MED ORDER — CALCIUM CARBONATE ANTACID 500 MG PO CHEW
1.0000 | CHEWABLE_TABLET | Freq: Once | ORAL | Status: AC
  Administered 2021-07-09: 200 mg via ORAL
  Filled 2021-07-09: qty 1

## 2021-07-09 MED ORDER — PHENYLEPHRINE HCL (PRESSORS) 10 MG/ML IV SOLN
INTRAVENOUS | Status: DC | PRN
  Administered 2021-07-09 (×2): 120 ug via INTRAVENOUS

## 2021-07-09 MED ORDER — ONDANSETRON HCL 4 MG/2ML IJ SOLN
INTRAMUSCULAR | Status: DC | PRN
Start: 2021-07-09 — End: 2021-07-09
  Administered 2021-07-09: 4 mg via INTRAVENOUS

## 2021-07-09 MED ORDER — MEPERIDINE HCL 50 MG/ML IJ SOLN: 6.2500 mg | INTRAMUSCULAR | Status: DC | PRN

## 2021-07-09 MED ORDER — MIDAZOLAM HCL 5 MG/5ML IJ SOLN
INTRAMUSCULAR | Status: DC | PRN
Start: 2021-07-09 — End: 2021-07-09
  Administered 2021-07-09: 1 mg via INTRAVENOUS

## 2021-07-09 MED ORDER — ACETAMINOPHEN 160 MG/5ML PO SOLN: 325.0000 mg | Freq: Once | ORAL | Status: DC | PRN

## 2021-07-09 MED ORDER — METOCLOPRAMIDE HCL 5 MG/ML IJ SOLN: 5.0000 mg | Freq: Three times a day (TID) | INTRAMUSCULAR | Status: DC | PRN

## 2021-07-09 MED ORDER — ONDANSETRON HCL 4 MG PO TABS: 4.0000 mg | ORAL_TABLET | Freq: Four times a day (QID) | ORAL | Status: DC | PRN

## 2021-07-09 MED ORDER — AMISULPRIDE (ANTIEMETIC) 5 MG/2ML IV SOLN: 10.0000 mg | Freq: Once | INTRAVENOUS | Status: DC | PRN

## 2021-07-09 MED ORDER — METOCLOPRAMIDE HCL 5 MG PO TABS: 5.0000 mg | ORAL_TABLET | Freq: Three times a day (TID) | ORAL | Status: DC | PRN

## 2021-07-09 MED ORDER — PHENYLEPHRINE HCL-NACL 20-0.9 MG/250ML-% IV SOLN
INTRAVENOUS | Status: DC | PRN
  Administered 2021-07-09: 40 ug/min via INTRAVENOUS

## 2021-07-09 MED ORDER — SENNA 8.6 MG PO TABS
1.0000 | ORAL_TABLET | Freq: Two times a day (BID) | ORAL | Status: DC
  Administered 2021-07-09 – 2021-07-12 (×6): 8.6 mg via ORAL
  Filled 2021-07-09 (×6): qty 1

## 2021-07-09 MED ORDER — PHENOL 1.4 % MT LIQD
1.0000 | OROMUCOSAL | Status: DC | PRN
  Filled 2021-07-09: qty 177

## 2021-07-09 MED ORDER — MENTHOL 3 MG MT LOZG: 1.0000 | LOZENGE | OROMUCOSAL | Status: DC | PRN

## 2021-07-09 MED ORDER — ENOXAPARIN SODIUM 30 MG/0.3ML IJ SOSY
30.0000 mg | PREFILLED_SYRINGE | INTRAMUSCULAR | Status: DC
  Administered 2021-07-10 – 2021-07-12 (×3): 30 mg via SUBCUTANEOUS
  Filled 2021-07-09 (×3): qty 0.3

## 2021-07-09 MED ORDER — TRANEXAMIC ACID-NACL 1000-0.7 MG/100ML-% IV SOLN
INTRAVENOUS | Status: DC | PRN
  Administered 2021-07-09: 1000 mg via INTRAVENOUS

## 2021-07-09 MED ORDER — PROPOFOL 10 MG/ML IV BOLUS
INTRAVENOUS | Status: DC | PRN
  Administered 2021-07-09: 90 mg via INTRAVENOUS

## 2021-07-09 MED ORDER — MIDAZOLAM HCL 2 MG/2ML IJ SOLN
INTRAMUSCULAR | Status: AC
  Filled 2021-07-09: qty 2

## 2021-07-09 MED ORDER — DOCUSATE SODIUM 100 MG PO CAPS
100.0000 mg | ORAL_CAPSULE | Freq: Two times a day (BID) | ORAL | Status: DC
  Administered 2021-07-09 – 2021-07-12 (×6): 100 mg via ORAL
  Filled 2021-07-09 (×6): qty 1

## 2021-07-09 MED ORDER — LACTATED RINGERS IV SOLN: INTRAVENOUS | Status: DC

## 2021-07-09 MED ORDER — ONDANSETRON HCL 4 MG/2ML IJ SOLN: 4.0000 mg | Freq: Four times a day (QID) | INTRAMUSCULAR | Status: DC | PRN

## 2021-07-09 MED ORDER — SUGAMMADEX SODIUM 200 MG/2ML IV SOLN
INTRAVENOUS | Status: DC | PRN
  Administered 2021-07-09: 200 mg via INTRAVENOUS

## 2021-07-09 MED ORDER — TRANEXAMIC ACID-NACL 1000-0.7 MG/100ML-% IV SOLN
1000.0000 mg | Freq: Once | INTRAVENOUS | Status: AC
  Administered 2021-07-09: 21:00:00 1000 mg via INTRAVENOUS
  Filled 2021-07-09: qty 100

## 2021-07-09 MED ORDER — PHENYLEPHRINE HCL (PRESSORS) 10 MG/ML IV SOLN
INTRAVENOUS | Status: AC
  Filled 2021-07-09: qty 1

## 2021-07-09 MED ORDER — ROCURONIUM BROMIDE 10 MG/ML (PF) SYRINGE
PREFILLED_SYRINGE | INTRAVENOUS | Status: DC | PRN
  Administered 2021-07-09: 50 mg via INTRAVENOUS

## 2021-07-09 MED ORDER — HYDROMORPHONE HCL 1 MG/ML IJ SOLN: 0.2500 mg | INTRAMUSCULAR | Status: DC | PRN

## 2021-07-09 MED ORDER — ENSURE ENLIVE PO LIQD
237.0000 mL | Freq: Two times a day (BID) | ORAL | Status: DC
  Administered 2021-07-11 – 2021-07-12 (×3): 237 mL via ORAL

## 2021-07-09 MED ORDER — PROPOFOL 1000 MG/100ML IV EMUL
INTRAVENOUS | Status: AC
  Filled 2021-07-09: qty 100

## 2021-07-09 MED ORDER — SODIUM CHLORIDE 0.9 % IR SOLN
Status: DC | PRN
  Administered 2021-07-09: 1000 mL

## 2021-07-09 MED ORDER — CEFAZOLIN SODIUM-DEXTROSE 2-4 GM/100ML-% IV SOLN
2.0000 g | Freq: Four times a day (QID) | INTRAVENOUS | Status: AC
  Administered 2021-07-10 (×2): 2 g via INTRAVENOUS
  Filled 2021-07-09 (×2): qty 100

## 2021-07-09 MED ORDER — LIDOCAINE 2% (20 MG/ML) 5 ML SYRINGE
INTRAMUSCULAR | Status: DC | PRN
  Administered 2021-07-09: 40 mg via INTRAVENOUS

## 2021-07-09 MED ORDER — ADULT MULTIVITAMIN W/MINERALS CH
1.0000 | ORAL_TABLET | Freq: Every day | ORAL | Status: DC
  Administered 2021-07-10 – 2021-07-12 (×3): 1 via ORAL
  Filled 2021-07-09 (×3): qty 1

## 2021-07-09 MED ORDER — FENTANYL CITRATE (PF) 100 MCG/2ML IJ SOLN
INTRAMUSCULAR | Status: AC
  Filled 2021-07-09: qty 2

## 2021-07-09 MED ORDER — ACETAMINOPHEN 10 MG/ML IV SOLN: 1000.0000 mg | Freq: Once | INTRAVENOUS | Status: DC | PRN

## 2021-07-09 MED ORDER — POLYETHYLENE GLYCOL 3350 17 G PO PACK: 17.0000 g | PACK | Freq: Every day | ORAL | Status: DC | PRN

## 2021-07-09 SURGICAL SUPPLY — 43 items
ADH SKN CLS APL DERMABOND .7 (GAUZE/BANDAGES/DRESSINGS) ×1
APL PRP STRL LF DISP 70% ISPRP (MISCELLANEOUS) ×1
BAG COUNTER SPONGE SURGICOUNT (BAG) IMPLANT
BAG SPEC THK2 15X12 ZIP CLS (MISCELLANEOUS)
BAG SPNG CNTER NS LX DISP (BAG)
BAG SURGICOUNT SPONGE COUNTING (BAG)
BAG ZIPLOCK 12X15 (MISCELLANEOUS) IMPLANT
CHLORAPREP W/TINT 26 (MISCELLANEOUS) ×3 IMPLANT
COVER PERINEAL POST (MISCELLANEOUS) ×3 IMPLANT
COVER SURGICAL LIGHT HANDLE (MISCELLANEOUS) ×3 IMPLANT
DERMABOND ADVANCED (GAUZE/BANDAGES/DRESSINGS) ×2
DERMABOND ADVANCED .7 DNX12 (GAUZE/BANDAGES/DRESSINGS) ×2 IMPLANT
DRAPE C-ARM 42X120 X-RAY (DRAPES) ×3 IMPLANT
DRAPE C-ARMOR (DRAPES) ×3 IMPLANT
DRAPE IMP U-DRAPE 54X76 (DRAPES) ×6 IMPLANT
DRAPE SHEET LG 3/4 BI-LAMINATE (DRAPES) ×6 IMPLANT
DRAPE STERI IOBAN 125X83 (DRAPES) ×3 IMPLANT
DRAPE U-SHAPE 47X51 STRL (DRAPES) ×6 IMPLANT
DRESSING MEPILEX FLEX 4X4 (GAUZE/BANDAGES/DRESSINGS) ×2 IMPLANT
DRSG MEPILEX BORDER 4X8 (GAUZE/BANDAGES/DRESSINGS) IMPLANT
DRSG MEPILEX FLEX 4X4 (GAUZE/BANDAGES/DRESSINGS) ×3
ELECT BLADE TIP CTD 4 INCH (ELECTRODE) IMPLANT
FACESHIELD WRAPAROUND (MASK) ×6 IMPLANT
FACESHIELD WRAPAROUND OR TEAM (MASK) ×2 IMPLANT
GAUZE SPONGE 4X4 12PLY STRL (GAUZE/BANDAGES/DRESSINGS) ×3 IMPLANT
GLOVE SRG 8 PF TXTR STRL LF DI (GLOVE) ×1 IMPLANT
GLOVE SURG ENC MOIS LTX SZ8.5 (GLOVE) ×6 IMPLANT
GLOVE SURG ENC TEXT LTX SZ7.5 (GLOVE) ×6 IMPLANT
GLOVE SURG UNDER POLY LF SZ8 (GLOVE) ×3
GLOVE SURG UNDER POLY LF SZ8.5 (GLOVE) ×3 IMPLANT
GOWN SPEC L3 XXLG W/TWL (GOWN DISPOSABLE) ×3 IMPLANT
GUIDEPIN VERSANAIL DSP 3.2X444 (ORTHOPEDIC DISPOSABLE SUPPLIES) ×2 IMPLANT
HFN 125 DEG 9MM X 180MM (Nail) ×2 IMPLANT
HIP FRA NAIL LAG SCREW 10.5X90 (Orthopedic Implant) ×3 IMPLANT
KIT BASIN OR (CUSTOM PROCEDURE TRAY) ×3 IMPLANT
MANIFOLD NEPTUNE II (INSTRUMENTS) ×3 IMPLANT
MARKER SKIN DUAL TIP RULER LAB (MISCELLANEOUS) ×3 IMPLANT
PACK GENERAL/GYN (CUSTOM PROCEDURE TRAY) ×3 IMPLANT
SCREW BONE CORTICAL 5.0X30 (Screw) ×2 IMPLANT
SCREW LAG HIP FRA NAIL 10.5X90 (Orthopedic Implant) IMPLANT
SUT MNCRL AB 3-0 PS2 18 (SUTURE) ×3 IMPLANT
SUT VIC AB 1 CT1 36 (SUTURE) ×3 IMPLANT
TOWEL OR 17X26 10 PK STRL BLUE (TOWEL DISPOSABLE) ×3 IMPLANT

## 2021-07-09 NOTE — Hospital Course (Addendum)
66 y.o. 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  Portable chest radiograph with no acute cardiopulmonary pathology.  Right femur and pelvic x-ray showed right femur intertrochanteric fracture.  Thoracic spine show a new endplate compression deformity of T3 which is new since 04/15/2021.  She has also showed demineralization with dextroconvex thoracic scoliosis.  No acute findings on lumbosacral spine. Orthopedic was consulted and admitted.  At baseline patient has right-sided upper extremity weakness contracture but fairly ambulatory.Patient was taken to the OR 3/9 underwent operative intervention by Dr. Lyla Glassing.  Postop patient did fairly well needing pain medication appeared sleepy possible seizure as she was found to have" spaced out" but no postictal.personally discussed with Dr. Rory Percy 3/10 evening and advised valproate level  and is stable, ammonia level slightly high and if she has recurrent episode will need EEG but no recurrence since stopping opiates.  PT OT advised  SNF,at this time she is medically stable for discharge to SNF-back to the facility.  ? ?

## 2021-07-09 NOTE — Assessment & Plan Note (Signed)
Resolved

## 2021-07-09 NOTE — Progress Notes (Signed)
Pt returned to floor mid shift change alert . ?

## 2021-07-09 NOTE — Progress Notes (Signed)
Patient experienced chest pain in PACU, an EKG was performed and showed Sinus tach with no ST elevation. Patient does not present with any other symptoms. Hollis MD was made aware. MD cleared patient to return to her prior unit [facility]. [place] NP was notified of the situation. This event was reported to the floor nurse Melissa RN and Chemical engineer. Will continue to monitor patient.  ?

## 2021-07-09 NOTE — Addendum Note (Signed)
Addendum  created 07/09/21 2001 by Effie Berkshire, MD  ? SmartForm saved  ?  ?

## 2021-07-09 NOTE — Transfer of Care (Signed)
Immediate Anesthesia Transfer of Care Note ? ?Patient: Tonya Thompson ? ?Procedure(s) Performed: INTRAMEDULLARY (IM) NAIL INTERTROCHANTRIC (Right) ? ?Patient Location: PACU ? ?Anesthesia Type:General ? ?Level of Consciousness: sedated, patient cooperative and responds to stimulation ? ?Airway & Oxygen Therapy: Patient Spontanous Breathing ? ?Post-op Assessment: Report given to RN and Post -op Vital signs reviewed and stable ? ?Post vital signs: Reviewed and stable ? ?Last Vitals:  ?Vitals Value Taken Time  ?BP 128/87 07/09/21 1815  ?Temp    ?Pulse 106 07/09/21 1816  ?Resp 11 07/09/21 1816  ?SpO2 100 % 07/09/21 1816  ?Vitals shown include unvalidated device data. ? ?Last Pain:  ?Vitals:  ? 07/09/21 1600  ?TempSrc:   ?PainSc: 9   ?   ? ?Patients Stated Pain Goal: 0 (07/09/21 0426) ? ?Complications: No notable events documented. ?

## 2021-07-09 NOTE — Assessment & Plan Note (Signed)
Stable ldl ?

## 2021-07-09 NOTE — Progress Notes (Signed)
Initial Nutrition Assessment ? ?DOCUMENTATION CODES:  ? ?Non-severe (moderate) malnutrition in context of chronic illness ? ?INTERVENTION:  ? ?Once diet advanced: ?-Ensure Plus High Protein po BID, each supplement provides 350 kcal and 20 grams of protein.  ?-Multivitamin with minerals daily ? ?NUTRITION DIAGNOSIS:  ? ?Moderate Malnutrition related to chronic illness (h/o multiple CVAs, right hemiparesis) as evidenced by moderate fat depletion, moderate muscle depletion. ? ?GOAL:  ? ?Patient will meet greater than or equal to 90% of their needs ? ?MONITOR:  ? ?Diet advancement, Labs, Weight trends, I & O's ? ?REASON FOR ASSESSMENT:  ? ?Consult ?Hip fracture protocol ? ?ASSESSMENT:  ? ?66 y.o. 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 ? ?Pt in room, no visitors at bedside.  ?Pt in visible pain, winces at the slightest movement. Pt HOH so history was difficult to obtain. Pt currently NPO, awaiting surgery this afternoon. ?Pt states she hasn't been eating that well. Pt with h/o CVAs and with right hemiparesis, unable to use her arm. Left arm shaking when holding up paper during visit. Likely will need feeding assistance. ?Pt states she would like Ensure supplements after surgery, requests only vanilla or strawberry. Avoids chocolate, cheese, nuts, red wine and coffee d/t migraines.  ? ?Reports UBW of 152 lbs.  ?Current weight: 144 lbs. Unsure of time frame. ? ?Pt takes the following supplements at home: Vitamin D, omega 3, MVI, and calcium ? ?Medications reviewed. ? ?Labs reviewed.  ? ?NUTRITION - FOCUSED PHYSICAL EXAM: ? ?Flowsheet Row Most Recent Value   ?Orbital Region Mild depletion  ?Upper Arm Region Severe depletion  ?Thoracic and Lumbar Region Mild depletion  ?Buccal Region Mild depletion  ?Temple Region Moderate depletion  ?Clavicle Bone Region Mild depletion  ?Clavicle and Acromion Bone Region Mild depletion  ?Scapular Bone Region Severe depletion  ?Dorsal Hand Mild depletion  ?Patellar Region Unable to assess  [states she is in pain, could not lift legs]  ?Anterior Thigh Region Unable to assess  ?Posterior Calf Region Unable to assess  ?Edema (RD Assessment) Mild  [BLEs]  ?Hair Reviewed  ?Eyes Reviewed  ?Mouth Reviewed  [poor dentition, dry mouth]  ?Skin Reviewed  ? ?  ? ? ?Diet Order:   ?Diet Order   ? ?       ?  Diet NPO time specified  Diet effective midnight       ?  ? ?  ?  ? ?  ? ? ?EDUCATION NEEDS:  ? ?No education needs have been identified at this time ? ?Skin:  Skin Assessment: Reviewed RN Assessment ? ?Last BM:  3/7 ? ?Height:  ? ?Ht Readings from Last 1 Encounters:  ?07/08/21 5' 5.5" (1.664 m)  ? ? ?Weight:  ? ?Wt Readings from Last 1 Encounters:  ?07/08/21 65.3 kg  ? ? ?BMI:  Body mass index is 23.6 kg/m?. ? ?Estimated Nutritional Needs:  ? ?Kcal:  1650-1850 ? ?Protein:  75-85g ? ?Fluid:  1.8L/day ? ? ?Clayton Bibles, MS, RD, LDN ?Inpatient Clinical Dietitian ?Contact information available via Amion ? ?

## 2021-07-09 NOTE — Anesthesia Postprocedure Evaluation (Signed)
Anesthesia Post Note ? ?Patient: Tonya Thompson ? ?Procedure(s) Performed: INTRAMEDULLARY (IM) NAIL INTERTROCHANTRIC (Right) ? ?  ? ?Patient location during evaluation: PACU ?Anesthesia Type: General ?Level of consciousness: awake and alert ?Pain management: pain level controlled ?Vital Signs Assessment: post-procedure vital signs reviewed and stable ?Respiratory status: spontaneous breathing, nonlabored ventilation, respiratory function stable and patient connected to nasal cannula oxygen ?Cardiovascular status: blood pressure returned to baseline and stable ?Postop Assessment: no apparent nausea or vomiting ?Anesthetic complications: no ?Comments: Reproducible diffuse waxing and waning chest pain in PACU. EKG ordered in PACU by RN. Non-specific EKG changes likely secondary to tachycardia. Will continue to monitor, considering beta blocker if worsens, hospitalist notified by RN. ? ? ?No notable events documented. ? ?Last Vitals:  ?Vitals:  ? 07/09/21 1845 07/09/21 1953  ?BP: (!) 118/93 123/85  ?Pulse: (!) 102 (!) 112  ?Resp: 14 16  ?Temp: 36.8 ?C 36.8 ?C  ?SpO2: 95% 97%  ?  ?Last Pain:  ?Vitals:  ? 07/09/21 1845  ?TempSrc:   ?PainSc: 0-No pain  ? ? ?  ?  ?  ?  ?  ?  ? ?Effie Berkshire ? ? ? ? ?

## 2021-07-09 NOTE — Plan of Care (Addendum)
For surgery 07/09/21 R hip intertrochanteric fracture repair by Dr Lyla Glassing at 4:15pm  ?

## 2021-07-09 NOTE — Anesthesia Preprocedure Evaluation (Addendum)
Anesthesia Evaluation  ?Patient identified by MRN, date of birth, ID band ?Patient awake ? ? ? ?Reviewed: ?Allergy & Precautions, NPO status , Patient's Chart, lab work & pertinent test results ? ?Airway ?Mallampati: III ? ? ?Neck ROM: Full ? ? ? Dental ? ?(+) Teeth Intact, Dental Advisory Given ?  ?Pulmonary ?neg pulmonary ROS,  ?  ?breath sounds clear to auscultation ? ? ? ? ? ? Cardiovascular ?hypertension, + CAD  ? ?Rhythm:Regular Rate:Tachycardia ? ? ?  ?Neuro/Psych ? Headaches, Seizures -, Well Controlled,  CVA, Residual Symptoms negative psych ROS  ? GI/Hepatic ?Neg liver ROS, GERD  ,  ?Endo/Other  ?negative endocrine ROS ? Renal/GU ?Renal disease  ? ?  ?Musculoskeletal ? ?(+) Arthritis ,  ? Abdominal ?Normal abdominal exam  (+)   ?Peds ? Hematology ?negative hematology ROS ?(+)   ?Anesthesia Other Findings ?R sided residual weakness from CVA.  ? Reproductive/Obstetrics ? ?  ? ? ? ? ? ? ? ? ? ? ? ? ? ?  ?  ? ? ? ? ? ? ? ?Anesthesia Physical ?Anesthesia Plan ? ?ASA: 3 ? ?Anesthesia Plan: General  ? ?Post-op Pain Management:   ? ?Induction: Intravenous ? ?PONV Risk Score and Plan: 3 and Ondansetron, Propofol infusion and Treatment may vary due to age or medical condition ? ?Airway Management Planned: Oral ETT ? ?Additional Equipment: None ? ?Intra-op Plan:  ? ?Post-operative Plan: Extubation in OR ? ?Informed Consent: I have reviewed the patients History and Physical, chart, labs and discussed the procedure including the risks, benefits and alternatives for the proposed anesthesia with the patient or authorized representative who has indicated his/her understanding and acceptance.  ? ? ? ? ? ?Plan Discussed with: CRNA ? ?Anesthesia Plan Comments: (Ms. Moffitt has elevated aspiration risk due to residual symptoms from CVA.  ? ?Lab Results ?     Component                Value               Date                 ?     WBC                      9.0                 07/09/2021            ?     HGB                      14.5                07/09/2021           ?     HCT                      42.5                07/09/2021           ?     MCV                      105.7 (H)           07/09/2021           ?     PLT  91 (L)              07/09/2021           ?)  ? ? ? ? ? ?Anesthesia Quick Evaluation ? ?

## 2021-07-09 NOTE — Assessment & Plan Note (Addendum)
2/2 fall.S/P operative intervention 3/9 by Dr. Lyla Glassing. ?Postop doing well.  Pain is controlled.  Patient on Lovenox for DVT prophylaxis and pain control as per ortho. Cont Aquacel dressing and follow-up with orthopedics as outpatient.  Monitor CBC closely while on Lovenox given thrombocytopenia. ? ?

## 2021-07-09 NOTE — Progress Notes (Signed)
PROGRESS NOTE Tonya Thompson  FVC:[id number] DOB: [birth year] DOA: 07/08/2021 PCP: Unk Pinto, MD   Brief Narrative/Hospital Course: 66 y.o. 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  Portable chest radiograph with no acute cardiopulmonary pathology.  Right femur and pelvic x-ray showed right femur intertrochanteric fracture.  Thoracic spine show a new endplate compression deformity of T3 which is new since 04/15/2021.  She has also showed demineralization with dextroconvex thoracic scoliosis.  No acute findings on lumbosacral spine. Orthopedic was consulted and admitted.  At baseline patient has right-sided upper extremity weakness contracture but fairly ambulatory   Subjective: seen and examined this am. C/O pain right femur. Has right side/right upper extremity spastic bladder lysis. He is hard of hearing, not in acute distress no complaint.  Assessment and Plan: * Closed intertrochanteric fracture of right femur (Progress) 2/2 fall.Orthopedics consulted for operative intervention.Patient has known history of multiple CVAs,has CKD and is going for intermediate risk surgery,understanding risk benefits as per orthopedics.Monitor closely perioperatively.TTE report came back with preserved EF 55 to 60%, G1 DD, no RWMA MV grossly normal aortic valve tricuspid.  EKG no ischemia.  No further preoperative cardiac work-up indicated   Coronary artery calcification No acute finding on echo,,ekg non ischemic.  History of AVM in the brain and not on antiplatelets  Acute renal failure superimposed on stage 3b chronic  kidney disease (HCC) Creat at baseline. Monitor Recent Labs  Lab 07/08/21 1052 07/09/21 0456  BUN 26* 29*  CREATININE 1.32* 1.47*    Right hemiparesis (HCC) History of multiple CVAs: LDL 85.  Not on antiplatelets due to thrombocytopenia/ and AVMs  Thrombocytopenia (HCC) Platelet 98 K.  Monitor was in normal range in December 2022  Compression fracture of T3 vertebra (HCC) Osteopenia: Xray Incidentally showed endplate compression deformity of T3 with demineralization, currently no complaint of pain.continue vitamin D calcium bisphosphonate home dose pcp f/u  Abnormal LFTs Resolved.  Macrocytosis B12 and folate are normal.  Monitor  Mild protein malnutrition (HCC) Augment nutritional status  Osteopenia See above  Hyperlipidemia, mixed Stable ldl  Epilepsy, grand mal ([place]) History of seizure disorder on Depakote and Topamax, continue same.  DVT prophylaxis: SCDs Start: 07/08/21 1354 Code Status:   Code Status: Full Code Family Communication: plan of care discussed with patient at bedside.  Disposition: Currently not medically stable for discharge. Status is: Inpatient Remains inpatient appropriate because: For ongoing intervention of hip fracture Objective: Vitals last 24 hrs: Vitals:   07/08/21 1510 07/08/21 1927 07/09/21 0001 07/09/21 0331  BP: 107/84 117/77 135/87 (!) 143/89  Pulse: 81 81 97 98  Resp: '20 16 17 17  '$ Temp: 97.9 F (36.6 C) 97.6 F (36.4 C)    TempSrc: Oral Oral    SpO2: 96% 97% 100% 99%  Weight:      Height:       Weight change:   Physical Examination: General exam: AA, hard of hearing, older than stated age, weak appearing. HEENT:Oral mucosa moist, Ear/Nose WNL grossly, dentition normal. Respiratory system: bilaterally diminished BS, no use of accessory muscle Cardiovascular system: S1 & S2 +, No JVD,. Gastrointestinal system: Abdomen soft,NT,ND, BS+ Nervous System:Alert, awake,  right upper extremity spastic palsy.   Extremities: LE  edema none,distal peripheral pulses palpable.  Skin: No rashes,no icterus. MSK: Normal muscle bulk,tone, power  Medications reviewed:  Scheduled Meds:  acetaminophen  1,000 mg Oral Once   chlorhexidine  60 mL Topical Once   divalproex  1,000 mg Oral QHS   mupirocin ointment  1 application. Nasal BID   povidone-iodine  2 application. Topical Once   topiramate  300 mg Oral BID   Continuous Infusions:  sodium chloride 50 mL/hr at 07/08/21 1652    ceFAZolin (ANCEF) IV     tranexamic acid        Diet Order             Diet NPO time specified  Diet effective midnight                    Nutrition Problem: Moderate Malnutrition Etiology: chronic illness (h/o multiple CVAs, right hemiparesis) Signs/Symptoms: moderate fat depletion, moderate muscle depletion     Intake/Output Summary (Last 24 hours) at 07/09/2021 1148 Last data filed at 07/09/2021 0843 Gross per 24 hour  Intake 25.35 ml  Output --  Net 25.35 ml   Net IO Since Admission: 25.35 mL [07/09/21 1148]  Wt Readings from Last 3 Encounters:  07/08/21 65.3 kg  06/17/21 67 kg  05/22/21 67.3 kg     Unresulted Labs (From admission, onward)    None     Data Reviewed: I have personally reviewed following labs and imaging studies CBC: Recent Labs  Lab 07/08/21 1052 07/09/21 0456  WBC 6.5 9.0  NEUTROABS 5.0  --   HGB 15.3* 14.5  HCT 45.3 42.5  MCV 106.3* 105.7*  PLT 93* 91*   Basic Metabolic Panel: Recent Labs  Lab 07/08/21 1052 07/09/21 0456  NA 139 137  K 3.9 3.8  CL 106 109  CO2 27 20*  GLUCOSE 99 153*  BUN 26* 29*  CREATININE 1.32* 1.47*  CALCIUM 9.4 8.9  MG 2.4  --   PHOS 4.2  --    GFR: Estimated Creatinine Clearance: 36 mL/min (A) (by C-G formula based on SCr of 1.47 mg/dL (H)). Liver Function Tests: Recent Labs  Lab 07/08/21 1052 07/09/21 0456  AST 57* 41  ALT 34 28  ALKPHOS 80 63  BILITOT 0.7 0.6  PROT 6.5 5.6*  ALBUMIN 3.3* 2.8*   No results for input(s): LIPASE, AMYLASE  in the last 168 hours. No results for input(s): AMMONIA in the last 168 hours. Coagulation Profile: No results for input(s): INR, PROTIME in the last 168 hours. Cardiac Enzymes: No results for input(s): CKTOTAL, CKMB, CKMBINDEX, TROPONINI in the last 168 hours. BNP (last 3 results) No results for input(s): PROBNP in the last 8760 hours. HbA1C: No results for input(s): HGBA1C in the last 72 hours. CBG: No results for input(s): GLUCAP in the last 168 hours. Lipid Profile: Recent Labs    07/09/21 0456  CHOL 158  HDL 58  LDLCALC 85  TRIG 74  CHOLHDL 2.7   Thyroid Function Tests: No results for input(s): TSH, T4TOTAL, FREET4, T3FREE, THYROIDAB in the last 72 hours. Anemia Panel: Recent Labs    07/09/21 0456  VITAMINB12 853  FOLATE 45.2   Sepsis Labs: No results for input(s): PROCALCITON, LATICACIDVEN in the last 168 hours.  Recent Results (from the past 240 hour(s))  Resp Panel by RT-PCR (Flu A&B, Covid) Nasopharyngeal Swab     Status: None   Collection Time: 07/08/21 11:26 AM  Specimen: Nasopharyngeal Swab; Nasopharyngeal(NP) swabs in vial transport medium  Result Value Ref Range Status   SARS Coronavirus 2 by RT PCR NEGATIVE NEGATIVE Final    Comment: (NOTE) SARS-CoV-2 target nucleic acids are NOT DETECTED.  The SARS-CoV-2 RNA is generally detectable in upper respiratory specimens during the acute phase of infection. The lowest concentration of SARS-CoV-2 viral copies this assay can detect is 138 copies/mL. A negative result does not preclude SARS-Cov-2 infection and should not be used as the sole basis for treatment or other patient management decisions. A negative result may occur with  improper specimen collection/handling, submission of specimen other than nasopharyngeal swab, presence of viral mutation(s) within the areas targeted by this assay, and inadequate number of viral copies(<138 copies/mL). A negative result must be combined with clinical observations,  patient history, and epidemiological information. The expected result is Negative.  Fact Sheet for Patients:  EntrepreneurPulse.com.au  Fact Sheet for Healthcare Providers:  IncredibleEmployment.be  This test is no t yet approved or cleared by the [place] FDA and  has been authorized for detection and/or diagnosis of SARS-CoV-2 by FDA under an Emergency Use Authorization (EUA). This EUA will remain  in effect (meaning this test can be used) for the duration of the COVID-19 declaration under Section 564(b)(1) of the Act, 21 U.S.C.section 360bbb-3(b)(1), unless the authorization is terminated  or revoked sooner.       Influenza A by PCR NEGATIVE NEGATIVE Final   Influenza B by PCR NEGATIVE NEGATIVE Final    Comment: (NOTE) The Xpert Xpress SARS-CoV-2/FLU/RSV plus assay is intended as an aid in the diagnosis of influenza from Nasopharyngeal swab specimens and should not be used as a sole basis for treatment. Nasal washings and aspirates are unacceptable for Xpert Xpress SARS-CoV-2/FLU/RSV testing.  Fact Sheet for Patients: EntrepreneurPulse.com.au  Fact Sheet for Healthcare Providers: IncredibleEmployment.be  This test is not yet approved or cleared by the [place] FDA and has been authorized for detection and/or diagnosis of SARS-CoV-2 by FDA under an Emergency Use Authorization (EUA). This EUA will remain in effect (meaning this test can be used) for the duration of the COVID-19 declaration under Section 564(b)(1) of the Act, 21 U.S.C. section 360bbb-3(b)(1), unless the authorization is terminated or revoked.  Performed at [facility], [place] [address], [place], [place] [address]   Surgical PCR screen     Status: None   Collection Time: 07/08/21  4:17 PM   Specimen: Nasal Mucosa; Nasal Swab  Result Value Ref Range Status   MRSA, PCR NEGATIVE NEGATIVE Final    Staphylococcus aureus NEGATIVE NEGATIVE Final    Comment: (NOTE) The Xpert SA Assay (FDA approved for NASAL specimens in patients 69 years of age and older), is one component of a comprehensive surveillance program. It is not intended to diagnose infection nor to guide or monitor treatment. Performed at [facility], [place] [address], [place], [place] [address]     Antimicrobials: Anti-infectives (From admission, onward)    Start     Dose/Rate Route Frequency Ordered Stop   07/09/21 1600  ceFAZolin (ANCEF) IVPB 2g/100 mL premix        2 g 200 mL/hr over 30 Minutes Intravenous On call to O.R. 07/08/21 2138 07/10/21 0559   07/08/21 1800  ceFAZolin (ANCEF) IVPB 2g/100 mL premix        2 g 200 mL/hr over 30 Minutes Intravenous On call to O.R. 07/08/21 1234 07/09/21 0559      Culture/Microbiology  Component Value Date/Time   SDES BRONCHIAL ALVEOLAR LAVAGE 04/21/2021 0945   SDES BRONCHIAL ALVEOLAR LAVAGE 04/21/2021 0945   SPECREQUEST RUL 04/21/2021 0945   SPECREQUEST RUL 04/21/2021 0945   CULT  04/21/2021 0945    NO GROWTH 2 DAYS Performed at [facility], [place] [address] [place], [place] [address]    CULT  04/21/2021 0945    No growth aerobically or anaerobically. Performed at [facility], [place] [address], [place], [place] [address]    REPTSTATUS 04/23/2021 FINAL 04/21/2021 0945   REPTSTATUS 04/26/2021 FINAL 04/21/2021 0945    Other culture-see note  Radiology Studies: DG Chest 1 View  Result Date: 07/08/2021 CLINICAL DATA:  [place] 3 hours ago, hip pain EXAM: CHEST  1 VIEW COMPARISON:  04/21/2021 FINDINGS: Normal heart size, mediastinal contours, and pulmonary vascularity. Lungs clear. Marker RIGHT upper lobe. No acute infiltrate, pleural effusion, or pneumothorax. Skin folds project over chest. Bones demineralized with chronic RIGHT glenohumeral degenerative changes. IMPRESSION: No acute abnormalities. Electronically Signed   By: Lavonia Dana M.D.    On: 07/08/2021 10:38   DG Thoracic Spine 2 View  Result Date: 07/08/2021 CLINICAL DATA:  [place] 3 hours ago EXAM: THORACIC SPINE 2 VIEWS COMPARISON:  None; correlation CT chest 04/15/2021 FINDINGS: Twelve pairs of ribs. Bones demineralized. Dextroconvex scoliosis lower thoracic spine. Mild superior endplate compression deformity of approximately L3 vertebral body new since 04/15/2021 Vertebral body heights otherwise maintained without additional fracture, subluxation, or bone destruction. IMPRESSION: Osseous demineralization with dextroconvex thoracic scoliosis. New mild superior endplate compression deformity of T3 vertebral body since 04/15/2021. Electronically Signed   By: Lavonia Dana M.D.   On: 07/08/2021 10:36   DG Lumbar Spine Complete  Result Date: 07/08/2021 CLINICAL DATA:  [place] 3 hours ago EXAM: LUMBAR SPINE - COMPLETE 4+ VIEW COMPARISON:  None FINDINGS: Five non-rib-bearing lumbar vertebra. Osseous demineralization. Biconvex thoracolumbar scoliosis. Multilevel disc space narrowing and endplate spur formation. No fracture, subluxation, or bone destruction. SI joints preserved. Tubal ligation clips in pelvis. BILATERAL renal calculi largest LEFT kidney 13 mm diameter. IMPRESSION: Degenerative disc disease changes and scoliosis lumbar spine. No acute osseous abnormalities. BILATERAL renal calculi. Electronically Signed   By: Lavonia Dana M.D.   On: 07/08/2021 10:34   DG Pelvis 1-2 Views  Result Date: 07/08/2021 CLINICAL DATA:  [place] 3 hours ago, hip pain EXAM: PELVIS - 1-2 VIEW COMPARISON:  None FINDINGS: Osseous demineralization. Hip and SI joint spaces preserved. Mildly distracted and angulated intertrochanteric fracture RIGHT femur. No additional fracture, dislocation, or bone destruction. Tubal ligation clips in pelvis. IMPRESSION: Mildly displaced and angulated intertrochanteric fracture RIGHT femur. Electronically Signed   By: Lavonia Dana M.D.   On: 07/08/2021 10:37   DG Knee Complete 4 Views  Right  Result Date: 07/08/2021 CLINICAL DATA:  Knee pain post fall EXAM: RIGHT KNEE - COMPLETE 4+ VIEW COMPARISON:  None FINDINGS: Osseous demineralization. Diffuse joint space narrowing. No acute fracture, dislocation, or bone destruction. No joint effusion. IMPRESSION: No acute osseous abnormalities. Electronically Signed   By: Lavonia Dana M.D.   On: 07/08/2021 10:30   ECHOCARDIOGRAM COMPLETE  Result Date: 07/09/2021    ECHOCARDIOGRAM REPORT   Patient Name:   Tonya Thompson Date of Exam: 07/09/2021 Medical Rec #:  [id number]        Height:       65.5 in Accession #:    [id number]       Weight:       144.0 lb Date  of Birth:  October 31, 1955        BSA:          1.730 m Patient Age:    32 years         BP:           117/77 mmHg Patient Gender: F                HR:           108 bpm. Exam Location:  Inpatient Procedure: 2D Echo, Color Doppler and Cardiac Doppler Indications:    Preop  History:        Patient has no prior history of Echocardiogram examinations.  Sonographer:    Jyl Heinz Referring Phys: [id number] [place] IMPRESSIONS  1. Left ventricular ejection fraction, by estimation, is 55 to 60%. The left ventricle has normal function. Left ventricular endocardial border not optimally defined to evaluate regional wall motion. There is mild concentric left ventricular hypertrophy. Left ventricular diastolic parameters are consistent with Grade I diastolic dysfunction (impaired relaxation).  2. Right ventricular systolic function was not well visualized. The right ventricular size is normal. There is normal pulmonary artery systolic pressure.  3. The mitral valve is grossly normal. No evidence of mitral valve regurgitation.  4. The aortic valve is tricuspid. Aortic valve regurgitation is not visualized. No aortic stenosis is present.  5. The inferior vena cava is normal in size with greater than 50% respiratory variability, suggesting right atrial pressure of 3 mmHg. Comparison(s): No prior Echocardiogram.  Conclusion(s)/Recommendation(s): In future studies would perform with echo contrast. FINDINGS  Left Ventricle: Left ventricular ejection fraction, by estimation, is 55 to 60%. The left ventricle has normal function. Left ventricular endocardial border not optimally defined to evaluate regional wall motion. The left ventricular internal cavity size was small. There is mild concentric left ventricular hypertrophy. Left ventricular diastolic parameters are consistent with Grade I diastolic dysfunction (impaired relaxation). Right Ventricle: The right ventricular size is normal. Right vetricular wall thickness was not well visualized. Right ventricular systolic function was not well visualized. There is normal pulmonary artery systolic pressure. The tricuspid regurgitant velocity is 2.52 m/s, and with an assumed right atrial pressure of 3 mmHg, the estimated right ventricular systolic pressure is 77.4 mmHg. Left Atrium: Left atrial size was normal in size. Right Atrium: Right atrial size was normal in size. Pericardium: Trivial pericardial effusion is present. Mitral Valve: The mitral valve is grossly normal. No evidence of mitral valve regurgitation. Tricuspid Valve: The tricuspid valve is normal in structure. Tricuspid valve regurgitation is mild . No evidence of tricuspid stenosis. Aortic Valve: The aortic valve is tricuspid. Aortic valve regurgitation is not visualized. No aortic stenosis is present. Aortic valve peak gradient measures 3.8 mmHg. Pulmonic Valve: The pulmonic valve was normal in structure. Pulmonic valve regurgitation is not visualized. No evidence of pulmonic stenosis. Aorta: The aortic root and ascending aorta are structurally normal, with no evidence of dilitation. Venous: The inferior vena cava is normal in size with greater than 50% respiratory variability, suggesting right atrial pressure of 3 mmHg. IAS/Shunts: No atrial level shunt detected by color flow Doppler.  LEFT VENTRICLE PLAX 2D LVIDd:          3.10 cm     Diastology LVIDs:         2.40 cm     LV e' medial:    3.92 cm/s LV PW:         1.20 cm     LV  E/e' medial:  12.2 LV IVS:        1.00 cm     LV e' lateral:   7.18 cm/s LVOT diam:     1.80 cm     LV E/e' lateral: 6.7 LV SV:         38 LV SV Index:   22 LVOT Area:     2.54 cm  LV Volumes (MOD) LV vol d, MOD A2C: 44.7 ml LV vol d, MOD A4C: 43.2 ml LV vol s, MOD A2C: 18.4 ml LV vol s, MOD A4C: 18.3 ml LV SV MOD A2C:     26.3 ml LV SV MOD A4C:     43.2 ml LV SV MOD BP:      25.4 ml RIGHT VENTRICLE            IVC RV S prime:     9.79 cm/s  IVC diam: 1.20 cm TAPSE (M-mode): 1.4 cm LEFT ATRIUM             Index LA diam:        1.80 cm 1.04 cm/m LA Vol (A2C):   6.8 ml  3.93 ml/m LA Vol (A4C):   6.6 ml  3.79 ml/m LA Biplane Vol: 7.0 ml  4.06 ml/m  AORTIC VALVE AV Area (Vmax): 2.53 cm AV Vmax:        97.30 cm/s AV Peak Grad:   3.8 mmHg LVOT Vmax:      96.60 cm/s LVOT Vmean:     68.500 cm/s LVOT VTI:       0.151 m  AORTA Ao Root diam: 3.10 cm Ao Asc diam:  2.80 cm MITRAL VALVE               TRICUSPID VALVE MV Area (PHT): 6.37 cm    TR Peak grad:   25.4 mmHg MV Decel Time: 119 msec    TR Vmax:        252.00 cm/s MV E velocity: 48.00 cm/s MV A velocity: 85.30 cm/s  SHUNTS MV E/A ratio:  0.56        Systemic VTI:  0.15 m                            Systemic Diam: 1.80 cm Rudean Haskell MD Electronically signed by Rudean Haskell MD Signature Date/Time: 07/09/2021/11:34:14 AM    Final    DG FEMUR, MIN 2 VIEWS RIGHT  Result Date: 07/08/2021 CLINICAL DATA:  Hip pain post fall 3 hours ago EXAM: RIGHT FEMUR 2 VIEWS COMPARISON:  08/22/2020 FINDINGS: Osseous demineralization. RIGHT hip and SI joint spaces preserved. Minimally distracted and angulated intertrochanteric fracture RIGHT femur. No dislocation. Visualized pelvis intact. IMPRESSION: Minimally distracted and angulated intertrochanteric fracture RIGHT femur. Electronically Signed   By: Lavonia Dana M.D.   On: 07/08/2021 10:31     LOS: 1 day    Antonieta Pert, MD [facility]  07/09/2021, 11:48 AM

## 2021-07-09 NOTE — Assessment & Plan Note (Addendum)
History of seizure disorder-grand mal, partial seizure on Depakote and Topamax,  3/10 evening appeared sleepy possible seizure as she was found to have" spaced out" but no postictal.personally discussed with Dr. Rory Percy with neurology 3/10 evening and advised valproate level  and is stable, ammonia level slightly high-patient is doing well no recurrence of episodes.  She will continue on home regimen.  ?

## 2021-07-09 NOTE — Progress Notes (Signed)
?   07/09/21 1953  ?Assess: MEWS Score  ?Temp 98.3 ?F (36.8 ?C)  ?BP 123/85  ?Pulse Rate (!) 112  ?Resp 16  ?Level of Consciousness Alert  ?SpO2 97 %  ?O2 Device Room Air  ?Assess: MEWS Score  ?MEWS Temp 0  ?MEWS Systolic 0  ?MEWS Pulse 2  ?MEWS RR 0  ?MEWS LOC 0  ?MEWS Score 2  ?MEWS Score Color Yellow  ?Treat  ?Pain Scale 0-10  ?Pain Score 0  ?Take Vital Signs  ?Increase Vital Sign Frequency  Yellow: Q 2hr X 2 then Q 4hr X 2, if remains yellow, continue Q 4hrs  ?Escalate  ?MEWS: Escalate Yellow: discuss with charge nurse/RN and consider discussing with provider and RRT  ?Notify: Charge Nurse/RN  ?Name of Charge Nurse/RN Notified Vera  ?Date Charge Nurse/RN Notified 07/09/21  ?Time Charge Nurse/RN Notified 2100  ?Notify: Provider  ?Provider Name/Title did not notify  ?Notify: Rapid Response  ?Name of Rapid Response RN Notified did not notify  ?Document  ?Patient Outcome Other (Comment)  ?Progress note created (see row info) Yes  ?Assess: SIRS CRITERIA  ?SIRS Temperature  0  ?SIRS Pulse 1  ?SIRS Respirations  0  ?SIRS WBC 0  ?SIRS Score Sum  1  ? ?Pt asymptomatic just returned from surgery, continue to monitor and follow MEWS yellow guidelines for increased V/S and assess for changes. ?

## 2021-07-09 NOTE — Anesthesia Procedure Notes (Signed)
Procedure Name: Intubation ?Date/Time: 07/09/2021 5:11 PM ?Performed by: Gean Maidens, CRNA ?Pre-anesthesia Checklist: Patient identified, Emergency Drugs available, Suction available, Patient being monitored and Timeout performed ?Patient Re-evaluated:Patient Re-evaluated prior to induction ?Oxygen Delivery Method: Circle system utilized ?Preoxygenation: Pre-oxygenation with 100% oxygen ?Induction Type: IV induction ?Ventilation: Mask ventilation without difficulty ?Laryngoscope Size: Mac and 3 ?Grade View: Grade I ?Tube type: Oral ?Tube size: 7.0 mm ?Number of attempts: 1 ?Airway Equipment and Method: Stylet ?Placement Confirmation: ETT inserted through vocal cords under direct vision, positive ETCO2 and breath sounds checked- equal and bilateral ?Secured at: 23 cm ?Tube secured with: Tape ?Dental Injury: Teeth and Oropharynx as per pre-operative assessment  ? ? ? ? ?

## 2021-07-09 NOTE — Assessment & Plan Note (Signed)
History of multiple CVAs: ?LDL 85.  Not on antiplatelets due to thrombocytopenia/ and AVMs ?

## 2021-07-09 NOTE — Assessment & Plan Note (Signed)
See above

## 2021-07-09 NOTE — Assessment & Plan Note (Signed)
Augment nutritional status ?

## 2021-07-09 NOTE — Assessment & Plan Note (Addendum)
Creat remains at baseline-to improve level of 1.1, encourage oral hydration ?Recent Labs  ?Lab 07/08/21 ?1052 07/09/21 ?0456 07/10/21 ?0458 07/11/21 ?0600  ?BUN 26* 29* 39* 38*  ?CREATININE 1.32* 1.47* 1.42* 1.10*  ? ?

## 2021-07-09 NOTE — Discharge Instructions (Signed)
 Dr. Shalen Petrak Adult Hip & Knee Specialist [facility] [address] , [place] [address] [phone]   POSTOPERATIVE DIRECTIONS    Hip Rehabilitation, Guidelines Following Surgery   WEIGHT BEARING Weight bearing as tolerated with assist device (walker, cane, etc) as directed, use it as long as suggested by your surgeon or therapist, typically at least 4-6 weeks.   HOME CARE INSTRUCTIONS  Remove items at home which could result in a fall. This includes throw rugs or furniture in walking pathways.  Continue medications as instructed at time of discharge.  You may have some home medications which will be placed on hold until you complete the course of blood thinner medication.  4 days after discharge, you may start showering. No tub baths or soaking your incisions. Do not put on socks or shoes without following the instructions of your caregivers.   Sit on chairs with arms. Use the chair arms to help push yourself up when arising.  Arrange for the use of a toilet seat elevator so you are not sitting low.   Walk with walker as instructed.  You may resume a sexual relationship in one month or when given the OK by your caregiver.  Use walker as long as suggested by your caregivers.  Avoid periods of inactivity such as sitting longer than an hour when not asleep. This helps prevent blood clots.  You may return to work once you are cleared by your surgeon.  Do not drive a car for 6 weeks or until released by your surgeon.  Do not drive while taking narcotics.  Wear elastic stockings for two weeks following surgery during the day but you may remove then at night.  Make sure you keep all of your appointments after your operation with all of your doctors and caregivers. You should call the office at the above phone number and make an appointment for approximately two weeks after the date of your surgery. Please pick up a stool softener and laxative  for home use as long as you are requiring pain medications.  ICE to the affected hip every three hours for 30 minutes at a time and then as needed for pain and swelling. Continue to use ice on the hip for pain and swelling from surgery. You may notice swelling that will progress down to the foot and ankle.  This is normal after surgery.  Elevate the leg when you are not up walking on it.   It is important for you to complete the blood thinner medication as prescribed by your doctor.  Continue to use the breathing machine which will help keep your temperature down.  It is common for your temperature to cycle up and down following surgery, especially at night when you are not up moving around and exerting yourself.  The breathing machine keeps your lungs expanded and your temperature down.  RANGE OF MOTION AND STRENGTHENING EXERCISES  These exercises are designed to help you keep full movement of your hip joint. Follow your caregiver's or physical therapist's instructions. Perform all exercises about fifteen times, three times per day or as directed. Exercise both hips, even if you have had only one joint replacement. These exercises can be done on a training (exercise) mat, on the floor, on a table or on a bed. Use whatever works the best and is most comfortable for you. Use music or television while you are exercising so that the exercises are a pleasant break in your day. This   will make your life better with the exercises acting as a break in routine you can look forward to.  Lying on your back, slowly slide your foot toward your buttocks, raising your knee up off the floor. Then slowly slide your foot back down until your leg is straight again.  Lying on your back spread your legs as far apart as you can without causing discomfort.  Lying on your side, raise your upper leg and foot straight up from the floor as far as is comfortable. Slowly lower the leg and repeat.  Lying on your back, tighten up the  muscle in the front of your thigh (quadriceps muscles). You can do this by keeping your leg straight and trying to raise your heel off the floor. This helps strengthen the largest muscle supporting your knee.  Lying on your back, tighten up the muscles of your buttocks both with the legs straight and with the knee bent at a comfortable angle while keeping your heel on the floor.   SKILLED REHAB INSTRUCTIONS: If the patient is transferred to a skilled rehab facility following release from the hospital, a list of the current medications will be sent to the facility for the patient to continue.  When discharged from the skilled rehab facility, please have the facility set up the patient's Home Health Physical Therapy prior to being released. Also, the skilled facility will be responsible for providing the patient with their medications at time of release from the facility to include their pain medication and their blood thinner medication. If the patient is still at the rehab facility at time of the two week follow up appointment, the skilled rehab facility will also need to assist the patient in arranging follow up appointment in our office and any transportation needs.  MAKE SURE YOU:  Understand these instructions.  Will watch your condition.  Will get help right away if you are not doing well or get worse.  Pick up stool softner and laxative for home use following surgery while on pain medications. Daily dry dressing changes as needed. In 4 days, you may remove your dressings and begin taking showers - no tub baths or soaking the incisions. Continue to use ice for pain and swelling after surgery. Do not use any lotions or creams on the incision until instructed by your surgeon.   

## 2021-07-09 NOTE — Assessment & Plan Note (Addendum)
Osteopenia: ?Xray Incidentally showed mild endplate compression deformity of T3 with demineralization. No significant tenderness on palpation in the back she had some pain in epigastrium.  She will need to follow-up with outpatient orthopedics/neuro.  ?

## 2021-07-09 NOTE — Assessment & Plan Note (Addendum)
B12 and folate are normal.  Monitor hemoglobin to watch for postop blood loss anemia ?

## 2021-07-09 NOTE — Assessment & Plan Note (Addendum)
No acute finding on echo,ekg non ischemic.  History of AVM in the brain and not on antiplatelets ?

## 2021-07-09 NOTE — Assessment & Plan Note (Addendum)
Platelet uptrending at 98 K.  Monitor CBC in 1 wk while on Lovenox.  Likely due to acute illness no other obvious etiology. was in normal range in December 2022 ?Recent Labs  ?Lab 07/08/21 ?1052 07/09/21 ?0456 07/10/21 ?0458 07/11/21 ?0600 07/12/21 ?5400  ?PLT 93* 91* 68* 73* 98*  ? ?

## 2021-07-09 NOTE — Consult Note (Signed)
ORTHOPAEDIC CONSULTATION  REQUESTING PHYSICIAN: Antonieta Pert, MD  PCP:  Unk Pinto, MD  Chief Complaint: Right hip pain  HPI: Tonya Thompson is a 66 y.o. 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  She had right hip pain and inability to weight-bear.  She was brought to the emergency department at [facility], where x-rays revealed a displaced right intertrochanteric femur fracture.  She was admitted by the hospitalist service for perioperative risk stratification and medical optimization.  Orthopedic consultation was placed for management of her right hip fracture.  She denies other injuries.  She complains of chronic subjective sensory change to the lower extremities.  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 Leida Lauth   BRONCHIAL BIOPSY  04/21/2021   Procedure: BRONCHIAL BIOPSIES;  Surgeon: Garner Nash, DO;  Location: [place] [facility];  Service: Pulmonary;;   BRONCHIAL BRUSHINGS  04/21/2021   Procedure:  BRONCHIAL BRUSHINGS;  Surgeon: Garner Nash, DO;  Location: [place] [facility];  Service: Pulmonary;;   BRONCHIAL WASHINGS  04/21/2021   Procedure: BRONCHIAL WASHINGS;  Surgeon: Garner Nash, DO;  Location: [place];  Service: Pulmonary;;   COLONOSCOPY  2017   [place], [place]   CYSTOSCOPY/URETEROSCOPY/HOLMIUM LASER/STENT PLACEMENT Left 05/10/2019   Procedure: CYSTOSCOPY/RETROGRADE/URETEROSCOPY/HOLMIUM LASER/STENT PLACEMENT;  Surgeon: Raynelle Bring, MD;  Location: [facility];  Service: Urology;  Laterality: Left;   FIDUCIAL MARKER PLACEMENT  04/21/2021   Procedure: FIDUCIAL MARKER PLACEMENT;  Surgeon: Garner Nash, DO;  Location: [place] [facility];  Service: Pulmonary;;   FOOT SURGERY Right 10/1985   ankle fusion, at [facility], Dr. Lorelee Cover   POLYPECTOMY  2017   multiple adenomas   [place] WITH RADIAL ENDOBRONCHIAL ULTRASOUND  04/21/2021   Procedure: RADIAL ENDOBRONCHIAL ULTRASOUND;  Surgeon: Garner Nash, DO;  Location: [facility];  Service: Pulmonary;;   WISDOM TOOTH EXTRACTION     Social History   Socioeconomic History   Marital status: Single    Spouse name: Not on file   Number of children: Not on file   Years of education: Not on file   Highest education level: Not on file  Occupational History   Not on file  Tobacco Use   Smoking status: Never    Passive exposure: Past   Smokeless tobacco: Never  Vaping Use   Vaping Use: Never used  Substance and Sexual Activity   Alcohol use: No  Drug use: No   Sexual activity: Not Currently    Birth control/protection: None    Comment: 1st intercourse 66 yo-Fewer than 5 partners  Other Topics Concern   Not on file  Social History Narrative   Right handed until 3rd grade    Left handed   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    Social Determinants of Health   Financial Resource Strain: Not on file  Food Insecurity: Not on file  Transportation Needs: Not on file  Physical Activity: Not on file   Stress: Not on file  Social Connections: Not on file   Family History  Problem Relation Age of Onset   Heart disease Mother    Heart failure Mother    Heart attack Mother    Diabetes Father    Hyperlipidemia Father    Dementia Father    Prostate cancer Father    Melanoma Father    Colon polyps Father    Hyperlipidemia Paternal Uncle    Hypertension Paternal Uncle    Dementia Paternal Uncle    Heart attack Maternal Grandfather    Stroke Paternal Grandmother    CAD Maternal Uncle    COPD Maternal Uncle        smoker   Breast cancer Neg Hx    Colon cancer Neg Hx    Esophageal cancer Neg Hx    Stomach cancer Neg Hx    Rectal cancer Neg Hx    Allergies  Allergen Reactions   Aspirin Other (See Comments)    CONTRAINDICATED DUE TO HISTORY OF BLEEDING IN BRAIN   Cheese     Cant take due to history of severe migraines   Chocolate     Cant take due to history of severe migraines   Coffee Flavor     Cant take due to history of severe migraines   Other      Nuts - Cant take due to history of severe migraines   Red Wine Complex [Germanium]     Cant take due to history of severe migraines   Prior to Admission medications   Medication Sig Start Date End Date Taking? Authorizing Provider  acetaminophen (TYLENOL) 325 MG tablet Take 650 mg by mouth every 4 (four) hours as needed for headache or mild pain.   Yes [provider]  baclofen (LIORESAL) 10 MG tablet Take 1 tablet up to four times daily as needed Patient taking differently: Take 10 mg by mouth 4 (four) times daily as needed for muscle spasms. 09/01/20  Yes Cameron Sprang, MD  Calcium Carbonate 500 MG CHEW Chew 500 mg by mouth daily.   Yes [provider]  Calcium Citrate-Vitamin D 315-5 MG-MCG TABS Take 1 tablet by mouth daily.   Yes [provider]  divalproex (DEPAKOTE ER) 500 MG 24 hr tablet Take 2 tablets every night Patient taking differently: Take 1,000 mg by mouth at bedtime. 09/01/20  Yes  Cameron Sprang, MD  Docosahexaenoic Acid (DHA NATURAL OMEGA-3 PO) Take 1 capsule by mouth daily. Omega-3 DHA (DR/EC) '350mg'$ -'325mg'$ -'90mg'$ -'597mg'$    Yes [provider]  fexofenadine (ALLEGRA) 180 MG tablet Take 180 mg by mouth daily.   Yes [provider]  guaiFENesin (MUCINEX) 600 MG 12 hr tablet Take 600 mg by mouth 2 (two) times daily as needed for cough.   Yes [provider]  Multiple Vitamins-Minerals (THEREMS-M) TABS Take 1 tablet by mouth daily.   Yes [provider]  nystatin powder Apply 1 application.  topically 2 (two) times daily as needed (rash under breast).   Yes [provider]  Propylene Glycol (SYSTANE BALANCE) 0.6 % SOLN Place 1 drop into both eyes 2 (two) times daily.   Yes [provider]  topiramate (TOPAMAX) 200 MG tablet Take 300 mg by mouth 2 (two) times daily.   Yes [provider]  Vitamin D3 (VITAMIN D) 25 MCG tablet Take 1,000 Units by mouth daily.   Yes [provider]  zinc oxide 20 % ointment Apply 1 application. topically as needed (after every incontinent episode).   Yes [provider]   DG Chest 1 View  Result Date: 07/08/2021 CLINICAL DATA:  [place] 3 hours ago, hip pain EXAM: CHEST  1 VIEW COMPARISON:  04/21/2021 FINDINGS: Normal heart size, mediastinal contours, and pulmonary vascularity. Lungs clear. Marker RIGHT upper lobe. No acute infiltrate, pleural effusion, or pneumothorax. Skin folds project over chest. Bones demineralized with chronic RIGHT glenohumeral degenerative changes. IMPRESSION: No acute abnormalities. Electronically Signed   By: Lavonia Dana M.D.   On: 07/08/2021 10:38   DG Thoracic Spine 2 View  Result Date: 07/08/2021 CLINICAL DATA:  [place] 3 hours ago EXAM: THORACIC SPINE 2 VIEWS COMPARISON:  None; correlation CT chest 04/15/2021 FINDINGS: Twelve pairs of ribs. Bones demineralized. Dextroconvex scoliosis lower thoracic spine. Mild superior endplate compression deformity of  approximately L3 vertebral body new since 04/15/2021 Vertebral body heights otherwise maintained without additional fracture, subluxation, or bone destruction. IMPRESSION: Osseous demineralization with dextroconvex thoracic scoliosis. New mild superior endplate compression deformity of T3 vertebral body since 04/15/2021. Electronically Signed   By: Lavonia Dana M.D.   On: 07/08/2021 10:36   DG Lumbar Spine Complete  Result Date: 07/08/2021 CLINICAL DATA:  [place] 3 hours ago EXAM: LUMBAR SPINE - COMPLETE 4+ VIEW COMPARISON:  None FINDINGS: Five non-rib-bearing lumbar vertebra. Osseous demineralization. Biconvex thoracolumbar scoliosis. Multilevel disc space narrowing and endplate spur formation. No fracture, subluxation, or bone destruction. SI joints preserved. Tubal ligation clips in pelvis. BILATERAL renal calculi largest LEFT kidney 13 mm diameter. IMPRESSION: Degenerative disc disease changes and scoliosis lumbar spine. No acute osseous abnormalities. BILATERAL renal calculi. Electronically Signed   By: Lavonia Dana M.D.   On: 07/08/2021 10:34   DG Pelvis 1-2 Views  Result Date: 07/08/2021 CLINICAL DATA:  [place] 3 hours ago, hip pain EXAM: PELVIS - 1-2 VIEW COMPARISON:  None FINDINGS: Osseous demineralization. Hip and SI joint spaces preserved. Mildly distracted and angulated intertrochanteric fracture RIGHT femur. No additional fracture, dislocation, or bone destruction. Tubal ligation clips in pelvis. IMPRESSION: Mildly displaced and angulated intertrochanteric fracture RIGHT femur. Electronically Signed   By: Lavonia Dana M.D.   On: 07/08/2021 10:37   DG Knee Complete 4 Views Right  Result Date: 07/08/2021 CLINICAL DATA:  Knee pain post fall EXAM: RIGHT KNEE - COMPLETE 4+ VIEW COMPARISON:  None FINDINGS: Osseous demineralization. Diffuse joint space narrowing. No acute fracture, dislocation, or bone destruction. No joint effusion. IMPRESSION: No acute osseous abnormalities. Electronically Signed   By: Lavonia Dana M.D.   On: 07/08/2021 10:30   ECHOCARDIOGRAM COMPLETE  Result Date: 07/09/2021    ECHOCARDIOGRAM REPORT   Patient Name:   KALENNA MILLETT Date of Exam: 07/09/2021 Medical Rec #:  [id number]        Height:       65.5 in Accession #:    [id number]       Weight:       144.0 lb Date of Birth:  27-Jun-1955  BSA:          1.730 m Patient Age:    14 years         BP:           117/77 mmHg Patient Gender: F                HR:           108 bpm. Exam Location:  Inpatient Procedure: 2D Echo, Color Doppler and Cardiac Doppler Indications:    Preop  History:        Patient has no prior history of Echocardiogram examinations.  Sonographer:    Jyl Heinz Referring Phys: [id number] [place] [address] [place] IMPRESSIONS  1. Left ventricular ejection fraction, by estimation, is 55 to 60%. The left ventricle has normal function. Left ventricular endocardial border not optimally defined to evaluate regional wall motion. There is mild concentric left ventricular hypertrophy. Left ventricular diastolic parameters are consistent with Grade I diastolic dysfunction (impaired relaxation).  2. Right ventricular systolic function was not well visualized. The right ventricular size is normal. There is normal pulmonary artery systolic pressure.  3. The mitral valve is grossly normal. No evidence of mitral valve regurgitation.  4. The aortic valve is tricuspid. Aortic valve regurgitation is not visualized. No aortic stenosis is present.  5. The inferior vena cava is normal in size with greater than 50% respiratory variability, suggesting right atrial pressure of 3 mmHg. Comparison(s): No prior Echocardiogram. Conclusion(s)/Recommendation(s): In future studies would perform with echo contrast. FINDINGS  Left Ventricle: Left ventricular ejection fraction, by estimation, is 55 to 60%. The left ventricle has normal function. Left ventricular endocardial border not optimally defined to evaluate regional wall motion. The left ventricular internal cavity  size was small. There is mild concentric left ventricular hypertrophy. Left ventricular diastolic parameters are consistent with Grade I diastolic dysfunction (impaired relaxation). Right Ventricle: The right ventricular size is normal. Right vetricular wall thickness was not well visualized. Right ventricular systolic function was not well visualized. There is normal pulmonary artery systolic pressure. The tricuspid regurgitant velocity is 2.52 m/s, and with an assumed right atrial pressure of 3 mmHg, the estimated right ventricular systolic pressure is 14.7 mmHg. Left Atrium: Left atrial size was normal in size. Right Atrium: Right atrial size was normal in size. Pericardium: Trivial pericardial effusion is present. Mitral Valve: The mitral valve is grossly normal. No evidence of mitral valve regurgitation. Tricuspid Valve: The tricuspid valve is normal in structure. Tricuspid valve regurgitation is mild . No evidence of tricuspid stenosis. Aortic Valve: The aortic valve is tricuspid. Aortic valve regurgitation is not visualized. No aortic stenosis is present. Aortic valve peak gradient measures 3.8 mmHg. Pulmonic Valve: The pulmonic valve was normal in structure. Pulmonic valve regurgitation is not visualized. No evidence of pulmonic stenosis. Aorta: The aortic root and ascending aorta are structurally normal, with no evidence of dilitation. Venous: The inferior vena cava is normal in size with greater than 50% respiratory variability, suggesting right atrial pressure of 3 mmHg. IAS/Shunts: No atrial level shunt detected by color flow Doppler.  LEFT VENTRICLE PLAX 2D LVIDd:         3.10 cm     Diastology LVIDs:         2.40 cm     LV e' medial:    3.92 cm/s LV PW:         1.20 cm     LV E/e' medial:  12.2 LV IVS:  1.00 cm     LV e' lateral:   7.18 cm/s LVOT diam:     1.80 cm     LV E/e' lateral: 6.7 LV SV:         38 LV SV Index:   22 LVOT Area:     2.54 cm  LV Volumes (MOD) LV vol d, MOD A2C: 44.7 ml LV  vol d, MOD A4C: 43.2 ml LV vol s, MOD A2C: 18.4 ml LV vol s, MOD A4C: 18.3 ml LV SV MOD A2C:     26.3 ml LV SV MOD A4C:     43.2 ml LV SV MOD BP:      25.4 ml RIGHT VENTRICLE            IVC RV S prime:     9.79 cm/s  IVC diam: 1.20 cm TAPSE (M-mode): 1.4 cm LEFT ATRIUM             Index LA diam:        1.80 cm 1.04 cm/m LA Vol (A2C):   6.8 ml  3.93 ml/m LA Vol (A4C):   6.6 ml  3.79 ml/m LA Biplane Vol: 7.0 ml  4.06 ml/m  AORTIC VALVE AV Area (Vmax): 2.53 cm AV Vmax:        97.30 cm/s AV Peak Grad:   3.8 mmHg LVOT Vmax:      96.60 cm/s LVOT Vmean:     68.500 cm/s LVOT VTI:       0.151 m  AORTA Ao Root diam: 3.10 cm Ao Asc diam:  2.80 cm MITRAL VALVE               TRICUSPID VALVE MV Area (PHT): 6.37 cm    TR Peak grad:   25.4 mmHg MV Decel Time: 119 msec    TR Vmax:        252.00 cm/s MV E velocity: 48.00 cm/s MV A velocity: 85.30 cm/s  SHUNTS MV E/A ratio:  0.56        Systemic VTI:  0.15 m                            Systemic Diam: 1.80 cm Rudean Haskell MD Electronically signed by Rudean Haskell MD Signature Date/Time: 07/09/2021/11:34:14 AM    Final    DG FEMUR, MIN 2 VIEWS RIGHT  Result Date: 07/08/2021 CLINICAL DATA:  Hip pain post fall 3 hours ago EXAM: RIGHT FEMUR 2 VIEWS COMPARISON:  08/22/2020 FINDINGS: Osseous demineralization. RIGHT hip and SI joint spaces preserved. Minimally distracted and angulated intertrochanteric fracture RIGHT femur. No dislocation. Visualized pelvis intact. IMPRESSION: Minimally distracted and angulated intertrochanteric fracture RIGHT femur. Electronically Signed   By: Lavonia Dana M.D.   On: 07/08/2021 10:31    Positive ROS: All other systems have been reviewed and were otherwise negative with the exception of those mentioned in the HPI and as above.  Physical Exam: General: Alert, no acute distress Cardiovascular: No pedal edema Respiratory: No cyanosis, no use of accessory musculature GI: No organomegaly, abdomen is soft and non-tender Skin: No lesions  in the area of chief complaint Neurologic: Sensation intact distally Psychiatric: Patient is competent for consent with normal mood and affect Lymphatic: No axillary or cervical lymphadenopathy  MUSCULOSKELETAL: Examination of the right hip reveals no skin wounds or lesions.  She is shortened and externally rotated.  She reports chronic sensory change to the lower extremity.  She has palpable pedal pulses.  She has very limited motor function and can minimally dorsiflex and plantarflex.  No EHL function.  Assessment: Displaced right intertrochanteric femur fracture. History of multiple strokes with significant right-sided weakness.  Plan: I discussed the findings with the patient.  She has an unstable right intertrochanteric femur fracture that requires surgical fixation.  We discussed the risk, benefits, and alternatives to intramedullary fixation of the right femur.  Please see statement of risk.  Plan for surgery today.  Continue NPO.  Hold chemical DVT prophylaxis.  All questions were solicited and answered.  The risks, benefits, and alternatives were discussed with the patient. There are risks associated with the surgery including, but not limited to, problems with anesthesia (death), infection, differences in leg length/angulation/rotation, fracture of bones, loosening or failure of implants, malunion, nonunion, hematoma (blood accumulation) which may require surgical drainage, blood clots, pulmonary embolism, nerve injury (foot drop), and blood vessel injury. The patient understands these risks and elects to proceed.  Bertram Savin, MD [phone]    07/09/2021 1:19 PM

## 2021-07-10 DIAGNOSIS — E872 Acidosis, unspecified: Secondary | ICD-10-CM

## 2021-07-10 DIAGNOSIS — E440 Moderate protein-calorie malnutrition: Secondary | ICD-10-CM | POA: Insufficient documentation

## 2021-07-10 DIAGNOSIS — S72141A Displaced intertrochanteric fracture of right femur, initial encounter for closed fracture: Secondary | ICD-10-CM | POA: Diagnosis not present

## 2021-07-10 LAB — BASIC METABOLIC PANEL
Anion gap: 10 (ref 5–15)
BUN: 39 mg/dL — ABNORMAL HIGH (ref 8–23)
CO2: 17 mmol/L — ABNORMAL LOW (ref 22–32)
Calcium: 8.3 mg/dL — ABNORMAL LOW (ref 8.9–10.3)
Chloride: 109 mmol/L (ref 98–111)
Creatinine, Ser: 1.42 mg/dL — ABNORMAL HIGH (ref 0.44–1.00)
GFR, Estimated: 42 mL/min — ABNORMAL LOW (ref >60)
Glucose, Bld: 134 mg/dL — ABNORMAL HIGH (ref 70–99)
Potassium: 3.9 mmol/L (ref 3.5–5.1)
Sodium: 136 mmol/L (ref 135–145)

## 2021-07-10 LAB — URINALYSIS, ROUTINE W REFLEX MICROSCOPIC
Bacteria, UA: NONE SEEN
Bilirubin Urine: NEGATIVE
Glucose, UA: NEGATIVE mg/dL
Hgb urine dipstick: NEGATIVE
Ketones, ur: NEGATIVE mg/dL
Nitrite: NEGATIVE
Protein, ur: 30 mg/dL — AB
Specific Gravity, Urine: 1.018 (ref 1.005–1.030)
pH: 6 (ref 5.0–8.0)

## 2021-07-10 LAB — CBC
HCT: 40.2 % (ref 36.0–46.0)
Hemoglobin: 13.4 g/dL (ref 12.0–15.0)
MCH: 36.3 pg — ABNORMAL HIGH (ref 26.0–34.0)
MCHC: 33.3 g/dL (ref 30.0–36.0)
MCV: 108.9 fL — ABNORMAL HIGH (ref 80.0–100.0)
Platelets: 68 10*3/uL — ABNORMAL LOW (ref 150–400)
RBC: 3.69 MIL/uL — ABNORMAL LOW (ref 3.87–5.11)
RDW: 12.8 % (ref 11.5–15.5)
WBC: 17.4 10*3/uL — ABNORMAL HIGH (ref 4.0–10.5)
nRBC: 0 % (ref 0.0–0.2)

## 2021-07-10 LAB — VALPROIC ACID LEVEL: Valproic Acid Lvl: 62 ug/mL (ref 50.0–100.0)

## 2021-07-10 LAB — AMMONIA: Ammonia: 76 umol/L — ABNORMAL HIGH (ref 9–35)

## 2021-07-10 MED ORDER — ENOXAPARIN SODIUM 30 MG/0.3ML IJ SOSY: 30.0000 mg | PREFILLED_SYRINGE | INTRAMUSCULAR | 0 refills | Status: DC

## 2021-07-10 MED ORDER — OXYCODONE HCL 5 MG PO TABS: 5.0000 mg | ORAL_TABLET | ORAL | 0 refills | Status: DC | PRN

## 2021-07-10 MED ORDER — KETOROLAC TROMETHAMINE 15 MG/ML IJ SOLN: 15.0000 mg | Freq: Three times a day (TID) | INTRAMUSCULAR | Status: DC | PRN

## 2021-07-10 MED ORDER — LIP MEDEX EX OINT
1.0000 "application " | TOPICAL_OINTMENT | CUTANEOUS | Status: DC | PRN
  Filled 2021-07-10: qty 14

## 2021-07-10 NOTE — Progress Notes (Signed)
?   07/10/21 1043 07/10/21 1303 07/10/21 1758  ?Assess: MEWS Score  ?Temp 97.9 ?F (36.6 ?C) 98.1 ?F (36.7 ?C) 97.8 ?F (36.6 ?C)  ?BP 115/81 121/86 122/77  ?Pulse Rate (!) 109 ?[facility] LPN notified) (!) 676 ?[facility] LPN notified) (!) 720  ?Resp (!) 8 ?(Justus Duerr LPN notified) (!) 8 ?(Dezarae Mcclaran LPN notified) 10  ?SpO2  --   --  94 %  ?Assess: MEWS Score  ?MEWS Temp 0 0 0  ?MEWS Systolic 0 0 0  ?MEWS Pulse '1 1 1  '$ ?MEWS RR '1 1 1  '$ ?MEWS LOC 0 0 0  ?MEWS Score '2 2 2  '$ ?MEWS Score Color Yellow Yellow Yellow  ?Assess: if the MEWS score is Yellow or Red  ?Were vital signs taken at a resting state?  --   --  Yes  ?Focused Assessment  --   --  No change from prior assessment  ?Does the patient meet 2 or more of the SIRS criteria?  --   --  No  ?Does the patient have a confirmed or suspected source of infection?  --   --  No  ?MEWS guidelines implemented *See Row Information*  --   --  No, previously yellow, continue vital signs every 4 hours  ?Treat  ?MEWS Interventions  --   --  Escalated (See documentation below)  ?Pain Scale  --   --  0-10  ?Pain Score  --   --  0  ?Take Vital Signs  ?Increase Vital Sign Frequency   --   --  Yellow: Q 2hr X 2 then Q 4hr X 2, if remains yellow, continue Q 4hrs  ?Escalate  ?MEWS: Escalate  --   --  Yellow: discuss with charge nurse/RN and consider discussing with provider and RRT  ?Notify: Charge Nurse/RN  ?Name of Charge Nurse/RN Notified  --   --  Mollie Germany, RN  ?Date Charge Nurse/RN Notified  --   --  07/10/21  ?Time Charge Nurse/RN Notified  --   --  1800  ?Notify: Provider  ?Provider Name/Title  --   --  Antonieta Pert , MD  ?Date Provider Notified  --   --  07/10/21  ?Time Provider Notified  --   --  1800  ?Notification Type  --   --  Call  ?Notification Reason  --   --  Change in status  ?Provider response  --   --  Other (Comment) ?([place] to evaluate)  ?Date of Provider Response  --   --  07/10/21  ?Time of Provider Response  --   --  1805  ?Document  ?Patient Outcome  --   --  Other  (Comment) ?(New orders and seixure intervwntions)  ?Progress note created (see row info)  --   --  Yes  ?Assess: SIRS CRITERIA  ?SIRS Temperature  0 0 0  ?SIRS Pulse '1 1 1  '$ ?SIRS Respirations  0 0 0  ?SIRS WBC 1 1 0  ?SIRS Score Sum  '2 2 1  '$ ? ?

## 2021-07-10 NOTE — Progress Notes (Signed)
?   07/10/21 1043  ?Assess: MEWS Score  ?Temp 97.9 ?F (36.6 ?C)  ?BP 115/81  ?Pulse Rate (!) 109 ?[facility] LPN notified)  ?Resp (!) 8 ?(Kavya Haag LPN notified)  ?SpO2 93 %  ?Assess: MEWS Score  ?MEWS Temp 0  ?MEWS Systolic 0  ?MEWS Pulse 1  ?MEWS RR 1  ?MEWS LOC 0  ?MEWS Score 2  ?MEWS Score Color Yellow  ?Assess: if the MEWS score is Yellow or Red  ?Were vital signs taken at a resting state? Yes  ?Focused Assessment Change from prior assessment (see assessment flowsheet)  ?Does the patient meet 2 or more of the SIRS criteria? Yes  ?Does the patient have a confirmed or suspected source of infection? No  ?MEWS guidelines implemented *See Row Information* Yes  ?Treat  ?MEWS Interventions Escalated (See documentation below)  ?Pain Scale 0-10  ?Pain Score 0  ?Take Vital Signs  ?Increase Vital Sign Frequency  Yellow: Q 2hr X 2 then Q 4hr X 2, if remains yellow, continue Q 4hrs  ?Escalate  ?MEWS: Escalate Yellow: discuss with charge nurse/RN and consider discussing with provider and RRT  ?Notify: Charge Nurse/RN  ?Name of Charge Nurse/RN Notified Thompson Caul, RN  ?Date Charge Nurse/RN Notified 07/10/21  ?Time Charge Nurse/RN Notified 1100  ?Notify: Provider  ?Provider Name/Title Antonieta Pert, MD  ?Date Provider Notified 07/10/21  ?Time Provider Notified 1100  ?Notification Type Face-to-face  ?Provider response At bedside  ?Date of Provider Response 07/10/21  ?Time of Provider Response 1100  ?Document  ?Patient Outcome Other (Comment)  ?Progress note created (see row info) Yes  ?Assess: SIRS CRITERIA  ?SIRS Temperature  0  ?SIRS Pulse 1  ?SIRS Respirations  0  ?SIRS WBC 1  ?SIRS Score Sum  2  ? ? ?

## 2021-07-10 NOTE — Assessment & Plan Note (Addendum)
Augment nutritional status appreciate dietitian input ?Nutrition Status: ?Nutrition Problem: Moderate Malnutrition ?Etiology: chronic illness (h/o multiple CVAs, right hemiparesis) ?Signs/Symptoms: moderate fat depletion, moderate muscle depletion ?Interventions: MVI, Ensure Enlive (each supplement provides 350kcal and 20 grams of protein) ? ? ?

## 2021-07-10 NOTE — Progress Notes (Signed)
PROGRESS NOTE Tonya Thompson  QIW:[id number] DOB: 10/13/1955 DOA: 07/08/2021 PCP: Unk Pinto, MD   Brief Narrative/Hospital Course: 66 y.o. 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  Portable chest radiograph with no acute cardiopulmonary pathology.  Right femur and pelvic x-ray showed right femur intertrochanteric fracture.  Thoracic spine show a new endplate compression deformity of T3 which is new since 04/15/2021.  She has also showed demineralization with dextroconvex thoracic scoliosis.  No acute findings on lumbosacral spine. Orthopedic was consulted and admitted.  At baseline patient has right-sided upper extremity weakness contracture but fairly ambulatory. Patient was taken to the OR 3/9 underwent operative intervention by Dr. Lyla Glassing    Subjective: Seen and examined this morning very hard of hearing. Patient had surgery 3/9.  Overnight no fever This morning's WBC has bumped up suspect postop reactive but no fever Some epigastric pain. Had some mild back pain but does not complain as severe.  Assessment and Plan: * Closed intertrochanteric fracture of right femur ([place]) 2/2 fall.S/P operative intervention 3/9 by Dr. Lyla Glassing. I will defer post op PT OT pain control DVT prophylaxis to orthopedic teams.  I will continue to address rest of the medical issues.  Coronary artery calcification No acute finding on echo,ekg non ischemic.  History of AVM in the brain and not on antiplatelets  Acute renal failure superimposed on stage 3b chronic kidney disease (HCC) Creat remains at baseline.cont to  monitor Recent Labs  Lab 07/08/21 1052 07/09/21 0456 07/10/21 0458  BUN 26* 29* 39*  CREATININE 1.32* 1.47* 1.42*    Right hemiparesis (HCC) History of multiple CVAs: LDL 85.  Not on antiplatelets due to thrombocytopenia/ and AVMs  Metabolic acidosis Bicarb at 17.  Monitor encourage p.o.  Malnutrition of moderate degree Augment nutritional status appreciate dietitian input  Thrombocytopenia ([place]) Platelet 98 K.  Monitor was in normal range in December 2022  Compression fracture of T3 vertebra (HCC) Osteopenia: Xray Incidentally showed endplate compression deformity of T3 with demineralization.  No significant tenderness on palpation in the back she had some pain in epigastrium./Adjacent chest but that will be too low for the radiation of pain from T3.  Continue pain control.  Abnormal LFTs Resolved.  Macrocytosis B12 and folate are normal.  Monitor hemoglobin to watch for postop blood loss anemia  Osteopenia See above  Hyperlipidemia, mixed Stable ldl  Epilepsy, grand mal ([place]) History of seizure disorder on Depakote and Topamax, continue same.  Monitor closely.  DVT prophylaxis: enoxaparin (LOVENOX) injection 30 mg Start: 07/10/21 0800 SCDs Start: 07/09/21 2017 SCDs Start: 07/08/21 1354 Code Status:   Code Status: Full Code Family Communication: plan of care discussed with patient at bedside.  Disposition: Currently not medically stable for discharge. Status is: Inpatient Remains inpatient appropriate because: For ongoing intervention of hip fracture Objective: Vitals last 24 hrs: Vitals:   07/09/21 2347 07/10/21 0432 07/10/21 0732 07/10/21 1043  BP: 119/90 121/78 (!) 129/95 115/81  Pulse: (!) 119 99 99 (!) 109  Resp: '16 14 10 '$ (!) 8  Temp: (!) 97.2 F (36.2 C) 97.8 F (36.6 C) 97.9 F (36.6 C)   TempSrc: Oral Oral Oral  SpO2: 94% 95% 96% 93%  Weight:      Height:       Weight change:   Physical Examination: General exam: AA, hard of hearing,  older than stated age, weak appearing. HEENT:Oral mucosa moist, Ear/Nose WNL grossly, dentition normal. Respiratory system: bilaterally diminished,no use of accessory muscle Cardiovascular system: S1 & S2 +, No JVD,. Gastrointestinal system: Abdomen soft,NT,ND, BS+ Nervous System:Alert, awake, moving extremities and grossly nonfocal Extremities: edema neg,distal peripheral pulses palpable.  Skin: No rashes,no icterus. MSK: Normal muscle bulk,tone, power Right hip surgical site with Aquacel dressing in place  Medications reviewed:  Scheduled Meds:  acetaminophen  1,000 mg Oral Once   divalproex  1,000 mg Oral QHS   docusate sodium  100 mg Oral BID   enoxaparin (LOVENOX) injection  30 mg Subcutaneous Q24H   feeding supplement  237 mL Oral BID BM   multivitamin with minerals  1 tablet Oral Daily   mupirocin ointment  1 application. Nasal BID   povidone-iodine  2 application. Topical Once   senna  1 tablet Oral BID   topiramate  300 mg Oral BID   Continuous Infusions:  sodium chloride 50 mL/hr at 07/09/21 2103      Diet Order             Diet regular Room service appropriate? Yes; Fluid consistency: Thin  Diet effective now                    Nutrition Problem: Moderate Malnutrition Etiology: chronic illness (h/o multiple CVAs, right hemiparesis) Signs/Symptoms: moderate fat depletion, moderate muscle depletion Interventions: MVI, Ensure Enlive (each supplement provides 350kcal and 20 grams of protein)   Intake/Output Summary (Last 24 hours) at 07/10/2021 1117 Last data filed at 07/10/2021 0400 Gross per 24 hour  Intake 2279.44 ml  Output 50 ml  Net 2229.44 ml   Net IO Since Admission: 2,254.79 mL [07/10/21 1117]  Wt Readings from Last 3 Encounters:  07/08/21 65.3 kg  06/17/21 67 kg  05/22/21 67.3 kg     Unresulted Labs (From admission, onward)     Start     Ordered   07/16/21 0500  Creatinine, serum  (enoxaparin (LOVENOX)  CrCl < 30 mL/min)  Weekly,   R      Comments: while on enoxaparin therapy.   Question:  Specimen collection method  Answer:  Lab=Lab collect   07/09/21 2017   07/10/21 0500  CBC  Daily,   R     Question:  Specimen collection method  Answer:  Lab=Lab collect   07/09/21 2017   07/10/21 5462  Basic metabolic panel  Daily,   R     Question:  Specimen collection method  Answer:  Lab=Lab collect   07/09/21 2017          Data Reviewed: I have personally reviewed following labs and imaging studies CBC: Recent Labs  Lab 07/08/21 1052 07/09/21 0456 07/10/21 0458  WBC 6.5 9.0 17.4*  NEUTROABS 5.0  --   --   HGB 15.3* 14.5 13.4  HCT 45.3 42.5 40.2  MCV 106.3* 105.7* 108.9*  PLT 93* 91* 68*   Basic Metabolic Panel: Recent Labs  Lab 07/08/21 1052 07/09/21 0456 07/10/21 0458  NA 139 137 136  K 3.9 3.8 3.9  CL 106 109 109  CO2 27 20* 17*  GLUCOSE 99 153* 134*  BUN 26* 29* 39*  CREATININE 1.32* 1.47* 1.42*  CALCIUM 9.4 8.9 8.3*  MG 2.4  --   --  PHOS 4.2  --   --    GFR: Estimated Creatinine Clearance: 37.3 mL/min (A) (by C-G formula based on SCr of 1.42 mg/dL (H)). Liver Function Tests: Recent Labs  Lab 07/08/21 1052 07/09/21 0456  AST 57* 41  ALT 34 28  ALKPHOS 80 63  BILITOT 0.7 0.6  PROT 6.5 5.6*  ALBUMIN 3.3* 2.8*   No results for input(s): LIPASE, AMYLASE in the last 168 hours. No results for input(s): AMMONIA in the last 168 hours. Coagulation Profile: No results for input(s): INR, PROTIME in the last 168 hours. Cardiac Enzymes: No results for input(s): CKTOTAL, CKMB, CKMBINDEX, TROPONINI in the last 168 hours. BNP (last 3 results) No results for input(s): PROBNP in the last 8760 hours. HbA1C: No results for input(s): HGBA1C in the last 72 hours. CBG: Recent Labs  Lab 07/09/21 1544 07/09/21 1815  GLUCAP 118* 127*   Lipid Profile: Recent Labs    07/09/21 0456  CHOL 158  HDL 58  LDLCALC 85  TRIG 74  CHOLHDL 2.7   Thyroid Function Tests: No results for input(s): TSH,  T4TOTAL, FREET4, T3FREE, THYROIDAB in the last 72 hours. Anemia Panel: Recent Labs    07/09/21 0456  VITAMINB12 853  FOLATE 45.2   Sepsis Labs: No results for input(s): PROCALCITON, LATICACIDVEN in the last 168 hours.  Recent Results (from the past 240 hour(s))  Resp Panel by RT-PCR (Flu A&B, Covid) Nasopharyngeal Swab     Status: None   Collection Time: 07/08/21 11:26 AM   Specimen: Nasopharyngeal Swab; Nasopharyngeal(NP) swabs in vial transport medium  Result Value Ref Range Status   SARS Coronavirus 2 by RT PCR NEGATIVE NEGATIVE Final    Comment: (NOTE) SARS-CoV-2 target nucleic acids are NOT DETECTED.  The SARS-CoV-2 RNA is generally detectable in upper respiratory specimens during the acute phase of infection. The lowest concentration of SARS-CoV-2 viral copies this assay can detect is 138 copies/mL. A negative result does not preclude SARS-Cov-2 infection and should not be used as the sole basis for treatment or other patient management decisions. A negative result may occur with  improper specimen collection/handling, submission of specimen other than nasopharyngeal swab, presence of viral mutation(s) within the areas targeted by this assay, and inadequate number of viral copies(<138 copies/mL). A negative result must be combined with clinical observations, patient history, and epidemiological information. The expected result is Negative.  Fact Sheet for Patients:  EntrepreneurPulse.com.au  Fact Sheet for Healthcare Providers:  IncredibleEmployment.be  This test is no t yet approved or cleared by the [place] FDA and  has been authorized for detection and/or diagnosis of SARS-CoV-2 by FDA under an Emergency Use Authorization (EUA). This EUA will remain  in effect (meaning this test can be used) for the duration of the COVID-19 declaration under Section 564(b)(1) of the Act, 21 U.S.C.section 360bbb-3(b)(1), unless the  authorization is terminated  or revoked sooner.       Influenza A by PCR NEGATIVE NEGATIVE Final   Influenza B by PCR NEGATIVE NEGATIVE Final    Comment: (NOTE) The Xpert Xpress SARS-CoV-2/FLU/RSV plus assay is intended as an aid in the diagnosis of influenza from Nasopharyngeal swab specimens and should not be used as a sole basis for treatment. Nasal washings and aspirates are unacceptable for Xpert Xpress SARS-CoV-2/FLU/RSV testing.  Fact Sheet for Patients: EntrepreneurPulse.com.au  Fact Sheet for Healthcare Providers: IncredibleEmployment.be  This test is not yet approved or cleared by the [place] FDA and has been authorized for detection and/or diagnosis of  SARS-CoV-2 by FDA under an Emergency Use Authorization (EUA). This EUA will remain in effect (meaning this test can be used) for the duration of the COVID-19 declaration under Section 564(b)(1) of the Act, 21 U.S.C. section 360bbb-3(b)(1), unless the authorization is terminated or revoked.  Performed at [facility], [place] [address], [place], [place] [address]   Surgical PCR screen     Status: None   Collection Time: 07/08/21  4:17 PM   Specimen: Nasal Mucosa; Nasal Swab  Result Value Ref Range Status   MRSA, PCR NEGATIVE NEGATIVE Final   Staphylococcus aureus NEGATIVE NEGATIVE Final    Comment: (NOTE) The Xpert SA Assay (FDA approved for NASAL specimens in patients 72 years of age and older), is one component of a comprehensive surveillance program. It is not intended to diagnose infection nor to guide or monitor treatment. Performed at [facility], [place] [address], [place], [place] [address]     Antimicrobials: Anti-infectives (From admission, onward)    Start     Dose/Rate Route Frequency Ordered Stop   07/09/21 2300  ceFAZolin (ANCEF) IVPB 2g/100 mL premix        2 g 200 mL/hr over 30 Minutes Intravenous Every 6 hours  07/09/21 2017 07/10/21 0713   07/09/21 1600  ceFAZolin (ANCEF) IVPB 2g/100 mL premix        2 g 200 mL/hr over 30 Minutes Intravenous On call to O.R. 07/08/21 2138 07/09/21 1710   07/08/21 1800  ceFAZolin (ANCEF) IVPB 2g/100 mL premix        2 g 200 mL/hr over 30 Minutes Intravenous On call to O.R. 07/08/21 1234 07/09/21 0559      Culture/Microbiology    Component Value Date/Time   SDES BRONCHIAL ALVEOLAR LAVAGE 04/21/2021 0945   SDES BRONCHIAL ALVEOLAR LAVAGE 04/21/2021 0945   SPECREQUEST RUL 04/21/2021 0945   SPECREQUEST RUL 04/21/2021 0945   CULT  04/21/2021 0945    NO GROWTH 2 DAYS Performed at [facility], [place] [address] [place], [place] [address]    CULT  04/21/2021 0945    No growth aerobically or anaerobically. Performed at [facility], [place] [address], [place], [place] [address]    REPTSTATUS 04/23/2021 FINAL 04/21/2021 0945   REPTSTATUS 04/26/2021 FINAL 04/21/2021 0945    Other culture-see note  Radiology Studies: Pelvis Portable  Result Date: 07/09/2021 CLINICAL DATA:  Status post intramedullary nail. EXAM: PORTABLE PELVIS 1-2 VIEWS COMPARISON:  Pelvis x-ray 07/08/2021 FINDINGS: There is a new short stem right femoral intramedullary nail and hip screw fixating intratrochanteric fracture. Alignment is anatomic. There is lateral soft tissue swelling and air compatible with recent surgery. There is no dislocation. There are surgical clips in the pelvis. IMPRESSION: 1. ORIF right femoral intratrochanteric fracture. Alignment is anatomic. Electronically Signed   By: Ronney Asters M.D.   On: 07/09/2021 19:05   DG C-Arm 1-60 Min-No Report  Result Date: 07/09/2021 Fluoroscopy was utilized by the requesting physician.  No radiographic interpretation.   ECHOCARDIOGRAM COMPLETE  Result Date: 07/09/2021    ECHOCARDIOGRAM REPORT   Patient Name:   ARY RUDNICK Date of Exam: 07/09/2021 Medical Rec #:  [id number]        Height:       65.5 in Accession #:    [id number]        Weight:       144.0 lb Date of Birth:  06-29-1955        BSA:  1.730 m Patient Age:    76 years         BP:           117/77 mmHg Patient Gender: F                HR:           108 bpm. Exam Location:  Inpatient Procedure: 2D Echo, Color Doppler and Cardiac Doppler Indications:    Preop  History:        Patient has no prior history of Echocardiogram examinations.  Sonographer:    Jyl Heinz Referring Phys: [id number] [place] IMPRESSIONS  1. Left ventricular ejection fraction, by estimation, is 55 to 60%. The left ventricle has normal function. Left ventricular endocardial border not optimally defined to evaluate regional wall motion. There is mild concentric left ventricular hypertrophy. Left ventricular diastolic parameters are consistent with Grade I diastolic dysfunction (impaired relaxation).  2. Right ventricular systolic function was not well visualized. The right ventricular size is normal. There is normal pulmonary artery systolic pressure.  3. The mitral valve is grossly normal. No evidence of mitral valve regurgitation.  4. The aortic valve is tricuspid. Aortic valve regurgitation is not visualized. No aortic stenosis is present.  5. The inferior vena cava is normal in size with greater than 50% respiratory variability, suggesting right atrial pressure of 3 mmHg. Comparison(s): No prior Echocardiogram. Conclusion(s)/Recommendation(s): In future studies would perform with echo contrast. FINDINGS  Left Ventricle: Left ventricular ejection fraction, by estimation, is 55 to 60%. The left ventricle has normal function. Left ventricular endocardial border not optimally defined to evaluate regional wall motion. The left ventricular internal cavity size was small. There is mild concentric left ventricular hypertrophy. Left ventricular diastolic parameters are consistent with Grade I diastolic dysfunction (impaired relaxation). Right Ventricle: The right ventricular size is normal. Right vetricular  wall thickness was not well visualized. Right ventricular systolic function was not well visualized. There is normal pulmonary artery systolic pressure. The tricuspid regurgitant velocity is 2.52 m/s, and with an assumed right atrial pressure of 3 mmHg, the estimated right ventricular systolic pressure is 67.3 mmHg. Left Atrium: Left atrial size was normal in size. Right Atrium: Right atrial size was normal in size. Pericardium: Trivial pericardial effusion is present. Mitral Valve: The mitral valve is grossly normal. No evidence of mitral valve regurgitation. Tricuspid Valve: The tricuspid valve is normal in structure. Tricuspid valve regurgitation is mild . No evidence of tricuspid stenosis. Aortic Valve: The aortic valve is tricuspid. Aortic valve regurgitation is not visualized. No aortic stenosis is present. Aortic valve peak gradient measures 3.8 mmHg. Pulmonic Valve: The pulmonic valve was normal in structure. Pulmonic valve regurgitation is not visualized. No evidence of pulmonic stenosis. Aorta: The aortic root and ascending aorta are structurally normal, with no evidence of dilitation. Venous: The inferior vena cava is normal in size with greater than 50% respiratory variability, suggesting right atrial pressure of 3 mmHg. IAS/Shunts: No atrial level shunt detected by color flow Doppler.  LEFT VENTRICLE PLAX 2D LVIDd:         3.10 cm     Diastology LVIDs:         2.40 cm     LV e' medial:    3.92 cm/s LV PW:         1.20 cm     LV E/e' medial:  12.2 LV IVS:        1.00 cm     LV e' lateral:  7.18 cm/s LVOT diam:     1.80 cm     LV E/e' lateral: 6.7 LV SV:         38 LV SV Index:   22 LVOT Area:     2.54 cm  LV Volumes (MOD) LV vol d, MOD A2C: 44.7 ml LV vol d, MOD A4C: 43.2 ml LV vol s, MOD A2C: 18.4 ml LV vol s, MOD A4C: 18.3 ml LV SV MOD A2C:     26.3 ml LV SV MOD A4C:     43.2 ml LV SV MOD BP:      25.4 ml RIGHT VENTRICLE            IVC RV S prime:     9.79 cm/s  IVC diam: 1.20 cm TAPSE (M-mode): 1.4  cm LEFT ATRIUM             Index LA diam:        1.80 cm 1.04 cm/m LA Vol (A2C):   6.8 ml  3.93 ml/m LA Vol (A4C):   6.6 ml  3.79 ml/m LA Biplane Vol: 7.0 ml  4.06 ml/m  AORTIC VALVE AV Area (Vmax): 2.53 cm AV Vmax:        97.30 cm/s AV Peak Grad:   3.8 mmHg LVOT Vmax:      96.60 cm/s LVOT Vmean:     68.500 cm/s LVOT VTI:       0.151 m  AORTA Ao Root diam: 3.10 cm Ao Asc diam:  2.80 cm MITRAL VALVE               TRICUSPID VALVE MV Area (PHT): 6.37 cm    TR Peak grad:   25.4 mmHg MV Decel Time: 119 msec    TR Vmax:        252.00 cm/s MV E velocity: 48.00 cm/s MV A velocity: 85.30 cm/s  SHUNTS MV E/A ratio:  0.56        Systemic VTI:  0.15 m                            Systemic Diam: 1.80 cm Rudean Haskell MD Electronically signed by Rudean Haskell MD Signature Date/Time: 07/09/2021/11:34:14 AM    Final    DG HIP UNILAT WITH PELVIS 2-3 VIEWS RIGHT  Result Date: 07/09/2021 CLINICAL DATA:  Intramedullary nail intertrochanteric right femur fluoroscopy EXAM: DG HIP (WITH OR WITHOUT PELVIS) 2-3V RIGHT COMPARISON:  Right femur radiographs 07/08/2021 FINDINGS: Images were performed intraoperatively without the presence of a radiologist. Total fluoroscopy images: 2 Total dose: 5.23 mGy Total fluoroscopy time: 34 seconds The patient appears to be undergoing cephalomedullary nail fixation of the prior intertrochanteric fracture. Please see intraoperative findings for further detail. IMPRESSION: Cephalomedullary nail fixation of prior intertrochanteric fracture. Electronically Signed   By: Yvonne Kendall M.D.   On: 07/09/2021 17:55     LOS: 2 days   Antonieta Pert, MD [facility]  07/10/2021, 11:17 AM

## 2021-07-10 NOTE — Progress Notes (Signed)
?   07/10/21 1043 07/10/21 1303  ?Assess: MEWS Score  ?Temp 97.9 ?F (36.6 ?C) 98.1 ?F (36.7 ?C)  ?BP 115/81 121/86  ?Pulse Rate (!) 109 ?[facility] LPN notified) (!) 159 ?[facility] LPN notified)  ?Resp (!) 8 ?(Sanika Brosious LPN notified) (!) 8 ?(Paige Vanderwoude LPN notified)  ?SpO2  --  96 %  ?Assess: MEWS Score  ?MEWS Temp 0 0  ?MEWS Systolic 0 0  ?MEWS Pulse 1 1  ?MEWS RR 1 1  ?MEWS LOC 0 0  ?MEWS Score 2 2  ?MEWS Score Color Yellow Yellow  ?Assess: if the MEWS score is Yellow or Red  ?Were vital signs taken at a resting state?  --  Yes  ?Focused Assessment  --  No change from prior assessment  ?Does the patient meet 2 or more of the SIRS criteria?  --  Yes  ?Does the patient have a confirmed or suspected source of infection?  --  No  ?MEWS guidelines implemented *See Row Information*  --  No, previously yellow, continue vital signs every 4 hours  ?Treat  ?MEWS Interventions  --  Other (Comment)  ?Pain Scale  --  0-10  ?Pain Score  --  0  ?Take Vital Signs  ?Increase Vital Sign Frequency   --  Yellow: Q 2hr X 2 then Q 4hr X 2, if remains yellow, continue Q 4hrs  ?Escalate  ?MEWS: Escalate  --  Yellow: discuss with charge nurse/RN and consider discussing with provider and RRT  ?Notify: Charge Nurse/RN  ?Name of Charge Nurse/RN Notified  --  Mollie Germany, RN  ?Date Charge Nurse/RN Notified  --  07/10/21  ?Time Charge Nurse/RN Notified  --  1310  ?Document  ?Patient Outcome  --  Other (Comment)  ?Progress note created (see row info)  --  Yes  ?Assess: SIRS CRITERIA  ?SIRS Temperature  0 0  ?SIRS Pulse 1 1  ?SIRS Respirations  0 0  ?SIRS WBC 1 1  ?SIRS Score Sum  2 2  ? ? ?

## 2021-07-10 NOTE — Evaluation (Signed)
Occupational Therapy Evaluation Patient Details Name: Tonya Thompson MRN: [id number] DOB: Dec 08, 1955 Today's Date: 07/10/2021   History of Present Illness 66 y.o. 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 Patient s/p IM nail.   Clinical Impression   [name] is a 66 year old woman who presents s/p hip surgery with dense RIGHT sided hemiplegia, decreased ROM, strength and coordination of RLE compared to baseline, impaired balance, impaired tone and sensation of right side, no functional use of RUE, and pain. At baseline she is mostly independent with ADLs needing assistance with socks/hose and ambulating with rollator. Patient required total assist for bed mobility today and limited by pain. With transfer patient exhibiting odd muscle tone (or drawing up of muscles) on her LEFT side with inability to flex left knee to get foot to the ground, some left lateral flexion of her neck and trunk. Therapist could move neck to neutral and tilt her head down but patient would return to position with neck somewhat extended. She exhibited significantly poor balance with a posterior lean and required max assist to sit edge of bed. She was returned to supine for safety. She exhibits some expressive difficulties and needs increased time to speak and process questions. One episode of looking blankly at therapist with eyes tilted upward until therapist tapped her cheek (approx 5-6 seconds) and then she responded . RN informed of patient's behaviors. Patient will benefit from skilled OT services while in hospital to improve deficits and learn compensatory strategies as needed in order to return to PLOF.   Recommend short term rehab at discharge.      Recommendations for  follow up therapy are one component of a multi-disciplinary discharge planning process, led by the attending physician.  Recommendations may be updated based on patient status, additional functional criteria and insurance authorization.   Follow Up Recommendations  Skilled nursing-short term rehab (<3 hours/day)    Assistance Recommended at Discharge Frequent or constant Supervision/Assistance  Patient can return home with the following Two people to help with walking and/or transfers;Two people to help with bathing/dressing/bathroom;Assistance with cooking/housework;Direct supervision/assist for financial management;Help with stairs or ramp for entrance;Assist for transportation    Functional Status Assessment  Patient has had a recent decline in their functional status and demonstrates the ability to make significant improvements in function in a reasonable and predictable amount of time.  Equipment Recommendations  None recommended by OT    Recommendations for Other Services       Precautions / Restrictions Precautions Precautions: Fall Precaution Comments: R hemiplgia, R shoulder sublux, hx of seizures, Restrictions Weight Bearing Restrictions: Yes RLE Weight Bearing: Weight bearing as tolerated         Balance Overall balance assessment: Needs assistance Sitting-balance support: No upper extremity supported, Feet unsupported Sitting balance-Leahy Scale: Zero   Postural control: Posterior lean     Standing balance comment: deferred                           ADL either performed or assessed with clinical judgement   ADL Overall ADL's : Needs assistance/impaired Eating/Feeding: Set up;Bed level   Grooming: Set up;Bed level   Upper Body Bathing: Moderate assistance;Bed level   Lower Body Bathing: Total assistance;Bed level  Upper Body Dressing : Maximal assistance;Bed level   Lower Body Dressing: Total assistance;Bed level   Toilet Transfer: Total  assistance;+2 for physical assistance   Toileting- Clothing Manipulation and Hygiene: Total assistance;Bed level       Functional mobility during ADLs: +2 for physical assistance;Total assistance General ADL Comments: Patient supine in bed and agreeable to therapy. Reports smoe hip pain. Required +2 physical assistance to transfer to EOB. Patient fully hemiplegic on right with tone. Patient also exhibiting a "drawing up" on the left side - not able to get foot on to the floor, left lateral neck and trunk flexion. Unable to maintain balance - required max assist to keep balance at edge of bed from posterior lean.     Vision Patient Visual Report: No change from baseline       Perception     Praxis      Pertinent Vitals/Pain Pain Assessment Pain Assessment: Faces Faces Pain Scale: Hurts little more Pain Location: R hip Pain Descriptors / Indicators: Grimacing, Guarding Pain Intervention(s): Limited activity within patient's tolerance     Hand Dominance Left   Extremity/Trunk Assessment Upper Extremity Assessment Upper Extremity Assessment: RUE deficits/detail;LUE deficits/detail RUE Deficits / Details: No active movement, shoulder sublux, extensor pattern with wrist and fingers flexed, some elbwo PROM. RUE Sensation: decreased light touch (Impaired sensation) RUE Coordination: decreased fine motor;decreased gross motor LUE Deficits / Details: WFL ROM and grossly 4/5 strength LUE Sensation: WNL LUE Coordination: WNL   Lower Extremity Assessment Lower Extremity Assessment: Defer to PT evaluation   Cervical / Trunk Assessment Cervical / Trunk Assessment: Normal (Patient exhibiting odd neck posturing - left lateral tilt and next extended. Can be brought out of it.)   Communication Communication Communication: Expressive difficulties   Cognition Arousal/Alertness: Awake/alert Behavior During Therapy: WFL for tasks assessed/performed Overall Cognitive Status: Within  Functional Limits for tasks assessed                                 General Comments: Has some expressive difficulties. needs increased time to speak and respond. HOH.     General Comments       Exercises     Shoulder Instructions      Home Living Family/patient expects to be discharged to:: Skilled nursing facility                                 Additional Comments: [facility]. .      Prior Functioning/Environment Prior Level of Function : Independent/Modified Independent             Mobility Comments: uses rollator with one hand ADLs Comments: Predominantly independent - needs assistance for socks, compression hose and randomly for pants but otherwise independent        OT Problem List: Decreased strength;Decreased range of motion;Decreased activity tolerance;Impaired balance (sitting and/or standing);Decreased coordination;Decreased knowledge of use of DME or AE;Decreased knowledge of precautions;Impaired sensation;Impaired tone;Impaired UE functional use;Pain      OT Treatment/Interventions: Self-care/ADL training;Therapeutic exercise;Neuromuscular education;DME and/or AE instruction;Therapeutic activities;Patient/family education;Balance training    OT Goals(Current goals can be found in the care plan section) Acute Rehab OT Goals Patient Stated Goal: return to normal OT Goal Formulation: With patient Time For Goal Achievement: 07/24/21 Potential to Achieve Goals: Good  OT Frequency: Min 2X/week    Co-evaluation  AM-PAC OT "6 Clicks" Daily Activity     Outcome Measure Help from another person eating meals?: A Little Help from another person taking care of personal grooming?: A Little Help from another person toileting, which includes using toliet, bedpan, or urinal?: Total Help from another person bathing (including washing, rinsing, drying)?: A Lot Help from another person to put on and  taking off regular upper body clothing?: A Lot Help from another person to put on and taking off regular lower body clothing?: Total 6 Click Score: 12   End of Session Nurse Communication: Mobility status  Activity Tolerance: Patient limited by pain Patient left: in bed;with bed alarm set;with SCD's reapplied;with call bell/phone within reach  OT Visit Diagnosis: Other abnormalities of gait and mobility (R26.89);Pain;Hemiplegia and hemiparesis Hemiplegia - Right/Left: Right Hemiplegia - dominant/non-dominant: Dominant Hemiplegia - caused by: Cerebral infarction Pain - Right/Left: Right Pain - part of body: Hip                Time: 9935-7017 OT Time Calculation (min): 19 min Charges:  OT General Charges $OT Visit: 1 Visit OT Evaluation $OT Eval Moderate Complexity: 1 Mod  Zula Hovsepian, OTR/L [place]  Office [phone] Pager: [place] 07/10/2021, 10:54 AM

## 2021-07-10 NOTE — Progress Notes (Signed)
? ? ?  Subjective: ? ?Patient reports pain as mild to moderate.  Denies N/V/CP/SOB.  ? ?Objective:  ? ?VITALS:   ?Vitals:  ? 07/09/21 2347 07/10/21 0432 07/10/21 0732 07/10/21 1043  ?BP: 119/90 121/78 (!) 129/95 115/81  ?Pulse: (!) 119 99 99 (!) 109  ?Resp: $Remov'16 14 10 'PUdqHi$ (!) 8  ?Temp: (!) 97.2 ?F (36.2 ?C) 97.8 ?F (36.6 ?C) 97.9 ?F (36.6 ?C)   ?TempSrc: Oral Oral Oral   ?SpO2: 94% 95% 96% 93%  ?Weight:      ?Height:      ? ? ?NAD ?Intact pulses distally ?Dorsiflexion/Plantar flexion intact ?Incision: dressing C/D/I ?Compartment soft ? ? ?Lab Results  ?Component Value Date  ? WBC 17.4 (H) 07/10/2021  ? HGB 13.4 07/10/2021  ? HCT 40.2 07/10/2021  ? MCV 108.9 (H) 07/10/2021  ? PLT 68 (L) 07/10/2021  ? ?BMET ?   ?Component Value Date/Time  ? NA 136 07/10/2021 0458  ? K 3.9 07/10/2021 0458  ? CL 109 07/10/2021 0458  ? CO2 17 (L) 07/10/2021 0458  ? GLUCOSE 134 (H) 07/10/2021 0458  ? BUN 39 (H) 07/10/2021 0458  ? CREATININE 1.42 (H) 07/10/2021 0458  ? CREATININE 1.42 (H) 04/03/2021 1003  ? CALCIUM 8.3 (L) 07/10/2021 0458  ? EGFR 42 (L) 04/03/2021 1003  ? GFRNONAA 42 (L) 07/10/2021 0458  ? GFRNONAA 41 (L) 06/26/2020 1001  ? ? ? ?Assessment/Plan: ?1 Day Post-Op  ? ?Principal Problem: ?  Closed intertrochanteric fracture of right femur ([place]) ?Active Problems: ?  Epilepsy, grand mal ([place]) ?  Right hemiparesis ([place]) ?  Hyperlipidemia, mixed ?  Acute renal failure superimposed on stage 3b chronic kidney disease ([place]) ?  Osteopenia ?  Coronary artery calcification ?  Macrocytosis ?  Abnormal LFTs ?  Compression fracture of T3 vertebra ([facility]) ?  Thrombocytopenia ([place]) ?  Malnutrition of moderate degree ?  Metabolic acidosis ? ? ?WBAT with walker ?DVT ppx: Lovenox, SCDs, TEDS ?PO pain control ?PT/OT ?Dispo: SNF placement, Rx for DVT ppx and pain meds on chart ? ? ?Tonya Thompson ?07/10/2021, 12:49 PM ? ? ?Tonya Can, MD ?[phone] ?[place] [facility] [employer]  [place] ?[address],  [place], [place] [address] ?Phone: [phone] ?[email or web address] ?Facebook  Engineer, structural  ?  ? ? ?

## 2021-07-10 NOTE — Assessment & Plan Note (Addendum)
Bicarb improving encourage p.o. ?

## 2021-07-10 NOTE — Progress Notes (Signed)
?   07/09/21 2112  ?Assess: MEWS Score  ?Level of Consciousness Alert  ?Assess: MEWS Score  ?MEWS Temp 0  ?MEWS Systolic 0  ?MEWS Pulse 2  ?MEWS RR 0  ?MEWS LOC 0  ?MEWS Score 2  ?MEWS Score Color Yellow  ?Treat  ?Pain Scale 0-10  ?Pain Score Asleep  ?Document  ?Patient Outcome Stabilized after interventions  ? ?Pt asymptomatic possible false yellow d/t v/s being taken on wrong arm. ?

## 2021-07-10 NOTE — Op Note (Signed)
OPERATIVE REPORT ? ?SURGEON: Rod Can, MD  ? ?ASSISTANT: Almira Coaster, RNFA ? ?PREOPERATIVE DIAGNOSIS: Right intertrochanteric femur fracture.  ? ?POSTOPERATIVE DIAGNOSIS: Right intertrochanteric femur fracture.  ? ?PROCEDURE: Intramedullary fixation, Right femur.  ? ?IMPLANTS: Biomet Affixus Hip Fracture Nail, 9 by 180 mm, 125 degrees. ?10.5 x 90 mm Hip Fracture Nail Lag Screw. ?5 x 30 mm distal interlocking screw ?1. ? ?ANESTHESIA:  General ? ?ESTIMATED BLOOD LOSS:-50 mL   ? ?ANTIBIOTICS: 2 g Ancef. ? ?DRAINS: None. ? ?COMPLICATIONS: None. ?  ?CONDITION: PACU - hemodynamically stable..  ? ?BRIEF CLINICAL NOTE: Tonya Thompson is a 66 y.o. female who presented with an intertrochanteric femur fracture. The patient was admitted to the hospitalist service and underwent perioperative risk stratification and medical optimization. The risks, benefits, and alternatives to the procedure were explained, and the patient elected to proceed. ? ?PROCEDURE IN DETAIL: Surgical site was marked by myself. The patient was taken to the operating room and anesthesia was induced on the bed. The patient was then transferred to the [facility] table and the nonoperative lower extremity was scissored underneath the operative side. The fracture was reduced with traction, internal rotation, and adduction. The hip was prepped and draped in the normal sterile surgical fashion. Timeout was called verifying side and site of surgery. Preop antibiotics were given with 60 minutes of beginning the procedure. ? ?Fluoroscopy was used to define the patient's anatomy. A 4 cm incision was made just proximal to the tip of the greater trochanter. The awl was used to obtain the standard starting point for a trochanteric entry nail under fluoroscopic control. The guidepin was placed. The entry reamer was used to open the proximal femur. ? ?On the back table, the nail was assembled onto the jig. The nail was placed into the femur without any difficulty.  Through a separate stab incision, the cannula was placed down to the bone in preparation for the cephalomedullary device. A guidepin was placed into the femoral head using AP and lateral fluoroscopy views. The pin was measured, and then reaming was performed to the appropriate depth. The lag screw was inserted to the appropriate depth. The fracture was compressed through the jig. The setscrew was tightened and then loosened one quarter turn. A separate stab incision was created, and the distal interlocking screw was placed using standard AO technique. The jig was removed. Final AP and lateral fluoroscopy views were obtained to confirm fracture reduction and hardware placement. Tip apex distance was appropriate. There was no chondral penetration. ? ?The wounds were copiously irrigated with saline. The wound was closed in layers with #1 Vicryl for the fascia, 2-0 Monocryl for the deep dermal layer, and skin staples. Glue was applied to the skin. Once the glue was fully hardened, sterile dressing was applied. The patient was then awakened from anesthesia and taken to the PACU in stable condition. Sponge needle and instrument counts were correct at the end of the case ?2. There were no known complications. ? ?We will readmit the patient to the hospitalist. Weightbearing status will be weightbearing as tolerated with a walker. We will begin Lovenox for DVT prophylaxis. The patient will work with physical therapy and undergo disposition planning. ?

## 2021-07-10 NOTE — TOC Initial Note (Signed)
Transition of Care (TOC) - Initial/Assessment Note  ? ? ?Patient Details  ?Name: Tonya Thompson ?MRN: [id number] ?Date of Birth: 1955-10-07 ? ?Transition of Care (TOC) CM/SW Contact:    ?Trish Mage, LCSW ?Phone Number: ?07/10/2021, 12:58 PM ? ?Clinical Narrative:    Patient who is resident at [facility] is in need of rehab.  Contacted [name] with [facility] who has reserved a bed for patient.  She will have weekend on-call person contact TOC on Saturday for update of d/c date. Weekend admission is possible. FL2 completed. TOC will continue to follow during the course of hospitalization. ?             ? ? ?Expected Discharge Plan: [place] ?Barriers to Discharge: No Barriers Identified ? ? ?Patient Goals and CMS Choice ?Patient states their goals for this hospitalization and ongoing recovery are:: go home ?CMS Medicare.gov Compare Post Acute Care list provided to:: Patient ?Choice offered to / list presented to : Patient ? ?Expected Discharge Plan and Services ?Expected Discharge Plan: [place] ?  ?Discharge Planning Services: CM Consult ?  ?Living arrangements for the past 2 months: [place] ?                ?  ?  ?  ?  ?  ?  ?  ?  ?  ?  ? ?Prior Living Arrangements/Services ?Living arrangements for the past 2 months: [place] ?Lives with:: Facility Resident ?Patient language and need for interpreter reviewed:: Yes ?       ?Need for Family Participation in Patient Care: Yes (Comment) ?Care giver support system in place?: Yes (comment) ?Current home services: DME ?Criminal Activity/Legal Involvement Pertinent to Current Situation/Hospitalization: No - Comment as needed ? ?Activities of Daily Living ?Home Assistive Devices/Equipment: Chana Bode (specify type) ?ADL Screening (condition at time of admission) ?Patient's cognitive ability adequate to safely complete daily activities?: No ?Is the patient deaf or have difficulty  hearing?: Yes ?Does the patient have difficulty seeing, even when wearing glasses/contacts?: No ?Does the patient have difficulty concentrating, remembering, or making decisions?: Yes ?Patient able to express need for assistance with ADLs?: Yes ?Does the patient have difficulty dressing or bathing?: Yes ?Independently performs ADLs?: No ?Communication: Appropriate for developmental age ?Is this a change from baseline?: Pre-admission baseline ?Dressing (OT): Dependent, Needs assistance ?Is this a change from baseline?: Pre-admission baseline ?Grooming: Dependent, Needs assistance ?Is this a change from baseline?: Pre-admission baseline ?Feeding: Needs assistance ?Is this a change from baseline?: Pre-admission baseline ?Bathing: Dependent, Needs assistance ?Is this a change from baseline?: Pre-admission baseline ?Toileting: Dependent, Needs assistance ?Is this a change from baseline?: Pre-admission baseline ?In/Out Bed: Dependent, Needs assistance ?Is this a change from baseline?: Pre-admission baseline ?Walks in Home: Dependent, Needs assistance ?Is this a change from baseline?: Pre-admission baseline ?Does the patient have difficulty walking or climbing stairs?: Yes ?Weakness of Legs: Both ?Weakness of Arms/Hands: Both ? ?Permission Sought/Granted ?  ?  ?   ?   ?   ?   ? ?Emotional Assessment ?Appearance:: Appears stated age ?  ?  ?Orientation: : Oriented to Self, Oriented to Place, Oriented to Situation ?Alcohol / Substance Use: Not Applicable ?Psych Involvement: No (comment) ? ?Admission diagnosis:  Fall [W19.XXXA] ?Closed intertrochanteric fracture of right femur ([place]) [S72.141A] ?Closed displaced intertrochanteric fracture of right femur, initial encounter ([place]) [S72.141A] ?Closed wedge compression fracture of T3 vertebra, initial encounter ([place]) [S22.030A] ?Patient Active Problem List  ?  Diagnosis Date Noted  ? Malnutrition of moderate degree 07/10/2021  ? Metabolic acidosis 82/50/5397  ? Thrombocytopenia ([place])  07/09/2021  ? Closed intertrochanteric fracture of right femur ([place]) 07/08/2021  ? Macrocytosis 07/08/2021  ? Abnormal LFTs 07/08/2021  ? Compression fracture of T3 vertebra ([place]) 07/08/2021  ? Impaired cognition 06/09/2021  ? Migraine 06/09/2021  ? Gait abnormality 06/09/2021  ? Coronary artery calcification 04/30/2021  ? Abnormal TSH 04/04/2021  ? Elevated LFTs 04/04/2021  ? Deterioration in renal function 04/04/2021  ? Dense breasts 12/19/2020  ? History of CVA (cerebrovascular accident) 12/18/2020  ? Pulmonary nodules 12/18/2020  ? History of colon polyps 06/26/2020  ? History of renal stone 09/04/2019  ? Medication management 02/16/2018  ? Osteopenia 01/10/2018  ? Acute renal failure superimposed on stage 3b chronic kidney disease ([place]) 11/10/2017  ? 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 Hyperlipidemia, mixed 06/01/2017  ? Abnormal glucose 06/01/2017  ? Vitamin D deficiency 06/01/2017  ? Epilepsy, grand mal ([place]) 11/15/2016  ? 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 Headache, unspecified headache type 04/14/2016  ? Right hemiparesis ([place]) 12/29/2015  ? ?PCP:  Unk Pinto, MD ?Pharmacy:   ?[facility] [place], [place] - [address] ?[address] ?[place] [address] ?Phone: [phone] Fax: [phone] ? ? ? ? ?Social Determinants of Health (SDOH) Interventions ?  ? ?Readmission Risk Interventions ?No flowsheet data found. ? ? ?

## 2021-07-10 NOTE — Evaluation (Signed)
Physical Therapy Evaluation Patient Details Name: Tonya Thompson MRN: [id number] DOB: March 15, 1956 Today's Date: 07/10/2021  History of Present Illness  66 y.o. 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 Patient s/p R IM nail on 07/09/21 d/t IT fx  Clinical Impression  Pt admitted with above diagnosis.  Patient required total assist for bed mobility today and limited by pain. With sitting EOB patient exhibiting odd muscle tone (flexor tone LLE-?)  with inability to extend  left hip to get foot to the ground, some left lateral flexion of her neck and trunk. Pt denies any baseline L sided deficits. Therapist could move neck to neutral and tilt her head down but patient would return to position with neck somewhat extended. She exhibited significantly poor balance with a posterior lean and required max assist to sit edge of bed. She was returned to supine for safety. She exhibits some expressive difficulties and needs increased time to speak and process questions. One episode of pt staring blankly at therapist with eyes tilted upward until therapist tapped her cheek (approx 5-6 seconds) and then she responded . RN informed of patient's behaviors.  Recommend SNF post acute    Pt currently with functional limitations due to the deficits listed below (see PT Problem List). Pt will benefit from skilled PT to increase their independence and safety with mobility to allow discharge to the venue listed below.          Recommendations for follow up therapy are one component of a multi-disciplinary discharge planning process, led by the attending physician.  Recommendations may be updated based on patient status, additional functional criteria and insurance  authorization.  Follow Up Recommendations Skilled nursing-short term rehab (<3 hours/day)    Assistance Recommended at Discharge    Patient can return home with the following  Two people to help with walking and/or transfers;Two people to help with bathing/dressing/bathroom;Direct supervision/assist for medications management;Help with stairs or ramp for entrance;Assist for transportation;Assistance with feeding;Assistance with cooking/housework;Direct supervision/assist for financial management    Equipment Recommendations None recommended by PT  Recommendations for Other Services       Functional Status Assessment Patient has had a recent decline in their functional status and demonstrates the ability to make significant improvements in function in a reasonable and predictable amount of time.     Precautions / Restrictions Precautions Precautions: Fall Precaution Comments: R hemiplegia 2* multiple CVAs, R shoulder sublux, hx of seizures Restrictions Weight Bearing Restrictions: No RLE Weight Bearing: Weight bearing as tolerated      Mobility  Bed Mobility Overal bed mobility: Needs Assistance Bed Mobility: Supine to Sit, Sit to Supine     Supine to sit: Max assist, Total assist, +2 for physical assistance, +2 for safety/equipment Sit to supine: Max assist, Total assist, +2 for physical assistance, +2 for safety/equipment   General bed mobility comments: bed pad utilized for lateral scooting to EOB. assist to progress LEs off bed and elevate trunk. assist to control descent of trunk and lift bil LEs on to bed; dependent scooting in supine, bed pad utilized    Transfers                   General transfer comment: unable    Ambulation/Gait  Stairs            Wheelchair Mobility    Modified Rankin (Stroke Patients Only)       Balance Overall balance assessment: Needs assistance Sitting-balance support: No upper extremity  supported, Feet unsupported Sitting balance-Leahy Scale: Zero   Postural control: Posterior lean, Right lateral lean     Standing balance comment: deferred/unable                             Pertinent Vitals/Pain Pain Assessment Pain Assessment: Faces Faces Pain Scale: Hurts little more Pain Location: R hip Pain Descriptors / Indicators: Grimacing, Guarding Pain Intervention(s): Limited activity within patient's tolerance, Monitored during session, Premedicated before session, Repositioned    Home Living Family/patient expects to be discharged to:: Skilled nursing facility                   Additional Comments: Patent from [facility]. .    Prior Function Prior Level of Function : Independent/Modified Independent             Mobility Comments: uses rollator with one hand ADLs Comments: Predominantly independent - needs assistance for socks, compression hose and randomly for pants but otherwise independent     Hand Dominance   Dominant Hand: Left    Extremity/Trunk Assessment   Upper Extremity Assessment Upper Extremity Assessment: Defer to OT evaluation RUE Deficits / Details: No active movement, shoulder sublux, extensor pattern with wrist and fingers flexed, some elbwo PROM. RUE Sensation: decreased light touch (Impaired sensation) RUE Coordination: decreased fine motor;decreased gross motor LUE Deficits / Details: WFL ROM and grossly 4/5 strength LUE Sensation: WNL LUE Coordination: WNL    Lower Extremity Assessment Lower Extremity Assessment: RLE deficits/detail;LLE deficits/detail RLE Deficits / Details: PROM ankle to neutral with incr time. knee and hip AAROM grossly WFL however ltd by pain. strength 2/5 grossly, ltd by pain, incr tone RLE: Unable to fully assess due to pain RLE Coordination: decreased fine motor;decreased gross motor LLE Deficits / Details: noted flexor pattern/incr tone in sitting position; AAROM grossly WFL in  supine position, strength at least 3/5 LLE Coordination: decreased gross motor    Cervical / Trunk Assessment Cervical / Trunk Assessment: Normal (Patient exhibiting odd neck posturing - left lateral tilt and next extended. Can be brought out of it.)  Communication   Communication: Expressive difficulties  Cognition Arousal/Alertness: Awake/alert Behavior During Therapy: WFL for tasks assessed/performed Overall Cognitive Status: Within Functional Limits for tasks assessed                                 General Comments: Has some expressive difficulties. needs increased time to speak and respond. HOH.        General Comments      Exercises     Assessment/Plan    PT Assessment    PT Problem List         PT Treatment Interventions      PT Goals (Current goals can be found in the Care Plan section)  Acute Rehab PT Goals PT Goal Formulation: With patient Time For Goal Achievement: 07/24/21 Potential to Achieve Goals: Poor    Frequency       Co-evaluation PT/OT/SLP Co-Evaluation/Treatment: Yes Reason for Co-Treatment: Complexity of the patient's impairments (multi-system involvement);For patient/therapist safety PT goals addressed during session: Mobility/safety with mobility;Balance OT goals addressed during session: ADL's  and self-care       AM-PAC PT "6 Clicks" Mobility  Outcome Measure Help needed turning from your back to your side while in a flat bed without using bedrails?: Total Help needed moving from lying on your back to sitting on the side of a flat bed without using bedrails?: Total Help needed moving to and from a bed to a chair (including a wheelchair)?: Total Help needed standing up from a chair using your arms (e.g., wheelchair or bedside chair)?: Total Help needed to walk in hospital room?: Total Help needed climbing 3-5 steps with a railing? : Total 6 Click Score: 6    End of Session   Activity Tolerance: Patient limited by  fatigue;Patient limited by pain Patient left: in bed;with call bell/phone within reach;with bed alarm set Nurse Communication: Mobility status (possible mild sz activity--??) PT Visit Diagnosis: Other abnormalities of gait and mobility (R26.89);Other symptoms and signs involving the nervous system (R29.898);Difficulty in walking, not elsewhere classified (R26.2);Muscle weakness (generalized) (M62.81);History of falling (Z91.81)    Time: 2297-9892 PT Time Calculation (min) (ACUTE ONLY): 19 min   Charges:   PT Evaluation $PT Eval Moderate Complexity: [place], PT  Acute Rehab Dept ([place]) [phone] Pager [phone]  07/10/2021   [facility] 07/10/2021, 11:41 AM

## 2021-07-10 NOTE — Progress Notes (Signed)
?   07/10/21 0432  ?Assess: MEWS Score  ?Temp 97.8 ?F (36.6 ?C)  ?BP 121/78  ?Pulse Rate 99  ?Resp 14  ?SpO2 95 %  ?O2 Device Room Air  ?Assess: MEWS Score  ?MEWS Temp 0  ?MEWS Systolic 0  ?MEWS Pulse 0  ?MEWS RR 0  ?MEWS LOC 0  ?MEWS Score 0  ?MEWS Score Color Green  ?Assess: SIRS CRITERIA  ?SIRS Temperature  0  ?SIRS Pulse 1  ?SIRS Respirations  0  ?SIRS WBC 0  ?SIRS Score Sum  1  ? ?Reataken on good arm ? ?

## 2021-07-11 DIAGNOSIS — S72141A Displaced intertrochanteric fracture of right femur, initial encounter for closed fracture: Secondary | ICD-10-CM | POA: Diagnosis not present

## 2021-07-11 DIAGNOSIS — D72829 Elevated white blood cell count, unspecified: Secondary | ICD-10-CM

## 2021-07-11 LAB — CBC
HCT: 34.8 % — ABNORMAL LOW (ref 36.0–46.0)
Hemoglobin: 12.3 g/dL (ref 12.0–15.0)
MCH: 36.4 pg — ABNORMAL HIGH (ref 26.0–34.0)
MCHC: 35.3 g/dL (ref 30.0–36.0)
MCV: 103 fL — ABNORMAL HIGH (ref 80.0–100.0)
Platelets: 73 10*3/uL — ABNORMAL LOW (ref 150–400)
RBC: 3.38 MIL/uL — ABNORMAL LOW (ref 3.87–5.11)
RDW: 12.4 % (ref 11.5–15.5)
WBC: 17.4 10*3/uL — ABNORMAL HIGH (ref 4.0–10.5)
nRBC: 0 % (ref 0.0–0.2)

## 2021-07-11 LAB — BASIC METABOLIC PANEL
Anion gap: 7 (ref 5–15)
BUN: 38 mg/dL — ABNORMAL HIGH (ref 8–23)
CO2: 19 mmol/L — ABNORMAL LOW (ref 22–32)
Calcium: 8.2 mg/dL — ABNORMAL LOW (ref 8.9–10.3)
Chloride: 109 mmol/L (ref 98–111)
Creatinine, Ser: 1.1 mg/dL — ABNORMAL HIGH (ref 0.44–1.00)
GFR, Estimated: 56 mL/min — ABNORMAL LOW (ref >60)
Glucose, Bld: 80 mg/dL (ref 70–99)
Potassium: 4.3 mmol/L (ref 3.5–5.1)
Sodium: 135 mmol/L (ref 135–145)

## 2021-07-11 LAB — MAGNESIUM: Magnesium: 2 mg/dL (ref 1.7–2.4)

## 2021-07-11 NOTE — NC FL2 (Signed)
?[place] MEDICAID FL2 LEVEL OF CARE SCREENING TOOL  ?  ? ?IDENTIFICATION  ?Patient Name: ?Tonya Thompson Birthdate: May 09, 1955 Sex: female Admission Date (Current Location): ?07/08/2021  ?[place] and [place] Number: ? [place] ?  Facility and Address:  ?[facility],  [address], [place] ?     Provider Number: ?[id number]  ?Attending Physician Name and Address:  ?Antonieta Pert, MD ? Relative Name and Phone Number:  ?Renie Ora Relative [phone]  [phone]  [place] Sister [phone]  [phone] ?   ?Current Level of Care: ?Hospital Recommended Level of Care: ?[place] Prior Approval Number: ?  ? ?Date Approved/Denied: ?  PASRR Number: ?[id number] A ? ?Discharge Plan: ?SNF ([place]) ?  ? ?Current Diagnoses: ?Patient Active Problem List  ? Diagnosis Date Noted  ? Leukocytosis 07/11/2021  ? Malnutrition of moderate degree 07/10/2021  ? Metabolic acidosis 97/41/6384  ? Thrombocytopenia ([place]) 07/09/2021  ? Closed intertrochanteric fracture of right femur ([place]) 07/08/2021  ? Macrocytosis 07/08/2021  ? Abnormal LFTs 07/08/2021  ? Compression fracture of T3 vertebra ([place]) 07/08/2021  ? Impaired cognition 06/09/2021  ? Migraine 06/09/2021  ? Gait abnormality 06/09/2021  ? Coronary artery calcification 04/30/2021  ? Abnormal TSH 04/04/2021  ? Elevated LFTs 04/04/2021  ? Deterioration in renal function 04/04/2021  ? Dense breasts 12/19/2020  ? History of CVA (cerebrovascular accident) 12/18/2020  ? Pulmonary nodules 12/18/2020  ? History of colon polyps 06/26/2020  ? History of renal stone 09/04/2019  ? Medication management 02/16/2018  ? Osteopenia 01/10/2018  ? Acute renal failure superimposed on stage 3b chronic kidney disease ([place]) 11/10/2017  ? 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 Hyperlipidemia, mixed 06/01/2017  ? Abnormal glucose 06/01/2017  ? Vitamin D deficiency 06/01/2017  ? Epilepsy, grand mal ([place]) 11/15/2016  ? 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 Headache, unspecified headache type 04/14/2016  ? Right hemiparesis ([place]) 12/29/2015  ? ? ?Orientation RESPIRATION BLADDER Height & Weight   ?  ?Self, Situation, Place, Time ? Normal External catheter, Incontinent Weight: 144 lb (65.3 kg) ?Height:  5' 5.5" (166.4 cm)  ?BEHAVIORAL SYMPTOMS/MOOD NEUROLOGICAL BOWEL NUTRITION STATUS  ?    Continent Diet (Regular diet)  ?AMBULATORY STATUS COMMUNICATION OF NEEDS Skin   ?Limited Assist Verbally Surgical wounds (R Hip) ?  ?  ?  ?    ?     ?     ? ? ?Personal Care Assistance Level of Assistance  ?Bathing, Feeding, Dressing Bathing Assistance: Limited assistance ?Feeding assistance: Limited assistance ?Dressing Assistance: Limited assistance ?   ? ?Functional Limitations Info  ?Sight, Hearing, Speech Sight Info: Adequate ?Hearing Info: Adequate ?Speech Info: Adequate  ? ? ?SPECIAL CARE FACTORS FREQUENCY  ?PT (By licensed PT), OT (By licensed OT)   ?  ?PT Frequency: Minimum 5x a week ?OT Frequency: Minimum 5x a week ?  ?  ?  ?   ? ? ?Contractures Contractures Info: Not present  ? ? ?Additional Factors Info  ?Code Status, Allergies, Psychotropic Code Status Info: Full Code ?Allergies Info: Aspirin   Cheese   Chocolate   Coffee Flavor   Other   Red Wine Complex ([place]) ?Psychotropic Info: divalproex (DEPAKOTE ER) 24 hr tablet 1,000 mg ?  ?  ?   ? ?Current Medications (07/11/2021):  This is the current hospital active medication list ?Current Facility-Administered Medications  ?Medication Dose Route Frequency Provider Last Rate Last Admin  ? acetaminophen (TYLENOL) tablet 650 mg  650 mg Oral  Q6H PRN Reubin Milan, MD      ? Or  ? acetaminophen (TYLENOL) suppository 650 mg  650 mg Rectal Q6H PRN Reubin Milan, MD      ? acetaminophen (TYLENOL) tablet 1,000 mg  1,000 mg Oral Once Reubin Milan, MD      ? baclofen (LIORESAL) tablet 10 mg  10 mg Oral TID PRN Rod Can, MD   10 mg at 07/09/21 2257  ? divalproex  (DEPAKOTE ER) 24 hr tablet 1,000 mg  1,000 mg Oral QHS Reubin Milan, MD   1,000 mg at 07/10/21 2232  ? docusate sodium (COLACE) capsule 100 mg  100 mg Oral BID Rod Can, MD   100 mg at 07/11/21 1042  ? enoxaparin (LOVENOX) injection 30 mg  30 mg Subcutaneous Q24H Rod Can, MD   30 mg at 07/11/21 0839  ? feeding supplement (ENSURE ENLIVE / ENSURE PLUS) liquid 237 mL  237 mL Oral BID BM Rod Can, MD   237 mL at 07/11/21 1043  ? ketorolac (TORADOL) 15 MG/ML injection 15 mg  15 mg Intravenous Q8H PRN Kc, Ramesh, MD      ? lip balm (CARMEX) ointment 1 application.  1 application. Topical PRN Antonieta Pert, MD      ? menthol-cetylpyridinium (CEPACOL) lozenge 3 mg  1 lozenge Oral PRN Swinteck, Aaron Edelman, MD      ? Or  ? phenol (CHLORASEPTIC) mouth spray 1 spray  1 spray Mouth/Throat PRN Swinteck, Aaron Edelman, MD      ? metoCLOPramide (REGLAN) tablet 5-10 mg  5-10 mg Oral Q8H PRN Swinteck, Aaron Edelman, MD      ? Or  ? metoCLOPramide (REGLAN) injection 5-10 mg  5-10 mg Intravenous Q8H PRN Swinteck, Aaron Edelman, MD      ? multivitamin with minerals tablet 1 tablet  1 tablet Oral Daily Swinteck, Aaron Edelman, MD   1 tablet at 07/11/21 1042  ? mupirocin ointment (BACTROBAN) 2 % 1 application.  1 application. Nasal BID Rod Can, MD   1 application. at 07/11/21 1043  ? ondansetron (ZOFRAN) tablet 4 mg  4 mg Oral Q6H PRN Reubin Milan, MD      ? Or  ? ondansetron [facility]) injection 4 mg  4 mg Intravenous Q6H PRN Reubin Milan, MD      ? ondansetron [facility]) tablet 4 mg  4 mg Oral Q6H PRN Rod Can, MD      ? Or  ? ondansetron (ZOFRAN) injection 4 mg  4 mg Intravenous Q6H PRN Swinteck, Aaron Edelman, MD      ? polyethylene glycol (MIRALAX / GLYCOLAX) packet 17 g  17 g Oral Daily PRN Swinteck, Aaron Edelman, MD      ? povidone-iodine 10 % swab 2 application.  2 application. Topical Once Reubin Milan, MD      ? senna Donavan Burnet) tablet 8.6 mg  1 tablet Oral BID Rod Can, MD   8.6 mg at 07/11/21 1042  ? topiramate  (TOPAMAX) tablet 300 mg  300 mg Oral BID Rod Can, MD   300 mg at 07/11/21 1042  ? ? ? ?Discharge Medications: ?Please see discharge summary for a list of discharge medications. ? ?Relevant Imaging Results: ? ?Relevant Lab Results: ? ? ?Additional Information ?SSN [id number] ? ?Ross Ludwig, LCSW ? ? ? ? ?

## 2021-07-11 NOTE — Assessment & Plan Note (Addendum)
Postop leukocytosis likely reactive, no fever, no evidence of UTI.  Monitor CBC in 1 week ?Recent Labs  ?Lab 07/08/21 ?1052 07/09/21 ?0456 07/10/21 ?0458 07/11/21 ?0600 07/12/21 ?6578  ?WBC 6.5 9.0 17.4* 17.4* 16.1*  ? ?

## 2021-07-11 NOTE — Progress Notes (Signed)
PROGRESS NOTE Tonya Thompson  MLY:[id number] DOB: 10/10/1955 DOA: 07/08/2021 PCP: Unk Pinto, MD   Brief Narrative/Hospital Course: 66 y.o. 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  Portable chest radiograph with no acute cardiopulmonary pathology.  Right femur and pelvic x-ray showed right femur intertrochanteric fracture.  Thoracic spine show a new endplate compression deformity of T3 which is new since 04/15/2021.  She has also showed demineralization with dextroconvex thoracic scoliosis.  No acute findings on lumbosacral spine. Orthopedic was consulted and admitted.  At baseline patient has right-sided upper extremity weakness contracture but fairly ambulatory.Patient was taken to the OR 3/9 underwent operative intervention by Dr. Lyla Glassing.  Postop patient did fairly well needing pain medication appeared sleepy possible seizure as she was found to have" spaced out" but no postictal.personally discussed with Dr. Rory Percy 3/10 evening and advised valproate level  and is stable, ammonia level slightly high and if she has recurrent episode will need EEG but no recurrence since stopping opiates.  PT OT advised  SNF,at this time she is medically stable for discharge to SNF once bed available    Subjective: Seen and examined this morning.  Hard of hearing, otherwise no complaints much more alert awake oriented.  No recurrence of spaced out episode Pain is controlled.    Assessment and Plan: * Closed intertrochanteric fracture of right femur ([place]) 2/2 fall.S/P operative intervention 3/9 by Dr. Lyla Glassing. Postop  doing well pain control on oral regimen minimize narcotic due to her seizure disorder sleepiness.  Placed on Lovenox per orthopedics for DVT prophylaxis.  PT OT has advised skilled nursing facility and [facility] consulted for [facility] but SNF part.   Coronary artery calcification No acute finding on echo,ekg non ischemic.  History of AVM in the brain and not on antiplatelets  Acute renal failure superimposed on stage 3b chronic kidney disease (HCC) Creat remains at baseline-to improve level of 1.1, will stop IV fluids.Cont to monitor Recent Labs  Lab 07/08/21 1052 07/09/21 0456 07/10/21 0458 07/11/21 0600  BUN 26* 29* 39* 38*  CREATININE 1.32* 1.47* 1.42* 1.10*    Right hemiparesis (HCC) History of multiple CVAs: LDL 85.  Not on antiplatelets due to thrombocytopenia/ and AVMs  Leukocytosis Postop leukocytosis likely reactive, no fever, no evidence of UTI.  Monitor Recent Labs  Lab 07/08/21 1052 07/09/21 0456 07/10/21 0458 07/11/21 0600  WBC 6.5 9.0 17.4* 81.2*    Metabolic acidosis Bicarb improving encourage p.o. intake stop IV fluids   Malnutrition of moderate degree Augment nutritional status appreciate dietitian input Nutrition Status: Nutrition Problem: Moderate Malnutrition Etiology: chronic illness (h/o multiple CVAs, right hemiparesis) Signs/Symptoms: moderate fat depletion, moderate muscle depletion Interventions: MVI, Ensure Enlive (each supplement provides 350kcal and 20 grams of protein)    Thrombocytopenia (HCC) Platelet downtrending to 16 K but now uptrending,Monitor WHILE ON LOVENOX.was in normal range in December 2022 Recent Labs  Lab 07/08/21 1052 07/09/21 0456 07/10/21 0458 07/11/21 0600  PLT 93* 91* 68* 73*    Compression fracture of T3 vertebra (HCC) Osteopenia: Xray Incidentally showed endplate compression deformity of T3 with demineralization. No significant tenderness on palpation in the back  she had some pain in  epigastrium./Adjacent chest-has resolved.   Abnormal LFTs Resolved.  Macrocytosis B12 and folate are normal.  Monitor hemoglobin to watch for postop blood loss anemia  Osteopenia See above  Hyperlipidemia, mixed Stable ldl  Epilepsy, grand mal ([place]) History of seizure disorder-grand mal, partial seizure on Depakote and Topamax,  3/10 evening appeared sleepy possible seizure as she was found to have" spaced out" but no postictal.personally discussed with Dr. Rory Percy 3/10 evening and advised valproate level  and is stable, ammonia level slightly high and if she has recurrent episode will need EEG but no recurrence since stopping opiates.   DVT prophylaxis: enoxaparin (LOVENOX) injection 30 mg Start: 07/10/21 0800 SCDs Start: 07/09/21 2017 SCDs Start: 07/08/21 1354 Code Status:   Code Status: Full Code Family Communication: plan of care discussed with patient at bedside.  Disposition: Currently not medically stable for discharge. Status is: Inpatient Remains inpatient appropriate because: For ongoing intervention of hip fracture.  Anticipate discharge to skilled nursing facility next day if bed available Objective: Vitals last 24 hrs: Vitals:   07/10/21 2040 07/10/21 2240 07/11/21 0245 07/11/21 0656  BP:  (!) 143/82 (!) 152/89 (!) 151/79  Pulse:  (!) 121 (!) 108 (!) 104  Resp: '12 14 16 14  '$ Temp:  (!) 97.5 F (36.4 C) 97.7 F (36.5 C) 97.6 F (36.4 C)  TempSrc:  Oral Oral Oral  SpO2:  96% 100% 99%  Weight:      Height:       Weight change:   Physical Examination: General exam: AA, hard of hearing,older than stated age, weak appearing. HEENT:Oral mucosa moist, Ear/Nose WNL grossly, dentition normal. Respiratory system: bilaterally diminished,no use of accessory muscle Cardiovascular system: S1 & S2 +, No JVD,. Gastrointestinal system: Abdomen soft,NT,ND, BS+ Nervous System:Alert, awake, moving extremities and grossly nonfocal Extremities: edema neg,distal peripheral pulses  palpable.  Skin: No rashes,no icterus. MSK: Normal muscle bulk,tone, power.  Right hip surgical site with Aquacel dressing in place tender to palpation.  Medications reviewed:  Scheduled Meds:  acetaminophen  1,000 mg Oral Once   divalproex  1,000 mg Oral QHS   docusate sodium  100 mg Oral BID   enoxaparin (LOVENOX) injection  30 mg Subcutaneous Q24H   feeding supplement  237 mL Oral BID BM   multivitamin with minerals  1 tablet Oral Daily   mupirocin ointment  1 application. Nasal BID   povidone-iodine  2 application. Topical Once   senna  1 tablet Oral BID   topiramate  300 mg Oral BID   Continuous Infusions:      Diet Order             Diet regular Room service appropriate? Yes; Fluid consistency: Thin  Diet effective now                    Nutrition Problem: Moderate Malnutrition Etiology: chronic illness (h/o multiple CVAs, right hemiparesis) Signs/Symptoms: moderate fat depletion, moderate muscle depletion Interventions: MVI, Ensure Enlive (each supplement provides 350kcal and 20 grams of protein)   Intake/Output Summary (Last 24 hours) at 07/11/2021 1136 Last data filed at 07/11/2021 0900 Gross per 24 hour  Intake 2195.74 ml  Output 700 ml  Net 1495.74 ml   Net IO Since Admission: 4,105.53 mL [07/11/21 1136]  Wt Readings from Last 3 Encounters:  07/08/21 65.3 kg  06/17/21 67 kg  05/22/21 67.3 kg     Unresulted Labs (From admission, onward)     Start  Ordered   07/16/21 0500  Creatinine, serum  (enoxaparin (LOVENOX)  CrCl < 30 mL/min)  Weekly,   R     Comments: while on enoxaparin therapy.   Question:  Specimen collection method  Answer:  Lab=Lab collect   07/09/21 2017   07/10/21 0500  CBC  Daily,   R     Question:  Specimen collection method  Answer:  Lab=Lab collect   07/09/21 2017          Data Reviewed: I have personally reviewed following labs and imaging studies CBC: Recent Labs  Lab 07/08/21 1052 07/09/21 0456 07/10/21 0458  07/11/21 0600  WBC 6.5 9.0 17.4* 17.4*  NEUTROABS 5.0  --   --   --   HGB 15.3* 14.5 13.4 12.3  HCT 45.3 42.5 40.2 34.8*  MCV 106.3* 105.7* 108.9* 103.0*  PLT 93* 91* 68* 73*   Basic Metabolic Panel: Recent Labs  Lab 07/08/21 1052 07/09/21 0456 07/10/21 0458 07/11/21 0600  NA 139 137 136 135  K 3.9 3.8 3.9 4.3  CL 106 109 109 109  CO2 27 20* 17* 19*  GLUCOSE 99 153* 134* 80  BUN 26* 29* 39* 38*  CREATININE 1.32* 1.47* 1.42* 1.10*  CALCIUM 9.4 8.9 8.3* 8.2*  MG 2.4  --   --  2.0  PHOS 4.2  --   --   --    GFR: Estimated Creatinine Clearance: 48.1 mL/min (A) (by C-G formula based on SCr of 1.1 mg/dL (H)). Liver Function Tests: Recent Labs  Lab 07/08/21 1052 07/09/21 0456  AST 57* 41  ALT 34 28  ALKPHOS 80 63  BILITOT 0.7 0.6  PROT 6.5 5.6*  ALBUMIN 3.3* 2.8*   No results for input(s): LIPASE, AMYLASE in the last 168 hours. Recent Labs  Lab 07/10/21 1828  AMMONIA 76*   Coagulation Profile: No results for input(s): INR, PROTIME in the last 168 hours. Cardiac Enzymes: No results for input(s): CKTOTAL, CKMB, CKMBINDEX, TROPONINI in the last 168 hours. BNP (last 3 results) No results for input(s): PROBNP in the last 8760 hours. HbA1C: No results for input(s): HGBA1C in the last 72 hours. CBG: Recent Labs  Lab 07/09/21 1544 07/09/21 1815  GLUCAP 118* 127*   Lipid Profile: Recent Labs    07/09/21 0456  CHOL 158  HDL 58  LDLCALC 85  TRIG 74  CHOLHDL 2.7   Thyroid Function Tests: No results for input(s): TSH, T4TOTAL, FREET4, T3FREE, THYROIDAB in the last 72 hours. Anemia Panel: Recent Labs    07/09/21 0456  VITAMINB12 853  FOLATE 45.2   Sepsis Labs: No results for input(s): PROCALCITON, LATICACIDVEN in the last 168 hours.  Recent Results (from the past 240 hour(s))  Resp Panel by RT-PCR (Flu A&B, Covid) Nasopharyngeal Swab     Status: None   Collection Time: 07/08/21 11:26 AM   Specimen: Nasopharyngeal Swab; Nasopharyngeal(NP) swabs in vial  transport medium  Result Value Ref Range Status   SARS Coronavirus 2 by RT PCR NEGATIVE NEGATIVE Final    Comment: (NOTE) SARS-CoV-2 target nucleic acids are NOT DETECTED.  The SARS-CoV-2 RNA is generally detectable in upper respiratory specimens during the acute phase of infection. The lowest concentration of SARS-CoV-2 viral copies this assay can detect is 138 copies/mL. A negative result does not preclude SARS-Cov-2 infection and should not be used as the sole basis for treatment or other patient management decisions. A negative result may occur with  improper specimen collection/handling, submission of specimen other than nasopharyngeal  swab, presence of viral mutation(s) within the areas targeted by this assay, and inadequate number of viral copies(<138 copies/mL). A negative result must be combined with clinical observations, patient history, and epidemiological information. The expected result is Negative.  Fact Sheet for Patients:  EntrepreneurPulse.com.au  Fact Sheet for Healthcare Providers:  IncredibleEmployment.be  This test is no t yet approved or cleared by the [place] FDA and  has been authorized for detection and/or diagnosis of SARS-CoV-2 by FDA under an Emergency Use Authorization (EUA). This EUA will remain  in effect (meaning this test can be used) for the duration of the COVID-19 declaration under Section 564(b)(1) of the Act, 21 U.S.C.section 360bbb-3(b)(1), unless the authorization is terminated  or revoked sooner.       Influenza A by PCR NEGATIVE NEGATIVE Final   Influenza B by PCR NEGATIVE NEGATIVE Final    Comment: (NOTE) The Xpert Xpress SARS-CoV-2/FLU/RSV plus assay is intended as an aid in the diagnosis of influenza from Nasopharyngeal swab specimens and should not be used as a sole basis for treatment. Nasal washings and aspirates are unacceptable for Xpert Xpress SARS-CoV-2/FLU/RSV testing.  Fact  Sheet for Patients: EntrepreneurPulse.com.au  Fact Sheet for Healthcare Providers: IncredibleEmployment.be  This test is not yet approved or cleared by the [place] FDA and has been authorized for detection and/or diagnosis of SARS-CoV-2 by FDA under an Emergency Use Authorization (EUA). This EUA will remain in effect (meaning this test can be used) for the duration of the COVID-19 declaration under Section 564(b)(1) of the Act, 21 U.S.C. section 360bbb-3(b)(1), unless the authorization is terminated or revoked.  Performed at [facility], [place] [address], [place], [place] [address]   Surgical PCR screen     Status: None   Collection Time: 07/08/21  4:17 PM   Specimen: Nasal Mucosa; Nasal Swab  Result Value Ref Range Status   MRSA, PCR NEGATIVE NEGATIVE Final   Staphylococcus aureus NEGATIVE NEGATIVE Final    Comment: (NOTE) The Xpert SA Assay (FDA approved for NASAL specimens in patients 59 years of age and older), is one component of a comprehensive surveillance program. It is not intended to diagnose infection nor to guide or monitor treatment. Performed at [facility], [place] [address], [place], [place] [address]     Antimicrobials: Anti-infectives (From admission, onward)    Start     Dose/Rate Route Frequency Ordered Stop   07/09/21 2300  ceFAZolin (ANCEF) IVPB 2g/100 mL premix        2 g 200 mL/hr over 30 Minutes Intravenous Every 6 hours 07/09/21 2017 07/10/21 0713   07/09/21 1600  ceFAZolin (ANCEF) IVPB 2g/100 mL premix        2 g 200 mL/hr over 30 Minutes Intravenous On call to O.R. 07/08/21 2138 07/09/21 1710   07/08/21 1800  ceFAZolin (ANCEF) IVPB 2g/100 mL premix        2 g 200 mL/hr over 30 Minutes Intravenous On call to O.R. 07/08/21 1234 07/09/21 0559      Culture/Microbiology    Component Value Date/Time   SDES BRONCHIAL ALVEOLAR LAVAGE 04/21/2021 0945   SDES BRONCHIAL  ALVEOLAR LAVAGE 04/21/2021 0945   SPECREQUEST RUL 04/21/2021 0945   SPECREQUEST RUL 04/21/2021 0945   CULT  04/21/2021 0945    NO GROWTH 2 DAYS Performed at [facility], [place] [address], [place], [place] [address]    CULT  04/21/2021 0945    No growth aerobically or anaerobically. Performed at [facility], [place] [address] [place], [place] [address]    REPTSTATUS 04/23/2021 FINAL 04/21/2021 0945   REPTSTATUS 04/26/2021 FINAL 04/21/2021 0945    Other culture-see note  Radiology Studies: Pelvis Portable  Result Date: 07/09/2021 CLINICAL DATA:  Status post intramedullary nail. EXAM: PORTABLE PELVIS 1-2 VIEWS COMPARISON:  Pelvis x-ray 07/08/2021 FINDINGS: There is a new short stem right femoral intramedullary nail and hip screw fixating intratrochanteric fracture. Alignment is anatomic. There is lateral soft tissue swelling and air compatible with recent surgery. There is no dislocation. There are surgical clips in the pelvis. IMPRESSION: 1. ORIF right femoral intratrochanteric fracture. Alignment is anatomic. Electronically Signed   By: Ronney Asters M.D.   On: 07/09/2021 19:05   DG C-Arm 1-60 Min-No Report  Result Date: 07/09/2021 Fluoroscopy was utilized by the requesting physician.  No radiographic interpretation.   DG HIP UNILAT WITH PELVIS 2-3 VIEWS RIGHT  Result Date: 07/09/2021 CLINICAL DATA:  Intramedullary nail intertrochanteric right femur fluoroscopy EXAM: DG HIP (WITH OR WITHOUT PELVIS) 2-3V RIGHT COMPARISON:  Right femur radiographs 07/08/2021 FINDINGS: Images were performed intraoperatively without the presence of a radiologist. Total fluoroscopy images: 2 Total dose: 5.23 mGy Total fluoroscopy time: 34 seconds The patient appears to be undergoing cephalomedullary nail fixation of the prior intertrochanteric fracture. Please see intraoperative findings for further detail. IMPRESSION: Cephalomedullary nail fixation of prior intertrochanteric fracture. Electronically  Signed   By: Yvonne Kendall M.D.   On: 07/09/2021 17:55     LOS: 3 days   Antonieta Pert, MD [facility]  07/11/2021, 11:36 AM

## 2021-07-11 NOTE — TOC Progression Note (Addendum)
Transition of Care (TOC) - Progression Note  ? ? ?Patient Details  ?Name: Tonya Thompson ?MRN: [id number] ?Date of Birth: August 30, 1955 ? ?Transition of Care (TOC) CM/SW Contact  ?Ross Ludwig, LCSW ?Phone Number: ?07/11/2021, 1:30 PM ? ?Clinical Narrative:    ? ?CSW spoke to Weekend on call nurse Lenell Antu [phone] at [facility], they are checking to see if patient can be accepted over the weekend, if not it will have to be Monday.  CSW waiting for a call back. ? ?3:00pm  CSW spoke to [place] on call at [facility].  She said if patient is medically ready for discharge tomorrow, they can accept her.  She requested that the DC summary and FL2 be faxed to SNF at [phone], and to have nurse call report to [facility] Nurse, [phone].  CSW updated bedside nurse and attending physician.  CSW to continue to follow patient's progress throughout discharge planning. ? ? ?Expected Discharge Plan: [place] ?Barriers to Discharge: No Barriers Identified ? ?Expected Discharge Plan and Services ?Expected Discharge Plan: [place] ?  ?Discharge Planning Services: CM Consult ?  ?Living arrangements for the past 2 months: [place] ?                ?  ?  ?  ?  ?  ?  ?  ?  ?  ?  ? ? ?Social Determinants of Health (SDOH) Interventions ?  ? ?Readmission Risk Interventions ?No flowsheet data found. ? ?

## 2021-07-11 NOTE — Progress Notes (Signed)
Per tele report, patient had 10 beats of SVT & HR=187, now HR 110-120, [facility] notified, no orders made and continue monitoring the patient.  ?

## 2021-07-12 ENCOUNTER — Inpatient Hospital Stay (HOSPITAL_COMMUNITY)
Admission: EM | Admit: 2021-07-12 | Discharge: 2021-07-17 | Disposition: A | Discharge status: Skilled Nursing Facility | Payer: Medicare Other | Source: Skilled Nursing Facility | Attending: Internal Medicine | Admitting: Internal Medicine

## 2021-07-12 ENCOUNTER — Emergency Department (HOSPITAL_COMMUNITY): Payer: Medicare Other

## 2021-07-12 ENCOUNTER — Other Ambulatory Visit: Payer: Self-pay

## 2021-07-12 ENCOUNTER — Encounter (HOSPITAL_COMMUNITY): Payer: Self-pay | Admitting: Emergency Medicine

## 2021-07-12 DIAGNOSIS — D696 Thrombocytopenia, unspecified: Secondary | ICD-10-CM | POA: Diagnosis present

## 2021-07-12 DIAGNOSIS — Z808 Family history of malignant neoplasm of other organs or systems: Secondary | ICD-10-CM

## 2021-07-12 DIAGNOSIS — S72141A Displaced intertrochanteric fracture of right femur, initial encounter for closed fracture: Secondary | ICD-10-CM | POA: Diagnosis not present

## 2021-07-12 DIAGNOSIS — G40219 Localization-related (focal) (partial) symptomatic epilepsy and epileptic syndromes with complex partial seizures, intractable, without status epilepticus: Secondary | ICD-10-CM | POA: Diagnosis present

## 2021-07-12 DIAGNOSIS — Z823 Family history of stroke: Secondary | ICD-10-CM

## 2021-07-12 DIAGNOSIS — E871 Hypo-osmolality and hyponatremia: Secondary | ICD-10-CM | POA: Diagnosis present

## 2021-07-12 DIAGNOSIS — Z833 Family history of diabetes mellitus: Secondary | ICD-10-CM

## 2021-07-12 DIAGNOSIS — S72141D Displaced intertrochanteric fracture of right femur, subsequent encounter for closed fracture with routine healing: Secondary | ICD-10-CM

## 2021-07-12 DIAGNOSIS — N390 Urinary tract infection, site not specified: Secondary | ICD-10-CM | POA: Diagnosis present

## 2021-07-12 DIAGNOSIS — Z83438 Family history of other disorder of lipoprotein metabolism and other lipidemia: Secondary | ICD-10-CM

## 2021-07-12 DIAGNOSIS — Z1389 Encounter for screening for other disorder: Secondary | ICD-10-CM

## 2021-07-12 DIAGNOSIS — E7220 Disorder of urea cycle metabolism, unspecified: Secondary | ICD-10-CM | POA: Diagnosis present

## 2021-07-12 DIAGNOSIS — G8191 Hemiplegia, unspecified affecting right dominant side: Secondary | ICD-10-CM

## 2021-07-12 DIAGNOSIS — Z8616 Personal history of COVID-19: Secondary | ICD-10-CM

## 2021-07-12 DIAGNOSIS — Z886 Allergy status to analgesic agent status: Secondary | ICD-10-CM

## 2021-07-12 DIAGNOSIS — Z66 Do not resuscitate: Secondary | ICD-10-CM | POA: Diagnosis not present

## 2021-07-12 DIAGNOSIS — N1832 Chronic kidney disease, stage 3b: Secondary | ICD-10-CM | POA: Diagnosis present

## 2021-07-12 DIAGNOSIS — I69351 Hemiplegia and hemiparesis following cerebral infarction affecting right dominant side: Secondary | ICD-10-CM

## 2021-07-12 DIAGNOSIS — K219 Gastro-esophageal reflux disease without esophagitis: Secondary | ICD-10-CM | POA: Diagnosis present

## 2021-07-12 DIAGNOSIS — G928 Other toxic encephalopathy: Secondary | ICD-10-CM | POA: Diagnosis present

## 2021-07-12 DIAGNOSIS — Z8249 Family history of ischemic heart disease and other diseases of the circulatory system: Secondary | ICD-10-CM

## 2021-07-12 DIAGNOSIS — T426X5A Adverse effect of other antiepileptic and sedative-hypnotic drugs, initial encounter: Secondary | ICD-10-CM | POA: Diagnosis present

## 2021-07-12 DIAGNOSIS — D539 Nutritional anemia, unspecified: Secondary | ICD-10-CM | POA: Diagnosis present

## 2021-07-12 DIAGNOSIS — E860 Dehydration: Secondary | ICD-10-CM | POA: Diagnosis present

## 2021-07-12 DIAGNOSIS — E872 Acidosis, unspecified: Secondary | ICD-10-CM

## 2021-07-12 DIAGNOSIS — D62 Acute posthemorrhagic anemia: Secondary | ICD-10-CM | POA: Diagnosis present

## 2021-07-12 DIAGNOSIS — Z79899 Other long term (current) drug therapy: Secondary | ICD-10-CM

## 2021-07-12 DIAGNOSIS — B9620 Unspecified Escherichia coli [E. coli] as the cause of diseases classified elsewhere: Secondary | ICD-10-CM | POA: Diagnosis present

## 2021-07-12 DIAGNOSIS — G40409 Other generalized epilepsy and epileptic syndromes, not intractable, without status epilepticus: Secondary | ICD-10-CM | POA: Diagnosis present

## 2021-07-12 DIAGNOSIS — G9341 Metabolic encephalopathy: Secondary | ICD-10-CM

## 2021-07-12 DIAGNOSIS — Z825 Family history of asthma and other chronic lower respiratory diseases: Secondary | ICD-10-CM

## 2021-07-12 DIAGNOSIS — E876 Hypokalemia: Secondary | ICD-10-CM | POA: Diagnosis present

## 2021-07-12 DIAGNOSIS — W19XXXD Unspecified fall, subsequent encounter: Secondary | ICD-10-CM | POA: Diagnosis present

## 2021-07-12 DIAGNOSIS — G40909 Epilepsy, unspecified, not intractable, without status epilepticus: Secondary | ICD-10-CM

## 2021-07-12 DIAGNOSIS — R410 Disorientation, unspecified: Principal | ICD-10-CM

## 2021-07-12 DIAGNOSIS — Z91018 Allergy to other foods: Secondary | ICD-10-CM

## 2021-07-12 DIAGNOSIS — I2510 Atherosclerotic heart disease of native coronary artery without angina pectoris: Secondary | ICD-10-CM | POA: Diagnosis present

## 2021-07-12 LAB — COMPREHENSIVE METABOLIC PANEL
ALT: 10 U/L (ref 0–44)
AST: 31 U/L (ref 15–41)
Albumin: 2.1 g/dL — ABNORMAL LOW (ref 3.5–5.0)
Alkaline Phosphatase: 69 U/L (ref 38–126)
Anion gap: 6 (ref 5–15)
BUN: 31 mg/dL — ABNORMAL HIGH (ref 8–23)
CO2: 18 mmol/L — ABNORMAL LOW (ref 22–32)
Calcium: 7.9 mg/dL — ABNORMAL LOW (ref 8.9–10.3)
Chloride: 109 mmol/L (ref 98–111)
Creatinine, Ser: 0.92 mg/dL (ref 0.44–1.00)
GFR, Estimated: >60 mL/min (ref >60)
Glucose, Bld: 93 mg/dL (ref 70–99)
Potassium: 2.6 mmol/L — CL (ref 3.5–5.1)
Sodium: 133 mmol/L — ABNORMAL LOW (ref 135–145)
Total Bilirubin: 0.8 mg/dL (ref 0.3–1.2)
Total Protein: 4.9 g/dL — ABNORMAL LOW (ref 6.5–8.1)

## 2021-07-12 LAB — CBC WITH DIFFERENTIAL/PLATELET
Abs Immature Granulocytes: 0.08 10*3/uL — ABNORMAL HIGH (ref 0.00–0.07)
Basophils Absolute: 0 10*3/uL (ref 0.0–0.1)
Basophils Relative: 0 %
Eosinophils Absolute: 0 10*3/uL (ref 0.0–0.5)
Eosinophils Relative: 0 %
HCT: 29.6 % — ABNORMAL LOW (ref 36.0–46.0)
Hemoglobin: 9.9 g/dL — ABNORMAL LOW (ref 12.0–15.0)
Immature Granulocytes: 1 %
Lymphocytes Relative: 9 %
Lymphs Abs: 1 10*3/uL (ref 0.7–4.0)
MCH: 35.7 pg — ABNORMAL HIGH (ref 26.0–34.0)
MCHC: 33.4 g/dL (ref 30.0–36.0)
MCV: 106.9 fL — ABNORMAL HIGH (ref 80.0–100.0)
Monocytes Absolute: 1.2 10*3/uL — ABNORMAL HIGH (ref 0.1–1.0)
Monocytes Relative: 11 %
Neutro Abs: 9.1 10*3/uL — ABNORMAL HIGH (ref 1.7–7.7)
Neutrophils Relative %: 79 %
Platelets: 95 10*3/uL — ABNORMAL LOW (ref 150–400)
RBC: 2.77 MIL/uL — ABNORMAL LOW (ref 3.87–5.11)
RDW: 12.4 % (ref 11.5–15.5)
WBC: 11.3 10*3/uL — ABNORMAL HIGH (ref 4.0–10.5)
nRBC: 0 % (ref 0.0–0.2)

## 2021-07-12 LAB — CBC
HCT: 35.5 % — ABNORMAL LOW (ref 36.0–46.0)
Hemoglobin: 12.8 g/dL (ref 12.0–15.0)
MCH: 36.1 pg — ABNORMAL HIGH (ref 26.0–34.0)
MCHC: 36.1 g/dL — ABNORMAL HIGH (ref 30.0–36.0)
MCV: 100 fL (ref 80.0–100.0)
Platelets: 98 10*3/uL — ABNORMAL LOW (ref 150–400)
RBC: 3.55 MIL/uL — ABNORMAL LOW (ref 3.87–5.11)
RDW: 12.2 % (ref 11.5–15.5)
WBC: 16.1 10*3/uL — ABNORMAL HIGH (ref 4.0–10.5)
nRBC: 0 % (ref 0.0–0.2)

## 2021-07-12 LAB — URINALYSIS, ROUTINE W REFLEX MICROSCOPIC
Bilirubin Urine: NEGATIVE
Glucose, UA: NEGATIVE mg/dL
Ketones, ur: NEGATIVE mg/dL
Nitrite: NEGATIVE
Protein, ur: NEGATIVE mg/dL
Specific Gravity, Urine: 1.01 (ref 1.005–1.030)
pH: 7 (ref 5.0–8.0)

## 2021-07-12 LAB — CK: Total CK: 41 U/L (ref 38–234)

## 2021-07-12 LAB — PHOSPHORUS: Phosphorus: 1.9 mg/dL — ABNORMAL LOW (ref 2.5–4.6)

## 2021-07-12 LAB — VALPROIC ACID LEVEL: Valproic Acid Lvl: 94 ug/mL (ref 50.0–100.0)

## 2021-07-12 LAB — SARS CORONAVIRUS 2 (TAT 6-24 HRS): SARS Coronavirus 2: NEGATIVE

## 2021-07-12 LAB — URINALYSIS, MICROSCOPIC (REFLEX)

## 2021-07-12 LAB — MAGNESIUM: Magnesium: 1.9 mg/dL (ref 1.7–2.4)

## 2021-07-12 LAB — ETHANOL: Alcohol, Ethyl (B): <10 mg/dL (ref <10)

## 2021-07-12 MED ORDER — SODIUM CHLORIDE 0.9 % IV BOLUS
1000.0000 mL | Freq: Once | INTRAVENOUS | Status: AC
  Administered 2021-07-12: 1000 mL via INTRAVENOUS

## 2021-07-12 MED ORDER — KCL IN DEXTROSE-NACL 30-5-0.45 MEQ/L-%-% IV SOLN
INTRAVENOUS | Status: DC
  Filled 2021-07-12: qty 1000

## 2021-07-12 MED ORDER — POTASSIUM CHLORIDE IN NACL 20-0.9 MEQ/L-% IV SOLN
Freq: Once | INTRAVENOUS | Status: AC
Start: 2021-07-12 — End: 2021-07-12
  Filled 2021-07-12: qty 1000

## 2021-07-12 MED ORDER — LEVETIRACETAM IN NACL 1500 MG/100ML IV SOLN
1500.0000 mg | Freq: Once | INTRAVENOUS | Status: AC
  Administered 2021-07-12: 1500 mg via INTRAVENOUS
  Filled 2021-07-12: qty 100

## 2021-07-12 MED ORDER — ENSURE ENLIVE PO LIQD: 237.0000 mL | Freq: Two times a day (BID) | ORAL | 0 refills | Status: AC

## 2021-07-12 MED ORDER — ONDANSETRON HCL 4 MG/2ML IJ SOLN: 4.0000 mg | Freq: Four times a day (QID) | INTRAMUSCULAR | Status: DC | PRN

## 2021-07-12 MED ORDER — VALPROATE SODIUM 100 MG/ML IV SOLN
500.0000 mg | Freq: Once | INTRAVENOUS | Status: AC
  Administered 2021-07-12: 500 mg via INTRAVENOUS
  Filled 2021-07-12: qty 5

## 2021-07-12 MED ORDER — POLYETHYLENE GLYCOL 3350 17 G PO PACK: 17.0000 g | PACK | Freq: Every day | ORAL | Status: DC | PRN

## 2021-07-12 MED ORDER — ACETAMINOPHEN 325 MG PO TABS
650.0000 mg | ORAL_TABLET | Freq: Four times a day (QID) | ORAL | Status: DC | PRN
  Administered 2021-07-16: 650 mg via ORAL
  Filled 2021-07-12: qty 2

## 2021-07-12 MED ORDER — SENNA 8.6 MG PO TABS
1.0000 | ORAL_TABLET | Freq: Two times a day (BID) | ORAL | 0 refills | Status: DC
Start: 2021-07-12 — End: 2022-07-01

## 2021-07-12 MED ORDER — POLYVINYL ALCOHOL 1.4 % OP SOLN
1.0000 [drp] | Freq: Two times a day (BID) | OPHTHALMIC | Status: DC
  Administered 2021-07-12 – 2021-07-17 (×10): 1 [drp] via OPHTHALMIC
  Filled 2021-07-12: qty 15

## 2021-07-12 MED ORDER — POLYETHYLENE GLYCOL 3350 17 G PO PACK: 17.0000 g | PACK | Freq: Every day | ORAL | 0 refills | Status: AC | PRN

## 2021-07-12 MED ORDER — ENOXAPARIN SODIUM 30 MG/0.3ML IJ SOSY
30.0000 mg | PREFILLED_SYRINGE | INTRAMUSCULAR | Status: DC
  Administered 2021-07-13: 30 mg via SUBCUTANEOUS
  Filled 2021-07-12: qty 0.3

## 2021-07-12 MED ORDER — SODIUM CHLORIDE 0.9 % IV SOLN: INTRAVENOUS | Status: DC

## 2021-07-12 MED ORDER — ACETAMINOPHEN 650 MG RE SUPP: 650.0000 mg | Freq: Four times a day (QID) | RECTAL | Status: DC | PRN

## 2021-07-12 MED ORDER — POTASSIUM CHLORIDE 2 MEQ/ML IV SOLN
INTRAVENOUS | Status: AC
  Filled 2021-07-12 (×3): qty 1000

## 2021-07-12 MED ORDER — ONDANSETRON HCL 4 MG PO TABS: 4.0000 mg | ORAL_TABLET | Freq: Four times a day (QID) | ORAL | Status: DC | PRN

## 2021-07-12 MED ORDER — LEVETIRACETAM IN NACL 500 MG/100ML IV SOLN
500.0000 mg | Freq: Two times a day (BID) | INTRAVENOUS | Status: DC
  Administered 2021-07-13 – 2021-07-15 (×5): 500 mg via INTRAVENOUS
  Filled 2021-07-12 (×5): qty 100

## 2021-07-12 NOTE — Plan of Care (Signed)
Pt for discharge back to [facility] this am.  ?TOC coordinating transfer.  ?AVS printed, report called to Deja, RN at receiving facility.  ?Packet completed, scripts included.  ?PTAR to transfer.  ? ?Problem: Education: ?Goal: Knowledge of General Education information will improve ?Description: Including pain rating scale, medication(s)/side effects and non-pharmacologic comfort measures ?Outcome: Adequate for Discharge ?  ?Problem: Health Behavior/Discharge Planning: ?Goal: Ability to manage health-related needs will improve ?Outcome: Adequate for Discharge ?  ?Problem: Clinical Measurements: ?Goal: Ability to maintain clinical measurements within normal limits will improve ?Outcome: Adequate for Discharge ?Goal: Will remain free from infection ?Outcome: Adequate for Discharge ?Goal: Diagnostic test results will improve ?Outcome: Adequate for Discharge ?Goal: Respiratory complications will improve ?Outcome: Adequate for Discharge ?Goal: Cardiovascular complication will be avoided ?Outcome: Adequate for Discharge ?  ?Problem: Activity: ?Goal: Risk for activity intolerance will decrease ?Outcome: Adequate for Discharge ?  ?Problem: Nutrition: ?Goal: Adequate nutrition will be maintained ?Outcome: Adequate for Discharge ?  ?Problem: Coping: ?Goal: Level of anxiety will decrease ?Outcome: Adequate for Discharge ?  ?Problem: Elimination: ?Goal: Will not experience complications related to bowel motility ?Outcome: Adequate for Discharge ?Goal: Will not experience complications related to urinary retention ?Outcome: Adequate for Discharge ?  ?Problem: Pain Managment: ?Goal: General experience of comfort will improve ?Outcome: Adequate for Discharge ?  ?Problem: Safety: ?Goal: Ability to remain free from injury will improve ?Outcome: Adequate for Discharge ?  ?Problem: Skin Integrity: ?Goal: Risk for impaired skin integrity will decrease ?Outcome: Adequate for Discharge ?  ?Problem: Education: ?Goal: Verbalization  of understanding the information provided (i.e., activity precautions, restrictions, etc) will improve ?Outcome: Adequate for Discharge ?Goal: Individualized Educational Video(s) ?Outcome: Adequate for Discharge ?  ?Problem: Activity: ?Goal: Ability to ambulate and perform ADLs will improve ?Outcome: Adequate for Discharge ?  ?Problem: Clinical Measurements: ?Goal: Postoperative complications will be avoided or minimized ?Outcome: Adequate for Discharge ?  ?Problem: Self-Concept: ?Goal: Ability to maintain and perform role responsibilities to the fullest extent possible will improve ?Outcome: Adequate for Discharge ?  ?Problem: Pain Management: ?Goal: Pain level will decrease ?Outcome: Adequate for Discharge ?  ?

## 2021-07-12 NOTE — ED Triage Notes (Signed)
BIB EMS from [facility], pt had just recently arrived an hour or so prior after being discharged from an admission upstairs. Staff reported AMS. PT HOH, does not have hearing aide, alert to self.  ?

## 2021-07-12 NOTE — H&P (Signed)
History and Physical  Tonya Thompson NUU:[id number] DOB: 1956-02-21 DOA: 07/12/2021  PCP: Unk Pinto, MD Patient coming from: SNF  I have personally briefly reviewed patient's old medical records in [place]   Chief Complaint: Acute metabolic encephalopathy  HPI: Tonya Thompson is a 66 y.o. 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  Most of the history is obtained from the chart I was unable to reach the skilled nursing facility provider, during my interview I am getting a similar evaluation the patient is only able to answer brief simple questions yes or no denies any pain.  In the ED: To be mildly tachycardic blood pressure 126/91 satting 97% on room air, she was hyponatremic with a potassium of 2.6 bicarb of 18 white count of 11 hemoglobin of 9 (hemoglobin on 07/12/2021 was 12.8), CT of the head showed no acute intracranial findings, and change from previous studies with extensive encephalomalacia of the left hemisphere.  Chest x-ray showed no acute abnormalities. The ED physician discussed the case with neurologist recommended transfer to [facility] for 24-hour EEG   Review of Systems: All systems reviewed and apart from history of presenting illness, are negative.  Past Medical History:  Diagnosis Date   Acute kidney injury ([place]) 09/03/2019   05-2019 kidney stone removed   Allergy    seasonal allergies   Arthritis    LEFT hand   AVM (arteriovenous malformation) brain    Cataract    bilateral-not a surgical candidate at this time (07/23/2020)   COVID-19 virus infection 08/2020   GERD  (gastroesophageal reflux disease)    on meds   Hyperlipidemia    diet controlled   Insulin resistance 02/16/2018   Seizures ([place])    Grand Mal & Partial complex seizure disorder-last 1998   Stroke [facility])    5 total so far (age [phone])   Ureteral stone 05/10/2019   has multiple kidney stones noted from Urologist   Uses roller walker    Past Surgical History:  Procedure Laterality Date   BRAIN SURGERY  09/19/1978   at [facility], Dr. Leida Lauth   BRONCHIAL BIOPSY  04/21/2021   Procedure: BRONCHIAL BIOPSIES;  Surgeon: Garner Nash, DO;  Location: [place] [facility]COPY;  Service: Pulmonary;;   BRONCHIAL BRUSHINGS  04/21/2021   Procedure: BRONCHIAL BRUSHINGS;  Surgeon: Garner Nash, DO;  Location: [place] [facility]COPY;  Service: Pulmonary;;   BRONCHIAL WASHINGS  04/21/2021   Procedure: BRONCHIAL WASHINGS;  Surgeon: Garner Nash, DO;  Location: [place];  Service: Pulmonary;;   COLONOSCOPY  2017   [place], [place]   CYSTOSCOPY/URETEROSCOPY/HOLMIUM LASER/STENT PLACEMENT Left 05/10/2019   Procedure: CYSTOSCOPY/RETROGRADE/URETEROSCOPY/HOLMIUM LASER/STENT PLACEMENT;  Surgeon: Raynelle Bring, MD;  Location: [facility];  Service: Urology;  Laterality: Left;   FIDUCIAL MARKER PLACEMENT  04/21/2021   Procedure: FIDUCIAL MARKER PLACEMENT;  Surgeon: Garner Nash, DO;  Location: [place]OSCOPY;  Service: Pulmonary;;   FOOT SURGERY Right 10/1985   ankle fusion, at [facility], Dr. Lorelee Cover   POLYPECTOMY  2017   multiple adenomas   [place]  04/21/2021   Procedure: RADIAL ENDOBRONCHIAL ULTRASOUND;  Surgeon:  Garner Nash, DO;  Location: [facility];  Service: Pulmonary;;   WISDOM TOOTH EXTRACTION     Social History:  reports that she has never smoked. She has been exposed to tobacco smoke. She has never used smokeless tobacco. She reports that she does not drink alcohol and does not use drugs.   Allergies  Allergen Reactions   Aspirin  Other (See Comments)    CONTRAINDICATED DUE TO HISTORY OF BLEEDING IN BRAIN   Cheese     Cant take due to history of severe migraines   Chocolate     Cant take due to history of severe migraines   Coffee Flavor     Cant take due to history of severe migraines   Other      Nuts - Cant take due to history of severe migraines   Red Wine Complex [Germanium]     Cant take due to history of severe migraines    Family History  Problem Relation Age of Onset   Heart disease Mother    Heart failure Mother    Heart attack Mother    Diabetes Father    Hyperlipidemia Father    Dementia Father    Prostate cancer Father    Melanoma Father    Colon polyps Father    Hyperlipidemia Paternal Uncle    Hypertension Paternal Uncle    Dementia Paternal Uncle    Heart attack Maternal Grandfather    Stroke Paternal Grandmother    CAD Maternal Uncle    COPD Maternal Uncle        smoker   Breast cancer Neg Hx    Colon cancer Neg Hx    Esophageal cancer Neg Hx    Stomach cancer Neg Hx    Rectal cancer Neg Hx     Prior to Admission medications   Medication Sig Start Date End Date Taking? Authorizing Provider  acetaminophen (TYLENOL) 325 MG tablet Take 650 mg by mouth every 4 (four) hours as needed for headache or mild pain.    [provider]  baclofen (LIORESAL) 10 MG tablet Take 1 tablet up to four times daily as needed Patient taking differently: Take 10 mg by mouth 4 (four) times daily as needed for muscle spasms. 09/01/20   Cameron Sprang, MD  Calcium Carbonate 500 MG CHEW Chew 500 mg by mouth daily.    [provider]  Calcium Citrate-Vitamin D 315-5 MG-MCG TABS Take 1 tablet by mouth daily.    [provider]  divalproex (DEPAKOTE ER) 500 MG 24 hr tablet Take 2 tablets every night Patient taking differently: Take 1,000 mg by mouth at bedtime. 09/01/20   Cameron Sprang, MD  Docosahexaenoic Acid (DHA NATURAL OMEGA-3 PO) Take 1 capsule by mouth daily. Omega-3 DHA  (DR/EC) '350mg'$ -'325mg'$ -'90mg'$ -'597mg'$     [provider]  enoxaparin (LOVENOX) 30 MG/0.3ML injection Inject 0.3 mLs (30 mg total) into the skin daily. 07/11/21 08/10/21  Swinteck, Aaron Edelman, MD  feeding supplement (ENSURE ENLIVE / ENSURE PLUS) LIQD Take 237 mLs by mouth 2 (two) times daily between meals for 7 days. 07/12/21 07/19/21  Antonieta Pert, MD  fexofenadine (ALLEGRA) 180 MG tablet Take 180 mg by mouth daily.    [provider]  guaiFENesin (MUCINEX) 600 MG 12 hr tablet Take 600 mg by mouth 2 (two) times daily as needed for cough.    [provider]  Multiple Vitamins-Minerals (THEREMS-M) TABS Take 1 tablet by mouth daily.    [provider]  nystatin  powder Apply 1 application. topically 2 (two) times daily as needed (rash under breast).    [provider]  oxyCODONE (OXY IR/ROXICODONE) 5 MG immediate release tablet Take 1 tablet (5 mg total) by mouth every 4 (four) hours as needed for breakthrough pain ((for MODERATE breakthrough pain)). 07/10/21   Swinteck, Aaron Edelman, MD  polyethylene glycol (MIRALAX / GLYCOLAX) 17 g packet Take 17 g by mouth daily as needed for mild constipation. 07/12/21   Antonieta Pert, MD  Propylene Glycol (SYSTANE BALANCE) 0.6 % SOLN Place 1 drop into both eyes 2 (two) times daily.    [provider]  senna (SENOKOT) 8.6 MG TABS tablet Take 1 tablet (8.6 mg total) by mouth 2 (two) times daily. 07/12/21   Antonieta Pert, MD  topiramate (TOPAMAX) 200 MG tablet Take 300 mg by mouth 2 (two) times daily.    [provider]  Vitamin D3 (VITAMIN D) 25 MCG tablet Take 1,000 Units by mouth daily.    [provider]  zinc oxide 20 % ointment Apply 1 application. topically as needed (after every incontinent episode).    [provider]   Physical Exam: Vitals:   07/12/21 1530 07/12/21 1600 07/12/21 1700 07/12/21 1725  BP: 124/86 (!) 132/95 (!) 126/91   Pulse: 97 (!) 107 (!) 104 88  Resp: 15 (!) '23 18 13  '$ Temp:      SpO2: 98%  98% 99% 98%  Weight:      Height:        General exam: Moderately built and nourished patient, in no acute distress Head, eyes and ENT: Nontraumatic and normocephalic.  Neck: Supple. No JVD, carotid bruit or thyromegaly. Lymphatics: No lymphadenopathy. Respiratory system: Clear to auscultation. No increased work of breathing. Cardiovascular system: S1 and S2 heard, RRR. No JVD,  Gastrointestinal system: Abdomen is nondistended, soft and nontender.  Central nervous system: Is awake alert and oriented x1 with right-sided hemiparesis, it appears she has a left-sided hemineglect is not output open the left eye or look to the left side but has 5 out of 5 upper extremity strength 4 out of 5 in the left lower extremity she is about a 2-3 on the right. Extremities: Symmetric 5 x 5 power. Peripheral pulses symmetrically felt.  Skin: No rashes or acute findings. Musculoskeletal system: Negative exam. Psychiatry: Pleasant and cooperative.   Labs on Admission:  Basic Metabolic Panel: Recent Labs  Lab 07/08/21 1052 07/09/21 0456 07/10/21 0458 07/11/21 0600 07/12/21 1554  NA 139 137 136 135 133*  K 3.9 3.8 3.9 4.3 2.6*  CL 106 109 109 109 109  CO2 27 20* 17* 19* 18*  GLUCOSE 99 153* 134* 80 93  BUN 26* 29* 39* 38* 31*  CREATININE 1.32* 1.47* 1.42* 1.10* 0.92  CALCIUM 9.4 8.9 8.3* 8.2* 7.9*  MG 2.4  --   --  2.0  --   PHOS 4.2  --   --   --   --    Liver Function Tests: Recent Labs  Lab 07/08/21 1052 07/09/21 0456 07/12/21 1554  AST 57* 41 31  ALT 34 28 10  ALKPHOS 80 63 69  BILITOT 0.7 0.6 0.8  PROT 6.5 5.6* 4.9*  ALBUMIN 3.3* 2.8* 2.1*   No results for input(s): LIPASE, AMYLASE in the last 168 hours. Recent Labs  Lab 07/10/21 1828  AMMONIA 76*   CBC: Recent Labs  Lab 07/08/21 1052 07/09/21 0456 07/10/21 0458 07/11/21 0600 07/12/21 0539 07/12/21 1554  WBC 6.5 9.0 17.4*  17.4* 16.1* 11.3*  NEUTROABS 5.0  --   --   --   --  9.1*  HGB 15.3* 14.5 13.4 12.3 12.8 9.9*   HCT 45.3 42.5 40.2 34.8* 35.5* 29.6*  MCV 106.3* 105.7* 108.9* 103.0* 100.0 106.9*  PLT 93* 91* 68* 73* 98* 95*   Cardiac Enzymes: No results for input(s): CKTOTAL, CKMB, CKMBINDEX, TROPONINI in the last 168 hours.  BNP (last 3 results) No results for input(s): PROBNP in the last 8760 hours. CBG: Recent Labs  Lab 07/09/21 1544 07/09/21 1815  GLUCAP 118* 127*    Radiological Exams on Admission: CT HEAD WO CONTRAST  Result Date: 07/12/2021 CLINICAL DATA:  Altered mental status EXAM: CT HEAD WITHOUT CONTRAST TECHNIQUE: Contiguous axial images were obtained from the base of the skull through the vertex without intravenous contrast. RADIATION DOSE REDUCTION: This exam was performed according to the departmental dose-optimization program which includes automated exposure control, adjustment of the mA and/or kV according to patient size and/or use of iterative reconstruction technique. COMPARISON:  MR brain, 04/13/2021 FINDINGS: Brain: No evidence of acute infarction, hemorrhage, hydrocephalus, extra-axial collection or mass lesion/mass effect. 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 Vascular: No hyperdense vessel or unexpected calcification. Skull: Status post left pterional craniotomy. Negative for fracture or focal lesion. Sinuses/Orbits: No acute finding. Other: None. IMPRESSION: 1. No acute intracranial pathology. 2. 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 Electronically Signed   By: Delanna Ahmadi M.D.   On: 07/12/2021 15:13   DG Chest Port 1 View  Result Date: 07/12/2021 CLINICAL DATA:  Altered level of consciousness EXAM: PORTABLE CHEST 1 VIEW COMPARISON:  07/08/2021 FINDINGS: The heart size and mediastinal contours are within normal limits. Both lungs are clear. The visualized skeletal structures are unremarkable.  IMPRESSION: No acute abnormality of the lungs in AP portable projection. Electronically Signed   By: Delanna Ahmadi M.D.   On: 07/12/2021 14:54    EKG: Independently reviewed.  Sinus tach left axis deviation nonspecific T changes  Assessment/Plan Acute metabolic encephalopathy in the setting of partial complex seizures with intractable epilepsy and grand mal seizures: There is significant concern that she may be having a partial seizure despite of what appears to be that she was taking her medication as she was just discharged on the day of admission. Her Depakote level was 93 on the day of discharge. CT of the head done in the ED on 07/12/2021 is unchanged from previous It does appear that she is having a partial seizure, as she is neglecting her left side not able to open her left eye, we will go ahead and load her with IV Keppra, and continue her 500 mg twice a day. We will go ahead and transfer her to [facility] for 24-hour EEG.  We will place her n.p.o. Started on D5 half-normal with potassium supplementation as below for details. And for neurology to evaluate at [facility]. We will also get an MRI of the brain to rule out CVA as she appears to have a left-sided-sided hemineglect. Alcohol level is less than 10, UA from 07/10/2021 showed no signs of infection.  Hypokalemia: Change IV fluids to D5 half-normal with potassium supplementation, recheck potassium in the morning. Check a mag and phosphorus level. Replete as needed.  Leukocytosis: She has remained afebrile her white count is improved from yesterday we will check a CBC with differential  tomorrow morning she had an [place], which she has remained afebrile. We will get blood cultures. There is probably reactive due to recent surgery.  History of recent closed intertrochanteric fracture of right femur on 07/09/2021: Continue current Lovenox DVT prophylaxis. Once patient is able to stand consult physical therapy.  Coronary artery  calcification: Noted.  Normal anion gap metabolic acidosis: Check a CK and a lactic acid. Her acidosis is worsening we will fluid resuscitate her and recheck in the morning  Chronic kidney disease stage IIIa: Her creatinine appears to be improved from the day prior started on half-normal saline recheck basic metabolic panel in the morning.  Thrombocytopenia: New since 07/08/2021, has remained relatively stable at 95-98, question due to recent fracture and surgery.  Macrocytic anemia/postoperative blood loss anemia: 07/08/2021 B12 was 853, check a folate level. Check a CBC tomorrow morning  History of right hemiparesis ([place]): Noted.      DVT Prophylaxis: lovenox Code Status: Full Family Communication: None Disposition Plan: inpatient to [facility] hospital   Time spent: 95 min    It is my clinical opinion that admission to INPATIENT is reasonable and necessary in this 66 y.o. female acute encephalopathy in the setting of grand mal partial seizures who comes in with new onset of confusion.  Given the aforementioned, the predictability of an adverse outcome is felt to be significant. I expect that the patient will require at least 2 midnights in the hospital to treat this condition.  Charlynne Cousins MD [facility]   07/12/2021, 5:33 PM

## 2021-07-12 NOTE — TOC Transition Note (Addendum)
Transition of Care (TOC) - CM/SW Discharge Note ? ? ?Patient Details  ?Name: Tonya Thompson ?MRN: [id number] ?Date of Birth: 02-May-1956 ? ?Transition of Care (TOC) CM/SW Contact:  ?Ross Ludwig, LCSW ?Phone Number: ?07/12/2021, 10:40 AM ? ? ?Clinical Narrative:    ? ?CSW spoke to [place] on call nurse, [phone] and she confirmed patient can return today.  Patient to be d/c'ed today to [facility] unit.  Patient and family agreeable to plans will transport via ems RN to call report [phone] [facility] nurse.  CSW spoke to patient's family member Abigail Butts [phone].  CSW signing off, please reconsult if other social work needs arise. ? ? ? ?Final next level of care: [place] ?Barriers to Discharge: Barriers Resolved ? ? ?Patient Goals and CMS Choice ?Patient states their goals for this hospitalization and ongoing recovery are:: To go to SNF for rehab, then return back to ALF. ?CMS Medicare.gov Compare Post Acute Care list provided to:: Patient Represenative (must comment) ?Choice offered to / list presented to : [facility] POA / Guardian ? ?Discharge Placement ?PASRR number recieved: 07/10/21 ?           ?Patient chooses bed at: [facility] ?Patient to be transferred to facility by: [facility] ?Name of family member notified: Abigail Butts [phone] ?Patient and family notified of of transfer: 07/12/21 ? ?Discharge Plan and Services ?  ?Discharge Planning Services: CM Consult ?           ?  ?  ?  ?  ?  ?  ?  ?  ?  ?  ? ?Social Determinants of Health (SDOH) Interventions ?  ? ? ?Readmission Risk Interventions ?No flowsheet data found. ? ? ? ? ?

## 2021-07-12 NOTE — Progress Notes (Signed)
ON CALL PHONE CONSULT ? ?Call from Drs. Vanita Panda and [email or web address]  long ? ? ?Discussion: Recently discharged for altered mental status, upon reaching the facility found to be more altered, brought back to the emergency room.  On hospitalist evaluation question of partial seizure.  Recommended giving a load of Keppra.  Already therapeutic on Depakote with a level 95.  Transfer to [facility] as she might need continuous EEG.  Will have the night team evaluate the patient ? ?-- ?Amie Portland, MD ?Neurologist ?[facility] ?Pager: [phone] ? ?

## 2021-07-12 NOTE — Discharge Summary (Signed)
Physician Discharge Summary   Patient: Tonya Thompson MRN: [id number] DOB: Sep 18, 1955  Admit date:     07/08/2021  Discharge date: 07/12/21  Discharge Physician: Antonieta Pert   PCP: Unk Pinto, MD   Recommendations at discharge:    Dr Lyla Glassing for post op follow up for cbc while on lovenox in 5 days PCP in 1 wk CBC/BMP in 1 wk  Hospital Course: 66 y.o. 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  Portable chest radiograph with no acute cardiopulmonary pathology.  Right femur and pelvic x-ray showed right femur intertrochanteric fracture.  Thoracic spine show a new endplate compression deformity of T3 which is new since 04/15/2021.  She has also showed demineralization with dextroconvex thoracic scoliosis.  No acute findings on lumbosacral spine. Orthopedic was consulted and admitted.  At baseline patient has right-sided upper extremity weakness contracture but fairly ambulatory.Patient was taken to the OR 3/9 underwent operative intervention by Dr. Lyla Glassing.  Postop patient did fairly well needing pain medication appeared sleepy possible seizure as she was found to have" spaced out" but no postictal.personally discussed with Dr. Rory Percy 3/10 evening and advised valproate level  and is stable, ammonia level slightly high and if she has recurrent episode will need EEG but no recurrence since stopping opiates.  PT OT advised  SNF,at this time she is medically stable for discharge to SNF-back to the facility.   Discharge Diagnoses: * Closed intertrochanteric fracture of right femur ([place]) 2/2 fall.S/P operative  intervention 3/9 by Dr. Lyla Glassing. Postop doing well.  Pain is controlled.  Patient on Lovenox for DVT prophylaxis and pain control as per ortho. Cont Aquacel dressing and follow-up with orthopedics as outpatient.  Monitor CBC closely while on Lovenox given thrombocytopenia.   Coronary artery calcification No acute finding on echo,ekg non ischemic.  History of AVM in the brain and not on antiplatelets  Acute renal failure superimposed on stage 3b chronic kidney disease (HCC) Creat remains at baseline-to improve level of 1.1, encourage oral hydration Recent Labs  Lab 07/08/21 1052 07/09/21 0456 07/10/21 0458 07/11/21 0600  BUN 26* 29* 39* 38*  CREATININE 1.32* 1.47* 1.42* 1.10*    Right hemiparesis (HCC) History of multiple CVAs: LDL 85.  Not on antiplatelets due to thrombocytopenia/ and AVMs  Leukocytosis Postop leukocytosis likely reactive, no fever, no evidence of UTI.  Monitor CBC in 1 week Recent Labs  Lab 07/08/21 1052 07/09/21 0456 07/10/21 0458 07/11/21 0600 07/12/21 0539  WBC 6.5 9.0 17.4* 17.4* 46.5*    Metabolic acidosis Bicarb improving encourage p.o.  Malnutrition of moderate degree Augment nutritional status appreciate dietitian input Nutrition Status: Nutrition Problem: Moderate Malnutrition Etiology: chronic illness (h/o multiple CVAs, right hemiparesis) Signs/Symptoms: moderate fat depletion, moderate muscle depletion Interventions: MVI, Ensure Enlive (each supplement provides 350kcal and 20 grams of protein)    Thrombocytopenia (HCC) Platelet uptrending at 98 K.  Monitor CBC in 1 wk while on Lovenox.  Likely due to acute illness no other obvious etiology. was in normal range in December 2022 Recent Labs  Lab 07/08/21 1052 07/09/21 0456 07/10/21 0458 07/11/21 0600 07/12/21 0539  PLT 93* 91* 68* 73* 98*  Compression fracture of T3 vertebra (HCC) Osteopenia: Xray Incidentally showed mild endplate compression deformity of T3 with  demineralization. No significant tenderness on palpation in the back she had some pain in epigastrium.  She will need to follow-up with outpatient orthopedics/neuro.   Abnormal LFTs Resolved.  Macrocytosis B12 and folate are normal.  Monitor hemoglobin to watch for postop blood loss anemia  Osteopenia See above  Hyperlipidemia, mixed Stable ldl  Epilepsy, grand mal ([place]) History of seizure disorder-grand mal, partial seizure on Depakote and Topamax,  3/10 evening appeared sleepy possible seizure as she was found to have" spaced out" but no postictal.personally discussed with Dr. Rory Percy with neurology 3/10 evening and advised valproate level  and is stable, ammonia level slightly high-patient is doing well no recurrence of episodes.  She will continue on home regimen.        Consultants: ortho Procedures performed: s/p rt hip surgery -intramedullary fixation right femur Disposition: Skilled nursing facility Diet recommendation:  Diet Orders (From admission, onward)     Start     Ordered   07/09/21 2017  Diet regular Room service appropriate? Yes; Fluid consistency: Thin  Diet effective now       Question Answer Comment  Room service appropriate? Yes   Fluid consistency: Thin      07/09/21 2017          Subjective: Resting well, feels okay for discharge back to facility today  Physical examination: Vitals with BMI 07/12/2021 07/11/2021 07/11/2021  Height - - -  Weight - - -  BMI - - -  Systolic 287 681 157  Diastolic 93 76 85  Pulse 262 95 100    General exam: AA, hard of hearing,older than stated age, weak appearing. HEENT:Oral mucosa moist, Ear/Nose WNL grossly, dentition normal. Respiratory system: bilaterally diminished, no use of accessory muscle Cardiovascular system: S1 & S2 +, No JVD,. Gastrointestinal system: Abdomen soft,NT,ND,BS+ Nervous System:Alert, awake, moving extremities and grossly nonfocal Extremities: LE ankle edema none, rle with aquacel  dressing, distal peripheral pulses palpable.  Skin: No rashes,no icterus. MSK: Normal muscle bulk,tone, power      DISCHARGE MEDICATION: Allergies as of 07/12/2021       Reactions   Aspirin Other (See Comments)   CONTRAINDICATED DUE TO HISTORY OF BLEEDING IN BRAIN   Cheese    Cant take due to history of severe migraines   Chocolate    Cant take due to history of severe migraines   Coffee Flavor    Cant take due to history of severe migraines   Other     Nuts - Cant take due to history of severe migraines   Red Wine Complex [germanium]    Cant take due to history of severe migraines        Medication List     TAKE these medications    acetaminophen 325 MG tablet Commonly known as: TYLENOL Take 650 mg by mouth every 4 (four) hours as needed for headache or mild pain.   baclofen 10 MG tablet Commonly known as: LIORESAL Take 1 tablet up to four times daily as needed What changed:  how much to take how to take this when to take this reasons to take this additional instructions   Calcium Carbonate 500 MG Chew Chew 500 mg by mouth daily.   Calcium Citrate-Vitamin D 315-5 MG-MCG Tabs Take 1 tablet by mouth daily.   DHA NATURAL OMEGA-3 PO Take 1 capsule by mouth daily. Omega-3 DHA (DR/EC) '350mg'$ -'325mg'$ -'90mg'$ -'597mg'$   divalproex 500 MG 24 hr tablet Commonly known as: DEPAKOTE ER Take 2 tablets every night What changed:  how much to take how to take this when to take this additional instructions   enoxaparin 30 MG/0.3ML injection Commonly known as: LOVENOX Inject 0.3 mLs (30 mg total) into the skin daily.   feeding supplement Liqd Take 237 mLs by mouth 2 (two) times daily between meals for 7 days.   fexofenadine 180 MG tablet Commonly known as: ALLEGRA Take 180 mg by mouth daily.   guaiFENesin 600 MG 12 hr tablet Commonly known as: MUCINEX Take 600 mg by mouth 2 (two) times daily as needed for cough.   nystatin powder Generic drug: nystatin Apply 1  application. topically 2 (two) times daily as needed (rash under breast).   oxyCODONE 5 MG immediate release tablet Commonly known as: Oxy IR/ROXICODONE Take 1 tablet (5 mg total) by mouth every 4 (four) hours as needed for breakthrough pain ((for MODERATE breakthrough pain)).   polyethylene glycol 17 g packet Commonly known as: MIRALAX / GLYCOLAX Take 17 g by mouth daily as needed for mild constipation.   senna 8.6 MG Tabs tablet Commonly known as: SENOKOT Take 1 tablet (8.6 mg total) by mouth 2 (two) times daily.   Systane Balance 0.6 % Soln Generic drug: Propylene Glycol Place 1 drop into both eyes 2 (two) times daily.   Therems-M Tabs Take 1 tablet by mouth daily.   topiramate 200 MG tablet Commonly known as: TOPAMAX Take 300 mg by mouth 2 (two) times daily.   Vitamin D3 25 MCG tablet Commonly known as: Vitamin D Take 1,000 Units by mouth daily.   zinc oxide 20 % ointment Apply 1 application. topically as needed (after every incontinent episode).               Discharge Care Instructions  (From admission, onward)           Start     Ordered   07/12/21 0000  Discharge wound care:       Comments: Reinforce dressing until discontinued follow-up with orthopedic surgery   07/12/21 1012            Contact information for follow-up providers     Swinteck, Aaron Edelman, MD. Schedule an appointment as soon as possible for a visit in 2 week(s).   Specialty: Orthopedic Surgery Why: For wound re-check, For suture removal Contact information: [address] [place] [address] [phone]         Unk Pinto, MD Follow up in 1 week(s).   Specialty: Internal Medicine Contact information: [address] [place] [address] [phone]         Werner Lean, MD .   Specialty: Cardiology Contact information: [address] [place] [address] [phone]              Contact  information for after-discharge care     Destination     [facility] .   Service: Skilled Nursing Contact information: [place] [phone]                    Discharge Exam: Danley Danker Weights   07/08/21 0915  Weight: 65.3 kg  Condition at discharge: fair The results of significant diagnostics from this hospitalization (including imaging, microbiology, ancillary and laboratory) are listed below for reference.   Imaging Studies: DG Chest 1 View  Result Date: 07/08/2021  CLINICAL DATA:  [place] 3 hours ago, hip pain EXAM: CHEST  1 VIEW COMPARISON:  04/21/2021 FINDINGS: Normal heart size, mediastinal contours, and pulmonary vascularity. Lungs clear. Marker RIGHT upper lobe. No acute infiltrate, pleural effusion, or pneumothorax. Skin folds project over chest. Bones demineralized with chronic RIGHT glenohumeral degenerative changes. IMPRESSION: No acute abnormalities. Electronically Signed   By: Lavonia Dana M.D.   On: 07/08/2021 10:38   DG Thoracic Spine 2 View  Result Date: 07/08/2021 CLINICAL DATA:  [place] 3 hours ago EXAM: THORACIC SPINE 2 VIEWS COMPARISON:  None; correlation CT chest 04/15/2021 FINDINGS: Twelve pairs of ribs. Bones demineralized. Dextroconvex scoliosis lower thoracic spine. Mild superior endplate compression deformity of approximately L3 vertebral body new since 04/15/2021 Vertebral body heights otherwise maintained without additional fracture, subluxation, or bone destruction. IMPRESSION: Osseous demineralization with dextroconvex thoracic scoliosis. New mild superior endplate compression deformity of T3 vertebral body since 04/15/2021. Electronically Signed   By: Lavonia Dana M.D.   On: 07/08/2021 10:36   DG Lumbar Spine Complete  Result Date: 07/08/2021 CLINICAL DATA:  [place] 3 hours ago EXAM: LUMBAR SPINE - COMPLETE 4+ VIEW COMPARISON:  None FINDINGS: Five non-rib-bearing lumbar vertebra. Osseous  demineralization. Biconvex thoracolumbar scoliosis. Multilevel disc space narrowing and endplate spur formation. No fracture, subluxation, or bone destruction. SI joints preserved. Tubal ligation clips in pelvis. BILATERAL renal calculi largest LEFT kidney 13 mm diameter. IMPRESSION: Degenerative disc disease changes and scoliosis lumbar spine. No acute osseous abnormalities. BILATERAL renal calculi. Electronically Signed   By: Lavonia Dana M.D.   On: 07/08/2021 10:34   DG Pelvis 1-2 Views  Result Date: 07/08/2021 CLINICAL DATA:  [place] 3 hours ago, hip pain EXAM: PELVIS - 1-2 VIEW COMPARISON:  None FINDINGS: Osseous demineralization. Hip and SI joint spaces preserved. Mildly distracted and angulated intertrochanteric fracture RIGHT femur. No additional fracture, dislocation, or bone destruction. Tubal ligation clips in pelvis. IMPRESSION: Mildly displaced and angulated intertrochanteric fracture RIGHT femur. Electronically Signed   By: Lavonia Dana M.D.   On: 07/08/2021 10:37   Pelvis Portable  Result Date: 07/09/2021 CLINICAL DATA:  Status post intramedullary nail. EXAM: PORTABLE PELVIS 1-2 VIEWS COMPARISON:  Pelvis x-ray 07/08/2021 FINDINGS: There is a new short stem right femoral intramedullary nail and hip screw fixating intratrochanteric fracture. Alignment is anatomic. There is lateral soft tissue swelling and air compatible with recent surgery. There is no dislocation. There are surgical clips in the pelvis. IMPRESSION: 1. ORIF right femoral intratrochanteric fracture. Alignment is anatomic. Electronically Signed   By: Ronney Asters M.D.   On: 07/09/2021 19:05   DG Knee Complete 4 Views Right  Result Date: 07/08/2021 CLINICAL DATA:  Knee pain post fall EXAM: RIGHT KNEE - COMPLETE 4+ VIEW COMPARISON:  None FINDINGS: Osseous demineralization. Diffuse joint space narrowing. No acute fracture, dislocation, or bone destruction. No joint effusion. IMPRESSION: No acute osseous abnormalities. Electronically  Signed   By: Lavonia Dana M.D.   On: 07/08/2021 10:30   DG C-Arm 1-60 Min-No Report  Result Date: 07/09/2021 Fluoroscopy was utilized by the requesting physician.  No radiographic interpretation.   ECHOCARDIOGRAM COMPLETE  Result Date: 07/09/2021    ECHOCARDIOGRAM REPORT   Patient Name:   KIMBERLLY NORGARD Date of Exam: 07/09/2021 Medical Rec #:  [id number]        Height:       65.5 in Accession #:    [id number]       Weight:       144.0 lb Date of Birth:  Oct 05, 1955        BSA:          1.730 m Patient Age:    80 years         BP:           117/77 mmHg Patient Gender: F                HR:           108 bpm. Exam Location:  Inpatient Procedure: 2D Echo, Color Doppler and Cardiac Doppler Indications:    Preop  History:        Patient has no prior history of Echocardiogram examinations.  Sonographer:    Jyl Heinz Referring Phys: [id number] [place] IMPRESSIONS  1. Left ventricular ejection fraction, by estimation, is 55 to 60%. The left ventricle has normal function. Left ventricular endocardial border not optimally defined to evaluate regional wall motion. There is mild concentric left ventricular hypertrophy. Left ventricular diastolic parameters are consistent with Grade I diastolic dysfunction (impaired relaxation).  2. Right ventricular systolic function was not well visualized. The right ventricular size is normal. There is normal pulmonary artery systolic pressure.  3. The mitral valve is grossly normal. No evidence of mitral valve regurgitation.  4. The aortic valve is tricuspid. Aortic valve regurgitation is not visualized. No aortic stenosis is present.  5. The inferior vena cava is normal in size with greater than 50% respiratory variability, suggesting right atrial pressure of 3 mmHg. Comparison(s): No prior Echocardiogram. Conclusion(s)/Recommendation(s): In future studies would perform with echo contrast. FINDINGS  Left Ventricle: Left ventricular ejection fraction, by estimation, is 55 to 60%. The  left ventricle has normal function. Left ventricular endocardial border not optimally defined to evaluate regional wall motion. The left ventricular internal cavity size was small. There is mild concentric left ventricular hypertrophy. Left ventricular diastolic parameters are consistent with Grade I diastolic dysfunction (impaired relaxation). Right Ventricle: The right ventricular size is normal. Right vetricular wall thickness was not well visualized. Right ventricular systolic function was not well visualized. There is normal pulmonary artery systolic pressure. The tricuspid regurgitant velocity is 2.52 m/s, and with an assumed right atrial pressure of 3 mmHg, the estimated right ventricular systolic pressure is 09.7 mmHg. Left Atrium: Left atrial size was normal in size. Right Atrium: Right atrial size was normal in size. Pericardium: Trivial pericardial effusion is present. Mitral Valve: The mitral valve is grossly normal. No evidence of mitral valve regurgitation. Tricuspid Valve: The tricuspid valve is normal in structure. Tricuspid valve regurgitation is mild . No evidence of tricuspid stenosis. Aortic Valve: The aortic valve is tricuspid. Aortic valve regurgitation is not visualized. No aortic stenosis is present. Aortic valve peak gradient measures 3.8 mmHg. Pulmonic Valve: The pulmonic valve was normal in structure. Pulmonic valve regurgitation is not visualized. No evidence of pulmonic stenosis. Aorta: The aortic root and ascending aorta are structurally normal, with no evidence of dilitation. Venous: The inferior vena cava is normal in size with greater than 50% respiratory variability, suggesting right atrial pressure of 3 mmHg. IAS/Shunts: No atrial level shunt detected by color flow Doppler.  LEFT VENTRICLE PLAX 2D LVIDd:         3.10 cm     Diastology LVIDs:         2.40 cm     LV e' medial:    3.92 cm/s LV PW:         1.20 cm     LV E/e' medial:  12.2  LV IVS:        1.00 cm     LV e' lateral:    7.18 cm/s LVOT diam:     1.80 cm     LV E/e' lateral: 6.7 LV SV:         38 LV SV Index:   22 LVOT Area:     2.54 cm  LV Volumes (MOD) LV vol d, MOD A2C: 44.7 ml LV vol d, MOD A4C: 43.2 ml LV vol s, MOD A2C: 18.4 ml LV vol s, MOD A4C: 18.3 ml LV SV MOD A2C:     26.3 ml LV SV MOD A4C:     43.2 ml LV SV MOD BP:      25.4 ml RIGHT VENTRICLE            IVC RV S prime:     9.79 cm/s  IVC diam: 1.20 cm TAPSE (M-mode): 1.4 cm LEFT ATRIUM             Index LA diam:        1.80 cm 1.04 cm/m LA Vol (A2C):   6.8 ml  3.93 ml/m LA Vol (A4C):   6.6 ml  3.79 ml/m LA Biplane Vol: 7.0 ml  4.06 ml/m  AORTIC VALVE AV Area (Vmax): 2.53 cm AV Vmax:        97.30 cm/s AV Peak Grad:   3.8 mmHg LVOT Vmax:      96.60 cm/s LVOT Vmean:     68.500 cm/s LVOT VTI:       0.151 m  AORTA Ao Root diam: 3.10 cm Ao Asc diam:  2.80 cm MITRAL VALVE               TRICUSPID VALVE MV Area (PHT): 6.37 cm    TR Peak grad:   25.4 mmHg MV Decel Time: 119 msec    TR Vmax:        252.00 cm/s MV E velocity: 48.00 cm/s MV A velocity: 85.30 cm/s  SHUNTS MV E/A ratio:  0.56        Systemic VTI:  0.15 m                            Systemic Diam: 1.80 cm Rudean Haskell MD Electronically signed by Rudean Haskell MD Signature Date/Time: 07/09/2021/11:34:14 AM    Final    DG HIP UNILAT WITH PELVIS 2-3 VIEWS RIGHT  Result Date: 07/09/2021 CLINICAL DATA:  Intramedullary nail intertrochanteric right femur fluoroscopy EXAM: DG HIP (WITH OR WITHOUT PELVIS) 2-3V RIGHT COMPARISON:  Right femur radiographs 07/08/2021 FINDINGS: Images were performed intraoperatively without the presence of a radiologist. Total fluoroscopy images: 2 Total dose: 5.23 mGy Total fluoroscopy time: 34 seconds The patient appears to be undergoing cephalomedullary nail fixation of the prior intertrochanteric fracture. Please see intraoperative findings for further detail. IMPRESSION: Cephalomedullary nail fixation of prior intertrochanteric fracture. Electronically Signed   By: Yvonne Kendall M.D.   On: 07/09/2021 17:55   DG FEMUR, MIN 2 VIEWS RIGHT  Result Date: 07/08/2021 CLINICAL DATA:  Hip pain post fall 3 hours ago EXAM: RIGHT FEMUR 2 VIEWS COMPARISON:  08/22/2020 FINDINGS: Osseous demineralization. RIGHT hip and SI joint spaces preserved. Minimally distracted and angulated intertrochanteric fracture RIGHT femur. No dislocation. Visualized pelvis intact. IMPRESSION: Minimally distracted and angulated intertrochanteric fracture RIGHT femur. Electronically Signed   By: Lavonia Dana M.D.   On: 07/08/2021 10:31    Microbiology: Results for orders  placed or performed during the hospital encounter of 07/08/21  Resp Panel by RT-PCR (Flu A&B, Covid) Nasopharyngeal Swab     Status: None   Collection Time: 07/08/21 11:26 AM   Specimen: Nasopharyngeal Swab; Nasopharyngeal(NP) swabs in vial transport medium  Result Value Ref Range Status   SARS Coronavirus 2 by RT PCR NEGATIVE NEGATIVE Final    Comment: (NOTE) SARS-CoV-2 target nucleic acids are NOT DETECTED.  The SARS-CoV-2 RNA is generally detectable in upper respiratory specimens during the acute phase of infection. The lowest concentration of SARS-CoV-2 viral copies this assay can detect is 138 copies/mL. A negative result does not preclude SARS-Cov-2 infection and should not be used as the sole basis for treatment or other patient management decisions. A negative result may occur with  improper specimen collection/handling, submission of specimen other than nasopharyngeal swab, presence of viral mutation(s) within the areas targeted by this assay, and inadequate number of viral copies(<138 copies/mL). A negative result must be combined with clinical observations, patient history, and epidemiological information. The expected result is Negative.  Fact Sheet for Patients:  EntrepreneurPulse.com.au  Fact Sheet for Healthcare Providers:  IncredibleEmployment.be  This test is no t yet  approved or cleared by the [place] FDA and  has been authorized for detection and/or diagnosis of SARS-CoV-2 by FDA under an Emergency Use Authorization (EUA). This EUA will remain  in effect (meaning this test can be used) for the duration of the COVID-19 declaration under Section 564(b)(1) of the Act, 21 U.S.C.section 360bbb-3(b)(1), unless the authorization is terminated  or revoked sooner.       Influenza A by PCR NEGATIVE NEGATIVE Final   Influenza B by PCR NEGATIVE NEGATIVE Final    Comment: (NOTE) The Xpert Xpress SARS-CoV-2/FLU/RSV plus assay is intended as an aid in the diagnosis of influenza from Nasopharyngeal swab specimens and should not be used as a sole basis for treatment. Nasal washings and aspirates are unacceptable for Xpert Xpress SARS-CoV-2/FLU/RSV testing.  Fact Sheet for Patients: EntrepreneurPulse.com.au  Fact Sheet for Healthcare Providers: IncredibleEmployment.be  This test is not yet approved or cleared by the [place] FDA and has been authorized for detection and/or diagnosis of SARS-CoV-2 by FDA under an Emergency Use Authorization (EUA). This EUA will remain in effect (meaning this test can be used) for the duration of the COVID-19 declaration under Section 564(b)(1) of the Act, 21 U.S.C. section 360bbb-3(b)(1), unless the authorization is terminated or revoked.  Performed at [facility], [place] [address] [place], [place] [address]   Surgical PCR screen     Status: None   Collection Time: 07/08/21  4:17 PM   Specimen: Nasal Mucosa; Nasal Swab  Result Value Ref Range Status   MRSA, PCR NEGATIVE NEGATIVE Final   Staphylococcus aureus NEGATIVE NEGATIVE Final    Comment: (NOTE) The Xpert SA Assay (FDA approved for NASAL specimens in patients 87 years of age and older), is one component of a comprehensive surveillance program. It is not intended to diagnose infection nor to guide or  monitor treatment. Performed at [facility], [place] [address], [place], [place] [address]     Labs: CBC: Recent Labs  Lab 07/08/21 1052 07/09/21 0456 07/10/21 0458 07/11/21 0600 07/12/21 0539  WBC 6.5 9.0 17.4* 17.4* 16.1*  NEUTROABS 5.0  --   --   --   --   HGB 15.3* 14.5 13.4 12.3 12.8  HCT 45.3 42.5 40.2 34.8* 35.5*  MCV 106.3* 105.7* 108.9* 103.0* 100.0  PLT 93* 91* 68* 73* 98*   Basic Metabolic Panel: Recent Labs  Lab 07/08/21 1052 07/09/21 0456 07/10/21 0458 07/11/21 0600  NA 139 137 136 135  K 3.9 3.8 3.9 4.3  CL 106 109 109 109  CO2 27 20* 17* 19*  GLUCOSE 99 153* 134* 80  BUN 26* 29* 39* 38*  CREATININE 1.32* 1.47* 1.42* 1.10*  CALCIUM 9.4 8.9 8.3* 8.2*  MG 2.4  --   --  2.0  PHOS 4.2  --   --   --    Liver Function Tests: Recent Labs  Lab 07/08/21 1052 07/09/21 0456  AST 57* 41  ALT 34 28  ALKPHOS 80 63  BILITOT 0.7 0.6  PROT 6.5 5.6*  ALBUMIN 3.3* 2.8*   CBG: Recent Labs  Lab 07/09/21 1544 07/09/21 1815  GLUCAP 118* 127*    Discharge time spent: 35 minutes.  Signed: Antonieta Pert, MD [facility] 07/12/2021

## 2021-07-12 NOTE — ED Provider Notes (Signed)
[facility] DEPT Provider Note   CSN: [id number] Arrival date & time: 07/12/21  1400     History  Chief Complaint  Patient presents with   Altered Mental Status    Brystol Wasilewski is a 66 y.o. female.  HPI Patient presents same day as discharge, now with nursing home concern of altered mental status.  History is obtained by patient somewhat, EMS staff, and chart review.  The patient answers questions briefly, but mostly appropriately.  She seemingly denies any pain.  EMS reports that on arrival to the nursing facility nurse practitioner saw the patient, and felt that the patient was more altered than anticipated, and sent her back here for evaluation.  I subsequently discussed the patient's case with her discharging physician.  We discussed her history, details below.  In essence she was stable today, hemodynamically unremarkable, with ongoing appropriate medication orders for management of pain, seizures, prior stroke with anticipation of returning to her facility but to nursing unit rather than assisted living.    Home Medications Prior to Admission medications   Medication Sig Start Date End Date Taking? Authorizing Provider  acetaminophen (TYLENOL) 325 MG tablet Take 650 mg by mouth every 4 (four) hours as needed for headache or mild pain.    [provider]  baclofen (LIORESAL) 10 MG tablet Take 1 tablet up to four times daily as needed Patient taking differently: Take 10 mg by mouth 4 (four) times daily as needed for muscle spasms. 09/01/20   Cameron Sprang, MD  Calcium Carbonate 500 MG CHEW Chew 500 mg by mouth daily.    [provider]  Calcium Citrate-Vitamin D 315-5 MG-MCG TABS Take 1 tablet by mouth daily.    [provider]  divalproex (DEPAKOTE ER) 500 MG 24 hr tablet Take 2 tablets every night Patient taking differently: Take 1,000 mg by mouth at bedtime. 09/01/20   Cameron Sprang, MD  Docosahexaenoic Acid (DHA  NATURAL OMEGA-3 PO) Take 1 capsule by mouth daily. Omega-3 DHA (DR/EC) '350mg'$ -'325mg'$ -'90mg'$ -'597mg'$     [provider]  enoxaparin (LOVENOX) 30 MG/0.3ML injection Inject 0.3 mLs (30 mg total) into the skin daily. 07/11/21 08/10/21  Swinteck, Aaron Edelman, MD  feeding supplement (ENSURE ENLIVE / ENSURE PLUS) LIQD Take 237 mLs by mouth 2 (two) times daily between meals for 7 days. 07/12/21 07/19/21  Antonieta Pert, MD  fexofenadine (ALLEGRA) 180 MG tablet Take 180 mg by mouth daily.    [provider]  guaiFENesin (MUCINEX) 600 MG 12 hr tablet Take 600 mg by mouth 2 (two) times daily as needed for cough.    [provider]  Multiple Vitamins-Minerals (THEREMS-M) TABS Take 1 tablet by mouth daily.    [provider]  nystatin powder Apply 1 application. topically 2 (two) times daily as needed (rash under breast).    [provider]  oxyCODONE (OXY IR/ROXICODONE) 5 MG immediate release tablet Take 1 tablet (5 mg total) by mouth every 4 (four) hours as needed for breakthrough pain ((for MODERATE breakthrough pain)). 07/10/21   Swinteck, Aaron Edelman, MD  polyethylene glycol (MIRALAX / GLYCOLAX) 17 g packet Take 17 g by mouth daily as needed for mild constipation. 07/12/21   Antonieta Pert, MD  Propylene Glycol (SYSTANE BALANCE) 0.6 % SOLN Place 1 drop into both eyes 2 (two) times daily.    [provider]  senna (SENOKOT) 8.6 MG TABS tablet Take 1 tablet (8.6 mg total) by mouth 2 (two) times daily. 07/12/21   Kc,  Maren Beach, MD  topiramate (TOPAMAX) 200 MG tablet Take 300 mg by mouth 2 (two) times daily.    [provider]  Vitamin D3 (VITAMIN D) 25 MCG tablet Take 1,000 Units by mouth daily.    [provider]  zinc oxide 20 % ointment Apply 1 application. topically as needed (after every incontinent episode).    [provider]      Allergies    Aspirin, Cheese, Chocolate, Coffee flavor, Other, and Red wine complex [germanium]    Review of Systems   Review  of Systems  Unable to perform ROS: Acuity of condition   Physical Exam Updated Vital Signs BP (!) 124/93 (BP Location: Right Arm)    Pulse (!) 103    Temp (!) 97.5 F (36.4 C)    Resp 18    Ht 5' 5.5" (1.664 m)    Wt 65 kg    SpO2 99%    BMI 23.48 kg/m  Physical Exam Vitals and nursing note reviewed.  Constitutional:      General: She is not in acute distress.    Appearance: She is well-developed. She is ill-appearing. She is not toxic-appearing or diaphoretic.  HENT:     Head: Normocephalic and atraumatic.  Eyes:     Conjunctiva/sclera: Conjunctivae normal.  Cardiovascular:     Rate and Rhythm: Normal rate and regular rhythm.  Pulmonary:     Effort: Pulmonary effort is normal. No respiratory distress.     Breath sounds: Normal breath sounds. No stridor.  Abdominal:     General: There is no distension.  Musculoskeletal:     Comments: No deformity, patient moves her extremities spontaneously less so on the right side.  Skin:    General: Skin is warm and dry.  Neurological:     Mental Status: She is alert.     Cranial Nerves: Cranial nerve deficit present.     Motor: Weakness and atrophy present.     Comments: Very poor hearing, no facial asymmetry.  Speech is brief, clear.  Patient roughly oriented to place, oriented to self, not to time. Known right-sided weakness from prior stroke, seemingly unchanged.  Psychiatric:        Mood and Affect: Mood normal.    ED Results / Procedures / Treatments   Labs (all labs ordered are listed, but only abnormal results are displayed) Labs Reviewed  URINALYSIS, ROUTINE W REFLEX MICROSCOPIC    EKG None  Radiology No results found.  Procedures Procedures    Medications Ordered in ED Medications  sodium chloride 0.9 % bolus 1,000 mL (has no administration in time range)    And  0.9 %  sodium chloride infusion (has no administration in time range)    ED Course/ Medical Decision Making/ A&P  This patient presents to the ED  for concern of altered mental status in the context of recent discharge following admission for orthopedic injuries, this involves an extensive number of treatment options, and is a complaint that carries with it a high risk of complications and morbidity.  The differential diagnosis includes seizure, infection, intracranial injury, infection, electrolyte abnormalities   Co morbidities that complicate the patient evaluation  Prior stroke, seizure, hospitalization, new medications   Social Determinants of Health:  Age, immobility, seizures   Additional history obtained:  Additional history and/or information obtained from as above EMS, nursing home notes, discharging physician from this morning External records from outside source obtained and reviewed including hospital course as below, EMS reports details of  transfer there and back, no hemodynamic instability. 66 y.o. 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  Portable chest radiograph with no acute cardiopulmonary pathology.  Right femur and pelvic x-ray showed right femur intertrochanteric fracture.     After the initial evaluation, orders, including: CT, x-ray, labs, urinalysis were initiated.  Patient placed on Cardiac and Pulse-Oximetry Monitors. The patient was maintained on a cardiac monitor.  The cardiac monitored showed an rhythm of sinus rhythm 90 unremarkable The patient was also maintained on pulse oximetry. The readings were typically 100% room air normal  On repeat evaluation of the patient improved no improvement was subtle, she was  seemingly slightly more interactive.  Lab Tests:  I personally interpreted labs.  The pertinent results include: Hypokalemia, anemia, thrombocytopenia, and a decreased leukocytosis compared to prior  Imaging Studies ordered:  I independently visualized and interpreted imaging which showed CT without acute new findings, persistent encephalopathy I agree with the radiologist interpretation  Consultations Obtained:  I requested consultation with the internal medicine doctor who discharged the patient earlier in the day, we discussed her case as well as with our neurology colleagues,  and discussed lab and imaging findings as well as pertinent plan - they recommend: In discussing the patient's presentation after discharge earlier our internal medicine, feels as though the patient may be more sluggish than prior, some consideration of breakthrough seizure which she had during her hospitalization.  We agreed the patient has no overt evidence for new infection, or trauma.  Neurology consult with recommendation for transfer to our affiliated center for continuous EEG.  Dispostion / Final MDM:  After consideration of the diagnostic results and the patient's response to treatment, she will be admitted for further monitoring, management, resuscitation with potassium repletion.  This adult female presents same day as discharge after arriving to nursing facility about being found to be altered.  Broad differential given the patient's prior strokes, encephalopathy, including additional stroke, seizure, electrolyte abnormalities, dehydration.  Patient's vital signs generally reassuring aside from borderline tachycardia, no evidence for new infection, urinalysis is pending on admission.  Patient's head CT is consistent with prior, labs somewhat reassuring, with decreasing leukocytosis, but with some evidence for anemia.  No obvious source of bleeding though she was noted to be on Lovenox.  Stool was normal in  appearance.  Final Clinical Impression(s) / ED Diagnoses Final diagnoses:  Delirium     Carmin Muskrat, MD 07/12/21 1722

## 2021-07-13 ENCOUNTER — Other Ambulatory Visit: Payer: Self-pay

## 2021-07-13 ENCOUNTER — Encounter (HOSPITAL_COMMUNITY): Payer: Self-pay | Admitting: Internal Medicine

## 2021-07-13 ENCOUNTER — Inpatient Hospital Stay (HOSPITAL_COMMUNITY): Payer: Medicare Other

## 2021-07-13 DIAGNOSIS — G40409 Other generalized epilepsy and epileptic syndromes, not intractable, without status epilepticus: Secondary | ICD-10-CM

## 2021-07-13 DIAGNOSIS — E7220 Disorder of urea cycle metabolism, unspecified: Secondary | ICD-10-CM | POA: Diagnosis present

## 2021-07-13 DIAGNOSIS — G40219 Localization-related (focal) (partial) symptomatic epilepsy and epileptic syndromes with complex partial seizures, intractable, without status epilepticus: Secondary | ICD-10-CM | POA: Diagnosis not present

## 2021-07-13 DIAGNOSIS — G8191 Hemiplegia, unspecified affecting right dominant side: Secondary | ICD-10-CM

## 2021-07-13 DIAGNOSIS — R410 Disorientation, unspecified: Secondary | ICD-10-CM

## 2021-07-13 DIAGNOSIS — S72141A Displaced intertrochanteric fracture of right femur, initial encounter for closed fracture: Secondary | ICD-10-CM | POA: Diagnosis not present

## 2021-07-13 DIAGNOSIS — R4182 Altered mental status, unspecified: Secondary | ICD-10-CM

## 2021-07-13 DIAGNOSIS — E876 Hypokalemia: Secondary | ICD-10-CM | POA: Diagnosis present

## 2021-07-13 DIAGNOSIS — E871 Hypo-osmolality and hyponatremia: Secondary | ICD-10-CM | POA: Diagnosis present

## 2021-07-13 DIAGNOSIS — G9341 Metabolic encephalopathy: Secondary | ICD-10-CM | POA: Diagnosis not present

## 2021-07-13 LAB — GASTROINTESTINAL PANEL BY PCR, STOOL (REPLACES STOOL CULTURE)

## 2021-07-13 LAB — LACTIC ACID, PLASMA: Lactic Acid, Venous: 1.1 mmol/L (ref 0.5–1.9)

## 2021-07-13 LAB — BASIC METABOLIC PANEL
Anion gap: 8 (ref 5–15)
BUN: 26 mg/dL — ABNORMAL HIGH (ref 8–23)
CO2: 16 mmol/L — ABNORMAL LOW (ref 22–32)
Calcium: 8.5 mg/dL — ABNORMAL LOW (ref 8.9–10.3)
Chloride: 113 mmol/L — ABNORMAL HIGH (ref 98–111)
Creatinine, Ser: 0.83 mg/dL (ref 0.44–1.00)
GFR, Estimated: >60 mL/min (ref >60)
Glucose, Bld: 102 mg/dL — ABNORMAL HIGH (ref 70–99)
Potassium: 3.1 mmol/L — ABNORMAL LOW (ref 3.5–5.1)
Sodium: 137 mmol/L (ref 135–145)

## 2021-07-13 LAB — CBC WITH DIFFERENTIAL/PLATELET
Abs Immature Granulocytes: 0.06 10*3/uL (ref 0.00–0.07)
Basophils Absolute: 0 10*3/uL (ref 0.0–0.1)
Basophils Relative: 0 %
Eosinophils Absolute: 0 10*3/uL (ref 0.0–0.5)
Eosinophils Relative: 0 %
HCT: 37.2 % (ref 36.0–46.0)
Hemoglobin: 12.7 g/dL (ref 12.0–15.0)
Immature Granulocytes: 0 %
Lymphocytes Relative: 9 %
Lymphs Abs: 1.2 10*3/uL (ref 0.7–4.0)
MCH: 36.1 pg — ABNORMAL HIGH (ref 26.0–34.0)
MCHC: 34.1 g/dL (ref 30.0–36.0)
MCV: 105.7 fL — ABNORMAL HIGH (ref 80.0–100.0)
Monocytes Absolute: 1.5 10*3/uL — ABNORMAL HIGH (ref 0.1–1.0)
Monocytes Relative: 11 %
Neutro Abs: 10.9 10*3/uL — ABNORMAL HIGH (ref 1.7–7.7)
Neutrophils Relative %: 80 %
Platelets: 128 10*3/uL — ABNORMAL LOW (ref 150–400)
RBC: 3.52 MIL/uL — ABNORMAL LOW (ref 3.87–5.11)
RDW: 12.5 % (ref 11.5–15.5)
WBC: 13.8 10*3/uL — ABNORMAL HIGH (ref 4.0–10.5)
nRBC: 0 % (ref 0.0–0.2)

## 2021-07-13 IMAGING — MR MR HEAD W/O CM
4 series · 48 of 48 positions shown · non-contrast
Comparison: Prior CT from [DATE] as well as previous MRI from
[DATE].

CLINICAL DATA: Initial evaluation for acute confusion,
encephalopathy.

EXAM:
MRI HEAD WITHOUT CONTRAST
TECHNIQUE: Multiplanar, multiecho pulse sequences of the brain and surrounding
structures were obtained without intravenous contrast.

[Series 5: DWI · axial · 3.0mm · 0.88mm/px · z∈[-38,+108]mm · 20 of 100 slices shown (1 of 4)]
[im 1/100]
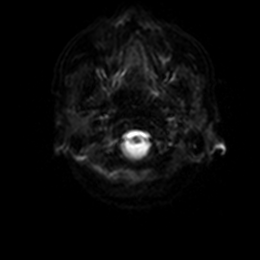
[im 6/100]
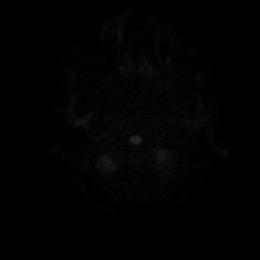
[im 11/100]
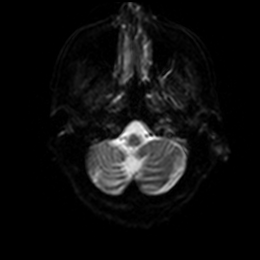
[im 16/100]
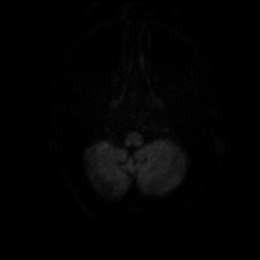
[im 21/100]
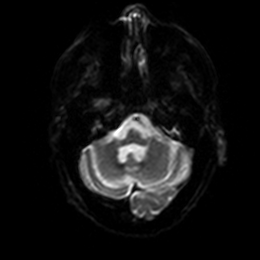
[im 27/100]
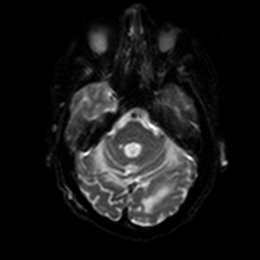
[im 32/100]
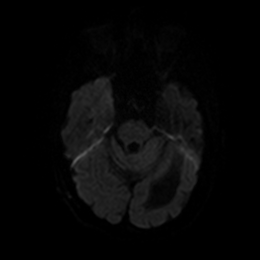
[im 37/100]
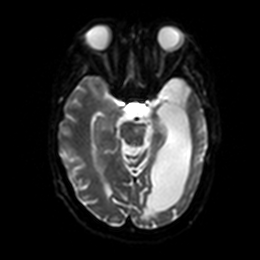
[im 42/100]
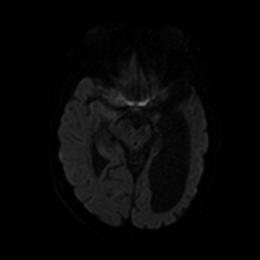
[im 47/100]
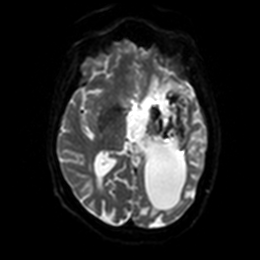
[im 53/100]
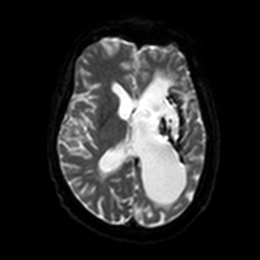
[im 58/100]
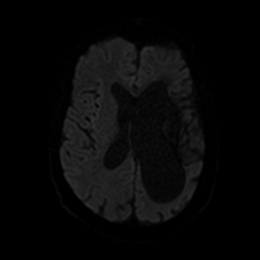
[im 63/100]
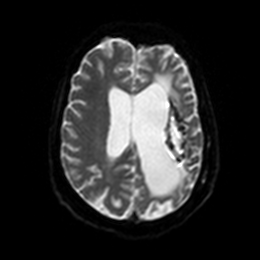
[im 68/100]
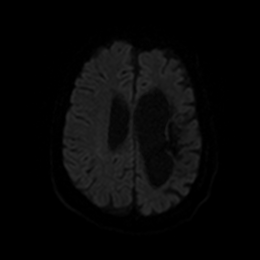
[im 73/100]
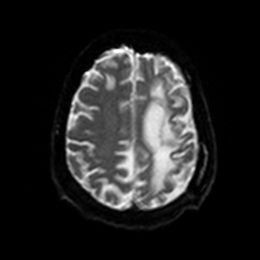
[im 79/100]
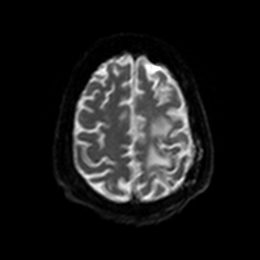
[im 84/100]
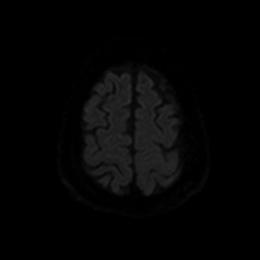
[im 89/100]
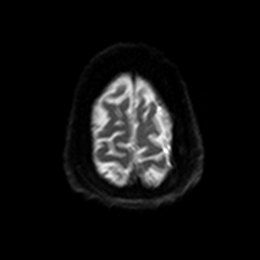
[im 94/100]
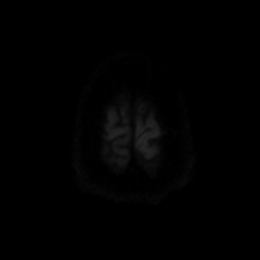
[im 100/100]
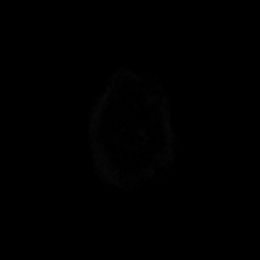

[Series 6: DWI · axial · 3.0mm · 0.88mm/px · z∈[-38,+108]mm · 10 of 50 slices shown (2 of 4)]
[im 1/50]
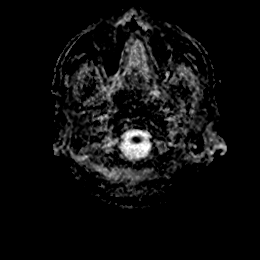
[im 6/50]
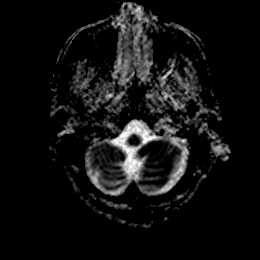
[im 11/50]
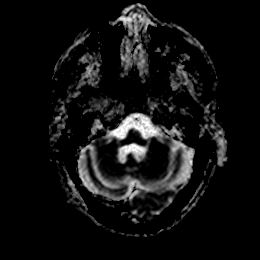
[im 17/50]
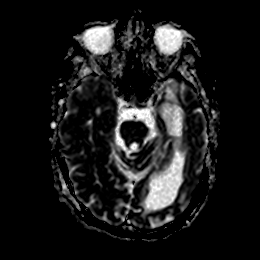
[im 22/50]
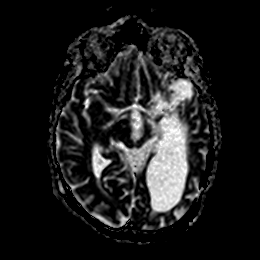
[im 28/50]
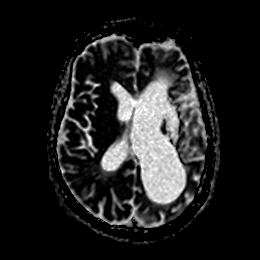
[im 33/50]
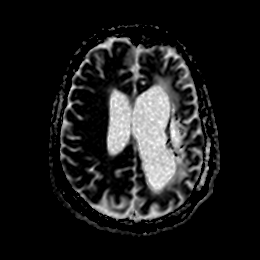
[im 39/50]
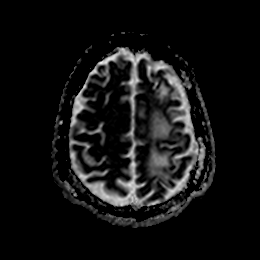
[im 44/50]
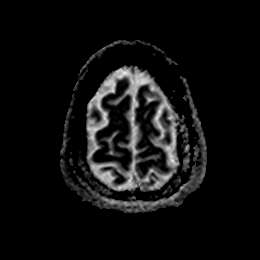
[im 50/50]
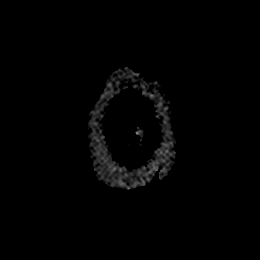

[Series 7: DWI · coronal · 4.0mm · 0.88mm/px · 12 of 64 slices shown (3 of 4)]
[im 1/64]
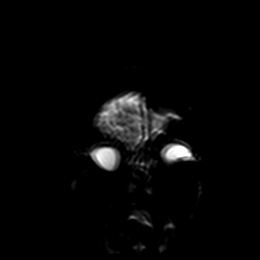
[im 6/64]
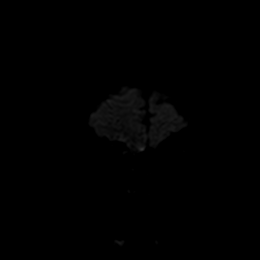
[im 12/64]
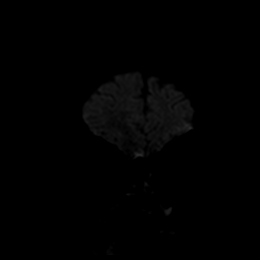
[im 18/64]
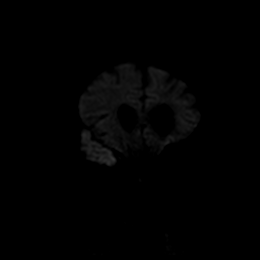
[im 23/64]
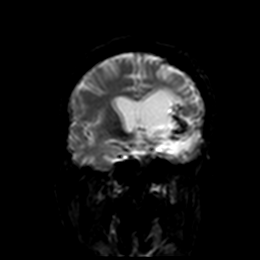
[im 29/64]
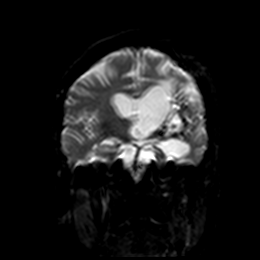
[im 35/64]
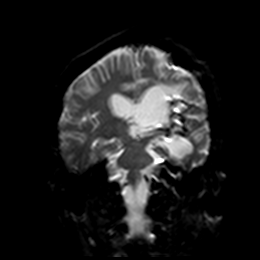
[im 41/64]
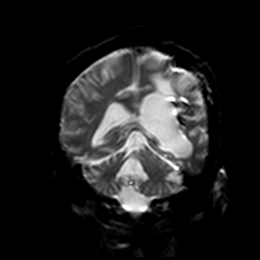
[im 46/64]
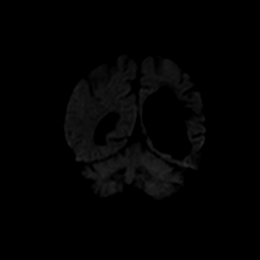
[im 52/64]
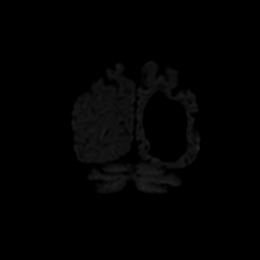
[im 58/64]
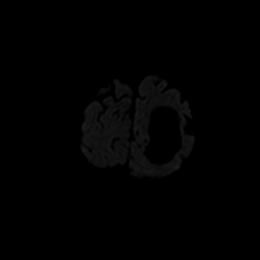
[im 64/64]
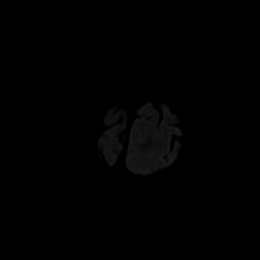

[Series 8: DWI · coronal · 4.0mm · 0.88mm/px · 6 of 32 slices shown (4 of 4)]
[im 1/32]
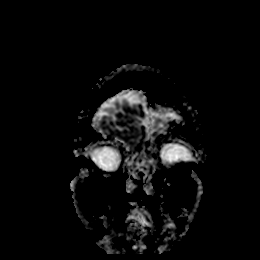
[im 7/32]
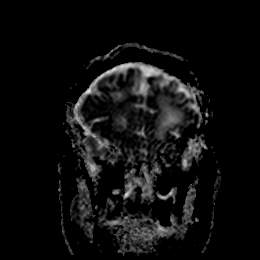
[im 13/32]
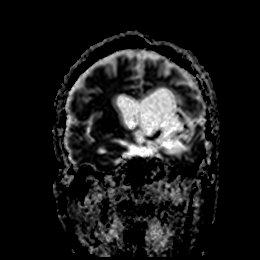
[im 19/32]
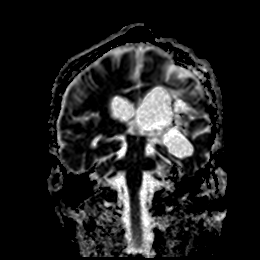
[im 25/32]
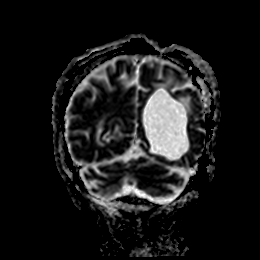
[im 32/32]
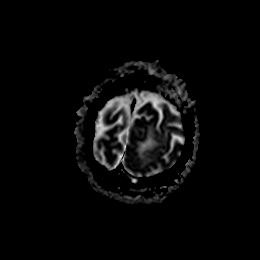

[48 of 48 positions shown; findings below may reference images not displayed]

FINDINGS: Brain: Examination technically limited as the patient was unable to
tolerate the full length of the exam. Axial and coronal DWI
sequences only were performed.

Diffusion-weighted imaging demonstrates no evidence for acute or
subacute infarct. Large area of encephalomalacia of involving the
left cerebral hemisphere with associated ex vacuo dilatation of the
left lateral ventricle again seen. No visible mass lesion, mass
effect, or midline shift. No new or progressive hydrocephalus. No
extra-axial fluid collection.

Vascular: Not well assessed on this limited exam.

Skull and upper cervical spine: Not well assessed on this limited
exam.

Sinuses/Orbits: Not well assessed on this limited exam.

Other: None.
IMPRESSION: 1. Technically limited exam due to the patient's inability to
tolerate the full length of the study. Axial and coronal DWI
sequences only were performed.
2. No evidence for acute or subacute infarct.
3. Large area of encephalomalacia involving the left cerebral
hemisphere with associated ex vacuo dilatation of the left lateral
ventricle, stable.

## 2021-07-13 MED ORDER — POTASSIUM CHLORIDE 10 MEQ/100ML IV SOLN
10.0000 meq | INTRAVENOUS | Status: AC
  Administered 2021-07-13 (×4): 10 meq via INTRAVENOUS
  Filled 2021-07-13 (×4): qty 100

## 2021-07-13 MED ORDER — ORAL CARE MOUTH RINSE
15.0000 mL | Freq: Two times a day (BID) | OROMUCOSAL | Status: DC
  Administered 2021-07-13 – 2021-07-17 (×8): 15 mL via OROMUCOSAL

## 2021-07-13 MED ORDER — TOPIRAMATE 100 MG PO TABS
300.0000 mg | ORAL_TABLET | Freq: Two times a day (BID) | ORAL | Status: DC
  Administered 2021-07-13 – 2021-07-17 (×9): 300 mg via ORAL
  Filled 2021-07-13 (×9): qty 3

## 2021-07-13 MED ORDER — ENOXAPARIN SODIUM 40 MG/0.4ML IJ SOSY
40.0000 mg | PREFILLED_SYRINGE | INTRAMUSCULAR | Status: DC
  Administered 2021-07-14 – 2021-07-17 (×4): 40 mg via SUBCUTANEOUS
  Filled 2021-07-13 (×2): qty 0.6
  Filled 2021-07-13 (×2): qty 0.4

## 2021-07-13 NOTE — Hospital Course (Signed)
Tonya Thompson is a 66 year old female with past medical history significant for cerebral AVMs, history of CVA x5 with resulting right-sided hemiparesis, history of seizure, CKD stage IIIb, urolithiasis, GERD, HLD and recent right femur fracture s/p intramedullary fixation who presented to [facility] for altered mental status.  Patient was just discharged from the hospital following admission for hip fracture.  After arriving at the facility, staff noted that she seemed more confused than her reported baseline; and was immediately sent back to the ED for further evaluation. ? ?In the ED, temperature 97.5 ?F, HR 103, RR 18, BP 124/93, SPO2 98% on room air. WBC 11.3, hemoglobin 9.9, platelets 95.  Sodium 133, potassium 2.6, chloride 109, CO2 18, glucose 93, BUN 31, creatinine 0.92.  AST 31, ALT 10.  Total bilirubin 0.8.  Urinalysis with large leukocytes, negative nitrite, few bacteria, 11-20 WBCs.  EtOH level less than 10.  CT head without contrast with no acute intracranial pathology, extensive encephalomalacia of the left cerebral hemisphere with ex vacuo dilation of the left lateral ventricle.  Chest x-ray with no acute cardiopulmonary disease process.  MR brain without contrast with no evidence for acute or subacute infarct, large area of encephalomalacia involving the left cerebral hemisphere, limited due to inability to tolerate full length of the study.  EDP discussed with on-call neurologist who recommended transfer to [facility] for EEG for concern of seizure.  Hospital service was consulted and patient was transferred to [facility] for further evaluation and management. ? ?

## 2021-07-13 NOTE — Assessment & Plan Note (Signed)
Potassium 2.6 on admission.  Repleted.  Potassium is at 3.1 this morning, will continue to replete. --Repeat electrolytes in the a.m. to include magnesium

## 2021-07-13 NOTE — Evaluation (Signed)
Physical Therapy Evaluation ?Patient Details ?Name: Tonya Thompson ?MRN: [id number] ?DOB: 13-Jan-1956 ?Today's Date: 07/13/2021 ? ?History of Present Illness ? Patient is a 66 y/o female who presents on 07/12/21 from [facility] with AMS. Found to have acute metabolic encephalopathy, concern for seizures. D/ced to rehab on 07/12/21 and readmited to hospital same night. PMH includes CVA x6 with right sided weakness, AVM of brain, seizures, recent femur fx of RLE s/p IM nail 07/09/21.  ?Clinical Impression ? Patient presents with lethargy, generalized weakness, cognitive deficits, perseveration, impaired balance and impaired mobility s/p above. Pt recently d/ced to SNF and readmitted the same day. Requires total A for bed mobility and sitting balance with zero righting reactions or initiation of core activation. No active movement of BLEs to command; observed attempting to reposition LEs spontaneously. Pt with right residual hemiparesis due to prior CVAs. Would benefit from SNF to maximize independence and mobility and ease burden of care prior to return home. Will follow acutely. ?   ? ?Recommendations for follow up therapy are one component of a multi-disciplinary discharge planning process, led by the attending physician.  Recommendations may be updated based on patient status, additional functional criteria and insurance authorization. ? ?Follow Up Recommendations Skilled nursing-short term rehab (<3 hours/day) ? ?  ?Assistance Recommended at Discharge Frequent or constant Supervision/Assistance  ?Patient can return home with the following ? Two people to help with walking and/or transfers;Two people to help with bathing/dressing/bathroom;Direct supervision/assist for medications management;Help with stairs or ramp for entrance;Assist for transportation;Assistance with feeding;Assistance with cooking/housework;Direct supervision/assist for financial management ? ?  ?Equipment Recommendations None recommended by PT   ?Recommendations for Other Services ?    ?  ?Functional Status Assessment Patient has had a recent decline in their functional status and/or demonstrates limited ability to make significant improvements in function in a reasonable and predictable amount of time  ? ?  ?Precautions / Restrictions Precautions ?Precautions: Fall ?Precaution Comments: Rt hemiplegia 2* multiple CVAs, R shoulder sublux, hx of seizures, continuous EEG ?Restrictions ?Weight Bearing Restrictions: Yes ?RLE Weight Bearing: Weight bearing as tolerated  ? ?  ? ?Mobility ? Bed Mobility ?Overal bed mobility: Needs Assistance ?Bed Mobility: Rolling, Supine to Sit, Sit to Supine ?Rolling: Max assist ?  ?Supine to sit: Total assist, +2 for physical assistance, HOB elevated, +2 for safety/equipment ?Sit to supine: Total assist, +2 for physical assistance, HOB elevated, +2 for safety/equipment ?  ?General bed mobility comments: Total A for helicoptor technique using bed pad to get to EOB, posterior pelvic tilt and posterior lean/bias. No assist provided. ?  ? ?Transfers ?  ?  ?  ?  ?  ?  ?  ?  ?  ?General transfer comment: unable ?  ? ?Ambulation/Gait ?  ?  ?  ?  ?  ?  ?  ?  ? ?Stairs ?  ?  ?  ?  ?  ? ?Wheelchair Mobility ?  ? ?Modified Rankin (Stroke Patients Only) ?  ? ?  ? ?Balance Overall balance assessment: Needs assistance ?Sitting-balance support: Feet supported, No upper extremity supported ?Sitting balance-Leahy Scale: Zero ?Sitting balance - Comments: Total A for sitting balance, no righting reactions present. ?Postural control: Posterior lean ?  ?  ?  ?  ?  ?  ?  ?  ?  ?  ?  ?  ?  ?  ?   ? ? ? ?Pertinent Vitals/Pain Pain Assessment ?Pain Assessment: Faces ?Faces Pain Scale: Hurts little more ?  Pain Location: R hip with movement ?Pain Descriptors / Indicators: Grimacing, Guarding, Moaning ?Pain Intervention(s): Monitored during session, Repositioned  ? ? ?Home Living Family/patient expects to be discharged to:: Skilled nursing facility ?  ?  ?   ?  ?  ?  ?  ?  ?  ?   ?  ?Prior Function Prior Level of Function : Patient poor historian/Family not available ?  ?  ?  ?  ?  ?  ?  ?  ?  ? ? ?Hand Dominance  ? Dominant Hand: Left ? ?  ?Extremity/Trunk Assessment  ? Upper Extremity Assessment ?Upper Extremity Assessment: Defer to OT evaluation ?  ? ?Lower Extremity Assessment ?Lower Extremity Assessment: Difficult to assess due to impaired cognition;Generalized weakness ?RLE Deficits / Details: No active movement performed due to decreased arousal/cognition, PROM ankle to neutral and able toget into knee flexion slightly EOB. ?LLE Deficits / Details: No active movement performed due to decreased arousal/cognition, PROM ankle to neutral and able toget into knee flexion slightly EOB. ?  ? ?Cervical / Trunk Assessment ?Cervical / Trunk Assessment: Other exceptions ?Cervical / Trunk Exceptions: cervical spine positioned in left lateral side bend and extension  ?Communication  ? Communication: Expressive difficulties  ?Cognition Arousal/Alertness: Lethargic ?Behavior During Therapy: Flat affect ?Overall Cognitive Status: Difficult to assess ?  ?  ?  ?  ?  ?  ?  ?  ?  ?  ?  ?  ?  ?  ?  ?  ?General Comments: Expressive difficulities noted; slow processing. Perserverating verbally and with motor tasks. Non sensical speech at times. ?  ?  ? ?  ?General Comments General comments (skin integrity, edema, etc.): VSS on RA. Continuous EEG ? ?  ?Exercises    ? ?Assessment/Plan  ?  ?PT Assessment Patient needs continued PT services  ?PT Problem List Decreased strength;Decreased range of motion;Decreased mobility;Decreased safety awareness;Impaired tone;Decreased cognition;Decreased balance;Pain;Decreased activity tolerance;Decreased coordination ? ?   ?  ?PT Treatment Interventions Therapeutic exercise;Wheelchair mobility training;Patient/family education;Therapeutic activities;Functional mobility training;Cognitive remediation;Neuromuscular re-education;Balance training   ? ?PT  Goals (Current goals can be found in the Care Plan section)  ?Acute Rehab PT Goals ?Patient Stated Goal: unable to state ?PT Goal Formulation: Patient unable to participate in goal setting ?Time For Goal Achievement: 07/27/21 ?Potential to Achieve Goals: Fair ? ?  ?Frequency Min 3X/week ?  ? ? ?Co-evaluation PT/OT/SLP Co-Evaluation/Treatment: Yes ?Reason for Co-Treatment: Complexity of the patient's impairments (multi-system involvement);Necessary to address cognition/behavior during functional activity;For patient/therapist safety ?PT goals addressed during session: Mobility/safety with mobility;Balance ?  ?  ? ? ?  ?AM-PAC PT "6 Clicks" Mobility  ?Outcome Measure Help needed turning from your back to your side while in a flat bed without using bedrails?: Total ?Help needed moving from lying on your back to sitting on the side of a flat bed without using bedrails?: Total ?Help needed moving to and from a bed to a chair (including a wheelchair)?: Total ?Help needed standing up from a chair using your arms (e.g., wheelchair or bedside chair)?: Total ?Help needed to walk in hospital room?: Total ?Help needed climbing 3-5 steps with a railing? : Total ?6 Click Score: 6 ? ?  ?End of Session   ?Activity Tolerance: Patient limited by lethargy ?Patient left: in bed;with call bell/phone within reach;with bed alarm set ?Nurse Communication: Mobility status ?PT Visit Diagnosis: Other abnormalities of gait and mobility (R26.89);Other symptoms and signs involving the nervous system (R29.898);Difficulty in walking, not elsewhere  classified (R26.2);Muscle weakness (generalized) (M62.81);History of falling (Z91.81) ?  ? ?Time: 5284-1324 ?PT Time Calculation (min) (ACUTE ONLY): 16 min ? ? ?Charges:   PT Evaluation ?$PT Eval Moderate Complexity: 1 Mod ?  ?  ?   ? ? ?Marisa Severin, PT, DPT ?Acute Rehabilitation Services ?Pager [phone] ?Office [phone] ? ? ? ? ?[place] ?07/13/2021, 3:14 PM ? ?

## 2021-07-13 NOTE — Progress Notes (Signed)
EEG complete - results pending 

## 2021-07-13 NOTE — Evaluation (Addendum)
Occupational Therapy Evaluation ?Patient Details ?Name: Tonya Thompson ?MRN: [id number] ?DOB: 1956-03-07 ?Today's Date: 07/13/2021 ? ? ?History of Present Illness Patient is a 66 y/o female who presents on 07/12/21 from [facility] with AMS. Found to have acute metabolic encephalopathy, concern for seizures. D/ced to rehab on 07/12/21 and readmited to hospital same night. PMH includes CVA x6 with right sided weakness, AVM of brain, seizures, recent femur fx of RLE s/p IM nail 07/09/21.  ? ?Clinical Impression ?  ?PTA, pt with recent admission for fall and had discharged to SNF for rehab; requiring assistance for ADLs and transfers. Pt currently requiring Total A for ADLs and bed mobility. Pt with decreased cognition as seen by limited arousal, poor attention, and perseverating both (verbal and motor). Pt would benefit from further acute OT to facilitate safe dc. Recommend dc to SNF for further OT to optimize safety, independence with ADLs, and return to PLOF.  ?   ? ?Recommendations for follow up therapy are one component of a multi-disciplinary discharge planning process, led by the attending physician.  Recommendations may be updated based on patient status, additional functional criteria and insurance authorization.  ? ?Follow Up Recommendations ? Skilled nursing-short term rehab (<3 hours/day)  ?  ?Assistance Recommended at Discharge Frequent or constant Supervision/Assistance  ?Patient can return home with the following Two people to help with walking and/or transfers;Two people to help with bathing/dressing/bathroom;Assistance with cooking/housework;Direct supervision/assist for financial management;Help with stairs or ramp for entrance;Assist for transportation ? ?  ?Functional Status Assessment ? Patient has had a recent decline in their functional status and demonstrates the ability to make significant improvements in function in a reasonable and predictable amount of time.  ?Equipment Recommendations ?  None recommended by OT  ?  ?Recommendations for Other Services   ? ? ?  ?Precautions / Restrictions Precautions ?Precautions: Fall ?Precaution Comments: Rt hemiplegia 2* multiple CVAs, R shoulder sublux, hx of seizures, continuous EEG ?Restrictions ?Weight Bearing Restrictions: Yes ?RLE Weight Bearing: Weight bearing as tolerated  ? ?  ? ?Mobility Bed Mobility ?Overal bed mobility: Needs Assistance ?Bed Mobility: Rolling, Supine to Sit, Sit to Supine ?Rolling: Max assist ?  ?Supine to sit: Total assist, +2 for physical assistance, HOB elevated, +2 for safety/equipment ?Sit to supine: Total assist, +2 for physical assistance, HOB elevated, +2 for safety/equipment ?  ?General bed mobility comments: Total A for helicoptor technique using bed pad to get to EOB, posterior pelvic tilt and posterior lean/bias. No assist provided. ?  ? ?Transfers ?  ?  ?  ?  ?  ?  ?  ?  ?  ?General transfer comment: unable ?  ? ?  ?Balance Overall balance assessment: Needs assistance ?Sitting-balance support: Feet supported, No upper extremity supported ?Sitting balance-Leahy Scale: Zero ?Sitting balance - Comments: Total A for sitting balance, no righting reactions present. ?Postural control: Posterior lean ?  ?  ?  ?  ?  ?  ?  ?  ?  ?  ?  ?  ?  ?  ?   ? ?ADL either performed or assessed with clinical judgement  ? ?ADL Overall ADL's : Needs assistance/impaired ?  ?  ?  ?  ?  ?  ?  ?  ?  ?  ?  ?  ?  ?  ?  ?  ?  ?  ?  ?General ADL Comments: Total A for ADLs due to arousal level and cognition  ? ? ? ?Vision Patient  Visual Report: No change from baseline ?   ?   ?Perception   ?  ?Praxis   ?  ? ?Pertinent Vitals/Pain Pain Assessment ?Pain Assessment: Faces ?Faces Pain Scale: Hurts little more ?Pain Location: R hip with movement ?Pain Descriptors / Indicators: Grimacing, Guarding, Moaning ?Pain Intervention(s): Monitored during session, Limited activity within patient's tolerance, Repositioned  ? ? ? ?Hand Dominance Left ?  ?Extremity/Trunk  Assessment Upper Extremity Assessment ?Upper Extremity Assessment: RUE deficits/detail;Difficult to assess due to impaired cognition ?RUE Deficits / Details: No active movement, shoulder sublux, extensor pattern with wrist and fingers flexed, some digit PROM. ?RUE Coordination: decreased fine motor;decreased gross motor ?  ?Lower Extremity Assessment ?Lower Extremity Assessment: Defer to PT evaluation ?RLE Deficits / Details: No active movement performed due to decreased arousal/cognition, PROM ankle to neutral and able toget into knee flexion slightly EOB. ?LLE Deficits / Details: No active movement performed due to decreased arousal/cognition, PROM ankle to neutral and able toget into knee flexion slightly EOB. ?  ?Cervical / Trunk Assessment ?Cervical / Trunk Assessment: Other exceptions ?Cervical / Trunk Exceptions: cervical spine positioned in left lateral side bend and extension ?  ?Communication Communication ?Communication: Expressive difficulties ?  ?Cognition Arousal/Alertness: Lethargic ?Behavior During Therapy: Flat affect ?Overall Cognitive Status: Difficult to assess ?  ?  ?  ?  ?  ?  ?  ?  ?  ?  ?  ?  ?  ?  ?  ?  ?General Comments: Expressive difficulities noted; slow processing. Perserverating verbally and with motor tasks. Non sensical speech at times. ?  ?  ?General Comments  VSS on RA. Continuous EEG ? ?  ?Exercises   ?  ?Shoulder Instructions    ? ? ?Home Living Family/patient expects to be discharged to:: Skilled nursing facility ?  ?  ?  ?  ?  ?  ?  ?  ?  ?  ?  ?  ?  ?  ?  ?  ?  ?  ? ?  ?Prior Functioning/Environment Prior Level of Function : Patient poor historian/Family not available ?  ?  ?  ?  ?  ?  ?  ?  ?  ? ?  ?  ?OT Problem List: Decreased strength;Decreased range of motion;Decreased activity tolerance;Impaired balance (sitting and/or standing);Decreased coordination;Decreased knowledge of use of DME or AE;Decreased knowledge of precautions;Impaired sensation;Impaired tone;Impaired UE  functional use;Pain ?  ?   ?OT Treatment/Interventions: Self-care/ADL training;Therapeutic exercise;Neuromuscular education;DME and/or AE instruction;Therapeutic activities;Patient/family education;Balance training  ?  ?OT Goals(Current goals can be found in the care plan section) Acute Rehab OT Goals ?Patient Stated Goal: Unstated ?OT Goal Formulation: With patient ?Time For Goal Achievement: 07/27/21 ?Potential to Achieve Goals: Good  ?OT Frequency: Min 2X/week ?  ? ?Co-evaluation PT/OT/SLP Co-Evaluation/Treatment: Yes ?Reason for Co-Treatment: For patient/therapist safety;Complexity of the patient's impairments (multi-system involvement) ?PT goals addressed during session: Mobility/safety with mobility;Balance ?OT goals addressed during session: ADL's and self-care ?  ? ?  ?AM-PAC OT "6 Clicks" Daily Activity     ?Outcome Measure Help from another person eating meals?: A Little ?Help from another person taking care of personal grooming?: A Little ?Help from another person toileting, which includes using toliet, bedpan, or urinal?: Total ?Help from another person bathing (including washing, rinsing, drying)?: A Lot ?Help from another person to put on and taking off regular upper body clothing?: A Lot ?Help from another person to put on and taking off regular lower body clothing?: Total ?6  Click Score: 12 ?  ?End of Session Nurse Communication: Mobility status ? ?Activity Tolerance: Patient limited by fatigue ?Patient left: in bed;with bed alarm set;with call bell/phone within reach ? ?OT Visit Diagnosis: Other abnormalities of gait and mobility (R26.89);Pain;Hemiplegia and hemiparesis ?Hemiplegia - Right/Left: Right ?Hemiplegia - dominant/non-dominant: Dominant ?Hemiplegia - caused by: Cerebral infarction ?Pain - Right/Left: Right ?Pain - part of body: Hip  ?              ?Time: 4103-0131 ?OT Time Calculation (min): 16 min ?Charges:  OT General Charges ?$OT Visit: 1 Visit ?OT Evaluation ?$OT Eval Moderate  Complexity: 1 Mod ? ?Kemani Heidel MSOT, OTR/L ?Acute Rehab ?Pager: [phone] ?Office: [phone] ? ?Daruis Swaim M Domenick Quebedeaux ?07/13/2021, 3:49 PM ?

## 2021-07-13 NOTE — Assessment & Plan Note (Addendum)
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  Discharging back to SNF.

## 2021-07-13 NOTE — Assessment & Plan Note (Signed)
Patient with recent right intratrochanteric hip fracture.  Underwent IM nail by Dr. Lyla Glassing, on 07/09/2021. --Weightbearing as tolerates with walker --Lovenox for DVT prophylaxis --Outpatient follow-up with orthopedics

## 2021-07-13 NOTE — Progress Notes (Signed)
Started cEEG study.  Notified [facility] monitoring.  Tested patient event button. 

## 2021-07-13 NOTE — Consult Note (Addendum)
Neurology Consult H&P ? ?Kem Parkinson Brinley ?MR# [id number] ?07/13/2021 ? ? ?CC: AMS suspected seizure ? ?History is obtained from: chart due to encephalopathy. ? ?HPI: Tonya Thompson is a 66 y.o. female PMHx as reviewed below recently discharged for altered mental status, upon reaching the facility found to be more altered, brought back to the emergency room.  On hospitalist evaluation question of partial seizure. Recommended a load of Keppra.  On VPA with therapeutic level 94.   ? ?ROS:  Unable to assess due to encephalopathy. ? ?Past Medical History:  ?Diagnosis Date  ? Acute kidney injury ([place]) 09/03/2019  ? 05-2019 kidney stone removed  ? Allergy   ? seasonal allergies  ? Arthritis   ? LEFT hand  ? AVM (arteriovenous malformation) brain   ? Cataract   ? bilateral-not a surgical candidate at this time (07/23/2020)  ? COVID-19 virus infection 08/2020  ? GERD (gastroesophageal reflux disease)   ? on meds  ? Hyperlipidemia   ? diet controlled  ? Insulin resistance 02/16/2018  ? Seizures ([place])   ? 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 Stroke [facility])   ? 5 total so far (age [phone])  ? Ureteral stone 05/10/2019  ? has multiple kidney stones noted from Urologist  ? Uses roller walker   ? ? ? ?Family History  ?Problem Relation Age of Onset  ? Heart disease Mother   ? Heart failure Mother   ? Heart attack Mother   ? Diabetes Father   ? Hyperlipidemia Father   ? Dementia Father   ? Prostate cancer Father   ? Melanoma Father   ? Colon polyps Father   ? Hyperlipidemia Paternal Uncle   ? Hypertension Paternal Uncle   ? Dementia Paternal Uncle   ? Heart attack Maternal Grandfather   ? Stroke Paternal Grandmother   ? CAD Maternal Uncle   ? COPD Maternal Uncle   ?     smoker  ? Breast cancer Neg Hx   ? Colon cancer Neg Hx   ? Esophageal cancer Neg Hx   ? Stomach cancer Neg Hx   ? Rectal cancer Neg Hx   ? ? ?Social History:  reports that she has never smoked. She has been exposed to tobacco smoke. She has  never used smokeless tobacco. She reports that she does not drink alcohol and does not use drugs. ? ? ?Prior to Admission medications   ?Medication Sig Start Date End Date Taking? Authorizing Provider  ?acetaminophen (TYLENOL) 325 MG tablet Take 650 mg by mouth every 4 (four) hours as needed for headache or mild pain.   Yes [provider]  ?baclofen (LIORESAL) 10 MG tablet Take 1 tablet up to four times daily as needed ?Patient taking differently: Take 10 mg by mouth 4 (four) times daily as needed for muscle spasms. 09/01/20  Yes Cameron Sprang, MD  ?Calcium Carbonate 500 MG CHEW Chew 500 mg by mouth daily.   Yes [provider]  ?Calcium Citrate-Vitamin D 315-5 MG-MCG TABS Take 1 tablet by mouth daily.   Yes [provider]  ?divalproex (DEPAKOTE ER) 500 MG 24 hr tablet Take 2 tablets every night ?Patient taking differently: Take 1,000 mg by mouth at bedtime. 09/01/20  Yes Cameron Sprang, MD  ?Docosahexaenoic Acid (DHA NATURAL OMEGA-3 PO) Take 1 capsule by mouth daily. Omega-3 DHA (DR/EC) '350mg'$ -'325mg'$ -'90mg'$ -'597mg'$     [provider]  ?enoxaparin (LOVENOX) 30 MG/0.3ML injection Inject 0.3 mLs (30 mg total)  into the skin daily. 07/11/21 08/10/21  Swinteck, Aaron Edelman, MD  ?feeding supplement (ENSURE ENLIVE / ENSURE PLUS) LIQD Take 237 mLs by mouth 2 (two) times daily between meals for 7 days. 07/12/21 07/19/21  Antonieta Pert, MD  ?fexofenadine (ALLEGRA) 180 MG tablet Take 180 mg by mouth daily.    [provider]  ?guaiFENesin (MUCINEX) 600 MG 12 hr tablet Take 600 mg by mouth 2 (two) times daily as needed for cough.    [provider]  ?Multiple Vitamins-Minerals (THEREMS-M) TABS Take 1 tablet by mouth daily.    [provider]  ?nystatin powder Apply 1 application. topically 2 (two) times daily as needed (rash under breast).    [provider]  ?oxyCODONE (OXY IR/ROXICODONE) 5 MG immediate release tablet Take 1 tablet (5 mg total) by mouth every 4 (four) hours  as needed for breakthrough pain ((for MODERATE breakthrough pain)). 07/10/21   Swinteck, Aaron Edelman, MD  ?polyethylene glycol (MIRALAX / GLYCOLAX) 17 g packet Take 17 g by mouth daily as needed for mild constipation. 07/12/21   Antonieta Pert, MD  ?Propylene Glycol (SYSTANE BALANCE) 0.6 % SOLN Place 1 drop into both eyes 2 (two) times daily.    [provider]  ?senna (SENOKOT) 8.6 MG TABS tablet Take 1 tablet (8.6 mg total) by mouth 2 (two) times daily. 07/12/21   Antonieta Pert, MD  ?topiramate (TOPAMAX) 200 MG tablet Take 300 mg by mouth 2 (two) times daily.    [provider]  ?Vitamin D3 (VITAMIN D) 25 MCG tablet Take 1,000 Units by mouth daily.    [provider]  ?zinc oxide 20 % ointment Apply 1 application. topically as needed (after every incontinent episode).    [provider]  ? ? ?Exam: ?Current vital signs: ?BP (!) 144/94 (BP Location: Left Arm)   Pulse (!) 103   Temp 98.2 ?F (36.8 ?C) (Axillary)   Resp 16   Ht 5' 5.5" (1.664 m)   Wt 66.5 kg   SpO2 100%   BMI 24.03 kg/m?  ? ?Physical Exam  ?Constitutional: Appears well-developed and well-nourished.  ?Psych: Unable to evaluate due to encephalopathy eyes: No scleral injection ?HENT: No OP obstruction. ?Head: Normocephalic.  ?Cardiovascular: Normal rate and regular rhythm.  ?Respiratory: Effort normal, symmetric excursions bilaterally, no audible wheezing. ?GI: Soft.  No distension. There is no tenderness.  ?Skin: WDI ? ?Neuro: ?Mental Status: ?Patient is obtunded. ?With noxious stimulation she can answer questions with one to 2 words. ?No signs of aphasia or neglect. ?Visual Fields are full to confrontation.  Actively resists eye opening pupils are equal, round, and reactive to light. ?EOMI without ptosis or diplopia.  ?Facial sensation is symmetric to temperature ?Facial movement is symmetric.  ?Hearing is intact to voice. ?Uvula midline and palate elevates symmetrically. ?Shoulder shrug is symmetric. ?Tongue is midline  without atrophy or fasciculations.  ?Increased tone on the right. Bulk is normal.  Moves left extremities and right lower extremity consistently and occasionally purposefully.  Not moving the right upper extremity which is spastically flexed at the elbow and wrist and internally rotated at the shoulder.  Significant contracture in her right wrist. ?Withdraws to pain. ?Deep Tendon Reflexes: Increased tone on the right.   ?Toes are downgoing bilaterally. ?FNF and HKS unable to test but I have my this morning with press DM via [place] ?Gait - Deferred ? ?I have reviewed labs in epic and the pertinent results are: ?K 2.6 ?VPA 94 ? ?I have reviewed the  images obtained: ?MRI brain though technically limited showed no evidence for acute or subacute infarct. Large area of encephalomalacia involving the left cerebral hemisphere with associated ex vacuo dilatation of the left lateral ventricle, stable. ? ? ?Assessment: Tonya Thompson is a 66 y.o. female PMHx as noted above, seizure on VPA and TPM recently discharged noted to be more encephalopathic and readmitted for possible seizure and was loaded with Keppra.  History is limited due to encephalopathy and though she does not seem to be actively seizing she would benefit from EEG to evaluate further.  Continue current antiseizure medications at this time. ? ? ? ?Impression:  ?Suspected breakthrough seizure ?Metabolic encephalopathy ?Hypokalemia ? ? ?Plan: ?- Continue topiramate '300mg'$  two times daily. ?- Continue valproate ER 1,'000mg'$  at bedtime. ?-Continue levetiracetam 500 mg twice daily. ?- Routine EEG. ?- Recommend metabolic/infectious workup. ? ?Electronically signed by:  ?Lynnae Sandhoff, MD ?Page: [phone] ?07/13/2021, 2:52 AM ? ?If 7pm- 7am, please page neurology on call as listed in [place]. ? ?

## 2021-07-13 NOTE — Assessment & Plan Note (Addendum)
Patient presenting to the ED from SNF with altered mental status, confusion after recent hospitalization for hip fracture.  Patient with history of multiple CVAs, seizure disorder, AVMs of the cerebrum.  CT head and MRI brain with no acute intracranial findings.  Chest x-ray negative for acute cardiopulmonary disease process.  Urinalysis unrevealing.  High suspicion for possible recurrent seizure.  Also consideration for elevated ammonia level during previous hospitalization, was 76 on 07/10/2021.  Evaluated by speech therapy with recommendations of dysphagia 2 diet.  Mentation now improved and close to her typical baseline.

## 2021-07-13 NOTE — Procedures (Signed)
Patient Name: Tonya Thompson  ?MRN: [id number]  ?Epilepsy Attending: Lora Havens  ?Referring Physician/Provider: Gwinda Maine, MD ?Date: 07/13/2021 ?Duration: 21.34 mins ? ?Patient history: 66 y.o. female PMHx as noted above, seizure on VPA and TPM recently discharged noted to be more encephalopathic and readmitted for possible seizure and was loaded with Keppra. EEG to evaluate for seizure. ? ?Level of alertness: Awake ? ?AEDs during EEG study: TPM, LEV ? ?Technical aspects: This EEG study was done with scalp electrodes positioned according to the 10-20 International system of electrode placement. Electrical activity was acquired at a sampling rate of '500Hz'$  and reviewed with a high frequency filter of '70Hz'$  and a low frequency filter of '1Hz'$ . EEG data were recorded continuously and digitally stored.  ? ?Description: EEG showed continuous generalized and maximal left temporal region 3 to 6 Hz theta-delta slowing. Hyperventilation and photic stimulation were not performed.    ? ?ABNORMALITY ?- Continuous slow, generalized and maximal left temporal region ? ?IMPRESSION: ?This study is suggestive of cortical dysfunction arising from left temporal region likely secondary to underlying encephalomalacia. Additionally, there is moderate diffuse encephalopathy, nonspecific etiology. No seizures or epileptiform discharges were seen throughout the recording. ? ?Lora Havens  ? ?

## 2021-07-13 NOTE — Assessment & Plan Note (Addendum)
Patient presenting with a sodium of 131, likely secondary to prerenal azotemia/dehydration.  Received IV fluid hydration with improvement.  Etiology likely secondary to poor oral intake.  Improved with IV fluid hydration.  Sodium level 135 at time of discharge.  Recommend repeat BMP 1 week.

## 2021-07-13 NOTE — Progress Notes (Signed)
NEUROLOGY CONSULTATION PROGRESS NOTE  ? ?Date of service: July 13, 2021 ?Patient Name: Tonya Thompson ?MRN:  [id number] ?DOB:  Jan 22, 1956 ? 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  She was recently discharged after a femoral fracture but was sent back to the hospital by SNF due to altered mental status.  ?  ?Interval Hx  ? ?Patient was examined by resident physician earlier this morning and was found to be responsive only to pain.  On later assessment by the attending physician, the patient was more alert and able to follow commands though still significantly encephalopathic.  She was not able to form logical sentences and would repeat the same word over and over again.  ? ?Called patient's relative at the number listed in the chart.  She reports that the patient has been living in an assisted living facility for the past several years.  She states that the patient is able to ambulate independently with a brace and is able to engage in conversation but has some verbal deficits.  The patient is normally alert and oriented. She denies recurrent encephalopathic episodes and states the patient has been seizure-free since around the year 2000.  Relative updated on care plan. ? ?Vitals  ? ?Vitals:  ? 07/12/21 2142 07/12/21 2332 07/13/21 0332 07/13/21 0811  ?BP: (!) 149/89 (!) 144/94 (!) 139/91 (!) 148/96  ?Pulse:  (!) 103 89 (!) 101  ?Resp:  '16 17 14  '$ ?Temp: 98.9 ?F (37.2 ?C) 98.2 ?F (36.8 ?C) 97.8 ?F (36.6 ?C) 98.7 ?F (37.1 ?C)  ?TempSrc: Axillary Axillary Axillary Axillary  ?SpO2:  100% 100% 100%  ?Weight: 66.5 kg     ?Height: '5\' 5"'$  (1.651 m)     ?  ? ?Body mass index is 24.4 kg/m?. ? ?Physical Exam  ? ?General: Sick appearing elderly female, lying in bed, arouses to voice ?HENT: Normal oropharynx and mucosa. Normal external  appearance of ears and nose.  ?Neck: Supple, no pain or tenderness  ?CV: No peripheral edema.  ?Pulmonary: Symmetric Chest rise. Normal respiratory effort.  ?Ext: No cyanosis, edema, or deformity  ?Skin: No rash. Normal palpation of skin.   ? ? ?Neurologic Examination  ?Mental status/Cognition: Alert, unable to answer questions in a logical manner, able to follow some commands such as "lift your arm" ?Cranial nerves:  ? CN II Pupils equal and reactive to light  ? CN III,IV,VI Able to look towards examiner  ? CN V untested  ? CN VII no asymmetry, no nasolabial fold flattening   ? CN VIII normal hearing to speech   ? CN IX & X untested  ? CN XI untested  ? CN XII untested  ? ?Motor:  ? ?Mvmt Root Nerve  Muscle Right Left Comments  ?SA C5/6 Ax Deltoid 1/5 1/5   ?EF C5/6 Mc Biceps 1/5 1/5   ?EE C6/7/8 Rad Triceps 1/5 1/5   ?WF C6/7 Med FCR     ?WE C7/8 PIN ECU     ?F Ab C8/T1 U ADM/FDI     ?HF L1/2/3 Fem Illopsoas     ?KE L2/3/4 Fem Quad     ?DF L4/5 D Peron Tib Ant     ?PF S1/2 Tibial Grc/Sol     ? ?Reflexes: ? Right Left Comments  ?Pectoralis     ? Biceps (  C5/6)     ?Brachioradialis (C5/6)     ? Triceps (C6/7)     ? Patellar (L3/4) 1+ 1+   ? Achilles (S1)     ? Hoffman     ? Plantar     ?Jaw jerk   ? ?Sensation: ? Light touch intact  ? Pin prick   ? Temperature   ? Vibration   ?Proprioception   ? ?Coordination/Complex Motor:  ?Unable to test ? ?Labs  ? ?Basic Metabolic Panel:  ?Lab Results  ?Component Value Date  ? NA 137 07/13/2021  ? K 3.1 (L) 07/13/2021  ? CO2 16 (L) 07/13/2021  ? GLUCOSE 102 (H) 07/13/2021  ? BUN 26 (H) 07/13/2021  ? CREATININE 0.83 07/13/2021  ? CALCIUM 8.5 (L) 07/13/2021  ? GFRNONAA >60 07/13/2021  ? GFRAA 47 (L) 06/26/2020  ? ?HbA1c:  ?Lab Results  ?Component Value Date  ? HGBA1C 4.8 12/19/2020  ? ?LDL:  ?Lab Results  ?Component Value Date  ? [place] 85 07/09/2021  ? ?Urine Drug Screen: No results found for: LABOPIA, COCAINSCRNUR, [place], [place], THCU, LABBARB  ?Alcohol Level  ?   ?Component  Value Date/Time  ? ETH <10 07/12/2021 1554  ? ?No results found for: PHENYTOIN, ZONISAMIDE, LAMOTRIGINE, LEVETIRACETA ?Lab Results  ?Component Value Date  ? VALPROATE 94 07/12/2021  ? ? ?Imaging and Diagnostic studies  ? ? ?CT-H ?IMPRESSION: ?1. No acute intracranial pathology. ?2. 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 ? ?MR BRAIN WO CONTRAST ?IMPRESSION: ?1. Technically limited exam due to the patient's inability to ?tolerate the full length of the study. Axial and coronal DWI ?sequences only were performed. ?2. No evidence for acute or subacute infarct. ?3. Large area of encephalomalacia involving the left cerebral ?hemisphere with associated ex vacuo dilatation of the left lateral ?ventricle, stable. ? ? ? ?Impression  ? 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  She was recently discharged after a femoral fracture but was sent back to the hospital by SNF due to altered mental status.   ? ?The patient's decreased level of consciousness and waxing and waning mentation with her history of seizure disorder is concerning for breakthrough seizures as a cause of her altered mental status.  It is also notable that the patient has an ammonia level of 76.  She is taking Depakote and this could also be a cause of her altered mental status.  Per chart review, the patient has been taking Depakote since 07/30/2020, when it was prescribed by Dr. Delice Lesch.  Per her notes, the medication was prescribed solely for migraine prophylaxis.  The patient appears to have been maintained seizure-free on Topamax for many years.  ? ?Assessment: ?Breakthrough seizures vs. hyperammonemic encephalopathy 2/2 Depakote ? ?Recommendations  ? ?-Continuous EEG ?-Discontinue Depakote 500 mg  q12hrs ?-Continue Keppra 500 mg q12hrs in place of Topamax 300 mg BID ? ? ?Corky Sox, MD ?PGY-1 ? ?I have seen the patient and reviewed the resident's note.  On the residents initial exam, the patient was relatively obtunded, but on subsequent check she was verbal though had significant perseveration, followed commands but not reliably. ? ?Given the variance in exam, I would favor prolonged EEG monitoring to rule out recurrent partial seizures as etiology of her waxing/waning mental status. ? ?Another consideration  would be hyperammonemic encephalopathy associated with Depakote, which could be supported by her ammonia of 76.  Given that she was seizure-free prior to initiation of Depakote, I would favor discontinuing it at this time.  Given there is concern for seizures, I would continue Keppra until we have more data from EEG. ? ?I would continue her Topamax at the current dose. ? ?Neurology will continue to follow ? ?Roland Rack, MD ?[facility] ?[phone] ? ?If 7pm- 7am, please page neurology on call as listed in [place]. ? ? ?

## 2021-07-13 NOTE — Plan of Care (Signed)
?  Problem: Education: ?Goal: Knowledge of General Education information will improve ?Description: Including pain rating scale, medication(s)/side effects and non-pharmacologic comfort measures ?Outcome: Not Progressing ?  ?Problem: Health Behavior/Discharge Planning: ?Goal: Ability to manage health-related needs will improve ?Outcome: Not Progressing ?  ?Problem: Clinical Measurements: ?Goal: Ability to maintain clinical measurements within normal limits will improve ?Outcome: Not Progressing ?Goal: Will remain free from infection ?Outcome: Progressing ?Goal: Cardiovascular complication will be avoided ?Outcome: Progressing ?  ?Problem: Activity: ?Goal: Risk for activity intolerance will decrease ?Outcome: Not Progressing ?  ?Problem: Coping: ?Goal: Level of anxiety will decrease ?Outcome: Not Applicable ?  ?

## 2021-07-13 NOTE — Assessment & Plan Note (Addendum)
Ammonia level was noted to be elevated on 07/10/2021, 76.  Neurology discontinued Depakote as this could be playing a factor in its elevation. Repeat ammonia decreased to 46 07/14/2021

## 2021-07-13 NOTE — Progress Notes (Signed)
Pt confused and unable to follow direction, pt was trying to climb out of machine, unable to calm pt, she was sent back to the floor. ?

## 2021-07-13 NOTE — Assessment & Plan Note (Addendum)
Apparently patient has been seizure-free since around 2000.  EEG with evidence of cortical dysfunction arising from the left temporal region likely secondary to underlying encephalomalacia, moderate diffuse encephalopathy that is nonspecific and no seizures or epileptiform discharges were noted during recording.  Neurology discontinued Keppra; also Depakote was discontinued due to elevated ammonia level.  Neurology now signed off with recommendations to follow-up with her primary neurologist, Dr. Karel Jarvis.  Continue Topamax 300 mg PO BID

## 2021-07-13 NOTE — [place] FL2 (Signed)
?[place] MEDICAID FL2 LEVEL OF CARE SCREENING TOOL  ?  ? ?IDENTIFICATION  ?Patient Name: ?Tonya Thompson Birthdate: 21-Mar-1956 Sex: female Admission Date (Current Location): ?07/12/2021  ?[place] and [place] Number: ? [place] ?  Facility and Address:  ?[facility], [place] [address], [place], [place] [address] ?     Provider Number: ?[id number]  ?Attending Physician Name and Address:  ?[place] Eric J, DO ? Relative Name and Phone Number:  ?  ?   ?Current Level of Care: ?Hospital Recommended Level of Care: ?[place] Prior Approval Number: ?  ? ?Date Approved/Denied: ?  PASRR Number: ?[id number] ? ?Discharge Plan: ?SNF ?  ? ?Current Diagnoses: ?Patient Active Problem List  ? Diagnosis Date Noted  ? Acute metabolic encephalopathy 38/25/0539  ? Leukocytosis 07/11/2021  ? Malnutrition of moderate degree 07/10/2021  ? Metabolic acidosis 76/73/4193  ? Thrombocytopenia ([place]) 07/09/2021  ? Closed intertrochanteric fracture of right femur ([place]) 07/08/2021  ? Macrocytosis 07/08/2021  ? Abnormal LFTs 07/08/2021  ? Compression fracture of T3 vertebra ([place]) 07/08/2021  ? Impaired cognition 06/09/2021  ? Migraine 06/09/2021  ? Gait abnormality 06/09/2021  ? Coronary artery calcification 04/30/2021  ? Abnormal TSH 04/04/2021  ? Elevated LFTs 04/04/2021  ? Deterioration in renal function 04/04/2021  ? Dense breasts 12/19/2020  ? History of CVA (cerebrovascular accident) 12/18/2020  ? Pulmonary nodules 12/18/2020  ? History of colon polyps 06/26/2020  ? History of renal stone 09/04/2019  ? Medication management 02/16/2018  ? Osteopenia 01/10/2018  ? Acute renal failure superimposed on stage 3b chronic kidney disease ([place]) 11/10/2017  ? 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 Hyperlipidemia, mixed 06/01/2017  ? Abnormal glucose 06/01/2017  ? Vitamin D deficiency 06/01/2017  ? Epilepsy, grand mal ([place]) 11/15/2016  ? 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  Headache, unspecified headache type 04/14/2016  ? Right hemiparesis ([place]) 12/29/2015  ? ? ?Orientation RESPIRATION BLADDER Height & Weight   ?  ?Self ? Normal Incontinent Weight: 146 lb 9.7 oz (66.5 kg) ?Height:  '5\' 5"'$  (165.1 cm)  ?BEHAVIORAL SYMPTOMS/MOOD NEUROLOGICAL BOWEL NUTRITION STATUS  ?  Convulsions/Seizures Continent Diet (see DC summary)  ?AMBULATORY STATUS COMMUNICATION OF NEEDS Skin   ?Limited Assist Verbally Surgical wounds (right hip, foam dressing: lift every shift to assess, change PRN) ?  ?  ?  ?    ?     ?     ? ? ?Personal Care Assistance Level of Assistance  ?Bathing, Feeding, Dressing Bathing Assistance: Limited assistance ?Feeding assistance: Limited assistance ?Dressing Assistance: Limited assistance ?   ? ?Functional Limitations Info  ?    ?  ?   ? ? ?SPECIAL CARE FACTORS FREQUENCY  ?PT (By licensed PT), OT (By licensed OT), Speech therapy   ?  ?PT Frequency: 5x/wk ?OT Frequency: 5x/wk ?  ?  ?Speech Therapy Frequency: 5x/wk ?   ? ? ?Contractures Contractures Info: Not present  ? ? ?Additional Factors Info  ?Code Status, Allergies Code Status Info: Full ?Allergies Info: Aspirin, Cheese, Chocolate, Coffee Flavor, Other, Red Wine Complex (Germanium) ?  ?  ?  ?   ? ?Current Medications (07/13/2021):  This is the current hospital active medication list ?Current Facility-Administered Medications  ?Medication Dose Route Frequency Provider Last Rate Last Admin  ? acetaminophen (TYLENOL) tablet 650 mg  650 mg Oral Q6H PRN Charlynne Cousins, MD      ? Or  ? acetaminophen (TYLENOL) suppository 650 mg  650  mg Rectal Q6H PRN Charlynne Cousins, MD      ? dextrose 5 % and 0.45% NaCl 1,000 mL with potassium chloride 30 mEq infusion   Intravenous Continuous Charlynne Cousins, MD 75 mL/hr at 07/12/21 2334 New Bag at 07/12/21 2334  ? [START ON 07/14/2021] enoxaparin (LOVENOX) injection 40 mg  40 mg Subcutaneous Q24H [place] Donnamarie Poag, DO      ? levETIRAcetam (KEPPRA) IVPB 500 mg/100 mL premix  500 mg  Intravenous Q12H Charlynne Cousins, MD      ? ondansetron [facility]) tablet 4 mg  4 mg Oral Q6H PRN Charlynne Cousins, MD      ? Or  ? ondansetron [facility]) injection 4 mg  4 mg Intravenous Q6H PRN Charlynne Cousins, MD      ? polyethylene glycol (MIRALAX / GLYCOLAX) packet 17 g  17 g Oral Daily PRN Charlynne Cousins, MD      ? polyvinyl alcohol (LIQUIFILM TEARS) 1.4 % ophthalmic solution 1 drop  1 drop Both Eyes BID Charlynne Cousins, MD   1 drop at 07/13/21 0944  ? potassium chloride 10 mEq in 100 mL IVPB  10 mEq Intravenous Q1 Hr x 4 [place] Eric J, DO 100 mL/hr at 07/13/21 0942 10 mEq at 07/13/21 0762  ? ? ? ?Discharge Medications: ?Please see discharge summary for a list of discharge medications. ? ?Relevant Imaging Results: ? ?Relevant Lab Results: ? ? ?Additional Information ?SS#: [id number] ? ?Geralynn Ochs, LCSW ? ? ? ? ?

## 2021-07-13 NOTE — Progress Notes (Signed)
PROGRESS NOTE    Tonya Thompson  HYI:[id number] DOB: 1956/01/14 DOA: 07/12/2021 PCP: Unk Pinto, MD    Brief Narrative:  Tonya Thompson is a 66 year old female with past medical history significant for cerebral AVMs, history of CVA x5 with resulting right-sided hemiparesis, history of seizure, CKD stage IIIb, urolithiasis, GERD, HLD and recent right femur fracture s/p intramedullary fixation who [facility] for altered mental status.  Patient was just discharged from the hospital following admission for hip fracture.  After arriving at the facility, staff noted that she seemed more confused than her reported baseline; and was immediately sent back to the ED for further evaluation.  In the ED, temperature 97.5 F, HR 103, RR 18, BP 124/93, SPO2 98% on room air. WBC 11.3, hemoglobin 9.9, platelets 95.  Sodium 133, potassium 2.6, chloride 109, CO2 18, glucose 93, BUN 31, creatinine 0.92.  AST 31, ALT 10.  Total bilirubin 0.8.  Urinalysis with large leukocytes, negative nitrite, few bacteria, 11-20 WBCs.  EtOH level less than 10.  CT head without contrast with no acute intracranial pathology, extensive encephalomalacia of the left cerebral hemisphere with ex vacuo dilation of the left lateral ventricle.  Chest x-ray with no acute cardiopulmonary disease process.  MR brain without contrast with no evidence for acute or subacute infarct, large area of encephalomalacia involving the left cerebral hemisphere, limited due to inability to tolerate full length of the study.  EDP discussed with on-call neurologist who recommended transfer to [facility] for EEG for concern of seizure.  Hospital service was consulted and patient was transferred to [facility] for further evaluation and management.     Assessment & Plan:   Assessment and Plan: * Acute metabolic encephalopathy Patient presenting to the ED from SNF with altered mental status, confusion after  recent hospitalization for hip fracture.  Patient with history of multiple CVAs, seizure disorder, AVMs of the cerebrum.  CT head and MRI brain with no acute intracranial findings.  Chest x-ray negative for acute cardiopulmonary disease process.  Urinalysis unrevealing.  High suspicion for possible recurrent seizure.  Also consideration for elevated ammonia level during previous hospitalization, was 76 on 07/10/2021. --Supportive care; EEG as below; repeat ammonia level in a.m. --SLP consulted for swallow evaluation given her AMS --Add on urine culture  Seizure disorder ([place]) Apparently patient has been seizure-free since around 2000. --Neurology following, appreciate assistance --EEG: Pending --Depakote discontinued due to elevated ammonia level --Topamax 300 mg PO BID --Keppra 500 mg BID  Right hemiparesis (HCC) Continues with resultant right-sided hemiparesis from multiple CVAs in the past.  [facility] --PT/OT evaluation  Closed intertrochanteric fracture of right femur [facility]) Patient with recent right intratrochanteric hip fracture.  Underwent IM nail by Dr. Lyla Glassing, on 07/09/2021. --Weightbearing as tolerates with walker --Lovenox for DVT prophylaxis --Outpatient follow-up with orthopedics  Hyponatremia Patient presenting with a sodium of 131, likely secondary to prerenal azotemia/dehydration.  Received IV fluid hydration with improvement of sodium to 137, now resolved. --BMP in a.m.  Hypokalemia Potassium 2.6 on admission.  Repleted.  Potassium is at 3.1 this morning, will continue to replete. --Repeat electrolytes in the a.m. to include magnesium  Hyperammonemia (HCC) Ammonia level was noted to be elevated on 07/10/2021, 76.  Neurology discontinued Depakote as this could be playing a factor in its elevation. --Repeat ammonia level in a.m.     DVT prophylaxis: enoxaparin (LOVENOX) injection 40 mg Start: 07/14/21  1000    Code Status: Full  Code Family Communication: No family present at bedside this morning  Disposition Plan:  Level of care: Telemetry Medical Status is: Inpatient Remains inpatient appropriate because: Pending EEG, PT/OT evaluation, awaiting further recommendations per neurology.    Consultants:  Neurology  Procedures:  EEG: Pending  Antimicrobials:  None   Subjective: Patient seen examined at bedside, resting comfortably.  Alert but remains confused.  Apparently alert and oriented at baseline.  Only answering orientation questions with "2023".  No specific complaints this morning.  Denies headache, no chest pain, no shortness of breath, no abdominal pain.  No acute events overnight per nursing staff.  Objective: Vitals:   07/12/21 2142 07/12/21 2332 07/13/21 0332 07/13/21 0811  BP: (!) 149/89 (!) 144/94 (!) 139/91 (!) 148/96  Pulse:  (!) 103 89 (!) 101  Resp:  '16 17 14  '$ Temp: 98.9 F (37.2 C) 98.2 F (36.8 C) 97.8 F (36.6 C) 98.7 F (37.1 C)  TempSrc: Axillary Axillary Axillary Axillary  SpO2:  100% 100% 100%  Weight: 66.5 kg     Height: '5\' 5"'$  (1.651 m)       Intake/Output Summary (Last 24 hours) at 07/13/2021 1248 Last data filed at 07/13/2021 0300 Gross per 24 hour  Intake 1164.56 ml  Output --  Net 1164.56 ml   Filed Weights   07/12/21 1413 07/12/21 2142  Weight: 65 kg 66.5 kg    Examination:  Physical Exam: GEN: NAD, alert and oriented to time (2023), but not place, person or situation; chronically ill in appearance HEENT: NCAT, PERRL, EOMI, sclera clear, dry mucous membranes PULM: CTAB w/o wheezes/crackles, normal respiratory effort, on room air CV: RRR w/o M/G/R GI: abd soft, NTND, NABS, no R/G/M MSK: no peripheral edema, muscle strength globally intact 5/5 bilateral upper/lower extremities NEURO: Right-sided hemiparesis with muscle strength 4/5 LLE, 3/5 RLE PSYCH: normal mood/affect Integumentary: dry/intact, no rashes or wounds    Data Reviewed: I have personally  reviewed following labs and imaging studies  CBC: Recent Labs  Lab 07/08/21 1052 07/09/21 0456 07/10/21 0458 07/11/21 0600 07/12/21 0539 07/12/21 1554 07/13/21 0304  WBC 6.5   < > 17.4* 17.4* 16.1* 11.3* 13.8*  NEUTROABS 5.0  --   --   --   --  9.1* 10.9*  HGB 15.3*   < > 13.4 12.3 12.8 9.9* 12.7  HCT 45.3   < > 40.2 34.8* 35.5* 29.6* 37.2  MCV 106.3*   < > 108.9* 103.0* 100.0 106.9* 105.7*  PLT 93*   < > 68* 73* 98* 95* 128*   < > = values in this interval not displayed.   Basic Metabolic Panel: Recent Labs  Lab 07/08/21 1052 07/09/21 0456 07/10/21 0458 07/11/21 0600 07/12/21 1554 07/12/21 2320 07/13/21 0304  NA 139 137 136 135 133*  --  137  K 3.9 3.8 3.9 4.3 2.6*  --  3.1*  CL 106 109 109 109 109  --  113*  CO2 27 20* 17* 19* 18*  --  16*  GLUCOSE 99 153* 134* 80 93  --  102*  BUN 26* 29* 39* 38* 31*  --  26*  CREATININE 1.32* 1.47* 1.42* 1.10* 0.92  --  0.83  CALCIUM 9.4 8.9 8.3* 8.2* 7.9*  --  8.5*  MG 2.4  --   --  2.0  --  1.9  --   PHOS 4.2  --   --   --   --  1.9*  --  GFR: Estimated Creatinine Clearance: 62.4 mL/min (by C-G formula based on SCr of 0.83 mg/dL). Liver Function Tests: Recent Labs  Lab 07/08/21 1052 07/09/21 0456 07/12/21 1554  AST 57* 41 31  ALT 34 28 10  ALKPHOS 80 63 69  BILITOT 0.7 0.6 0.8  PROT 6.5 5.6* 4.9*  ALBUMIN 3.3* 2.8* 2.1*   No results for input(s): LIPASE, AMYLASE in the last 168 hours. Recent Labs  Lab 07/10/21 1828  AMMONIA 76*   Coagulation Profile: No results for input(s): INR, PROTIME in the last 168 hours. Cardiac Enzymes: Recent Labs  Lab 07/12/21 2320  CKTOTAL 41   BNP (last 3 results) No results for input(s): PROBNP in the last 8760 hours. HbA1C: No results for input(s): HGBA1C in the last 72 hours. CBG: Recent Labs  Lab 07/09/21 1544 07/09/21 1815  GLUCAP 118* 127*   Lipid Profile: No results for input(s): CHOL, HDL, LDLCALC, TRIG, CHOLHDL, LDLDIRECT in the last 72 hours. Thyroid  Function Tests: No results for input(s): TSH, T4TOTAL, FREET4, T3FREE, THYROIDAB in the last 72 hours. Anemia Panel: No results for input(s): VITAMINB12, FOLATE, FERRITIN, TIBC, IRON, RETICCTPCT in the last 72 hours. Sepsis Labs: Recent Labs  Lab 07/12/21 2320  LATICACIDVEN 1.1    Recent Results (from the past 240 hour(s))  Resp Panel by RT-PCR (Flu A&B, Covid) Nasopharyngeal Swab     Status: None   Collection Time: 07/08/21 11:26 AM   Specimen: Nasopharyngeal Swab; Nasopharyngeal(NP) swabs in vial transport medium  Result Value Ref Range Status   SARS Coronavirus 2 by RT PCR NEGATIVE NEGATIVE Final    Comment: (NOTE) SARS-CoV-2 target nucleic acids are NOT DETECTED.  The SARS-CoV-2 RNA is generally detectable in upper respiratory specimens during the acute phase of infection. The lowest concentration of SARS-CoV-2 viral copies this assay can detect is 138 copies/mL. A negative result does not preclude SARS-Cov-2 infection and should not be used as the sole basis for treatment or other patient management decisions. A negative result may occur with  improper specimen collection/handling, submission of specimen other than nasopharyngeal swab, presence of viral mutation(s) within the areas targeted by this assay, and inadequate number of viral copies(<138 copies/mL). A negative result must be combined with clinical observations, patient history, and epidemiological information. The expected result is Negative.  Fact Sheet for Patients:  EntrepreneurPulse.com.au  Fact Sheet for Healthcare Providers:  IncredibleEmployment.be  This test is no t yet approved or cleared by the [place] FDA and  has been authorized for detection and/or diagnosis of SARS-CoV-2 by FDA under an Emergency Use Authorization (EUA). This EUA will remain  in effect (meaning this test can be used) for the duration of the COVID-19 declaration under Section 564(b)(1) of  the Act, 21 U.S.C.section 360bbb-3(b)(1), unless the authorization is terminated  or revoked sooner.       Influenza A by PCR NEGATIVE NEGATIVE Final   Influenza B by PCR NEGATIVE NEGATIVE Final    Comment: (NOTE) The Xpert Xpress SARS-CoV-2/FLU/RSV plus assay is intended as an aid in the diagnosis of influenza from Nasopharyngeal swab specimens and should not be used as a sole basis for treatment. Nasal washings and aspirates are unacceptable for Xpert Xpress SARS-CoV-2/FLU/RSV testing.  Fact Sheet for Patients: EntrepreneurPulse.com.au  Fact Sheet for Healthcare Providers: IncredibleEmployment.be  This test is not yet approved or cleared by the [place] FDA and has been authorized for detection and/or diagnosis of SARS-CoV-2 by FDA under an Emergency Use Authorization (EUA). This EUA will remain in  effect (meaning this test can be used) for the duration of the COVID-19 declaration under Section 564(b)(1) of the Act, 21 U.S.C. section 360bbb-3(b)(1), unless the authorization is terminated or revoked.  Performed at [facility], [place] [address], [place], [place] [address]   Surgical PCR screen     Status: None   Collection Time: 07/08/21  4:17 PM   Specimen: Nasal Mucosa; Nasal Swab  Result Value Ref Range Status   MRSA, PCR NEGATIVE NEGATIVE Final   Staphylococcus aureus NEGATIVE NEGATIVE Final    Comment: (NOTE) The Xpert SA Assay (FDA approved for NASAL specimens in patients 32 years of age and older), is one component of a comprehensive surveillance program. It is not intended to diagnose infection nor to guide or monitor treatment. Performed at [facility], [place] [address], [place], [place] [address]   SARS CORONAVIRUS 2 (TAT 6-24 HRS) Nasopharyngeal Nasopharyngeal Swab     Status: None   Collection Time: 07/11/21  3:00 PM   Specimen: Nasopharyngeal Swab  Result Value Ref Range Status    SARS Coronavirus 2 NEGATIVE NEGATIVE Final    Comment: (NOTE) SARS-CoV-2 target nucleic acids are NOT DETECTED.  The SARS-CoV-2 RNA is generally detectable in upper and lower respiratory specimens during the acute phase of infection. Negative results do not preclude SARS-CoV-2 infection, do not rule out co-infections with other pathogens, and should not be used as the sole basis for treatment or other patient management decisions. Negative results must be combined with clinical observations, patient history, and epidemiological information. The expected result is Negative.  Fact Sheet for Patients: SugarRoll.be  Fact Sheet for Healthcare Providers: https://www.woods-mathews.com/  This test is not yet approved or cleared by the [place] FDA and  has been authorized for detection and/or diagnosis of SARS-CoV-2 by FDA under an Emergency Use Authorization (EUA). This EUA will remain  in effect (meaning this test can be used) for the duration of the COVID-19 declaration under Se ction 564(b)(1) of the Act, 21 U.S.C. section 360bbb-3(b)(1), unless the authorization is terminated or revoked sooner.  Performed at [facility], [place] [address], [place], [place] [address]          Radiology Studies: CT HEAD WO CONTRAST  Result Date: 07/12/2021 CLINICAL DATA:  Altered mental status EXAM: CT HEAD WITHOUT CONTRAST TECHNIQUE: Contiguous axial images were obtained from the base of the skull through the vertex without intravenous contrast. RADIATION DOSE REDUCTION: This exam was performed according to the departmental dose-optimization program which includes automated exposure control, adjustment of the mA and/or kV according to patient size and/or use of iterative reconstruction technique. COMPARISON:  MR brain, 04/13/2021 FINDINGS: Brain: No evidence of acute infarction, hemorrhage, hydrocephalus, extra-axial collection or mass lesion/mass  effect. 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 Vascular: No hyperdense vessel or unexpected calcification. Skull: Status post left pterional craniotomy. Negative for fracture or focal lesion. Sinuses/Orbits: No acute finding. Other: None. IMPRESSION: 1. No acute intracranial pathology. 2. 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 Electronically Signed   By: Delanna Ahmadi M.D.   On: 07/12/2021 15:13   MR BRAIN WO CONTRAST  Result Date: 07/13/2021 CLINICAL DATA:  Initial evaluation for acute confusion, encephalopathy. EXAM: MRI HEAD WITHOUT CONTRAST TECHNIQUE: Multiplanar, multiecho pulse sequences of the brain and surrounding structures were obtained without intravenous contrast. COMPARISON:  Prior CT from 07/12/2021  as well as previous MRI from 04/13/2021. FINDINGS: Brain: Examination technically limited as the patient was unable to tolerate the full length of the exam. Axial and coronal DWI sequences only were performed. Diffusion-weighted imaging demonstrates no evidence for acute or subacute infarct. Large area of encephalomalacia of involving the left cerebral hemisphere with associated ex vacuo dilatation of the left lateral ventricle again seen. No visible mass lesion, mass effect, or midline shift. No new or progressive hydrocephalus. No extra-axial fluid collection. Vascular: Not well assessed on this limited exam. Skull and upper cervical spine: Not well assessed on this limited exam. Sinuses/Orbits: Not well assessed on this limited exam. Other: None. IMPRESSION: 1. Technically limited exam due to the patient's inability to tolerate the full length of the study. Axial and coronal DWI sequences only were performed. 2. No evidence for acute or subacute infarct. 3. Large area of encephalomalacia involving the left cerebral hemisphere  with associated ex vacuo dilatation of the left lateral ventricle, stable. Electronically Signed   By: Jeannine Boga M.D.   On: 07/13/2021 02:14   DG Chest Port 1 View  Result Date: 07/12/2021 CLINICAL DATA:  Altered level of consciousness EXAM: PORTABLE CHEST 1 VIEW COMPARISON:  07/08/2021 FINDINGS: The heart size and mediastinal contours are within normal limits. Both lungs are clear. The visualized skeletal structures are unremarkable. IMPRESSION: No acute abnormality of the lungs in AP portable projection. Electronically Signed   By: Delanna Ahmadi M.D.   On: 07/12/2021 14:54        Scheduled Meds:  [START ON 07/14/2021] enoxaparin  40 mg Subcutaneous Q24H   polyvinyl alcohol  1 drop Both Eyes BID   topiramate  300 mg Oral BID   Continuous Infusions:  dextrose 5 % and 0.45% NaCl 1,000 mL with potassium chloride 30 mEq infusion 75 mL/hr at 07/12/21 2334   levETIRAcetam       LOS: 1 day    Time spent: 46 minutes spent on chart review, discussion with nursing staff, consultants, updating family and interview/physical exam; more than 50% of that time was spent in counseling and/or coordination of care.    Mckenze Slone J [place] DO [facility] Available via Epic secure chat 7am-7pm After these hours, please refer to coverage provider listed on [email or web address] 07/13/2021, 12:48 PM

## 2021-07-13 NOTE — TOC Initial Note (Signed)
Transition of Care (TOC) - Initial/Assessment Note  ? ? ?Patient Details  ?Name: Tonya Thompson ?MRN: [id number] ?Date of Birth: [birth year] ? ?Transition of Care [facility]) CM/SW Contact:    ?Geralynn Ochs, LCSW ?Phone Number: ?07/13/2021, 10:29 AM ? ?Clinical Narrative:          Patient from [facility], currently in [place] for rehab. Patient will need PT/OT evals for SNF prior to discharge, sent message to MD to request. [facility] will have a bed at [facility] available for patient when medically stable. CSW to follow.       ? ? ?Expected Discharge Plan: [place] ?Barriers to Discharge: Continued Medical Work up ? ? ?Patient Goals and CMS Choice ?Patient states their goals for this hospitalization and ongoing recovery are:: get back to rehab ?CMS Medicare.gov Compare Post Acute Care list provided to:: Patient Represenative (must comment) ?Choice offered to / list presented to : Sibling ? ?Expected Discharge Plan and Services ?Expected Discharge Plan: [place] ?  ?  ?Post Acute Care Choice: [place] ?Living arrangements for the past 2 months: [place] ?                ?  ?  ?  ?  ?  ?  ?  ?  ?  ?  ? ?Prior Living Arrangements/Services ?Living arrangements for the past 2 months: [place] ?Lives with:: Facility Resident ?Patient language and need for interpreter reviewed:: No ?Do you feel safe going back to the place where you live?: Yes      ?Need for Family Participation in Patient Care: Yes (Comment) ?Care giver support system in place?: Yes (comment) ?Current home services: DME ?Criminal Activity/Legal Involvement Pertinent to Current Situation/Hospitalization: No - Comment as needed ? ?Activities of Daily Living ?  ?  ? ?Permission Sought/Granted ?Permission sought to share information with : Facility Sport and exercise psychologist, Family Supports ?Permission granted to share information with : Yes, Verbal Permission Granted ? Share  Information with NAME: Hale Bogus ? Permission granted to share info w AGENCY: SNF ? Permission granted to share info w Relationship: Family ?   ? ?Emotional Assessment ?Appearance:: Appears stated age ?Attitude/Demeanor/Rapport: Unable to Assess ?Affect (typically observed): Unable to Assess ?Orientation: : Oriented to Self ?Alcohol / Substance Use: Not Applicable ?Psych Involvement: No (comment) ? ?Admission diagnosis:  Delirium [R41.0] ?Acute metabolic encephalopathy [L46.50] ?Patient Active Problem List  ? Diagnosis Date Noted  ? Acute metabolic encephalopathy 35/46/5681  ? Leukocytosis 07/11/2021  ? Malnutrition of moderate degree 07/10/2021  ? Metabolic acidosis 27/51/7001  ? Thrombocytopenia ([place]) 07/09/2021  ? Closed intertrochanteric fracture of right femur ([place]) 07/08/2021  ? Macrocytosis 07/08/2021  ? Abnormal LFTs 07/08/2021  ? Compression fracture of T3 vertebra ([place]) 07/08/2021  ? Impaired cognition 06/09/2021  ? Migraine 06/09/2021  ? Gait abnormality 06/09/2021  ? Coronary artery calcification 04/30/2021  ? Abnormal TSH 04/04/2021  ? Elevated LFTs 04/04/2021  ? Deterioration in renal function 04/04/2021  ? Dense breasts 12/19/2020  ? History of CVA (cerebrovascular accident) 12/18/2020  ? Pulmonary nodules 12/18/2020  ? History of colon polyps 06/26/2020  ? History of renal stone 09/04/2019  ? Medication management 02/16/2018  ? Osteopenia 01/10/2018  ? Acute renal failure superimposed on stage 3b chronic kidney disease ([place]) 11/10/2017  ? 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 Hyperlipidemia, mixed 06/01/2017  ? Abnormal glucose 06/01/2017  ? Vitamin D deficiency 06/01/2017  ? 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 Headache, unspecified headache type 04/14/2016  ? Right hemiparesis ([place]) 12/29/2015  ? ?PCP:  Unk Pinto, MD ?Pharmacy:   ?[facility] [place], [place] - [address] ?[address] ?[place] [address] ?Phone: [phone] Fax: [phone] ? ? ? ? ?Social Determinants of Health (SDOH) Interventions ?  ? ?Readmission Risk Interventions ?No flowsheet data found. ? ? ?

## 2021-07-13 NOTE — Evaluation (Addendum)
Clinical/Bedside Swallow Evaluation ?Patient Details  ?Name: Tonya Thompson ?MRN: [id number] ?Date of Birth: 12/06/1955 ? ?Today's Date: 07/13/2021 ?Time: SLP Start Time (ACUTE ONLY): 1406 SLP Stop Time (ACUTE ONLY): 1421 ?SLP Time Calculation (min) (ACUTE ONLY): 15 min ? ?Past Medical History:  ?Past Medical History:  ?Diagnosis Date  ? Acute kidney injury ([place]) 09/03/2019  ? 05-2019 kidney stone removed  ? Allergy   ? seasonal allergies  ? Arthritis   ? LEFT hand  ? AVM (arteriovenous malformation) brain   ? Cataract   ? bilateral-not a surgical candidate at this time (07/23/2020)  ? COVID-19 virus infection 08/2020  ? GERD (gastroesophageal reflux disease)   ? on meds  ? Hyperlipidemia   ? diet controlled  ? Insulin resistance 02/16/2018  ? Seizures ([place])   ? 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 Stroke [facility])   ? 5 total so far (age [phone])  ? Ureteral stone 05/10/2019  ? has multiple kidney stones noted from Urologist  ? Uses roller walker   ? ?Past Surgical History:  ?Past Surgical History:  ?Procedure Laterality Date  ? BRAIN SURGERY  09/19/1978  ? at [facility], Dr. Leida Lauth  ? BRONCHIAL BIOPSY  04/21/2021  ? Procedure: BRONCHIAL BIOPSIES;  Surgeon: Garner Nash, DO;  Location: [place] [facility]OSCOPY;  Service: Pulmonary;;  ? BRONCHIAL BRUSHINGS  04/21/2021  ? Procedure: BRONCHIAL BRUSHINGS;  Surgeon: Garner Nash, DO;  Location: [place] ENDOSCOPY;  Service: Pulmonary;;  ? BRONCHIAL WASHINGS  04/21/2021  ? Procedure: BRONCHIAL WASHINGS;  Surgeon: Garner Nash, DO;  Location: [place] ENDOSCOPY;  Service: Pulmonary;;  ? COLONOSCOPY  2017  ? [place], [place]  ? CYSTOSCOPY/URETEROSCOPY/HOLMIUM LASER/STENT PLACEMENT Left 05/10/2019  ? Procedure: CVELFYBOFB/PZWCHENIDP/OEUMPNTIRWER/XVQMGQQ LASER/STENT PLACEMENT;  Surgeon: Raynelle Bring, MD;  Location: [facility];  Service: Urology;  Laterality: Left;  ? FIDUCIAL MARKER PLACEMENT  04/21/2021  ? Procedure: FIDUCIAL MARKER PLACEMENT;  Surgeon: Garner Nash, DO;   Location: [place] ENDOSCOPY;  Service: Pulmonary;;  ? FOOT SURGERY Right 10/1985  ? ankle fusion, at [facility], Dr. Lorelee Cover  ? POLYPECTOMY  2017  ? multiple adenomas  ? TUBAL LIGATION  1981  ? VIDEO BRONCHOSCOPY WITH RADIAL ENDOBRONCHIAL ULTRASOUND  04/21/2021  ? Procedure: RADIAL ENDOBRONCHIAL ULTRASOUND;  Surgeon: Garner Nash, DO;  Location: [place] ENDOSCOPY;  Service: Pulmonary;;  ? WISDOM TOOTH EXTRACTION    ? ?HPI:  ?[name] is a 66 year old female with past medical history significant for cerebral AVMs, history of CVA x5 with resulting right-sided hemiparesis, seizure, CKD stage IIIb, urolithiasis, GERD, HLD and recent right femur fracture s/p intramedullary fixation who [facility].  Patient just discharged from the hospital and sent right back. Chest x-ray with no acute cardiopulmonary disease process.  MR brain without contrast with no evidence for acute or subacute infarct, large area of encephalomalacia involving the left cerebral hemisphere, limited due to inability to tolerate full length of the study. MD suspects seizure. No prior ST notes.  ?  ?Assessment / Plan / Recommendation  ?Clinical Impression ? Per RN pt is more alert than this morning. She intermittently responded verbally and followed directions with therapist however only opened mouth for oral-motor exam with delays. Audible swallows of thin initially and no indications of reduced airway protection with straw sips thin, puree, puree with whole pill (with RN) or graham cracker. Oral manipulation was delayed therefore, recommend Dys 2 texture, thin liquids, straws allowed, full  assist with meals when adequately alert. ST will continue to see and upgrade when appropriate. ?SLP Visit Diagnosis: Dysphagia, unspecified (R13.10) ?   ?Aspiration Risk ? Mild aspiration risk  ?  ?Diet Recommendation Dysphagia 2 (Fine chop);Thin liquid  ? ?Liquid Administration via: Straw;Cup ?Medication  Administration: Whole meds with puree ?Supervision: Staff to assist with self feeding;Full supervision/cueing for compensatory strategies ?Compensations: Minimize environmental distractions;Slow rate;Small sips/bites;Lingual sweep for clearance of pocketing ?Postural Changes: Seated upright at 90 degrees  ?  ?Other  Recommendations Oral Care Recommendations: Oral care BID   ? ?Recommendations for follow up therapy are one component of a multi-disciplinary discharge planning process, led by the attending physician.  Recommendations may be updated based on patient status, additional functional criteria and insurance authorization. ? ?Follow up Recommendations Skilled nursing-short term rehab (<3 hours/day)  ? ? ?  ?Assistance Recommended at Discharge Frequent or constant Supervision/Assistance  ?Functional Status Assessment Patient has had a recent decline in their functional status and demonstrates the ability to make significant improvements in function in a reasonable and predictable amount of time.  ?Frequency and Duration min 2x/week  ?2 weeks ?  ?   ? ?Prognosis Prognosis for Safe Diet Advancement: Good ?Barriers to Reach Goals: Cognitive deficits  ? ?  ? ?Swallow Study   ?General Date of Onset: 07/12/21 ?HPI: [name] is a 66 year old female with past medical history significant for cerebral AVMs, history of CVA x5 with resulting right-sided hemiparesis, seizure, CKD stage IIIb, urolithiasis, GERD, HLD and recent right femur fracture s/p intramedullary fixation who [facility].  Patient just discharged from the hospital and sent right back. Chest x-ray with no acute cardiopulmonary disease process.  MR brain without contrast with no evidence for acute or subacute infarct, large area of encephalomalacia involving the left cerebral hemisphere, limited due to inability to tolerate full length of the study. MD suspects seizure. No prior ST notes. ?Type  of Study: Bedside Swallow Evaluation ?Previous Swallow Assessment:  (no) ?Diet Prior to this Study: NPO ?Temperature Spikes Noted: No ?Respiratory Status: Room air ?History of Recent Intubation: No ?Behavior/Cognition: Other (Comment);Pleasant mood;Cooperative;Requires cueing (awake) ?Oral Cavity Assessment:  (diff to fully view) ?Oral Care Completed by SLP: No ?Oral Cavity - Dentition:  (will assess) ?Vision:  (question) ?Self-Feeding Abilities: Needs assist ?Patient Positioning: Upright in bed ?Baseline Vocal Quality: Low vocal intensity ?Volitional Cough: Cognitively unable to elicit ?Volitional Swallow: Unable to elicit  ?  ?Oral/Motor/Sensory Function Overall Oral Motor/Sensory Function:  (only opened mouth for therapist)   ?Ice Chips Ice chips: Not tested   ?Thin Liquid Thin Liquid: Within functional limits ?Presentation: Cup;Straw  ?  ?Nectar Thick Nectar Thick Liquid: Not tested   ?Honey Thick Honey Thick Liquid: Not tested   ?Puree Puree: Within functional limits   ?Solid ? ? ?  Solid: Impaired ?Oral Phase Functional Implications: Prolonged oral transit  ? ?  ? ?Houston Siren ?07/13/2021,3:39 PM ? ? ? ?

## 2021-07-14 DIAGNOSIS — S72141A Displaced intertrochanteric fracture of right femur, initial encounter for closed fracture: Secondary | ICD-10-CM | POA: Diagnosis not present

## 2021-07-14 DIAGNOSIS — N390 Urinary tract infection, site not specified: Secondary | ICD-10-CM | POA: Diagnosis present

## 2021-07-14 DIAGNOSIS — G40909 Epilepsy, unspecified, not intractable, without status epilepticus: Secondary | ICD-10-CM

## 2021-07-14 DIAGNOSIS — G9341 Metabolic encephalopathy: Secondary | ICD-10-CM | POA: Diagnosis not present

## 2021-07-14 LAB — BASIC METABOLIC PANEL
Anion gap: 5 (ref 5–15)
BUN: 22 mg/dL (ref 8–23)
CO2: 16 mmol/L — ABNORMAL LOW (ref 22–32)
Calcium: 8.3 mg/dL — ABNORMAL LOW (ref 8.9–10.3)
Chloride: 113 mmol/L — ABNORMAL HIGH (ref 98–111)
Creatinine, Ser: 0.89 mg/dL (ref 0.44–1.00)
GFR, Estimated: >60 mL/min (ref >60)
Glucose, Bld: 119 mg/dL — ABNORMAL HIGH (ref 70–99)
Potassium: 3.3 mmol/L — ABNORMAL LOW (ref 3.5–5.1)
Sodium: 134 mmol/L — ABNORMAL LOW (ref 135–145)

## 2021-07-14 LAB — CBC
HCT: 31.4 % — ABNORMAL LOW (ref 36.0–46.0)
Hemoglobin: 11.3 g/dL — ABNORMAL LOW (ref 12.0–15.0)
MCH: 36.1 pg — ABNORMAL HIGH (ref 26.0–34.0)
MCHC: 36 g/dL (ref 30.0–36.0)
MCV: 100.3 fL — ABNORMAL HIGH (ref 80.0–100.0)
Platelets: 124 10*3/uL — ABNORMAL LOW (ref 150–400)
RBC: 3.13 MIL/uL — ABNORMAL LOW (ref 3.87–5.11)
RDW: 12.6 % (ref 11.5–15.5)
WBC: 11.8 10*3/uL — ABNORMAL HIGH (ref 4.0–10.5)
nRBC: 0 % (ref 0.0–0.2)

## 2021-07-14 LAB — MAGNESIUM: Magnesium: 1.7 mg/dL (ref 1.7–2.4)

## 2021-07-14 LAB — AMMONIA: Ammonia: 46 umol/L — ABNORMAL HIGH (ref 9–35)

## 2021-07-14 MED ORDER — POTASSIUM CHLORIDE CRYS ER 20 MEQ PO TBCR
30.0000 meq | EXTENDED_RELEASE_TABLET | ORAL | Status: AC
  Administered 2021-07-14 (×2): 30 meq via ORAL
  Filled 2021-07-14 (×2): qty 1

## 2021-07-14 MED ORDER — SODIUM CHLORIDE 0.9 % IV SOLN
1.0000 g | INTRAVENOUS | Status: DC
  Administered 2021-07-14 – 2021-07-16 (×3): 1 g via INTRAVENOUS
  Filled 2021-07-14 (×3): qty 10

## 2021-07-14 MED ORDER — MAGNESIUM SULFATE 2 GM/50ML IV SOLN
2.0000 g | Freq: Once | INTRAVENOUS | Status: AC
  Administered 2021-07-14: 2 g via INTRAVENOUS
  Filled 2021-07-14: qty 50

## 2021-07-14 NOTE — Progress Notes (Signed)
NEUROLOGY CONSULTATION PROGRESS NOTE  ? ?Date of service: July 14, 2021 ?Patient Name: Tonya Thompson ?MRN:  [id number] ?DOB:  1955-08-28 ? 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  She was recently discharged after a femoral fracture but was sent back to the hospital by SNF due to altered mental status.  Per report of the patient's relative, the patient is able to ambulate independently and is able to engage in conversation with some verbal deficits at baseline.  She is normally alert and oriented. The relative denies recurrent encephalopathic episodes and states the patient has been seizure-free since around the year 2000. ?  ?Interval Hx  ? ?On assessment this morning, the patient's mental status appears slightly better.  She is responsive to verbal stimulus and remains alert during the interaction.  She is able to follow some commands.  When asked orientation questions, she repeats "2023" over and over again.  She is unable to engage in tasks that involve significant amounts of attention. ? ?Vitals  ? ?Vitals:  ? 07/13/21 2344 07/14/21 0452 07/14/21 8185 07/14/21 0733  ?BP: 124/84 121/78 109/78 125/90  ?Pulse: (!) 104 (!) 115 (!) 109 (!) 111  ?Resp: '19  20 20  '$ ?Temp: (!) 97.5 ?F (36.4 ?C) 98.9 ?F (37.2 ?C) 98.3 ?F (36.8 ?C) 98.5 ?F (36.9 ?C)  ?TempSrc: Oral Oral Oral Oral  ?SpO2: 100% 97% 98% 97%  ?Weight:      ?Height:      ?  ? ?Body mass index is 24.4 kg/m?. ? ?Physical Exam  ? ?General: Sick appearing elderly female, lying in bed, arouses to voice ?HENT: Normal oropharynx and mucosa. Normal external appearance of ears and nose.  ?Neck: Supple, no pain or tenderness  ?CV: No peripheral edema.  ?Pulmonary: Symmetric Chest rise. Normal respiratory effort.  ?Ext: No cyanosis, edema, or deformity  ?Skin: No rash. Normal  palpation of skin.   ?  ?  ?Neurologic Examination  ?Mental status/Cognition: Alert, unable to answer questions in a logical manner, able to follow some commands such as "lift your arm" ?Cranial nerves:  ? CN II Pupils equal and reactive to light  ? CN III,IV,VI Able to look towards examiner  ? CN V untested  ? CN VII no asymmetry, no nasolabial fold flattening   ? CN VIII normal hearing to speech   ? CN IX & X untested  ? CN XI untested  ? CN XII untested  ?  ? ? ?Labs  ? ?Basic Metabolic Panel:  ?Lab Results  ?Component Value Date  ? NA 134 (L) 07/14/2021  ? K 3.3 (L) 07/14/2021  ? CO2 16 (L) 07/14/2021  ? GLUCOSE 119 (H) 07/14/2021  ? BUN 22 07/14/2021  ? CREATININE 0.89 07/14/2021  ? CALCIUM 8.3 (L) 07/14/2021  ? GFRNONAA >60 07/14/2021  ? GFRAA 47 (L) 06/26/2020  ? ?HbA1c:  ?Lab Results  ?Component Value Date  ? HGBA1C 4.8 12/19/2020  ? ?LDL:  ?Lab Results  ?Component Value Date  ? [place] 85 07/09/2021  ? ?Urine Drug Screen: No results found for: LABOPIA, COCAINSCRNUR, [place], [place], THCU, LABBARB  ?Alcohol Level  ?   ?Component Value Date/Time  ? ETH <10 07/12/2021 1554  ? ?No results found for: PHENYTOIN, ZONISAMIDE, LAMOTRIGINE, LEVETIRACETA ?Lab Results  ?Component Value Date  ? VALPROATE 94 07/12/2021  ? ? ?  Imaging and Diagnostic studies  ? ?CT-H ?IMPRESSION: ?1. No acute intracranial pathology. ?2. 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 ?  ?MR BRAIN WO CONTRAST ?IMPRESSION: ?1. Technically limited exam due to the patient's inability to ?tolerate the full length of the study. Axial and coronal DWI ?sequences only were performed. ?2. No evidence for acute or subacute infarct. ?3. Large area of encephalomalacia involving the left cerebral ?hemisphere with associated ex vacuo dilatation of the left lateral ?ventricle, stable. ? ? ?Impression  ? ?Tonya Thompson is a 66 year old female with a past medical history of  cerebral AVMs, 5 previous CVAs with resulting right-sided hemiparesis, history of seizure (the last episode documented is in 1998), and CKD 3B.  She was recently discharged after a femoral fracture but was sent back to the hospital by SNF due to altered mental status.  Per report of the patient's relative, the patient is able to ambulate independently and is able to engage in conversation with some verbal deficits at baseline.  She is normally alert and oriented. The relative denies recurrent encephalopathic episodes and states the patient has been seizure-free since around the year 2000. ? 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  The patient's ammonia is down to 46 this morning, expect to see improvement in her mental status over the next several days if this is the etiology.  Routine EEG was negative for seizure, will follow-up results of overnight EEG. ? ? ?Recommendations  ?-F/u continuous EEG results ?-Continue to hold Depakote ?-Continue Topamax 300 mg BID ?-Continue Keppra 500 mg q12hrs ? ?Corky Sox, MD ?PGY-1 ? ?I have seen the patient and reviewed the note of Dr. Valarie Merino.  On my assessment, she is much more awake and interactive than previously.  She still has some perseveration in her answers, but she is able to answer some questions with some degree of fluency.  No seizures on overnight EEG, and therefore I think we can discontinue EEG at this time.  I would favor continuing [place] for now, but once her mental status is improved, she may not need it over the long-term given that she was well controlled on Topamax previously. ? ?I continue to think that there may have been some degree of Depakote associated hyperammonemic encephalopathy playing a role here, and therefore I would not reinstitute Depakote therapy. ? ?She likely has some degree of recrudescence of her previous symptoms due to acute physiological stress as well,  which may be contributing also. ? ?Neurology will continue to follow. ? ?Roland Rack, MD ?[facility] ?[phone] ? ?If 7pm- 7am, please page neurology on call as listed in [place]. ? ? ?

## 2021-07-14 NOTE — Progress Notes (Signed)
?PROGRESS NOTE ? ? ? ?Melanee Cordial Meath  OVZ:[id number] DOB: 02-24-1956 DOA: 07/12/2021 ?PCP: Unk Pinto, MD  ? ? ?Brief Narrative:  ?Tonya Thompson is a 66 year old female with past medical history significant for cerebral AVMs, history of CVA x5 with resulting right-sided hemiparesis, history of seizure, CKD stage IIIb, urolithiasis, GERD, HLD and recent right femur fracture s/p intramedullary fixation who [facility] for [facility] status.  Patient was just discharged from the hospital following admission for hip fracture.  After arriving at the facility, staff noted that she seemed more confused than her reported baseline; and was immediately sent back to the ED for further evaluation. ? ?In the ED, temperature 97.5 ?F, HR 103, RR 18, BP 124/93, SPO2 98% on room air. WBC 11.3, hemoglobin 9.9, platelets 95.  Sodium 133, potassium 2.6, chloride 109, CO2 18, glucose 93, BUN 31, creatinine 0.92.  AST 31, ALT 10.  Total bilirubin 0.8.  Urinalysis with large leukocytes, negative nitrite, few bacteria, 11-20 WBCs.  EtOH level less than 10.  CT head without contrast with no acute intracranial pathology, extensive encephalomalacia of the left cerebral hemisphere with ex vacuo dilation of the left lateral ventricle.  Chest x-ray with no acute cardiopulmonary disease process.  MR brain without contrast with no evidence for acute or subacute infarct, large area of encephalomalacia involving the left cerebral hemisphere, limited due to inability to tolerate full length of the study.  EDP discussed with on-call neurologist who recommended transfer to [facility] for EEG for concern of seizure.  Hospital service was consulted and patient was transferred to [facility] for further evaluation and management. ?  ?  ?Assessment and Plan: ?* Acute metabolic encephalopathy ?Patient presenting to the ED from SNF with altered mental status, confusion after recent hospitalization  for hip fracture.  Patient with history of multiple CVAs, seizure disorder, AVMs of the cerebrum.  CT head and MRI brain with no acute intracranial findings.  Chest x-ray negative for acute cardiopulmonary disease process.  Urinalysis unrevealing.  High suspicion for possible recurrent seizure.  Also consideration for elevated ammonia level during previous hospitalization, was 76 on 07/10/2021. ?--Supportive care; EEG as below; repeat ammonia level in a.m. ?--SLP following, on dysphagia 2 diet ?--Urine culture pending ? ?Seizure disorder ([place]) ?Apparently patient has been seizure-free since around 2000.  EEG with evidence of a left Ogen density arising from the left central-temporal region with cortical dysfunction left temporal region likely secondary to underlying encephalomalacia, moderate diffuse encephalopathy that is nonspecific and no seizures were noted during recording. ?--Neurology following, appreciate assistance ?--Depakote discontinued due to elevated ammonia level ?--Topamax 300 mg PO BID ?--Keppra 500 mg BID ? ?Right hemiparesis ([place]) ?Continues with resultant right-sided hemiparesis from multiple CVAs in the past.  [facility] ?--PT/OT recommends SNF, continue therapy efforts while inpatient ? ?Closed intertrochanteric fracture of right femur ([place]) ?Patient with recent right intratrochanteric hip fracture.  Underwent IM nail by Dr. Lyla Glassing, on 07/09/2021. ?--Weightbearing as tolerates with walker ?--Lovenox for DVT prophylaxis ?--Outpatient follow-up with orthopedics ? ?Hyponatremia ?Patient presenting with a sodium of 131, likely secondary to prerenal azotemia/dehydration.  Received IV fluid hydration with improvement. ? ?Hypokalemia ?Potassium 2.6 on admission.  Repleted.  Potassium is at 3.3 this morning, will continue to replete. ?--Repeat electrolytes in the a.m. to include magnesium ? ?Hyperammonemia ([place]) ?Ammonia level was noted to be elevated on 07/10/2021, 76.   Neurology discontinued Depakote as  this could be playing a factor in its elevation. ?--Repeat ammonia decreased to 46 today ? ?Hypomagnesemia ?Magnesium 1.7 this morning, will replete. ?--Repeat magnesium level in a.m. ? ?UTI (urinary tract infection) ?Urinalysis with large leukocytes, negative nitrite, few bacteria, 11-20 WBCs.  Given her encephalopathy, will cover for antibiotics as she is unable to describe any urinary symptoms. ?--Urine culture: Pending ?--Ceftriaxone 1 g IV every 24 hours ? ? ? ? ? ?DVT prophylaxis: enoxaparin (LOVENOX) injection 40 mg Start: 07/14/21 1000 ? ?  Code Status: Full Code ?Family Communication: No family present at bedside this morning ? ?Disposition Plan:  ?Level of care: Telemetry Medical ?Status is: Inpatient ?Remains inpatient appropriate because: Awaiting further recommendations per neurology, anticipate discharge back to [facility] in 1-2 days ?  ? ?Consultants:  ?Neurology ? ?Procedures:  ?EEG ? ?Antimicrobials:  ?None ? ? ?Subjective: ?Patient seen examined at bedside, resting comfortably.  Remains on continuous EEG.  Alert but continues with confusion.  Answering all questions with "2023".  No family present at bedside.  Denies headache, no chest pain, no shortness of breath, no abdominal pain.  No acute events overnight per nursing staff.   ? ?Objective: ?Vitals:  ? 07/13/21 2344 07/14/21 0452 07/14/21 4098 07/14/21 0733  ?BP: 124/84 121/78 109/78 125/90  ?Pulse: (!) 104 (!) 115 (!) 109 (!) 111  ?Resp: '19  20 20  '$ ?Temp: (!) 97.5 ?F (36.4 ?C) 98.9 ?F (37.2 ?C) 98.3 ?F (36.8 ?C) 98.5 ?F (36.9 ?C)  ?TempSrc: Oral Oral Oral Oral  ?SpO2: 100% 97% 98% 97%  ?Weight:      ?Height:      ? ? ?Intake/Output Summary (Last 24 hours) at 07/14/2021 1135 ?Last data filed at 07/14/2021 1191 ?Gross per 24 hour  ?Intake 1574.24 ml  ?Output 1250 ml  ?Net 324.24 ml  ? ?Filed Weights  ? 07/12/21 1413 07/12/21 2142  ?Weight: 65 kg 66.5 kg  ? ? ?Examination: ? ?Physical Exam: ?GEN: NAD, alert  and oriented to time (2023), but not place, person or situation; chronically ill in appearance ?HEENT: NCAT, PERRL, EOMI, sclera clear, dry mucous membranes ?PULM: CTAB w/o wheezes/crackles, normal respiratory effort, on room air ?CV: RRR w/o M/G/R ?GI: abd soft, NTND, NABS, no R/G/M ?MSK: no peripheral edema, muscle strength globally intact 5/5 bilateral upper/lower extremities ?NEURO: Right-sided hemiparesis with muscle strength 4/5 LLE, 3/5 RLE ?PSYCH: normal mood/affect ?Integumentary: dry/intact, no rashes or wounds ? ? ? ?Data Reviewed: I have personally reviewed following labs and imaging studies ? ?CBC: ?Recent Labs  ?Lab 07/08/21 ?1052 07/09/21 ?0456 07/11/21 ?0600 07/12/21 ?4782 07/12/21 ?1554 07/13/21 ?0304 07/14/21 ?0301  ?WBC 6.5   < > 17.4* 16.1* 11.3* 13.8* 11.8*  ?NEUTROABS 5.0  --   --   --  9.1* 10.9*  --   ?HGB 15.3*   < > 12.3 12.8 9.9* 12.7 11.3*  ?HCT 45.3   < > 34.8* 35.5* 29.6* 37.2 31.4*  ?MCV 106.3*   < > 103.0* 100.0 106.9* 105.7* 100.3*  ?PLT 93*   < > 73* 98* 95* 128* 124*  ? < > = values in this interval not displayed.  ? ?Basic Metabolic Panel: ?Recent Labs  ?Lab 07/08/21 ?1052 07/09/21 ?0456 07/10/21 ?0458 07/11/21 ?0600 07/12/21 ?1554 07/12/21 ?2320 07/13/21 ?0304 07/14/21 ?0301  ?NA 139   < > 136 135 133*  --  137 134*  ?K 3.9   < > 3.9 4.3 2.6*  --  3.1* 3.3*  ?CL 106   < >  109 109 109  --  113* 113*  ?CO2 27   < > 17* 19* 18*  --  16* 16*  ?GLUCOSE 99   < > 134* 80 93  --  102* 119*  ?BUN 26*   < > 39* 38* 31*  --  26* 22  ?CREATININE 1.32*   < > 1.42* 1.10* 0.92  --  0.83 0.89  ?CALCIUM 9.4   < > 8.3* 8.2* 7.9*  --  8.5* 8.3*  ?MG 2.4  --   --  2.0  --  1.9  --  1.7  ?PHOS 4.2  --   --   --   --  1.9*  --   --   ? < > = values in this interval not displayed.  ? ?GFR: ?Estimated Creatinine Clearance: 58.2 mL/min (by C-G formula based on SCr of 0.89 mg/dL). ?Liver Function Tests: ?Recent Labs  ?Lab 07/08/21 ?1052 07/09/21 ?0456 07/12/21 ?1554  ?AST 57* 41 31  ?ALT 34 28 10   ?ALKPHOS 80 63 69  ?BILITOT 0.7 0.6 0.8  ?PROT 6.5 5.6* 4.9*  ?ALBUMIN 3.3* 2.8* 2.1*  ? ?No results for input(s): LIPASE, AMYLASE in the last 168 hours. ?Recent Labs  ?Lab 07/10/21 ?1828 07/14/21 ?0301  ?AMMONIA

## 2021-07-14 NOTE — Procedures (Addendum)
Patient Name: Tonya Thompson  ?MRN: [id number]  ?Epilepsy Attending: Lora Havens  ?Referring Physician/Provider: Greta Doom, MD ?Duration: 07/13/2021 1253 to 07/14/2021 1109 ?  ?Patient history: 66 y.o. female PMHx as noted above, seizure on VPA and TPM recently discharged noted to be more encephalopathic and readmitted for possible seizure and was loaded with Keppra. EEG to evaluate for seizure. ?  ?Level of alertness: Awake, asleep ?  ?AEDs during EEG study: TPM, LEV ?  ?Technical aspects: This EEG study was done with scalp electrodes positioned according to the 10-20 International system of electrode placement. Electrical activity was acquired at a sampling rate of '500Hz'$  and reviewed with a high frequency filter of '70Hz'$  and a low frequency filter of '1Hz'$ . EEG data were recorded continuously and digitally stored.  ?  ?Description: EEG showed continuous generalized and maximal left temporal region 3 to 6 Hz theta-delta slowing. Sleep was characterized by vertex waves, sleep spindles (12 to 14 Hz), maximal frontocentral region.  Spikes were noted in left centro- temporal region. Hyperventilation and photic stimulation were not performed.    ?  ?ABNORMALITY ?- Spike, left centro- temporal region ?- Continuous slow, generalized and maximal left temporal region ?  ?IMPRESSION: ?This study showed evidence of epileptogenicity arising from left centro- temporal region. There is also cortical dysfunction arising from left temporal region likely secondary to underlying encephalomalacia. Additionally there is moderate diffuse encephalopathy, nonspecific etiology. No seizures were seen throughout the recording. ?  ?Lora Havens  ?

## 2021-07-14 NOTE — Progress Notes (Signed)
EEG maintenance performed.  No skin breakdown observed at electrode sites Fp1, Fp2. 

## 2021-07-14 NOTE — Progress Notes (Signed)
Speech Language Pathology Treatment: Dysphagia  ?Patient Details ?Name: Tonya Thompson ?MRN: [id number] ?DOB: 1955-07-09 ?Today's Date: 07/14/2021 ?Time: 1751-0258 ?SLP Time Calculation (min) (ACUTE ONLY): 12 min ? ?Assessment / Plan / Recommendation ?Clinical Impression ? Pt was given PO trials of solids broken into small, bite-sized pieces and thin liquids via straw with no overt s/s of aspiration noted. At times she has a second swallow with thin liquids, appearing to be piecemeal swallowing as she still looks like she's holding thin liquids in her mouth. Oral preparation with solids is functional although does start to slow even after only a few trials. Question if this could be related to attention. She needs cues for attention and has delayed responses throughout session, not always responding to questions. Recommend continuing Dys 2 diet and thin liquids with potential to advance particularly with improvements in mentation. ?  ?HPI HPI: Tonya Thompson is a 66 year old female with past medical history significant for cerebral AVMs, history of CVA x5 with resulting right-sided hemiparesis, seizure, CKD stage IIIb, urolithiasis, GERD, HLD and recent right femur fracture s/p intramedullary fixation who [facility] status.  Patient just discharged from the hospital and sent right back. Chest x-ray with no acute cardiopulmonary disease process.  MR brain without contrast with no evidence for acute or subacute infarct, large area of encephalomalacia involving the left cerebral hemisphere, limited due to inability to tolerate full length of the study. MD suspects seizure. No prior ST notes. ?  ?   ?SLP Plan ? Continue with current plan of care ? ?  ?  ?Recommendations for follow up therapy are one component of a multi-disciplinary discharge planning process, led by the attending physician.  Recommendations may be updated based on patient status, additional functional  criteria and insurance authorization. ?  ? ?Recommendations  ?Diet recommendations: Dysphagia 2 (fine chop);Thin liquid ?Liquids provided via: Cup;Straw ?Medication Administration: Whole meds with puree ?Supervision: Staff to assist with self feeding ?Compensations: Minimize environmental distractions;Slow rate;Small sips/bites;Lingual sweep for clearance of pocketing ?Postural Changes and/or Swallow Maneuvers: Seated upright 90 degrees  ?   ?    ?   ? ? ? ? Oral Care Recommendations: Oral care BID ?Follow Up Recommendations: Skilled nursing-short term rehab (<3 hours/day) ?Assistance recommended at discharge: Frequent or constant Supervision/Assistance ?SLP Visit Diagnosis: Dysphagia, unspecified (R13.10) ?Plan: Continue with current plan of care ? ? ? ? ?  ?  ? ? ?Osie Bond., M.A. CCC-SLP ?Acute Rehabilitation Services ?Pager [phone] ?Office [phone] ? ? ?07/14/2021, 10:37 AM ?

## 2021-07-14 NOTE — Progress Notes (Signed)
Physical Therapy Treatment ?Patient Details ?Name: Tonya Thompson ?MRN: [id number] ?DOB: 23-Sep-1955 ?Today's Date: 07/14/2021 ? ? ?History of Present Illness Patient is a 66 y/o female who presents on 07/12/21 from [facility] with AMS. Found to have acute metabolic encephalopathy, concern for seizures. D/ced to rehab on 07/12/21 and readmited to hospital same night. PMH includes CVA x6 with right sided weakness, AVM of brain, seizures, recent femur fx of RLE s/p IM nail 07/09/21. ? ?  ?PT Comments  ? ? Pt was fairly lethargic when PT arrived, and was able to open eyes a bit to work on LE ROM and strengthening.  Has discomfort on R hip only with partial assisted flexion of the leg.  Pt was minimally interactive, but as PT was finishing session and family arrived, became awake and spoke very understandably to them.  Per the sister afterward, the exchange was good but brief.  Follow up for work on transfers as pt's alertness permits.   ?Recommendations for follow up therapy are one component of a multi-disciplinary discharge planning process, led by the attending physician.  Recommendations may be updated based on patient status, additional functional criteria and insurance authorization. ? ?Follow Up Recommendations ? Skilled nursing-short term rehab (<3 hours/day) ?  ?  ?Assistance Recommended at Discharge Frequent or constant Supervision/Assistance  ?Patient can return home with the following Two people to help with walking and/or transfers;Two people to help with bathing/dressing/bathroom;Direct supervision/assist for medications management;Help with stairs or ramp for entrance;Assist for transportation;Assistance with feeding;Assistance with cooking/housework;Direct supervision/assist for financial management ?  ?Equipment Recommendations ? None recommended by PT  ?  ?Recommendations for Other Services   ? ? ?  ?Precautions / Restrictions Precautions ?Precautions: Fall ?Precaution Comments: Rt hemiplegia 2*  multiple CVAs, R shoulder sublux, hx of seizures, ?Restrictions ?Weight Bearing Restrictions: Yes ?RLE Weight Bearing: Weight bearing as tolerated  ?  ? ?Mobility ? Bed Mobility ?Overal bed mobility: Needs Assistance ?  ?  ?  ?  ?  ?  ?General bed mobility comments: repositioned legs on the bed ?  ? ?Transfers ?  ?  ?  ?  ?  ?  ?  ?  ?  ?  ?  ? ?Ambulation/Gait ?  ?  ?  ?  ?  ?  ?  ?  ? ? ?Stairs ?  ?  ?  ?  ?  ? ? ?Wheelchair Mobility ?  ? ?Modified Rankin (Stroke Patients Only) ?Modified Rankin (Stroke Patients Only) ?Pre-Morbid Rankin Score: Severe disability ?Modified Rankin: Severe disability ? ? ?  ?Balance   ?  ?  ?  ?  ?  ?  ?  ?  ?  ?  ?  ?  ?  ?  ?  ?  ?  ?  ?  ? ?  ?Cognition Arousal/Alertness: Lethargic ?Behavior During Therapy: Flat affect ?Overall Cognitive Status: No family/caregiver present to determine baseline cognitive functioning ?  ?  ?  ?  ?  ?  ?  ?  ?  ?  ?  ?  ?  ?  ?  ?  ?General Comments: pt is not speaking directly to PT but trying to somewhat follow directions ?  ?  ? ?  ?Exercises General Exercises - Lower Extremity ?Ankle Circles/Pumps: AAROM, 5 reps ?Gluteal Sets: AAROM, 10 reps ?Heel Slides: AAROM, 10 reps ?Hip ABduction/ADduction: AAROM, 10 reps ? ?  ?General Comments General comments (skin integrity, edema, etc.): pt in bed with  eyes closed, opened them at times to interact nonverbally with PT ?  ?  ? ?Pertinent Vitals/Pain Pain Assessment ?Pain Assessment: Faces ?Faces Pain Scale: Hurts little more ?Pain Location: R hip with movement ?Pain Descriptors / Indicators: Guarding, Grimacing ?Pain Intervention(s): Limited activity within patient's tolerance, Monitored during session, Repositioned  ? ? ?Home Living   ?  ?  ?  ?  ?  ?  ?  ?  ?  ?   ?  ?Prior Function    ?  ?  ?   ? ?PT Goals (current goals can now be found in the care plan section) Acute Rehab PT Goals ?Patient Stated Goal: none stated ? ?  ?Frequency ? ? ? Min 3X/week ? ? ? ?  ?PT Plan Current plan remains appropriate   ? ? ?Co-evaluation   ?  ?  ?  ?  ? ?  ?AM-PAC PT "6 Clicks" Mobility   ?Outcome Measure ? Help needed turning from your back to your side while in a flat bed without using bedrails?: A Lot ?Help needed moving from lying on your back to sitting on the side of a flat bed without using bedrails?: A Lot ?Help needed moving to and from a bed to a chair (including a wheelchair)?: Total ?Help needed standing up from a chair using your arms (e.g., wheelchair or bedside chair)?: Total ?Help needed to walk in hospital room?: Total ?Help needed climbing 3-5 steps with a railing? : Total ?6 Click Score: 8 ? ?  ?End of Session   ?Activity Tolerance: Patient limited by lethargy ?Patient left: in bed;with call bell/phone within reach;with bed alarm set ?Nurse Communication: Mobility status ?PT Visit Diagnosis: Other abnormalities of gait and mobility (R26.89);Other symptoms and signs involving the nervous system (R29.898);Difficulty in walking, not elsewhere classified (R26.2);Muscle weakness (generalized) (M62.81);History of falling (Z91.81) ?  ? ? ?Time: 1353-1405 ?PT Time Calculation (min) (ACUTE ONLY): 12 min ? ?Charges:  $Therapeutic Exercise: 8-22 mins    ?Ramond Dial ?07/14/2021, 3:53 PM ? ?Mee Hives, PT PhD ?Acute Rehab Dept. Number: [facility] [phone] and [place] [phone] ? ? ?

## 2021-07-14 NOTE — Assessment & Plan Note (Addendum)
Repleted during hospitalization. °

## 2021-07-14 NOTE — Progress Notes (Signed)
?   07/14/21 0452  ?Assess: MEWS Score  ?Temp 98.9 ?F (37.2 ?C)  ?BP 121/78  ?Pulse Rate (!) 115  ?SpO2 97 %  ?Assess: MEWS Score  ?MEWS Temp 0  ?MEWS Systolic 0  ?MEWS Pulse 2  ?MEWS RR 0  ?MEWS LOC 0  ?MEWS Score 2  ?MEWS Score Color Yellow  ?Assess: if the MEWS score is Yellow or Red  ?Were vital signs taken at a resting state? Yes  ?Focused Assessment No change from prior assessment  ?Does the patient meet 2 or more of the SIRS criteria? No  ?Does the patient have a confirmed or suspected source of infection? No  ?Early Detection of Sepsis Score *See Row Information* Low  ?MEWS guidelines implemented *See Row Information* Yes  ?Treat  ?Pain Scale Faces  ?Faces Pain Scale 0  ?Take Vital Signs  ?Increase Vital Sign Frequency  Yellow: Q 2hr X 2 then Q 4hr X 2, if remains yellow, continue Q 4hrs  ?Escalate  ?MEWS: Escalate Yellow: discuss with charge nurse/RN and consider discussing with provider and RRT  ?Notify: Charge Nurse/RN  ?Date Charge Nurse/RN Notified 07/14/21  ?Time Charge Nurse/RN Notified [phone]  ?Notify: Provider  ?Provider Name/Title Konrad Felix MD  ?Date Provider Notified 07/14/21  ?Time Provider Notified [phone]  ?Notification Type Page  ?Provider response Evaluate remotely;Other (Comment) ?(continue to monitor)  ?Date of Provider Response 07/14/21  ?Time of Provider Response [phone]  ?Document  ?Patient Outcome Other (Comment) ?(continue to monitor per provider)  ?Progress note created (see row info) Yes  ? ? ?

## 2021-07-14 NOTE — Assessment & Plan Note (Addendum)
Urinalysis with large leukocytes, negative nitrite, few bacteria, 11-20 WBCs.  Urine culture positive for E. coli and Enterobacter cloacae .  Continue ciprofloxacin 500 mg p.o. twice daily; 5-day course based on culture susceptibilities. ? ?

## 2021-07-15 DIAGNOSIS — G9341 Metabolic encephalopathy: Secondary | ICD-10-CM | POA: Diagnosis not present

## 2021-07-15 LAB — MAGNESIUM: Magnesium: 2.2 mg/dL (ref 1.7–2.4)

## 2021-07-15 LAB — TSH: TSH: 3.039 u[IU]/mL (ref 0.350–4.500)

## 2021-07-15 LAB — CBC
HCT: 29.9 % — ABNORMAL LOW (ref 36.0–46.0)
Hemoglobin: 10.4 g/dL — ABNORMAL LOW (ref 12.0–15.0)
MCH: 36 pg — ABNORMAL HIGH (ref 26.0–34.0)
MCHC: 34.8 g/dL (ref 30.0–36.0)
MCV: 103.5 fL — ABNORMAL HIGH (ref 80.0–100.0)
Platelets: 129 10*3/uL — ABNORMAL LOW (ref 150–400)
RBC: 2.89 MIL/uL — ABNORMAL LOW (ref 3.87–5.11)
RDW: 13.1 % (ref 11.5–15.5)
WBC: 10.2 10*3/uL (ref 4.0–10.5)
nRBC: 0 % (ref 0.0–0.2)

## 2021-07-15 LAB — BASIC METABOLIC PANEL
Anion gap: 6 (ref 5–15)
BUN: 23 mg/dL (ref 8–23)
CO2: 15 mmol/L — ABNORMAL LOW (ref 22–32)
Calcium: 8 mg/dL — ABNORMAL LOW (ref 8.9–10.3)
Chloride: 112 mmol/L — ABNORMAL HIGH (ref 98–111)
Creatinine, Ser: 0.84 mg/dL (ref 0.44–1.00)
GFR, Estimated: >60 mL/min (ref >60)
Glucose, Bld: 90 mg/dL (ref 70–99)
Potassium: 4 mmol/L (ref 3.5–5.1)
Sodium: 133 mmol/L — ABNORMAL LOW (ref 135–145)

## 2021-07-15 MED ORDER — SODIUM BICARBONATE 650 MG PO TABS
650.0000 mg | ORAL_TABLET | Freq: Two times a day (BID) | ORAL | Status: DC
  Administered 2021-07-15 – 2021-07-17 (×5): 650 mg via ORAL
  Filled 2021-07-15 (×5): qty 1

## 2021-07-15 NOTE — Care Management Important Message (Signed)
Important Message ? ?Patient Details  ?Name: Tonya Thompson ?MRN: [id number] ?Date of Birth: 13-Jan-1956 ? ? ?Medicare Important Message Given:  Yes ? ? ? ? ?Hanne Kegg ?07/15/2021, 1:48 PM ?

## 2021-07-15 NOTE — Progress Notes (Signed)
NEUROLOGY CONSULTATION PROGRESS NOTE  ? ?Date of service: July 15, 2021 ?Patient Name: Tonya Thompson ?MRN:  [id number] ?DOB:  1955/06/09 ? 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  She was recently discharged after a femoral fracture but was sent back to the hospital by SNF due to altered mental status.  Per report of the patient's relative, the patient is able to ambulate independently and is able to engage in conversation with some verbal deficits at baseline.  She is normally alert and oriented. The relative denies recurrent encephalopathic episodes and states the patient has been seizure-free since around the year 2000. ?  ?Interval Hx  ? ?On assessment this morning, the patient is somnolent making improvement in her mental status difficult to ascertain. She is able to answer some orientation questions correctly (oriented to person, place, and time but not events). She is unable to engage in tasks that require prolonged attention. She denies any physical complaints.  ? ?Vitals  ? ?Vitals:  ? 07/14/21 2023 07/14/21 2350 07/15/21 0300 07/15/21 0900  ?BP: 100/73 (!) 105/93 109/71 117/69  ?Pulse: 84 86 88 85  ?Resp: '20 20 20 20  '$ ?Temp: 99.5 ?F (37.5 ?C) 99.4 ?F (37.4 ?C) 99.6 ?F (37.6 ?C) 98.9 ?F (37.2 ?C)  ?TempSrc: Oral Oral Oral Oral  ?SpO2: 98% 99% 100% 100%  ?Weight:      ?Height:      ?  ? ?Body mass index is 24.4 kg/m?. ? ?Physical Exam  ?General: Lying comfortably in bed; in no acute distress.  ?HENT: Normal oropharynx and mucosa. Normal external appearance of ears and nose.  ?Neck: Supple, no pain or tenderness  ?CV: No peripheral edema.  ?Pulmonary: Symmetric Chest rise. Normal respiratory effort.  ?Ext: No cyanosis, edema, or deformity  ?Skin: No rash. Normal palpation of skin.   ?  ?Neurologic Examination  ?Mental  status/Cognition: Alert, oriented as above ?Speech/language: some word finding difficulty, non-spontaneous ?Cranial nerves:  ? CN II Pupils equal and reactive to light, no VF deficits   ? CN III,IV,VI EOM intact, follows examiner but does not track examiner's finger  ? CN V normal sensation in V1, V2, and V3 segments bilaterally   ? CN VII no asymmetry, no nasolabial fold flattening   ? CN VIII normal hearing to speech   ? CN IX & X normal palatal elevation, no uvular deviation   ? CN XI 5/5 shoulder shrug on the left, decreased on the right  ? CN XII midline tongue protrusion   ?  ?Motor:  ?Mvmt Root Nerve  Muscle Right Left Comments  ?SA C5/6 Ax Deltoid 0/5 4/5    ?EF C5/6 Mc Biceps 0/5 4/5    ?EE C6/7/8 Rad Triceps 0/5 4/5    ?WF C6/7 Med FCR        ?WE C7/8 PIN ECU        ?F Ab C8/T1 U ADM/FDI        ?HF L1/2/3 Fem Iliopsoas 1/5 2/5    ?KE L2/3/4 Fem Quad 1/5 2/5    ?DF L4/5 D Peron Tib Ant        ?PF S1/2 Tibial Grc/Sol        ?  ?Reflexes: ?  Right Left Comments  ?Pectoralis        ? Biceps (C5/6)      ?  Brachioradialis (C5/6)      ? Triceps (C6/7)      ? Patellar (L3/4)      ? Achilles (S1)      ? Hoffman        ? Plantar        ?Jaw jerk     ?  ?Sensation: ? Light touch Diminished in RUE and RLE  ? Pin prick    ? Temperature    ? Vibration    ?Proprioception    ?  ?Coordination/Complex Motor:  ?- Finger to Nose: unable to follow instructions ?- Gait: deferred ? ? ? ?Labs  ? ?Basic Metabolic Panel:  ?Lab Results  ?Component Value Date  ? NA 133 (L) 07/15/2021  ? K 4.0 07/15/2021  ? CO2 15 (L) 07/15/2021  ? GLUCOSE 90 07/15/2021  ? BUN 23 07/15/2021  ? CREATININE 0.84 07/15/2021  ? CALCIUM 8.0 (L) 07/15/2021  ? GFRNONAA >60 07/15/2021  ? GFRAA 47 (L) 06/26/2020  ? ?HbA1c:  ?Lab Results  ?Component Value Date  ? HGBA1C 4.8 12/19/2020  ? ?LDL:  ?Lab Results  ?Component Value Date  ? [place] 85 07/09/2021  ? ?Urine Drug Screen: No results found for: LABOPIA, COCAINSCRNUR, [place], [place], THCU, LABBARB  ?Alcohol  Level  ?   ?Component Value Date/Time  ? ETH <10 07/12/2021 1554  ? ?No results found for: PHENYTOIN, ZONISAMIDE, LAMOTRIGINE, LEVETIRACETA ?Lab Results  ?Component Value Date  ? VALPROATE 94 07/12/2021  ? ? ?Imaging and Diagnostic studies  ? ?CT-H ?IMPRESSION: ?1. No acute intracranial pathology. ?2. 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 ?  ?MR BRAIN WO CONTRAST ?IMPRESSION: ?1. Technically limited exam due to the patient's inability to ?tolerate the full length of the study. Axial and coronal DWI ?sequences only were performed. ?2. No evidence for acute or subacute infarct. ?3. Large area of encephalomalacia involving the left cerebral ?hemisphere with associated ex vacuo dilatation of the left lateral ?ventricle, stable. ? ? ?Impression  ? 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  She was recently discharged after a femoral fracture but was sent back to the hospital by SNF due to altered mental status.  Per report of the patient's relative, the patient is able to ambulate independently and is able to engage in conversation with some verbal deficits at baseline.  She is normally alert and oriented. The relative denies recurrent encephalopathic episodes and states the patient has been seizure-free since around the year 2000. ? 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 Expect to see continued improvement. ? ?Recommendations  ? ?-Home Depakote discontinued ?-Continue home Topamax 300 mg BID ?-Discontinue Keppra 500 mg q12hrs  ? ? ?Corky Sox, MD ?PGY-1 ? ?I have seen the patient and reviewed the above note.  Her mental status is improving on a  day-to-day basis.  I am concerned that some of her waxing/waning may be due to drowsiness which could be contributed to by Keppra and therefore I will discontinue this. ? ?Neurology will continue to follow. ? ?Roland Rack, MD ?[facility] ?[phone] ? ?If 7pm- 7am, please page neurology on call as listed in [place]. ? ? ?

## 2021-07-15 NOTE — Progress Notes (Signed)
?PROGRESS NOTE ? ? ? ?[name]  BZJ:[id number] DOB: Mar 30, 1956 DOA: 07/12/2021 ?PCP: Unk Pinto, MD  ? ? ?Brief Narrative:  ?Tonya Thompson is a 66 year old female with past medical history significant for cerebral AVMs, history of CVA x5 with resulting right-sided hemiparesis, history of seizure, CKD stage IIIb, urolithiasis, GERD, HLD and recent right femur fracture s/p intramedullary fixation who [facility] for altered mental status.  Patient was just discharged from the hospital following admission for hip fracture.  After arriving at the facility, staff noted that she seemed more confused than her reported baseline; and was immediately sent back to the ED for further evaluation. ? ?In the ED, temperature 97.5 ?F, HR 103, RR 18, BP 124/93, SPO2 98% on room air. WBC 11.3, hemoglobin 9.9, platelets 95.  Sodium 133, potassium 2.6, chloride 109, CO2 18, glucose 93, BUN 31, creatinine 0.92.  AST 31, ALT 10.  Total bilirubin 0.8.  Urinalysis with large leukocytes, negative nitrite, few bacteria, 11-20 WBCs.  EtOH level less than 10.  CT head without contrast with no acute intracranial pathology, extensive encephalomalacia of the left cerebral hemisphere with ex vacuo dilation of the left lateral ventricle.  Chest x-ray with no acute cardiopulmonary disease process.  MR brain without contrast with no evidence for acute or subacute infarct, large area of encephalomalacia involving the left cerebral hemisphere, limited due to inability to tolerate full length of the study.  EDP discussed with on-call neurologist who recommended transfer to [facility] for EEG for concern of seizure.  Hospital service was consulted and patient was transferred to [facility] for further evaluation and management. ?  ?  ?Assessment and Plan: ?* Acute metabolic encephalopathy ?Patient presenting to the ED from SNF with altered mental status, confusion after recent hospitalization  for hip fracture.  Patient with history of multiple CVAs, seizure disorder, AVMs of the cerebrum.  CT head and MRI brain with no acute intracranial findings.  Chest x-ray negative for acute cardiopulmonary disease process.  Urinalysis unrevealing.  High suspicion for possible recurrent seizure.  Also consideration for elevated ammonia level during previous hospitalization, was 76 on 07/10/2021. ?--SLP following, on dysphagia 2 diet ? ?Seizure disorder ([place]) ?Apparently patient has been seizure-free since around 2000.  EEG with evidence of a left Ogen density arising from the left central-temporal region with cortical dysfunction left temporal region likely secondary to underlying encephalomalacia, moderate diffuse encephalopathy that is nonspecific and no seizures were noted during recording.  Neurology discontinued Keppra 3/15. ?--Neurology following, appreciate assistance ?--Depakote discontinued due to elevated ammonia level ?--Topamax 300 mg PO BID ? ?Right hemiparesis ([place]) ?Continues with resultant right-sided hemiparesis from multiple CVAs in the past.  [facility] ?--PT/OT recommends SNF, continue therapy efforts while inpatient with plans to discharge back to [facility] once neurology signed off ? ?Closed intertrochanteric fracture of right femur ([place]) ?Patient with recent right intratrochanteric hip fracture.  Underwent IM nail by Dr. Lyla Glassing, on 07/09/2021. ?--Weightbearing as tolerates with walker ?--Lovenox for DVT prophylaxis ?--Outpatient follow-up with orthopedics ? ?Hyponatremia ?Patient presenting with a sodium of 131, likely secondary to prerenal azotemia/dehydration.  Received IV fluid hydration with improvement. ?--Na 131>>133 ? ?Hypokalemia ?Potassium 2.6 on admission.  Repleted.  Potassium is at 4.0 this morning. ?--Repeat electrolytes in the a.m. to include magnesium ? ?Hyperammonemia ([place]) ?Ammonia level was noted to be elevated on 07/10/2021, 76.  Neurology  discontinued Depakote as this could  be playing a factor in its elevation. ?--Repeat ammonia decreased to 46 07/14/2021 ? ?Hypomagnesemia ?Magnesium 2.2 this morning ? ?UTI (urinary tract infection) ?Urinalysis with large leukocytes, negative nitrite, few bacteria, 11-20 WBCs.  Given her encephalopathy, will cover for antibiotics as she is unable to describe any urinary symptoms. ?--Urine culture: 50K GNR; susceptibilities pending ?--Ceftriaxone 1 g IV every 24 hours ? ? ? ? ? ?DVT prophylaxis: enoxaparin (LOVENOX) injection 40 mg Start: 07/14/21 1000 ? ?  Code Status: Full Code ?Family Communication: No family present at bedside this morning ? ?Disposition Plan:  ?Level of care: Telemetry Medical ?Status is: Inpatient ?Remains inpatient appropriate because: Awaiting further recommendations per neurology, anticipate discharge back to [facility] once neurology signed off ?  ? ?Consultants:  ?Neurology ? ?Procedures:  ?EEG ? ?Antimicrobials:  ?Ceftriaxone 3/14 ? ? ?Subjective: ?Patient seen examined at bedside, resting comfortably.  More alert and less confused today.  Neurology discontinued Keppra this morning.  No family present at bedside.  No specific complaints or concerns at this time.  Denies headache, no dizziness, no chest pain, no shortness of breath, no fever/chills/night sweats, no nausea/vomiting/diarrhea, no abdominal pain.  No acute events reported overnight by nursing staff. ? ?Objective: ?Vitals:  ? 07/14/21 2350 07/15/21 0300 07/15/21 0900 07/15/21 1200  ?BP: (!) 105/93 109/71 117/69 116/80  ?Pulse: 86 88 85 93  ?Resp: '20 20 20 18  '$ ?Temp: 99.4 ?F (37.4 ?C) 99.6 ?F (37.6 ?C) 98.9 ?F (37.2 ?C) 98.9 ?F (37.2 ?C)  ?TempSrc: Oral Oral Oral Oral  ?SpO2: 99% 100% 100% 99%  ?Weight:      ?Height:      ? ? ?Intake/Output Summary (Last 24 hours) at 07/15/2021 1307 ?Last data filed at 07/15/2021 0650 ?Gross per 24 hour  ?Intake --  ?Output 1250 ml  ?Net -1250 ml  ? ?Filed Weights  ? 07/12/21 1413 07/12/21  2142  ?Weight: 65 kg 66.5 kg  ? ? ?Examination: ? ?Physical Exam: ?GEN: NAD, alert and oriented to time (March 2023) and [employer]), but not place ([facility]), chronically ill in appearance ?HEENT: NCAT, PERRL, EOMI, sclera clear, dry mucous membranes ?PULM: CTAB w/o wheezes/crackles, normal respiratory effort, on room air ?CV: RRR w/o M/G/R ?GI: abd soft, NTND, NABS, no R/G/M ?MSK: no peripheral edema, muscle strength globally intact 5/5 bilateral upper/lower extremities ?NEURO: Right-sided hemiparesis with muscle strength 4/5 LLE, 3/5 RLE ?PSYCH: normal mood/affect ?Integumentary: dry/intact, no rashes or wounds ? ? ? ?Data Reviewed: I have personally reviewed following labs and imaging studies ? ?CBC: ?Recent Labs  ?Lab 07/12/21 ?1224 07/12/21 ?1554 07/13/21 ?0304 07/14/21 ?0301 07/15/21 ?0145  ?WBC 16.1* 11.3* 13.8* 11.8* 10.2  ?NEUTROABS  --  9.1* 10.9*  --   --   ?HGB 12.8 9.9* 12.7 11.3* 10.4*  ?HCT 35.5* 29.6* 37.2 31.4* 29.9*  ?MCV 100.0 106.9* 105.7* 100.3* 103.5*  ?PLT 98* 95* 128* 124* 129*  ? ?Basic Metabolic Panel: ?Recent Labs  ?Lab 07/11/21 ?0600 07/12/21 ?1554 07/12/21 ?2320 07/13/21 ?0304 07/14/21 ?0301 07/15/21 ?0145  ?NA 135 133*  --  137 134* 133*  ?K 4.3 2.6*  --  3.1* 3.3* 4.0  ?CL 109 109  --  113* 113* 112*  ?CO2 19* 18*  --  16* 16* 15*  ?GLUCOSE 80 93  --  102* 119* 90  ?BUN 38* 31*  --  26* 22 23  ?CREATININE 1.10* 0.92  --  0.83 0.89 0.84  ?CALCIUM 8.2* 7.9*  --  8.5* 8.3*  8.0*  ?MG 2.0  --  1.9  --  1.7 2.2  ?PHOS  --   --  1.9*  --   --   --   ? ?GFR: ?Estimated Creatinine Clearance: 61.7 mL/min (by C-G formula based on SCr of 0.84 mg/dL). ?Liver Function Tests: ?Recent Labs  ?Lab 07/09/21 ?0456 07/12/21 ?1554  ?AST 41 31  ?ALT 28 10  ?ALKPHOS 63 69  ?BILITOT 0.6 0.8  ?PROT 5.6* 4.9*  ?ALBUMIN 2.8* 2.1*  ? ?No results for input(s): LIPASE, AMYLASE in the last 168 hours. ?Recent Labs  ?Lab 07/10/21 ?1828 07/14/21 ?0301  ?AMMONIA 76* 46*  ? ?Coagulation Profile: ?No  results for input(s): INR, PROTIME in the last 168 hours. ?Cardiac Enzymes: ?Recent Labs  ?Lab 07/12/21 ?2320  ?CKTOTAL 41  ? ?BNP (last 3 results) ?No results for input(s): PROBNP in the last 8760 hours. ?HbA1C: ?N

## 2021-07-15 NOTE — Progress Notes (Signed)
Occupational Therapy Treatment ?Patient Details ?Name: Tonya Thompson ?MRN: [id number] ?DOB: 08-16-1955 ?Today's Date: 07/15/2021 ? ? ?History of present illness Patient is a 66 y/o female who presents on 07/12/21 from [facility] with AMS. Found to have acute metabolic encephalopathy, concern for seizures. D/ced to rehab on 07/12/21 and readmited to hospital same night. PMH includes CVA x6 with right sided weakness, AVM of brain, seizures, recent femur fx of RLE s/p IM nail 07/09/21. ?  ?OT comments ? Pt making limited progress with OT this session due to increased fatigue/lethargy. She followed approximately 20% of simple 1 step commands and required frequent multimodal cues to participate. With AAROM, pt was able to participate in LLE exercises, however required PROM for RLE and BUE exercises due to fatigue and pain. All exercises are listed below in the exercise session. Continuing to recommend SNF level therapies to maximize independence and safety.   ? ?Recommendations for follow up therapy are one component of a multi-disciplinary discharge planning process, led by the attending physician.  Recommendations may be updated based on patient status, additional functional criteria and insurance authorization. ?   ?Follow Up Recommendations ? Skilled nursing-short term rehab (<3 hours/day)  ?  ?Assistance Recommended at Discharge Frequent or constant Supervision/Assistance  ?Patient can return home with the following ? Two people to help with walking and/or transfers;Two people to help with bathing/dressing/bathroom;Assistance with cooking/housework;Direct supervision/assist for financial management;Help with stairs or ramp for entrance;Assist for transportation ?  ?Equipment Recommendations ? None recommended by OT  ?  ?Recommendations for Other Services   ? ?  ?Precautions / Restrictions Precautions ?Precautions: Fall ?Precaution Comments: Rt hemiplegia 2* multiple CVAs, R shoulder sublux, hx of  seizures, ?Restrictions ?Weight Bearing Restrictions: Yes ?RLE Weight Bearing: Weight bearing as tolerated  ? ? ?  ? ?Mobility Bed Mobility ?Overal bed mobility: Needs Assistance ?Bed Mobility: Rolling ?Rolling: Total assist, +2 for physical assistance, +2 for safety/equipment ?  ?  ?  ?  ?General bed mobility comments: Pt requiring total A for rolling, increased pain ?  ? ?Transfers ?  ?  ?  ?  ?  ?  ?  ?  ?  ?General transfer comment: unable ?  ?  ?Balance   ?  ?  ?  ?  ?  ?  ?  ?  ?  ?  ?  ?  ?  ?  ?  ?  ?  ?  ?   ? ?ADL either performed or assessed with clinical judgement  ? ?ADL Overall ADL's : Needs assistance/impaired ?  ?  ?  ?  ?  ?  ?  ?  ?  ?  ?  ?  ?  ?  ?  ?  ?  ?  ?  ?General ADL Comments: Session focused on positioning and PROM ?  ? ?Extremity/Trunk Assessment   ?  ?  ?  ?  ?  ? ?Vision   ?  ?  ?Perception   ?  ?Praxis   ?  ? ?Cognition Arousal/Alertness: Lethargic ?Behavior During Therapy: Flat affect ?Overall Cognitive Status: No family/caregiver present to determine baseline cognitive functioning ?  ?  ?  ?  ?  ?  ?  ?  ?  ?  ?  ?  ?  ?  ?  ?  ?General Comments: pt followed 20% of commands this session, responded to 2 questions, continuously fell asleep without multimodal stimulation ?  ?  ?   ?  Exercises Exercises: Other exercises ?Other Exercises ?Other Exercises: AAROM LLE: ankle pumps, knee bends, leg lifts - x10 reps ?Other Exercises: PROM RLE: ankle pumps, micro knee bends - x10 ?Other Exercises: PROM BUE: shoulder flexion, shoulder abduction, bicep curls, wrist flex/ext, finger flex/ext - x10 reps ?Other Exercises: Manual edema massage RLE ? ?  ?Shoulder Instructions   ? ? ?  ?General Comments VSS on RA, pt left on bedpan, RN aware and stated she will check on her  ? ? ?Pertinent Vitals/ Pain       Pain Assessment ?Pain Assessment: Faces ?Faces Pain Scale: Hurts little more ?Pain Location: R hip with movement ?Pain Descriptors / Indicators: Guarding, Grimacing ?Pain Intervention(s): Limited  activity within patient's tolerance, Monitored during session, Repositioned ? ?Home Living   ?  ?  ?  ?  ?  ?  ?  ?  ?  ?  ?  ?  ?  ?  ?  ?  ?  ?  ? ?  ?Prior Functioning/Environment    ?  ?  ?  ?   ? ?Frequency ? Min 2X/week  ? ? ? ? ?  ?Progress Toward Goals ? ?OT Goals(current goals can now be found in the care plan section) ? Progress towards OT goals: Not progressing toward goals - comment (Pt very lethargic this session, minimal interaction) ? ?Acute Rehab OT Goals ?Patient Stated Goal: none stated ?OT Goal Formulation: Patient unable to participate in goal setting ?Time For Goal Achievement: 07/27/21 ?Potential to Achieve Goals: Fair ?ADL Goals ?Pt Will Perform Grooming: with min assist;sitting;bed level ?Pt Will Transfer to Toilet: with mod assist;with +2 assist;stand pivot transfer;bedside commode ?Pt Will Perform Toileting - Clothing Manipulation and hygiene: with min assist;sit to/from stand ?Additional ADL Goal #1: Pt will perform bed mobility with Mod A +2 in preparation for ADLs ?Additional ADL Goal #2: Pt will maintain sitting balance with at EOB with Min A for 5 min in preparation for ADLs ?Additional ADL Goal #3: Pt with sustain attention to grooming task with Mod cues  ?Plan Discharge plan remains appropriate;Frequency remains appropriate   ? ?Co-evaluation ? ? ?   ?  ?  ?  ?  ? ?  ?AM-PAC OT "6 Clicks" Daily Activity     ?Outcome Measure ? ? Help from another person eating meals?: A Lot ?Help from another person taking care of personal grooming?: A Lot ?Help from another person toileting, which includes using toliet, bedpan, or urinal?: Total ?Help from another person bathing (including washing, rinsing, drying)?: A Lot ?Help from another person to put on and taking off regular upper body clothing?: A Lot ?Help from another person to put on and taking off regular lower body clothing?: Total ?6 Click Score: 10 ? ?  ?End of Session   ? ?OT Visit Diagnosis: Other abnormalities of gait and mobility  (R26.89);Pain;Hemiplegia and hemiparesis ?Hemiplegia - Right/Left: Right ?Hemiplegia - dominant/non-dominant: Dominant ?Hemiplegia - caused by: Cerebral infarction ?Pain - Right/Left: Right ?Pain - part of body: Hip ?  ?Activity Tolerance Patient limited by fatigue ?  ?Patient Left in bed;with bed alarm set;with call bell/phone within reach ?  ?Nurse Communication Mobility status ?  ? ?   ? ?Time: 7494-4967 ?OT Time Calculation (min): 17 min ? ?Charges: OT General Charges ?$OT Visit: 1 Visit ?OT Treatments ?$Therapeutic Activity: 8-22 mins ? ?Tamla Winkels H., OTR/L ?Acute Rehabilitation ? ? ?Kolbey Teichert Elane Yolanda Bonine ?07/15/2021, 5:55 PM ?

## 2021-07-16 DIAGNOSIS — G9341 Metabolic encephalopathy: Secondary | ICD-10-CM | POA: Diagnosis not present

## 2021-07-16 LAB — BASIC METABOLIC PANEL
Anion gap: 8 (ref 5–15)
BUN: 19 mg/dL (ref 8–23)
CO2: 15 mmol/L — ABNORMAL LOW (ref 22–32)
Calcium: 8 mg/dL — ABNORMAL LOW (ref 8.9–10.3)
Chloride: 103 mmol/L (ref 98–111)
Creatinine, Ser: 0.83 mg/dL (ref 0.44–1.00)
GFR, Estimated: >60 mL/min (ref >60)
Glucose, Bld: 127 mg/dL — ABNORMAL HIGH (ref 70–99)
Potassium: 3.1 mmol/L — ABNORMAL LOW (ref 3.5–5.1)
Sodium: 126 mmol/L — ABNORMAL LOW (ref 135–145)

## 2021-07-16 LAB — URINE CULTURE: Culture: 50000 — AB

## 2021-07-16 LAB — CBC
HCT: 31.7 % — ABNORMAL LOW (ref 36.0–46.0)
Hemoglobin: 10.8 g/dL — ABNORMAL LOW (ref 12.0–15.0)
MCH: 35.5 pg — ABNORMAL HIGH (ref 26.0–34.0)
MCHC: 34.1 g/dL (ref 30.0–36.0)
MCV: 104.3 fL — ABNORMAL HIGH (ref 80.0–100.0)
Platelets: 159 10*3/uL (ref 150–400)
RBC: 3.04 MIL/uL — ABNORMAL LOW (ref 3.87–5.11)
RDW: 13.4 % (ref 11.5–15.5)
WBC: 10.3 10*3/uL (ref 4.0–10.5)
nRBC: 0 % (ref 0.0–0.2)

## 2021-07-16 LAB — OSMOLALITY: Osmolality: 282 mOsm/kg (ref 275–295)

## 2021-07-16 LAB — SODIUM: Sodium: 135 mmol/L (ref 135–145)

## 2021-07-16 MED ORDER — CIPROFLOXACIN HCL 500 MG PO TABS
500.0000 mg | ORAL_TABLET | Freq: Two times a day (BID) | ORAL | Status: DC
  Administered 2021-07-16 – 2021-07-17 (×2): 500 mg via ORAL
  Filled 2021-07-16 (×2): qty 1

## 2021-07-16 MED ORDER — POTASSIUM CHLORIDE 20 MEQ PO PACK
40.0000 meq | PACK | ORAL | Status: AC
  Administered 2021-07-16 (×2): 40 meq via ORAL
  Filled 2021-07-16 (×2): qty 2

## 2021-07-16 MED ORDER — POTASSIUM CHLORIDE CRYS ER 20 MEQ PO TBCR
40.0000 meq | EXTENDED_RELEASE_TABLET | ORAL | Status: DC
  Filled 2021-07-16: qty 2

## 2021-07-16 MED ORDER — SODIUM CHLORIDE 0.9 % IV SOLN: INTRAVENOUS | Status: AC

## 2021-07-16 NOTE — Progress Notes (Signed)
Physical Therapy Treatment ?Patient Details ?Name: Tonya Thompson ?MRN: [id number] ?DOB: [birth year] ?Today's Date: 07/16/2021 ? ? ?History of Present Illness Patient is a 66 y/o female who presents on 07/12/21 from [facility] with AMS. Found to have acute metabolic encephalopathy, concern for seizures. D/ced to rehab on 07/12/21 and readmited to hospital same night. PMH includes CVA x6 with right sided weakness, AVM of brain, seizures, recent femur fx of RLE s/p IM nail 07/09/21. ? ?  ?PT Comments  ? ?  ?Focus of session today was functional bed mobility, positioning, rolling, sitting balance, and repeated partial stand practice. The patient tolerated well but was limited secondary to lethargy.  Pt. Shows overall improvement with her processing ability and usage of her L UE/LE. The chronic R hemiparesis, limiting sitting balance, functional mobility, and cognitive processing are still limiting function. Pt. Would benefit from skilled PT to continue to address her functional mobility, balance, and transfer ability. Plan and discharge setting remains unchanged. Pt to follow acutely as appropriate.  ?   ?Recommendations for follow up therapy are one component of a multi-disciplinary discharge planning process, led by the attending physician.  Recommendations may be updated based on patient status, additional functional criteria and insurance authorization. ? ?Follow Up Recommendations ? Skilled nursing-short term rehab (<3 hours/day) ?  ?  ?Assistance Recommended at Discharge Frequent or constant Supervision/Assistance  ?Patient can return home with the following Two people to help with walking and/or transfers;Two people to help with bathing/dressing/bathroom;Direct supervision/assist for medications management;Help with stairs or ramp for entrance;Assist for transportation;Assistance with feeding;Assistance with cooking/housework;Direct supervision/assist for financial management ?  ?Equipment Recommendations ? None  recommended by PT  ?  ?Recommendations for Other Services   ? ? ?  ?Precautions / Restrictions Precautions ?Precautions: Fall ?Precaution Comments: Rt hemiplegia 2* multiple CVAs, R shoulder sublux, hx of seizures, ?Restrictions ?Weight Bearing Restrictions: Yes ?RLE Weight Bearing: Weight bearing as tolerated  ?  ? ?Mobility ? Bed Mobility ?Overal bed mobility: Needs Assistance ?Bed Mobility: Rolling ?Rolling: Max assist, +2 for physical assistance, +2 for safety/equipment ?  ?Supine to sit: Total assist, +2 for physical assistance, HOB elevated, +2 for safety/equipment ?Sit to supine: Total assist, +2 for physical assistance, HOB elevated, +2 for safety/equipment ?  ?General bed mobility comments: Max A for L rolling with cues and positioning. Pt was able to assist with her arms. Total for supine<>sit for truncal and LE management ?  ? ?Transfers ?  ?  ?  ?  ?  ?  ?  ?  ?  ?General transfer comment: 4x attempted STS, max A 2+, made it to ~50% standing ?  ? ?Ambulation/Gait ?  ?  ?  ?  ?  ?  ?  ?  ? ? ?Stairs ?  ?  ?  ?  ?  ? ? ?Wheelchair Mobility ?  ? ?Modified Rankin (Stroke Patients Only) ?Modified Rankin (Stroke Patients Only) ?Pre-Morbid Rankin Score: Severe disability ?Modified Rankin: Severe disability ? ? ?  ?Balance Overall balance assessment: Needs assistance ?Sitting-balance support: Feet supported, Single extremity supported ?Sitting balance-Leahy Scale: Poor ?Sitting balance - Comments: Pt. able to maintain sittin balance for limited amounts of time, requires at least L UE holding onto bed rail or PT for support. Would fatigue and lean fully posteriorly on supporting PT. ?Postural control: Posterior lean ?  ?  ?Standing balance comment: deferred/unable ?  ?  ?  ?  ?  ?  ?  ?  ?  ?  ?  ?  ? ?  ?  Cognition Arousal/Alertness: Lethargic ?Behavior During Therapy: Flat affect ?Overall Cognitive Status: No family/caregiver present to determine baseline cognitive functioning ?  ?  ?  ?  ?  ?  ?  ?  ?  ?  ?  ?   ?  ?  ?  ?  ?General Comments: Pt. shows increased command following today and responded to most questions. She was unable to fully complete her responses and would have to be reprimed towards the question. She did present with the ability to request mobility and what she wanted to do during the session. ?  ?  ? ?  ?Exercises General Exercises - Lower Extremity ?Ankle Circles/Pumps: AROM, Left, 10 reps ?Heel Slides: AAROM, Left, 10 reps ? ?  ?General Comments   ?  ?  ? ?Pertinent Vitals/Pain Pain Assessment ?Pain Assessment: Faces ?Faces Pain Scale: Hurts even more ?Pain Location: R hip/leg ?Pain Descriptors / Indicators: Guarding, Grimacing, Aching ?Pain Intervention(s): Limited activity within patient's tolerance, Monitored during session, Repositioned  ? ? ?Home Living   ?  ?  ?  ?  ?  ?  ?  ?  ?  ?   ?  ?Prior Function    ?  ?  ?   ? ?PT Goals (current goals can now be found in the care plan section) Acute Rehab PT Goals ?Patient Stated Goal: none stated ?PT Goal Formulation: Patient unable to participate in goal setting ?Time For Goal Achievement: 07/27/21 ?Potential to Achieve Goals: Fair ?Progress towards PT goals: Progressing toward goals ? ?  ?Frequency ? ? ? Min 3X/week ? ? ? ?  ?PT Plan Current plan remains appropriate  ? ? ?Co-evaluation   ?  ?  ?  ?  ? ?  ?AM-PAC PT "6 Clicks" Mobility   ?Outcome Measure ? Help needed turning from your back to your side while in a flat bed without using bedrails?: A Lot ?Help needed moving from lying on your back to sitting on the side of a flat bed without using bedrails?: A Lot ?Help needed moving to and from a bed to a chair (including a wheelchair)?: Total ?Help needed standing up from a chair using your arms (e.g., wheelchair or bedside chair)?: Total ?Help needed to walk in hospital room?: Total ?Help needed climbing 3-5 steps with a railing? : Total ?6 Click Score: 8 ? ?  ?End of Session Equipment Utilized During Treatment: Gait belt ?Activity Tolerance: Patient  limited by lethargy ?Patient left: in bed;with call bell/phone within reach;with bed alarm set ?Nurse Communication: Mobility status ?PT Visit Diagnosis: Other symptoms and signs involving the nervous system (R29.898);Muscle weakness (generalized) (M62.81);History of falling (Z91.81);Hemiplegia and hemiparesis ?Hemiplegia - Right/Left: Right ?Hemiplegia - dominant/non-dominant: Dominant ?Hemiplegia - caused by:  (history of 6 CVAs) ?  ? ? ?Time: 5686-1683 ?PT Time Calculation (min) (ACUTE ONLY): 24 min ? ?Charges:  $Therapeutic Activity: 8-22 mins ?$Neuromuscular Re-education: 8-22 mins          ?          ? ?Thermon Leyland, SPT ?Acute Rehab Services ? ? ? ?Thermon Leyland ?07/16/2021, 3:43 PM ? ?

## 2021-07-16 NOTE — Progress Notes (Signed)
Speech Language Pathology Treatment: Dysphagia  ?Patient Details ?Name: Tonya Thompson ?MRN: [id number] ?DOB: 04-03-1956 ?Today's Date: 07/16/2021 ?Time: 7619-5093 ?SLP Time Calculation (min) (ACUTE ONLY): 14 min ? ?Assessment / Plan / Recommendation ?Clinical Impression ? Pt has a significant right side lean and after repositioning therapist assisted with portion of breakfast. Her attention was intermittently focused on pain, positioning and overall internal distractions leading to mildly prolonged oral manipulation with Dys 2 texture ( scrambled egg, oatmeal). Immediate cough with one of 6 sips thin possibly due to decreased oral control. Given mentation, attention and observation, recommend continue Dys 2 texture which would allow less mastication and manipulation. Continue thin liquids with straw but controlling amount. Meds whole in puree. Continue ST.  ?HPI HPI: Tonya Thompson is a 66 year old female with past medical history significant for cerebral AVMs, history of CVA x5 with resulting right-sided hemiparesis, seizure, CKD stage IIIb, urolithiasis, GERD, HLD and recent right femur fracture s/p intramedullary fixation who [facility].  Patient just discharged from the hospital and sent right back. Chest x-ray with no acute cardiopulmonary disease process.  MR brain without contrast with no evidence for acute or subacute infarct, large area of encephalomalacia involving the left cerebral hemisphere, limited due to inability to tolerate full length of the study. MD suspects seizure. No prior ST notes. ?  ?   ?SLP Plan ? Continue with current plan of care ? ?  ?  ?Recommendations for follow up therapy are one component of a multi-disciplinary discharge planning process, led by the attending physician.  Recommendations may be updated based on patient status, additional functional criteria and insurance authorization. ?  ? ?Recommendations  ?Diet  recommendations: Dysphagia 2 (fine chop);Thin liquid ?Liquids provided via: Straw;Cup ?Medication Administration: Whole meds with puree ?Supervision: Staff to assist with self feeding;Full supervision/cueing for compensatory strategies ?Compensations: Minimize environmental distractions;Slow rate;Small sips/bites;Lingual sweep for clearance of pocketing ?Postural Changes and/or Swallow Maneuvers: Seated upright 90 degrees  ?   ?    ?   ? ? ? ? Oral Care Recommendations: Oral care BID ?Follow Up Recommendations: Skilled nursing-short term rehab (<3 hours/day) ?Assistance recommended at discharge: Frequent or constant Supervision/Assistance ?SLP Visit Diagnosis: Dysphagia, unspecified (R13.10) ?Plan: Continue with current plan of care ? ? ? ? ?  ?  ? ? ?Houston Siren ? ?07/16/2021, 10:15 AM ?

## 2021-07-16 NOTE — Progress Notes (Signed)
NEUROLOGY CONSULTATION PROGRESS NOTE  ? ?Date of service: July 16, 2021 ?Patient Name: Tonya Thompson ?MRN:  [id number] ?DOB:  [birth year] ? 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  She was recently discharged after a femoral fracture but was sent back to the hospital by SNF due to altered mental status.  Per report of the patient's relative, the patient is able to ambulate independently and is able to engage in conversation with some verbal deficits at baseline.  She is normally alert and oriented. The relative denies recurrent encephalopathic episodes and states the patient has been seizure-free since around the year 2000. ?  ?Interval Hx  ? ?On assessment this morning, the patient exhibits improvement compared to yesterday.  She is alert and able to engage in conversation.  She is able to perform simple math problems and has intact attention.  She reports some headaches, mostly in the evening.  (Is possibly a result of removing her Depakote, which was originally prescribed for migraine prophylaxis).  ? ?Vitals  ? ?Vitals:  ? 07/15/21 2055 07/16/21 0006 07/16/21 0413 07/16/21 0750  ?BP: (!) 105/59 115/74 115/73 96/68  ?Pulse: 85 83 93 87  ?Resp: (!) 24 (!) 22 (!) 22 (!) 22  ?Temp: 97.7 ?F (36.5 ?C) 98.3 ?F (36.8 ?C) 98.3 ?F (36.8 ?C) 98.5 ?F (36.9 ?C)  ?TempSrc: Oral Oral Axillary Axillary  ?SpO2: 100% 100% 100% 97%  ?Weight:      ?Height:      ?  ? ?Body mass index is 24.4 kg/m?. ? ?Physical Exam  ?General: Lying comfortably in bed; in no acute distress.  ?HENT: Normal oropharynx and mucosa. Normal external appearance of ears and nose.  ?Neck: Supple, no pain or tenderness  ?CV: No peripheral edema.  ?Pulmonary: Symmetric Chest rise. Normal respiratory effort.  ?Ext: No cyanosis, edema, or deformity  ?Skin: No rash. Normal  palpation of skin.   ?  ?Neurologic Examination  ?Mental status/Cognition: Alert, oriented to person, place, and events, but not time ?Speech/language: spontaneous, but she does have significant pauses, difficulty with word finding, and has some difficulty expressing her ideas. ?Cranial nerves:  ? CN II Pupils equal and reactive to light, no VF deficits   ? CN III,IV,VI EOM intact  ? CN V normal sensation in V1, V2, and V3 segments bilaterally   ? CN VII no asymmetry, no nasolabial fold flattening   ? CN VIII normal hearing to speech   ? CN IX & X normal palatal elevation, no uvular deviation   ? CN XI 5/5 shoulder shrug on the left, decreased on the right  ? CN XII midline tongue protrusion   ?  ?Motor:  ?Mvmt Root Nerve  Muscle Right Left Comments  ?SA C5/6 Ax Deltoid 0/5 3/5    ?EF C5/6 Mc Biceps 0/5 3/5    ?EE C6/7/8 Rad Triceps 0/5 3/5    ?WF C6/7 Med FCR        ?WE C7/8 PIN ECU        ?F Ab C8/T1 U ADM/FDI  1/5      ?HF L1/2/3 Fem Iliopsoas 0/5 2/5    ?KE L2/3/4 Fem Quad 0/5 2/5    ?DF L4/5 D Peron Tib Ant        ?PF S1/2 Tibial Grc/Sol  1/5      ? ?  ?  Sensation: ? Light touch Diminished in RUE and RLE  ? Pin prick    ? Temperature    ? Vibration    ?Proprioception    ?  ?Coordination/Complex Motor:  ?- Gait: deferred ? ? ? ?Labs  ? ?Basic Metabolic Panel:  ?Lab Results  ?Component Value Date  ? NA 126 (L) 07/16/2021  ? K 3.1 (L) 07/16/2021  ? CO2 15 (L) 07/16/2021  ? GLUCOSE 127 (H) 07/16/2021  ? BUN 19 07/16/2021  ? CREATININE 0.83 07/16/2021  ? CALCIUM 8.0 (L) 07/16/2021  ? GFRNONAA >60 07/16/2021  ? GFRAA 47 (L) 06/26/2020  ? ?HbA1c:  ?Lab Results  ?Component Value Date  ? HGBA1C 4.8 12/19/2020  ? ?LDL:  ?Lab Results  ?Component Value Date  ? [place] 85 07/09/2021  ? ?Urine Drug Screen: No results found for: LABOPIA, COCAINSCRNUR, [place], [place], THCU, LABBARB  ?Alcohol Level  ?   ?Component Value Date/Time  ? ETH <10 07/12/2021 1554  ? ?No results found for: PHENYTOIN, ZONISAMIDE, LAMOTRIGINE,  LEVETIRACETA ?Lab Results  ?Component Value Date  ? VALPROATE 94 07/12/2021  ? ? ?Imaging and Diagnostic studies  ? ?CT-H ?IMPRESSION: ?1. No acute intracranial pathology. ?2. 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 ?  ?MR BRAIN WO CONTRAST ?IMPRESSION: ?1. Technically limited exam due to the patient's inability to ?tolerate the full length of the study. Axial and coronal DWI ?sequences only were performed. ?2. No evidence for acute or subacute infarct. ?3. Large area of encephalomalacia involving the left cerebral ?hemisphere with associated ex vacuo dilatation of the left lateral ?ventricle, stable. ? ? ?Impression  ? 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  She was recently discharged after a femoral fracture but was sent back to the hospital by SNF due to altered mental status.  Per report of the patient's relative, the patient is able to ambulate independently and is able to engage in conversation with some verbal deficits at baseline.  She is normally alert and oriented. The relative denies recurrent encephalopathic episodes and states the patient has been seizure-free since around the year 2000. ? 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  Her improvement thus far is reassuring.  ? ?Recommendations  ? ?-Home Depakote discontinued ?-Continue home Topamax 300 mg BID ?-Recommend outpatient follow up with her neurologist, Dr. Delice Lesch, after discharge to reassess need for a new headache medication.  ? ?We will sign off at this time.  Please reconsult should the patient's condition require. ? ? ?Corky Sox, MD ?PGY-1 ? ? ?I have seen the patient and reviewed the above note.  She is improving day over day, and I suspect that Depakote likely was playing a role in her encephalopathy.  At this time, I would expect continued gradual improvement.   ? ?No further neurodiagnostic testing needed at this time, I would continue supportive therapy as you are doing, neurology will be available on an as-needed basis moving forward. ? ?Roland Rack, MD ?[facility] ?[phone] ? ?If 7pm- 7am, please page neurology on call as listed in [place]. ? ?

## 2021-07-16 NOTE — Progress Notes (Addendum)
?PROGRESS NOTE ? ? ? ?Tonya Thompson  DJM:[id number] DOB: 1956-02-12 DOA: 07/12/2021 ?PCP: Unk Pinto, MD  ? ? ?Brief Narrative:  ?Tonya Thompson is a 66 year old female with past medical history significant for cerebral AVMs, history of CVA x5 with resulting right-sided hemiparesis, history of seizure, CKD stage IIIb, urolithiasis, GERD, HLD and recent right femur fracture s/p intramedullary fixation who [facility] for [facility] status.  Patient was just discharged from the hospital following admission for hip fracture.  After arriving at the facility, staff noted that she seemed more confused than her reported baseline; and was immediately sent back to the ED for further evaluation. ? ?In the ED, temperature 97.5 ?F, HR 103, RR 18, BP 124/93, SPO2 98% on room air. WBC 11.3, hemoglobin 9.9, platelets 95.  Sodium 133, potassium 2.6, chloride 109, CO2 18, glucose 93, BUN 31, creatinine 0.92.  AST 31, ALT 10.  Total bilirubin 0.8.  Urinalysis with large leukocytes, negative nitrite, few bacteria, 11-20 WBCs.  EtOH level less than 10.  CT head without contrast with no acute intracranial pathology, extensive encephalomalacia of the left cerebral hemisphere with ex vacuo dilation of the left lateral ventricle.  Chest x-ray with no acute cardiopulmonary disease process.  MR brain without contrast with no evidence for acute or subacute infarct, large area of encephalomalacia involving the left cerebral hemisphere, limited due to inability to tolerate full length of the study.  EDP discussed with on-call neurologist who recommended transfer to [facility] for EEG for concern of seizure.  Hospital service was consulted and patient was transferred to [facility] for further evaluation and management. ?  ?  ?Assessment and Plan: ?* Acute metabolic encephalopathy ?Patient presenting to the ED from SNF with altered mental status, confusion after recent hospitalization  for hip fracture.  Patient with history of multiple CVAs, seizure disorder, AVMs of the cerebrum.  CT head and MRI brain with no acute intracranial findings.  Chest x-ray negative for acute cardiopulmonary disease process.  Urinalysis unrevealing.  High suspicion for possible recurrent seizure.  Also consideration for elevated ammonia level during previous hospitalization, was 76 on 07/10/2021. ?--SLP following, on dysphagia 2 diet ? ?Seizure disorder ([place]) ?Apparently patient has been seizure-free since around 2000.  EEG with evidence of a left Ogen density arising from the left central-temporal region with cortical dysfunction left temporal region likely secondary to underlying encephalomalacia, moderate diffuse encephalopathy that is nonspecific and no seizures were noted during recording.  Neurology discontinued Keppra; also Depakote was discontinued due to elevated ammonia level.  Neurology now signed off with recommendations to follow-up with her primary neurologist, Dr. Delice Lesch.  ?--Neurology following, appreciate assistance ?--Topamax 300 mg PO BID ? ?Right hemiparesis ([place]) ?Continues with resultant right-sided hemiparesis from multiple CVAs in the past.  [facility] ?--PT/OT recommends SNF, continue therapy efforts while inpatient with plans to discharge back to [facility]; likely tomorrow ? ?Closed intertrochanteric fracture of right femur ([place]) ?Patient with recent right intratrochanteric hip fracture.  Underwent IM nail by Dr. Lyla Glassing, on 07/09/2021. ?--Weightbearing as tolerates with walker ?--Lovenox for DVT prophylaxis ?--Outpatient follow-up with orthopedics ? ?Hyponatremia ?Patient presenting with a sodium of 131, likely secondary to prerenal azotemia/dehydration.  Received IV fluid hydration with improvement. ?--Na 131>>133>126 ?--Serum osmolality, urine osmolality, urine sodium ?--IVF with normal saline at 75 mL/h ?--Repeat BMP in a.m. ? ?Hypokalemia ?Potassium 3.1 this  morning, will replete. ?--Repeat electrolytes in  the a.m. to include magnesium ? ?Hyperammonemia ([place]) ?Ammonia level was noted to be elevated on 07/10/2021, 76.  Neurology discontinued Depakote as this could be playing a factor in its elevation. ?--Repeat ammonia decreased to 46 07/14/2021 ? ?Hypomagnesemia ?Repleted.  Repeat magnesium in the a.m. ? ?UTI (urinary tract infection) ?Urinalysis with large leukocytes, negative nitrite, few bacteria, 11-20 WBCs.  Urine culture positive for E. coli and Enterobacter cloacae .   ?--Change ceftriaxone to Cipro 500 mg p.o. twice daily for coverage given susceptibility testing ? ? ? ? ? ?DVT prophylaxis: enoxaparin (LOVENOX) injection 40 mg Start: 07/14/21 1000 ? ?  Code Status: Full Code ?Family Communication: No family present at bedside this morning ? ?Disposition Plan:  ?Level of care: Telemetry Medical ?Status is: Inpatient ?Remains inpatient appropriate because: Awaiting further recommendations per neurology, anticipate discharge back to [facility] once neurology signed off ?  ? ?Consultants:  ?Neurology - signed off 3/16 ? ?Procedures:  ?EEG ? ?Antimicrobials:  ?Ceftriaxone 3/14 ? ? ?Subjective: ?Patient seen examined at bedside, resting comfortably.  No complaints this morning.  Neurology now signed off.  Sodium has dropped to 126 this morning.  Will reorder urine electrolytes/urine osmolality and serum osmolality.  Restarting IV fluids.  Requesting assistance with her breakfast this morning.  No other complaints or concerns at this time.  Denies headache, no chest pain, no shortness of breath, no fever/chills/night sweats, no nausea/vomiting/diarrhea, no abdominal pain.  No acute events reported overnight by nursing staff. ? ?Objective: ?Vitals:  ? 07/16/21 1000 07/16/21 1030 07/16/21 1100 07/16/21 1117  ?BP:    (!) 120/97  ?Pulse: (!) 112 (!) 110 (!) 103 75  ?Resp: 19 (!) 24 (!) 24 20  ?Temp:    98.5 ?F (36.9 ?C)  ?TempSrc:    Oral  ?SpO2: 99% 100% 96% 95%   ?Weight:      ?Height:      ? ? ?Intake/Output Summary (Last 24 hours) at 07/16/2021 1723 ?Last data filed at 07/16/2021 1100 ?Gross per 24 hour  ?Intake 136.73 ml  ?Output 700 ml  ?Net -563.27 ml  ? ?Filed Weights  ? 07/12/21 1413 07/12/21 2142  ?Weight: 65 kg 66.5 kg  ? ? ?Examination: ? ?Physical Exam: ?GEN: NAD, alert and oriented to time (March 2023) and [employer] pilot), but not place [place] but cannot tell me where she actually is), chronically ill in appearance ?HEENT: NCAT, PERRL, EOMI, sclera clear, dry mucous membranes ?PULM: CTAB w/o wheezes/crackles, normal respiratory effort, on room air ?CV: RRR w/o M/G/R ?GI: abd soft, NTND, NABS, no R/G/M ?MSK: no peripheral edema ?NEURO: Right-sided hemiparesis with muscle strength 4/5 LLE, 3/5 RLE ?PSYCH: normal mood/affect ?Integumentary: dry/intact, no rashes or wounds ? ? ? ?Data Reviewed: I have personally reviewed following labs and imaging studies ? ?CBC: ?Recent Labs  ?Lab 07/12/21 ?1554 07/13/21 ?0304 07/14/21 ?0301 07/15/21 ?0145 07/16/21 ?0414  ?WBC 11.3* 13.8* 11.8* 10.2 10.3  ?NEUTROABS 9.1* 10.9*  --   --   --   ?HGB 9.9* 12.7 11.3* 10.4* 10.8*  ?HCT 29.6* 37.2 31.4* 29.9* 31.7*  ?MCV 106.9* 105.7* 100.3* 103.5* 104.3*  ?PLT 95* 128* 124* 129* 159  ? ?Basic Metabolic Panel: ?Recent Labs  ?Lab 07/11/21 ?0600 07/12/21 ?1554 07/12/21 ?2320 07/13/21 ?0304 07/14/21 ?0301 07/15/21 ?0145 07/16/21 ?0160 07/16/21 ?1403  ?NA 135 133*  --  137 134* 133* 126* 135  ?K 4.3 2.6*  --  3.1* 3.3* 4.0 3.1*  --   ?CL 109 109  --  113* 113* 112* 103  --   ?CO2 19* 18*  --  16* 16* 15* 15*  --   ?GLUCOSE 80 93  --  102* 119* 90 127*  --   ?BUN 38* 31*  --  26* '22 23 19  '$ --   ?CREATININE 1.10* 0.92  --  0.83 0.89 0.84 0.83  --   ?CALCIUM 8.2* 7.9*  --  8.5* 8.3* 8.0* 8.0*  --   ?MG 2.0  --  1.9  --  1.7 2.2  --   --   ?PHOS  --   --  1.9*  --   --   --   --   --   ? ?GFR: ?Estimated Creatinine Clearance: 62.4 mL/min (by C-G formula based on SCr of 0.83  mg/dL). ?Liver Function Tests: ?Recent Labs  ?Lab 07/12/21 ?1554  ?AST 31  ?ALT 10  ?ALKPHOS 69  ?BILITOT 0.8  ?PROT 4.9*  ?ALBUMIN 2.1*  ? ?No results for input(s): LIPASE, AMYLASE in the last 168 hours. ?Recent [place]

## 2021-07-17 DIAGNOSIS — R4182 Altered mental status, unspecified: Secondary | ICD-10-CM | POA: Diagnosis not present

## 2021-07-17 DIAGNOSIS — I69351 Hemiplegia and hemiparesis following cerebral infarction affecting right dominant side: Secondary | ICD-10-CM | POA: Diagnosis not present

## 2021-07-17 DIAGNOSIS — I2510 Atherosclerotic heart disease of native coronary artery without angina pectoris: Secondary | ICD-10-CM | POA: Diagnosis not present

## 2021-07-17 DIAGNOSIS — D693 Immune thrombocytopenic purpura: Secondary | ICD-10-CM | POA: Diagnosis not present

## 2021-07-17 DIAGNOSIS — M549 Dorsalgia, unspecified: Secondary | ICD-10-CM | POA: Diagnosis not present

## 2021-07-17 DIAGNOSIS — B9689 Other specified bacterial agents as the cause of diseases classified elsewhere: Secondary | ICD-10-CM | POA: Diagnosis not present

## 2021-07-17 DIAGNOSIS — E876 Hypokalemia: Secondary | ICD-10-CM | POA: Diagnosis not present

## 2021-07-17 DIAGNOSIS — R0989 Other specified symptoms and signs involving the circulatory and respiratory systems: Secondary | ICD-10-CM | POA: Diagnosis not present

## 2021-07-17 DIAGNOSIS — R519 Headache, unspecified: Secondary | ICD-10-CM | POA: Diagnosis not present

## 2021-07-17 DIAGNOSIS — D500 Iron deficiency anemia secondary to blood loss (chronic): Secondary | ICD-10-CM | POA: Diagnosis not present

## 2021-07-17 DIAGNOSIS — R918 Other nonspecific abnormal finding of lung field: Secondary | ICD-10-CM | POA: Diagnosis not present

## 2021-07-17 DIAGNOSIS — N1832 Chronic kidney disease, stage 3b: Secondary | ICD-10-CM | POA: Diagnosis not present

## 2021-07-17 DIAGNOSIS — R7989 Other specified abnormal findings of blood chemistry: Secondary | ICD-10-CM | POA: Diagnosis not present

## 2021-07-17 DIAGNOSIS — R410 Disorientation, unspecified: Secondary | ICD-10-CM | POA: Diagnosis not present

## 2021-07-17 DIAGNOSIS — N390 Urinary tract infection, site not specified: Secondary | ICD-10-CM | POA: Diagnosis not present

## 2021-07-17 DIAGNOSIS — R269 Unspecified abnormalities of gait and mobility: Secondary | ICD-10-CM | POA: Diagnosis not present

## 2021-07-17 DIAGNOSIS — R1310 Dysphagia, unspecified: Secondary | ICD-10-CM | POA: Diagnosis not present

## 2021-07-17 DIAGNOSIS — G43009 Migraine without aura, not intractable, without status migrainosus: Secondary | ICD-10-CM | POA: Diagnosis not present

## 2021-07-17 DIAGNOSIS — E782 Mixed hyperlipidemia: Secondary | ICD-10-CM | POA: Diagnosis not present

## 2021-07-17 DIAGNOSIS — M25551 Pain in right hip: Secondary | ICD-10-CM | POA: Diagnosis not present

## 2021-07-17 DIAGNOSIS — E872 Acidosis, unspecified: Secondary | ICD-10-CM | POA: Diagnosis not present

## 2021-07-17 DIAGNOSIS — B9620 Unspecified Escherichia coli [E. coli] as the cause of diseases classified elsewhere: Secondary | ICD-10-CM | POA: Diagnosis not present

## 2021-07-17 DIAGNOSIS — R41841 Cognitive communication deficit: Secondary | ICD-10-CM | POA: Diagnosis not present

## 2021-07-17 DIAGNOSIS — R4189 Other symptoms and signs involving cognitive functions and awareness: Secondary | ICD-10-CM | POA: Diagnosis not present

## 2021-07-17 DIAGNOSIS — E7220 Disorder of urea cycle metabolism, unspecified: Secondary | ICD-10-CM | POA: Diagnosis not present

## 2021-07-17 DIAGNOSIS — S72141D Displaced intertrochanteric fracture of right femur, subsequent encounter for closed fracture with routine healing: Secondary | ICD-10-CM | POA: Diagnosis not present

## 2021-07-17 DIAGNOSIS — G40209 Localization-related (focal) (partial) symptomatic epilepsy and epileptic syndromes with complex partial seizures, not intractable, without status epilepticus: Secondary | ICD-10-CM | POA: Diagnosis not present

## 2021-07-17 DIAGNOSIS — R29898 Other symptoms and signs involving the musculoskeletal system: Secondary | ICD-10-CM | POA: Diagnosis not present

## 2021-07-17 DIAGNOSIS — K219 Gastro-esophageal reflux disease without esophagitis: Secondary | ICD-10-CM | POA: Diagnosis not present

## 2021-07-17 DIAGNOSIS — Z7401 Bed confinement status: Secondary | ICD-10-CM | POA: Diagnosis not present

## 2021-07-17 DIAGNOSIS — G9341 Metabolic encephalopathy: Secondary | ICD-10-CM | POA: Diagnosis not present

## 2021-07-17 DIAGNOSIS — Z9889 Other specified postprocedural states: Secondary | ICD-10-CM | POA: Diagnosis not present

## 2021-07-17 DIAGNOSIS — G8191 Hemiplegia, unspecified affecting right dominant side: Secondary | ICD-10-CM | POA: Diagnosis not present

## 2021-07-17 DIAGNOSIS — M25572 Pain in left ankle and joints of left foot: Secondary | ICD-10-CM | POA: Diagnosis not present

## 2021-07-17 DIAGNOSIS — I2584 Coronary atherosclerosis due to calcified coronary lesion: Secondary | ICD-10-CM | POA: Diagnosis not present

## 2021-07-17 DIAGNOSIS — S72141A Displaced intertrochanteric fracture of right femur, initial encounter for closed fracture: Secondary | ICD-10-CM | POA: Diagnosis not present

## 2021-07-17 DIAGNOSIS — K5901 Slow transit constipation: Secondary | ICD-10-CM | POA: Diagnosis not present

## 2021-07-17 DIAGNOSIS — G40909 Epilepsy, unspecified, not intractable, without status epilepticus: Secondary | ICD-10-CM | POA: Diagnosis not present

## 2021-07-17 DIAGNOSIS — Z8673 Personal history of transient ischemic attack (TIA), and cerebral infarction without residual deficits: Secondary | ICD-10-CM | POA: Diagnosis not present

## 2021-07-17 DIAGNOSIS — R2689 Other abnormalities of gait and mobility: Secondary | ICD-10-CM | POA: Diagnosis not present

## 2021-07-17 LAB — BASIC METABOLIC PANEL
Anion gap: 5 (ref 5–15)
BUN: 18 mg/dL (ref 8–23)
CO2: 17 mmol/L — ABNORMAL LOW (ref 22–32)
Calcium: 8.1 mg/dL — ABNORMAL LOW (ref 8.9–10.3)
Chloride: 113 mmol/L — ABNORMAL HIGH (ref 98–111)
Creatinine, Ser: 0.76 mg/dL (ref 0.44–1.00)
GFR, Estimated: >60 mL/min (ref >60)
Glucose, Bld: 85 mg/dL (ref 70–99)
Potassium: 3.7 mmol/L (ref 3.5–5.1)
Sodium: 135 mmol/L (ref 135–145)

## 2021-07-17 LAB — MAGNESIUM: Magnesium: 1.7 mg/dL (ref 1.7–2.4)

## 2021-07-17 MED ORDER — CIPROFLOXACIN HCL 500 MG PO TABS: 500.0000 mg | ORAL_TABLET | Freq: Two times a day (BID) | ORAL | 0 refills | Status: AC

## 2021-07-17 MED ORDER — MAGNESIUM SULFATE 2 GM/50ML IV SOLN
2.0000 g | Freq: Once | INTRAVENOUS | Status: AC
  Administered 2021-07-17: 2 g via INTRAVENOUS
  Filled 2021-07-17: qty 50

## 2021-07-17 MED ORDER — SODIUM BICARBONATE 650 MG PO TABS: 650.0000 mg | ORAL_TABLET | Freq: Two times a day (BID) | ORAL | 0 refills | Status: AC

## 2021-07-17 MED ORDER — POTASSIUM CHLORIDE CRYS ER 20 MEQ PO TBCR
30.0000 meq | EXTENDED_RELEASE_TABLET | Freq: Once | ORAL | Status: AC
  Administered 2021-07-17: 30 meq via ORAL
  Filled 2021-07-17: qty 1

## 2021-07-17 MED ORDER — OXYCODONE HCL 5 MG PO TABS: 5.0000 mg | ORAL_TABLET | ORAL | 0 refills | Status: DC | PRN

## 2021-07-17 NOTE — Assessment & Plan Note (Signed)
Started on sodium bicarbonate 650 mg p.o. twice daily.  Recommend repeat BMP 1 week. ?

## 2021-07-17 NOTE — TOC Transition Note (Signed)
Transition of Care (TOC) - CM/SW Discharge Note ? ? ?Patient Details  ?Name: Tonya Thompson ?MRN: [id number] ?Date of Birth: 12/16/1955 ? ?[facility] of [facility]) CM/SW Contact:  ?Geralynn Ochs, LCSW ?Phone Number: ?07/17/2021, 11:37 AM ? ? ?Clinical Narrative:   CSW confirmed with MD plan to discharge to [facility], confirmed bed availability. CSW left voicemail for relative Abigail Butts. Patient saying she has an iPad and purse, but they are not in room; no other documentation of them. CSW left voicemail asking Abigail Butts if patient had belongings brought with her. Transport scheduled with [facility] for next available. ? ?Nurse to call report to [phone], [place]. ? ? ? ?Final next level of care: [place] ?Barriers to Discharge: Barriers Resolved ? ? ?Patient Goals and CMS Choice ?Patient states their goals for this hospitalization and ongoing recovery are:: get back to rehab ?CMS Medicare.gov Compare Post Acute Care list provided to:: Patient Represenative (must comment) ?Choice offered to / list presented to : Sibling ? ?Discharge Placement ?  ?           ?Patient chooses bed at: [facility] ?Patient to be transferred to facility by: [facility] ?Name of family member notified: Abigail Butts ?Patient and family notified of of transfer: 07/17/21 ? ?Discharge Plan and Services ?  ?  ?Post Acute Care Choice: [place]          ?  ?  ?  ?  ?  ?  ?  ?  ?  ?  ? ?Social Determinants of Health (SDOH) Interventions ?  ? ? ?Readmission Risk Interventions ?No flowsheet data found. ? ? ? ? ?

## 2021-07-17 NOTE — Progress Notes (Signed)
Pt d/c with all belongings which included a bag of clothes.  Irven [place], RN ? ?

## 2021-07-17 NOTE — Discharge Summary (Addendum)
?Physician Discharge Summary  ?Tonya Thompson RKY:[id number] DOB: 1956-04-13 DOA: 07/12/2021 ? ?PCP: Unk Pinto, MD ? ?Admit date: 07/12/2021 ?Discharge date: 07/17/2021 ? ?Admitted From: [facility] ?Disposition: [facility] ? ?Recommendations for Outpatient Follow-up:  ?Follow up with PCP in 1-2 weeks ?Follow-up with orthopedics, Dr. Lyla Glassing as scheduled ?We will need close follow-up with neurology after discharge ?Depakote discontinued per neurology recommendations ?Sodium bicarbonate started for metabolic acidosis ?Continue ciprofloxacin 500 mg p.o. twice daily to complete 5-day course for UTI ?Please obtain BMP in one week ? ?Discharge Condition: Stable ?CODE STATUS: DNR ?Diet recommendation:  ?Dysphagia 2 (fine chop);Thin liquid ?Liquids provided via: Straw;Cup ?Medication Administration: Whole meds with puree ?Supervision: Staff to assist with self feeding;Full supervision/cueing for compensatory strategies ?Compensations: Minimize environmental distractions;Slow rate;Small sips/bites;Lingual sweep for clearance of pocketing ?Postural Changes and/or Swallow Maneuvers: Seated upright 90 degrees ? ?History of present illness: ? ?Tonya Thompson is a 66 year old female with past medical history significant for cerebral AVMs, history of CVA x5 with resulting right-sided hemiparesis, history of seizure, CKD stage IIIb, urolithiasis, GERD, HLD and recent right femur fracture s/p intramedullary fixation who presented to [facility] for altered mental status.  Patient was just discharged from the hospital following admission for hip fracture.  After arriving at the facility, staff noted that she seemed more confused than her reported baseline; and was immediately sent back to the ED for further evaluation. ? ?In the ED, temperature 97.5 ?F, HR 103, RR 18, BP 124/93, SPO2 98% on room air. WBC 11.3, hemoglobin 9.9, platelets 95.  Sodium 133, potassium 2.6, chloride 109, CO2 18,  glucose 93, BUN 31, creatinine 0.92.  AST 31, ALT 10.  Total bilirubin 0.8.  Urinalysis with large leukocytes, negative nitrite, few bacteria, 11-20 WBCs.  EtOH level less than 10.  CT head without contrast with no acute intracranial pathology, extensive encephalomalacia of the left cerebral hemisphere with ex vacuo dilation of the left lateral ventricle.  Chest x-ray with no acute cardiopulmonary disease process.  MR brain without contrast with no evidence for acute or subacute infarct, large area of encephalomalacia involving the left cerebral hemisphere, limited due to inability to tolerate full length of the study.  EDP discussed with on-call neurologist who recommended transfer to [facility] for EEG for concern of seizure.  Hospital service was consulted and patient was transferred to [facility] for further evaluation and management. ? ? ?Hospital course: ? ?Assessment and Plan: ?* Acute metabolic encephalopathy ?Patient presenting to the ED from SNF with altered mental status, confusion after recent hospitalization for hip fracture.  Patient with history of multiple CVAs, seizure disorder, AVMs of the cerebrum.  CT head and MRI brain with no acute intracranial findings.  Chest x-ray negative for acute cardiopulmonary disease process.  Urinalysis unrevealing.  High suspicion for possible recurrent seizure.  Also consideration for elevated ammonia level during previous hospitalization, was 76 on 07/10/2021.  Evaluated by speech therapy with recommendations of dysphagia 2 diet.  Mentation now improved and close to her typical baseline. ? ?Seizure disorder ([place]) ?Apparently patient has been seizure-free since around 2000.  EEG with evidence of cortical dysfunction arising from the left temporal region likely secondary to underlying encephalomalacia, moderate diffuse encephalopathy that is nonspecific and no seizures or epileptiform discharges were noted during recording.  Neurology discontinued Keppra;  also Depakote was discontinued due to elevated ammonia level.  Neurology now signed off with recommendations to follow-up with her  primary neurologist, Dr. Delice Lesch.  Continue Topamax 300 mg PO BID ? ?Right hemiparesis ([place]) ?Continues with resultant right-sided hemiparesis from multiple CVAs in the past.  Discharging back to SNF. ? ?Closed intertrochanteric fracture of right femur ([place]) ?Patient with recent right intratrochanteric hip fracture. Underwent IM nail by Dr. Lyla Glassing, on 07/09/2021.  Continue weightbearing as tolerates with walker.  Continue Lovenox for DVT prophylaxis per orthopedics.  Outpatient follow-up with Dr. Lyla Glassing as scheduled. ? ?Hyponatremia ?Patient presenting with a sodium of 131, likely secondary to prerenal azotemia/dehydration.  Received IV fluid hydration with improvement.  Etiology likely secondary to poor oral intake.  Improved with IV fluid hydration.  Sodium level 135 at time of discharge.  Recommend repeat BMP 1 week. ? ?Hypokalemia ?Repleted during hospitalization.  Potassium 3.7 at time of discharge.  Recommend repeat BMP 1 week. ? ?Hyperammonemia ([place]) ?Ammonia level was noted to be elevated on 07/10/2021, 76.  Neurology discontinued Depakote as this could be playing a factor in its elevation. Repeat ammonia decreased to 46 07/14/2021 ? ?Hypomagnesemia ?Repleted during hospitalization. ? ?UTI (urinary tract infection) ?Urinalysis with large leukocytes, negative nitrite, few bacteria, 11-20 WBCs.  Urine culture positive for E. coli and Enterobacter cloacae .  Continue ciprofloxacin 500 mg p.o. twice daily; 5-day course based on culture susceptibilities. ? ? ?Metabolic acidosis, normal anion gap (NAG) ?Started on sodium bicarbonate 650 mg p.o. twice daily.  Recommend repeat BMP 1 week. ? ? ? ? ? ? ?Discharge Diagnoses:  ?Principal Problem: ?  Acute metabolic encephalopathy ?Active Problems: ?  Seizure disorder ([place]) ?  Right hemiparesis ([place]) ?  Closed intertrochanteric fracture of  right femur ([place]) ?  Hyponatremia ?  Hypokalemia ?  Hyperammonemia ([place]) ?  Metabolic acidosis, normal anion gap (NAG) ?  UTI (urinary tract infection) ?  Hypomagnesemia ? ? ? ?Discharge Instructions ? ?Discharge Instructions   ? ? Call MD for:  difficulty breathing, headache or visual disturbances   Complete by: As directed ?  ? Call MD for:  extreme fatigue   Complete by: As directed ?  ? Call MD for:  persistant dizziness or light-headedness   Complete by: As directed ?  ? Call MD for:  persistant nausea and vomiting   Complete by: As directed ?  ? Call MD for:  severe uncontrolled pain   Complete by: As directed ?  ? Call MD for:  temperature >100.4   Complete by: As directed ?  ? Diet - low sodium heart healthy   Complete by: As directed ?  ? Increase activity slowly   Complete by: As directed ?  ? No wound care   Complete by: As directed ?  ? ?  ? ?Allergies as of 07/17/2021   ? ?   Reactions  ? Aspirin Other (See Comments)  ? CONTRAINDICATED DUE TO HISTORY OF BLEEDING IN BRAIN  ? Cheese   ? Cant take due to history of severe migraines  ? Chocolate   ? Cant take due to history of severe migraines  ? Coffee Flavor   ? Cant take due to history of severe migraines  ? Other   ?  Nuts - Cant take due to history of severe migraines  ? Red Wine Complex [germanium]   ? Cant take due to history of severe migraines  ? ?  ? ?  ?Medication List  ?  ? ?STOP taking these medications   ? ?baclofen 10 MG tablet ?Commonly known as: LIORESAL ?  ?divalproex 500 MG  24 hr tablet ?Commonly known as: DEPAKOTE ER ?  ? ?  ? ?TAKE these medications   ? ?acetaminophen 325 MG tablet ?Commonly known as: TYLENOL ?Take 650 mg by mouth every 4 (four) hours as needed for headache or mild pain. ?  ?Calcium Carbonate 500 MG Chew ?Chew 500 mg by mouth daily. ?  ?Calcium Citrate-Vitamin D 315-5 MG-MCG Tabs ?Take 1 tablet by mouth daily. ?  ?ciprofloxacin 500 MG tablet ?Commonly known as: CIPRO ?Take 1 tablet (500 mg total) by mouth 2 (two) times  daily for 4 days. ?  ?DHA NATURAL OMEGA-3 PO ?Take 1 capsule by mouth daily. Omega-3 DHA (DR/EC) '350mg'$ -'325mg'$ -'90mg'$ -'597mg'$  ?  ?enoxaparin 30 MG/0.3ML injection ?Commonly known as: LOVENOX ?Inject 0.3 mLs (30 mg

## 2021-07-20 ENCOUNTER — Telehealth: Payer: Self-pay | Admitting: Neurology

## 2021-07-20 ENCOUNTER — Encounter: Payer: Self-pay | Admitting: Nurse Practitioner

## 2021-07-20 ENCOUNTER — Non-Acute Institutional Stay (SKILLED_NURSING_FACILITY): Payer: Medicare Other | Admitting: Nurse Practitioner

## 2021-07-20 DIAGNOSIS — I2510 Atherosclerotic heart disease of native coronary artery without angina pectoris: Secondary | ICD-10-CM

## 2021-07-20 DIAGNOSIS — R0989 Other specified symptoms and signs involving the circulatory and respiratory systems: Secondary | ICD-10-CM | POA: Diagnosis not present

## 2021-07-20 DIAGNOSIS — S72141A Displaced intertrochanteric fracture of right femur, initial encounter for closed fracture: Secondary | ICD-10-CM

## 2021-07-20 DIAGNOSIS — K5901 Slow transit constipation: Secondary | ICD-10-CM

## 2021-07-20 DIAGNOSIS — D500 Iron deficiency anemia secondary to blood loss (chronic): Secondary | ICD-10-CM | POA: Diagnosis not present

## 2021-07-20 DIAGNOSIS — R1310 Dysphagia, unspecified: Secondary | ICD-10-CM

## 2021-07-20 DIAGNOSIS — N1832 Chronic kidney disease, stage 3b: Secondary | ICD-10-CM

## 2021-07-20 DIAGNOSIS — N390 Urinary tract infection, site not specified: Secondary | ICD-10-CM | POA: Diagnosis not present

## 2021-07-20 DIAGNOSIS — E876 Hypokalemia: Secondary | ICD-10-CM | POA: Diagnosis not present

## 2021-07-20 DIAGNOSIS — E782 Mixed hyperlipidemia: Secondary | ICD-10-CM | POA: Diagnosis not present

## 2021-07-20 DIAGNOSIS — R918 Other nonspecific abnormal finding of lung field: Secondary | ICD-10-CM

## 2021-07-20 DIAGNOSIS — E872 Acidosis, unspecified: Secondary | ICD-10-CM

## 2021-07-20 DIAGNOSIS — K219 Gastro-esophageal reflux disease without esophagitis: Secondary | ICD-10-CM | POA: Insufficient documentation

## 2021-07-20 DIAGNOSIS — G43909 Migraine, unspecified, not intractable, without status migrainosus: Secondary | ICD-10-CM

## 2021-07-20 DIAGNOSIS — R269 Unspecified abnormalities of gait and mobility: Secondary | ICD-10-CM

## 2021-07-20 DIAGNOSIS — G8191 Hemiplegia, unspecified affecting right dominant side: Secondary | ICD-10-CM

## 2021-07-20 DIAGNOSIS — I2584 Coronary atherosclerosis due to calcified coronary lesion: Secondary | ICD-10-CM

## 2021-07-20 DIAGNOSIS — R4189 Other symptoms and signs involving cognitive functions and awareness: Secondary | ICD-10-CM

## 2021-07-20 DIAGNOSIS — G40909 Epilepsy, unspecified, not intractable, without status epilepticus: Secondary | ICD-10-CM

## 2021-07-20 DIAGNOSIS — E7220 Disorder of urea cycle metabolism, unspecified: Secondary | ICD-10-CM

## 2021-07-20 NOTE — Assessment & Plan Note (Signed)
f/u Neuro, last active seizure 1998, off Depakote 2/2 elevated ammonia level, off Keppra,  on Topamax ?

## 2021-07-20 NOTE — Assessment & Plan Note (Signed)
f/u Pulmonology, R upper lobe nodule, had bronchoscopy 04/21/21-negative for malignancy or infection.  ?

## 2021-07-20 NOTE — Assessment & Plan Note (Signed)
as an result of a hemorrhagic stroke at age of 36 and subsequent strokes x5 per patient.  ?

## 2021-07-20 NOTE — Assessment & Plan Note (Signed)
Supplemented, repeat Mg level.  ?

## 2021-07-20 NOTE — Assessment & Plan Note (Signed)
prior to R hip fx the patient ambulated with walker, R ankle brace.  ?

## 2021-07-20 NOTE — Assessment & Plan Note (Signed)
post Op, Hgb 9.9 in hospital. Update CBC/diff.  ?

## 2021-07-20 NOTE — Assessment & Plan Note (Signed)
no angina.  ?

## 2021-07-20 NOTE — Assessment & Plan Note (Signed)
c/o epigastric pain, feels like to burp, denied nausea, vomiting, or indigestion. Will try Omeprazole '20mg'$  qd.  ?

## 2021-07-20 NOTE — Assessment & Plan Note (Signed)
AST/ALT 31/10 07/12/21, ammonia 46 07/14/21. Repeat ammonia level.  ?

## 2021-07-20 NOTE — Assessment & Plan Note (Signed)
on diet, Fish Oil, LDL 101 04/03/21 ?

## 2021-07-20 NOTE — Assessment & Plan Note (Signed)
no flare ups since 2017, Tylenol is effected, treated with steroids in the past.  ?

## 2021-07-20 NOTE — Assessment & Plan Note (Signed)
,   thin liquid.  ?

## 2021-07-20 NOTE — Progress Notes (Signed)
?Location:   SNF [facility] ?Nursing Home Room Number: 40 ?Place of Service:  SNF (31) ?Provider: Marlana Latus NP ? ?Unk Pinto, MD ? ?Patient Care Team: ?Unk Pinto, MD as PCP - General (Internal Medicine) ?Werner Lean, MD as PCP - Cardiology (Cardiology) ?Cameron Sprang, MD as Consulting Physician (Neurology) ?Corliss Parish, MD as Consulting Physician (Nephrology) ?Raynelle Bring, MD as Consulting Physician (Urology) ? ?Extended Emergency Contact Information ?Primary Emergency Contact: Weeks,Wendy ? [place] ?Home Phone: [phone] ?Mobile Phone: [phone] ?Relation: Relative ?Secondary Emergency Contact: Roper,Paula ?Home Phone: [phone] ?Mobile Phone: [phone] ?Relation: Sister ? ?Code Status: DNR ?Goals of care: Advanced Directive information ?Advanced Directives 07/12/2021  ?Does Patient Have a Medical Advance Directive? Yes  ?Type of Paramedic of [place];Living will  ?Does patient want to make changes to medical advance directive? -  ?Copy of Reader in Chart? No - copy requested  ? ? ? ?Chief Complaint  ?Patient presents with  ? Acute Visit  ?  F/u hospital stay, constipation.  ? ? ?HPI:  ?Pt is a 66 y.o. female seen today for an acute visit for constipation, medication review following hospital stay  ? No BM about 4-5 days, abd bloated and discomfort palpated, BS present. Also c/o epigastric pain, feels like to burp, denied nausea, vomiting, or indigestion. Takes Senokot I bid, prn MiraLax ? Hospitalized 07/12/21-07/17/21 s/p R hip ORIF 07/09/21, on Lovenox, f/u Dr. Lyla Glassing. The patient returned to ED due to AMS upon arrival at [facility]. The patient was found wbc 11.3, Hgb 9.9, Na 133, K 2.46, and UTI. CT/MRI head/brain no acute process. EEG no seizure or epileptiform discharges ? R hip pain, surgical site is cover in dry intact dressing, mild swelling in the area, no significant redness or warmth, takes Tylenol,  Oxycodone for pain ? UTI, completing 5 days of Cipro at SNF [facility] ? Dysphagia 2, thin liquid.  ? Anemia, post Op, Hgb 9.9 in hospital.  ? Hypokalemia, K 3.7 in hospital 07/17/21 ? Hypomagnesemia, supplemented, ? Metabolic acidosis, on NaHCo3 ?  CAD, no angina.  ?            HTN controlled w/o meds.  ?            HLD on diet, Fish Oil, LDL 101 04/03/21 ?            Elevated LFT AST/ALT 31/10 07/12/21, ammonia 46 07/14/21 ?            Impaired cognition, difficulty word findings/forming sentences. 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  TSH 3.039 07/15/21 ?Migraines, no flare ups since 2017, Tylenol is effected, treated with steroids in the past.  ?            Pulmonary nodules, f/u Pulmonology, R upper lobe nodule, had bronchoscopy 04/21/21-negative for malignancy or infection.  ?            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            R hemiparesis, as an result of a hemorrhagic stroke at age of 47 and subsequent strokes x5 per patient.  ? Hx of cerebral AVMs ?            CKD III, Bun/creat 18/0.76 07/17/21 ?            Gait abnormality, prior to R hip fx  the patient ambulated with walker, R ankle brace.  ?  ?  ?Past Medical History:  ?Diagnosis Date  ? Acute kidney injury ([place]) 09/03/2019  ? 05-2019 kidney stone removed  ? Allergy   ? seasonal allergies  ? Arthritis   ? LEFT hand  ? AVM (arteriovenous malformation) brain   ? Cataract   ? bilateral-not a surgical candidate at this time (07/23/2020)  ? COVID-19 virus infection 08/2020  ? GERD (gastroesophageal reflux disease)   ? on meds  ? Hyperlipidemia   ? diet controlled  ? Insulin resistance 02/16/2018  ? Seizures ([place])   ? 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 Stroke [facility])   ? 5 total so far (age [phone])  ? Ureteral stone 05/10/2019  ? has multiple kidney stones noted from Urologist  ? Uses roller walker   ? ?Past  Surgical History:  ?Procedure Laterality Date  ? BRAIN SURGERY  09/19/1978  ? at [facility], Dr. Leida Lauth  ? BRONCHIAL BIOPSY  04/21/2021  ? Procedure: BRONCHIAL BIOPSIES;  Surgeon: Garner Nash, DO;  Location: [facility];  Service: Pulmonary;;  ? BRONCHIAL BRUSHINGS  04/21/2021  ? Procedure: BRONCHIAL BRUSHINGS;  Surgeon: Garner Nash, DO;  Location: [facility];  Service: Pulmonary;;  ? BRONCHIAL WASHINGS  04/21/2021  ? Procedure: BRONCHIAL WASHINGS;  Surgeon: Garner Nash, DO;  Location: [facility];  Service: Pulmonary;;  ? COLONOSCOPY  2017  ? [place], [place]  ? CYSTOSCOPY/URETEROSCOPY/HOLMIUM LASER/STENT PLACEMENT Left 05/10/2019  ? Procedure: FTDDUKGURK/YHCWCBJSEG/BTDVVOHYWVPX/TGGYIRS LASER/STENT PLACEMENT;  Surgeon: Raynelle Bring, MD;  Location: [facility];  Service: Urology;  Laterality: Left;  ? FIDUCIAL MARKER PLACEMENT  04/21/2021  ? Procedure: FIDUCIAL MARKER PLACEMENT;  Surgeon: Garner Nash, DO;  Location: [facility];  Service: Pulmonary;;  ? FOOT SURGERY Right 10/1985  ? ankle fusion, at [facility], Dr. Lorelee Cover  ? INTRAMEDULLARY (IM) NAIL INTERTROCHANTERIC Right 07/09/2021  ? Procedure: INTRAMEDULLARY (IM) NAIL INTERTROCHANTRIC;  Surgeon: Rod Can, MD;  Location: [facility];  Service: Orthopedics;  Laterality: Right;  ? POLYPECTOMY  2017  ? multiple adenomas  ? TUBAL LIGATION  1981  ? VIDEO BRONCHOSCOPY WITH RADIAL ENDOBRONCHIAL ULTRASOUND  04/21/2021  ? Procedure: RADIAL ENDOBRONCHIAL ULTRASOUND;  Surgeon: Garner Nash, DO;  Location: [facility];  Service: Pulmonary;;  ? WISDOM TOOTH EXTRACTION    ? ? ?Allergies  ?Allergen Reactions  ? Aspirin Other (See Comments)  ?  CONTRAINDICATED DUE TO HISTORY OF BLEEDING IN BRAIN  ? Cheese   ?  Cant take due to history of severe migraines  ? Chocolate   ?  Cant take due to history of severe migraines  ? Coffee Flavor   ?  Cant take due to history of severe migraines  ? Other   ?   Nuts - Cant take due to history of severe migraines  ? Red Wine Complex  [Germanium]   ?  Cant take due to history of severe migraines  ? ? ?Allergies as of 07/20/2021   ? ?   Reactions  ? Aspirin Other (See Comments)  ? CONTRAINDICATED DUE TO HISTORY OF BLEEDING IN BRAIN  ? Cheese   ? Cant take due to history of severe migraines  ? Chocolate   ? Cant take due to history of severe migraines  ? Coffee Flavor   ? Cant take due to history of severe migraines  ? Other   ?  Nuts - Cant take due to history of severe migraines  ? Red  Wine Complex [germanium]   ? Cant take due to history of severe migraines  ? ?  ? ?  ?Medication List  ?  ? ?  ? Accurate as of July 20, 2021 11:59 PM. If you have any questions, ask your nurse or doctor.  ?  ?  ? ?  ? ?acetaminophen 325 MG tablet ?Commonly known as: TYLENOL ?Take 650 mg by mouth every 4 (four) hours as needed for headache or mild pain. ?  ?Calcium Carbonate 500 MG Chew ?Chew 500 mg by mouth daily. ?  ?Calcium Citrate-Vitamin D 315-5 MG-MCG Tabs ?Take 1 tablet by mouth daily. ?  ?ciprofloxacin 500 MG tablet ?Commonly known as: CIPRO ?Take 1 tablet (500 mg total) by mouth 2 (two) times daily for 4 days. ?  ?DHA NATURAL OMEGA-3 PO ?Take 1 capsule by mouth daily. Omega-3 DHA (DR/EC) '350mg'$ -'325mg'$ -'90mg'$ -'597mg'$  ?  ?enoxaparin 30 MG/0.3ML injection ?Commonly known as: LOVENOX ?Inject 0.3 mLs (30 mg total) into the skin daily. ?  ?fexofenadine 180 MG tablet ?Commonly known as: ALLEGRA ?Take 180 mg by mouth daily. ?  ?guaiFENesin 600 MG 12 hr tablet ?Commonly known as: [place] ?Take 600 mg by mouth 2 (two) times daily as needed for cough. ?  ?nystatin powder ?Generic drug: nystatin ?Apply 1 application. topically 2 (two) times daily as needed (rash under breast). ?  ?oxyCODONE 5 MG immediate release tablet ?Commonly known as: Oxy IR/ROXICODONE ?Take 1 tablet (5 mg total) by mouth every 4 (four) hours as needed for breakthrough pain ((for MODERATE breakthrough pain)). ?  ?polyethylene glycol 17 g packet ?Commonly known as: MIRALAX / GLYCOLAX ?Take 17 g by mouth  daily as needed for mild constipation. ?  ?senna 8.6 MG Tabs tablet ?Commonly known as: SENOKOT ?Take 1 tablet (8.6 mg total) by mouth 2 (two) times daily. ?  ?sodium bicarbonate 650 MG tablet ?Take 1

## 2021-07-20 NOTE — Assessment & Plan Note (Signed)
completing 5 days of Cipro at SNF [facility] ?

## 2021-07-20 NOTE — Assessment & Plan Note (Signed)
Bun/creat 18/0.76 07/17/21 ?

## 2021-07-20 NOTE — Assessment & Plan Note (Signed)
s/p R hip ORIF 07/09/21, on Lovenox, f/u Dr. Lyla Glassing. R hip pain, surgical site is cover in dry intact dressing, mild swelling in the area, no significant redness or warmth, takes Tylenol, Oxycodone for pain ?

## 2021-07-20 NOTE — Assessment & Plan Note (Signed)
on NaHCo3, repeat BMP ?

## 2021-07-20 NOTE — Assessment & Plan Note (Signed)
difficulty word findings/forming sentences. 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  TSH 3.039 07/15/21 ?

## 2021-07-20 NOTE — Assessment & Plan Note (Signed)
Supplemented, K 3.7 in hospital 07/17/21, update CMP/eGFR ?

## 2021-07-20 NOTE — Telephone Encounter (Signed)
Tonya Thompson from [facility] called, pt needs a follow up appt following her ER visit. Patient has an appt 09/04/21 for a regular follow up. Needs to know if she needs to be seen sooner than that.  ?Tonya Thompson # [id number] ext [phone] ?

## 2021-07-20 NOTE — Assessment & Plan Note (Signed)
Blood pressure is controlled w/o meds.  ?

## 2021-07-20 NOTE — Assessment & Plan Note (Signed)
No BM about 4-5 days, abd bloated and discomfort palpated, BS present. Increase Senokot to II bid, continue MiraLax as needed. MOM 50m x1 stat ?

## 2021-07-21 ENCOUNTER — Encounter: Payer: Self-pay | Admitting: Internal Medicine

## 2021-07-21 ENCOUNTER — Non-Acute Institutional Stay (SKILLED_NURSING_FACILITY): Payer: Medicare Other | Admitting: Internal Medicine

## 2021-07-21 ENCOUNTER — Ambulatory Visit: Payer: Medicare Other | Admitting: Neurology

## 2021-07-21 DIAGNOSIS — E782 Mixed hyperlipidemia: Secondary | ICD-10-CM

## 2021-07-21 DIAGNOSIS — K5901 Slow transit constipation: Secondary | ICD-10-CM | POA: Diagnosis not present

## 2021-07-21 DIAGNOSIS — R4189 Other symptoms and signs involving cognitive functions and awareness: Secondary | ICD-10-CM | POA: Diagnosis not present

## 2021-07-21 DIAGNOSIS — R1310 Dysphagia, unspecified: Secondary | ICD-10-CM | POA: Diagnosis not present

## 2021-07-21 DIAGNOSIS — N390 Urinary tract infection, site not specified: Secondary | ICD-10-CM | POA: Diagnosis not present

## 2021-07-21 DIAGNOSIS — D500 Iron deficiency anemia secondary to blood loss (chronic): Secondary | ICD-10-CM | POA: Diagnosis not present

## 2021-07-21 DIAGNOSIS — G40909 Epilepsy, unspecified, not intractable, without status epilepticus: Secondary | ICD-10-CM | POA: Diagnosis not present

## 2021-07-21 DIAGNOSIS — Z9889 Other specified postprocedural states: Secondary | ICD-10-CM | POA: Diagnosis not present

## 2021-07-21 DIAGNOSIS — K219 Gastro-esophageal reflux disease without esophagitis: Secondary | ICD-10-CM | POA: Diagnosis not present

## 2021-07-21 DIAGNOSIS — R410 Disorientation, unspecified: Secondary | ICD-10-CM | POA: Diagnosis not present

## 2021-07-21 DIAGNOSIS — E7220 Disorder of urea cycle metabolism, unspecified: Secondary | ICD-10-CM | POA: Diagnosis not present

## 2021-07-21 DIAGNOSIS — G43909 Migraine, unspecified, not intractable, without status migrainosus: Secondary | ICD-10-CM

## 2021-07-21 DIAGNOSIS — M25572 Pain in left ankle and joints of left foot: Secondary | ICD-10-CM | POA: Diagnosis not present

## 2021-07-21 NOTE — Telephone Encounter (Signed)
I have an opening tomorrow at 1:30pm if they can bring her, thanks ?

## 2021-07-21 NOTE — Progress Notes (Signed)
?Provider:   ?Location:  [facility] ?Nursing Home Room Number: 40 ?Place of Service:  SNF (31) ? ?PCP: Virgie Dad, MD ?Patient Care Team: ?Virgie Dad, MD as PCP - General (Internal Medicine) ?Werner Lean, MD as PCP - Cardiology (Cardiology) ?Cameron Sprang, MD as Consulting Physician (Neurology) ?Corliss Parish, MD as Consulting Physician (Nephrology) ?Raynelle Bring, MD as Consulting Physician (Urology) ? ?Extended Emergency Contact Information ?Primary Emergency Contact: Weeks,Wendy ? [place] ?Home Phone: [phone] ?Mobile Phone: [phone] ?Relation: Relative ?Secondary Emergency Contact: Roper,Paula ?Home Phone: [phone] ?Mobile Phone: [phone] ?Relation: Sister ? ?Code Status: Full code ?Goals of Care: Advanced Directive information ?Advanced Directives 07/21/2021  ?Does Patient Have a Medical Advance Directive? No  ?Type of Advance Directive -  ?Does patient want to make changes to medical advance directive? No - Patient declined  ?Copy of [place] in Chart? -  ? ? ? ? ?Chief Complaint  ?Patient presents with  ? New Admit To SNF  ?  Admission to SNF  ? ? ?HPI: Patient is a 66 y.o. female seen today for admission to SNF ? ?Admitted in the hospital from 3/8 with 3/12 for right femur fracture ?Readmitted from 03/12 to 03/17 for acute metabolic encephalopathy ? 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  ?H/o Migraines and Headaches ?H/o Strokes With Right Spasticity ?Labile Hypertension ?H/o Lung Nodule ? ?Patient had a fall in assisted living.  Sustained right femur fracture underwent operative intervention on 03/09 by Dr. Lyla Glassing ?Patient was sent to ED again for change in mental status ?? Etiology CT and MRI was negative ?EEG did not show any seizures ?Neurology discontinued her Depakote as her Ammonia level was high ?  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 ?on Topamax now only ? ?Mental status at baseline ?Only c/o Pain in her Left ankle ?Still has not walked with the therapy ?Max assist and Harrel Lemon for transfers ?Did have issue with Constipation but now better. ? ? ?Past Medical History:  ?Diagnosis Date  ? Acute kidney injury ([place]) 09/03/2019  ? 05-2019 kidney stone removed  ? Allergy   ? seasonal allergies  ? Arthritis   ? LEFT hand  ? AVM (arteriovenous malformation) brain   ? Cataract   ? bilateral-not a surgical candidate at this time (07/23/2020)  ? COVID-19 virus infection 08/2020  ? GERD (gastroesophageal reflux disease)   ? on meds  ? Hyperlipidemia   ? diet controlled  ? Insulin resistance 02/16/2018  ? Seizures ([place])   ? 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 Stroke [facility])   ? 5 total so far (age [phone])  ? Ureteral stone 05/10/2019  ? has multiple kidney stones noted from Urologist  ? Uses roller walker   ? ?Past Surgical History:  ?Procedure Laterality Date  ? BRAIN SURGERY  09/19/1978  ? at [facility], Dr. Leida Lauth  ? BRONCHIAL BIOPSY  04/21/2021  ? Procedure: BRONCHIAL BIOPSIES;  Surgeon: Garner Nash, DO;  Location: [place] ENDOSCOPY;  Service: Pulmonary;;  ? BRONCHIAL BRUSHINGS  04/21/2021  ? Procedure: BRONCHIAL BRUSHINGS;  Surgeon: Garner Nash, DO;  Location: [place] [facility]OSCOPY;  Service: Pulmonary;;  ? BRONCHIAL WASHINGS  04/21/2021  ? Procedure: BRONCHIAL WASHINGS;  Surgeon: Garner Nash, DO;  Location: [place] ENDOSCOPY;  Service: Pulmonary;;  ? COLONOSCOPY  2017  ? [place], [place]  ? CYSTOSCOPY/URETEROSCOPY/HOLMIUM LASER/STENT PLACEMENT Left 05/10/2019  ? Procedure: FGHWEXHBZJ/IRCVELFYBO/FBPZWCHENIDP/OEUMPNT LASER/STENT PLACEMENT;  Surgeon: Raynelle Bring, MD;  [facility];  Service: Urology;  Laterality: Left;  ? FIDUCIAL MARKER PLACEMENT  04/21/2021  ? Procedure: FIDUCIAL MARKER PLACEMENT;  Surgeon: Garner Nash, DO;  Location: [place] ENDOSCOPY;  Service:  Pulmonary;;  ? FOOT SURGERY Right 10/1985  ? ankle fusion, at [facility], Dr. Lorelee Cover  ? INTRAMEDULLARY (IM) NAIL INTERTROCHANTERIC Right 07/09/2021  ? Procedure: INTRAMEDULLARY (IM) NAIL INTERTROCHANTRIC;  Surgeon: Rod Can, MD;  [facility];  Service: Orthopedics;  Laterality: Right;  ? POLYPECTOMY  2017  ? multiple adenomas  ? TUBAL LIGATION  1981  ? VIDEO BRONCHOSCOPY WITH RADIAL ENDOBRONCHIAL ULTRASOUND  04/21/2021  ? Procedure: RADIAL ENDOBRONCHIAL ULTRASOUND;  Surgeon: Garner Nash, DO;  Location: [place] ENDOSCOPY;  Service: Pulmonary;;  ? WISDOM TOOTH EXTRACTION    ? ? reports that she has never smoked. She has been exposed to tobacco smoke. She has never used smokeless tobacco. She reports that she does not drink alcohol and does not use drugs. ?Social History  ? ?Socioeconomic History  ? Marital status: Single  ?  Spouse name: Not on file  ? Number of children: Not on file  ? Years of education: Not on file  ? Highest education level: Not on file  ?Occupational History  ? Not on file  ?Tobacco Use  ? Smoking status: Never  ?  Passive exposure: Past  ? Smokeless tobacco: Never  ?Vaping Use  ? Vaping Use: Never used  ?Substance and Sexual Activity  ? Alcohol use: No  ? Drug use: No  ? Sexual activity: Not Currently  ?  Birth control/protection: None  ?  Comment: 1st intercourse 66 yo-Fewer than 5 partners  ?Other Topics Concern  ? Not on file  ?Social History Narrative  ? Right handed until 3rd grade   ? Left handed  ? 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 ?Social Determinants of Health  ? ?Financial Resource Strain: Not on file  ?Food Insecurity: Not on file  ?Transportation Needs: Not on file  ?Physical Activity: Not on file  ?Stress: Not on file  ?Social Connections: Not on file  ?Intimate Partner Violence: Not on file  ? ? ?Functional Status Survey: ?  ? ?Family History  ?Problem Relation Age of Onset  ? Heart disease Mother   ? Heart failure Mother   ? Heart attack Mother   ? Diabetes Father   ?  Hyperlipidemia Father   ? Dementia Father   ? Prostate cancer Father   ? Melanoma Father   ? Colon polyps Father   ? Hyperlipidemia Paternal Uncle   ? Hypertension Paternal Uncle   ? Dementia Paternal Uncle   ? Heart attack Maternal Grandfather   ? Stroke Paternal Grandmother   ? CAD Maternal Uncle   ? COPD Maternal Uncle   ?     smoker  ? Breast cancer Neg Hx   ? Colon cancer Neg Hx   ? Esophageal cancer Neg Hx   ? Stomach cancer Neg Hx   ? Rectal cancer Neg Hx   ? ? ?Health Maintenance  ?Topic Date Due  ? Zoster Vaccines- Shingrix (1 of 2) Never done  ? PAP SMEAR-Modifier  12/06/2020  ? COVID-19 Vaccine (5 - Booster for [place] series) 10/01/2021 (Originally 01/05/2021)  ? MAMMOGRAM  08/28/2022  ? COLONOSCOPY (Pts 45-4yr Insurance coverage will need to be confirmed)  01/09/2024  ? TETANUS/TDAP  10/18/2029  ? INFLUENZA VACCINE  Completed  ? HPV VACCINES  Aged Out  ? Hepatitis C Screening  Discontinued  ? HIV Screening  Discontinued  ? ? ?Allergies  ?Allergen Reactions  ? Aspirin Other (See Comments)  ?  CONTRAINDICATED DUE TO HISTORY OF BLEEDING IN BRAIN  ? Cheese   ?  Cant take due to history of severe migraines  ? Chocolate   ?  Cant take due to history of severe migraines  ? Coffee Flavor   ?  Cant take due to history of severe migraines  ? Other   ?   Nuts - Cant take due to history of severe migraines  ? Red Wine Complex [Germanium]   ?  Cant take due to history of severe migraines  ? ? ?Outpatient Encounter Medications as of 07/21/2021  ?Medication Sig  ? acetaminophen (TYLENOL) 325 MG tablet Take 650 mg by mouth every 4 (four) hours as needed for headache or mild pain.  ? Calcium Carbonate 500 MG CHEW Chew 500 mg by mouth daily.  ? Calcium Citrate-Vitamin D 315-5 MG-MCG TABS Take 1 tablet by mouth daily.  ? ciprofloxacin (CIPRO) 500 MG tablet Take 1 tablet (500 mg total) by mouth 2 (two) times daily for 4 days.  ? Docosahexaenoic Acid (DHA NATURAL OMEGA-3 PO) Take 1 capsule by mouth daily. Omega-3 DHA (DR/EC)  '350mg'$ -'325mg'$ -'90mg'$ -'597mg'$   ? enoxaparin (LOVENOX) 30 MG/0.3ML injection Inject 0.3 mLs (30 mg total) into the skin daily.  ? fexofenadine (ALLEGRA) 180 MG tablet Take 180 mg by mouth daily.  ? guaiFENesin ([place])

## 2021-07-23 ENCOUNTER — Other Ambulatory Visit: Payer: Self-pay

## 2021-07-23 ENCOUNTER — Encounter: Payer: Self-pay | Admitting: Physician Assistant

## 2021-07-23 ENCOUNTER — Encounter: Payer: Self-pay | Admitting: Nurse Practitioner

## 2021-07-23 ENCOUNTER — Non-Acute Institutional Stay (INDEPENDENT_AMBULATORY_CARE_PROVIDER_SITE_OTHER): Payer: Medicare Other | Admitting: Nurse Practitioner

## 2021-07-23 ENCOUNTER — Telehealth: Payer: Self-pay

## 2021-07-23 ENCOUNTER — Ambulatory Visit (INDEPENDENT_AMBULATORY_CARE_PROVIDER_SITE_OTHER): Payer: Medicare Other | Admitting: Physician Assistant

## 2021-07-23 VITALS — BP 112/66 | HR 99 | Ht 65.0 in | Wt 147.0 lb

## 2021-07-23 DIAGNOSIS — E872 Acidosis, unspecified: Secondary | ICD-10-CM

## 2021-07-23 DIAGNOSIS — M549 Dorsalgia, unspecified: Secondary | ICD-10-CM | POA: Diagnosis not present

## 2021-07-23 DIAGNOSIS — I2584 Coronary atherosclerosis due to calcified coronary lesion: Secondary | ICD-10-CM | POA: Diagnosis not present

## 2021-07-23 DIAGNOSIS — Z8673 Personal history of transient ischemic attack (TIA), and cerebral infarction without residual deficits: Secondary | ICD-10-CM

## 2021-07-23 DIAGNOSIS — R0989 Other specified symptoms and signs involving the circulatory and respiratory systems: Secondary | ICD-10-CM | POA: Diagnosis not present

## 2021-07-23 DIAGNOSIS — D500 Iron deficiency anemia secondary to blood loss (chronic): Secondary | ICD-10-CM | POA: Diagnosis not present

## 2021-07-23 DIAGNOSIS — R4189 Other symptoms and signs involving cognitive functions and awareness: Secondary | ICD-10-CM | POA: Diagnosis not present

## 2021-07-23 DIAGNOSIS — K5901 Slow transit constipation: Secondary | ICD-10-CM

## 2021-07-23 DIAGNOSIS — R519 Headache, unspecified: Secondary | ICD-10-CM

## 2021-07-23 DIAGNOSIS — G40909 Epilepsy, unspecified, not intractable, without status epilepticus: Secondary | ICD-10-CM

## 2021-07-23 DIAGNOSIS — E782 Mixed hyperlipidemia: Secondary | ICD-10-CM | POA: Diagnosis not present

## 2021-07-23 DIAGNOSIS — R7989 Other specified abnormal findings of blood chemistry: Secondary | ICD-10-CM | POA: Diagnosis not present

## 2021-07-23 DIAGNOSIS — N1832 Chronic kidney disease, stage 3b: Secondary | ICD-10-CM | POA: Diagnosis not present

## 2021-07-23 DIAGNOSIS — R918 Other nonspecific abnormal finding of lung field: Secondary | ICD-10-CM

## 2021-07-23 DIAGNOSIS — I2510 Atherosclerotic heart disease of native coronary artery without angina pectoris: Secondary | ICD-10-CM

## 2021-07-23 DIAGNOSIS — K219 Gastro-esophageal reflux disease without esophagitis: Secondary | ICD-10-CM

## 2021-07-23 DIAGNOSIS — R1310 Dysphagia, unspecified: Secondary | ICD-10-CM

## 2021-07-23 DIAGNOSIS — R269 Unspecified abnormalities of gait and mobility: Secondary | ICD-10-CM

## 2021-07-23 DIAGNOSIS — S72141A Displaced intertrochanteric fracture of right femur, initial encounter for closed fracture: Secondary | ICD-10-CM

## 2021-07-23 DIAGNOSIS — E876 Hypokalemia: Secondary | ICD-10-CM

## 2021-07-23 NOTE — Patient Instructions (Signed)
Medication Instructions:  ?Your physician recommends that you continue on your current medications as directed. Please refer to the Current Medication list given to you today. ?*If you need a refill on your cardiac medications before your next appointment, please call your pharmacy* ? ?Lab Work: ?None ordered ? ?Testing/Procedures: ?None ordered  ? ?Follow-Up: ?At [facility], you and your health needs are our priority.  As part of our continuing mission to provide you with exceptional heart care, we have created designated Provider Care Teams.  These Care Teams include your primary Cardiologist (physician) and Advanced Practice Providers (APPs -  Physician Assistants and Nurse Practitioners) who all work together to provide you with the care you need, when you need it. ? ?We recommend signing up for the patient portal called "MyChart".  Sign up information is provided on this After Visit Summary.  MyChart is used to connect with patients for Virtual Visits (Telemedicine).  Patients are able to view lab/test results, encounter notes, upcoming appointments, etc.  Non-urgent messages can be sent to your provider as well.   ?To learn more about what you can do with MyChart, go to [email or web address].   ? ?Your next appointment:   ?11 month(s) as scheduled  ? ?The format for your next appointment:   ?In Person ? ?Provider:   ?Werner Lean, MD  ? ? ?Other Instructions ?  ?

## 2021-07-23 NOTE — Telephone Encounter (Signed)
Tried to reach patient to schedule a hospital follow up. Unable to leave a message, voicemail is full. ?

## 2021-07-23 NOTE — Progress Notes (Signed)
?Location:   [facility] ?Nursing Home Room Number: 40 B ?Place of Service:  SNF (31) ?Provider:  Nnamdi Dacus X, NP ? ?Virgie Dad, MD ? ?Patient Care Team: ?Virgie Dad, MD as PCP - General (Internal Medicine) ?Werner Lean, MD as PCP - Cardiology (Cardiology) ?Cameron Sprang, MD as Consulting Physician (Neurology) ?Corliss Parish, MD as Consulting Physician (Nephrology) ?Raynelle Bring, MD as Consulting Physician (Urology) ? ?Extended Emergency Contact Information ?Primary Emergency Contact: Weeks,Wendy ? [place] ?Home Phone: [phone] ?Mobile Phone: [phone] ?Relation: Relative ?Secondary Emergency Contact: Roper,Paula ?Home Phone: [phone] ?Mobile Phone: [phone] ?Relation: Sister ? ?Code Status:  DNR ?Goals of care: Advanced Directive information ? ?  07/23/2021  ?  1:53 PM  ?Advanced Directives  ?Does Patient Have a Medical Advance Directive? Yes  ?Type of Paramedic of [place];Living will;Out of facility DNR (pink MOST or yellow form)  ?Does patient want to make changes to medical advance directive? No - Patient declined  ?Copy of [place] in Chart? No - copy requested  ?Pre-existing out of facility DNR order (yellow form or pink MOST form) Yellow form placed in chart (order not valid for inpatient use);Pink MOST form placed in chart (order not valid for inpatient use)  ? ? ? ?Chief Complaint  ?Patient presents with  ? Follow-up  ?  Metabolic acidosis  ? ? ?HPI:  ?Pt is a 66 y.o. female seen today for an acute visit for f/u metabolic acidosis, taking NaHCo3 tabs, Na 140, K 3.4, Cl 109, CO2 24 07/23/21 ?  ?Constipation, stable, takes Senokot, MiraLax ?            Hospitalized 07/12/21-07/17/21 s/p R hip ORIF 07/09/21, on Lovenox, f/u Dr. Lyla Glassing.  ?            R hip pain, WBAT, on Tylenol, Oxycodone for pain ?            Dysphagia 2, thin liquid.  ? GERD, stable, on Omeprazole. ?            Anemia, post  Op, Hgb 10.3 07/21/21 ?            Hypokalemia, K 3.4 07/23/21 ?            Hypomagnesemia, supplemented, Mg 2.1 07/21/21 ?            Metabolic acidosis, off NaHCo3 07/23/21 ?             CAD, no angina. F/u Cariology.  ?            HTN controlled w/o meds.  ?            HLD on diet, Fish Oil, LDL 101 04/03/21 ?            Elevated LFT AST/ALT 31/10 07/12/21, ammonia 51 07/21/21 ?            Impaired cognition, difficulty word findings/forming sentences. 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  TSH 3.039 07/15/21 ?Migraines, no flare ups since 2017, Tylenol is effected, treated with steroids in the past.  ?            Pulmonary nodules, f/u Pulmonology, R upper lobe nodule, had bronchoscopy 04/21/21-negative for malignancy or infection.  ?            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  ?            Hx of cerebral AVMs ?            CKD III, Bun/creat 17/0.96 07/23/21 ?            Gait abnormality, prior to R hip fx the patient ambulated with walker, R ankle brace.  ? Anemia, Hgb 10.3 07/21/21 ?  ? ?Past Medical History:  ?Diagnosis Date  ? Acute kidney injury ([place]) 09/03/2019  ? 05-2019 kidney stone removed  ? Allergy   ? seasonal allergies  ? Arthritis   ? LEFT hand  ? AVM (arteriovenous malformation) brain   ? Cataract   ? bilateral-not a surgical candidate at this time (07/23/2020)  ? COVID-19 virus infection 08/2020  ? GERD (gastroesophageal reflux disease)   ? on meds  ? Hyperlipidemia   ? diet controlled  ? Insulin resistance 02/16/2018  ? Seizures ([place])   ? 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 Stroke [facility])   ? 5 total so far (age [phone])  ? Ureteral stone 05/10/2019  ? has multiple kidney stones noted from Urologist  ? Uses  roller walker   ? ?Past Surgical History:  ?Procedure Laterality Date  ? BRAIN SURGERY  09/19/1978  ? at [facility], Dr. Leida Lauth  ? BRONCHIAL BIOPSY  04/21/2021  ? Procedure: BRONCHIAL BIOPSIES;  Surgeon: Garner Nash, DO;  Location: [place] END[facility]COPY;  Service: Pulmonary;;  ? BRONCHIAL BRUSHINGS  04/21/2021  ? Procedure: BRONCHIAL BRUSHINGS;  Surgeon: Garner Nash, DO;  Location: [place] [facility]OSCOPY;  Service: Pulmonary;;  ? BRONCHIAL WASHINGS  04/21/2021  ? Procedure: BRONCHIAL WASHINGS;  Surgeon: Garner Nash, DO;  Location: [place] ENDOSCOPY;  Service: Pulmonary;;  ? COLONOSCOPY  2017  ? [place], [place]  ? CYSTOSCOPY/URETEROSCOPY/HOLMIUM LASER/STENT PLACEMENT Left 05/10/2019  ? Procedure: LFYBOFBPZW/CHENIDPOEU/MPNTIRWERXVQ/MGQQPYP LASER/STENT PLACEMENT;  Surgeon: Raynelle Bring, MD;  [facility];  Service: Urology;  Laterality: Left;  ? FIDUCIAL MARKER PLACEMENT  04/21/2021  ? Procedure: FIDUCIAL MARKER PLACEMENT;  Surgeon: Garner Nash, DO;  Location: [place] ENDOSCOPY;  Service: Pulmonary;;  ? FOOT SURGERY Right 10/1985  ? ankle fusion, at [facility], Dr. Lorelee Cover  ? INTRAMEDULLARY (IM) NAIL INTERTROCHANTERIC Right 07/09/2021  ? Procedure: INTRAMEDULLARY (IM) NAIL INTERTROCHANTRIC;  Surgeon: Rod Can, MD;  [facility];  Service: Orthopedics;  Laterality: Right;  ? POLYPECTOMY  2017  ? multiple adenomas  ? TUBAL LIGATION  1981  ? VIDEO BRONCHOSCOPY WITH RADIAL ENDOBRONCHIAL ULTRASOUND  04/21/2021  ? Procedure: RADIAL ENDOBRONCHIAL ULTRASOUND;  Surgeon: Garner Nash, DO;  Location: [place] ENDOSCOPY;  Service: Pulmonary;;  ? WISDOM TOOTH EXTRACTION    ? ? ?Allergies  ?Allergen Reactions  ? Aspirin Other (See Comments)  ?  CONTRAINDICATED DUE TO HISTORY OF BLEEDING IN BRAIN  ? Cheese   ?  Cant take due to history of severe migraines  ? Chocolate   ?  Cant take due to history of severe migraines  ? Coffee Flavor   ?  Cant take due to history of severe migraines  ? Other   ?   Nuts - Cant take due to history of severe  migraines  ? Red Wine Complex [Germanium]   ?  Cant take due to history of severe migraines  ? ? ?  Allergies as of 07/23/2021   ? ?   Reactions  ? Aspirin Other (See Comments)  ? CONTRAINDICATED DUE TO HISTORY OF BLEEDING IN BRAIN  ? Cheese   ? Cant take due to history of severe migraines  ? Chocolate   ? Cant take due to history of severe migraines  ? Coffee Flavor   ? Cant take due to history of severe migraines  ? Other   ?  Nuts - Cant take due to history of severe migraines  ? Red Wine Complex [germanium]   ? Cant take due to history of severe migraines  ? ?  ? ?  ?Medication List  ?  ? ?  ? Accurate as of July 23, 2021 11:59 PM. If you have any questions, ask your nurse or doctor.  ?  ?  ? ?  ? ?STOP taking these medications   ? ?Cipro 500 MG tablet ?Generic drug: ciprofloxacin ?Stopped by: Aneli Zara X Timiko Offutt, NP ?  ?Florastor 250 MG capsule ?Generic drug: saccharomyces boulardii ?Stopped by: Wyley Hack X Yarixa Lightcap, NP ?  ?MILK OF MAGNESIA PO ?Stopped by: Toddy Boyd X Taleia Sadowski, NP ?  ? ?  ? ?TAKE these medications   ? ?acetaminophen 325 MG tablet ?Commonly known as: TYLENOL ?Take 650 mg by mouth every 4 (four) hours as needed for headache or mild pain. ?  ?Calcium Carbonate 500 MG Chew ?Chew 500 mg by mouth daily. ?  ?Calcium Citrate-Vitamin D 315-5 MG-MCG Tabs ?Take 1 tablet by mouth daily. ?  ?DHA NATURAL OMEGA-3 PO ?Take 1 capsule by mouth daily. Omega-3 DHA (DR/EC) '350mg'$ -'325mg'$ -'90mg'$ -'597mg'$  ?  ?enoxaparin 30 MG/0.3ML injection ?Commonly known as: LOVENOX ?Inject 0.3 mLs (30 mg total) into the skin daily. ?  ?fexofenadine 180 MG tablet ?Commonly known as: ALLEGRA ?Take 180 mg by mouth daily. ?  ?guaiFENesin 600 MG 12 hr tablet ?Commonly known as: [place] ?Take 600 mg by mouth 2 (two) times daily as needed for cough. ?  ?lactose free nutrition Liqd ?Take 237 mLs by mouth 2 (two) times daily between meals. ?  ?nystatin powder ?Commonly known as: MYCOSTATIN/NYSTOP ?Apply 1 application. topically 2 (two) times daily as needed (rash under  breast). ?  ?OMEGA-3 FISH OIL PO ?Take 1 capsule by mouth daily. ?  ?omeprazole 20 MG capsule ?Commonly known as: PRILOSEC ?Take 20 mg by mouth daily. ?  ?oxyCODONE 5 MG immediate release tablet ?Commonly known as: Oxy

## 2021-07-23 NOTE — Progress Notes (Signed)
?Cardiology Office Note:   ? ?Date:  07/23/2021  ? ?ID:  Tonya Thompson, DOB 1955-07-20, MRN [id number] ? ?PCP:  Virgie Dad, MD  ?[facility] Cardiologist:  Werner Lean, MD  ?[facility] Electrophysiologist:  None  ? ?Chief Complaint: hospital follow up  ? ?History of Present Illness:   ? ?Tonya Thompson is a 66 y.o. 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  ? ?Recently established care with Dr. Glenford Bayley February 2023 for aortic atherosclerosis and coronary calcification on CT 04/2021. Per note "will reach out to Drs. Melford Aase and Delice Lesch (PCP and neurology)- if she is truly not a candidate for future anti-platelet or anticoagulation therapy, we would be extremely limited in cardiac management if she were to need care for CAD".  ? ?Admitted 07/12/21-07/17/21 for AMS after recent hospitalization for hip fracture. CT head and MRI brain with no acute intracranial findings. Keppra & Depakote were discontinued due to elevated ammonia level by neuro. Tx with abx for pneumonia. Echo with LVEF of 55-60% and grade 1 DD.  ? ?Patient presents for follow-up from facility.  Patient reported constant lower sternal achy discomfort for past 4 to 6 months.  It exacerbated by movement and laying down.  She is on omeprazole for GERD.  She does therapy without significant exacerbation of her symptoms.  No syncope. ? ? ?Past Medical History:  ?Diagnosis Date  ? Acute kidney injury ([place]) 09/03/2019  ? 05-2019 kidney stone removed  ? Allergy   ? seasonal allergies  ? Arthritis   ? LEFT hand  ? AVM (arteriovenous malformation) brain   ? Cataract   ? bilateral-not a surgical candidate at this time (07/23/2020)  ? COVID-19 virus infection 08/2020  ? GERD (gastroesophageal reflux disease)   ? on meds  ? Hyperlipidemia   ?  diet controlled  ? Insulin resistance 02/16/2018  ? Seizures ([place])   ? 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 Stroke [facility])   ? 5 total so far (age [phone])  ? Ureteral stone 05/10/2019  ? has multiple kidney stones noted from Urologist  ? Uses roller walker   ? ? ?Past Surgical History:  ?Procedure Laterality Date  ? BRAIN SURGERY  09/19/1978  ? at [facility], Dr. Leida Lauth  ? BRONCHIAL BIOPSY  04/21/2021  ? Procedure: BRONCHIAL BIOPSIES;  Surgeon: Garner Nash, DO;  Location: [place] [facility];  Service: Pulmonary;;  ? BRONCHIAL BRUSHINGS  04/21/2021  ? Procedure: BRONCHIAL BRUSHINGS;  Surgeon: Garner Nash, DO;  Location: [place] [facility];  Service: Pulmonary;;  ? BRONCHIAL WASHINGS  04/21/2021  ? Procedure: BRONCHIAL WASHINGS;  Surgeon: Garner Nash, DO;  Location: [facility];  Service: Pulmonary;;  ? COLONOSCOPY  2017  ? [place], [place]  ? CYSTOSCOPY/URETEROSCOPY/HOLMIUM LASER/STENT PLACEMENT Left 05/10/2019  ? Procedure: VFIEPPIRJJ/OACZYSAYTK/ZSWFUXNATFTD/DUKGURK LASER/STENT PLACEMENT;  Surgeon: Raynelle Bring, MD;  Location: [facility];  Service: Urology;  Laterality: Left;  ? FIDUCIAL MARKER PLACEMENT  04/21/2021  ? Procedure: FIDUCIAL MARKER PLACEMENT;  Surgeon: Garner Nash, DO;  Location: [facility];  Service: Pulmonary;;  ? FOOT SURGERY Right 10/1985  ? ankle fusion, at [facility], Dr. Lorelee Cover  ? INTRAMEDULLARY (IM) NAIL INTERTROCHANTERIC Right 07/09/2021  ? Procedure: INTRAMEDULLARY (IM) NAIL INTERTROCHANTRIC;  Surgeon: Rod Can, MD;  Location: [facility];  Service: Orthopedics;  Laterality: Right;  ? POLYPECTOMY  2017  ? multiple adenomas  ? TUBAL LIGATION  1981  ? VIDEO BRONCHOSCOPY WITH RADIAL ENDOBRONCHIAL ULTRASOUND  04/21/2021  ? Procedure: RADIAL ENDOBRONCHIAL ULTRASOUND;  Surgeon: Garner Nash, DO;  Location: [facility]COPY;  Service: Pulmonary;;  ? WISDOM TOOTH EXTRACTION    ? ? ?Current Medications: ?Current Meds  ?Medication Sig  ? acetaminophen (TYLENOL) 325 MG tablet Take 650  mg by mouth every 4 (four) hours as needed for headache or mild pain.  ? Calcium Carbonate 500 MG CHEW Chew 500 mg by mouth daily.  ? Calcium Citrate-Vitamin D 315-5 MG-MCG TABS Take 1 tablet by mouth daily.  ? ciprofloxacin (CIPRO) 500 MG tablet Take 500 mg by mouth daily.  ? Docosahexaenoic Acid (DHA NATURAL OMEGA-3 PO) Take 1 capsule by mouth daily. Omega-3 DHA (DR/EC) '350mg'$ -'325mg'$ -'90mg'$ -'597mg'$   ? enoxaparin (LOVENOX) 30 MG/0.3ML injection Inject 0.3 mLs (30 mg total) into the skin daily.  ? fexofenadine (ALLEGRA) 180 MG tablet Take 180 mg by mouth daily.  ? guaiFENesin (MUCINEX) 600 MG 12 hr tablet Take 600 mg by mouth 2 (two) times daily as needed for cough.  ? lactose free nutrition (BOOST) LIQD Take 237 mLs by mouth 2 (two) times daily between meals.  ? Magnesium Hydroxide (MILK OF MAGNESIA PO) Take 5 mLs by mouth daily.  ? Multiple Vitamins-Minerals (THEREMS-M) TABS Take 1 tablet by mouth daily.  ? nystatin (MYCOSTATIN/NYSTOP) powder Apply 1 application. topically 2 (two) times daily as needed (rash under breast).  ? Omega-3 Fatty Acids (OMEGA-3 FISH OIL PO) Take 1 capsule by mouth daily.  ? omeprazole (PRILOSEC) 20 MG capsule Take 20 mg by mouth daily.  ? oxyCODONE (OXY IR/ROXICODONE) 5 MG immediate release tablet Take 1 tablet (5 mg total) by mouth every 4 (four) hours as needed for breakthrough pain ((for MODERATE breakthrough pain)).  ? polyethylene glycol (MIRALAX / GLYCOLAX) 17 g packet Take 17 g by mouth daily as needed for mild constipation.  ? Propylene Glycol (SYSTANE BALANCE) 0.6 % SOLN Place 1 drop into both eyes 2 (two) times daily.  ? saccharomyces boulardii (FLORASTOR) 250 MG capsule Take 250 mg by mouth daily.  ? senna (SENOKOT) 8.6 MG TABS tablet Take 1 tablet (8.6 mg total) by mouth 2 (two) times daily.  ? sodium bicarbonate 650 MG tablet Take 1 tablet (650 mg total) by mouth 2 (two) times daily.  ? topiramate (TOPAMAX) 200 MG tablet Take 300 mg by mouth 2 (two) times daily.  ? Vitamin D3  (VITAMIN D) 25 MCG tablet Take 1,000 Units by mouth daily.  ? zinc oxide 20 % ointment Apply 1 application. topically as needed (after every incontinent episode).  ?  ? ?Allergies:   Aspirin, Cheese, Chocolate, Coffee flavor, Other, and Red wine complex [germanium]  ? ?Social History  ? ?Socioeconomic History  ? Marital status: Single  ?  Spouse name: Not on file  ? Number of children: Not on file  ? Years of education: Not on file  ? Highest education level: Not on file  ?Occupational History  ? Not on file  ?Tobacco Use  ? Smoking status: Never  ?  Passive exposure: Past  ? Smokeless tobacco: Never  ?Vaping Use  ? Vaping Use: Never used  ?Substance and Sexual Activity  ? Alcohol use: No  ? Drug use: No  ? Sexual activity: Not Currently  ?  Birth control/protection: None  ?  Comment: 1st intercourse 66 yo-Fewer than 5 partners  ?Other Topics Concern  ? Not on file  ?  Social History Narrative  ? Right handed until 3rd grade   ? Left handed  ? 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 ?Social Determinants of Health  ? ?Financial Resource Strain: Not on file  ?Food Insecurity: Not on file  ?Transportation Needs: Not on file  ?Physical Activity: Not on file  ?Stress: Not on file  ?Social Connections: Not on file  ?  ? ?Family History: ?The patient's family history includes CAD in her maternal uncle; COPD in her maternal uncle; Colon polyps in her father; Dementia in her father and paternal uncle; Diabetes in her father; Heart attack in her maternal grandfather and mother; Heart disease in her mother; Heart failure in her mother; Hyperlipidemia in her father and paternal uncle; Hypertension in her paternal uncle; Melanoma in her father; Prostate cancer in her father; Stroke in her paternal grandmother. There is no history of Breast cancer, Colon cancer, Esophageal cancer, Stomach cancer, or Rectal cancer.   ? ?ROS:   ?Please see the history of present illness.    ?All other systems reviewed and are negative.   ? ?EKGs/Labs/Other Studies Reviewed:   ? ?The following studies were reviewed today: ? ?Echo 07/09/21 ?1. Left ventricular ejection fraction, by estimation, is 55 to 60%. The  ?left ventricle has normal function. Lef

## 2021-07-24 ENCOUNTER — Encounter: Payer: Self-pay | Admitting: Nurse Practitioner

## 2021-07-24 NOTE — Assessment & Plan Note (Signed)
o angina. F/u Cariology.  ?

## 2021-07-24 NOTE — Assessment & Plan Note (Signed)
Bun/creat 17/0.96 07/23/21 ?

## 2021-07-24 NOTE — Assessment & Plan Note (Signed)
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 

## 2021-07-24 NOTE — Assessment & Plan Note (Signed)
Blood pressure is controlled

## 2021-07-24 NOTE — Assessment & Plan Note (Signed)
stable, takes Senokot, MiraLax 

## 2021-07-24 NOTE — Assessment & Plan Note (Signed)
no flare ups since 2017, Tylenol is effected, treated with steroids in the past.  ?

## 2021-07-24 NOTE — Assessment & Plan Note (Addendum)
Hospitalized 07/12/21-07/17/21 s/p R hip ORIF 07/09/21, on Lovenox, f/u Dr. Lyla Glassing. R hip pain, WBAT, on Tylenol, Oxycodone for pain ?

## 2021-07-24 NOTE — Assessment & Plan Note (Signed)
R hemiparesis, as an result of a hemorrhagic stroke at age of 33 and subsequent strokes x5 per patient.  ?            Hx of cerebral AVMs ?

## 2021-07-24 NOTE — Assessment & Plan Note (Signed)
prior to R hip fx the patient ambulated with walker, R ankle brace.  ?

## 2021-07-24 NOTE — Assessment & Plan Note (Signed)
Hgb 10.3 07/21/21, repeat CBC, obtain Iron, Ferritin, Vit B12, Folate level in 2 weeks.  ?

## 2021-07-24 NOTE — Assessment & Plan Note (Signed)
supplemented, Mg 2.1 07/21/21 ?

## 2021-07-24 NOTE — Assessment & Plan Note (Signed)
,   thin liquid.  ?

## 2021-07-24 NOTE — Assessment & Plan Note (Signed)
AST/ALT 31/10 07/12/21, ammonia 51 07/21/21 ?

## 2021-07-24 NOTE — Assessment & Plan Note (Signed)
f/u Pulmonology, R upper lobe nodule, had bronchoscopy 04/21/21-negative for malignancy or infection.  ?

## 2021-07-24 NOTE — Assessment & Plan Note (Signed)
dc NaHCo3 tabs, Na 140, K 3.4, Cl 109, CO2 24 07/23/21, repeat BMP in 2 weeks.  ?

## 2021-07-24 NOTE — Assessment & Plan Note (Signed)
on diet, Fish Oil, LDL 101 04/03/21 ?

## 2021-07-24 NOTE — Assessment & Plan Note (Signed)
difficulty word findings/forming sentences. 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  TSH 3.039 07/15/21 ?

## 2021-07-24 NOTE — Assessment & Plan Note (Signed)
stable, on Omeprazole.  

## 2021-07-24 NOTE — Assessment & Plan Note (Signed)
K 3.4 07/23/21, eats better, will  f/u BMP ?

## 2021-07-29 ENCOUNTER — Other Ambulatory Visit: Payer: Self-pay

## 2021-07-29 ENCOUNTER — Encounter: Payer: Self-pay | Admitting: Neurology

## 2021-07-29 ENCOUNTER — Ambulatory Visit (INDEPENDENT_AMBULATORY_CARE_PROVIDER_SITE_OTHER): Payer: Medicare Other | Admitting: Neurology

## 2021-07-29 VITALS — BP 108/68 | HR 92 | Ht 64.0 in | Wt 147.4 lb

## 2021-07-29 DIAGNOSIS — I2510 Atherosclerotic heart disease of native coronary artery without angina pectoris: Secondary | ICD-10-CM | POA: Diagnosis not present

## 2021-07-29 DIAGNOSIS — G40209 Localization-related (focal) (partial) symptomatic epilepsy and epileptic syndromes with complex partial seizures, not intractable, without status epilepticus: Secondary | ICD-10-CM

## 2021-07-29 DIAGNOSIS — G43009 Migraine without aura, not intractable, without status migrainosus: Secondary | ICD-10-CM | POA: Diagnosis not present

## 2021-07-29 DIAGNOSIS — I2584 Coronary atherosclerosis due to calcified coronary lesion: Secondary | ICD-10-CM

## 2021-07-29 NOTE — Progress Notes (Signed)
? ?NEUROLOGY FOLLOW UP OFFICE NOTE ? ?Tonya Thompson ?[id number] ?04-Jul-1955 ? ?HISTORY OF PRESENT ILLNESS: ?I had the pleasure of seeing Tonya Thompson in follow-up in the neurology clinic on 07/29/2021.  The patient was last seen 4 months ago for well-controlled seizures and migraines. She presents for an earlier visit after hospitalization from 3/12-3/17/2023 for altered mental status. She recently had hip surgery for a hip fracture, then after arriving to the facility, she was more confused and sent back to the ER. She was treated for a UTI with urine culture showing E.coli and E. Cloacae. 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 She had an overnight EEG which showed spikes in the left centrotemporal region, focal slowing in the left temporal region and diffuse slowing, no seizures seen. Levetiracetam was stopped. She was discharged on home dose of Topiramate 348m BID. She had initially presented in status migrainosus in 07/2020, at which time Depakote was added. She states today that she does not have migraines as long as she avoids her food triggers. She denies Tonya significant headaches and uses an ice pack in the back of her head as needed. She feels her thinking is clearer, speech today is fluent, she is able to report that she is feeling a little down due to her father's passing before she had her hip fracture. She feels she is overall doing better, doing really well with therapy. She takes oxycodone every 4-6 hours for post-surgical pain. She wakes up every so often at night and naps during the day.  ? ? 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  ? ?1. Seizures. She had a hemorrhagic stroke at age 66 then had subsequent strokes after, always affecting the right side, last was in 1984. She has been on Topiramate 3045mBID for many years, switched to generic in 1999. Her last seizure was in 1998. She was last [facility]. Per notes, she has abnormal EEGs with left hemisphere slowing, maximal left temporal central area with epileptiform activity. She denies Tonya side effects on Topiramate. She denies Tonya staring/unresponsive episodes, gaps in time, olfactory/gustatory hallucinations, myoclonic jerks. She had a normal birth and early development.  There is no history of febrile convulsions, CNS infections such as meningitis/encephalitis, or family history of seizures. ? ?2. Status migrainosus. She has a history of migraines with prn Fioricet for rescue. She reports being migraine-free since 2017, however at that time she was also in status migrainosus with migraines that lasted 8 days. She recalls taking a 1-week prednisone course with Dr. DeMarlou Saith resolution of migraines. This headache started 2 weeks ago, she reports 10/10 throbbing pain in the frontal regions, with tenderness to palpation. She has been nauseated, no vomiting. No visual obscurations. She is sensitive to lights. She was prescribed Prednisone 2029m tab daily for 3 days, then 1 tab daily for 4 days. She is saying she was only taking 1 tablet daily and took it for only 3-4 days because it was not working. Headaches progressively worsened that she has been to the ER 3 times  in the past week. In the ER, she has been given IV Reglan/Benadryl, IV Fentanyl, PO Decadron through her PCP, Vicodin from the ER, and most recently IV Depacon 1010m and magnesium 2 days ago. She had a sample of Nurtec yesterday from PCP office which only made her sleep 2 hours but did not help  with headache. She had a CTA head and neck on 07/24/20 which did not show Tonya acute changes. There was a chronic left MCA infarct with small areas of calcific density within the infarct. There was a cluster of vessels in the left sylvian fissure compatible with residual AVM, appears to drain into the vein of Lab[place]. ESR was normal (11). She states she stopped the Dexamethasone for a few days but took it again this morning. She states that she had done well with migraines by staying away from food triggers, she thinks there may have been something in her food that set this off. She manages her medications and denies missing doses. She has some soreness in her neck. She takes Baclofen for leg spasticity. She used to get 8 hours of sleep, but since headaches recurred, she only gets 5 hours.  ? ?PAST MEDICAL HISTORY: ?Past Medical History:  ?Diagnosis Date  ? Acute kidney injury ([place] 09/03/2019  ? 05-2019 kidney stone removed  ? Allergy   ? seasonal allergies  ? Arthritis   ? LEFT hand  ? AVM (arteriovenous malformation) brain   ? Cataract   ? bilateral-not a surgical candidate at this time (07/23/2020)  ? COVID-19 virus infection 08/2020  ? GERD (gastroesophageal reflux disease)   ? on meds  ? Hyperlipidemia   ? diet controlled  ? Insulin resistance 02/16/2018  ? Seizures ([place]   ? 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 St[place] ([facility]   ? 5 total so far (age [phone]  ? Ureteral stone 05/10/2019  ? has multiple kidney stones noted from Urologist  ? Uses roller walker   ? ? ?MEDICATIONS: ?Current Outpatient Medications on File Prior to Visit  ?Medication Sig Dispense Refill  ? acetaminophen (TYLENOL) 325 MG tablet Take 650 mg by mouth every 4 (four) hours as needed for headache or mild pain.    ? Calcium Carbonate 500 MG CHEW Chew 500 mg by mouth daily.    ? Calcium Citrate-Vitamin D 315-5 MG-MCG TABS Take 1 tablet by mouth daily.    ? Docosahexaenoic Acid (DHA NATURAL OMEGA-3 PO) Take 1 capsule by  mouth daily. Omega-3 DHA (DR/EC) 3575m325mg-90mg-597mg    ? enoxaparin (LOVENOX) 30 MG/0.3ML injection Inject 0.3 mLs (30 mg total) into the skin daily. 9 mL 0  ? fexofenadine (ALLEGRA) 180 MG tablet Take 180 mg by mouth daily.    ? guaiFENesin (MUCINEX) 600 MG 12 hr tablet Take 600 mg by mouth 2 (two) times daily as needed for cough.    ? Multiple Vitamins-Minerals (THEREMS-M) TABS Take 1 tablet by mouth daily.    ? nystatin (MYCOSTATIN/NYSTOP) powder Apply 1 application. topically 2 (two) times daily as needed (rash under breast).    ? Omega-3 Fatty Acids (OMEGA-3 FISH OIL PO) Take 1 capsule by mouth daily.    ? omeprazole (PRILOSEC) 20 MG capsule Take 20 mg by mouth daily.    ? oxyCODONE (OXY IR/ROXICODONE) 5 MG immediate release tablet Take 1 tablet (5 mg total) by mouth every 4 (four) hours as needed for breakthrough pain ((for MODERATE breakthrough pain)). 30 tablet 0  ? polyethylene glycol (MIRALAX / GLYCOLAX)  17 g packet Take 17 g by mouth daily as needed for mild constipation. 14 each 0  ? Propylene Glycol (SYSTANE BALANCE) 0.6 % SOLN Place 1 drop into both eyes 2 (two) times daily.    ? senna (SENOKOT) 8.6 MG TABS tablet Take 1 tablet (8.6 mg total) by mouth 2 (two) times daily. 120 tablet 0  ? sodium bicarbonate 650 MG tablet Take 1 tablet (650 mg total) by mouth 2 (two) times daily. 60 tablet 0  ? topiramate (TOPAMAX) 200 MG tablet Take 300 mg by mouth 2 (two) times daily.    ? Vitamin D3 (VITAMIN D) 25 MCG tablet Take 1,000 Units by mouth daily.    ? zinc oxide 20 % ointment Apply 1 application. topically as needed (after every incontinent episode).    ? ?No current facility-administered medications on file prior to visit.  ? ? ?ALLERGIES: ?Allergies  ?Allergen Reactions  ? Aspirin Other (See Comments)  ?  CONTRAINDICATED DUE TO HISTORY OF BLEEDING IN BRAIN  ? Cheese   ?  Cant take due to history of severe migraines  ? Chocolate   ?  Cant take due to history of severe migraines  ? Coffee Flavor   ?   Cant take due to history of severe migraines  ? Other   ?   Nuts - Cant take due to history of severe migraines  ? Red Wine Complex [Germanium]   ?  Cant take due to history of severe migraines  ? ? ?FAMILY H

## 2021-07-29 NOTE — Patient Instructions (Signed)
Good to see you doing much better! Continue Topamax '300mg'$  twice a day. Continue physical therapy. Follow-up in 6 months, call for any changes. ? ? ?Seizure Precautions: ?1. If medication has been prescribed for you to prevent seizures, take it exactly as directed.  Do not stop taking the medicine without talking to your doctor first, even if you have not had a seizure in a long time.  ? ?2. Avoid activities in which a seizure would cause danger to yourself or to others.  Don't operate dangerous machinery, swim alone, or climb in high or dangerous places, such as on ladders, roofs, or girders.  Do not drive unless your doctor says you may. ? ?3. If you have any warning that you may have a seizure, lay down in a safe place where you can't hurt yourself.   ? ?4.  No driving for 6 months from last seizure, as per [facility].   Please refer to the following link on the [facility] website for more information: http://[email or web address].org/[email or web address]/driving/drivingu.cfm  ? ?5.  Maintain good sleep hygiene. ? ?6.  Contact your doctor if you have any problems that may be related to the medicine you are taking. ? ?7.  Call 911 and bring the patient back to the ED if: ?      ? A.  The seizure lasts longer than 5 minutes.      ? B.  The patient doesn't awaken shortly after the seizure ? C.  The patient has new problems such as difficulty seeing, speaking or moving ? D.  The patient was injured during the seizure ? E.  The patient has a temperature over 102 F (39C) ? F.  The patient vomited and now is having trouble breathing ?      ? ?

## 2021-08-03 DIAGNOSIS — M25551 Pain in right hip: Secondary | ICD-10-CM | POA: Diagnosis not present

## 2021-08-03 DIAGNOSIS — S72141D Displaced intertrochanteric fracture of right femur, subsequent encounter for closed fracture with routine healing: Secondary | ICD-10-CM | POA: Diagnosis not present

## 2021-08-05 ENCOUNTER — Telehealth: Payer: Self-pay

## 2021-08-05 MED ORDER — ROSUVASTATIN CALCIUM 10 MG PO TABS: 10.0000 mg | ORAL_TABLET | Freq: Every day | ORAL | 3 refills | Status: DC

## 2021-08-05 NOTE — Telephone Encounter (Signed)
The patient has been notified of the result and verbalized understanding.  All questions (if any) were answered. ?Precious Gilding, RN 08/05/2021 9:50 AM   ? ?Pt reports is currently living at [facility] and is unsure of what pharmacy is used. RN will contact Nurse for pharmacy and arrange f/u labs.   ?Spoke with Deja nurse at facility she advised to send script for medication and labs to facility.  Fax number [phone]. ?Orders faxed over.  ?

## 2021-08-05 NOTE — Telephone Encounter (Signed)
-----   Message from Hoagland K sent at 06/19/2021  8:20 AM EST ----- ?Tried calling her to handle for you, but no answer. Sorry! ?----- Message ----- ?From: Werner Lean, MD ?Sent: 06/18/2021  10:03 AM EST ?To: [facility] Triage ? ?Results: ?Hypoalbuminemia ?LFTs have improved ?LDL above goal ?Plan: ?Rosuvastatin 10 mg PO daily ?Labs in three months ? ?Werner Lean, MD ? ? ?

## 2021-08-07 LAB — IRON,TIBC AND FERRITIN PANEL
%SAT: 42
Ferritin: 170
Iron: 93
TIBC: 219

## 2021-08-07 LAB — BASIC METABOLIC PANEL
BUN: 14 (ref 4–21)
CO2: 26 — AB (ref 13–22)
Chloride: 109 — AB (ref 99–108)
Creatinine: 1.2 — AB (ref 0.5–1.1)
Glucose: 84
Potassium: 3.6 mEq/L (ref 3.5–5.1)
Sodium: 142 (ref 137–147)

## 2021-08-07 LAB — COMPREHENSIVE METABOLIC PANEL
Calcium: 9.6 (ref 8.7–10.7)
eGFR: 52

## 2021-08-07 LAB — CBC AND DIFFERENTIAL
HCT: 33 — AB (ref 36–46)
Hemoglobin: 10.7 — AB (ref 12.0–16.0)
Neutrophils Absolute: 1488
Platelets: 267 10*3/uL (ref 150–400)
WBC: 4.3

## 2021-08-07 LAB — VITAMIN B12: Vitamin B-12: 391

## 2021-08-07 LAB — CBC: RBC: 3.07 — AB (ref 3.87–5.11)

## 2021-08-13 ENCOUNTER — Ambulatory Visit: Payer: Medicare Other | Admitting: Adult Health

## 2021-08-19 ENCOUNTER — Non-Acute Institutional Stay (SKILLED_NURSING_FACILITY): Payer: Medicare Other | Admitting: Nurse Practitioner

## 2021-08-19 DIAGNOSIS — I2584 Coronary atherosclerosis due to calcified coronary lesion: Secondary | ICD-10-CM

## 2021-08-19 DIAGNOSIS — I2510 Atherosclerotic heart disease of native coronary artery without angina pectoris: Secondary | ICD-10-CM | POA: Diagnosis not present

## 2021-08-19 DIAGNOSIS — K219 Gastro-esophageal reflux disease without esophagitis: Secondary | ICD-10-CM

## 2021-08-19 DIAGNOSIS — R4189 Other symptoms and signs involving cognitive functions and awareness: Secondary | ICD-10-CM | POA: Diagnosis not present

## 2021-08-19 DIAGNOSIS — N1832 Chronic kidney disease, stage 3b: Secondary | ICD-10-CM

## 2021-08-19 DIAGNOSIS — R269 Unspecified abnormalities of gait and mobility: Secondary | ICD-10-CM | POA: Diagnosis not present

## 2021-08-19 DIAGNOSIS — D500 Iron deficiency anemia secondary to blood loss (chronic): Secondary | ICD-10-CM

## 2021-08-19 DIAGNOSIS — G8191 Hemiplegia, unspecified affecting right dominant side: Secondary | ICD-10-CM | POA: Diagnosis not present

## 2021-08-19 DIAGNOSIS — S72141A Displaced intertrochanteric fracture of right femur, initial encounter for closed fracture: Secondary | ICD-10-CM

## 2021-08-19 DIAGNOSIS — E782 Mixed hyperlipidemia: Secondary | ICD-10-CM | POA: Diagnosis not present

## 2021-08-19 DIAGNOSIS — E876 Hypokalemia: Secondary | ICD-10-CM | POA: Diagnosis not present

## 2021-08-19 DIAGNOSIS — G40909 Epilepsy, unspecified, not intractable, without status epilepticus: Secondary | ICD-10-CM | POA: Diagnosis not present

## 2021-08-19 NOTE — Progress Notes (Signed)
?Location:   S[facility] ?Nursing Home Room Number: 40B ?Place of Service:  SNF (31) ?Provider: Marlana Latus NP ? ?Virgie Dad, MD ? ?Patient Care Team: ?Virgie Dad, MD as PCP - General (Internal Medicine) ?Werner Lean, MD as PCP - Cardiology (Cardiology) ?Cameron Sprang, MD as Consulting Physician (Neurology) ?Corliss Parish, MD as Consulting Physician (Nephrology) ?Raynelle Bring, MD as Consulting Physician (Urology) ? ?Extended Emergency Contact Information ?Primary Emergency Contact: Weeks,Wendy ? [place] ?Home Phone: [phone] ?Mobile Phone: [phone] ?Relation: Relative ?Secondary Emergency Contact: Roper,Paula ?Home Phone: [phone] ?Mobile Phone: [phone] ?Relation: Sister ? ?Code Status:  DNR ?Goals of care: Advanced Directive information ? ?  07/29/2021  ?  1:09 PM  ?Advanced Directives  ?Does Patient Have a Medical Advance Directive? Yes  ? ? ? ?Chief Complaint  ?Patient presents with  ? Medical Management of Chronic Issues  ? ? ?HPI:  ?Pt is a 66 y.o. female seen today for medical management of chronic diseases.   ? ?Constipation, stable, takes Senokot, MiraLax ?            Hospitalized 07/12/21-07/17/21 s/p R hip ORIF 07/09/21, on Lovenox, the patient refusal of Lovenox, f/u Dr. Lyla Glassing.  ?            R hip pain, WBAT, on Tylenol, Oxycodone for pain ?            Dysphagia 2, thin liquid.  ?            GERD, stable, on Omeprazole. ?            Anemia, post Op, Hgb 10.3 07/21/21 ?            Hypokalemia, K 3.4 07/23/21 ?            Hypomagnesemia, supplemented, Mg 2.1 07/21/21 ?            Metabolic acidosis, off NaHCo3 07/23/21 ?             CAD, no angina. F/u Cariology.  ?            HTN controlled w/o meds.  ?            HLD 08/10/21 Cardiology Rosuvastatin '10mg'$  qd ?            Elevated LFT AST/ALT 31/10 07/12/21, ammonia 51 07/21/21 ?            Impaired cognition, difficulty word findings/forming sentences. 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  TSH 3.039 07/15/21 ?Migraines, no flare ups since 2017, Tylenol is effected, treated with steroids in the past.  ?            Pulmonary nodules, f/u Pulmonology, R upper lobe nodule, had bronchoscopy 04/21/21-negative for malignancy or infection.  ?            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  ?            Hx of cerebral AVMs ?            CKD III, Bun/creat 17/0.96 07/23/21 ?            Gait abnormality, patient ambulated  with walker, R ankle brace.  ?            Anemia, Hgb 10.3 07/21/21 ?  ? ?Past Medical History:  ?Diagnosis Date  ? Acute kidney injury ([place]) 09/03/2019  ? 05-2019 kidney stone removed  ? Allergy   ? seasonal allergies  ? Arthritis   ? LEFT hand  ? AVM (arteriovenous malformation) brain   ? Cataract   ? bilateral-not a surgical candidate at this time (07/23/2020)  ? COVID-19 virus infection 08/2020  ? GERD (gastroesophageal reflux disease)   ? on meds  ? Hyperlipidemia   ? diet controlled  ? Insulin resistance 02/16/2018  ? Seizures ([place])   ? 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 Stroke [facility])   ? 5 total so far (age [phone])  ? Ureteral stone 05/10/2019  ? has multiple kidney stones noted from Urologist  ? Uses roller walker   ? ?Past Surgical History:  ?Procedure Laterality Date  ? BRAIN SURGERY  09/19/1978  ? at [facility], Dr. Leida Lauth  ? BRONCHIAL BIOPSY  04/21/2021  ? Procedure: BRONCHIAL BIOPSIES;  Surgeon: Garner Nash, DO;  Location: [place] [facility];  Service: Pulmonary;;  ? BRONCHIAL BRUSHINGS  04/21/2021  ? Procedure: BRONCHIAL BRUSHINGS;  Surgeon: Garner Nash, DO;  Location: [place] [facility];  Service: Pulmonary;;  ? BRONCHIAL WASHINGS  04/21/2021  ? Procedure: BRONCHIAL WASHINGS;  Surgeon: Garner Nash, DO;   Location: [place] [facility];  Service: Pulmonary;;  ? COLONOSCOPY  2017  ? [place], [place]  ? CYSTOSCOPY/URETEROSCOPY/HOLMIUM LASER/STENT PLACEMENT Left 05/10/2019  ? Procedure: GGEZMOQHUT/MLYYTKPTWS/FKCLEXNTZGYF/VCBSWHQ LASER/STENT PLACEMENT;  Surgeon: Raynelle Bring, MD;  Location: [facility];  Service: Urology;  Laterality: Left;  ? FIDUCIAL MARKER PLACEMENT  04/21/2021  ? Procedure: FIDUCIAL MARKER PLACEMENT;  Surgeon: Garner Nash, DO;  Location: [place] [facility];  Service: Pulmonary;;  ? FOOT SURGERY Right 10/1985  ? ankle fusion, at [facility], Dr. Lorelee Cover  ? INTRAMEDULLARY (IM) NAIL INTERTROCHANTERIC Right 07/09/2021  ? Procedure: INTRAMEDULLARY (IM) NAIL INTERTROCHANTRIC;  Surgeon: Rod Can, MD;  Location: [facility];  Service: Orthopedics;  Laterality: Right;  ? POLYPECTOMY  2017  ? multiple adenomas  ? TUBAL LIGATION  1981  ? VIDEO BRONCHOSCOPY WITH RADIAL ENDOBRONCHIAL ULTRASOUND  04/21/2021  ? Procedure: RADIAL ENDOBRONCHIAL ULTRASOUND;  Surgeon: Garner Nash, DO;  Location: [place] [facility];  Service: Pulmonary;;  ? WISDOM TOOTH EXTRACTION    ? ? ?Allergies  ?Allergen Reactions  ? Aspirin Other (See Comments)  ?  CONTRAINDICATED DUE TO HISTORY OF BLEEDING IN BRAIN  ? Cheese   ?  Cant take due to history of severe migraines  ? Chocolate   ?  Cant take due to history of severe migraines  ? Coffee Flavor   ?  Cant take due to history of severe migraines  ? Other   ?   Nuts - Cant take due to history of severe migraines  ? Red Wine Complex [Germanium]   ?  Cant take due to history of severe migraines  ? ? ?Allergies as of 08/19/2021   ? ?   Reactions  ? Aspirin Other (See Comments)  ? CONTRAINDICATED DUE TO HISTORY OF BLEEDING IN BRAIN  ? Cheese   ? Cant take due to history of severe migraines  ? Chocolate   ? Cant take due to history of severe migraines  ? Coffee Flavor   ? Cant take due to history of severe migraines  ? Other   ?  Nuts -  Cant take due to history of severe migraines  ? Red Wine Complex [germanium]   ? Cant  take due to history of severe migraines  ? ?  ? ?  ?Medication List  ?  ? ?  ? Accurate as of August 19, 2021 11:59 PM. If you have any questions, ask your nurse or doctor.  ?  ?  ? ?  ? ?acetaminophen 325 MG tablet ?Commonly known as: TYLENOL ?Take 650 mg by mouth every 4 (four) hours as needed for headache or mild pain. ?  ?Calcium Carbonate 500 MG Chew ?Chew 500 mg by mouth daily. ?  ?Calcium Citrate-Vitamin D 315-5 MG-MCG Tabs ?Take 1 tablet by mouth daily. ?  ?DHA NATURAL OMEGA-3 PO ?Take 1 capsule by mouth daily. Omega-3 DHA (DR/EC) '350mg'$ -'325mg'$ -'90mg'$ -'597mg'$  ?  ?enoxaparin 30 MG/0.3ML injection ?Commonly known as: LOVENOX ?Inject 0.3 mLs (30 mg total) into the skin daily. ?  ?fexofenadine 180 MG tablet ?Commonly known as: ALLEGRA ?Take 180 mg by mouth daily. ?  ?guaiFENesin 600 MG 12 hr tablet ?Commonly known as: [place] ?Take 600 mg by mouth 2 (two) times daily as needed for cough. ?  ?nystatin powder ?Commonly known as: MYCOSTATIN/NYSTOP ?Apply 1 application. topically 2 (two) times daily as needed (rash under breast). ?  ?OMEGA-3 FISH OIL PO ?Take 1 capsule by mouth daily. ?  ?omeprazole 20 MG capsule ?Commonly known as: PRILOSEC ?Take 20 mg by mouth daily. ?  ?oxyCODONE 5 MG immediate release tablet ?Commonly known as: Oxy IR/ROXICODONE ?Take 1 tablet (5 mg total) by mouth every 4 (four) hours as needed for breakthrough pain ((for MODERATE breakthrough pain)). ?  ?polyethylene glycol 17 g packet ?Commonly known as: MIRALAX / GLYCOLAX ?Take 17 g by mouth daily as needed for mild constipation. ?  ?rosuvastatin 10 MG tablet ?Commonly known as: CRESTOR ?Take 1 tablet (10 mg total) by mouth daily. ?  ?senna 8.6 MG Tabs tablet ?Commonly known as: SENOKOT ?Take 1 tablet (8.6 mg total) by mouth 2 (two) times daily. ?  ?Systane Balance 0.6 % Soln ?Generic drug: Propylene Glycol ?Place 1 drop into both eyes 2 (two) times daily. ?  ?Therems-M Tabs ?Take 1 tablet by mouth daily. ?  ?topiramate 200 MG tablet ?Commonly  known as: TOPAMAX ?Take 300 mg by mouth 2 (two) times daily. ?  ?Vitamin D3 25 MCG tablet ?Commonly known as: Vitamin D ?Take 1,000 Units by mouth daily. ?  ?zinc oxide 20 % ointment ?Apply 1 application.

## 2021-08-20 ENCOUNTER — Telehealth: Payer: Self-pay | Admitting: Neurology

## 2021-08-20 NOTE — Telephone Encounter (Signed)
[facility] called stating that this patient has been taking the medication Lovenox for about a mth due to a hip fracture.  They wanted to make sure it was ok for her to continue with this med and it doesn't interact with her other meds. ?[phone] ?Ext [phone] ?

## 2021-08-20 NOTE — Assessment & Plan Note (Signed)
no angina. F/u Cariology.  

## 2021-08-20 NOTE — Assessment & Plan Note (Signed)
Bun/creat 17/0.96 07/23/21 ?

## 2021-08-20 NOTE — Assessment & Plan Note (Signed)
post Op, Hgb 10.3 07/21/21 ?

## 2021-08-20 NOTE — Assessment & Plan Note (Signed)
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

## 2021-08-20 NOTE — Assessment & Plan Note (Signed)
atient ambulated with walker, R ankle brace.  ?

## 2021-08-20 NOTE — Assessment & Plan Note (Signed)
R hemiparesis, as an result of a hemorrhagic stroke at age of 82 and subsequent strokes x5 per patient. Hx of cerebral AVMs ?

## 2021-08-20 NOTE — Assessment & Plan Note (Signed)
stable, on Omeprazole.  

## 2021-08-20 NOTE — Assessment & Plan Note (Signed)
difficulty word findings/forming sentences. 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  TSH 3.039 07/15/21 ?

## 2021-08-20 NOTE — Assessment & Plan Note (Signed)
supplemented, Mg 2.1 07/21/21 ?

## 2021-08-20 NOTE — Assessment & Plan Note (Signed)
K 3.4 07/23/21 ?

## 2021-08-20 NOTE — Assessment & Plan Note (Addendum)
Hospitalized 07/12/21-07/17/21 s/p R hip ORIF 07/09/21, on Lovenox, the patient refusal of Lovenox, f/u Dr. Lyla Glassing. Ambulates with walker, uses prn Oxycodone, Tylenol for pain.  ?

## 2021-08-20 NOTE — Assessment & Plan Note (Addendum)
08/10/21 Cardiology Rosuvastatin '10mg'$  qd ?

## 2021-08-21 ENCOUNTER — Encounter: Payer: Self-pay | Admitting: Nurse Practitioner

## 2021-08-21 NOTE — Telephone Encounter (Signed)
Called and spoke to the Nurse at [facility] and informed her of what Dr. Aram Candela stated that The only medication she prescribes for her is the Topamax which should not interact with Lovenox. For her other meds, would check with pharmacist or her PCP. ? ? ?

## 2021-08-21 NOTE — Telephone Encounter (Signed)
The only medication I prescribe for her is the Topamax which should not interact with Lovenox. For her other meds, would check with pharmacist or her PCP, thanks ?

## 2021-08-31 DIAGNOSIS — S72141D Displaced intertrochanteric fracture of right femur, subsequent encounter for closed fracture with routine healing: Secondary | ICD-10-CM | POA: Diagnosis not present

## 2021-09-04 ENCOUNTER — Ambulatory Visit: Payer: Medicare Other | Admitting: Neurology

## 2021-09-10 DIAGNOSIS — S72141D Displaced intertrochanteric fracture of right femur, subsequent encounter for closed fracture with routine healing: Secondary | ICD-10-CM | POA: Diagnosis not present

## 2021-09-10 DIAGNOSIS — R278 Other lack of coordination: Secondary | ICD-10-CM | POA: Diagnosis not present

## 2021-09-10 DIAGNOSIS — Z9181 History of falling: Secondary | ICD-10-CM | POA: Diagnosis not present

## 2021-09-10 DIAGNOSIS — R262 Difficulty in walking, not elsewhere classified: Secondary | ICD-10-CM | POA: Diagnosis not present

## 2021-09-10 DIAGNOSIS — R29898 Other symptoms and signs involving the musculoskeletal system: Secondary | ICD-10-CM | POA: Diagnosis not present

## 2021-09-10 DIAGNOSIS — I69951 Hemiplegia and hemiparesis following unspecified cerebrovascular disease affecting right dominant side: Secondary | ICD-10-CM | POA: Diagnosis not present

## 2021-09-10 DIAGNOSIS — M6281 Muscle weakness (generalized): Secondary | ICD-10-CM | POA: Diagnosis not present

## 2021-09-14 DIAGNOSIS — R262 Difficulty in walking, not elsewhere classified: Secondary | ICD-10-CM | POA: Diagnosis not present

## 2021-09-14 DIAGNOSIS — I69951 Hemiplegia and hemiparesis following unspecified cerebrovascular disease affecting right dominant side: Secondary | ICD-10-CM | POA: Diagnosis not present

## 2021-09-14 DIAGNOSIS — R278 Other lack of coordination: Secondary | ICD-10-CM | POA: Diagnosis not present

## 2021-09-14 DIAGNOSIS — S72141D Displaced intertrochanteric fracture of right femur, subsequent encounter for closed fracture with routine healing: Secondary | ICD-10-CM | POA: Diagnosis not present

## 2021-09-14 DIAGNOSIS — Z9181 History of falling: Secondary | ICD-10-CM | POA: Diagnosis not present

## 2021-09-14 DIAGNOSIS — R29898 Other symptoms and signs involving the musculoskeletal system: Secondary | ICD-10-CM | POA: Diagnosis not present

## 2021-09-15 DIAGNOSIS — R262 Difficulty in walking, not elsewhere classified: Secondary | ICD-10-CM | POA: Diagnosis not present

## 2021-09-15 DIAGNOSIS — S72141D Displaced intertrochanteric fracture of right femur, subsequent encounter for closed fracture with routine healing: Secondary | ICD-10-CM | POA: Diagnosis not present

## 2021-09-15 DIAGNOSIS — R29898 Other symptoms and signs involving the musculoskeletal system: Secondary | ICD-10-CM | POA: Diagnosis not present

## 2021-09-15 DIAGNOSIS — I69951 Hemiplegia and hemiparesis following unspecified cerebrovascular disease affecting right dominant side: Secondary | ICD-10-CM | POA: Diagnosis not present

## 2021-09-15 DIAGNOSIS — Z9181 History of falling: Secondary | ICD-10-CM | POA: Diagnosis not present

## 2021-09-15 DIAGNOSIS — R278 Other lack of coordination: Secondary | ICD-10-CM | POA: Diagnosis not present

## 2021-09-16 DIAGNOSIS — R278 Other lack of coordination: Secondary | ICD-10-CM | POA: Diagnosis not present

## 2021-09-16 DIAGNOSIS — R262 Difficulty in walking, not elsewhere classified: Secondary | ICD-10-CM | POA: Diagnosis not present

## 2021-09-16 DIAGNOSIS — S72141D Displaced intertrochanteric fracture of right femur, subsequent encounter for closed fracture with routine healing: Secondary | ICD-10-CM | POA: Diagnosis not present

## 2021-09-16 DIAGNOSIS — I69951 Hemiplegia and hemiparesis following unspecified cerebrovascular disease affecting right dominant side: Secondary | ICD-10-CM | POA: Diagnosis not present

## 2021-09-16 DIAGNOSIS — R29898 Other symptoms and signs involving the musculoskeletal system: Secondary | ICD-10-CM | POA: Diagnosis not present

## 2021-09-16 DIAGNOSIS — Z9181 History of falling: Secondary | ICD-10-CM | POA: Diagnosis not present

## 2021-09-17 DIAGNOSIS — S72141D Displaced intertrochanteric fracture of right femur, subsequent encounter for closed fracture with routine healing: Secondary | ICD-10-CM | POA: Diagnosis not present

## 2021-09-17 DIAGNOSIS — R278 Other lack of coordination: Secondary | ICD-10-CM | POA: Diagnosis not present

## 2021-09-17 DIAGNOSIS — R262 Difficulty in walking, not elsewhere classified: Secondary | ICD-10-CM | POA: Diagnosis not present

## 2021-09-17 DIAGNOSIS — I69951 Hemiplegia and hemiparesis following unspecified cerebrovascular disease affecting right dominant side: Secondary | ICD-10-CM | POA: Diagnosis not present

## 2021-09-17 DIAGNOSIS — Z9181 History of falling: Secondary | ICD-10-CM | POA: Diagnosis not present

## 2021-09-17 DIAGNOSIS — R29898 Other symptoms and signs involving the musculoskeletal system: Secondary | ICD-10-CM | POA: Diagnosis not present

## 2021-09-18 DIAGNOSIS — R29898 Other symptoms and signs involving the musculoskeletal system: Secondary | ICD-10-CM | POA: Diagnosis not present

## 2021-09-18 DIAGNOSIS — Z9181 History of falling: Secondary | ICD-10-CM | POA: Diagnosis not present

## 2021-09-18 DIAGNOSIS — R278 Other lack of coordination: Secondary | ICD-10-CM | POA: Diagnosis not present

## 2021-09-18 DIAGNOSIS — R262 Difficulty in walking, not elsewhere classified: Secondary | ICD-10-CM | POA: Diagnosis not present

## 2021-09-18 DIAGNOSIS — I69951 Hemiplegia and hemiparesis following unspecified cerebrovascular disease affecting right dominant side: Secondary | ICD-10-CM | POA: Diagnosis not present

## 2021-09-18 DIAGNOSIS — S72141D Displaced intertrochanteric fracture of right femur, subsequent encounter for closed fracture with routine healing: Secondary | ICD-10-CM | POA: Diagnosis not present

## 2021-09-21 DIAGNOSIS — Z9181 History of falling: Secondary | ICD-10-CM | POA: Diagnosis not present

## 2021-09-21 DIAGNOSIS — R278 Other lack of coordination: Secondary | ICD-10-CM | POA: Diagnosis not present

## 2021-09-21 DIAGNOSIS — S72141D Displaced intertrochanteric fracture of right femur, subsequent encounter for closed fracture with routine healing: Secondary | ICD-10-CM | POA: Diagnosis not present

## 2021-09-21 DIAGNOSIS — R29898 Other symptoms and signs involving the musculoskeletal system: Secondary | ICD-10-CM | POA: Diagnosis not present

## 2021-09-21 DIAGNOSIS — I69951 Hemiplegia and hemiparesis following unspecified cerebrovascular disease affecting right dominant side: Secondary | ICD-10-CM | POA: Diagnosis not present

## 2021-09-21 DIAGNOSIS — R262 Difficulty in walking, not elsewhere classified: Secondary | ICD-10-CM | POA: Diagnosis not present

## 2021-09-22 DIAGNOSIS — I69951 Hemiplegia and hemiparesis following unspecified cerebrovascular disease affecting right dominant side: Secondary | ICD-10-CM | POA: Diagnosis not present

## 2021-09-22 DIAGNOSIS — R29898 Other symptoms and signs involving the musculoskeletal system: Secondary | ICD-10-CM | POA: Diagnosis not present

## 2021-09-22 DIAGNOSIS — S72141D Displaced intertrochanteric fracture of right femur, subsequent encounter for closed fracture with routine healing: Secondary | ICD-10-CM | POA: Diagnosis not present

## 2021-09-22 DIAGNOSIS — R278 Other lack of coordination: Secondary | ICD-10-CM | POA: Diagnosis not present

## 2021-09-22 DIAGNOSIS — R262 Difficulty in walking, not elsewhere classified: Secondary | ICD-10-CM | POA: Diagnosis not present

## 2021-09-22 DIAGNOSIS — Z9181 History of falling: Secondary | ICD-10-CM | POA: Diagnosis not present

## 2021-09-23 DIAGNOSIS — I69951 Hemiplegia and hemiparesis following unspecified cerebrovascular disease affecting right dominant side: Secondary | ICD-10-CM | POA: Diagnosis not present

## 2021-09-23 DIAGNOSIS — R29898 Other symptoms and signs involving the musculoskeletal system: Secondary | ICD-10-CM | POA: Diagnosis not present

## 2021-09-23 DIAGNOSIS — S72141D Displaced intertrochanteric fracture of right femur, subsequent encounter for closed fracture with routine healing: Secondary | ICD-10-CM | POA: Diagnosis not present

## 2021-09-23 DIAGNOSIS — R278 Other lack of coordination: Secondary | ICD-10-CM | POA: Diagnosis not present

## 2021-09-23 DIAGNOSIS — R262 Difficulty in walking, not elsewhere classified: Secondary | ICD-10-CM | POA: Diagnosis not present

## 2021-09-23 DIAGNOSIS — Z9181 History of falling: Secondary | ICD-10-CM | POA: Diagnosis not present

## 2021-09-24 ENCOUNTER — Encounter: Payer: Medicare Other | Admitting: Adult Health

## 2021-09-24 DIAGNOSIS — R29898 Other symptoms and signs involving the musculoskeletal system: Secondary | ICD-10-CM | POA: Diagnosis not present

## 2021-09-24 DIAGNOSIS — R278 Other lack of coordination: Secondary | ICD-10-CM | POA: Diagnosis not present

## 2021-09-24 DIAGNOSIS — I69951 Hemiplegia and hemiparesis following unspecified cerebrovascular disease affecting right dominant side: Secondary | ICD-10-CM | POA: Diagnosis not present

## 2021-09-24 DIAGNOSIS — Z9181 History of falling: Secondary | ICD-10-CM | POA: Diagnosis not present

## 2021-09-24 DIAGNOSIS — S72141D Displaced intertrochanteric fracture of right femur, subsequent encounter for closed fracture with routine healing: Secondary | ICD-10-CM | POA: Diagnosis not present

## 2021-09-24 DIAGNOSIS — R262 Difficulty in walking, not elsewhere classified: Secondary | ICD-10-CM | POA: Diagnosis not present

## 2021-09-25 DIAGNOSIS — I69951 Hemiplegia and hemiparesis following unspecified cerebrovascular disease affecting right dominant side: Secondary | ICD-10-CM | POA: Diagnosis not present

## 2021-09-25 DIAGNOSIS — R262 Difficulty in walking, not elsewhere classified: Secondary | ICD-10-CM | POA: Diagnosis not present

## 2021-09-25 DIAGNOSIS — R29898 Other symptoms and signs involving the musculoskeletal system: Secondary | ICD-10-CM | POA: Diagnosis not present

## 2021-09-25 DIAGNOSIS — R278 Other lack of coordination: Secondary | ICD-10-CM | POA: Diagnosis not present

## 2021-09-25 DIAGNOSIS — S72141D Displaced intertrochanteric fracture of right femur, subsequent encounter for closed fracture with routine healing: Secondary | ICD-10-CM | POA: Diagnosis not present

## 2021-09-25 DIAGNOSIS — Z9181 History of falling: Secondary | ICD-10-CM | POA: Diagnosis not present

## 2021-09-29 DIAGNOSIS — R29898 Other symptoms and signs involving the musculoskeletal system: Secondary | ICD-10-CM | POA: Diagnosis not present

## 2021-09-29 DIAGNOSIS — R262 Difficulty in walking, not elsewhere classified: Secondary | ICD-10-CM | POA: Diagnosis not present

## 2021-09-29 DIAGNOSIS — Z9181 History of falling: Secondary | ICD-10-CM | POA: Diagnosis not present

## 2021-09-29 DIAGNOSIS — S72141D Displaced intertrochanteric fracture of right femur, subsequent encounter for closed fracture with routine healing: Secondary | ICD-10-CM | POA: Diagnosis not present

## 2021-09-29 DIAGNOSIS — R278 Other lack of coordination: Secondary | ICD-10-CM | POA: Diagnosis not present

## 2021-09-29 DIAGNOSIS — I69951 Hemiplegia and hemiparesis following unspecified cerebrovascular disease affecting right dominant side: Secondary | ICD-10-CM | POA: Diagnosis not present

## 2021-09-30 DIAGNOSIS — Z9181 History of falling: Secondary | ICD-10-CM | POA: Diagnosis not present

## 2021-09-30 DIAGNOSIS — R29898 Other symptoms and signs involving the musculoskeletal system: Secondary | ICD-10-CM | POA: Diagnosis not present

## 2021-09-30 DIAGNOSIS — S72141D Displaced intertrochanteric fracture of right femur, subsequent encounter for closed fracture with routine healing: Secondary | ICD-10-CM | POA: Diagnosis not present

## 2021-09-30 DIAGNOSIS — R262 Difficulty in walking, not elsewhere classified: Secondary | ICD-10-CM | POA: Diagnosis not present

## 2021-09-30 DIAGNOSIS — I69951 Hemiplegia and hemiparesis following unspecified cerebrovascular disease affecting right dominant side: Secondary | ICD-10-CM | POA: Diagnosis not present

## 2021-09-30 DIAGNOSIS — R278 Other lack of coordination: Secondary | ICD-10-CM | POA: Diagnosis not present

## 2021-10-01 DIAGNOSIS — R278 Other lack of coordination: Secondary | ICD-10-CM | POA: Diagnosis not present

## 2021-10-01 DIAGNOSIS — R262 Difficulty in walking, not elsewhere classified: Secondary | ICD-10-CM | POA: Diagnosis not present

## 2021-10-01 DIAGNOSIS — M6281 Muscle weakness (generalized): Secondary | ICD-10-CM | POA: Diagnosis not present

## 2021-10-01 DIAGNOSIS — I69951 Hemiplegia and hemiparesis following unspecified cerebrovascular disease affecting right dominant side: Secondary | ICD-10-CM | POA: Diagnosis not present

## 2021-10-01 DIAGNOSIS — Z9181 History of falling: Secondary | ICD-10-CM | POA: Diagnosis not present

## 2021-10-01 DIAGNOSIS — R29898 Other symptoms and signs involving the musculoskeletal system: Secondary | ICD-10-CM | POA: Diagnosis not present

## 2021-10-01 DIAGNOSIS — S72141D Displaced intertrochanteric fracture of right femur, subsequent encounter for closed fracture with routine healing: Secondary | ICD-10-CM | POA: Diagnosis not present

## 2021-10-02 DIAGNOSIS — Z9181 History of falling: Secondary | ICD-10-CM | POA: Diagnosis not present

## 2021-10-02 DIAGNOSIS — R262 Difficulty in walking, not elsewhere classified: Secondary | ICD-10-CM | POA: Diagnosis not present

## 2021-10-02 DIAGNOSIS — I69951 Hemiplegia and hemiparesis following unspecified cerebrovascular disease affecting right dominant side: Secondary | ICD-10-CM | POA: Diagnosis not present

## 2021-10-02 DIAGNOSIS — R29898 Other symptoms and signs involving the musculoskeletal system: Secondary | ICD-10-CM | POA: Diagnosis not present

## 2021-10-02 DIAGNOSIS — S72141D Displaced intertrochanteric fracture of right femur, subsequent encounter for closed fracture with routine healing: Secondary | ICD-10-CM | POA: Diagnosis not present

## 2021-10-02 DIAGNOSIS — R278 Other lack of coordination: Secondary | ICD-10-CM | POA: Diagnosis not present

## 2021-10-05 DIAGNOSIS — R278 Other lack of coordination: Secondary | ICD-10-CM | POA: Diagnosis not present

## 2021-10-05 DIAGNOSIS — R262 Difficulty in walking, not elsewhere classified: Secondary | ICD-10-CM | POA: Diagnosis not present

## 2021-10-05 DIAGNOSIS — R29898 Other symptoms and signs involving the musculoskeletal system: Secondary | ICD-10-CM | POA: Diagnosis not present

## 2021-10-05 DIAGNOSIS — S72141D Displaced intertrochanteric fracture of right femur, subsequent encounter for closed fracture with routine healing: Secondary | ICD-10-CM | POA: Diagnosis not present

## 2021-10-05 DIAGNOSIS — Z9181 History of falling: Secondary | ICD-10-CM | POA: Diagnosis not present

## 2021-10-05 DIAGNOSIS — I69951 Hemiplegia and hemiparesis following unspecified cerebrovascular disease affecting right dominant side: Secondary | ICD-10-CM | POA: Diagnosis not present

## 2021-10-06 DIAGNOSIS — S72141D Displaced intertrochanteric fracture of right femur, subsequent encounter for closed fracture with routine healing: Secondary | ICD-10-CM | POA: Diagnosis not present

## 2021-10-06 DIAGNOSIS — R278 Other lack of coordination: Secondary | ICD-10-CM | POA: Diagnosis not present

## 2021-10-06 DIAGNOSIS — R262 Difficulty in walking, not elsewhere classified: Secondary | ICD-10-CM | POA: Diagnosis not present

## 2021-10-06 DIAGNOSIS — Z9181 History of falling: Secondary | ICD-10-CM | POA: Diagnosis not present

## 2021-10-06 DIAGNOSIS — R29898 Other symptoms and signs involving the musculoskeletal system: Secondary | ICD-10-CM | POA: Diagnosis not present

## 2021-10-06 DIAGNOSIS — I69951 Hemiplegia and hemiparesis following unspecified cerebrovascular disease affecting right dominant side: Secondary | ICD-10-CM | POA: Diagnosis not present

## 2021-10-07 DIAGNOSIS — R29898 Other symptoms and signs involving the musculoskeletal system: Secondary | ICD-10-CM | POA: Diagnosis not present

## 2021-10-07 DIAGNOSIS — I69951 Hemiplegia and hemiparesis following unspecified cerebrovascular disease affecting right dominant side: Secondary | ICD-10-CM | POA: Diagnosis not present

## 2021-10-07 DIAGNOSIS — R262 Difficulty in walking, not elsewhere classified: Secondary | ICD-10-CM | POA: Diagnosis not present

## 2021-10-07 DIAGNOSIS — Z9181 History of falling: Secondary | ICD-10-CM | POA: Diagnosis not present

## 2021-10-07 DIAGNOSIS — R278 Other lack of coordination: Secondary | ICD-10-CM | POA: Diagnosis not present

## 2021-10-07 DIAGNOSIS — S72141D Displaced intertrochanteric fracture of right femur, subsequent encounter for closed fracture with routine healing: Secondary | ICD-10-CM | POA: Diagnosis not present

## 2021-10-08 DIAGNOSIS — R29898 Other symptoms and signs involving the musculoskeletal system: Secondary | ICD-10-CM | POA: Diagnosis not present

## 2021-10-08 DIAGNOSIS — I69951 Hemiplegia and hemiparesis following unspecified cerebrovascular disease affecting right dominant side: Secondary | ICD-10-CM | POA: Diagnosis not present

## 2021-10-08 DIAGNOSIS — Z9181 History of falling: Secondary | ICD-10-CM | POA: Diagnosis not present

## 2021-10-08 DIAGNOSIS — R278 Other lack of coordination: Secondary | ICD-10-CM | POA: Diagnosis not present

## 2021-10-08 DIAGNOSIS — S72141D Displaced intertrochanteric fracture of right femur, subsequent encounter for closed fracture with routine healing: Secondary | ICD-10-CM | POA: Diagnosis not present

## 2021-10-08 DIAGNOSIS — R262 Difficulty in walking, not elsewhere classified: Secondary | ICD-10-CM | POA: Diagnosis not present

## 2021-10-09 DIAGNOSIS — I69951 Hemiplegia and hemiparesis following unspecified cerebrovascular disease affecting right dominant side: Secondary | ICD-10-CM | POA: Diagnosis not present

## 2021-10-09 DIAGNOSIS — R262 Difficulty in walking, not elsewhere classified: Secondary | ICD-10-CM | POA: Diagnosis not present

## 2021-10-09 DIAGNOSIS — S72141D Displaced intertrochanteric fracture of right femur, subsequent encounter for closed fracture with routine healing: Secondary | ICD-10-CM | POA: Diagnosis not present

## 2021-10-09 DIAGNOSIS — Z9181 History of falling: Secondary | ICD-10-CM | POA: Diagnosis not present

## 2021-10-09 DIAGNOSIS — R278 Other lack of coordination: Secondary | ICD-10-CM | POA: Diagnosis not present

## 2021-10-09 DIAGNOSIS — R29898 Other symptoms and signs involving the musculoskeletal system: Secondary | ICD-10-CM | POA: Diagnosis not present

## 2021-10-12 DIAGNOSIS — Z9181 History of falling: Secondary | ICD-10-CM | POA: Diagnosis not present

## 2021-10-12 DIAGNOSIS — I69951 Hemiplegia and hemiparesis following unspecified cerebrovascular disease affecting right dominant side: Secondary | ICD-10-CM | POA: Diagnosis not present

## 2021-10-12 DIAGNOSIS — R262 Difficulty in walking, not elsewhere classified: Secondary | ICD-10-CM | POA: Diagnosis not present

## 2021-10-12 DIAGNOSIS — R278 Other lack of coordination: Secondary | ICD-10-CM | POA: Diagnosis not present

## 2021-10-12 DIAGNOSIS — R29898 Other symptoms and signs involving the musculoskeletal system: Secondary | ICD-10-CM | POA: Diagnosis not present

## 2021-10-12 DIAGNOSIS — S72141D Displaced intertrochanteric fracture of right femur, subsequent encounter for closed fracture with routine healing: Secondary | ICD-10-CM | POA: Diagnosis not present

## 2021-10-13 DIAGNOSIS — S72141D Displaced intertrochanteric fracture of right femur, subsequent encounter for closed fracture with routine healing: Secondary | ICD-10-CM | POA: Diagnosis not present

## 2021-10-13 DIAGNOSIS — Z9181 History of falling: Secondary | ICD-10-CM | POA: Diagnosis not present

## 2021-10-13 DIAGNOSIS — R262 Difficulty in walking, not elsewhere classified: Secondary | ICD-10-CM | POA: Diagnosis not present

## 2021-10-13 DIAGNOSIS — I69951 Hemiplegia and hemiparesis following unspecified cerebrovascular disease affecting right dominant side: Secondary | ICD-10-CM | POA: Diagnosis not present

## 2021-10-13 DIAGNOSIS — R278 Other lack of coordination: Secondary | ICD-10-CM | POA: Diagnosis not present

## 2021-10-13 DIAGNOSIS — R29898 Other symptoms and signs involving the musculoskeletal system: Secondary | ICD-10-CM | POA: Diagnosis not present

## 2021-10-14 ENCOUNTER — Other Ambulatory Visit: Payer: Self-pay | Admitting: Adult Health

## 2021-10-14 MED ORDER — OXYCODONE HCL 5 MG PO TABS: 5.0000 mg | ORAL_TABLET | ORAL | 0 refills | Status: DC | PRN

## 2021-10-15 DIAGNOSIS — R29898 Other symptoms and signs involving the musculoskeletal system: Secondary | ICD-10-CM | POA: Diagnosis not present

## 2021-10-15 DIAGNOSIS — S72141D Displaced intertrochanteric fracture of right femur, subsequent encounter for closed fracture with routine healing: Secondary | ICD-10-CM | POA: Diagnosis not present

## 2021-10-15 DIAGNOSIS — Z9181 History of falling: Secondary | ICD-10-CM | POA: Diagnosis not present

## 2021-10-15 DIAGNOSIS — I69951 Hemiplegia and hemiparesis following unspecified cerebrovascular disease affecting right dominant side: Secondary | ICD-10-CM | POA: Diagnosis not present

## 2021-10-15 DIAGNOSIS — R262 Difficulty in walking, not elsewhere classified: Secondary | ICD-10-CM | POA: Diagnosis not present

## 2021-10-15 DIAGNOSIS — R278 Other lack of coordination: Secondary | ICD-10-CM | POA: Diagnosis not present

## 2021-10-16 DIAGNOSIS — S72141D Displaced intertrochanteric fracture of right femur, subsequent encounter for closed fracture with routine healing: Secondary | ICD-10-CM | POA: Diagnosis not present

## 2021-10-16 DIAGNOSIS — R278 Other lack of coordination: Secondary | ICD-10-CM | POA: Diagnosis not present

## 2021-10-16 DIAGNOSIS — I69951 Hemiplegia and hemiparesis following unspecified cerebrovascular disease affecting right dominant side: Secondary | ICD-10-CM | POA: Diagnosis not present

## 2021-10-16 DIAGNOSIS — R262 Difficulty in walking, not elsewhere classified: Secondary | ICD-10-CM | POA: Diagnosis not present

## 2021-10-16 DIAGNOSIS — R29898 Other symptoms and signs involving the musculoskeletal system: Secondary | ICD-10-CM | POA: Diagnosis not present

## 2021-10-16 DIAGNOSIS — Z9181 History of falling: Secondary | ICD-10-CM | POA: Diagnosis not present

## 2021-10-19 DIAGNOSIS — Z9181 History of falling: Secondary | ICD-10-CM | POA: Diagnosis not present

## 2021-10-19 DIAGNOSIS — R278 Other lack of coordination: Secondary | ICD-10-CM | POA: Diagnosis not present

## 2021-10-19 DIAGNOSIS — S72141D Displaced intertrochanteric fracture of right femur, subsequent encounter for closed fracture with routine healing: Secondary | ICD-10-CM | POA: Diagnosis not present

## 2021-10-19 DIAGNOSIS — R262 Difficulty in walking, not elsewhere classified: Secondary | ICD-10-CM | POA: Diagnosis not present

## 2021-10-19 DIAGNOSIS — R29898 Other symptoms and signs involving the musculoskeletal system: Secondary | ICD-10-CM | POA: Diagnosis not present

## 2021-10-19 DIAGNOSIS — I69951 Hemiplegia and hemiparesis following unspecified cerebrovascular disease affecting right dominant side: Secondary | ICD-10-CM | POA: Diagnosis not present

## 2021-10-20 DIAGNOSIS — Z9181 History of falling: Secondary | ICD-10-CM | POA: Diagnosis not present

## 2021-10-20 DIAGNOSIS — R262 Difficulty in walking, not elsewhere classified: Secondary | ICD-10-CM | POA: Diagnosis not present

## 2021-10-20 DIAGNOSIS — S72141D Displaced intertrochanteric fracture of right femur, subsequent encounter for closed fracture with routine healing: Secondary | ICD-10-CM | POA: Diagnosis not present

## 2021-10-20 DIAGNOSIS — R29898 Other symptoms and signs involving the musculoskeletal system: Secondary | ICD-10-CM | POA: Diagnosis not present

## 2021-10-20 DIAGNOSIS — R278 Other lack of coordination: Secondary | ICD-10-CM | POA: Diagnosis not present

## 2021-10-20 DIAGNOSIS — I69951 Hemiplegia and hemiparesis following unspecified cerebrovascular disease affecting right dominant side: Secondary | ICD-10-CM | POA: Diagnosis not present

## 2021-10-21 DIAGNOSIS — Z9181 History of falling: Secondary | ICD-10-CM | POA: Diagnosis not present

## 2021-10-21 DIAGNOSIS — I69951 Hemiplegia and hemiparesis following unspecified cerebrovascular disease affecting right dominant side: Secondary | ICD-10-CM | POA: Diagnosis not present

## 2021-10-21 DIAGNOSIS — R262 Difficulty in walking, not elsewhere classified: Secondary | ICD-10-CM | POA: Diagnosis not present

## 2021-10-21 DIAGNOSIS — R29898 Other symptoms and signs involving the musculoskeletal system: Secondary | ICD-10-CM | POA: Diagnosis not present

## 2021-10-21 DIAGNOSIS — R278 Other lack of coordination: Secondary | ICD-10-CM | POA: Diagnosis not present

## 2021-10-21 DIAGNOSIS — S72141D Displaced intertrochanteric fracture of right femur, subsequent encounter for closed fracture with routine healing: Secondary | ICD-10-CM | POA: Diagnosis not present

## 2021-10-22 DIAGNOSIS — R29898 Other symptoms and signs involving the musculoskeletal system: Secondary | ICD-10-CM | POA: Diagnosis not present

## 2021-10-22 DIAGNOSIS — Z9181 History of falling: Secondary | ICD-10-CM | POA: Diagnosis not present

## 2021-10-22 DIAGNOSIS — R278 Other lack of coordination: Secondary | ICD-10-CM | POA: Diagnosis not present

## 2021-10-22 DIAGNOSIS — S72141D Displaced intertrochanteric fracture of right femur, subsequent encounter for closed fracture with routine healing: Secondary | ICD-10-CM | POA: Diagnosis not present

## 2021-10-22 DIAGNOSIS — R262 Difficulty in walking, not elsewhere classified: Secondary | ICD-10-CM | POA: Diagnosis not present

## 2021-10-22 DIAGNOSIS — I69951 Hemiplegia and hemiparesis following unspecified cerebrovascular disease affecting right dominant side: Secondary | ICD-10-CM | POA: Diagnosis not present

## 2021-10-23 DIAGNOSIS — Z9181 History of falling: Secondary | ICD-10-CM | POA: Diagnosis not present

## 2021-10-23 DIAGNOSIS — R262 Difficulty in walking, not elsewhere classified: Secondary | ICD-10-CM | POA: Diagnosis not present

## 2021-10-23 DIAGNOSIS — R278 Other lack of coordination: Secondary | ICD-10-CM | POA: Diagnosis not present

## 2021-10-23 DIAGNOSIS — R29898 Other symptoms and signs involving the musculoskeletal system: Secondary | ICD-10-CM | POA: Diagnosis not present

## 2021-10-23 DIAGNOSIS — S72141D Displaced intertrochanteric fracture of right femur, subsequent encounter for closed fracture with routine healing: Secondary | ICD-10-CM | POA: Diagnosis not present

## 2021-10-23 DIAGNOSIS — I69951 Hemiplegia and hemiparesis following unspecified cerebrovascular disease affecting right dominant side: Secondary | ICD-10-CM | POA: Diagnosis not present

## 2021-10-26 DIAGNOSIS — I69951 Hemiplegia and hemiparesis following unspecified cerebrovascular disease affecting right dominant side: Secondary | ICD-10-CM | POA: Diagnosis not present

## 2021-10-26 DIAGNOSIS — R278 Other lack of coordination: Secondary | ICD-10-CM | POA: Diagnosis not present

## 2021-10-26 DIAGNOSIS — R262 Difficulty in walking, not elsewhere classified: Secondary | ICD-10-CM | POA: Diagnosis not present

## 2021-10-26 DIAGNOSIS — Z9181 History of falling: Secondary | ICD-10-CM | POA: Diagnosis not present

## 2021-10-26 DIAGNOSIS — S72141D Displaced intertrochanteric fracture of right femur, subsequent encounter for closed fracture with routine healing: Secondary | ICD-10-CM | POA: Diagnosis not present

## 2021-10-26 DIAGNOSIS — R29898 Other symptoms and signs involving the musculoskeletal system: Secondary | ICD-10-CM | POA: Diagnosis not present

## 2021-10-27 DIAGNOSIS — R278 Other lack of coordination: Secondary | ICD-10-CM | POA: Diagnosis not present

## 2021-10-27 DIAGNOSIS — S72141D Displaced intertrochanteric fracture of right femur, subsequent encounter for closed fracture with routine healing: Secondary | ICD-10-CM | POA: Diagnosis not present

## 2021-10-27 DIAGNOSIS — R29898 Other symptoms and signs involving the musculoskeletal system: Secondary | ICD-10-CM | POA: Diagnosis not present

## 2021-10-27 DIAGNOSIS — Z9181 History of falling: Secondary | ICD-10-CM | POA: Diagnosis not present

## 2021-10-27 DIAGNOSIS — R262 Difficulty in walking, not elsewhere classified: Secondary | ICD-10-CM | POA: Diagnosis not present

## 2021-10-27 DIAGNOSIS — I69951 Hemiplegia and hemiparesis following unspecified cerebrovascular disease affecting right dominant side: Secondary | ICD-10-CM | POA: Diagnosis not present

## 2021-10-28 ENCOUNTER — Ambulatory Visit
Admission: RE | Admit: 2021-10-28 | Discharge: 2021-10-28 | Disposition: A | Discharge status: Home / Self Care | Payer: Medicare Other | Source: Ambulatory Visit

## 2021-10-28 DIAGNOSIS — R911 Solitary pulmonary nodule: Secondary | ICD-10-CM | POA: Diagnosis not present

## 2021-10-28 DIAGNOSIS — R918 Other nonspecific abnormal finding of lung field: Secondary | ICD-10-CM

## 2021-10-29 DIAGNOSIS — Z9181 History of falling: Secondary | ICD-10-CM | POA: Diagnosis not present

## 2021-10-29 DIAGNOSIS — R29898 Other symptoms and signs involving the musculoskeletal system: Secondary | ICD-10-CM | POA: Diagnosis not present

## 2021-10-29 DIAGNOSIS — S72141D Displaced intertrochanteric fracture of right femur, subsequent encounter for closed fracture with routine healing: Secondary | ICD-10-CM | POA: Diagnosis not present

## 2021-10-29 DIAGNOSIS — R262 Difficulty in walking, not elsewhere classified: Secondary | ICD-10-CM | POA: Diagnosis not present

## 2021-10-29 DIAGNOSIS — R278 Other lack of coordination: Secondary | ICD-10-CM | POA: Diagnosis not present

## 2021-10-29 DIAGNOSIS — I69951 Hemiplegia and hemiparesis following unspecified cerebrovascular disease affecting right dominant side: Secondary | ICD-10-CM | POA: Diagnosis not present

## 2021-10-30 DIAGNOSIS — S72141D Displaced intertrochanteric fracture of right femur, subsequent encounter for closed fracture with routine healing: Secondary | ICD-10-CM | POA: Diagnosis not present

## 2021-10-30 DIAGNOSIS — R262 Difficulty in walking, not elsewhere classified: Secondary | ICD-10-CM | POA: Diagnosis not present

## 2021-10-30 DIAGNOSIS — R29898 Other symptoms and signs involving the musculoskeletal system: Secondary | ICD-10-CM | POA: Diagnosis not present

## 2021-10-30 DIAGNOSIS — I69951 Hemiplegia and hemiparesis following unspecified cerebrovascular disease affecting right dominant side: Secondary | ICD-10-CM | POA: Diagnosis not present

## 2021-10-30 DIAGNOSIS — Z9181 History of falling: Secondary | ICD-10-CM | POA: Diagnosis not present

## 2021-10-30 DIAGNOSIS — R278 Other lack of coordination: Secondary | ICD-10-CM | POA: Diagnosis not present

## 2021-11-02 DIAGNOSIS — R262 Difficulty in walking, not elsewhere classified: Secondary | ICD-10-CM | POA: Diagnosis not present

## 2021-11-02 DIAGNOSIS — R278 Other lack of coordination: Secondary | ICD-10-CM | POA: Diagnosis not present

## 2021-11-02 DIAGNOSIS — Z9181 History of falling: Secondary | ICD-10-CM | POA: Diagnosis not present

## 2021-11-02 DIAGNOSIS — R29898 Other symptoms and signs involving the musculoskeletal system: Secondary | ICD-10-CM | POA: Diagnosis not present

## 2021-11-02 DIAGNOSIS — S72141D Displaced intertrochanteric fracture of right femur, subsequent encounter for closed fracture with routine healing: Secondary | ICD-10-CM | POA: Diagnosis not present

## 2021-11-02 DIAGNOSIS — I69951 Hemiplegia and hemiparesis following unspecified cerebrovascular disease affecting right dominant side: Secondary | ICD-10-CM | POA: Diagnosis not present

## 2021-11-02 DIAGNOSIS — M6281 Muscle weakness (generalized): Secondary | ICD-10-CM | POA: Diagnosis not present

## 2021-11-04 DIAGNOSIS — I69951 Hemiplegia and hemiparesis following unspecified cerebrovascular disease affecting right dominant side: Secondary | ICD-10-CM | POA: Diagnosis not present

## 2021-11-04 DIAGNOSIS — R262 Difficulty in walking, not elsewhere classified: Secondary | ICD-10-CM | POA: Diagnosis not present

## 2021-11-04 DIAGNOSIS — S72141D Displaced intertrochanteric fracture of right femur, subsequent encounter for closed fracture with routine healing: Secondary | ICD-10-CM | POA: Diagnosis not present

## 2021-11-04 DIAGNOSIS — R29898 Other symptoms and signs involving the musculoskeletal system: Secondary | ICD-10-CM | POA: Diagnosis not present

## 2021-11-04 DIAGNOSIS — Z9181 History of falling: Secondary | ICD-10-CM | POA: Diagnosis not present

## 2021-11-04 DIAGNOSIS — R278 Other lack of coordination: Secondary | ICD-10-CM | POA: Diagnosis not present

## 2021-11-05 DIAGNOSIS — R29898 Other symptoms and signs involving the musculoskeletal system: Secondary | ICD-10-CM | POA: Diagnosis not present

## 2021-11-05 DIAGNOSIS — S72141D Displaced intertrochanteric fracture of right femur, subsequent encounter for closed fracture with routine healing: Secondary | ICD-10-CM | POA: Diagnosis not present

## 2021-11-05 DIAGNOSIS — R278 Other lack of coordination: Secondary | ICD-10-CM | POA: Diagnosis not present

## 2021-11-05 DIAGNOSIS — I69951 Hemiplegia and hemiparesis following unspecified cerebrovascular disease affecting right dominant side: Secondary | ICD-10-CM | POA: Diagnosis not present

## 2021-11-05 DIAGNOSIS — Z9181 History of falling: Secondary | ICD-10-CM | POA: Diagnosis not present

## 2021-11-05 DIAGNOSIS — R262 Difficulty in walking, not elsewhere classified: Secondary | ICD-10-CM | POA: Diagnosis not present

## 2021-11-09 ENCOUNTER — Other Ambulatory Visit: Payer: Self-pay | Admitting: Internal Medicine

## 2021-11-09 DIAGNOSIS — S72141D Displaced intertrochanteric fracture of right femur, subsequent encounter for closed fracture with routine healing: Secondary | ICD-10-CM | POA: Diagnosis not present

## 2021-11-09 DIAGNOSIS — Z1231 Encounter for screening mammogram for malignant neoplasm of breast: Secondary | ICD-10-CM

## 2021-11-09 DIAGNOSIS — R29898 Other symptoms and signs involving the musculoskeletal system: Secondary | ICD-10-CM | POA: Diagnosis not present

## 2021-11-09 DIAGNOSIS — Z9181 History of falling: Secondary | ICD-10-CM | POA: Diagnosis not present

## 2021-11-09 DIAGNOSIS — I69951 Hemiplegia and hemiparesis following unspecified cerebrovascular disease affecting right dominant side: Secondary | ICD-10-CM | POA: Diagnosis not present

## 2021-11-09 DIAGNOSIS — R262 Difficulty in walking, not elsewhere classified: Secondary | ICD-10-CM | POA: Diagnosis not present

## 2021-11-09 DIAGNOSIS — R278 Other lack of coordination: Secondary | ICD-10-CM | POA: Diagnosis not present

## 2021-11-10 DIAGNOSIS — R262 Difficulty in walking, not elsewhere classified: Secondary | ICD-10-CM | POA: Diagnosis not present

## 2021-11-10 DIAGNOSIS — R278 Other lack of coordination: Secondary | ICD-10-CM | POA: Diagnosis not present

## 2021-11-10 DIAGNOSIS — S72141D Displaced intertrochanteric fracture of right femur, subsequent encounter for closed fracture with routine healing: Secondary | ICD-10-CM | POA: Diagnosis not present

## 2021-11-10 DIAGNOSIS — Z9181 History of falling: Secondary | ICD-10-CM | POA: Diagnosis not present

## 2021-11-10 DIAGNOSIS — I69951 Hemiplegia and hemiparesis following unspecified cerebrovascular disease affecting right dominant side: Secondary | ICD-10-CM | POA: Diagnosis not present

## 2021-11-10 DIAGNOSIS — R29898 Other symptoms and signs involving the musculoskeletal system: Secondary | ICD-10-CM | POA: Diagnosis not present

## 2021-11-11 DIAGNOSIS — R262 Difficulty in walking, not elsewhere classified: Secondary | ICD-10-CM | POA: Diagnosis not present

## 2021-11-11 DIAGNOSIS — R278 Other lack of coordination: Secondary | ICD-10-CM | POA: Diagnosis not present

## 2021-11-11 DIAGNOSIS — R29898 Other symptoms and signs involving the musculoskeletal system: Secondary | ICD-10-CM | POA: Diagnosis not present

## 2021-11-11 DIAGNOSIS — S72141D Displaced intertrochanteric fracture of right femur, subsequent encounter for closed fracture with routine healing: Secondary | ICD-10-CM | POA: Diagnosis not present

## 2021-11-11 DIAGNOSIS — I69951 Hemiplegia and hemiparesis following unspecified cerebrovascular disease affecting right dominant side: Secondary | ICD-10-CM | POA: Diagnosis not present

## 2021-11-11 DIAGNOSIS — Z9181 History of falling: Secondary | ICD-10-CM | POA: Diagnosis not present

## 2021-11-12 DIAGNOSIS — Z9181 History of falling: Secondary | ICD-10-CM | POA: Diagnosis not present

## 2021-11-12 DIAGNOSIS — R278 Other lack of coordination: Secondary | ICD-10-CM | POA: Diagnosis not present

## 2021-11-12 DIAGNOSIS — S72141D Displaced intertrochanteric fracture of right femur, subsequent encounter for closed fracture with routine healing: Secondary | ICD-10-CM | POA: Diagnosis not present

## 2021-11-12 DIAGNOSIS — I69951 Hemiplegia and hemiparesis following unspecified cerebrovascular disease affecting right dominant side: Secondary | ICD-10-CM | POA: Diagnosis not present

## 2021-11-12 DIAGNOSIS — R29898 Other symptoms and signs involving the musculoskeletal system: Secondary | ICD-10-CM | POA: Diagnosis not present

## 2021-11-12 DIAGNOSIS — R262 Difficulty in walking, not elsewhere classified: Secondary | ICD-10-CM | POA: Diagnosis not present

## 2021-11-16 DIAGNOSIS — S72141D Displaced intertrochanteric fracture of right femur, subsequent encounter for closed fracture with routine healing: Secondary | ICD-10-CM | POA: Diagnosis not present

## 2021-11-16 DIAGNOSIS — R278 Other lack of coordination: Secondary | ICD-10-CM | POA: Diagnosis not present

## 2021-11-16 DIAGNOSIS — R262 Difficulty in walking, not elsewhere classified: Secondary | ICD-10-CM | POA: Diagnosis not present

## 2021-11-16 DIAGNOSIS — R29898 Other symptoms and signs involving the musculoskeletal system: Secondary | ICD-10-CM | POA: Diagnosis not present

## 2021-11-16 DIAGNOSIS — Z9181 History of falling: Secondary | ICD-10-CM | POA: Diagnosis not present

## 2021-11-16 DIAGNOSIS — I69951 Hemiplegia and hemiparesis following unspecified cerebrovascular disease affecting right dominant side: Secondary | ICD-10-CM | POA: Diagnosis not present

## 2021-11-17 DIAGNOSIS — R262 Difficulty in walking, not elsewhere classified: Secondary | ICD-10-CM | POA: Diagnosis not present

## 2021-11-17 DIAGNOSIS — Z9181 History of falling: Secondary | ICD-10-CM | POA: Diagnosis not present

## 2021-11-17 DIAGNOSIS — S72141D Displaced intertrochanteric fracture of right femur, subsequent encounter for closed fracture with routine healing: Secondary | ICD-10-CM | POA: Diagnosis not present

## 2021-11-17 DIAGNOSIS — R278 Other lack of coordination: Secondary | ICD-10-CM | POA: Diagnosis not present

## 2021-11-17 DIAGNOSIS — R29898 Other symptoms and signs involving the musculoskeletal system: Secondary | ICD-10-CM | POA: Diagnosis not present

## 2021-11-17 DIAGNOSIS — I69951 Hemiplegia and hemiparesis following unspecified cerebrovascular disease affecting right dominant side: Secondary | ICD-10-CM | POA: Diagnosis not present

## 2021-11-18 DIAGNOSIS — R29898 Other symptoms and signs involving the musculoskeletal system: Secondary | ICD-10-CM | POA: Diagnosis not present

## 2021-11-18 DIAGNOSIS — R262 Difficulty in walking, not elsewhere classified: Secondary | ICD-10-CM | POA: Diagnosis not present

## 2021-11-18 DIAGNOSIS — S72141D Displaced intertrochanteric fracture of right femur, subsequent encounter for closed fracture with routine healing: Secondary | ICD-10-CM | POA: Diagnosis not present

## 2021-11-18 DIAGNOSIS — R278 Other lack of coordination: Secondary | ICD-10-CM | POA: Diagnosis not present

## 2021-11-18 DIAGNOSIS — Z9181 History of falling: Secondary | ICD-10-CM | POA: Diagnosis not present

## 2021-11-18 DIAGNOSIS — I69951 Hemiplegia and hemiparesis following unspecified cerebrovascular disease affecting right dominant side: Secondary | ICD-10-CM | POA: Diagnosis not present

## 2021-11-19 DIAGNOSIS — R278 Other lack of coordination: Secondary | ICD-10-CM | POA: Diagnosis not present

## 2021-11-19 DIAGNOSIS — S72141D Displaced intertrochanteric fracture of right femur, subsequent encounter for closed fracture with routine healing: Secondary | ICD-10-CM | POA: Diagnosis not present

## 2021-11-19 DIAGNOSIS — R262 Difficulty in walking, not elsewhere classified: Secondary | ICD-10-CM | POA: Diagnosis not present

## 2021-11-19 DIAGNOSIS — R29898 Other symptoms and signs involving the musculoskeletal system: Secondary | ICD-10-CM | POA: Diagnosis not present

## 2021-11-19 DIAGNOSIS — Z9181 History of falling: Secondary | ICD-10-CM | POA: Diagnosis not present

## 2021-11-19 DIAGNOSIS — I69951 Hemiplegia and hemiparesis following unspecified cerebrovascular disease affecting right dominant side: Secondary | ICD-10-CM | POA: Diagnosis not present

## 2021-11-24 ENCOUNTER — Ambulatory Visit
Admission: RE | Admit: 2021-11-24 | Discharge: 2021-11-24 | Disposition: A | Discharge status: Home / Self Care | Payer: Medicare Other | Source: Ambulatory Visit | Attending: Internal Medicine | Admitting: Internal Medicine

## 2021-11-24 DIAGNOSIS — R278 Other lack of coordination: Secondary | ICD-10-CM | POA: Diagnosis not present

## 2021-11-24 DIAGNOSIS — Z9181 History of falling: Secondary | ICD-10-CM | POA: Diagnosis not present

## 2021-11-24 DIAGNOSIS — S72141D Displaced intertrochanteric fracture of right femur, subsequent encounter for closed fracture with routine healing: Secondary | ICD-10-CM | POA: Diagnosis not present

## 2021-11-24 DIAGNOSIS — I69951 Hemiplegia and hemiparesis following unspecified cerebrovascular disease affecting right dominant side: Secondary | ICD-10-CM | POA: Diagnosis not present

## 2021-11-24 DIAGNOSIS — R262 Difficulty in walking, not elsewhere classified: Secondary | ICD-10-CM | POA: Diagnosis not present

## 2021-11-24 DIAGNOSIS — Z1231 Encounter for screening mammogram for malignant neoplasm of breast: Secondary | ICD-10-CM

## 2021-11-24 DIAGNOSIS — R29898 Other symptoms and signs involving the musculoskeletal system: Secondary | ICD-10-CM | POA: Diagnosis not present

## 2021-11-25 DIAGNOSIS — Z9181 History of falling: Secondary | ICD-10-CM | POA: Diagnosis not present

## 2021-11-25 DIAGNOSIS — R29898 Other symptoms and signs involving the musculoskeletal system: Secondary | ICD-10-CM | POA: Diagnosis not present

## 2021-11-25 DIAGNOSIS — R262 Difficulty in walking, not elsewhere classified: Secondary | ICD-10-CM | POA: Diagnosis not present

## 2021-11-25 DIAGNOSIS — S72141D Displaced intertrochanteric fracture of right femur, subsequent encounter for closed fracture with routine healing: Secondary | ICD-10-CM | POA: Diagnosis not present

## 2021-11-25 DIAGNOSIS — I69951 Hemiplegia and hemiparesis following unspecified cerebrovascular disease affecting right dominant side: Secondary | ICD-10-CM | POA: Diagnosis not present

## 2021-11-25 DIAGNOSIS — R278 Other lack of coordination: Secondary | ICD-10-CM | POA: Diagnosis not present

## 2021-11-26 DIAGNOSIS — Z9181 History of falling: Secondary | ICD-10-CM | POA: Diagnosis not present

## 2021-11-26 DIAGNOSIS — S72141D Displaced intertrochanteric fracture of right femur, subsequent encounter for closed fracture with routine healing: Secondary | ICD-10-CM | POA: Diagnosis not present

## 2021-11-26 DIAGNOSIS — R29898 Other symptoms and signs involving the musculoskeletal system: Secondary | ICD-10-CM | POA: Diagnosis not present

## 2021-11-26 DIAGNOSIS — R278 Other lack of coordination: Secondary | ICD-10-CM | POA: Diagnosis not present

## 2021-11-26 DIAGNOSIS — I69951 Hemiplegia and hemiparesis following unspecified cerebrovascular disease affecting right dominant side: Secondary | ICD-10-CM | POA: Diagnosis not present

## 2021-11-26 DIAGNOSIS — R262 Difficulty in walking, not elsewhere classified: Secondary | ICD-10-CM | POA: Diagnosis not present

## 2021-11-26 NOTE — Progress Notes (Signed)
Please let patient know her CT chest showed stable right upper lobe pulmonary nodule not significantly changed since the prior CT on 04/15/2021. Aortic atherosclerosis, continue crestor. Mild compression fracture T3. Follow up with PCP if having back pain

## 2021-11-30 ENCOUNTER — Telehealth: Payer: Self-pay | Admitting: Pulmonary Disease

## 2021-11-30 NOTE — Telephone Encounter (Signed)
Called and went over CT results with patient. Nothing further needed

## 2021-12-01 DIAGNOSIS — R262 Difficulty in walking, not elsewhere classified: Secondary | ICD-10-CM | POA: Diagnosis not present

## 2021-12-01 DIAGNOSIS — I69951 Hemiplegia and hemiparesis following unspecified cerebrovascular disease affecting right dominant side: Secondary | ICD-10-CM | POA: Diagnosis not present

## 2021-12-01 DIAGNOSIS — Z9181 History of falling: Secondary | ICD-10-CM | POA: Diagnosis not present

## 2021-12-01 DIAGNOSIS — M6281 Muscle weakness (generalized): Secondary | ICD-10-CM | POA: Diagnosis not present

## 2021-12-01 DIAGNOSIS — S72141D Displaced intertrochanteric fracture of right femur, subsequent encounter for closed fracture with routine healing: Secondary | ICD-10-CM | POA: Diagnosis not present

## 2021-12-02 DIAGNOSIS — I69951 Hemiplegia and hemiparesis following unspecified cerebrovascular disease affecting right dominant side: Secondary | ICD-10-CM | POA: Diagnosis not present

## 2021-12-02 DIAGNOSIS — R262 Difficulty in walking, not elsewhere classified: Secondary | ICD-10-CM | POA: Diagnosis not present

## 2021-12-02 DIAGNOSIS — S72141D Displaced intertrochanteric fracture of right femur, subsequent encounter for closed fracture with routine healing: Secondary | ICD-10-CM | POA: Diagnosis not present

## 2021-12-02 DIAGNOSIS — M6281 Muscle weakness (generalized): Secondary | ICD-10-CM | POA: Diagnosis not present

## 2021-12-02 DIAGNOSIS — Z9181 History of falling: Secondary | ICD-10-CM | POA: Diagnosis not present

## 2021-12-03 ENCOUNTER — Telehealth: Payer: Self-pay | Admitting: Pulmonary Disease

## 2021-12-03 DIAGNOSIS — S72141D Displaced intertrochanteric fracture of right femur, subsequent encounter for closed fracture with routine healing: Secondary | ICD-10-CM | POA: Diagnosis not present

## 2021-12-03 DIAGNOSIS — I69951 Hemiplegia and hemiparesis following unspecified cerebrovascular disease affecting right dominant side: Secondary | ICD-10-CM | POA: Diagnosis not present

## 2021-12-03 DIAGNOSIS — M6281 Muscle weakness (generalized): Secondary | ICD-10-CM | POA: Diagnosis not present

## 2021-12-03 DIAGNOSIS — Z9181 History of falling: Secondary | ICD-10-CM | POA: Diagnosis not present

## 2021-12-03 DIAGNOSIS — R262 Difficulty in walking, not elsewhere classified: Secondary | ICD-10-CM | POA: Diagnosis not present

## 2021-12-03 NOTE — Telephone Encounter (Signed)
Called and spoke with pt letting her know the reason for [facility] call and she verbalized understanding. Nothing further needed.

## 2021-12-07 DIAGNOSIS — S72141D Displaced intertrochanteric fracture of right femur, subsequent encounter for closed fracture with routine healing: Secondary | ICD-10-CM | POA: Diagnosis not present

## 2021-12-07 DIAGNOSIS — M6281 Muscle weakness (generalized): Secondary | ICD-10-CM | POA: Diagnosis not present

## 2021-12-07 DIAGNOSIS — Z9181 History of falling: Secondary | ICD-10-CM | POA: Diagnosis not present

## 2021-12-07 DIAGNOSIS — I69951 Hemiplegia and hemiparesis following unspecified cerebrovascular disease affecting right dominant side: Secondary | ICD-10-CM | POA: Diagnosis not present

## 2021-12-07 DIAGNOSIS — R262 Difficulty in walking, not elsewhere classified: Secondary | ICD-10-CM | POA: Diagnosis not present

## 2021-12-08 DIAGNOSIS — M6281 Muscle weakness (generalized): Secondary | ICD-10-CM | POA: Diagnosis not present

## 2021-12-08 DIAGNOSIS — I69951 Hemiplegia and hemiparesis following unspecified cerebrovascular disease affecting right dominant side: Secondary | ICD-10-CM | POA: Diagnosis not present

## 2021-12-08 DIAGNOSIS — R262 Difficulty in walking, not elsewhere classified: Secondary | ICD-10-CM | POA: Diagnosis not present

## 2021-12-08 DIAGNOSIS — Z9181 History of falling: Secondary | ICD-10-CM | POA: Diagnosis not present

## 2021-12-08 DIAGNOSIS — S72141D Displaced intertrochanteric fracture of right femur, subsequent encounter for closed fracture with routine healing: Secondary | ICD-10-CM | POA: Diagnosis not present

## 2021-12-09 DIAGNOSIS — N200 Calculus of kidney: Secondary | ICD-10-CM | POA: Diagnosis not present

## 2021-12-23 ENCOUNTER — Encounter: Payer: Medicare Other | Admitting: Adult Health

## 2021-12-24 ENCOUNTER — Encounter: Payer: Medicare Other | Admitting: Adult Health

## 2021-12-29 DIAGNOSIS — I639 Cerebral infarction, unspecified: Secondary | ICD-10-CM | POA: Insufficient documentation

## 2021-12-29 DIAGNOSIS — Z01419 Encounter for gynecological examination (general) (routine) without abnormal findings: Secondary | ICD-10-CM | POA: Diagnosis not present

## 2022-02-26 ENCOUNTER — Ambulatory Visit (INDEPENDENT_AMBULATORY_CARE_PROVIDER_SITE_OTHER): Payer: Medicare Other | Admitting: Neurology

## 2022-02-26 ENCOUNTER — Encounter: Payer: Self-pay | Admitting: Neurology

## 2022-02-26 VITALS — BP 143/80 | HR 79 | Ht 65.5 in | Wt 162.0 lb

## 2022-02-26 DIAGNOSIS — I2510 Atherosclerotic heart disease of native coronary artery without angina pectoris: Secondary | ICD-10-CM

## 2022-02-26 DIAGNOSIS — Z8774 Personal history of (corrected) congenital malformations of heart and circulatory system: Secondary | ICD-10-CM

## 2022-02-26 DIAGNOSIS — I2584 Coronary atherosclerosis due to calcified coronary lesion: Secondary | ICD-10-CM

## 2022-02-26 DIAGNOSIS — G43009 Migraine without aura, not intractable, without status migrainosus: Secondary | ICD-10-CM | POA: Diagnosis not present

## 2022-02-26 DIAGNOSIS — G40209 Localization-related (focal) (partial) symptomatic epilepsy and epileptic syndromes with complex partial seizures, not intractable, without status epilepticus: Secondary | ICD-10-CM

## 2022-02-26 DIAGNOSIS — G44209 Tension-type headache, unspecified, not intractable: Secondary | ICD-10-CM

## 2022-02-26 MED ORDER — PREDNISONE 20 MG PO TABS: ORAL_TABLET | ORAL | 0 refills | Status: DC

## 2022-02-26 MED ORDER — TIZANIDINE HCL 2 MG PO TABS: ORAL_TABLET | ORAL | 6 refills | Status: AC

## 2022-02-26 MED ORDER — TIZANIDINE HCL 2 MG PO TABS: ORAL_TABLET | ORAL | 6 refills | Status: DC

## 2022-02-26 NOTE — Progress Notes (Unsigned)
NEUROLOGY FOLLOW UP OFFICE NOTE  Almendra Loria [id number] March 16, 1956  HISTORY OF PRESENT ILLNESS: I had the pleasure of seeing Tonya Thompson in follow-up in the neurology clinic on 02/26/2022.  The patient was last seen 7 months ago for well-controlled seizures and migraines. She is alone in the office today. Records and images were personally reviewed where available.  Since her last visit, she reports overall doing well. She has been seizure-free since 1998 on Topiramate 346m BID. She had an EEG while admitted for altered mental status in the setting of UTI and hyperammonemic encephalopathy with EEG showing spikes in the left centrotemporal region. She states she does not have migraines as long as she stays away from chocolate and cheese. She however reports a different type of pain on the left hemisphere, with no significant relief from Tylenol. She states that this current headache started a few months ago, she took Tylenol and put an ice pack and it subsided for a day or so then came back. She has difficulty saying how often they occur, "not daily," but frequent. She recalls taking a course of Prednisone with her prior neurologist Dr. DMarlou Saand these headaches went away. She has interrupted sleep due to frequent urination, waking up every 2-3 hours. She may take a 15-20 minute nap in the daytime. She is increasing her activities at her ALF, she is part of [employer] that meet once a week, and has started joining [employer] [place]. She is planning on walking more. She always ambulates with her walker, no falls.    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   1. Seizures. She had a hemorrhagic stroke at age 66 then had subsequent strokes after, always  affecting the right side, last was in 1984. She has been on Topiramate 3068mBID for many years, switched to generic in 1999. Her last seizure was in 1998. She was last [facility]. Per notes, she has abnormal EEGs with left hemisphere slowing, maximal left temporal central area with epileptiform activity. She denies any side effects on Topiramate. She denies any staring/unresponsive episodes, gaps in time, olfactory/gustatory hallucinations, myoclonic jerks. She had a normal birth and early development.  There is no history of febrile convulsions, CNS infections such as meningitis/encephalitis, or family history of seizures.  2. Status migrainosus. She has a history of migraines with prn Fioricet for rescue. She reports being migraine-free since 2017, however at that time she was also in status migrainosus with migraines that lasted 8 days. She recalls taking a 1-week prednisone course with Dr. DeMarlou Saith resolution of migraines. This headache started 2 weeks ago, she reports 10/10 throbbing pain in the frontal regions, with tenderness to palpation. She has been nauseated, no vomiting. No visual obscurations. She is sensitive to lights. She was prescribed Prednisone 2056m tab daily for 3 days, then 1 tab daily for 4 days. She is saying she was only taking 1 tablet daily and took it for only 3-4 days because it was not working. Headaches progressively worsened that she has been to the ER 3 times in the past week. In the ER, she has been given IV Reglan/Benadryl, IV Fentanyl, PO Decadron through her PCP, Vicodin from the ER, and most  recently IV Depacon 1071m and magnesium 2 days ago. She had a sample of Nurtec yesterday from PCP office which only made her sleep 2 hours but did not help with headache. She had a CTA head and neck on 07/24/20 which did not show any acute changes. There was a chronic left MCA infarct with small areas of calcific density within  the infarct. There was a cluster of vessels in the left sylvian fissure compatible with residual AVM, appears to drain into the vein of [place]. ESR was normal (11). She states she stopped the Dexamethasone for a few days but took it again this morning. She states that she had done well with migraines by staying away from food triggers, she thinks there may have been something in her food that set this off. She manages her medications and denies missing doses. She has some soreness in her neck. She takes Baclofen for leg spasticity. She used to get 8 hours of sleep, but since headaches recurred, she only gets 5 hours.   Update 07/29/21: She presents for an earlier visit after hospitalization from 3/12-3/17/2023 for altered mental status. She recently had hip surgery for a hip fracture, then after arriving to the facility, she was more confused and sent back to the ER. She was treated for a UTI with urine culture showing E.coli and E. Cloacae. 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 She had an overnight EEG which showed spikes in the left centrotemporal region, focal slowing in the left temporal region and diffuse slowing, no seizures seen. Levetiracetam was stopped. She was discharged on home dose of Topiramate 3068mBID. She had initially presented in status migrainosus in 07/2020, at which time Depakote was added. She states today that she does not have migraines as long as she avoids her food triggers. She denies any significant headaches and uses an ice pack in the back of her head as needed. She feels her thinking is clearer, speech today is fluent, she is able to report that she is feeling a little down due to her father's passing before she had her hip fracture. She feels she is overall doing better, doing  really well with therapy. She takes oxycodone every 4-6 hours for post-surgical pain. She wakes up every so often at night and naps during the day.    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     baclofen (LIORESAL) 10 MG tablet Take 10 mg by mouth at bedtime.     Calcium Carbonate 500 MG CHEW Chew 500 mg by mouth daily.     Calcium Citrate-Vitamin D 315-5 MG-MCG TABS Take 1 tablet by mouth daily.     fexofenadine (ALLEGRA) 180 MG tablet Take 180 mg by mouth daily.     guaiFENesin (MUCINEX) 600 MG 12 hr tablet Take 600 mg by mouth 2 (two) times daily as needed for cough.     Multiple Vitamins-Minerals (THEREMS-M) TABS Take 1 tablet by mouth daily.     nystatin (MYCOSTATIN/NYSTOP) powder Apply 1 application. topically 2 (two) times daily as needed (rash under breast).     Omega-3 Fatty Acids (OMEGA-3 FISH OIL PO) Take 1 capsule by mouth daily.     polyethylene glycol (MIRALAX / GLYCOLAX) 17 g packet Take 17 g by mouth daily as needed for mild constipation. 14 each 0   Propylene Glycol (SYSTANE BALANCE) 0.6 % SOLN Place 1 drop  into both eyes 2 (two) times daily.     rosuvastatin (CRESTOR) 10 MG tablet Take 1 tablet (10 mg total) by mouth daily. 90 tablet 3   senna (SENOKOT) 8.6 MG TABS tablet Take 1 tablet (8.6 mg total) by mouth 2 (two) times daily. 120 tablet 0   topiramate (TOPAMAX) 100 MG tablet Take 300 mg by mouth 2 (two) times daily.     Vitamin D3 (VITAMIN D) 25 MCG tablet Take 1,000 Units by mouth daily.     zinc oxide 20 % ointment Apply 1 application. topically as needed (after every incontinent episode).     Docosahexaenoic Acid (DHA NATURAL OMEGA-3 PO) Take 1 capsule by mouth daily. Omega-3 DHA (DR/EC) 325m-325mg-90mg-597mg     enoxaparin (LOVENOX) 30 MG/0.3ML injection Inject 0.3 mLs (30 mg total) into the skin daily. 9 mL 0   No current facility-administered medications on file prior to visit.    ALLERGIES: Allergies  Allergen Reactions   Aspirin Other (See Comments)    CONTRAINDICATED DUE TO HISTORY OF BLEEDING IN BRAIN   Cheese     Cant take due to history of severe migraines   Chocolate     Cant take due to history of severe migraines   Coffee Flavor     Cant take due to history of severe migraines   Other      Nuts - Cant take due to history of severe migraines   Red Wine Complex [Germanium]     Cant take due to history of severe migraines    FAMILY HISTORY: Family History  Problem Relation Age of Onset   Heart disease Mother    Heart failure Mother    Heart attack Mother    Diabetes Father    Hyperlipidemia Father    Dementia Father    Prostate cancer Father    Melanoma Father    Colon polyps Father    Hyperlipidemia Paternal Uncle    Hypertension Paternal Uncle    Dementia Paternal Uncle    Heart attack Maternal Grandfather    Stroke Paternal Grandmother    CAD Maternal Uncle    COPD Maternal Uncle        smoker   Breast cancer Neg Hx    Colon cancer Neg Hx    Esophageal cancer Neg Hx    Stomach cancer Neg Hx    Rectal cancer Neg Hx     SOCIAL HISTORY: Social  History   Socioeconomic History   Marital status: Single    Spouse name: Not on file   Number of children: Not on file   Years of education: Not on file   Highest education level: Not on file  Occupational History   Not on file  Tobacco Use   Smoking status: Never    Passive exposure: Past   Smokeless tobacco: Never  Vaping Use   Vaping Use: Never used  Substance and Sexual Activity   Alcohol use: No   Drug use: No   Sexual activity: Not Currently    Birth control/protection: None    Comment: 1st intercourse 66 yo-Fewer than 5 partners  Other Topics Concern   Not on file  Social History Narrative   Right handed until 3rd grade    Left handed   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    Social Determinants of Health   Financial Resource Strain: Not on file  Food Insecurity: Not on file  Transportation Needs: Not on file  Physical Activity: Inactive (08/22/2018)   Exercise Vital Sign    Days of Exercise per Week: 0 days    Minutes of Exercise per Session: 0 min  Stress: No Stress Concern Present (08/22/2018)   [place]    Feeling of Stress : Only a little  Social Connections: Not on file  Intimate Partner Violence: Not on file     PHYSICAL EXAM: Vitals:   02/26/22 1325  BP: (!) 143/80  Pulse: 79  SpO2: 99%   General: No acute distress Head:  Normocephalic/atraumatic Skin/Extremities: No rash, no edema Neurological Exam: alert and oriented to person, place, and time. No aphasia or dysarthria. Fund of knowledge is appropriate.  Recent and remote memory are intact.  Attention and concentration are normal.   Cranial nerves: Pupils equal, round. Extraocular movements intact with no nystagmus. Visual fields full.  No facial asymmetry.  Motor: Bulk and tone normal, muscle strength 5/5 throughout with no pronator drift.   Finger to nose testing intact.  Gait narrow-based and steady, able to tandem walk  adequately.  Romberg negative.  Motor: increased tone on right UE and LE, right foot in AFO. Spastic contracture on right UE with 2-3/5 proximal right UE and LE, 0/5 wrist extension. Left 5/5. Gait not tested, sitting on wheelchair. There is again slight postural and endpoint left hand tremor.    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 MRI/MRV/CTA in 08/2020 no acute changes. Depakote was added to Topiramate at that time, however she recently had a hip fracture then was back in the hospital for altered mental status. 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 Repeat brain MRI no acute changes, EEG no ongoing seizure activity. She is back to her home dose of Topiramate 361m BID and denies any significant headaches. Speech today is fluent, no confusion noted. Continue current course, continue physical therapy. Follow-up in 6 months, call for any changes.     Thank you for allowing me to participate in *** care.  Please do not hesitate to call for any questions or concerns.  The duration of this appointment visit was *** minutes of face-to-face time with the patient.  Greater than 50% of this time was  spent in counseling, explanation of diagnosis, planning of further management, and coordination of care.   Ellouise Newer, M.D.   CC: ***

## 2022-02-26 NOTE — Patient Instructions (Signed)
Good to see you.  Continue Topiramate '300mg'$  twice a day  2. Take the 1-week course of Prednisone to break this headache cycle: Prednisone '20mg'$ : Take 3 tabs on day 1, then 2 and 1/2 tabs on day 2,  Then 2 tabs on day 3, then 1 and 1/2 tabs on day 4,  Then 1 tab on day 5, then 1/2 tab on days 6 and 7, then stop.   3. Instead of Tylenol, take Tizanidine '2mg'$  as needed once a day for headache  4. Follow-up in 6 months, call for any changes

## 2022-03-02 DIAGNOSIS — M2042 Other hammer toe(s) (acquired), left foot: Secondary | ICD-10-CM | POA: Diagnosis not present

## 2022-03-02 DIAGNOSIS — B351 Tinea unguium: Secondary | ICD-10-CM | POA: Diagnosis not present

## 2022-03-10 ENCOUNTER — Telehealth: Payer: Self-pay

## 2022-03-11 ENCOUNTER — Ambulatory Visit: Payer: Medicare Other | Admitting: Pulmonary Disease

## 2022-03-26 NOTE — Telephone Encounter (Signed)
Made in error

## 2022-04-12 DIAGNOSIS — H2513 Age-related nuclear cataract, bilateral: Secondary | ICD-10-CM | POA: Diagnosis not present

## 2022-04-15 ENCOUNTER — Encounter: Payer: Self-pay | Admitting: Pulmonary Disease

## 2022-04-15 ENCOUNTER — Telehealth: Payer: Self-pay | Admitting: Pulmonary Disease

## 2022-04-15 ENCOUNTER — Ambulatory Visit (INDEPENDENT_AMBULATORY_CARE_PROVIDER_SITE_OTHER): Payer: Medicare Other | Admitting: Pulmonary Disease

## 2022-04-15 VITALS — BP 116/90 | HR 69 | Ht 64.0 in | Wt 163.2 lb

## 2022-04-15 DIAGNOSIS — Z8673 Personal history of transient ischemic attack (TIA), and cerebral infarction without residual deficits: Secondary | ICD-10-CM

## 2022-04-15 DIAGNOSIS — R911 Solitary pulmonary nodule: Secondary | ICD-10-CM | POA: Diagnosis not present

## 2022-04-15 DIAGNOSIS — Z789 Other specified health status: Secondary | ICD-10-CM | POA: Diagnosis not present

## 2022-04-15 NOTE — Patient Instructions (Addendum)
Thank you for visiting Dr. Valeta Harms at [facility]. Today we recommend the following:  [facility] blood test completed today  Pending results we will likely plan on repeat ct chest in 6 months (June 2024)  We will set up a virtual visi/phone call once your lab result is back     Please do your part to reduce the spread of COVID-19.

## 2022-04-15 NOTE — Progress Notes (Signed)
Synopsis: Referred in Nov 2022 for lung nodule by Virgie Dad, MD  Subjective:   PATIENT ID: Tonya Thompson GENDER: female DOB: 02-17-1956, MRN: [id number]  Chief Complaint  Patient presents with   Follow-up    This is a 66 year old female, past medical history of multiple CVAs, history of COVID-19, gastroesophageal reflux, seizure disorder she has had a total of at least 5 or 6 strokes most of them were when she was younger.  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  She lives in assisted living at this point.  She originally had a CT scan in April 2022 which revealed a right upper lobe subsolid lesion.  She had subsequent CT scan follow-up which revealed persistence of this lesion last month.  She was referred from her primary care provider for evaluation of this right upper lobe nodule to discuss next steps.  She has secondhand smoke exposure from her mother when she lived with her several years ago.  Overall lifelong non-smoker.  OV 04/15/2022: Here today for follow-up regarding a right upper lobe pulmonary nodule.  She had this subsolid groundglass lesion with a right upper lobe previously biopsied negative for malignancy.  Not lifelong non-smoker.  Subsequent CT imaging follow-up in June 2023 that shows stability of the lesion.  She still anxious about this she does have significant secondhand smoke exposure from her mother.     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-last 1998   Stroke [facility])    5 total so far (age  [phone])   Ureteral stone 05/10/2019   has multiple kidney stones noted from Urologist   Uses roller walker      Family History  Problem Relation Age of Onset   Heart disease Mother    Heart failure Mother    Heart attack Mother    Diabetes Father    Hyperlipidemia Father    Dementia Father    Prostate cancer Father    Melanoma Father    Colon polyps Father    Hyperlipidemia Paternal Uncle    Hypertension Paternal Uncle    Dementia Paternal Uncle    Heart attack Maternal Grandfather    Stroke Paternal Grandmother    CAD Maternal Uncle    COPD Maternal Uncle        smoker   Breast cancer Neg Hx    Colon cancer Neg Hx    Esophageal cancer Neg Hx    Stomach cancer Neg Hx    Rectal cancer Neg Hx      Past Surgical History:  Procedure Laterality Date   BRAIN SURGERY  09/19/1978   at [facility], Dr. Leida Lauth   BRONCHIAL BIOPSY  04/21/2021   Procedure: BRONCHIAL BIOPSIES;  Surgeon: Garner Nash, DO;  Location: [place] [facility];  Service: Pulmonary;;   BRONCHIAL BRUSHINGS  04/21/2021   Procedure: BRONCHIAL BRUSHINGS;  Surgeon: Garner Nash, DO;  Location: [place] [facility];  Service: Pulmonary;;   BRONCHIAL WASHINGS  04/21/2021  Procedure: BRONCHIAL WASHINGS;  Surgeon: Garner Nash, DO;  Location: [place];  Service: Pulmonary;;   COLONOSCOPY  2017   [place], [place]   CYSTOSCOPY/URETEROSCOPY/HOLMIUM LASER/STENT PLACEMENT Left 05/10/2019   Procedure: CYSTOSCOPY/RETROGRADE/URETEROSCOPY/HOLMIUM LASER/STENT PLACEMENT;  Surgeon: Raynelle Bring, MD;  Location: [facility];  Service: Urology;  Laterality: Left;   FIDUCIAL MARKER PLACEMENT  04/21/2021   Procedure: FIDUCIAL MARKER PLACEMENT;  Surgeon: Garner Nash, DO;  Location: [place] [facility];  Service: Pulmonary;;   FOOT SURGERY Right 10/1985   ankle fusion, at [facility], Dr. Lorelee Cover   INTRAMEDULLARY (IM) NAIL INTERTROCHANTERIC Right 07/09/2021   Procedure: INTRAMEDULLARY (IM) NAIL INTERTROCHANTRIC;  Surgeon: Rod Can, MD;   Location: [facility];  Service: Orthopedics;  Laterality: Right;   POLYPECTOMY  2017   multiple adenomas   TUBAL LIGATION  1981   VIDEO BRONCHOSCOPY WITH RADIAL ENDOBRONCHIAL ULTRASOUND  04/21/2021   Procedure: RADIAL ENDOBRONCHIAL ULTRASOUND;  Surgeon: Garner Nash, DO;  Location: [facility];  Service: Pulmonary;;   WISDOM TOOTH EXTRACTION      Social History   Socioeconomic History   Marital status: Single    Spouse name: Not on file   Number of children: Not on file   Years of education: Not on file   Highest education level: Not on file  Occupational History   Not on file  Tobacco Use   Smoking status: Never    Passive exposure: Past   Smokeless tobacco: Never  Vaping Use   Vaping Use: Never used  Substance and Sexual Activity   Alcohol use: No   Drug use: No   Sexual activity: Not Currently    Birth control/protection: None    Comment: 1st intercourse 66 yo-Fewer than 5 partners  Other Topics Concern   Not on file  Social History Narrative   Right handed until 3rd grade    Left handed   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    Social Determinants of Health   Financial Resource Strain: Not on file  Food Insecurity: Not on file  Transportation Needs: Not on file  Physical Activity: Inactive (08/22/2018)   Exercise Vital Sign    Days of Exercise per Week: 0 days    Minutes of Exercise per Session: 0 min  Stress: No Stress Concern Present (08/22/2018)   [place]    Feeling of Stress : Only a little  Social Connections: Not on file  Intimate Partner Violence: Not on file     Allergies  Allergen Reactions   Aspirin Other (See Comments)    CONTRAINDICATED DUE TO HISTORY OF BLEEDING IN BRAIN   Cheese     Cant take due to history of severe migraines   Chocolate     Cant take due to history of severe migraines   Coffee Flavor     Cant take due to history of severe migraines   Other       Nuts - Cant take due to history of severe migraines   Red Wine Complex [Germanium]     Cant take due to history of severe migraines     Outpatient Medications Prior to Visit  Medication Sig Dispense Refill   fexofenadine (ALLEGRA) 180 MG tablet Take 180 mg by mouth daily.     guaiFENesin (MUCINEX) 600 MG 12 hr tablet Take 600 mg by mouth 2 (two) times daily as needed for cough.     predniSONE (DELTASONE) 20 MG tablet Take 3 tabs on day  1, 2 and 1/2 tabs on day 2, 2 tabs on day 3, 1 and 1/2 tabs on day 4, 1 tab on day 5, 1/2 tab on days 6 and 7, then stop. 11 tablet 0   acetaminophen (TYLENOL) 325 MG tablet Take 650 mg by mouth every 4 (four) hours as needed for headache or mild pain.     baclofen (LIORESAL) 10 MG tablet Take 10 mg by mouth at bedtime.     Calcium Carbonate 500 MG CHEW Chew 500 mg by mouth daily.     Calcium Citrate-Vitamin D 315-5 MG-MCG TABS Take 1 tablet by mouth daily.     Docosahexaenoic Acid (DHA NATURAL OMEGA-3 PO) Take 1 capsule by mouth daily. Omega-3 DHA (DR/EC) '350mg'$ -'325mg'$ -'90mg'$ -'597mg'$      enoxaparin (LOVENOX) 30 MG/0.3ML injection Inject 0.3 mLs (30 mg total) into the skin daily. 9 mL 0   Multiple Vitamins-Minerals (THEREMS-M) TABS Take 1 tablet by mouth daily.     nystatin (MYCOSTATIN/NYSTOP) powder Apply 1 application. topically 2 (two) times daily as needed (rash under breast).     Omega-3 Fatty Acids (OMEGA-3 FISH OIL PO) Take 1 capsule by mouth daily.     polyethylene glycol (MIRALAX / GLYCOLAX) 17 g packet Take 17 g by mouth daily as needed for mild constipation. 14 each 0   Propylene Glycol (SYSTANE BALANCE) 0.6 % SOLN Place 1 drop into both eyes 2 (two) times daily.     rosuvastatin (CRESTOR) 10 MG tablet Take 1 tablet (10 mg total) by mouth daily. 90 tablet 3   senna (SENOKOT) 8.6 MG TABS tablet Take 1 tablet (8.6 mg total) by mouth 2 (two) times daily. 120 tablet 0   tiZANidine (ZANAFLEX) 2 MG tablet Take 1 tablet as needed once a day for headache (Give  Tizanidine instead of Tylenol) 30 tablet 6   topiramate (TOPAMAX) 100 MG tablet Take 300 mg by mouth 2 (two) times daily.     Vitamin D3 (VITAMIN D) 25 MCG tablet Take 1,000 Units by mouth daily.     zinc oxide 20 % ointment Apply 1 application. topically as needed (after every incontinent episode).     No facility-administered medications prior to visit.    Review of Systems  Constitutional:  Negative for chills, fever, malaise/fatigue and weight loss.  HENT:  Negative for hearing loss, sore throat and tinnitus.   Eyes:  Negative for blurred vision and double vision.  Respiratory:  Negative for cough, hemoptysis, sputum production, shortness of breath, wheezing and stridor.   Cardiovascular:  Negative for chest pain, palpitations, orthopnea, leg swelling and PND.  Gastrointestinal:  Negative for abdominal pain, constipation, diarrhea, heartburn, nausea and vomiting.  Genitourinary:  Negative for dysuria, hematuria and urgency.  Musculoskeletal:  Negative for joint pain and myalgias.  Skin:  Negative for itching and rash.  Neurological:  Negative for dizziness, tingling, weakness and headaches.  Endo/Heme/Allergies:  Negative for environmental allergies. Does not bruise/bleed easily.  Psychiatric/Behavioral:  Negative for depression. The patient is not nervous/anxious and does not have insomnia.   All other systems reviewed and are negative.    Objective:  Physical Exam Vitals reviewed.  Constitutional:      General: She is not in acute distress.    Appearance: She is well-developed.  HENT:     Head: Normocephalic and atraumatic.  Eyes:     General: No scleral icterus.    Conjunctiva/sclera: Conjunctivae normal.     Pupils: Pupils are equal, round, and reactive to light.  Neck:  Vascular: No JVD.     Trachea: No tracheal deviation.  Cardiovascular:     Rate and Rhythm: Normal rate and regular rhythm.     Heart sounds: Normal heart sounds. No murmur heard. Pulmonary:      Effort: Pulmonary effort is normal. No tachypnea, accessory muscle usage or respiratory distress.     Breath sounds: No stridor. No wheezing, rhonchi or rales.  Abdominal:     General: There is no distension.     Palpations: Abdomen is soft.     Tenderness: There is no abdominal tenderness.  Musculoskeletal:        General: No tenderness.     Cervical back: Neck supple.     Comments: Walks with rolling walker  Lymphadenopathy:     Cervical: No cervical adenopathy.  Skin:    General: Skin is warm and dry.     Capillary Refill: Capillary refill takes less than 2 seconds.     Findings: No rash.  Neurological:     Mental Status: She is alert and oriented to person, place, and time.     Gait: Gait abnormal.     Comments: Slowed speech following history of strokes  Psychiatric:        Behavior: Behavior normal.      Vitals:   04/15/22 0933  BP: (!) 116/90  Pulse: 69  SpO2: 97%  Weight: 163 lb 3.2 oz (74 kg)  Height: '5\' 4"'$  (1.626 m)   97% on RA BMI Readings from Last 3 Encounters:  04/15/22 28.01 kg/m  02/26/22 26.55 kg/m  07/29/21 25.30 kg/m   Wt Readings from Last 3 Encounters:  04/15/22 163 lb 3.2 oz (74 kg)  02/26/22 162 lb (73.5 kg)  07/29/21 147 lb 6.4 oz (66.9 kg)     CBC    Component Value Date/Time   WBC 10.3 07/16/2021 0414   RBC 3.04 (L) 07/16/2021 0414   HGB 10.8 (L) 07/16/2021 0414   HCT 31.7 (L) 07/16/2021 0414   PLT 159 07/16/2021 0414   MCV 104.3 (H) 07/16/2021 0414   MCH 35.5 (H) 07/16/2021 0414   MCHC 34.1 07/16/2021 0414   RDW 13.4 07/16/2021 0414   LYMPHSABS 1.2 07/13/2021 0304   MONOABS 1.5 (H) 07/13/2021 0304   EOSABS 0.0 07/13/2021 0304   BASOSABS 0.0 07/13/2021 0304     Chest Imaging: 02/03/2021 CT chest: Persistent subsolid pulmonary nodule within the right upper lobe concerning for possible stage I malignancy. The patient's images have been independently reviewed by me.    June 2023 CT chest: Persistence of the lesion within  the right upper lobe.  Stable, no significant growth or change. Still suggestive and morphology concerning for malignancy. The patient's images have been independently reviewed by me.    Pulmonary Functions Testing Results:     No data to display          FeNO:   Pathology:   Echocardiogram:   Heart Catheterization:     Assessment & Plan:     ICD-10-CM   1. Right upper lobe pulmonary nodule  R91.1 CT Super D Chest Wo Contrast    2. History of CVA (cerebrovascular accident)  Z86.73     3. Lung nodule  R91.1     4. Nonsmoker  Z78.9        Discussion:  This is a 66 year old female, past medical history of multiple strokes she does have some debility at baseline works with walks with a rollator walker.  She does have  a seizure disorder.  She has a persistent right upper lobe subsolid lesion that was concerning for malignancy.  Under watchful waiting and decision was made for biopsy.  Biopsy was negative for malignancy.  Plan: Today in the office we talked about protein-based blood testing for restratification. We will send this blood test today to see if this helps with our decision making on considerations for rebiopsy. Patient would like to have this done if she is still anxious about the lesion. She does qualify based on risk of less than 50% malignancy from our nodule risk calculator. We reviewed this today in the office with patient. We will set up a virtual visit once we have her lab tests back to review next steps. If low risk we will consider repeat noncontrasted CT scan of the chest in 6 months for June 2024. If high risk we will consider repeat biopsy if patient is willing to undergo.    Current Outpatient Medications:    fexofenadine (ALLEGRA) 180 MG tablet, Take 180 mg by mouth daily., Disp: , Rfl:    guaiFENesin (MUCINEX) 600 MG 12 hr tablet, Take 600 mg by mouth 2 (two) times daily as needed for cough., Disp: , Rfl:    predniSONE (DELTASONE) 20 MG  tablet, Take 3 tabs on day 1, 2 and 1/2 tabs on day 2, 2 tabs on day 3, 1 and 1/2 tabs on day 4, 1 tab on day 5, 1/2 tab on days 6 and 7, then stop., Disp: 11 tablet, Rfl: 0   acetaminophen (TYLENOL) 325 MG tablet, Take 650 mg by mouth every 4 (four) hours as needed for headache or mild pain., Disp: , Rfl:    baclofen (LIORESAL) 10 MG tablet, Take 10 mg by mouth at bedtime., Disp: , Rfl:    Calcium Carbonate 500 MG CHEW, Chew 500 mg by mouth daily., Disp: , Rfl:    Calcium Citrate-Vitamin D 315-5 MG-MCG TABS, Take 1 tablet by mouth daily., Disp: , Rfl:    Docosahexaenoic Acid (DHA NATURAL OMEGA-3 PO), Take 1 capsule by mouth daily. Omega-3 DHA (DR/EC) '350mg'$ -'325mg'$ -'90mg'$ -'597mg'$ , Disp: , Rfl:    enoxaparin (LOVENOX) 30 MG/0.3ML injection, Inject 0.3 mLs (30 mg total) into the skin daily., Disp: 9 mL, Rfl: 0   Multiple Vitamins-Minerals (THEREMS-M) TABS, Take 1 tablet by mouth daily., Disp: , Rfl:    nystatin (MYCOSTATIN/NYSTOP) powder, Apply 1 application. topically 2 (two) times daily as needed (rash under breast)., Disp: , Rfl:    Omega-3 Fatty Acids (OMEGA-3 FISH OIL PO), Take 1 capsule by mouth daily., Disp: , Rfl:    polyethylene glycol (MIRALAX / GLYCOLAX) 17 g packet, Take 17 g by mouth daily as needed for mild constipation., Disp: 14 each, Rfl: 0   Propylene Glycol (SYSTANE BALANCE) 0.6 % SOLN, Place 1 drop into both eyes 2 (two) times daily., Disp: , Rfl:    rosuvastatin (CRESTOR) 10 MG tablet, Take 1 tablet (10 mg total) by mouth daily., Disp: 90 tablet, Rfl: 3   senna (SENOKOT) 8.6 MG TABS tablet, Take 1 tablet (8.6 mg total) by mouth 2 (two) times daily., Disp: 120 tablet, Rfl: 0   tiZANidine (ZANAFLEX) 2 MG tablet, Take 1 tablet as needed once a day for headache (Give Tizanidine instead of Tylenol), Disp: 30 tablet, Rfl: 6   topiramate (TOPAMAX) 100 MG tablet, Take 300 mg by mouth 2 (two) times daily., Disp: , Rfl:    Vitamin D3 (VITAMIN D) 25 MCG tablet, Take 1,000 Units by mouth daily.,  Disp: , Rfl:    zinc oxide 20 % ointment, Apply 1 application. topically as needed (after every incontinent episode)., Disp: , Rfl:     Garner Nash, DO [facility] 04/15/2022 9:56 AM

## 2022-04-15 NOTE — Telephone Encounter (Signed)
Called and told patient that as soon as we get the lab work results back we will call her and update her with results. Nothing further needed

## 2022-04-16 DIAGNOSIS — R911 Solitary pulmonary nodule: Secondary | ICD-10-CM | POA: Diagnosis not present

## 2022-04-21 DIAGNOSIS — R911 Solitary pulmonary nodule: Secondary | ICD-10-CM | POA: Diagnosis not present

## 2022-04-23 ENCOUNTER — Telehealth: Payer: Self-pay

## 2022-04-23 NOTE — Telephone Encounter (Signed)
Called and left detailed message about [facility] bloodwork results. Will call to follow up.

## 2022-04-27 ENCOUNTER — Non-Acute Institutional Stay (SKILLED_NURSING_FACILITY): Payer: Medicare Other | Admitting: Family Medicine

## 2022-04-27 DIAGNOSIS — R269 Unspecified abnormalities of gait and mobility: Secondary | ICD-10-CM | POA: Diagnosis not present

## 2022-04-27 DIAGNOSIS — R519 Headache, unspecified: Secondary | ICD-10-CM

## 2022-04-27 DIAGNOSIS — N1832 Chronic kidney disease, stage 3b: Secondary | ICD-10-CM

## 2022-04-27 DIAGNOSIS — E782 Mixed hyperlipidemia: Secondary | ICD-10-CM | POA: Diagnosis not present

## 2022-04-27 DIAGNOSIS — R4189 Other symptoms and signs involving cognitive functions and awareness: Secondary | ICD-10-CM | POA: Diagnosis not present

## 2022-04-27 NOTE — Progress Notes (Signed)
Provider:  Alain Honey, MD Location:      Place of Service:     PCP: Mast, Man X, NP Patient Care Team: Mast, Man X, NP as PCP - General (Internal Medicine) Werner Lean, MD as PCP - Cardiology (Cardiology) Cameron Sprang, MD as Consulting Physician (Neurology) Corliss Parish, MD as Consulting Physician (Nephrology) Raynelle Bring, MD as Consulting Physician (Urology)  Extended Emergency Contact Information Primary Emergency Contact: Barnie Del [place] of [place] Phone: [phone] Mobile Phone: [phone] Relation: Relative Secondary Emergency Contact: Roper,Paula Address: [place] [address] Johnnette Litter of [place] Phone: [phone] Mobile Phone: [phone] Relation: Sister  Code Status:  Goals of Care: Advanced Directive information    02/26/2022    1:31 PM  Advanced Directives  Does Patient Have a Medical Advance Directive? Yes  Type of Paramedic of [place];Living will      No chief complaint on file.  HPI: Patient is a 66 y.o. 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  There are definite triggers related to her migraine and as long as she avoids foods that are the triggers there are no headaches.  Is followed by neurology. She had a fall back in March resulting in femoral fracture.  She ambulates with walker now and does well.  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) 09/03/2019   05-2019 kidney stone removed   Allergy    seasonal allergies   Arthritis    LEFT hand   AVM (arteriovenous malformation) brain     Cataract    bilateral-not a surgical candidate at this time (07/23/2020)   COVID-19 virus infection 08/2020   GERD (gastroesophageal reflux disease)    on meds   Hyperlipidemia    diet controlled   Insulin resistance 02/16/2018   Seizures ([place])    Grand Mal & Partial complex seizure disorder-last 1998   Stroke [facility])    5 total so far (age [phone])   Ureteral stone 05/10/2019   has multiple kidney stones noted from Urologist   Uses roller walker    Past Surgical History:  Procedure Laterality Date   BRAIN SURGERY  09/19/1978   at [facility], Dr. Leida Lauth   BRONCHIAL BIOPSY  04/21/2021   Procedure: BRONCHIAL BIOPSIES;  Surgeon: Garner Nash, DO;  Location: [place] [facility];  Service: Pulmonary;;   BRONCHIAL BRUSHINGS  04/21/2021   Procedure: BRONCHIAL BRUSHINGS;  Surgeon: Garner Nash, DO;  Location: [facility] [facility];  Service: Pulmonary;;   BRONCHIAL WASHINGS  04/21/2021   Procedure: BRONCHIAL WASHINGS;  Surgeon: Garner Nash, DO;  Location: [place];  Service: Pulmonary;;   COLONOSCOPY  2017   [place] [place]   CYSTOSCOPY/URETEROSCOPY/HOLMIUM LASER/STENT PLACEMENT Left 05/10/2019   Procedure: CYSTOSCOPY/RETROGRADE/URETEROSCOPY/HOLMIUM LASER/STENT PLACEMENT;  Surgeon: Raynelle Bring, MD;  Location: [facility];  Service: Urology;  Laterality: Left;   FIDUCIAL MARKER PLACEMENT  04/21/2021   Procedure: FIDUCIAL MARKER PLACEMENT;  Surgeon: Garner Nash, DO;  Location: [facility];  Service: Pulmonary;;   FOOT SURGERY Right 10/1985   ankle fusion, at [facility], Dr. Lorelee Cover   INTRAMEDULLARY (IM)  NAIL INTERTROCHANTERIC Right 07/09/2021   Procedure: INTRAMEDULLARY (IM) NAIL INTERTROCHANTRIC;  Surgeon: Rod Can, MD;  Location: [facility];  Service: Orthopedics;  Laterality: Right;   POLYPECTOMY  2017   multiple adenomas   TUBAL LIGATION  1981   VIDEO BRONCHOSCOPY WITH RADIAL ENDOBRONCHIAL ULTRASOUND  04/21/2021   Procedure: RADIAL ENDOBRONCHIAL ULTRASOUND;  Surgeon: Garner Nash, DO;   Location: [place] [facility];  Service: Pulmonary;;   WISDOM TOOTH EXTRACTION      reports that she has never smoked. She has been exposed to tobacco smoke. She has never used smokeless tobacco. She reports that she does not drink alcohol and does not use drugs. Social History   Socioeconomic History   Marital status: Single    Spouse name: Not on file   Number of children: Not on file   Years of education: Not on file   Highest education level: Not on file  Occupational History   Not on file  Tobacco Use   Smoking status: Never    Passive exposure: Past   Smokeless tobacco: Never  Vaping Use   Vaping Use: Never used  Substance and Sexual Activity   Alcohol use: No   Drug use: No   Sexual activity: Not Currently    Birth control/protection: None    Comment: 1st intercourse 66 yo-Fewer than 5 partners  Other Topics Concern   Not on file  Social History Narrative   Right handed until 3rd grade    Left handed   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    Social Determinants of Health   Financial Resource Strain: Not on file  Food Insecurity: Not on file  Transportation Needs: Not on file  Physical Activity: Inactive (08/22/2018)   Exercise Vital Sign    Days of Exercise per Week: 0 days    Minutes of Exercise per Session: 0 min  Stress: No Stress Concern Present (08/22/2018)   [place]    Feeling of Stress : Only a little  Social Connections: Not on file  Intimate Partner Violence: Not on file    Functional Status Survey:    Family History  Problem Relation Age of Onset   Heart disease Mother    Heart failure Mother    Heart attack Mother    Diabetes Father    Hyperlipidemia Father    Dementia Father    Prostate cancer Father    Melanoma Father    Colon polyps Father    Hyperlipidemia Paternal Uncle    Hypertension Paternal Uncle    Dementia Paternal Uncle    Heart attack Maternal Grandfather     Stroke Paternal Grandmother    CAD Maternal Uncle    COPD Maternal Uncle        smoker   Breast cancer Neg Hx    Colon cancer Neg Hx    Esophageal cancer Neg Hx    Stomach cancer Neg Hx    Rectal cancer Neg Hx     Health Maintenance  Topic Date Due   Zoster Vaccines- Shingrix (1 of 2) Never done   Medicare Annual Wellness (AWV)  12/24/2016   PAP SMEAR-Modifier  12/06/2020   INFLUENZA VACCINE  12/01/2021   COVID-19 Vaccine (6 - 2023-24 season) 01/01/2022   MAMMOGRAM  11/25/2023   COLONOSCOPY (Pts 45-60yr Insurance coverage will need to be confirmed)  01/09/2024   DTaP/Tdap/Td (4 - Td or Tdap) 10/18/2029   HPV VACCINES  Aged Out  Hepatitis C Screening  Discontinued   HIV Screening  Discontinued    Allergies  Allergen Reactions   Aspirin Other (See Comments)    CONTRAINDICATED DUE TO HISTORY OF BLEEDING IN BRAIN   Cheese     Cant take due to history of severe migraines   Chocolate     Cant take due to history of severe migraines   Coffee Flavor     Cant take due to history of severe migraines   Other      Nuts - Cant take due to history of severe migraines   Red Wine Complex [Germanium]     Cant take due to history of severe migraines    Outpatient Encounter Medications as of 04/27/2022  Medication Sig   acetaminophen (TYLENOL) 325 MG tablet Take 650 mg by mouth every 4 (four) hours as needed for headache or mild pain.   baclofen (LIORESAL) 10 MG tablet Take 10 mg by mouth at bedtime.   Calcium Carbonate 500 MG CHEW Chew 500 mg by mouth daily.   Calcium Citrate-Vitamin D 315-5 MG-MCG TABS Take 1 tablet by mouth daily.   Docosahexaenoic Acid (DHA NATURAL OMEGA-3 PO) Take 1 capsule by mouth daily. Omega-3 DHA (DR/EC) '350mg'$ -'325mg'$ -'90mg'$ -'597mg'$    enoxaparin (LOVENOX) 30 MG/0.3ML injection Inject 0.3 mLs (30 mg total) into the skin daily.   fexofenadine (ALLEGRA) 180 MG tablet Take 180 mg by mouth daily.   guaiFENesin (MUCINEX) 600 MG 12 hr tablet Take 600 mg by mouth 2  (two) times daily as needed for cough.   Multiple Vitamins-Minerals (THEREMS-M) TABS Take 1 tablet by mouth daily.   nystatin (MYCOSTATIN/NYSTOP) powder Apply 1 application. topically 2 (two) times daily as needed (rash under breast).   Omega-3 Fatty Acids (OMEGA-3 FISH OIL PO) Take 1 capsule by mouth daily.   polyethylene glycol (MIRALAX / GLYCOLAX) 17 g packet Take 17 g by mouth daily as needed for mild constipation.   predniSONE (DELTASONE) 20 MG tablet Take 3 tabs on day 1, 2 and 1/2 tabs on day 2, 2 tabs on day 3, 1 and 1/2 tabs on day 4, 1 tab on day 5, 1/2 tab on days 6 and 7, then stop.   Propylene Glycol (SYSTANE BALANCE) 0.6 % SOLN Place 1 drop into both eyes 2 (two) times daily.   rosuvastatin (CRESTOR) 10 MG tablet Take 1 tablet (10 mg total) by mouth daily.   senna (SENOKOT) 8.6 MG TABS tablet Take 1 tablet (8.6 mg total) by mouth 2 (two) times daily.   tiZANidine (ZANAFLEX) 2 MG tablet Take 1 tablet as needed once a day for headache (Give Tizanidine instead of Tylenol)   topiramate (TOPAMAX) 100 MG tablet Take 300 mg by mouth 2 (two) times daily.   Vitamin D3 (VITAMIN D) 25 MCG tablet Take 1,000 Units by mouth daily.   zinc oxide 20 % ointment Apply 1 application. topically as needed (after every incontinent episode).   No facility-administered encounter medications on file as of 04/27/2022.    Review of Systems  Constitutional: Negative.   HENT: Negative.    Respiratory: Negative.    Cardiovascular: Negative.   Genitourinary:  Positive for frequency.  Musculoskeletal:  Positive for gait problem.  Neurological:  Positive for seizures.  Hematological: Negative.     There were no vitals filed for this visit. There is no height or weight on file to calculate BMI. Physical Exam Vitals and nursing note reviewed.  Constitutional:      Appearance: Normal appearance.  Cardiovascular:  Rate and Rhythm: Normal rate and regular rhythm.  Pulmonary:     Effort: Pulmonary  effort is normal.     Breath sounds: Normal breath sounds.  Neurological:     Mental Status: She is alert.   Increased tone on right side with spastic contracture of right upper extremity has right spastic hemiparetic gait and holds walker with her left hand  Labs reviewed: Basic Metabolic Panel: Recent Labs    07/08/21 1052 07/09/21 0456 07/12/21 2320 07/13/21 0304 07/14/21 0301 07/15/21 0145 07/16/21 0414 07/16/21 1403 07/17/21 0524  NA 139   < >  --    < > 134* 133* 126* 135 135  K 3.9   < >  --    < > 3.3* 4.0 3.1*  --  3.7  CL 106   < >  --    < > 113* 112* 103  --  113*  CO2 27   < >  --    < > 16* 15* 15*  --  17*  GLUCOSE 99   < >  --    < > 119* 90 127*  --  85  BUN 26*   < >  --    < > '22 23 19  '$ --  18  CREATININE 1.32*   < >  --    < > 0.89 0.84 0.83  --  0.76  CALCIUM 9.4   < >  --    < > 8.3* 8.0* 8.0*  --  8.1*  MG 2.4   < > 1.9  --  1.7 2.2  --   --  1.7  PHOS 4.2  --  1.9*  --   --   --   --   --   --    < > = values in this interval not displayed.   Liver Function Tests: Recent Labs    07/08/21 1052 07/09/21 0456 07/12/21 1554  AST 57* 41 31  ALT 34 28 10  ALKPHOS 80 63 69  BILITOT 0.7 0.6 0.8  PROT 6.5 5.6* 4.9*  ALBUMIN 3.3* 2.8* 2.1*   No results for input(s): "LIPASE", "AMYLASE" in the last 8760 hours. Recent Labs    07/10/21 1828 07/14/21 0301  AMMONIA 76* 46*   CBC: Recent Labs    07/08/21 1052 07/09/21 0456 07/12/21 1554 07/13/21 0304 07/14/21 0301 07/15/21 0145 07/16/21 0414  WBC 6.5   < > 11.3* 13.8* 11.8* 10.2 10.3  NEUTROABS 5.0  --  9.1* 10.9*  --   --   --   HGB 15.3*   < > 9.9* 12.7 11.3* 10.4* 10.8*  HCT 45.3   < > 29.6* 37.2 31.4* 29.9* 31.7*  MCV 106.3*   < > 106.9* 105.7* 100.3* 103.5* 104.3*  PLT 93*   < > 95* 128* 124* 129* 159   < > = values in this interval not displayed.   Cardiac Enzymes: Recent Labs    07/12/21 2320  CKTOTAL 41   BNP: Invalid input(s): "POCBNP" Lab Results  Component Value Date    HGBA1C 4.8 12/19/2020   Lab Results  Component Value Date   TSH 3.039 07/15/2021   Lab Results  Component Value Date   TUUEKCMK34 917 07/09/2021   Lab Results  Component Value Date   FOLATE 45.2 07/09/2021   Lab Results  Component Value Date   IRON 78 03/01/2019   TIBC 267 03/01/2019   FERRITIN 192 03/01/2019    Imaging and Procedures obtained  prior to SNF admission: MM 3D SCREEN BREAST BILATERAL  Result Date: 11/25/2021 CLINICAL DATA:  Screening. EXAM: DIGITAL SCREENING BILATERAL MAMMOGRAM WITH TOMOSYNTHESIS AND CAD TECHNIQUE: Bilateral screening digital craniocaudal and mediolateral oblique mammograms were obtained. Bilateral screening digital breast tomosynthesis was performed. The images were evaluated with computer-aided detection. COMPARISON:  Previous exam(s). ACR Breast Density Category b: There are scattered areas of fibroglandular density. FINDINGS: There are no findings suspicious for malignancy. IMPRESSION: No mammographic evidence of malignancy. A result letter of this screening mammogram will be mailed directly to the patient. RECOMMENDATION: Screening mammogram in one year. (Code:SM-B-01Y) BI-RADS CATEGORY  1: Negative. Electronically Signed   By: Lillia Mountain M.D.   On: 11/25/2021 16:13    Assessment/Plan 1. Chronic kidney disease, stage 3b ([place]) Creatinines have declined since she was hospitalized last March and are now normal  2. Gait abnormality Uses walker with her left hand.  Active in the facility.  No recent falls  3. Headache, unspecified headache type Migraine headache and seizures controlled with topiramate  4. Hyperlipidemia, mixed Takes rosuvastatin; last LDL was 10 months ago; 80  5. Impaired cognition Memory seems pretty good but she repeated herself during the brief time of our encounter, telling me about her father's death and her subsequent fall out of bed and fall with resulting in fractured femur    Family/ staff Communication:    Labs/tests ordered: none  Lillette Boxer. Sabra Heck, [place] [address] [place], [place] Office [phone]

## 2022-05-11 ENCOUNTER — Encounter: Payer: Self-pay | Admitting: Nurse Practitioner

## 2022-05-11 ENCOUNTER — Non-Acute Institutional Stay: Payer: Medicare Other | Admitting: Nurse Practitioner

## 2022-05-11 DIAGNOSIS — I2584 Coronary atherosclerosis due to calcified coronary lesion: Secondary | ICD-10-CM

## 2022-05-11 DIAGNOSIS — G40909 Epilepsy, unspecified, not intractable, without status epilepticus: Secondary | ICD-10-CM

## 2022-05-11 DIAGNOSIS — K5901 Slow transit constipation: Secondary | ICD-10-CM

## 2022-05-11 DIAGNOSIS — H1032 Unspecified acute conjunctivitis, left eye: Secondary | ICD-10-CM

## 2022-05-11 DIAGNOSIS — E782 Mixed hyperlipidemia: Secondary | ICD-10-CM | POA: Diagnosis not present

## 2022-05-11 DIAGNOSIS — R4189 Other symptoms and signs involving cognitive functions and awareness: Secondary | ICD-10-CM | POA: Diagnosis not present

## 2022-05-11 DIAGNOSIS — R918 Other nonspecific abnormal finding of lung field: Secondary | ICD-10-CM

## 2022-05-11 DIAGNOSIS — I2510 Atherosclerotic heart disease of native coronary artery without angina pectoris: Secondary | ICD-10-CM

## 2022-05-11 DIAGNOSIS — M1990 Unspecified osteoarthritis, unspecified site: Secondary | ICD-10-CM | POA: Insufficient documentation

## 2022-05-11 DIAGNOSIS — N1832 Chronic kidney disease, stage 3b: Secondary | ICD-10-CM | POA: Diagnosis not present

## 2022-05-11 DIAGNOSIS — G8191 Hemiplegia, unspecified affecting right dominant side: Secondary | ICD-10-CM | POA: Diagnosis not present

## 2022-05-11 DIAGNOSIS — H109 Unspecified conjunctivitis: Secondary | ICD-10-CM | POA: Insufficient documentation

## 2022-05-11 DIAGNOSIS — R269 Unspecified abnormalities of gait and mobility: Secondary | ICD-10-CM | POA: Diagnosis not present

## 2022-05-11 DIAGNOSIS — R519 Headache, unspecified: Secondary | ICD-10-CM | POA: Diagnosis not present

## 2022-05-11 DIAGNOSIS — D500 Iron deficiency anemia secondary to blood loss (chronic): Secondary | ICD-10-CM | POA: Diagnosis not present

## 2022-05-11 DIAGNOSIS — R0989 Other specified symptoms and signs involving the circulatory and respiratory systems: Secondary | ICD-10-CM

## 2022-05-11 DIAGNOSIS — M159 Polyosteoarthritis, unspecified: Secondary | ICD-10-CM

## 2022-05-11 DIAGNOSIS — M150 Primary generalized (osteo)arthritis: Secondary | ICD-10-CM

## 2022-05-11 NOTE — Assessment & Plan Note (Signed)
Impaired cognition, difficulty word findings/forming sentences. 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  TSH 3.039 07/15/21

## 2022-05-11 NOTE — Assessment & Plan Note (Signed)
left eye medial aspect redness, irritable, denied pain, no discharge, or change of vision. Naphcon A I gtt bid left eye for now.

## 2022-05-11 NOTE — Progress Notes (Signed)
Location:  [facility] Room Number: AL/805/A Place of Service:  ALF (13) Provider:  Sherilynn Dieu X, NP  Patient Care Team: Fotios Amos X, NP as PCP - General (Internal Medicine) Werner Lean, MD as PCP - Cardiology (Cardiology) Cameron Sprang, MD as Consulting Physician (Neurology) Corliss Parish, MD as Consulting Physician (Nephrology) Raynelle Bring, MD as Consulting Physician (Urology)  Extended Emergency Contact Information Primary Emergency Contact: Barnie Del States of [place] Phone: [phone] Mobile Phone: [phone] Relation: Relative Secondary Emergency Contact: Roper,Paula Address: [place] [address] Johnnette Litter of [place] Phone: [phone] Mobile Phone: [phone] Relation: Sister  Code Status:  DNR Goals of care: Advanced Directive information    02/26/2022    1:31 PM  Advanced Directives  Does Patient Have a Medical Advance Directive? Yes  Type of Paramedic of [place];Living will     Chief Complaint  Patient presents with   Acute Visit    Patient is being seen for redness in left eye    HPI:  Pt is a 67 y.o. female seen today for an acute visit for left eye medial aspect redness, irritable, denied pain, no discharge, or change of vision.    Constipation, stable, takes Senokot, MiraLax             R hip pain, on Tylenol, Oxycodone for pain, R hip ORIF 07/09/21             Dysphagia 2, thin liquid.              GERD, stable             Hypokalemia, K 3.6 08/07/21              CAD, no angina. F/u Cariology.              HTN controlled w/o meds.              HLD 08/10/21 Cardiology Rosuvastatin             Impaired cognition, difficulty word findings/forming sentences. 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  TSH 3.039 07/15/21 Migraines, no flare ups since 2017, Tylenol, Zanaflex, treated with steroids  in the past.              Pulmonary nodules, f/u Pulmonology, R upper lobe nodule, had bronchoscopy 04/21/21-negative for malignancy or infection.              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 takes Baclofen nightly.              Hx of cerebral AVMs             CKD III, Bun/creat 14/1.2 08/07/21             Gait abnormality, patient ambulated with walker, R ankle brace.              Anemia, Hgb 10.7, Iron 93 08/07/21  Past Medical History:  Diagnosis Date   Acute kidney injury ([place]) 09/03/2019   05-2019 kidney stone  removed   Allergy    seasonal allergies   Arthritis    LEFT hand   AVM (arteriovenous malformation) brain    Cataract    bilateral-not a surgical candidate at this time (07/23/2020)   COVID-19 virus infection 08/2020   GERD (gastroesophageal reflux disease)    on meds   Hyperlipidemia    diet controlled   Insulin resistance 02/16/2018   Seizures ([employer])    Grand Mal & Partial complex seizure disorder-last 1998   Stroke [facility])    5 total so far (age [phone])   Ureteral stone 05/10/2019   has multiple kidney stones noted from Urologist   Uses roller walker    Past Surgical History:  Procedure Laterality Date   BRAIN SURGERY  09/19/1978   at [facility], Dr. Leida Lauth   BRONCHIAL BIOPSY  04/21/2021   Procedure: BRONCHIAL BIOPSIES;  Surgeon: Garner Nash, DO;  Location: [facility];  Service: Pulmonary;;   BRONCHIAL BRUSHINGS  04/21/2021   Procedure: BRONCHIAL BRUSHINGS;  Surgeon: Garner Nash, DO;  Location: [facility];  Service: Pulmonary;;   BRONCHIAL WASHINGS  04/21/2021   Procedure: BRONCHIAL WASHINGS;  Surgeon: Garner Nash, DO;  Location: [facility];   Service: Pulmonary;;   COLONOSCOPY  2017   [place], [place]   CYSTOSCOPY/URETEROSCOPY/HOLMIUM LASER/STENT PLACEMENT Left 05/10/2019   Procedure: CYSTOSCOPY/RETROGRADE/URETEROSCOPY/HOLMIUM LASER/STENT PLACEMENT;  Surgeon: Raynelle Bring, MD;  Location: [facility];  Service: Urology;  Laterality: Left;   FIDUCIAL MARKER PLACEMENT  04/21/2021   Procedure: FIDUCIAL MARKER PLACEMENT;  Surgeon: Garner Nash, DO;  Location: [facility];  Service: Pulmonary;;   FOOT SURGERY Right 10/1985   ankle fusion, at [facility], Dr. Lorelee Cover   INTRAMEDULLARY (IM) NAIL INTERTROCHANTERIC Right 07/09/2021   Procedure: INTRAMEDULLARY (IM) NAIL INTERTROCHANTRIC;  Surgeon: Rod Can, MD;  Location: [facility];  Service: Orthopedics;  Laterality: Right;   POLYPECTOMY  2017   multiple adenomas   TUBAL LIGATION  1981   VIDEO BRONCHOSCOPY WITH RADIAL ENDOBRONCHIAL ULTRASOUND  04/21/2021   Procedure: RADIAL ENDOBRONCHIAL ULTRASOUND;  Surgeon: Garner Nash, DO;  Location: [facility];  Service: Pulmonary;;   WISDOM TOOTH EXTRACTION      Allergies  Allergen Reactions   Aspirin Other (See Comments)    CONTRAINDICATED DUE TO HISTORY OF BLEEDING IN BRAIN   Cheese     Cant take due to history of severe migraines   Chocolate     Cant take due to history of severe migraines   Coffee Flavor     Cant take due to history of severe migraines   Other      Nuts - Cant take due to history of severe migraines   Red Wine Complex [Germanium]     Cant take due to history of severe migraines    Outpatient Encounter Medications as of 05/11/2022  Medication Sig   acetaminophen (TYLENOL) 325 MG tablet Take 650 mg by mouth every 4 (four) hours as needed for headache or mild pain.   baclofen (LIORESAL) 10 MG tablet Take 10 mg by mouth at bedtime.   Calcium Carbonate 500 MG CHEW Chew 500 mg by mouth daily.   Calcium Citrate-Vitamin D 315-5 MG-MCG TABS Take 1 tablet by mouth daily.   Docosahexaenoic Acid (DHA NATURAL OMEGA-3 PO) Take 1 capsule  by mouth daily. Omega-3 DHA (DR/EC) '350mg'$ -'325mg'$ -'90mg'$ -'597mg'$    fexofenadine (ALLEGRA) 180 MG tablet Take 180 mg by mouth daily.   guaiFENesin (MUCINEX) 600 MG 12 hr tablet Take 600 mg by mouth 2 (two) times  daily as needed for cough.   Multiple Vitamins-Minerals (THEREMS-M) TABS Take 1 tablet by mouth daily.   nystatin (MYCOSTATIN/NYSTOP) powder Apply 1 application. topically 2 (two) times daily as needed (rash under breast).   Omega-3 Fatty Acids (OMEGA-3 FISH OIL PO) Take 1 capsule by mouth daily.   polyethylene glycol (MIRALAX / GLYCOLAX) 17 g packet Take 17 g by mouth daily as needed for mild constipation.   predniSONE (DELTASONE) 20 MG tablet Take 3 tabs on day 1, 2 and 1/2 tabs on day 2, 2 tabs on day 3, 1 and 1/2 tabs on day 4, 1 tab on day 5, 1/2 tab on days 6 and 7, then stop.   Propylene Glycol (SYSTANE BALANCE) 0.6 % SOLN Place 1 drop into both eyes 2 (two) times daily.   rosuvastatin (CRESTOR) 10 MG tablet Take 1 tablet (10 mg total) by mouth daily.   senna (SENOKOT) 8.6 MG TABS tablet Take 1 tablet (8.6 mg total) by mouth 2 (two) times daily.   tiZANidine (ZANAFLEX) 2 MG tablet Take 1 tablet as needed once a day for headache (Give Tizanidine instead of Tylenol)   topiramate (TOPAMAX) 100 MG tablet Take 300 mg by mouth 2 (two) times daily.   Vitamin D3 (VITAMIN D) 25 MCG tablet Take 1,000 Units by mouth daily.   zinc oxide 20 % ointment Apply 1 application. topically as needed (after every incontinent episode).   enoxaparin (LOVENOX) 30 MG/0.3ML injection Inject 0.3 mLs (30 mg total) into the skin daily.   No facility-administered encounter medications on file as of 05/11/2022.    Review of Systems  Constitutional:  Negative for activity change, fatigue and fever.  HENT:  Positive for trouble swallowing. Negative for congestion.   Eyes:  Positive for redness and itching. Negative for pain, discharge and visual disturbance.       Lateral right peripheral vision lost  Respiratory:   Negative for cough, shortness of breath and wheezing.   Cardiovascular:  Negative for leg swelling.  Gastrointestinal:  Negative for abdominal pain and constipation.  Genitourinary:  Negative for dysuria, frequency and urgency.  Musculoskeletal:  Positive for arthralgias and gait problem.       R hip pain, better. Right ankle brace. R wrist contracture.   Skin:  Negative for color change.  Neurological:  Positive for speech difficulty and weakness. Negative for seizures and headaches.       Difficulty words finding, memory lapses.   Psychiatric/Behavioral:  Negative for behavioral problems, hallucinations and sleep disturbance. The patient is not nervous/anxious.     Immunization History  Administered Date(s) Administered   Influenza Inj Mdck Quad Pf 02/16/2017   Influenza Inj Mdck Quad With Preservative 02/16/2018, 03/01/2019   Influenza,inj,Quad PF,6+ Mos 03/01/2017   Influenza,inj,quad, With Preservative 02/18/2015   Influenza-Unspecified 03/01/2017, 12/17/2019, 02/24/2021, 02/22/2022   Moderna Covid-19 Vaccine Bivalent Booster 53yr & up 09/18/2021   PFIZER(Purple Top)SARS-COV-2 Vaccination 07/22/2019, 08/12/2019, 03/10/2020, 11/10/2020, 01/20/2021   PPD Test 07/17/2021, 07/30/2021   Pfizer Covid-19 Vaccine Bivalent Booster 164yr& up 03/04/2022   Pneumococcal Polysaccharide-23 05/03/2004   Pneumococcal-Unspecified 05/03/2004   Respiratory Syncytial Virus Vaccine,Recomb Aduvanted(Arexvy) 04/30/2022   Td 10/19/2019   Td (Adult),unspecified 10/19/2019   Tdap 08/11/2009   Zoster, Live 04/14/2012   Pertinent  Health Maintenance Due  Topic Date Due   PAP SMEAR-Modifier  12/06/2020   MAMMOGRAM  11/25/2023   COLONOSCOPY (Pts [id number] coverage will need to be confirmed)  01/09/2024   INFLUENZA VACCINE  Completed  07/16/2021   11:00 AM 07/16/2021   12:00 PM 07/16/2021    8:00 PM 07/29/2021    1:08 PM 02/26/2022    1:30 PM  [place] in the past year?    1 0   Was there an injury with Fall?    1 0  Fall Risk Category Calculator    2 0  Fall Risk Category    Moderate Low  Patient Fall Risk Level High fall risk High fall risk High fall risk Moderate fall risk High fall risk  Fall risk Follow up     Falls evaluation completed   Functional Status Survey:    Vitals:   05/11/22 1054  BP: 120/78  Pulse: 68  Resp: 18  Temp: (!) 97.3 F (36.3 C)  SpO2: 97%  Weight: 161 lb 12.8 oz (73.4 kg)  Height: '5\' 4"'$  (1.626 m)   Body mass index is 27.77 kg/m. Physical Exam Vitals and nursing note reviewed.  Constitutional:      Appearance: Normal appearance.  HENT:     Head: Normocephalic and atraumatic.     Nose: Nose normal.     Mouth/Throat:     Mouth: Mucous membranes are moist.  Eyes:     Extraocular Movements: Extraocular movements intact.     Pupils: Pupils are equal, round, and reactive to light.     Comments: Left conjunctival redness, irritation, no drainage, no stye  Cardiovascular:     Rate and Rhythm: Normal rate and regular rhythm.     Heart sounds: No murmur heard. Pulmonary:     Effort: Pulmonary effort is normal.     Breath sounds: No rales.  Abdominal:     General: Bowel sounds are normal.     Palpations: Abdomen is soft.     Tenderness: There is abdominal tenderness.  Musculoskeletal:        General: Deformity present.     Cervical back: Normal range of motion and neck supple.     Right lower leg: No edema.     Left lower leg: No edema.     Comments: Right wrist contracture, right ankle brace  Skin:    General: Skin is warm and dry.  Neurological:     General: No focal deficit present.     Mental Status: She is alert and oriented to person, place, and time. Mental status is at baseline.     Cranial Nerves: No cranial nerve deficit.     Motor: Weakness present.     Coordination: Coordination abnormal.     Gait: Gait abnormal.     Comments: Right wrist contracture, muscle strength 1/5 RUE, 4-5 RLE. Uses R ankle  brace.  Psychiatric:        Mood and Affect: Mood normal.        Behavior: Behavior normal.        Thought Content: Thought content normal.     Labs reviewed: Recent Labs    07/08/21 1052 07/09/21 0456 07/12/21 2320 07/13/21 0304 07/14/21 0301 07/15/21 0145 07/16/21 0414 07/16/21 1403 07/17/21 0524 08/07/21 0000  NA 139   < >  --    < > 134* 133* 126* 135 135 142  K 3.9   < >  --    < > 3.3* 4.0 3.1*  --  3.7 3.6  CL 106   < >  --    < > 113* 112* 103  --  113* 109*  CO2 27   < >  --    < >  16* 15* 15*  --  17* 26*  GLUCOSE 99   < >  --    < > 119* 90 127*  --  85  --   BUN 26*   < >  --    < > '22 23 19  '$ --  18 14  CREATININE 1.32*   < >  --    < > 0.89 0.84 0.83  --  0.76 1.2*  CALCIUM 9.4   < >  --    < > 8.3* 8.0* 8.0*  --  8.1* 9.6  MG 2.4   < > 1.9  --  1.7 2.2  --   --  1.7  --   PHOS 4.2  --  1.9*  --   --   --   --   --   --   --    < > = values in this interval not displayed.   Recent Labs    07/08/21 1052 07/09/21 0456 07/12/21 1554  AST 57* 41 31  ALT 34 28 10  ALKPHOS 80 63 69  BILITOT 0.7 0.6 0.8  PROT 6.5 5.6* 4.9*  ALBUMIN 3.3* 2.8* 2.1*   Recent Labs    07/12/21 1554 07/13/21 0304 07/14/21 0301 07/15/21 0145 07/16/21 0414 08/07/21 0000  WBC 11.3* 13.8* 11.8* 10.2 10.3 4.3  NEUTROABS 9.1* 10.9*  --   --   --  1,488.00  HGB 9.9* 12.7 11.3* 10.4* 10.8* 10.7*  HCT 29.6* 37.2 31.4* 29.9* 31.7* 33*  MCV 106.9* 105.7* 100.3* 103.5* 104.3*  --   PLT 95* 128* 124* 129* 159 267   Lab Results  Component Value Date   TSH 3.039 07/15/2021   Lab Results  Component Value Date   HGBA1C 4.8 12/19/2020   Lab Results  Component Value Date   CHOL 158 07/09/2021   HDL 58 07/09/2021   LDLCALC 85 07/09/2021   TRIG 74 07/09/2021   CHOLHDL 2.7 07/09/2021    Significant Diagnostic Results in last 30 days:  No results found.  Assessment/Plan Conjunctivitis left eye medial aspect redness, irritable, denied pain, no discharge, or change of vision.  Naphcon A I gtt bid left eye for now.   Right hemiparesis (HCC)  R hemiparesis, lateral right peripheral vision deficit, right wrist contracture, as an result of a hemorrhagic stroke at age of 62 and subsequent strokes x5 per patient. takes Baclofen nightly. Therapy to evaluate and treat for R wrist contracture.     Hx of cerebral AVMs  Chronic kidney disease, stage 3b ([place]) Bun/creat 14/1.2 08/07/21  Gait abnormality patient ambulated with walker, R ankle brace.   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   Headache, unspecified headache type Migraines, no flare ups since 2017, Tylenol, Zanaflex, treated with steroids in the past.   Impaired cognition  Impaired cognition, difficulty word findings/forming sentences. 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  TSH 3.039 07/15/21  Hyperlipidemia, mixed HLD 08/10/21 Cardiology Rosuvastatin  Labile hypertension controlled w/o meds.   Coronary artery calcification no angina. F/u Cariology.   Slow transit constipation stable, takes Senokot, MiraLax  Osteoarthritis R hip pain, on Tylenol, Oxycodone for pain, R hip ORIF 07/09/21  Family/ staff Communication: plan of care reviewed with the patient and charge nurse.   Labs/tests ordered:  none  Time spend 40 minutes.

## 2022-05-11 NOTE — Assessment & Plan Note (Signed)
no angina. F/u Cariology.

## 2022-05-11 NOTE — Assessment & Plan Note (Signed)
stable, takes Senokot, MiraLax

## 2022-05-11 NOTE — Assessment & Plan Note (Signed)
patient ambulated with walker, R ankle brace.

## 2022-05-11 NOTE — Assessment & Plan Note (Signed)
R hemiparesis, lateral right peripheral vision deficit, right wrist contracture, as an result of a hemorrhagic stroke at age of 53 and subsequent strokes x5 per patient. takes Baclofen nightly. Therapy to evaluate and treat for R wrist contracture.     Hx of cerebral AVMs

## 2022-05-11 NOTE — Assessment & Plan Note (Signed)
controlled w/o meds.

## 2022-05-11 NOTE — Assessment & Plan Note (Signed)
Pulmonary nodules, f/u Pulmonology, R upper lobe nodule, had bronchoscopy 04/21/21-negative for malignancy or infection.

## 2022-05-11 NOTE — Assessment & Plan Note (Signed)
HLD 08/10/21 Cardiology Rosuvastatin

## 2022-05-11 NOTE — Assessment & Plan Note (Signed)
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

## 2022-05-11 NOTE — Assessment & Plan Note (Signed)
Migraines, no flare ups since 2017, Tylenol, Zanaflex, treated with steroids in the past.

## 2022-05-11 NOTE — Assessment & Plan Note (Signed)
Bun/creat 14/1.2 08/07/21

## 2022-05-11 NOTE — Assessment & Plan Note (Signed)
Hgb 10.7, Iron 93 08/07/21

## 2022-05-11 NOTE — Assessment & Plan Note (Signed)
R hip pain, on Tylenol, Oxycodone for pain, R hip ORIF 07/09/21

## 2022-05-17 ENCOUNTER — Encounter: Payer: Self-pay | Admitting: Neurology

## 2022-05-19 DIAGNOSIS — M24541 Contracture, right hand: Secondary | ICD-10-CM | POA: Diagnosis not present

## 2022-05-19 DIAGNOSIS — M25631 Stiffness of right wrist, not elsewhere classified: Secondary | ICD-10-CM | POA: Diagnosis not present

## 2022-05-24 DIAGNOSIS — M25631 Stiffness of right wrist, not elsewhere classified: Secondary | ICD-10-CM | POA: Diagnosis not present

## 2022-05-24 DIAGNOSIS — M24541 Contracture, right hand: Secondary | ICD-10-CM | POA: Diagnosis not present

## 2022-05-26 ENCOUNTER — Telehealth: Payer: Self-pay | Admitting: Anesthesiology

## 2022-05-26 DIAGNOSIS — M24541 Contracture, right hand: Secondary | ICD-10-CM | POA: Diagnosis not present

## 2022-05-26 DIAGNOSIS — M25631 Stiffness of right wrist, not elsewhere classified: Secondary | ICD-10-CM | POA: Diagnosis not present

## 2022-05-26 NOTE — Telephone Encounter (Signed)
Pt called stating she would like to ask Dr Delice Lesch if she can follow up once a year instead of every 6 months. Pt states she has been doing well and feels there is no need to be seen twice a year.

## 2022-05-26 NOTE — Telephone Encounter (Signed)
That is fine, ok for appt in October 2024, she knows to call if any changes in symptoms. Thanks

## 2022-05-28 DIAGNOSIS — M25631 Stiffness of right wrist, not elsewhere classified: Secondary | ICD-10-CM | POA: Diagnosis not present

## 2022-05-28 DIAGNOSIS — M24541 Contracture, right hand: Secondary | ICD-10-CM | POA: Diagnosis not present

## 2022-05-31 DIAGNOSIS — M24541 Contracture, right hand: Secondary | ICD-10-CM | POA: Diagnosis not present

## 2022-05-31 DIAGNOSIS — M25631 Stiffness of right wrist, not elsewhere classified: Secondary | ICD-10-CM | POA: Diagnosis not present

## 2022-06-02 DIAGNOSIS — M24541 Contracture, right hand: Secondary | ICD-10-CM | POA: Diagnosis not present

## 2022-06-02 DIAGNOSIS — M25631 Stiffness of right wrist, not elsewhere classified: Secondary | ICD-10-CM | POA: Diagnosis not present

## 2022-06-03 DIAGNOSIS — M25631 Stiffness of right wrist, not elsewhere classified: Secondary | ICD-10-CM | POA: Diagnosis not present

## 2022-06-03 DIAGNOSIS — M24541 Contracture, right hand: Secondary | ICD-10-CM | POA: Diagnosis not present

## 2022-06-08 DIAGNOSIS — M24541 Contracture, right hand: Secondary | ICD-10-CM | POA: Diagnosis not present

## 2022-06-08 DIAGNOSIS — M25631 Stiffness of right wrist, not elsewhere classified: Secondary | ICD-10-CM | POA: Diagnosis not present

## 2022-06-10 DIAGNOSIS — M25631 Stiffness of right wrist, not elsewhere classified: Secondary | ICD-10-CM | POA: Diagnosis not present

## 2022-06-10 DIAGNOSIS — M24541 Contracture, right hand: Secondary | ICD-10-CM | POA: Diagnosis not present

## 2022-06-11 DIAGNOSIS — M24541 Contracture, right hand: Secondary | ICD-10-CM | POA: Diagnosis not present

## 2022-06-11 DIAGNOSIS — M25631 Stiffness of right wrist, not elsewhere classified: Secondary | ICD-10-CM | POA: Diagnosis not present

## 2022-06-15 DIAGNOSIS — M24541 Contracture, right hand: Secondary | ICD-10-CM | POA: Diagnosis not present

## 2022-06-15 DIAGNOSIS — M25631 Stiffness of right wrist, not elsewhere classified: Secondary | ICD-10-CM | POA: Diagnosis not present

## 2022-06-16 DIAGNOSIS — M24541 Contracture, right hand: Secondary | ICD-10-CM | POA: Diagnosis not present

## 2022-06-16 DIAGNOSIS — M25631 Stiffness of right wrist, not elsewhere classified: Secondary | ICD-10-CM | POA: Diagnosis not present

## 2022-06-18 DIAGNOSIS — M25631 Stiffness of right wrist, not elsewhere classified: Secondary | ICD-10-CM | POA: Diagnosis not present

## 2022-06-18 DIAGNOSIS — M24541 Contracture, right hand: Secondary | ICD-10-CM | POA: Diagnosis not present

## 2022-06-22 DIAGNOSIS — M25631 Stiffness of right wrist, not elsewhere classified: Secondary | ICD-10-CM | POA: Diagnosis not present

## 2022-06-22 DIAGNOSIS — M24541 Contracture, right hand: Secondary | ICD-10-CM | POA: Diagnosis not present

## 2022-06-23 DIAGNOSIS — M25631 Stiffness of right wrist, not elsewhere classified: Secondary | ICD-10-CM | POA: Diagnosis not present

## 2022-06-23 DIAGNOSIS — M24541 Contracture, right hand: Secondary | ICD-10-CM | POA: Diagnosis not present

## 2022-06-24 DIAGNOSIS — M24541 Contracture, right hand: Secondary | ICD-10-CM | POA: Diagnosis not present

## 2022-06-24 DIAGNOSIS — M25631 Stiffness of right wrist, not elsewhere classified: Secondary | ICD-10-CM | POA: Diagnosis not present

## 2022-06-29 DIAGNOSIS — L821 Other seborrheic keratosis: Secondary | ICD-10-CM | POA: Diagnosis not present

## 2022-06-29 DIAGNOSIS — D229 Melanocytic nevi, unspecified: Secondary | ICD-10-CM | POA: Diagnosis not present

## 2022-06-29 DIAGNOSIS — M24541 Contracture, right hand: Secondary | ICD-10-CM | POA: Diagnosis not present

## 2022-06-29 DIAGNOSIS — B351 Tinea unguium: Secondary | ICD-10-CM | POA: Diagnosis not present

## 2022-06-29 DIAGNOSIS — L814 Other melanin hyperpigmentation: Secondary | ICD-10-CM | POA: Diagnosis not present

## 2022-06-29 DIAGNOSIS — M25631 Stiffness of right wrist, not elsewhere classified: Secondary | ICD-10-CM | POA: Diagnosis not present

## 2022-06-30 NOTE — Progress Notes (Unsigned)
Cardiology Office Note:    Date:  07/01/2022   ID:  Cheena Hofmann Bugarin, DOB 03-Mar-1956, MRN [id number]  PCP:  Mast, Man X, NP   [facility] Providers Cardiologist:  Werner Lean, MD     Referring MD: Mast, Man X, NP   CC: f/u coronary calcification, HTN   History of Present Illness:    Tonya Thompson is a 67 y.o. 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  Initially established care with our practice on 06/17/2021 with Dr. Gasper Sells in February 2023 for aortic atherosclerosis and coronary calcification noted on CTA in 2022.  She did not have any anginal complaints at this time.  She was not felt to be a candidate for future antiplatelet or anticoagulation therapy due to her history of AV malformations and subsequent intracranial hemorrhage.  She was admitted on 07/12/2021 to 07/17/2021 for acute metabolic encephalopathy after recent hospitalization for hip fracture, CT head and MRI brain were without acute intracranial findings, Keppra and Depakote were DC'd due to elevated ammonia level by neurology.  Echo revealed an EF of 55 to 60%, grade 1 DD. She has not had clinical CHF.  She presented for a follow-up evaluation on 07/23/2021 following her recent hospitalization, she was complaining of lower sternal discomfort for 4 to 6 months that was exacerbated by positional changes, pain was reproducible by touch as well.  She is not a candidate for aspirin due to history of AVMs and recurrent cerebral hemorrhage, statin was continued.  She presents today for follow-up of coronary artery calcification, and labile hypertension.  She has been doing well from a cardiac  perspective, she has recently had to move into a smaller apartment at her facility and this is causing her some stress.  Her blood pressure was initially elevated at 140/84, recheck was 128/74.  She has been intentionally trying to cut back on her carbohydrate intake and has lost a few pounds. She denies chest pain, palpitations, dyspnea, pnd, orthopnea, n, v, dizziness, syncope, edema, weight gain, or early satiety.   Past Medical History:  Diagnosis Date   Acute kidney injury ([place]) 09/03/2019   05-2019 kidney stone removed   Allergy    seasonal allergies   Arthritis    LEFT hand   AVM (arteriovenous malformation) brain    Cataract    bilateral-not a surgical candidate at this time (07/23/2020)   COVID-19 virus infection 08/2020   GERD (gastroesophageal reflux disease)    on meds   Hyperlipidemia    diet controlled   Insulin resistance 02/16/2018   Seizures ([place])    Grand Mal & Partial complex seizure disorder-last 1998   Stroke [facility])    5 total so far (age [phone])   Ureteral stone 05/10/2019   has multiple kidney stones noted from Urologist   Uses roller walker     Past Surgical History:  Procedure Laterality Date   BRAIN SURGERY  09/19/1978   at [facility], Dr. Leida Lauth   BRONCHIAL BIOPSY  04/21/2021   Procedure: BRONCHIAL BIOPSIES;  Surgeon: Garner Nash, DO;  Location: [place] [facility];  Service: Pulmonary;;   BRONCHIAL BRUSHINGS  04/21/2021   Procedure: BRONCHIAL BRUSHINGS;  Surgeon: Garner Nash, DO;  Location: [place] [facility];  Service: Pulmonary;;   BRONCHIAL  WASHINGS  04/21/2021   Procedure: BRONCHIAL WASHINGS;  Surgeon: Garner Nash, DO;  Location: [place];  Service: Pulmonary;;   COLONOSCOPY  2017   [place], [place]   CYSTOSCOPY/URETEROSCOPY/HOLMIUM LASER/STENT PLACEMENT Left 05/10/2019   Procedure: CYSTOSCOPY/RETROGRADE/URETEROSCOPY/HOLMIUM LASER/STENT PLACEMENT;  Surgeon: Raynelle Bring, MD;  Location: [facility];  Service: Urology;  Laterality: Left;    FIDUCIAL MARKER PLACEMENT  04/21/2021   Procedure: FIDUCIAL MARKER PLACEMENT;  Surgeon: Garner Nash, DO;  Location: [place] [facility];  Service: Pulmonary;;   FOOT SURGERY Right 10/1985   ankle fusion, at [facility], Dr. Lorelee Cover   INTRAMEDULLARY (IM) NAIL INTERTROCHANTERIC Right 07/09/2021   Procedure: INTRAMEDULLARY (IM) NAIL INTERTROCHANTRIC;  Surgeon: Rod Can, MD;  Location: [facility];  Service: Orthopedics;  Laterality: Right;   POLYPECTOMY  2017   multiple adenomas   TUBAL LIGATION  1981   VIDEO BRONCHOSCOPY WITH RADIAL ENDOBRONCHIAL ULTRASOUND  04/21/2021   Procedure: RADIAL ENDOBRONCHIAL ULTRASOUND;  Surgeon: Garner Nash, DO;  Location: [facility];  Service: Pulmonary;;   WISDOM TOOTH EXTRACTION      Current Medications: Current Meds  Medication Sig   acetaminophen (TYLENOL) 325 MG tablet Take 650 mg by mouth every 4 (four) hours as needed for headache or mild pain.   baclofen (LIORESAL) 10 MG tablet Take 10 mg by mouth at bedtime.   Calcium Carbonate 500 MG CHEW Chew 500 mg by mouth daily.   fexofenadine (ALLEGRA) 180 MG tablet Take 180 mg by mouth daily.   guaiFENesin (MUCINEX) 600 MG 12 hr tablet Take 600 mg by mouth 2 (two) times daily as needed for cough.   Multiple Vitamins-Minerals (THEREMS-M) TABS Take 1 tablet by mouth daily.   nystatin (MYCOSTATIN/NYSTOP) powder Apply 1 application. topically 2 (two) times daily as needed (rash under breast).   Omega-3 Fatty Acids (OMEGA-3 FISH OIL PO) Take 1 capsule by mouth daily.   polyethylene glycol (MIRALAX / GLYCOLAX) 17 g packet Take 17 g by mouth daily as needed for mild constipation.   Propylene Glycol (SYSTANE BALANCE) 0.6 % SOLN Place 1 drop into both eyes 2 (two) times daily.   rosuvastatin (CRESTOR) 10 MG tablet Take 1 tablet (10 mg total) by mouth daily.   tiZANidine (ZANAFLEX) 2 MG tablet Take 1 tablet as needed once a day for headache (Give Tizanidine instead of Tylenol)   topiramate (TOPAMAX) 100 MG tablet Take 300 mg  by mouth 2 (two) times daily.   Vitamin D3 (VITAMIN D) 25 MCG tablet Take 1,000 Units by mouth daily.   zinc oxide 20 % ointment Apply 1 application. topically as needed (after every incontinent episode).   [DISCONTINUED] Calcium Citrate-Vitamin D 315-5 MG-MCG TABS Take 1 tablet by mouth daily.   [DISCONTINUED] Docosahexaenoic Acid (DHA NATURAL OMEGA-3 PO) Take 1 capsule by mouth daily. Omega-3 DHA (DR/EC) '350mg'$ -'325mg'$ -'90mg'$ -'597mg'$    [DISCONTINUED] predniSONE (DELTASONE) 20 MG tablet Take 3 tabs on day 1, 2 and 1/2 tabs on day 2, 2 tabs on day 3, 1 and 1/2 tabs on day 4, 1 tab on day 5, 1/2 tab on days 6 and 7, then stop.   [DISCONTINUED] senna (SENOKOT) 8.6 MG TABS tablet Take 1 tablet (8.6 mg total) by mouth 2 (two) times daily.     Allergies:   Aspirin, Cheese, Chocolate, Coffee flavor, Other, and Red wine complex [germanium]   Social History   Socioeconomic History   Marital status: Single    Spouse name: Not on file   Number of children: Not on file   Years of  education: Not on file   Highest education level: Not on file  Occupational History   Not on file  Tobacco Use   Smoking status: Never    Passive exposure: Past   Smokeless tobacco: Never  Vaping Use   Vaping Use: Never used  Substance and Sexual Activity   Alcohol use: No   Drug use: No   Sexual activity: Not Currently    Birth control/protection: None    Comment: 1st intercourse 67 yo-Fewer than 5 partners  Other Topics Concern   Not on file  Social History Narrative   Right handed until 3rd grade    Left handed   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    Social Determinants of Health   Financial Resource Strain: Not on file  Food Insecurity: Not on file  Transportation Needs: Not on file  Physical Activity: Inactive (08/22/2018)   Exercise Vital Sign    Days of Exercise per Week: 0 days    Minutes of Exercise per Session: 0 min  Stress: No Stress Concern Present (08/22/2018)   [place]    Feeling of Stress : Only a little  Social Connections: Not on file     Family History: The patient's family history includes CAD in her maternal uncle; COPD in her maternal uncle; Colon polyps in her father; Dementia in her father and paternal uncle; Diabetes in her father; Heart attack in her maternal grandfather and mother; Heart disease in her mother; Heart failure in her mother; Hyperlipidemia in her father and paternal uncle; Hypertension in her paternal uncle; Melanoma in her father; Prostate cancer in her father; Stroke in her paternal grandmother. There is no history of Breast cancer, Colon cancer, Esophageal cancer, Stomach cancer, or Rectal cancer.  ROS:   Please see the history of present illness.    All other systems reviewed and are negative.  EKGs/Labs/Other Studies Reviewed:    The following studies were reviewed today:  Echo 07/09/21 1. Left ventricular ejection fraction, by estimation, is 55 to 60%. The  left ventricle has normal function. Left ventricular endocardial border  not optimally defined to evaluate regional wall motion. There is mild  concentric left ventricular  hypertrophy. Left ventricular diastolic parameters are consistent with  Grade I diastolic dysfunction (impaired relaxation).   2. Right ventricular systolic function was not well visualized. The right  ventricular size is normal. There is normal pulmonary artery systolic  pressure.   3. The mitral valve is grossly normal. No evidence of mitral valve  regurgitation.   4. The aortic valve is tricuspid. Aortic valve regurgitation is not  visualized. No aortic stenosis is present.   5. The inferior vena cava is normal in size with greater than 50%  respiratory variability, suggesting right atrial pressure of 3 mmHg.   EKG:  EKG is ordered today.  The ekg ordered today demonstrates SR, HR 74 bpm with left axis deviation, slow r-wave progression,  consistent with previous EKG tracings.   Recent Labs: 07/12/2021: ALT 10 07/15/2021: TSH 3.039 07/17/2021: Magnesium 1.7 08/07/2021: BUN 14; Creatinine 1.2; Hemoglobin 10.7; Platelets 267; Potassium 3.6; Sodium 142  Recent Lipid Panel    Component Value Date/Time   CHOL 158 07/09/2021 0456   CHOL 158 06/17/2021 1132   TRIG 74 07/09/2021 0456   HDL 58 07/09/2021 0456   HDL 63 06/17/2021 1132   CHOLHDL 2.7 07/09/2021 0456   VLDL 15 07/09/2021 0456   LDLCALC 85  07/09/2021 0456   LDLCALC 80 06/17/2021 1132   LDLCALC 101 (H) 04/03/2021 1003     Risk Assessment/Calculations:                Physical Exam:    VS:  BP 128/76   Pulse 74   Ht '5\' 6"'$  (1.676 m)   Wt 165 lb 9.6 oz (75.1 kg)   SpO2 97%   BMI 26.73 kg/m     Wt Readings from Last 3 Encounters:  07/01/22 165 lb 9.6 oz (75.1 kg)  05/11/22 161 lb 12.8 oz (73.4 kg)  04/15/22 163 lb 3.2 oz (74 kg)     GEN:  Well nourished, well developed in no acute distress HEENT: Normal NECK: No JVD; No carotid bruits LYMPHATICS: No lymphadenopathy CARDIAC: RRR, no murmurs, rubs, gallops RESPIRATORY:  Clear to auscultation without rales, wheezing or rhonchi  ABDOMEN: Soft, non-tender, non-distended MUSCULOSKELETAL:  No edema; RUA contracted s/p CVA  SKIN: Warm and dry NEUROLOGIC:  Alert and oriented x 3 PSYCHIATRIC:  Normal affect   ASSESSMENT:    1. Coronary artery calcification   2. History of CVA (cerebrovascular accident)   3. Hyperlipidemia, mixed    PLAN:    In order of problems listed above:  Coronary artery calcification - incidental finding on CT of her chest for f/u of pulmonary nodules in October 2022, Stable with no anginal symptoms. No indication for ischemic evaluation. Due to her history of cerebral AVMs, not a candidate for antiplatelet therapy.   History of CVA - s/p CVA x 5 with right sided deficit, followed by neurology.   HLD - recent LDL on 07/09/21 85, Rosuvastatin 10 mg daily was started at that  time, will repeat direct LDL and LFTs today.   Labile HTN - BP today initially elevated at 140/84, recheck was 128/76, known history of labile HTN. Continue to monitor BP at facility.   Disposition-check LDL and LFTs today.  Return in 6 to 8 months with Dr. Gasper Sells given lability of BP.           Medication Adjustments/Labs and Tests Ordered: Current medicines are reviewed at length with the patient today.  Concerns regarding medicines are outlined above.  Orders Placed This Encounter  Procedures   Direct LDL   Hepatic function panel   EKG 12-Lead   No orders of the defined types were placed in this encounter.   Patient Instructions  Medication Instructions:   Your physician recommends that you continue on your current medications as directed. Please refer to the Current Medication list given to you today.   *If you need a refill on your cardiac medications before your next appointment, please call your pharmacy*   Lab Work:  TODAY!!!! Direct LDL/LFT  If you have labs (blood work) drawn today and your tests are completely normal, you will receive your results only by: [place] (if you have MyChart) OR A paper copy in the mail If you have any lab test that is abnormal or we need to change your treatment, we will call you to review the results.   Testing/Procedures:  None ordered.   Follow-Up: At [facility], you and your health needs are our priority.  As part of our continuing mission to provide you with exceptional heart care, we have created designated Provider Care Teams.  These Care Teams include your primary Cardiologist (physician) and Advanced Practice Providers (APPs -  Physician Assistants and Nurse Practitioners) who all work together to provide you with  the care you need, when you need it.  We recommend signing up for the patient portal called "MyChart".  Sign up information is provided on this After Visit Summary.  MyChart is used to  connect with patients for Virtual Visits (Telemedicine).  Patients are able to view lab/test results, encounter notes, upcoming appointments, etc.  Non-urgent messages can be sent to your provider as well.   To learn more about what you can do with MyChart, go to [email or web address].    Your next appointment:   7 month(s)  Provider:   Werner Lean, MD  {If Card or EP not listed click to update   DO NOT delete brackets or number around this link :    Signed, Trudi Ida, NP  07/01/2022 11:05 AM    [place]

## 2022-07-01 ENCOUNTER — Encounter: Payer: Self-pay | Admitting: Physician Assistant

## 2022-07-01 ENCOUNTER — Ambulatory Visit: Payer: Medicare Other | Attending: Physician Assistant | Admitting: Cardiology

## 2022-07-01 VITALS — BP 128/76 | HR 74 | Ht 66.0 in | Wt 165.6 lb

## 2022-07-01 DIAGNOSIS — I2584 Coronary atherosclerosis due to calcified coronary lesion: Secondary | ICD-10-CM

## 2022-07-01 DIAGNOSIS — I2510 Atherosclerotic heart disease of native coronary artery without angina pectoris: Secondary | ICD-10-CM | POA: Diagnosis not present

## 2022-07-01 DIAGNOSIS — E782 Mixed hyperlipidemia: Secondary | ICD-10-CM | POA: Diagnosis not present

## 2022-07-01 DIAGNOSIS — Z8673 Personal history of transient ischemic attack (TIA), and cerebral infarction without residual deficits: Secondary | ICD-10-CM | POA: Diagnosis not present

## 2022-07-01 DIAGNOSIS — M24541 Contracture, right hand: Secondary | ICD-10-CM | POA: Diagnosis not present

## 2022-07-01 DIAGNOSIS — M25631 Stiffness of right wrist, not elsewhere classified: Secondary | ICD-10-CM | POA: Diagnosis not present

## 2022-07-01 NOTE — Patient Instructions (Signed)
Medication Instructions:   Your physician recommends that you continue on your current medications as directed. Please refer to the Current Medication list given to you today.   *If you need a refill on your cardiac medications before your next appointment, please call your pharmacy*   Lab Work:  TODAY!!!! Direct LDL/LFT  If you have labs (blood work) drawn today and your tests are completely normal, you will receive your results only by: [place] (if you have MyChart) OR A paper copy in the mail If you have any lab test that is abnormal or we need to change your treatment, we will call you to review the results.   Testing/Procedures:  None ordered.   Follow-Up: At [facility], you and your health needs are our priority.  As part of our continuing mission to provide you with exceptional heart care, we have created designated Provider Care Teams.  These Care Teams include your primary Cardiologist (physician) and Advanced Practice Providers (APPs -  Physician Assistants and Nurse Practitioners) who all work together to provide you with the care you need, when you need it.  We recommend signing up for the patient portal called "MyChart".  Sign up information is provided on this After Visit Summary.  MyChart is used to connect with patients for Virtual Visits (Telemedicine).  Patients are able to view lab/test results, encounter notes, upcoming appointments, etc.  Non-urgent messages can be sent to your provider as well.   To learn more about what you can do with MyChart, go to [email or web address].    Your next appointment:   7 month(s)  Provider:   Werner Lean, MD  {If Card or EP not listed click to update   DO NOT delete brackets or number around this link :

## 2022-07-02 ENCOUNTER — Telehealth: Payer: Self-pay | Admitting: Internal Medicine

## 2022-07-02 LAB — HEPATIC FUNCTION PANEL
ALT: 17 IU/L (ref 0–32)
AST: 16 IU/L (ref 0–40)
Albumin: 4 g/dL (ref 3.9–4.9)
Alkaline Phosphatase: 173 IU/L — ABNORMAL HIGH (ref 44–121)
Bilirubin Total: <0.2 mg/dL (ref 0.0–1.2)
Bilirubin, Direct: <0.1 mg/dL (ref 0.00–0.40)
Total Protein: 6.7 g/dL (ref 6.0–8.5)

## 2022-07-02 LAB — LDL CHOLESTEROL, DIRECT: LDL Direct: 66 mg/dL (ref 0–99)

## 2022-07-02 NOTE — Telephone Encounter (Signed)
Caller stating patient has been requesting to hold her Crestor and they will need to have written notification of this medication change.  Caller stated they will be faxing written request.  Caller stated they want orders for the repeat blood drawn sent to them so patient can have lab work done at their location.

## 2022-07-02 NOTE — Telephone Encounter (Signed)
Called Vivien Rota with [facility] was advised that a fax was sent over for [facility] [place] to sign to hold rosuvastatin and repeat LFT in 2 weeks.

## 2022-07-02 NOTE — Telephone Encounter (Signed)
Patient called stating [facility] will do the blood work there.

## 2022-07-03 DIAGNOSIS — M24541 Contracture, right hand: Secondary | ICD-10-CM | POA: Diagnosis not present

## 2022-07-03 DIAGNOSIS — M25631 Stiffness of right wrist, not elsewhere classified: Secondary | ICD-10-CM | POA: Diagnosis not present

## 2022-07-06 DIAGNOSIS — M25631 Stiffness of right wrist, not elsewhere classified: Secondary | ICD-10-CM | POA: Diagnosis not present

## 2022-07-06 DIAGNOSIS — M24541 Contracture, right hand: Secondary | ICD-10-CM | POA: Diagnosis not present

## 2022-07-08 DIAGNOSIS — M25631 Stiffness of right wrist, not elsewhere classified: Secondary | ICD-10-CM | POA: Diagnosis not present

## 2022-07-08 DIAGNOSIS — M24541 Contracture, right hand: Secondary | ICD-10-CM | POA: Diagnosis not present

## 2022-07-12 DIAGNOSIS — M25631 Stiffness of right wrist, not elsewhere classified: Secondary | ICD-10-CM | POA: Diagnosis not present

## 2022-07-12 DIAGNOSIS — M24541 Contracture, right hand: Secondary | ICD-10-CM | POA: Diagnosis not present

## 2022-07-13 DIAGNOSIS — M24541 Contracture, right hand: Secondary | ICD-10-CM | POA: Diagnosis not present

## 2022-07-13 DIAGNOSIS — M25631 Stiffness of right wrist, not elsewhere classified: Secondary | ICD-10-CM | POA: Diagnosis not present

## 2022-07-14 ENCOUNTER — Encounter: Payer: Self-pay | Admitting: Adult Health

## 2022-07-14 ENCOUNTER — Non-Acute Institutional Stay: Payer: Medicare Other | Admitting: Adult Health

## 2022-07-14 DIAGNOSIS — J00 Acute nasopharyngitis [common cold]: Secondary | ICD-10-CM | POA: Diagnosis not present

## 2022-07-14 NOTE — Progress Notes (Signed)
Location:  [name] Room Number: [name] of Service:  [id number]) Provider: Durenda Age, NP   Patient Care Team: Mast, Man X, NP as PCP - General (Internal Medicine) Werner Lean, MD as PCP - Cardiology (Cardiology) Cameron Sprang, MD as Consulting Physician (Neurology) Corliss Parish, MD as Consulting Physician (Nephrology) Raynelle Bring, MD as Consulting Physician (Urology)  Extended Emergency Contact Information Primary Emergency Contact: Barnie Del States of [name] Phone: [phone] Mobile Phone: [phone] Relation: Relative Secondary Emergency Contact: Roper,Paula Address: [place] [address] [name] Phone: [phone] Mobile Phone: [phone] Relation: Sister  Code Status:  DNR Goals of care: Advanced Directive information    07/14/2022    9:20 AM  Advanced Directives  Does Patient Have a Medical Advance Directive? Yes  Type of Paramedic of [place];Living will;Out of facility DNR (pink MOST or yellow form)  Does patient want to make changes to medical advance directive? No - Patient declined  Copy of [place] in Chart? Yes - validated most recent copy scanned in chart (See row information)  Pre-existing out of facility DNR order (yellow form or pink MOST form) Pink MOST/Yellow Form most recent copy in chart - Physician notified to receive inpatient order     Chief Complaint  Patient presents with   Acute Visit    Scratchy throat and cold symptoms     HPI:  Pt is a 67 y.o. female seen today for an acute visit for scratchy throat. She has scatchy throat, cough and general malaise for a week now. No chills nor fever. She was tested for COVID-19 and was negative. She has been gargling with warm water and salt.    Past Medical History:  Diagnosis Date   Acute kidney injury ([place])  09/03/2019   05-2019 kidney stone removed   Allergy    seasonal allergies   Arthritis    LEFT hand   AVM (arteriovenous malformation) brain    Cataract    bilateral-not a surgical candidate at this time (07/23/2020)   COVID-19 virus infection 08/2020   GERD (gastroesophageal reflux disease)    on meds   Hyperlipidemia    diet controlled   Insulin resistance 02/16/2018   Seizures ([place])    [place] & Partial complex seizure disorder-last 1998   Stroke [facility])    5 total so far (age [phone])   Ureteral stone 05/10/2019   has multiple kidney stones noted from Urologist   Uses roller walker    Past Surgical History:  Procedure Laterality Date   BRAIN SURGERY  09/19/1978   at [facility], Dr. Leida Lauth   BRONCHIAL BIOPSY  04/21/2021   Procedure: BRONCHIAL BIOPSIES;  Surgeon: Garner Nash, DO;  Location: [facility];  Service: Pulmonary;;   BRONCHIAL BRUSHINGS  04/21/2021   Procedure: BRONCHIAL BRUSHINGS;  Surgeon: Garner Nash, DO;  Location: [facility];  Service: Pulmonary;;   BRONCHIAL WASHINGS  04/21/2021   Procedure: BRONCHIAL WASHINGS;  Surgeon: Garner Nash, DO;  Location: [facility];  Service: Pulmonary;;   COLONOSCOPY  2017   [place] [place]   CYSTOSCOPY/URETEROSCOPY/HOLMIUM LASER/STENT PLACEMENT Left 05/10/2019   Procedure: CYSTOSCOPY/RETROGRADE/URETEROSCOPY/HOLMIUM LASER/STENT PLACEMENT;  Surgeon: Raynelle Bring, MD;  Location: [facility];  Service: Urology;  Laterality: Left;   FIDUCIAL MARKER PLACEMENT  04/21/2021   Procedure: FIDUCIAL MARKER PLACEMENT;  Surgeon: Garner Nash, DO;  Location: [facility];  Service: Pulmonary;;   FOOT SURGERY Right 10/1985   ankle fusion, at [facility], Dr. Lorelee Cover   INTRAMEDULLARY (IM) NAIL INTERTROCHANTERIC Right 07/09/2021   Procedure: INTRAMEDULLARY (IM) NAIL INTERTROCHANTRIC;  Surgeon: Rod Can, MD;  Location: [facility];  Service: Orthopedics;  Laterality: Right;   POLYPECTOMY  2017   multiple adenomas   TUBAL LIGATION  1981    VIDEO BRONCHOSCOPY WITH RADIAL ENDOBRONCHIAL ULTRASOUND  04/21/2021   Procedure: RADIAL ENDOBRONCHIAL ULTRASOUND;  Surgeon: Garner Nash, DO;  Location: [facility];  Service: Pulmonary;;   WISDOM TOOTH EXTRACTION      Allergies  Allergen Reactions   Aspirin Other (See Comments)    CONTRAINDICATED DUE TO HISTORY OF BLEEDING IN BRAIN   Cheese     Cant take due to history of severe migraines   Chocolate     Cant take due to history of severe migraines   Coffee Flavor     Cant take due to history of severe migraines   Other      Nuts - Cant take due to history of severe migraines   Red Wine Complex [Germanium]     Cant take due to history of severe migraines    Outpatient Encounter Medications as of 07/14/2022  Medication Sig   acetaminophen (TYLENOL) 325 MG tablet Take 650 mg by mouth every 4 (four) hours as needed for headache or mild pain.   baclofen (LIORESAL) 10 MG tablet Take 10 mg by mouth at bedtime.   Calcium Carbonate 500 MG CHEW Chew 500 mg by mouth daily.   fexofenadine (ALLEGRA) 180 MG tablet Take 180 mg by mouth daily.   guaiFENesin (MUCINEX) 600 MG 12 hr tablet Take 600 mg by mouth 2 (two) times daily as needed for cough.   Multiple Vitamins-Minerals (THEREMS-M) TABS Take 1 tablet by mouth daily.   nystatin (MYCOSTATIN/NYSTOP) powder Apply 1 application. topically 2 (two) times daily as needed (rash under breast).   Omega-3 Fatty Acids (OMEGA-3 FISH OIL PO) Take 1 capsule by mouth daily.   polyethylene glycol (MIRALAX / GLYCOLAX) 17 g packet Take 17 g by mouth daily as needed for mild constipation.   Propylene Glycol (SYSTANE BALANCE) 0.6 % SOLN Place 1 drop into both eyes 2 (two) times daily.   rosuvastatin (CRESTOR) 10 MG tablet Take 1 tablet (10 mg total) by mouth daily.   tiZANidine (ZANAFLEX) 2 MG tablet Take 1 tablet as needed once a day for headache (Give Tizanidine instead of Tylenol)   topiramate (TOPAMAX) 100 MG tablet Take 300 mg by mouth 2 (two) times  daily.   Vitamin D3 (VITAMIN D) 25 MCG tablet Take 1,000 Units by mouth daily.   zinc oxide 20 % ointment Apply 1 application. topically as needed (after every incontinent episode).   enoxaparin (LOVENOX) 30 MG/0.3ML injection Inject 0.3 mLs (30 mg total) into the skin daily.   No facility-administered encounter medications on file as of 07/14/2022.    Review of Systems  Constitutional:  Negative for appetite change, chills, fatigue and fever.  HENT:  Positive for sore throat. Negative for congestion, hearing loss and rhinorrhea.   Eyes: Negative.   Respiratory:  Positive for cough. Negative for shortness of breath and wheezing.   Cardiovascular:  Negative for chest pain, palpitations and leg swelling.  Gastrointestinal:  Negative for abdominal pain, constipation, diarrhea, nausea and vomiting.  Genitourinary:  Negative for dysuria.  Musculoskeletal:  Negative for arthralgias, back pain and myalgias.  Skin:  Negative for color  change, rash and wound.  Neurological:  Positive for headaches. Negative for dizziness.  Psychiatric/Behavioral:  Negative for behavioral problems. The patient is not nervous/anxious.     Immunization History  Administered Date(s) Administered   Influenza Inj Mdck Quad Pf 02/16/2017   Influenza Inj Mdck Quad With Preservative 02/16/2018, 03/01/2019   Influenza,inj,Quad PF,6+ Mos 03/01/2017   Influenza,inj,quad, With Preservative 02/18/2015   Influenza-Unspecified 03/01/2017, 12/17/2019, 02/24/2021, 02/22/2022   Moderna Covid-19 Vaccine Bivalent Booster 85yr & up 09/18/2021   PFIZER(Purple Top)SARS-COV-2 Vaccination 07/22/2019, 08/12/2019, 03/10/2020, 11/10/2020, 01/20/2021   PPD Test 07/17/2021, 07/30/2021   Pfizer Covid-19 Vaccine Bivalent Booster 126yr& up 03/04/2022   Pneumococcal Polysaccharide-23 05/03/2004   Pneumococcal-Unspecified 05/03/2004   Respiratory Syncytial Virus Vaccine,Recomb Aduvanted(Arexvy) 04/30/2022   Td 10/19/2019   Td  (Adult),unspecified 10/19/2019   Tdap 08/11/2009   Zoster, Live 04/14/2012   Pertinent  Health Maintenance Due  Topic Date Due   PAP SMEAR-Modifier  12/06/2020   MAMMOGRAM  11/25/2023   COLONOSCOPY (Pts 45-497yrnsurance coverage will need to be confirmed)  01/09/2024   INFLUENZA VACCINE  Completed      07/16/2021   12:00 PM 07/16/2021    8:00 PM 07/29/2021    1:08 PM 02/26/2022    1:30 PM 07/14/2022    9:17 AM  Fall Risk  Falls in the past year?   1 0 0  Was there an injury with Fall?   1 0 0  Fall Risk Category Calculator   2 0 0  Fall Risk Category (Retired)   Moderate Low   (RETIRED) Patient Fall Risk Level High fall risk High fall risk Moderate fall risk High fall risk   Patient at Risk for Falls Due to     History of fall(s)  Fall risk Follow up    Falls evaluation completed Falls evaluation completed   Functional Status Survey:    Vitals:   07/14/22 0911  BP: 132/74  Pulse: 88  Resp: 18  Temp: (!) 97.1 F (36.2 C)  SpO2: 96%  Weight: 161 lb (73 kg)  Height: '5\' 6"'$  (1.676 m)   Body mass index is 25.99 kg/m. Physical Exam Constitutional:      General: She is not in acute distress. HENT:     Head: Normocephalic and atraumatic.     Nose: Nose normal.     Mouth/Throat:     Mouth: Mucous membranes are moist.  Eyes:     Conjunctiva/sclera: Conjunctivae normal.  Cardiovascular:     Rate and Rhythm: Normal rate and regular rhythm.  Pulmonary:     Effort: Pulmonary effort is normal.     Breath sounds: Normal breath sounds.  Abdominal:     General: Bowel sounds are normal.     Palpations: Abdomen is soft.  Musculoskeletal:     Cervical back: Normal range of motion.     Comments: RUE has spastic contracture, Right foot with brace  Skin:    General: Skin is warm and dry.  Neurological:     General: No focal deficit present.     Mental Status: She is alert and oriented to person, place, and time.  Psychiatric:        Mood and Affect: Mood normal.         Behavior: Behavior normal.        Thought Content: Thought content normal.        Judgment: Judgment normal.     Labs reviewed: Recent Labs    07/15/21 0145 07/16/21 0414  07/16/21 1403 07/17/21 0524 08/07/21 0000  NA 133* 126* 135 135 142  K 4.0 3.1*  --  3.7 3.6  CL 112* 103  --  113* 109*  CO2 15* 15*  --  17* 26*  GLUCOSE 90 127*  --  85  --   BUN 23 19  --  18 14  CREATININE 0.84 0.83  --  0.76 1.2*  CALCIUM 8.0* 8.0*  --  8.1* 9.6  MG 2.2  --   --  1.7  --    Recent Labs    07/01/22 1104  AST 16  ALT 17  ALKPHOS 173*  BILITOT <0.2  PROT 6.7  ALBUMIN 4.0   Recent Labs    07/15/21 0145 07/16/21 0414 08/07/21 0000  WBC 10.2 10.3 4.3  NEUTROABS  --   --  1,488.00  HGB 10.4* 10.8* 10.7*  HCT 29.9* 31.7* 33*  MCV 103.5* 104.3*  --   PLT 129* 159 267   Lab Results  Component Value Date   TSH 3.039 07/15/2021   Lab Results  Component Value Date   HGBA1C 4.8 12/19/2020   Lab Results  Component Value Date   CHOL 158 07/09/2021   HDL 58 07/09/2021   LDLCALC 85 07/09/2021   LDLDIRECT 66 07/01/2022   TRIG 74 07/09/2021   CHOLHDL 2.7 07/09/2021    Significant Diagnostic Results in last 30 days:  No results found.  Assessment/Plan  1. Common cold -  tested negartive for COVID-19, no chills nor fever -  continue Fexofenadine and Mucus relief PRN -  continue gargling with warm water and salt TID -  drink plenty of water    Family/ staff Communication:   Discussed plan of care with resident and charge nurse.  Labs/tests ordered:  None

## 2022-07-15 DIAGNOSIS — M25631 Stiffness of right wrist, not elsewhere classified: Secondary | ICD-10-CM | POA: Diagnosis not present

## 2022-07-15 DIAGNOSIS — M24541 Contracture, right hand: Secondary | ICD-10-CM | POA: Diagnosis not present

## 2022-07-15 DIAGNOSIS — I10 Essential (primary) hypertension: Secondary | ICD-10-CM | POA: Diagnosis not present

## 2022-07-19 DIAGNOSIS — M24541 Contracture, right hand: Secondary | ICD-10-CM | POA: Diagnosis not present

## 2022-07-19 DIAGNOSIS — M25631 Stiffness of right wrist, not elsewhere classified: Secondary | ICD-10-CM | POA: Diagnosis not present

## 2022-07-20 DIAGNOSIS — M24541 Contracture, right hand: Secondary | ICD-10-CM | POA: Diagnosis not present

## 2022-07-20 DIAGNOSIS — M25631 Stiffness of right wrist, not elsewhere classified: Secondary | ICD-10-CM | POA: Diagnosis not present

## 2022-07-22 DIAGNOSIS — M25631 Stiffness of right wrist, not elsewhere classified: Secondary | ICD-10-CM | POA: Diagnosis not present

## 2022-07-22 DIAGNOSIS — M24541 Contracture, right hand: Secondary | ICD-10-CM | POA: Diagnosis not present

## 2022-07-26 DIAGNOSIS — M24541 Contracture, right hand: Secondary | ICD-10-CM | POA: Diagnosis not present

## 2022-07-26 DIAGNOSIS — M25631 Stiffness of right wrist, not elsewhere classified: Secondary | ICD-10-CM | POA: Diagnosis not present

## 2022-07-27 ENCOUNTER — Encounter: Payer: Self-pay | Admitting: Nurse Practitioner

## 2022-07-27 ENCOUNTER — Non-Acute Institutional Stay: Payer: Medicare Other | Admitting: Nurse Practitioner

## 2022-07-27 DIAGNOSIS — M150 Primary generalized (osteo)arthritis: Secondary | ICD-10-CM

## 2022-07-27 DIAGNOSIS — G40909 Epilepsy, unspecified, not intractable, without status epilepticus: Secondary | ICD-10-CM | POA: Diagnosis not present

## 2022-07-27 DIAGNOSIS — R7989 Other specified abnormal findings of blood chemistry: Secondary | ICD-10-CM

## 2022-07-27 DIAGNOSIS — G8191 Hemiplegia, unspecified affecting right dominant side: Secondary | ICD-10-CM

## 2022-07-27 DIAGNOSIS — R4189 Other symptoms and signs involving cognitive functions and awareness: Secondary | ICD-10-CM

## 2022-07-27 DIAGNOSIS — B372 Candidiasis of skin and nail: Secondary | ICD-10-CM

## 2022-07-27 DIAGNOSIS — G43909 Migraine, unspecified, not intractable, without status migrainosus: Secondary | ICD-10-CM

## 2022-07-27 DIAGNOSIS — M159 Polyosteoarthritis, unspecified: Secondary | ICD-10-CM | POA: Diagnosis not present

## 2022-07-27 DIAGNOSIS — M25631 Stiffness of right wrist, not elsewhere classified: Secondary | ICD-10-CM | POA: Diagnosis not present

## 2022-07-27 DIAGNOSIS — M24541 Contracture, right hand: Secondary | ICD-10-CM | POA: Diagnosis not present

## 2022-07-27 NOTE — Assessment & Plan Note (Signed)
on Tylenol, Oxycodone for pain, R hip ORIF 07/09/21

## 2022-07-27 NOTE — Assessment & Plan Note (Signed)
difficulty word findings/forming sentences. 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  TSH 3.039 07/15/21 ?

## 2022-07-27 NOTE — Assessment & Plan Note (Signed)
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 takes Baclofen nightly.

## 2022-07-27 NOTE — Assessment & Plan Note (Signed)
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 

## 2022-07-27 NOTE — Assessment & Plan Note (Signed)
rash under the right breast, will apply Nystatin powder to affected area bid x 10 days.

## 2022-07-27 NOTE — Assessment & Plan Note (Addendum)
Elevated alk phos 173n normal ALT/AST 2/29/42, 07/15/22 LFT wnl except Alk phos [id number]), noted 07/02/22 cardiology, hold Crestor x 2 weeks, repeat LFT, continue monitoring.

## 2022-07-27 NOTE — Progress Notes (Addendum)
Location:  [facility] Room Number: [phone] Place of Service:  ALF [phone]) Provider:  Isahi Godwin X, NP  Patient Care Team: Dorice Stiggers X, NP as PCP - General (Internal Medicine) Werner Lean, MD as PCP - Cardiology (Cardiology) Cameron Sprang, MD as Consulting Physician (Neurology) Corliss Parish, MD as Consulting Physician (Nephrology) Raynelle Bring, MD as Consulting Physician (Urology)  Extended Emergency Contact Information Primary Emergency Contact: Barnie Del [place] of [place] Phone: [phone] Mobile Phone: [phone] Relation: Relative Secondary Emergency Contact: Roper,Paula Address: [place] [address] Johnnette Litter of [place] Phone: [phone] Mobile Phone: [phone] Relation: Sister  Code Status:  DNR Goals of care: Advanced Directive information    07/27/2022   12:33 PM  Advanced Directives  Does Patient Have a Medical Advance Directive? Yes  Type of Paramedic of [place];Living will;Out of facility DNR (pink MOST or yellow form)  Does patient want to make changes to medical advance directive? No - Patient declined  Copy of [place] in Chart? Yes - validated most recent copy scanned in chart (See row information)  Pre-existing out of facility DNR order (yellow form or pink MOST form) Pink MOST/Yellow Form most recent copy in chart - Physician notified to receive inpatient order     Chief Complaint  Patient presents with   Acute Visit    Rash under the right breast.    HPI:  Pt is a 67 y.o. female seen today for an acute visit for rash under the right breast.   Elevated alk phos 173 2/29/42 Constipation, stable, takes Senokot, MiraLax             R hip pain, on Tylenol, Oxycodone for pain, R hip ORIF 07/09/21             Dysphagia 2, thin liquid.              GERD, stable             Hypokalemia, K 3.6 08/07/21               CAD, no angina. F/u Cariology.              HTN controlled w/o meds.              HLD 08/10/21 Cardiology Rosuvastatin             Impaired cognition, difficulty word findings/forming sentences. 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  TSH 3.039 07/15/21 Migraines, no flare ups since 2017, Tylenol, Zanaflex, treated with steroids in the past.              Pulmonary nodules, f/u Pulmonology, R upper lobe nodule, had bronchoscopy 04/21/21-negative for malignancy or infection.              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 takes Baclofen nightly.              Hx of cerebral AVMs             CKD III,  Bun/creat 14/1.2 08/07/21             Gait abnormality, patient ambulated with walker, R ankle brace.              Anemia, Hgb 10.7, Iron 93 08/07/21  Past Medical History:  Diagnosis Date   Acute kidney injury ([place]) 09/03/2019   05-2019 kidney stone removed   Allergy    seasonal allergies   Arthritis    LEFT hand   AVM (arteriovenous malformation) brain    Cataract    bilateral-not a surgical candidate at this time (07/23/2020)   COVID-19 virus infection 08/2020   GERD (gastroesophageal reflux disease)    on meds   Hyperlipidemia    diet controlled   Insulin resistance 02/16/2018   Seizures ([place])    Grand Mal & Partial complex seizure disorder-last 1998   Stroke [facility])    5 total so far (age [phone])   Ureteral stone 05/10/2019   has multiple kidney stones noted from Urologist   Uses roller walker    Past Surgical History:  Procedure Laterality Date   BRAIN SURGERY  09/19/1978   at [facility], Dr. Leida Lauth   BRONCHIAL BIOPSY  04/21/2021   Procedure:  BRONCHIAL BIOPSIES;  Surgeon: Garner Nash, DO;  Location: [facility];  Service: Pulmonary;;   BRONCHIAL BRUSHINGS  04/21/2021   Procedure: BRONCHIAL BRUSHINGS;  Surgeon: Garner Nash, DO;  Location: [facility];  Service: Pulmonary;;   BRONCHIAL WASHINGS  04/21/2021   Procedure: BRONCHIAL WASHINGS;  Surgeon: Garner Nash, DO;  Location: [facility];  Service: Pulmonary;;   COLONOSCOPY  2017   [place], [place]   CYSTOSCOPY/URETEROSCOPY/HOLMIUM LASER/STENT PLACEMENT Left 05/10/2019   Procedure: CYSTOSCOPY/RETROGRADE/URETEROSCOPY/HOLMIUM LASER/STENT PLACEMENT;  Surgeon: Raynelle Bring, MD;  Location: [facility];  Service: Urology;  Laterality: Left;   FIDUCIAL MARKER PLACEMENT  04/21/2021   Procedure: FIDUCIAL MARKER PLACEMENT;  Surgeon: Garner Nash, DO;  Location: [facility];  Service: Pulmonary;;   FOOT SURGERY Right 10/1985   ankle fusion, at [facility], Dr. Lorelee Cover   INTRAMEDULLARY (IM) NAIL INTERTROCHANTERIC Right 07/09/2021   Procedure: INTRAMEDULLARY (IM) NAIL INTERTROCHANTRIC;  Surgeon: Rod Can, MD;  Location: [facility];  Service: Orthopedics;  Laterality: Right;   POLYPECTOMY  2017   multiple adenomas   TUBAL LIGATION  1981   VIDEO BRONCHOSCOPY WITH RADIAL ENDOBRONCHIAL ULTRASOUND  04/21/2021   Procedure: RADIAL ENDOBRONCHIAL ULTRASOUND;  Surgeon: Garner Nash, DO;  Location: [facility];  Service: Pulmonary;;   WISDOM TOOTH EXTRACTION      Allergies  Allergen Reactions   Aspirin Other (See Comments)    CONTRAINDICATED DUE TO HISTORY OF BLEEDING IN BRAIN   Cheese     Cant take due to history of severe migraines   Chocolate     Cant take due to history of severe migraines   Coffee Flavor     Cant take due to history of severe migraines   Other      Nuts - Cant take due to history of severe migraines   Red Wine Complex [Germanium]     Cant take due to history of severe migraines    Outpatient Encounter Medications as of 07/27/2022  Medication Sig    acetaminophen (TYLENOL) 325 MG tablet Take 650 mg by mouth every 4 (four) hours as needed for headache or mild pain.   baclofen (LIORESAL) 10 MG tablet Take 10 mg by mouth at bedtime.   Calcium Carbonate 500 MG CHEW Chew 500 mg by mouth daily.  fexofenadine (ALLEGRA) 180 MG tablet Take 180 mg by mouth daily.   guaiFENesin (MUCINEX) 600 MG 12 hr tablet Take 600 mg by mouth 2 (two) times daily as needed for cough.   Multiple Vitamins-Minerals (THEREMS-M) TABS Take 1 tablet by mouth daily.   nystatin (MYCOSTATIN/NYSTOP) powder Apply 1 application. topically 2 (two) times daily as needed (rash under breast).   Omega-3 Fatty Acids (OMEGA-3 FISH OIL PO) Take 1 capsule by mouth daily.   polyethylene glycol (MIRALAX / GLYCOLAX) 17 g packet Take 17 g by mouth daily as needed for mild constipation.   Propylene Glycol (SYSTANE BALANCE) 0.6 % SOLN Place 1 drop into both eyes 2 (two) times daily.   rosuvastatin (CRESTOR) 10 MG tablet Take 1 tablet (10 mg total) by mouth daily.   tiZANidine (ZANAFLEX) 2 MG tablet Take 1 tablet as needed once a day for headache (Give Tizanidine instead of Tylenol)   topiramate (TOPAMAX) 100 MG tablet Take 300 mg by mouth 2 (two) times daily.   Vitamin D3 (VITAMIN D) 25 MCG tablet Take 1,000 Units by mouth daily.   zinc oxide 20 % ointment Apply 1 application. topically as needed (after every incontinent episode).   enoxaparin (LOVENOX) 30 MG/0.3ML injection Inject 0.3 mLs (30 mg total) into the skin daily.   No facility-administered encounter medications on file as of 07/27/2022.    Review of Systems  Constitutional:  Negative for activity change, fatigue and fever.  HENT:  Positive for trouble swallowing. Negative for congestion.   Eyes:  Negative for visual disturbance.       Lateral right peripheral vision lost  Respiratory:  Negative for cough, shortness of breath and wheezing.   Cardiovascular:  Positive for leg swelling.  Gastrointestinal:  Negative for abdominal  pain and constipation.  Genitourinary:  Positive for frequency. Negative for dysuria and urgency.       Chronic  Musculoskeletal:  Positive for arthralgias and gait problem.       R hip pain, better. Right ankle brace. R wrist contracture.   Skin:  Positive for rash.  Neurological:  Positive for speech difficulty and weakness. Negative for seizures and headaches.       Difficulty words finding, memory lapses.   Psychiatric/Behavioral:  Negative for behavioral problems, hallucinations and sleep disturbance. The patient is not nervous/anxious.     Immunization History  Administered Date(s) Administered   Influenza Inj Mdck Quad Pf 02/16/2017   Influenza Inj Mdck Quad With Preservative 02/16/2018, 03/01/2019   Influenza,inj,Quad PF,6+ Mos 03/01/2017   Influenza,inj,quad, With Preservative 02/18/2015   Influenza-Unspecified 03/01/2017, 12/17/2019, 02/24/2021, 02/22/2022   Moderna Covid-19 Vaccine Bivalent Booster 83yrs & up 09/18/2021   PFIZER(Purple Top)SARS-COV-2 Vaccination 07/22/2019, 08/12/2019, 03/10/2020, 11/10/2020, 01/20/2021   PPD Test 07/17/2021, 07/30/2021   Pfizer Covid-19 Vaccine Bivalent Booster 33yrs & up 03/04/2022   Pneumococcal Polysaccharide-23 05/03/2004   Pneumococcal-Unspecified 05/03/2004   Respiratory Syncytial Virus Vaccine,Recomb Aduvanted(Arexvy) 04/30/2022   Td 10/19/2019   Td (Adult),unspecified 10/19/2019   Tdap 08/11/2009   Zoster, Live 04/14/2012   Pertinent  Health Maintenance Due  Topic Date Due   PAP SMEAR-Modifier  12/06/2020   MAMMOGRAM  11/25/2023   COLONOSCOPY (Pts 45-31yrs Insurance coverage will need to be confirmed)  01/09/2024   INFLUENZA VACCINE  Completed      07/16/2021    8:00 PM 07/29/2021    1:08 PM 02/26/2022    1:30 PM 07/14/2022    9:17 AM 07/27/2022   12:32 PM  Fall Risk  Falls in  the past year?  1 0 0 0  Was there an injury with Fall?  1 0 0 0  Fall Risk Category Calculator  2 0 0 0  Fall Risk Category (Retired)  Moderate  Low    (RETIRED) Patient Fall Risk Level High fall risk Moderate fall risk High fall risk    Patient at Risk for Falls Due to    History of fall(s) History of fall(s)  Fall risk Follow up   Falls evaluation completed Falls evaluation completed Falls evaluation completed   Functional Status Survey:    Vitals:   07/27/22 1225  BP: 125/69  Pulse: 77  Resp: 18  Temp: 97.9 F (36.6 C)  SpO2: 97%  Weight: 161 lb (73 kg)  Height: 5\' 6"  (1.676 m)   Body mass index is 25.99 kg/m. Physical Exam Vitals and nursing note reviewed.  Constitutional:      Appearance: Normal appearance.  HENT:     Head: Normocephalic and atraumatic.     Nose: Nose normal.     Mouth/Throat:     Mouth: Mucous membranes are moist.  Eyes:     Extraocular Movements: Extraocular movements intact.     Pupils: Pupils are equal, round, and reactive to light.  Cardiovascular:     Rate and Rhythm: Normal rate and regular rhythm.     Heart sounds: No murmur heard. Pulmonary:     Effort: Pulmonary effort is normal.     Breath sounds: No rales.  Abdominal:     General: Bowel sounds are normal.     Palpations: Abdomen is soft.     Tenderness: There is no abdominal tenderness.  Musculoskeletal:        General: Deformity present.     Cervical back: Normal range of motion and neck supple.     Right lower leg: Edema present.     Left lower leg: Edema present.     Comments: Right wrist contracture, right ankle brace. Trace edema BLE  Skin:    General: Skin is warm and dry.     Findings: Rash present.     Comments: Redness under the right breast skin fold noted.   Neurological:     General: No focal deficit present.     Mental Status: She is alert and oriented to person, place, and time. Mental status is at baseline.     Cranial Nerves: No cranial nerve deficit.     Motor: Weakness present.     Coordination: Coordination abnormal.     Gait: Gait abnormal.     Comments: Right wrist contracture, muscle strength  1/5 RUE, 4-5 RLE. Uses R ankle brace.  Psychiatric:        Mood and Affect: Mood normal.        Behavior: Behavior normal.        Thought Content: Thought content normal.     Labs reviewed: Recent Labs    08/07/21 0000  NA 142  K 3.6  CL 109*  CO2 26*  BUN 14  CREATININE 1.2*  CALCIUM 9.6   Recent Labs    07/01/22 1104  AST 16  ALT 17  ALKPHOS 173*  BILITOT <0.2  PROT 6.7  ALBUMIN 4.0   Recent Labs    08/07/21 0000  WBC 4.3  NEUTROABS 1,488.00  HGB 10.7*  HCT 33*  PLT 267   Lab Results  Component Value Date   TSH 3.039 07/15/2021   Lab Results  Component Value Date   HGBA1C  4.8 12/19/2020   Lab Results  Component Value Date   CHOL 158 07/09/2021   HDL 58 07/09/2021   LDLCALC 85 07/09/2021   LDLDIRECT 66 07/01/2022   TRIG 74 07/09/2021   CHOLHDL 2.7 07/09/2021    Significant Diagnostic Results in last 30 days:  No results found.  Assessment/Plan Candidiasis of skin  rash under the right breast, will apply Nystatin powder to affected area bid x 10 days.   Elevated LFTs Elevated alk phos 173n normal ALT/AST 2/29/42, 07/15/22 LFT wnl except Alk phos [id number]), noted 07/02/22 cardiology, hold Crestor x 2 weeks, repeat LFT, continue monitoring.   Osteoarthritis on Tylenol, Oxycodone for pain, R hip ORIF 07/09/21  Impaired cognition difficulty word findings/forming sentences. 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  TSH 3.039 07/15/21  Migraine  no flare ups since 2017, Tylenol, Zanaflex, treated with steroids in the past.   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 takes Baclofen nightly.       Family/ staff Communication: plan of care reviewed with the patient and charge nurse.   Labs/tests ordered:  none  Time spend 40 minutes.

## 2022-07-27 NOTE — Assessment & Plan Note (Signed)
no flare ups since 2017, Tylenol, Zanaflex, treated with steroids in the past.

## 2022-08-02 DIAGNOSIS — M24541 Contracture, right hand: Secondary | ICD-10-CM | POA: Diagnosis not present

## 2022-08-02 DIAGNOSIS — M25631 Stiffness of right wrist, not elsewhere classified: Secondary | ICD-10-CM | POA: Diagnosis not present

## 2022-08-03 DIAGNOSIS — M25631 Stiffness of right wrist, not elsewhere classified: Secondary | ICD-10-CM | POA: Diagnosis not present

## 2022-08-03 DIAGNOSIS — M24541 Contracture, right hand: Secondary | ICD-10-CM | POA: Diagnosis not present

## 2022-08-05 DIAGNOSIS — M25631 Stiffness of right wrist, not elsewhere classified: Secondary | ICD-10-CM | POA: Diagnosis not present

## 2022-08-05 DIAGNOSIS — M24541 Contracture, right hand: Secondary | ICD-10-CM | POA: Diagnosis not present

## 2022-08-06 DIAGNOSIS — M25631 Stiffness of right wrist, not elsewhere classified: Secondary | ICD-10-CM | POA: Diagnosis not present

## 2022-08-06 DIAGNOSIS — M24541 Contracture, right hand: Secondary | ICD-10-CM | POA: Diagnosis not present

## 2022-08-10 DIAGNOSIS — M25631 Stiffness of right wrist, not elsewhere classified: Secondary | ICD-10-CM | POA: Diagnosis not present

## 2022-08-10 DIAGNOSIS — M24541 Contracture, right hand: Secondary | ICD-10-CM | POA: Diagnosis not present

## 2022-08-11 ENCOUNTER — Encounter: Payer: Self-pay | Admitting: Family Medicine

## 2022-08-12 DIAGNOSIS — M25631 Stiffness of right wrist, not elsewhere classified: Secondary | ICD-10-CM | POA: Diagnosis not present

## 2022-08-12 DIAGNOSIS — M24541 Contracture, right hand: Secondary | ICD-10-CM | POA: Diagnosis not present

## 2022-08-17 ENCOUNTER — Encounter: Payer: Self-pay | Admitting: Nurse Practitioner

## 2022-08-17 ENCOUNTER — Non-Acute Institutional Stay: Payer: Medicare Other | Admitting: Nurse Practitioner

## 2022-08-17 DIAGNOSIS — N1832 Chronic kidney disease, stage 3b: Secondary | ICD-10-CM | POA: Diagnosis not present

## 2022-08-17 DIAGNOSIS — R269 Unspecified abnormalities of gait and mobility: Secondary | ICD-10-CM

## 2022-08-17 DIAGNOSIS — D500 Iron deficiency anemia secondary to blood loss (chronic): Secondary | ICD-10-CM | POA: Diagnosis not present

## 2022-08-17 DIAGNOSIS — G43909 Migraine, unspecified, not intractable, without status migrainosus: Secondary | ICD-10-CM

## 2022-08-17 DIAGNOSIS — G40909 Epilepsy, unspecified, not intractable, without status epilepticus: Secondary | ICD-10-CM

## 2022-08-17 DIAGNOSIS — B372 Candidiasis of skin and nail: Secondary | ICD-10-CM | POA: Diagnosis not present

## 2022-08-17 DIAGNOSIS — K5901 Slow transit constipation: Secondary | ICD-10-CM

## 2022-08-17 DIAGNOSIS — G8191 Hemiplegia, unspecified affecting right dominant side: Secondary | ICD-10-CM | POA: Diagnosis not present

## 2022-08-17 DIAGNOSIS — R4189 Other symptoms and signs involving cognitive functions and awareness: Secondary | ICD-10-CM

## 2022-08-17 NOTE — Assessment & Plan Note (Signed)
patient ambulated with walker, R ankle brace.  

## 2022-08-17 NOTE — Assessment & Plan Note (Signed)
Impaired cognition, difficulty word findings/forming sentences. 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  TSH 3.039 07/15/21 

## 2022-08-17 NOTE — Assessment & Plan Note (Signed)
stable, takes Senokot, MiraLax ?

## 2022-08-17 NOTE — Progress Notes (Unsigned)
Location:  [facility] Room Number: [phone] Place of Service:  ALF [phone]) Provider:  Zethan Alfieri X, NP  Patient Care Team: Caily Rakers X, NP as PCP - General (Internal Medicine) Christell Constant, MD as PCP - Cardiology (Cardiology) Van Clines, MD as Consulting Physician (Neurology) Annie Sable, MD as Consulting Physician (Nephrology) Heloise Purpura, MD as Consulting Physician (Urology)  Extended Emergency Contact Information Primary Emergency Contact: Debbrah Alar [place] of [place] Home Phone: [phone] Mobile Phone: [phone] Relation: Relative Secondary Emergency Contact: Roper,Paula Address: [address]          [place], [place] [address] [place] of [place] Home Phone: [phone] Mobile Phone: [phone] Relation: Sister  Code Status:  DNR  Goals of care: Advanced Directive information    08/18/2022    9:48 AM  Advanced Directives  Does Patient Have a Medical Advance Directive? Yes  Type of Estate agent of [place];Living will;Out of facility DNR (pink MOST or yellow form)  Does patient want to make changes to medical advance directive? No - Patient declined  Copy of Healthcare Power of Attorney in Chart? Yes - validated most recent copy scanned in chart (See row information)  Pre-existing out of facility DNR order (yellow form or pink MOST form) Pink MOST form placed in chart (order not valid for inpatient use);Yellow form placed in chart (order not valid for inpatient use)     Chief Complaint  Patient presents with   Medical Management of Chronic Issues    Routine Visit   Quality Metric Gaps    Needs to discuss Pap smear-Modifier,Shingrix and Medicare Annual Wellness    HPI:  Pt is a 67 y.o. female seen today for medical management of chronic diseases.     Rash under R+L breast, recurred after 10 day course of Nystatin powder.              Elevated alk phos 173  2/29/42 Constipation, stable, takes Senokot, MiraLax             R hip pain, on Tylenol, Oxycodone for pain, R hip ORIF 07/09/21             Dysphagia 2, thin liquid.              GERD, stable             Hypokalemia, K 3.6 08/07/21             CAD, no angina. F/u Cariology.              HTN controlled w/o meds.              HLD 08/10/21 Cardiology Rosuvastatin             Impaired cognition, difficulty word findings/forming sentences. 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  TSH 3.039 07/15/21 Migraines, no flare ups since 2017, Tylenol, Zanaflex, treated with steroids in the past.              Pulmonary nodules, f/u Pulmonology, R upper lobe nodule, had bronchoscopy 04/21/21-negative for malignancy or infection.              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 takes Baclofen nightly.              Hx of cerebral AVMs             CKD III, Bun/creat 14/1.2 08/07/21             Gait abnormality, patient ambulated with walker, R ankle brace.              Anemia, Hgb 10.7, Iron 93 08/07/21        Past Medical History:  Diagnosis Date   Acute kidney injury 09/03/2019   05-2019 kidney stone removed   Allergy    seasonal allergies   Arthritis    LEFT hand   AVM (arteriovenous malformation) brain    Cataract    bilateral-not a surgical candidate at this time (07/23/2020)   COVID-19 virus infection 08/2020   GERD (gastroesophageal reflux disease)    on meds   Hyperlipidemia    diet controlled   Insulin resistance 02/16/2018   Seizures    Grand Mal & Partial complex seizure disorder-last 1998   Stroke    5 total so far (age [phone])   Ureteral stone 05/10/2019    has multiple kidney stones noted from Urologist   Uses roller walker    Past Surgical History:  Procedure Laterality Date   BRAIN SURGERY  09/19/1978   at [facility], Dr. Berdine Dance   BRONCHIAL BIOPSY  04/21/2021   Procedure: BRONCHIAL BIOPSIES;  Surgeon: Josephine Igo, DO;  Location: [facility];  Service: Pulmonary;;   BRONCHIAL BRUSHINGS  04/21/2021   Procedure: BRONCHIAL BRUSHINGS;  Surgeon: Josephine Igo, DO;  Location: [facility];  Service: Pulmonary;;   BRONCHIAL WASHINGS  04/21/2021   Procedure: BRONCHIAL WASHINGS;  Surgeon: Josephine Igo, DO;  Location: [facility];  Service: Pulmonary;;   COLONOSCOPY  2017   [place]   CYSTOSCOPY/URETEROSCOPY/HOLMIUM LASER/STENT PLACEMENT Left 05/10/2019   Procedure: CYSTOSCOPY/RETROGRADE/URETEROSCOPY/HOLMIUM LASER/STENT PLACEMENT;  Surgeon: Heloise Purpura, MD;  Location: [facility];  Service: Urology;  Laterality: Left;   FIDUCIAL MARKER PLACEMENT  04/21/2021   Procedure: FIDUCIAL MARKER PLACEMENT;  Surgeon: Josephine Igo, DO;  Location: [facility];  Service: Pulmonary;;   FOOT SURGERY Right 10/1985   ankle fusion, at [facility], Dr. Nyra Capes   INTRAMEDULLARY (IM) NAIL INTERTROCHANTERIC Right 07/09/2021   Procedure: INTRAMEDULLARY (IM) NAIL INTERTROCHANTRIC;  Surgeon: Samson Frederic, MD;  Location: [facility];  Service: Orthopedics;  Laterality: Right;   POLYPECTOMY  2017   multiple adenomas   TUBAL LIGATION  1981   VIDEO BRONCHOSCOPY WITH RADIAL ENDOBRONCHIAL ULTRASOUND  04/21/2021   Procedure: RADIAL ENDOBRONCHIAL ULTRASOUND;  Surgeon: Josephine Igo, DO;  Location: [facility];  Service: Pulmonary;;   WISDOM TOOTH EXTRACTION      Allergies  Allergen Reactions   Aspirin Other (See Comments)    CONTRAINDICATED DUE TO HISTORY OF BLEEDING IN BRAIN   Cheese     Cant take due to history of severe migraines   Chocolate     Cant take due to history of severe migraines   Coffee Flavor     Cant take due to history of severe migraines    Other      Nuts - Cant take due to history of severe migraines   Red Wine Complex [Germanium]     Cant take due to history of severe migraines    Outpatient Encounter Medications as of 08/17/2022  Medication  Sig   acetaminophen (TYLENOL) 325 MG tablet Take 650 mg by mouth every 4 (four) hours as needed for headache or mild pain.   baclofen (LIORESAL) 10 MG tablet Take 10 mg by mouth at bedtime.   Calcium Carbonate 500 MG CHEW Chew 500 mg by mouth in the morning and at bedtime.   fexofenadine (ALLEGRA) 180 MG tablet Take 180 mg by mouth daily.   guaiFENesin (MUCINEX) 600 MG 12 hr tablet Take 600 mg by mouth 2 (two) times daily as needed for cough.   Multiple Vitamins-Minerals (THEREMS-M) TABS Take 1 tablet by mouth daily.   nystatin (MYCOSTATIN/NYSTOP) powder Apply 1 application. topically 2 (two) times daily as needed (rash under breast).   Omega-3 Fatty Acids (OMEGA-3 FISH OIL PO) Take 1 capsule by mouth daily.   polyethylene glycol (MIRALAX / GLYCOLAX) 17 g packet Take 17 g by mouth daily as needed for mild constipation.   Propylene Glycol (SYSTANE BALANCE) 0.6 % SOLN Place 1 drop into both eyes 2 (two) times daily.   rosuvastatin (CRESTOR) 10 MG tablet Take 1 tablet (10 mg total) by mouth daily.   tiZANidine (ZANAFLEX) 2 MG tablet Take 1 tablet as needed once a day for headache (Give Tizanidine instead of Tylenol)   topiramate (TOPAMAX) 100 MG tablet Take 300 mg by mouth 2 (two) times daily.   Vitamin D3 (VITAMIN D) 25 MCG tablet Take 1,000 Units by mouth daily.   zinc oxide 20 % ointment Apply 1 application. topically as needed (after every incontinent episode).   [DISCONTINUED] enoxaparin (LOVENOX) 30 MG/0.3ML injection Inject 0.3 mLs (30 mg total) into the skin daily.   No facility-administered encounter medications on file as of 08/17/2022.    Review of Systems  Constitutional:  Negative for activity change, fatigue and fever.  HENT:  Positive for trouble swallowing. Negative for  congestion.   Eyes:  Negative for visual disturbance.       Lateral right peripheral vision lost  Respiratory:  Negative for cough, shortness of breath and wheezing.   Cardiovascular:  Positive for leg swelling.  Gastrointestinal:  Negative for abdominal pain and constipation.  Genitourinary:  Positive for frequency. Negative for dysuria and urgency.       Chronic  Musculoskeletal:  Positive for arthralgias and gait problem.       R hip pain, better. Right ankle brace. R wrist contracture.   Skin:  Positive for rash.  Neurological:  Positive for speech difficulty and weakness. Negative for seizures and headaches.       Difficulty words finding, memory lapses.   Psychiatric/Behavioral:  Negative for behavioral problems, hallucinations and sleep disturbance. The patient is not nervous/anxious.     Immunization History  Administered Date(s) Administered   Influenza Inj Mdck Quad Pf 02/16/2017   Influenza Inj Mdck Quad With Preservative 02/16/2018, 03/01/2019   Influenza,inj,Quad PF,6+ Mos 03/01/2017   Influenza,inj,quad, With Preservative 02/18/2015   Influenza-Unspecified 03/01/2017, 12/17/2019, 02/24/2021, 02/22/2022   Moderna Covid-19 Vaccine Bivalent Booster 36yrs & up 09/18/2021   PFIZER(Purple Top)SARS-COV-2 Vaccination 07/22/2019, 08/12/2019, 03/10/2020, 11/10/2020, 01/20/2021   PPD Test 07/17/2021, 07/30/2021   Pfizer Covid-19 Vaccine Bivalent Booster 81yrs & up 03/04/2022   Pneumococcal Polysaccharide-23 05/03/2004   Pneumococcal-Unspecified 05/03/2004   Respiratory Syncytial Virus Vaccine,Recomb Aduvanted(Arexvy) 04/30/2022   Td 10/19/2019   Td (Adult),unspecified 10/19/2019   Tdap 08/11/2009   Zoster, Live 04/14/2012   Pertinent  Health Maintenance Due  Topic Date Due   PAP SMEAR-Modifier  12/06/2020   INFLUENZA VACCINE  12/02/2022  MAMMOGRAM  11/25/2023   COLONOSCOPY (Pts 45-59yrs Insurance coverage will need to be confirmed)  01/09/2024      07/16/2021    8:00 PM  07/29/2021    1:08 PM 02/26/2022    1:30 PM 07/14/2022    9:17 AM 07/27/2022   12:32 PM  Fall Risk  Falls in the past year?  1 0 0 0  Was there an injury with Fall?  1 0 0 0  Fall Risk Category Calculator  2 0 0 0  Fall Risk Category (Retired)  Moderate Low    (RETIRED) Patient Fall Risk Level High fall risk Moderate fall risk High fall risk    Patient at Risk for Falls Due to    History of fall(s) History of fall(s)  Fall risk Follow up   Falls evaluation completed Falls evaluation completed Falls evaluation completed   Functional Status Survey:    Vitals:   08/17/22 1053  BP: 126/70  Pulse: 74  Resp: 17  Temp: (!) 97.2 F (36.2 C)  SpO2: 95%  Weight: 162 lb (73.5 kg)  Height:  (1.676 m)   Body mass index is 26.15 kg/m. Physical Exam Vitals and nursing note reviewed.  Constitutional:      Appearance: Normal appearance.  HENT:     Head: Normocephalic and atraumatic.     Nose: Nose normal.     Mouth/Throat:     Mouth: Mucous membranes are moist.  Eyes:     Extraocular Movements: Extraocular movements intact.     Pupils: Pupils are equal, round, and reactive to light.  Cardiovascular:     Rate and Rhythm: Normal rate and regular rhythm.     Heart sounds: No murmur heard. Pulmonary:     Effort: Pulmonary effort is normal.     Breath sounds: No rales.  Abdominal:     General: Bowel sounds are normal.     Palpations: Abdomen is soft.     Tenderness: There is no abdominal tenderness.  Musculoskeletal:        General: Deformity present.     Cervical back: Normal range of motion and neck supple.     Right lower leg: Edema present.     Left lower leg: Edema present.     Comments: Right wrist contracture, right ankle brace. Trace edema BLE  Skin:    General: Skin is warm and dry.     Findings: Rash present.     Comments: Redness under R+L breast skin folds.   Neurological:     General: No focal deficit present.     Mental Status: She is alert and oriented to  person, place, and time. Mental status is at baseline.     Cranial Nerves: No cranial nerve deficit.     Motor: Weakness present.     Coordination: Coordination abnormal.     Gait: Gait abnormal.     Comments: Right wrist contracture, muscle strength 1/5 RUE, 4-5 RLE. Uses R ankle brace.  Psychiatric:        Mood and Affect: Mood normal.        Behavior: Behavior normal.        Thought Content: Thought content normal.     Labs reviewed: No results for input(s): "NA", "K", "CL", "CO2", "GLUCOSE", "BUN", "CREATININE", "CALCIUM", "MG", "PHOS" in the last 8760 hours. Recent Labs    07/01/22 1104  AST 16  ALT 17  ALKPHOS 173*  BILITOT <0.2  PROT 6.7  ALBUMIN 4.0   No  results for input(s): "WBC", "NEUTROABS", "HGB", "HCT", "MCV", "PLT" in the last 8760 hours. Lab Results  Component Value Date   TSH 3.039 07/15/2021   Lab Results  Component Value Date   HGBA1C 4.8 12/19/2020   Lab Results  Component Value Date   CHOL 158 07/09/2021   HDL 58 07/09/2021   LDLCALC 85 07/09/2021   LDLDIRECT 66 07/01/2022   TRIG 74 07/09/2021   CHOLHDL 2.7 07/09/2021    Significant Diagnostic Results in last 30 days:  No results found.  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   Right hemiparesis (HCC) R hemiparesis, lateral right peripheral vision deficit, right wrist contracture, as an result of a hemorrhagic stroke at age of 85 and subsequent strokes x5 per patient. takes Baclofen nightly.   Chronic kidney disease, stage 3b (HCC)  Bun/creat 14/1.2 08/07/21  Gait abnormality  patient ambulated with walker, R ankle brace.   Blood loss anemia Hgb 10.7, Iron 93 08/07/21  Impaired cognition  Impaired cognition, difficulty word findings/forming sentences.  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  TSH 3.039 07/15/21  Candidiasis of skin Rash under R+L breast, recurred after 10 day course of Nystatin powder. Will try Nystatin cream, 0.5% Triamcinolone cream bid x 2 weeks.   Slow transit constipation  stable, takes Senokot, MiraLax     Family/ staff Communication: plan of care reviewed with the patient and charge nurse.   Labs/tests ordered:  none  Time spend 40 minutes.

## 2022-08-17 NOTE — Assessment & Plan Note (Signed)
Hgb 10.7, Iron 93 08/07/21 

## 2022-08-17 NOTE — Assessment & Plan Note (Signed)
no flare ups since 2017, Tylenol, Zanaflex, treated with steroids in the past.  

## 2022-08-17 NOTE — Assessment & Plan Note (Signed)
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 

## 2022-08-17 NOTE — Assessment & Plan Note (Signed)
R hemiparesis, lateral right peripheral vision deficit, right wrist contracture, as an result of a hemorrhagic stroke at age of 20 and subsequent strokes x5 per patient. takes Baclofen nightly.  

## 2022-08-17 NOTE — Assessment & Plan Note (Signed)
Rash under R+L breast, recurred after 10 day course of Nystatin powder. Will try Nystatin cream, 0.5% Triamcinolone cream bid x 2 weeks.

## 2022-08-17 NOTE — Assessment & Plan Note (Signed)
Bun/creat 14/1.2 08/07/21 

## 2022-08-18 ENCOUNTER — Encounter: Payer: Self-pay | Admitting: Nurse Practitioner

## 2022-08-18 ENCOUNTER — Non-Acute Institutional Stay (INDEPENDENT_AMBULATORY_CARE_PROVIDER_SITE_OTHER): Payer: Medicare Other | Admitting: Nurse Practitioner

## 2022-08-18 DIAGNOSIS — Z0000 Encounter for general adult medical examination without abnormal findings: Secondary | ICD-10-CM | POA: Diagnosis not present

## 2022-08-18 NOTE — Progress Notes (Signed)
Subjective:   Tonya Thompson is a 67 y.o. female who presents for Medicare Annual (Subsequent) preventive examination @ [facility] [address].  Cardiac Risk Factors include: advanced age (>77men, >28 women)     Objective:    Today's Vitals   08/18/22 0946 08/18/22 1210  BP: 126/70   Pulse: 74   Resp: 17   Temp: (!) 97.2 F (36.2 C)   SpO2: 95%   Weight: 162 lb (73.5 kg)   Height: 5\' 6"  (1.676 m)   PainSc:  2    Body mass index is 26.15 kg/m.     08/18/2022    9:48 AM 08/17/2022   11:06 AM 07/27/2022   12:33 PM 07/14/2022    9:20 AM 02/26/2022    1:31 PM 07/29/2021    1:09 PM 07/23/2021    1:53 PM  Advanced Directives  Does Patient Have a Medical Advance Directive? Yes Yes Yes Yes Yes Yes Yes  Type of Estate agent of [place];Living will;Out of facility DNR (pink MOST or yellow form) Healthcare Power of [place];Living will;Out of facility DNR (pink MOST or yellow form) Healthcare Power of [place];Living will;Out of facility DNR (pink MOST or yellow form) Healthcare Power of [place];Living will;Out of facility DNR (pink MOST or yellow form) Healthcare Power of [place];Living will  Healthcare Power of [place];Living will;Out of facility DNR (pink MOST or yellow form)  Does patient want to make changes to medical advance directive? No - Patient declined No - Patient declined No - Patient declined No - Patient declined   No - Patient declined  Copy of Healthcare Power of Attorney in Chart? Yes - validated most recent copy scanned in chart (See row information) Yes - validated most recent copy scanned in chart (See row information) Yes - validated most recent copy scanned in chart (See row information) Yes - validated most recent copy scanned in chart (See row information)   No - copy requested  Pre-existing out of facility DNR order (yellow form or pink MOST form) Pink MOST form placed in chart (order not valid for inpatient use);Yellow form placed in  chart (order not valid for inpatient use) Pink MOST/Yellow Form most recent copy in chart - Physician notified to receive inpatient order Pink MOST/Yellow Form most recent copy in chart - Physician notified to receive inpatient order Pink MOST/Yellow Form most recent copy in chart - Physician notified to receive inpatient order   Yellow form placed in chart (order not valid for inpatient use);Pink MOST form placed in chart (order not valid for inpatient use)    Current Medications (verified) Outpatient Encounter Medications as of 08/18/2022  Medication Sig   acetaminophen (TYLENOL) 325 MG tablet Take 650 mg by mouth every 4 (four) hours as needed for headache or mild pain.   baclofen (LIORESAL) 10 MG tablet Take 10 mg by mouth at bedtime.   Calcium Carbonate 500 MG CHEW Chew 500 mg by mouth in the morning and at bedtime.   fexofenadine (ALLEGRA) 180 MG tablet Take 180 mg by mouth daily.   guaiFENesin (MUCINEX) 600 MG 12 hr tablet Take 600 mg by mouth 2 (two) times daily as needed for cough.   Multiple Vitamins-Minerals (THEREMS-M) TABS Take 1 tablet by mouth daily.   nystatin (MYCOSTATIN/NYSTOP) powder Apply 1 application. topically 2 (two) times daily as needed (rash under breast).   Omega-3 Fatty Acids (OMEGA-3 FISH OIL PO) Take 1 capsule by mouth daily.   omeprazole (PRILOSEC) 20 MG capsule Take 20  mg by mouth daily.   polyethylene glycol (MIRALAX / GLYCOLAX) 17 g packet Take 17 g by mouth daily as needed for mild constipation.   Propylene Glycol (SYSTANE BALANCE) 0.6 % SOLN Place 1 drop into both eyes 2 (two) times daily.   rosuvastatin (CRESTOR) 10 MG tablet Take 1 tablet (10 mg total) by mouth daily.   tiZANidine (ZANAFLEX) 2 MG tablet Take 1 tablet as needed once a day for headache (Give Tizanidine instead of Tylenol)   topiramate (TOPAMAX) 100 MG tablet Take 300 mg by mouth 2 (two) times daily.   triamcinolone cream (KENALOG) 0.5 % Apply 1 Application topically 3 (three) times daily. Apply  to UNDER BREAST topically two times a day for RASH for 14 Days   Vitamin D3 (VITAMIN D) 25 MCG tablet Take 1,000 Units by mouth daily.   zinc oxide 20 % ointment Apply 1 application. topically as needed (after every incontinent episode).   No facility-administered encounter medications on file as of 08/18/2022.    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 Berdine Dance   BRONCHIAL BIOPSY  04/21/2021   Procedure: BRONCHIAL BIOPSIES;  Surgeon: Josephine Igo, DO;  Location: [facility];  Service: Pulmonary;;   BRONCHIAL BRUSHINGS  04/21/2021   Procedure: BRONCHIAL BRUSHINGS;  Surgeon: Josephine Igo, DO;  Location: [facility];  Service: Pulmonary;;   BRONCHIAL WASHINGS  04/21/2021   Procedure: BRONCHIAL WASHINGS;  Surgeon: Josephine Igo, DO;  Location: [facility];  Service: Pulmonary;;   COLONOSCOPY  2017   [place]   CYSTOSCOPY/URETEROSCOPY/HOLMIUM LASER/STENT PLACEMENT Left 05/10/2019   Procedure: CYSTOSCOPY/RETROGRADE/URETEROSCOPY/HOLMIUM LASER/STENT PLACEMENT;  Surgeon: Heloise Purpura, MD;  Location: [facility];  Service: Urology;  Laterality: Left;   FIDUCIAL  MARKER PLACEMENT  04/21/2021   Procedure: FIDUCIAL MARKER PLACEMENT;  Surgeon: Josephine Igo, DO;  Location: [facility];  Service: Pulmonary;;   FOOT SURGERY Right 10/1985   ankle fusion, at [facility], Dr. Nyra Capes   INTRAMEDULLARY (IM) NAIL INTERTROCHANTERIC Right 07/09/2021   Procedure: INTRAMEDULLARY (IM) NAIL INTERTROCHANTRIC;  Surgeon: Samson Frederic, MD;  Location: [facility];  Service: Orthopedics;  Laterality: Right;   POLYPECTOMY  2017   multiple adenomas   TUBAL LIGATION  1981   VIDEO BRONCHOSCOPY WITH RADIAL ENDOBRONCHIAL ULTRASOUND  04/21/2021   Procedure: RADIAL ENDOBRONCHIAL ULTRASOUND;  Surgeon: Josephine Igo, DO;  Location: [facility];  Service: Pulmonary;;   WISDOM TOOTH EXTRACTION     Family History  Problem Relation Age of Onset   Heart disease Mother    Heart failure Mother    Heart attack  Mother    Diabetes Father    Hyperlipidemia Father    Dementia Father    Prostate cancer Father    Melanoma Father    Colon polyps Father    Hyperlipidemia Paternal Uncle    Hypertension Paternal Uncle    Dementia Paternal Uncle    Heart attack Maternal Grandfather    Stroke Paternal Grandmother    CAD Maternal Uncle    COPD Maternal Uncle        smoker   Breast cancer Neg Hx    Colon cancer Neg Hx    Esophageal cancer Neg Hx    Stomach cancer Neg Hx    Rectal cancer Neg Hx    Social History   Socioeconomic History   Marital status: Single    Spouse name: Not on file   Number of children: Not on file   Years of education: Not on file   Highest education level: Not on file  Occupational History   Not on file  Tobacco Use   Smoking status: Never    Passive exposure: Past   Smokeless tobacco: Never  Vaping Use   Vaping Use: Never used  Substance and Sexual Activity   Alcohol use: No   Drug use: No   Sexual activity: Not Currently    Birth control/protection: None    Comment: 1st intercourse 67 yo-Fewer than 5 partners  Other Topics Concern   Not on file   Social History Narrative   Right handed until 3rd grade    Left handed   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    Social Determinants of Health   Financial Resource Strain: Not on file  Food Insecurity: Not on file  Transportation Needs: Not on file  Physical Activity: Inactive (08/22/2018)   Exercise Vital Sign    Days of Exercise per Week: 0 days    Minutes of Exercise per Session: 0 min  Stress: No Stress Concern Present (08/22/2018)   [employer] of Occupational Health - Occupational Stress Questionnaire    Feeling of Stress : Only a little  Social Connections: Not on file    Tobacco Counseling Counseling given: Not Answered   Clinical Intake:  Pre-visit preparation completed: Yes  Pain : 0-10 Pain Score: 2  Pain Type: Chronic pain Pain Location: Leg Pain Orientation: Right Pain Descriptors / Indicators: Aching, Dull Pain Onset: More than a month ago Pain Frequency: Several days a week Pain Relieving Factors: pain meds, rest. Effect of Pain on Daily Activities: none  Pain Relieving Factors: pain meds, rest.  BMI - recorded: 26.15 Nutritional Status: BMI 25 -29 Overweight Nutritional Risks: None Diabetes: No  How often do you need to have someone help you when you read instructions, pamphlets, or other written materials from your doctor or pharmacy?: 2 - Rarely What is the last grade level you completed in school?: high school  Diabetic?no  Interpreter Needed?: No  Information entered by :: Chania Kochanski Nedra Hai NP   Activities of Daily Living    08/18/2022   12:13 PM  In your present state of health, do you have any difficulty performing the following activities:  Hearing? 0  Vision? 0  Difficulty concentrating or making decisions? 0  Walking or climbing stairs? 1  Dressing or bathing? 1  Doing errands, shopping? 1  Preparing Food and eating ? N  Using the Toilet? N  In the past six months, have you accidently leaked urine? Y  Do you have  problems with  loss of bowel control? N  Managing your Medications? Y  Managing your Finances? Y  Housekeeping or managing your Housekeeping? Y    Patient Care Team: Child Campoy X, NP as PCP - General (Internal Medicine) Christell Constant, MD as PCP - Cardiology (Cardiology) Van Clines, MD as Consulting Physician (Neurology) Annie Sable, MD as Consulting Physician (Nephrology) Heloise Purpura, MD as Consulting Physician (Urology)  Indicate any recent Medical Services you may have received from other than [facility] providers in the past year (date may be approximate).     Assessment:   This is a routine wellness examination for [place].  Hearing/Vision screen No results found.  Dietary issues and exercise activities discussed: Current Exercise Habits: The patient does not participate in regular exercise at present, Exercise limited by: neurologic condition(s);orthopedic condition(s)   Goals Addressed             This Visit's Progress    Maintain Mobility and Function       Evidence-based guidance:  Acknowledge and validate impact of pain, loss of strength and potential disfigurement (hand osteoarthritis) on mental health and daily life, such as social isolation, anxiety, depression, impaired sexual relationship and   injury from falls.  Anticipate referral to physical or occupational therapy for assessment, therapeutic exercise and recommendation for adaptive equipment or assistive devices; encourage participation.  Assess impact on ability to perform activities of daily living, as well as engage in sports and leisure events or requirements of work or school.  Provide anticipatory guidance and reassurance about the benefit of exercise to maintain function; acknowledge and normalize fear that exercise may worsen symptoms.  Encourage regular exercise, at least 10 minutes at a time for 45 minutes per week; consider yoga, water exercise and proprioceptive exercises;  encourage use of wearable activity tracker to increase motivation and adherence.  Encourage maintenance or resumption of daily activities, including employment, as pain allows and with minimal exposure to trauma.  Assist patient to advocate for adaptations to the work environment.  Consider level of pain and function, gender, age, lifestyle, patient preference, quality of life, readiness and ?ocapacity to benefit? when recommending patients for orthopaedic surgery consultation.  Explore strategies, such as changes to medication regimen or activity that enables patient to anticipate and manage flare-ups that increase deconditioning and disability.  Explore patient preferences; encourage exposure to a broader range of activities that have been avoided for fear of experiencing pain.  Identify barriers to participation in therapy or exercise, such as pain with activity, anticipated or imagined pain.  Monitor postoperative joint replacement or any preexisting joint replacement for ongoing pain and loss of function; provide social support and encouragement throughout recovery.   Notes:        Depression Screen    07/27/2022   12:32 PM 07/14/2022    9:17 AM 01/27/2021    9:14 PM 12/19/2020    9:08 AM 07/21/2020    6:00 PM 06/26/2020    9:52 AM 12/17/2019    9:33 PM  PHQ 2/9 Scores  PHQ - 2 Score 0 0 0 0 0 0 0    Fall Risk    07/27/2022   12:32 PM 07/14/2022    9:17 AM 02/26/2022    1:30 PM 07/29/2021    1:08 PM 03/04/2021   11:40 AM  Fall Risk   Falls in the past year? 0 0 0 1   Number falls in past yr: 0 0 0 0 0  Injury with Fall? 0 0 0  1 0  Risk for fall due to : History of fall(s) History of fall(s)     Follow up Falls evaluation completed Falls evaluation completed Falls evaluation completed      FALL RISK PREVENTION PERTAINING TO THE HOME:  Any stairs in or around the home? Yes  If so, are there any without handrails? No  Home free of loose throw rugs in walkways, pet beds,  electrical cords, etc? Yes  Adequate lighting in your home to reduce risk of falls? Yes   ASSISTIVE DEVICES UTILIZED TO PREVENT FALLS:  Life alert? No  Use of a cane, walker or w/c? Yes  Grab bars in the bathroom? Yes  Shower chair or bench in shower? Yes  Elevated toilet seat or a handicapped toilet? Yes   TIMED UP AND GO:  Was the test performed? Yes .  Length of time to ambulate 10 feet: 8 sec.   Gait slow and steady with assistive device  Cognitive Function:        Immunizations Immunization History  Administered Date(s) Administered   Influenza Inj Mdck Quad Pf 02/16/2017   Influenza Inj Mdck Quad With Preservative 02/16/2018, 03/01/2019   Influenza,inj,Quad PF,6+ Mos 03/01/2017   Influenza,inj,quad, With Preservative 02/18/2015   Influenza-Unspecified 03/01/2017, 12/17/2019, 02/24/2021, 02/22/2022   Moderna Covid-19 Vaccine Bivalent Booster 58yrs & up 09/18/2021   PFIZER(Purple Top)SARS-COV-2 Vaccination 07/22/2019, 08/12/2019, 03/10/2020, 11/10/2020, 01/20/2021   PPD Test 07/17/2021, 07/30/2021   Pfizer Covid-19 Vaccine Bivalent Booster 11yrs & up 03/04/2022   Pneumococcal Polysaccharide-23 05/03/2004   Pneumococcal-Unspecified 05/03/2004   Respiratory Syncytial Virus Vaccine,Recomb Aduvanted(Arexvy) 04/30/2022   Td 10/19/2019   Td (Adult),unspecified 10/19/2019   Tdap 08/11/2009   Zoster, Live 04/14/2012    TDAP status: Up to date  Flu Vaccine status: Up to date  Pneumococcal vaccine status: Up to date  Covid-19 vaccine status: Completed vaccines  Qualifies for Shingles Vaccine? Yes   Zostavax completed Yes   Shingrix Completed?: No.    Education has been provided regarding the importance of this vaccine. Patient has been advised to call insurance company to determine out of pocket expense if they have not yet received this vaccine. Advised may also receive vaccine at local pharmacy or Health Dept. Verbalized acceptance and understanding.  Screening  Tests Health Maintenance  Topic Date Due   Zoster Vaccines- Shingrix (1 of 2) Never done   PAP SMEAR-Modifier  12/06/2020   COVID-19 Vaccine (8 - 2023-24 season) 05/09/2023 (Originally 04/29/2022)   INFLUENZA VACCINE  12/02/2022   Medicare Annual Wellness (AWV)  08/18/2023   MAMMOGRAM  11/25/2023   COLONOSCOPY (Pts 45-33yrs Insurance coverage will need to be confirmed)  01/09/2024   DTaP/Tdap/Td (4 - Td or Tdap) 10/18/2029   HPV VACCINES  Aged Out   Hepatitis C Screening  Discontinued   HIV Screening  Discontinued    Health Maintenance  Health Maintenance Due  Topic Date Due   Zoster Vaccines- Shingrix (1 of 2) Never done   PAP SMEAR-Modifier  12/06/2020    Colorectal cancer screening: No longer required.   Yearly mammogram, last done 11/24/21  Bone Density status: Completed 2019. Results reflect: Bone density results: OSTEOPENIA. Repeat every 2 years.  Lung Cancer Screening: (Low Dose CT Chest recommended if Age 76-80 years, 30 pack-year currently smoking OR have quit w/in 15years.) does not qualify.    Additional Screening:  Hepatitis C Screening: does not qualify; Completed   Vision Screening: Recommended annual ophthalmology exams for early detection of glaucoma and other disorders  of the eye. Is the patient up to date with their annual eye exam?  No  Who is the provider or what is the name of the office in which the patient attends annual eye exams? [facility] will provide  If pt is not established with a provider, would they like to be referred to a provider to establish care? No .   Dental Screening: Recommended annual dental exams for proper oral hygiene  Community Resource Referral / Chronic Care Management: CRR required this visit?  No   CCM required this visit?  No      Plan:    Orders sent: PAP, Shingrix, DEXA, Eye Exam, MMSE if desires.  I have personally reviewed and noted the following in the patient's chart:   Medical and social history Use of  alcohol, tobacco or illicit drugs  Current medications and supplements including opioid prescriptions. Patient is not currently taking opioid prescriptions. Functional ability and status Nutritional status Physical activity Advanced directives List of other physicians Hospitalizations, surgeries, and ER visits in previous 12 months Vitals Screenings to include cognitive, depression, and falls Referrals and appointments  In addition, I have reviewed and discussed with patient certain preventive protocols, quality metrics, and best practice recommendations. A written personalized care plan for preventive services as well as general preventive health recommendations were provided to patient.     Adwoa Axe X Rector Devonshire, NP   08/18/2022

## 2022-09-14 ENCOUNTER — Ambulatory Visit: Payer: Medicare Other | Admitting: Neurology

## 2022-09-16 ENCOUNTER — Ambulatory Visit: Payer: Medicare Other | Admitting: Neurology

## 2022-09-20 ENCOUNTER — Encounter: Payer: Self-pay | Admitting: Nurse Practitioner

## 2022-09-20 ENCOUNTER — Ambulatory Visit: Payer: Medicare Other | Admitting: Neurology

## 2022-09-20 DIAGNOSIS — W19XXXA Unspecified fall, initial encounter: Secondary | ICD-10-CM | POA: Insufficient documentation

## 2022-10-12 DIAGNOSIS — L600 Ingrowing nail: Secondary | ICD-10-CM | POA: Diagnosis not present

## 2022-10-12 DIAGNOSIS — B351 Tinea unguium: Secondary | ICD-10-CM | POA: Diagnosis not present

## 2022-10-15 ENCOUNTER — Ambulatory Visit (HOSPITAL_BASED_OUTPATIENT_CLINIC_OR_DEPARTMENT_OTHER)
Admission: RE | Admit: 2022-10-15 | Discharge: 2022-10-15 | Disposition: A | Discharge status: Home / Self Care | Payer: Medicare Other | Source: Ambulatory Visit | Attending: Pulmonary Disease | Admitting: Pulmonary Disease

## 2022-10-15 DIAGNOSIS — R911 Solitary pulmonary nodule: Secondary | ICD-10-CM | POA: Insufficient documentation

## 2022-10-15 DIAGNOSIS — R918 Other nonspecific abnormal finding of lung field: Secondary | ICD-10-CM | POA: Diagnosis not present

## 2022-10-26 DIAGNOSIS — I10 Essential (primary) hypertension: Secondary | ICD-10-CM | POA: Diagnosis not present

## 2022-10-28 ENCOUNTER — Other Ambulatory Visit: Payer: Self-pay | Admitting: Family Medicine

## 2022-10-28 DIAGNOSIS — Z1231 Encounter for screening mammogram for malignant neoplasm of breast: Secondary | ICD-10-CM

## 2022-10-30 NOTE — Progress Notes (Signed)
CT Chest stable. Needs repeat ct chest in 1 year. Follow up appt in nodule clinic afterwards.   Thanks,  BLI  Josephine Igo, DO [facility] 10/30/2022 3:37 PM

## 2022-11-02 DIAGNOSIS — M6281 Muscle weakness (generalized): Secondary | ICD-10-CM | POA: Diagnosis not present

## 2022-11-02 DIAGNOSIS — R2689 Other abnormalities of gait and mobility: Secondary | ICD-10-CM | POA: Diagnosis not present

## 2022-11-02 DIAGNOSIS — M25512 Pain in left shoulder: Secondary | ICD-10-CM | POA: Diagnosis not present

## 2022-11-02 DIAGNOSIS — M5459 Other low back pain: Secondary | ICD-10-CM | POA: Diagnosis not present

## 2022-11-03 DIAGNOSIS — M25512 Pain in left shoulder: Secondary | ICD-10-CM | POA: Diagnosis not present

## 2022-11-03 DIAGNOSIS — M5459 Other low back pain: Secondary | ICD-10-CM | POA: Diagnosis not present

## 2022-11-03 DIAGNOSIS — R2689 Other abnormalities of gait and mobility: Secondary | ICD-10-CM | POA: Diagnosis not present

## 2022-11-03 DIAGNOSIS — M6281 Muscle weakness (generalized): Secondary | ICD-10-CM | POA: Diagnosis not present

## 2022-11-08 DIAGNOSIS — M25512 Pain in left shoulder: Secondary | ICD-10-CM | POA: Diagnosis not present

## 2022-11-08 DIAGNOSIS — M6281 Muscle weakness (generalized): Secondary | ICD-10-CM | POA: Diagnosis not present

## 2022-11-08 DIAGNOSIS — M5459 Other low back pain: Secondary | ICD-10-CM | POA: Diagnosis not present

## 2022-11-08 DIAGNOSIS — R2689 Other abnormalities of gait and mobility: Secondary | ICD-10-CM | POA: Diagnosis not present

## 2022-11-10 DIAGNOSIS — R2689 Other abnormalities of gait and mobility: Secondary | ICD-10-CM | POA: Diagnosis not present

## 2022-11-10 DIAGNOSIS — M6281 Muscle weakness (generalized): Secondary | ICD-10-CM | POA: Diagnosis not present

## 2022-11-10 DIAGNOSIS — M5459 Other low back pain: Secondary | ICD-10-CM | POA: Diagnosis not present

## 2022-11-10 DIAGNOSIS — M25512 Pain in left shoulder: Secondary | ICD-10-CM | POA: Diagnosis not present

## 2022-11-11 ENCOUNTER — Encounter: Payer: Self-pay | Admitting: Emergency Medicine

## 2022-11-12 DIAGNOSIS — M25512 Pain in left shoulder: Secondary | ICD-10-CM | POA: Diagnosis not present

## 2022-11-12 DIAGNOSIS — M5459 Other low back pain: Secondary | ICD-10-CM | POA: Diagnosis not present

## 2022-11-12 DIAGNOSIS — R2689 Other abnormalities of gait and mobility: Secondary | ICD-10-CM | POA: Diagnosis not present

## 2022-11-12 DIAGNOSIS — M6281 Muscle weakness (generalized): Secondary | ICD-10-CM | POA: Diagnosis not present

## 2022-11-15 DIAGNOSIS — M6281 Muscle weakness (generalized): Secondary | ICD-10-CM | POA: Diagnosis not present

## 2022-11-15 DIAGNOSIS — M5459 Other low back pain: Secondary | ICD-10-CM | POA: Diagnosis not present

## 2022-11-15 DIAGNOSIS — R2689 Other abnormalities of gait and mobility: Secondary | ICD-10-CM | POA: Diagnosis not present

## 2022-11-15 DIAGNOSIS — M25512 Pain in left shoulder: Secondary | ICD-10-CM | POA: Diagnosis not present

## 2022-11-17 DIAGNOSIS — R2689 Other abnormalities of gait and mobility: Secondary | ICD-10-CM | POA: Diagnosis not present

## 2022-11-17 DIAGNOSIS — M25512 Pain in left shoulder: Secondary | ICD-10-CM | POA: Diagnosis not present

## 2022-11-17 DIAGNOSIS — M6281 Muscle weakness (generalized): Secondary | ICD-10-CM | POA: Diagnosis not present

## 2022-11-17 DIAGNOSIS — M5459 Other low back pain: Secondary | ICD-10-CM | POA: Diagnosis not present

## 2022-11-19 DIAGNOSIS — M5459 Other low back pain: Secondary | ICD-10-CM | POA: Diagnosis not present

## 2022-11-19 DIAGNOSIS — R2689 Other abnormalities of gait and mobility: Secondary | ICD-10-CM | POA: Diagnosis not present

## 2022-11-19 DIAGNOSIS — M25512 Pain in left shoulder: Secondary | ICD-10-CM | POA: Diagnosis not present

## 2022-11-19 DIAGNOSIS — M6281 Muscle weakness (generalized): Secondary | ICD-10-CM | POA: Diagnosis not present

## 2022-11-22 DIAGNOSIS — M6281 Muscle weakness (generalized): Secondary | ICD-10-CM | POA: Diagnosis not present

## 2022-11-22 DIAGNOSIS — M5459 Other low back pain: Secondary | ICD-10-CM | POA: Diagnosis not present

## 2022-11-22 DIAGNOSIS — R2689 Other abnormalities of gait and mobility: Secondary | ICD-10-CM | POA: Diagnosis not present

## 2022-11-22 DIAGNOSIS — M25512 Pain in left shoulder: Secondary | ICD-10-CM | POA: Diagnosis not present

## 2022-11-24 DIAGNOSIS — M25512 Pain in left shoulder: Secondary | ICD-10-CM | POA: Diagnosis not present

## 2022-11-24 DIAGNOSIS — M6281 Muscle weakness (generalized): Secondary | ICD-10-CM | POA: Diagnosis not present

## 2022-11-24 DIAGNOSIS — M5459 Other low back pain: Secondary | ICD-10-CM | POA: Diagnosis not present

## 2022-11-24 DIAGNOSIS — R2689 Other abnormalities of gait and mobility: Secondary | ICD-10-CM | POA: Diagnosis not present

## 2022-11-26 DIAGNOSIS — M5459 Other low back pain: Secondary | ICD-10-CM | POA: Diagnosis not present

## 2022-11-26 DIAGNOSIS — R2689 Other abnormalities of gait and mobility: Secondary | ICD-10-CM | POA: Diagnosis not present

## 2022-11-26 DIAGNOSIS — M25512 Pain in left shoulder: Secondary | ICD-10-CM | POA: Diagnosis not present

## 2022-11-26 DIAGNOSIS — M6281 Muscle weakness (generalized): Secondary | ICD-10-CM | POA: Diagnosis not present

## 2022-11-29 ENCOUNTER — Ambulatory Visit
Admission: RE | Admit: 2022-11-29 | Discharge: 2022-11-29 | Disposition: A | Discharge status: Home / Self Care | Payer: Medicare Other | Source: Ambulatory Visit | Attending: Family Medicine | Admitting: Family Medicine

## 2022-11-29 DIAGNOSIS — R2689 Other abnormalities of gait and mobility: Secondary | ICD-10-CM | POA: Diagnosis not present

## 2022-11-29 DIAGNOSIS — M25512 Pain in left shoulder: Secondary | ICD-10-CM | POA: Diagnosis not present

## 2022-11-29 DIAGNOSIS — M5459 Other low back pain: Secondary | ICD-10-CM | POA: Diagnosis not present

## 2022-11-29 DIAGNOSIS — M6281 Muscle weakness (generalized): Secondary | ICD-10-CM | POA: Diagnosis not present

## 2022-11-29 DIAGNOSIS — Z1231 Encounter for screening mammogram for malignant neoplasm of breast: Secondary | ICD-10-CM | POA: Diagnosis not present

## 2022-11-30 DIAGNOSIS — M5459 Other low back pain: Secondary | ICD-10-CM | POA: Diagnosis not present

## 2022-11-30 DIAGNOSIS — M25512 Pain in left shoulder: Secondary | ICD-10-CM | POA: Diagnosis not present

## 2022-11-30 DIAGNOSIS — R2689 Other abnormalities of gait and mobility: Secondary | ICD-10-CM | POA: Diagnosis not present

## 2022-11-30 DIAGNOSIS — M6281 Muscle weakness (generalized): Secondary | ICD-10-CM | POA: Diagnosis not present

## 2022-12-01 DIAGNOSIS — M25512 Pain in left shoulder: Secondary | ICD-10-CM | POA: Diagnosis not present

## 2022-12-01 DIAGNOSIS — M5459 Other low back pain: Secondary | ICD-10-CM | POA: Diagnosis not present

## 2022-12-01 DIAGNOSIS — M6281 Muscle weakness (generalized): Secondary | ICD-10-CM | POA: Diagnosis not present

## 2022-12-01 DIAGNOSIS — R2689 Other abnormalities of gait and mobility: Secondary | ICD-10-CM | POA: Diagnosis not present

## 2022-12-03 DIAGNOSIS — M25512 Pain in left shoulder: Secondary | ICD-10-CM | POA: Diagnosis not present

## 2022-12-03 DIAGNOSIS — M5459 Other low back pain: Secondary | ICD-10-CM | POA: Diagnosis not present

## 2022-12-03 DIAGNOSIS — R2689 Other abnormalities of gait and mobility: Secondary | ICD-10-CM | POA: Diagnosis not present

## 2022-12-03 DIAGNOSIS — M6281 Muscle weakness (generalized): Secondary | ICD-10-CM | POA: Diagnosis not present

## 2022-12-06 DIAGNOSIS — M5459 Other low back pain: Secondary | ICD-10-CM | POA: Diagnosis not present

## 2022-12-06 DIAGNOSIS — M25512 Pain in left shoulder: Secondary | ICD-10-CM | POA: Diagnosis not present

## 2022-12-06 DIAGNOSIS — R2689 Other abnormalities of gait and mobility: Secondary | ICD-10-CM | POA: Diagnosis not present

## 2022-12-06 DIAGNOSIS — M6281 Muscle weakness (generalized): Secondary | ICD-10-CM | POA: Diagnosis not present

## 2022-12-07 DIAGNOSIS — M6281 Muscle weakness (generalized): Secondary | ICD-10-CM | POA: Diagnosis not present

## 2022-12-07 DIAGNOSIS — M25512 Pain in left shoulder: Secondary | ICD-10-CM | POA: Diagnosis not present

## 2022-12-07 DIAGNOSIS — M5459 Other low back pain: Secondary | ICD-10-CM | POA: Diagnosis not present

## 2022-12-07 DIAGNOSIS — R2689 Other abnormalities of gait and mobility: Secondary | ICD-10-CM | POA: Diagnosis not present

## 2022-12-08 DIAGNOSIS — R2689 Other abnormalities of gait and mobility: Secondary | ICD-10-CM | POA: Diagnosis not present

## 2022-12-08 DIAGNOSIS — M25512 Pain in left shoulder: Secondary | ICD-10-CM | POA: Diagnosis not present

## 2022-12-08 DIAGNOSIS — M6281 Muscle weakness (generalized): Secondary | ICD-10-CM | POA: Diagnosis not present

## 2022-12-08 DIAGNOSIS — M5459 Other low back pain: Secondary | ICD-10-CM | POA: Diagnosis not present

## 2022-12-09 DIAGNOSIS — R2689 Other abnormalities of gait and mobility: Secondary | ICD-10-CM | POA: Diagnosis not present

## 2022-12-09 DIAGNOSIS — M5459 Other low back pain: Secondary | ICD-10-CM | POA: Diagnosis not present

## 2022-12-09 DIAGNOSIS — M25512 Pain in left shoulder: Secondary | ICD-10-CM | POA: Diagnosis not present

## 2022-12-09 DIAGNOSIS — M6281 Muscle weakness (generalized): Secondary | ICD-10-CM | POA: Diagnosis not present

## 2022-12-10 DIAGNOSIS — N200 Calculus of kidney: Secondary | ICD-10-CM | POA: Diagnosis not present

## 2022-12-10 DIAGNOSIS — M5459 Other low back pain: Secondary | ICD-10-CM | POA: Diagnosis not present

## 2022-12-10 DIAGNOSIS — R2689 Other abnormalities of gait and mobility: Secondary | ICD-10-CM | POA: Diagnosis not present

## 2022-12-10 DIAGNOSIS — M6281 Muscle weakness (generalized): Secondary | ICD-10-CM | POA: Diagnosis not present

## 2022-12-10 DIAGNOSIS — M25512 Pain in left shoulder: Secondary | ICD-10-CM | POA: Diagnosis not present

## 2022-12-13 DIAGNOSIS — R2689 Other abnormalities of gait and mobility: Secondary | ICD-10-CM | POA: Diagnosis not present

## 2022-12-13 DIAGNOSIS — M6281 Muscle weakness (generalized): Secondary | ICD-10-CM | POA: Diagnosis not present

## 2022-12-13 DIAGNOSIS — M25512 Pain in left shoulder: Secondary | ICD-10-CM | POA: Diagnosis not present

## 2022-12-13 DIAGNOSIS — M5459 Other low back pain: Secondary | ICD-10-CM | POA: Diagnosis not present

## 2022-12-15 DIAGNOSIS — R2689 Other abnormalities of gait and mobility: Secondary | ICD-10-CM | POA: Diagnosis not present

## 2022-12-15 DIAGNOSIS — M25512 Pain in left shoulder: Secondary | ICD-10-CM | POA: Diagnosis not present

## 2022-12-15 DIAGNOSIS — M5459 Other low back pain: Secondary | ICD-10-CM | POA: Diagnosis not present

## 2022-12-15 DIAGNOSIS — M6281 Muscle weakness (generalized): Secondary | ICD-10-CM | POA: Diagnosis not present

## 2022-12-17 DIAGNOSIS — R2689 Other abnormalities of gait and mobility: Secondary | ICD-10-CM | POA: Diagnosis not present

## 2022-12-17 DIAGNOSIS — M5459 Other low back pain: Secondary | ICD-10-CM | POA: Diagnosis not present

## 2022-12-17 DIAGNOSIS — M25512 Pain in left shoulder: Secondary | ICD-10-CM | POA: Diagnosis not present

## 2022-12-17 DIAGNOSIS — M6281 Muscle weakness (generalized): Secondary | ICD-10-CM | POA: Diagnosis not present

## 2022-12-20 DIAGNOSIS — M6281 Muscle weakness (generalized): Secondary | ICD-10-CM | POA: Diagnosis not present

## 2022-12-20 DIAGNOSIS — R2689 Other abnormalities of gait and mobility: Secondary | ICD-10-CM | POA: Diagnosis not present

## 2022-12-20 DIAGNOSIS — M25512 Pain in left shoulder: Secondary | ICD-10-CM | POA: Diagnosis not present

## 2022-12-20 DIAGNOSIS — M5459 Other low back pain: Secondary | ICD-10-CM | POA: Diagnosis not present

## 2022-12-22 DIAGNOSIS — M5459 Other low back pain: Secondary | ICD-10-CM | POA: Diagnosis not present

## 2022-12-22 DIAGNOSIS — M6281 Muscle weakness (generalized): Secondary | ICD-10-CM | POA: Diagnosis not present

## 2022-12-22 DIAGNOSIS — R2689 Other abnormalities of gait and mobility: Secondary | ICD-10-CM | POA: Diagnosis not present

## 2022-12-22 DIAGNOSIS — M25512 Pain in left shoulder: Secondary | ICD-10-CM | POA: Diagnosis not present

## 2022-12-24 DIAGNOSIS — M25512 Pain in left shoulder: Secondary | ICD-10-CM | POA: Diagnosis not present

## 2022-12-24 DIAGNOSIS — M5459 Other low back pain: Secondary | ICD-10-CM | POA: Diagnosis not present

## 2022-12-24 DIAGNOSIS — R2689 Other abnormalities of gait and mobility: Secondary | ICD-10-CM | POA: Diagnosis not present

## 2022-12-24 DIAGNOSIS — M6281 Muscle weakness (generalized): Secondary | ICD-10-CM | POA: Diagnosis not present

## 2022-12-27 DIAGNOSIS — M25512 Pain in left shoulder: Secondary | ICD-10-CM | POA: Diagnosis not present

## 2022-12-27 DIAGNOSIS — M5459 Other low back pain: Secondary | ICD-10-CM | POA: Diagnosis not present

## 2022-12-27 DIAGNOSIS — M6281 Muscle weakness (generalized): Secondary | ICD-10-CM | POA: Diagnosis not present

## 2022-12-27 DIAGNOSIS — R2689 Other abnormalities of gait and mobility: Secondary | ICD-10-CM | POA: Diagnosis not present

## 2022-12-28 DIAGNOSIS — M6281 Muscle weakness (generalized): Secondary | ICD-10-CM | POA: Diagnosis not present

## 2022-12-28 DIAGNOSIS — M5459 Other low back pain: Secondary | ICD-10-CM | POA: Diagnosis not present

## 2022-12-28 DIAGNOSIS — R2689 Other abnormalities of gait and mobility: Secondary | ICD-10-CM | POA: Diagnosis not present

## 2022-12-28 DIAGNOSIS — M25512 Pain in left shoulder: Secondary | ICD-10-CM | POA: Diagnosis not present

## 2022-12-30 DIAGNOSIS — M5459 Other low back pain: Secondary | ICD-10-CM | POA: Diagnosis not present

## 2022-12-30 DIAGNOSIS — M25512 Pain in left shoulder: Secondary | ICD-10-CM | POA: Diagnosis not present

## 2022-12-30 DIAGNOSIS — R2689 Other abnormalities of gait and mobility: Secondary | ICD-10-CM | POA: Diagnosis not present

## 2022-12-30 DIAGNOSIS — M6281 Muscle weakness (generalized): Secondary | ICD-10-CM | POA: Diagnosis not present

## 2022-12-31 ENCOUNTER — Encounter: Payer: Self-pay | Admitting: Sports Medicine

## 2022-12-31 ENCOUNTER — Non-Acute Institutional Stay: Payer: Medicare Other | Admitting: Sports Medicine

## 2022-12-31 DIAGNOSIS — N1832 Chronic kidney disease, stage 3b: Secondary | ICD-10-CM | POA: Diagnosis not present

## 2022-12-31 DIAGNOSIS — K219 Gastro-esophageal reflux disease without esophagitis: Secondary | ICD-10-CM | POA: Diagnosis not present

## 2022-12-31 DIAGNOSIS — G8191 Hemiplegia, unspecified affecting right dominant side: Secondary | ICD-10-CM | POA: Diagnosis not present

## 2022-12-31 DIAGNOSIS — Z01419 Encounter for gynecological examination (general) (routine) without abnormal findings: Secondary | ICD-10-CM | POA: Diagnosis not present

## 2022-12-31 DIAGNOSIS — Z124 Encounter for screening for malignant neoplasm of cervix: Secondary | ICD-10-CM | POA: Diagnosis not present

## 2022-12-31 DIAGNOSIS — G40909 Epilepsy, unspecified, not intractable, without status epilepticus: Secondary | ICD-10-CM

## 2022-12-31 DIAGNOSIS — D649 Anemia, unspecified: Secondary | ICD-10-CM

## 2022-12-31 DIAGNOSIS — R269 Unspecified abnormalities of gait and mobility: Secondary | ICD-10-CM | POA: Diagnosis not present

## 2022-12-31 DIAGNOSIS — I639 Cerebral infarction, unspecified: Secondary | ICD-10-CM | POA: Diagnosis not present

## 2022-12-31 DIAGNOSIS — N951 Menopausal and female climacteric states: Secondary | ICD-10-CM | POA: Diagnosis not present

## 2022-12-31 NOTE — Progress Notes (Signed)
Provider:   Location:  [facility] Room Number: 81A Place of Service:  ALF (13)  PCP: Mast, Man X, NP Patient Care Team: Mast, Man X, NP as PCP - General (Internal Medicine) Christell Constant, MD as PCP - Cardiology (Cardiology) Van Clines, MD as Consulting Physician (Neurology) Annie Sable, MD as Consulting Physician (Nephrology) Heloise Purpura, MD as Consulting Physician (Urology)  Extended Emergency Contact Information Primary Emergency Contact: Debbrah Alar [place] Home Phone: [phone] Mobile Phone: [phone] Relation: Relative Secondary Emergency Contact: Roper,Paula Address: [address]          [place], [place] [address] [place] Home Phone: [phone] Mobile Phone: [phone] Relation: Sister  Code Status:  Goals of Care: Advanced Directive information    12/31/2022    1:27 PM  Advanced Directives  Does Patient Have a Medical Advance Directive? Yes  Type of Advance Directive Out of facility DNR (pink MOST or yellow form);Healthcare Power of Attorney  Does patient want to make changes to medical advance directive? No - Patient declined  Copy of Healthcare Power of Attorney in Chart? Yes - validated most recent copy scanned in chart (See row information)      Chief Complaint  Patient presents with   Medical Management of Chronic Issues    Patient is being seen for a routine visit    Immunizations    Patient is due for shingles, flu vaccine    Health Maintenance    Patient is due for a pap smear     HPI: Patient is a 67 y.o. 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 Follows with neurology  Denies dysphagia   H/o Pulmonary nodule - CT 10/2022 , stable  Denies cough, sob   Seizures- On  topomax No new seizures  H/o CAD - follows with cardiology Denies chest pain, sob, palpitations  ALP elevation  Denies abdominal pain, nausea, vomiting   Anemia Denies hematuria, bloody or dark stools   Past Medical History:  Diagnosis Date   Acute kidney injury (HCC) 09/03/2019   05-2019 kidney stone removed   Allergy    seasonal allergies   Arthritis    LEFT hand   AVM (arteriovenous malformation) brain    Cataract    bilateral-not a surgical candidate at this time (07/23/2020)   COVID-19 virus infection 08/2020   GERD (gastroesophageal reflux disease)    on meds   Hyperlipidemia    diet controlled   Insulin resistance 02/16/2018   Seizures (HCC)    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 Berdine Dance   BRONCHIAL BIOPSY  04/21/2021   Procedure: BRONCHIAL BIOPSIES;  Surgeon: Josephine Igo, DO;  Location: [facility];  Service: Pulmonary;;   BRONCHIAL BRUSHINGS  04/21/2021   Procedure: BRONCHIAL BRUSHINGS;  Surgeon: Josephine Igo, DO;  Location: [facility];  Service: Pulmonary;;   BRONCHIAL WASHINGS  04/21/2021   Procedure: BRONCHIAL WASHINGS;  Surgeon: Josephine Igo, DO;  Location: [facility];  Service: Pulmonary;;   COLONOSCOPY  2017   [place], Bud   CYSTOSCOPY/URETEROSCOPY/HOLMIUM LASER/STENT PLACEMENT Left 05/10/2019   Procedure: CYSTOSCOPY/RETROGRADE/URETEROSCOPY/HOLMIUM LASER/STENT PLACEMENT;  Surgeon: Heloise Purpura, MD;  Location: [facility];  Service: Urology;  Laterality: Left;   FIDUCIAL MARKER PLACEMENT  04/21/2021   Procedure: FIDUCIAL MARKER PLACEMENT;  Surgeon: Josephine Igo, DO;  Location: [facility];  Service: Pulmonary;;   FOOT SURGERY Right 10/1985   ankle fusion, at [facility], Dr.  Nyra Capes   INTRAMEDULLARY (IM) NAIL INTERTROCHANTERIC Right 07/09/2021   Procedure: INTRAMEDULLARY (IM) NAIL INTERTROCHANTRIC;  Surgeon: Samson Frederic, MD;  Location: [facility];  Service: Orthopedics;  Laterality: Right;   POLYPECTOMY  2017   multiple adenomas   TUBAL LIGATION  1981   VIDEO BRONCHOSCOPY WITH RADIAL ENDOBRONCHIAL ULTRASOUND  04/21/2021   Procedure: RADIAL ENDOBRONCHIAL ULTRASOUND;  Surgeon: Josephine Igo, DO;  Location: [facility];  Service: Pulmonary;;   WISDOM TOOTH EXTRACTION      reports that she has never smoked. She has been exposed to tobacco smoke. She has never used smokeless tobacco. She reports that she does not drink alcohol and does not use drugs. Social History   Socioeconomic History   Marital status: Single    Spouse name: Not on file   Number of children: Not on file   Years of education: Not on file   Highest education level: Not on file  Occupational History   Not on file  Tobacco Use   Smoking status: Never    Passive exposure: Past   Smokeless tobacco: Never  Vaping Use   Vaping status: Never Used  Substance and Sexual Activity   Alcohol use: No   Drug use: No   Sexual activity: Not Currently    Birth control/protection: None    Comment: 1st intercourse 67 yo-Fewer than 5 partners  Other Topics Concern   Not on file  Social History Narrative   Right handed until 3rd grade    Left handed   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    Social Determinants of Health   Financial Resource Strain: Not on file  Food Insecurity: Not on file  Transportation Needs: Not on file  Physical Activity: Inactive (08/22/2018)   Exercise Vital Sign    Days of Exercise per Week: 0 days    Minutes of Exercise per Session: 0 min  Stress: No Stress Concern Present (08/22/2018)   [employer] of Occupational Health - Occupational Stress Questionnaire    Feeling of Stress : Only a little  Social Connections: Not on file  Intimate Partner Violence: Not on  file    Functional Status Survey:    Family History  Problem Relation Age of Onset   Heart disease Mother    Heart failure Mother    Heart attack Mother    Diabetes Father    Hyperlipidemia Father    Dementia Father    Prostate cancer Father    Melanoma Father    Colon polyps Father    Hyperlipidemia Paternal Uncle    Hypertension Paternal Uncle    Dementia Paternal Uncle    Heart attack Maternal Grandfather    Stroke Paternal Grandmother    CAD Maternal Uncle    COPD Maternal Uncle        smoker   Breast cancer Neg Hx    Colon cancer Neg Hx    Esophageal cancer Neg Hx    Stomach cancer Neg Hx    Rectal cancer Neg  Hx     Health Maintenance  Topic Date Due   Zoster Vaccines- Shingrix (1 of 2) 01/30/1977   PAP SMEAR-Modifier  12/06/2020   INFLUENZA VACCINE  12/02/2022   COVID-19 Vaccine (8 - 2023-24 season) 05/09/2023 (Originally 04/29/2022)   Medicare Annual Wellness (AWV)  08/18/2023   Colonoscopy  01/09/2024   MAMMOGRAM  11/28/2024   DTaP/Tdap/Td (4 - Td or Tdap) 10/18/2029   HPV VACCINES  Aged Out   Hepatitis C Screening  Discontinued   HIV Screening  Discontinued    Allergies  Allergen Reactions   Aspirin Other (See Comments)    CONTRAINDICATED DUE TO HISTORY OF BLEEDING IN BRAIN   Cheese     Cant take due to history of severe migraines   Chocolate     Cant take due to history of severe migraines   Coffee Flavor     Cant take due to history of severe migraines   Other      Nuts - Cant take due to history of severe migraines   Red Wine Complex [Germanium]     Cant take due to history of severe migraines    Outpatient Encounter Medications as of 12/31/2022  Medication Sig   acetaminophen (TYLENOL) 325 MG tablet Take 650 mg by mouth every 4 (four) hours as needed for headache or mild pain.   baclofen (LIORESAL) 10 MG tablet Take 10 mg by mouth at bedtime.   Calcium Carbonate 500 MG CHEW Chew 500 mg by mouth in the morning and at bedtime.    fexofenadine (ALLEGRA) 180 MG tablet Take 180 mg by mouth daily.   guaiFENesin (MUCINEX) 600 MG 12 hr tablet Take 600 mg by mouth 2 (two) times daily as needed for cough.   Multiple Vitamins-Minerals (THEREMS-M) TABS Take 1 tablet by mouth daily.   nystatin (MYCOSTATIN/NYSTOP) powder Apply 1 application. topically 2 (two) times daily as needed (rash under breast).   Omega-3 Fatty Acids (OMEGA-3 FISH OIL PO) Take 1 capsule by mouth daily.   omeprazole (PRILOSEC) 20 MG capsule Take 20 mg by mouth daily.   polyethylene glycol (MIRALAX / GLYCOLAX) 17 g packet Take 17 g by mouth daily as needed for mild constipation.   Propylene Glycol (SYSTANE BALANCE) 0.6 % SOLN Place 1 drop into both eyes 2 (two) times daily.   rosuvastatin (CRESTOR) 10 MG tablet Take 1 tablet (10 mg total) by mouth daily.   tiZANidine (ZANAFLEX) 2 MG tablet Take 1 tablet as needed once a day for headache (Give Tizanidine instead of Tylenol)   topiramate (TOPAMAX) 100 MG tablet Take 300 mg by mouth 2 (two) times daily.   Vitamin D3 (VITAMIN D) 25 MCG tablet Take 1,000 Units by mouth daily.   zinc oxide 20 % ointment Apply 1 application. topically as needed (after every incontinent episode).   triamcinolone cream (KENALOG) 0.5 % Apply 1 Application topically 3 (three) times daily. Apply to UNDER BREAST topically two times a day for RASH for 14 Days (Patient not taking: Reported on 12/31/2022)   No facility-administered encounter medications on file as of 12/31/2022.    Review of Systems  Constitutional:  Negative for chills.  Respiratory:  Negative for cough and shortness of breath.   Cardiovascular:  Negative for chest pain and leg swelling.  Gastrointestinal:  Negative for abdominal distention and nausea.  Genitourinary:  Negative for dysuria and hematuria.  Neurological:  Negative for dizziness.    Vitals:   12/31/22 1325  BP: (!) 140/82  Pulse: Marland Kitchen)  59  Resp: 18  Temp: (!) 97.2 F (36.2 C)  TempSrc: Temporal  SpO2:  94%  Weight: 163 lb 12.8 oz (74.3 kg)  Height: 5\' 6"  (1.676 m)   Body mass index is 26.44 kg/m. Physical Exam Constitutional:      Appearance: Normal appearance.  Cardiovascular:     Rate and Rhythm: Normal rate and regular rhythm.     Pulses: Normal pulses.     Heart sounds: Normal heart sounds.  Pulmonary:     Effort: Pulmonary effort is normal. No respiratory distress.     Breath sounds: No stridor. No wheezing or rales.  Abdominal:     Palpations: There is no mass.     Tenderness: There is no abdominal tenderness.  Neurological:     Mental Status: She is alert. Mental status is at baseline.     Comments: Residual weakness on rt side      Labs reviewed: Basic Metabolic Panel: No results for input(s): "NA", "K", "CL", "CO2", "GLUCOSE", "BUN", "CREATININE", "CALCIUM", "MG", "PHOS" in the last 8760 hours. Liver Function Tests: Recent Labs    07/01/22 1104  AST 16  ALT 17  ALKPHOS 173*  BILITOT <0.2  PROT 6.7  ALBUMIN 4.0   No results for input(s): "LIPASE", "AMYLASE" in the last 8760 hours. No results for input(s): "AMMONIA" in the last 8760 hours. CBC: No results for input(s): "WBC", "NEUTROABS", "HGB", "HCT", "MCV", "PLT" in the last 8760 hours. Cardiac Enzymes: No results for input(s): "CKTOTAL", "CKMB", "CKMBINDEX", "TROPONINI" in the last 8760 hours. BNP: Invalid input(s): "POCBNP" Lab Results  Component Value Date   HGBA1C 4.8 12/19/2020   Lab Results  Component Value Date   TSH 3.039 07/15/2021   Lab Results  Component Value Date   VITAMINB12 391 08/07/2021   Lab Results  Component Value Date   FOLATE 45.2 07/09/2021   Lab Results  Component Value Date   IRON 93 08/07/2021   TIBC 219 08/07/2021   FERRITIN 170 08/07/2021    Imaging and Procedures obtained prior to SNF admission: MM 3D SCREENING MAMMOGRAM BILATERAL BREAST  Result Date: 12/01/2022 CLINICAL DATA:  Screening. EXAM: DIGITAL SCREENING BILATERAL MAMMOGRAM WITH TOMOSYNTHESIS AND  CAD TECHNIQUE: Bilateral screening digital craniocaudal and mediolateral oblique mammograms were obtained. Bilateral screening digital breast tomosynthesis was performed. The images were evaluated with computer-aided detection. COMPARISON:  Previous exam(s). ACR Breast Density Category b: There are scattered areas of fibroglandular density. FINDINGS: There are no findings suspicious for malignancy. IMPRESSION: No mammographic evidence of malignancy. A result letter of this screening mammogram will be mailed directly to the patient. RECOMMENDATION: Screening mammogram in one year. (Code:SM-B-01Y) BI-RADS CATEGORY  1: Negative. Electronically Signed   By: Sande Brothers M.D.   On: 12/01/2022 08:51    Assessment/Plan  1. Right hemiparesis (HCC) H/o CVA with residual weakness Cont with baclofen , crestor   2. Chronic kidney disease, stage 3b (HCC) Will check bmp Avoid nephrotoxic meds  3. Seizure disorder (HCC) Stable  Cont with topomax   4. Gait abnormality Instructed to walker all the time Pt completed PT recently  Instructed to do exercises daily  5. Gastroesophageal reflux disease, unspecified whether esophagitis present Cont with omeprazole Avoid spicy foods , nsaids  6. Anemia, unspecified type No signs of bleeding Will check cbc  Dr Jacquenette Shone Medical director  [facility]

## 2023-01-04 ENCOUNTER — Encounter: Payer: Self-pay | Admitting: Nurse Practitioner

## 2023-01-04 DIAGNOSIS — Z1321 Encounter for screening for nutritional disorder: Secondary | ICD-10-CM | POA: Diagnosis not present

## 2023-01-04 DIAGNOSIS — I10 Essential (primary) hypertension: Secondary | ICD-10-CM | POA: Diagnosis not present

## 2023-01-04 DIAGNOSIS — N951 Menopausal and female climacteric states: Secondary | ICD-10-CM | POA: Insufficient documentation

## 2023-01-04 DIAGNOSIS — Z131 Encounter for screening for diabetes mellitus: Secondary | ICD-10-CM | POA: Diagnosis not present

## 2023-01-04 DIAGNOSIS — N1832 Chronic kidney disease, stage 3b: Secondary | ICD-10-CM | POA: Diagnosis not present

## 2023-01-04 DIAGNOSIS — E559 Vitamin D deficiency, unspecified: Secondary | ICD-10-CM | POA: Diagnosis not present

## 2023-01-07 LAB — LAB REPORT - SCANNED
A1c: 5.6
EGFR: 54

## 2023-01-19 ENCOUNTER — Ambulatory Visit: Payer: Medicare Other | Admitting: Internal Medicine

## 2023-01-20 DIAGNOSIS — E782 Mixed hyperlipidemia: Secondary | ICD-10-CM | POA: Diagnosis not present

## 2023-01-20 DIAGNOSIS — N1832 Chronic kidney disease, stage 3b: Secondary | ICD-10-CM | POA: Diagnosis not present

## 2023-01-24 ENCOUNTER — Ambulatory Visit: Payer: Medicare Other | Attending: Internal Medicine | Admitting: Internal Medicine

## 2023-01-24 ENCOUNTER — Encounter: Payer: Self-pay | Admitting: Internal Medicine

## 2023-01-24 VITALS — BP 132/90 | HR 84 | Ht 66.0 in | Wt 172.0 lb

## 2023-01-24 DIAGNOSIS — R0989 Other specified symptoms and signs involving the circulatory and respiratory systems: Secondary | ICD-10-CM

## 2023-01-24 DIAGNOSIS — I2584 Coronary atherosclerosis due to calcified coronary lesion: Secondary | ICD-10-CM | POA: Diagnosis not present

## 2023-01-24 DIAGNOSIS — I2510 Atherosclerotic heart disease of native coronary artery without angina pectoris: Secondary | ICD-10-CM

## 2023-01-24 DIAGNOSIS — E782 Mixed hyperlipidemia: Secondary | ICD-10-CM

## 2023-01-24 NOTE — Progress Notes (Signed)
disease in her mother; Heart failure in her mother; Hyperlipidemia in her father and paternal uncle; Hypertension in her paternal uncle; Melanoma in her father; Prostate cancer in her father; Stroke in her paternal grandmother. There is no history of Breast cancer, Colon cancer, Esophageal cancer, Stomach cancer, or Rectal cancer.  ROS:   Please see the history of present illness.      EKGs/Labs/Other Studies Reviewed:    The following studies were reviewed today:  Cardiac Studies & Procedures       ECHOCARDIOGRAM  ECHOCARDIOGRAM COMPLETE 07/09/2021  Narrative ECHOCARDIOGRAM REPORT    Patient Name:   Tonya Thompson Date of Exam: 07/09/2021 Medical Rec #:  [id number]        Height:       65.5 in Accession #:    [id number]       Weight:       144.0 lb Date of Birth:  December 11, 1955        BSA:          1.730 m Patient Age:    67 years         BP:           117/77 mmHg Patient Gender: F                HR:           108 bpm. Exam Location:  Inpatient  Procedure: 2D Echo, Color Doppler and Cardiac Doppler  Indications:    Preop  History:        Patient has no prior history of Echocardiogram examinations.  Sonographer:    Cleatis Polka Referring Phys: [id number] RAMESH KC  IMPRESSIONS   1. Left  ventricular ejection fraction, by estimation, is 55 to 60%. The left ventricle has normal function. Left ventricular endocardial border not optimally defined to evaluate regional wall motion. There is mild concentric left ventricular hypertrophy. Left ventricular diastolic parameters are consistent with Grade I diastolic dysfunction (impaired relaxation). 2. Right ventricular systolic function was not well visualized. The right ventricular size is normal. There is normal pulmonary artery systolic pressure. 3. The mitral valve is grossly normal. No evidence of mitral valve regurgitation. 4. The aortic valve is tricuspid. Aortic valve regurgitation is not visualized. No aortic stenosis is present. 5. The inferior vena cava is normal in size with greater than 50% respiratory variability, suggesting right atrial pressure of 3 mmHg.  Comparison(s): No prior Echocardiogram.  Conclusion(s)/Recommendation(s): In future studies would perform with echo contrast.  FINDINGS Left Ventricle: Left ventricular ejection fraction, by estimation, is 55 to 60%. The left ventricle has normal function. Left ventricular endocardial border not optimally defined to evaluate regional wall motion. The left ventricular internal cavity size was small. There is mild concentric left ventricular hypertrophy. Left ventricular diastolic parameters are consistent with Grade I diastolic dysfunction (impaired relaxation).  Right Ventricle: The right ventricular size is normal. Right vetricular wall thickness was not well visualized. Right ventricular systolic function was not well visualized. There is normal pulmonary artery systolic pressure. The tricuspid regurgitant velocity is 2.52 m/s, and with an assumed right atrial pressure of 3 mmHg, the estimated right ventricular systolic pressure is 28.4 mmHg.  Left Atrium: Left atrial size was normal in size.  Right Atrium: Right atrial size was normal in size.  Pericardium: Trivial  pericardial effusion is present.  Mitral Valve: The mitral valve is grossly normal. No evidence of mitral valve regurgitation.  Tricuspid Valve: The tricuspid valve is normal in structure.  disease in her mother; Heart failure in her mother; Hyperlipidemia in her father and paternal uncle; Hypertension in her paternal uncle; Melanoma in her father; Prostate cancer in her father; Stroke in her paternal grandmother. There is no history of Breast cancer, Colon cancer, Esophageal cancer, Stomach cancer, or Rectal cancer.  ROS:   Please see the history of present illness.      EKGs/Labs/Other Studies Reviewed:    The following studies were reviewed today:  Cardiac Studies & Procedures       ECHOCARDIOGRAM  ECHOCARDIOGRAM COMPLETE 07/09/2021  Narrative ECHOCARDIOGRAM REPORT    Patient Name:   Tonya Thompson Date of Exam: 07/09/2021 Medical Rec #:  [id number]        Height:       65.5 in Accession #:    [id number]       Weight:       144.0 lb Date of Birth:  December 11, 1955        BSA:          1.730 m Patient Age:    67 years         BP:           117/77 mmHg Patient Gender: F                HR:           108 bpm. Exam Location:  Inpatient  Procedure: 2D Echo, Color Doppler and Cardiac Doppler  Indications:    Preop  History:        Patient has no prior history of Echocardiogram examinations.  Sonographer:    Cleatis Polka Referring Phys: [id number] RAMESH KC  IMPRESSIONS   1. Left  ventricular ejection fraction, by estimation, is 55 to 60%. The left ventricle has normal function. Left ventricular endocardial border not optimally defined to evaluate regional wall motion. There is mild concentric left ventricular hypertrophy. Left ventricular diastolic parameters are consistent with Grade I diastolic dysfunction (impaired relaxation). 2. Right ventricular systolic function was not well visualized. The right ventricular size is normal. There is normal pulmonary artery systolic pressure. 3. The mitral valve is grossly normal. No evidence of mitral valve regurgitation. 4. The aortic valve is tricuspid. Aortic valve regurgitation is not visualized. No aortic stenosis is present. 5. The inferior vena cava is normal in size with greater than 50% respiratory variability, suggesting right atrial pressure of 3 mmHg.  Comparison(s): No prior Echocardiogram.  Conclusion(s)/Recommendation(s): In future studies would perform with echo contrast.  FINDINGS Left Ventricle: Left ventricular ejection fraction, by estimation, is 55 to 60%. The left ventricle has normal function. Left ventricular endocardial border not optimally defined to evaluate regional wall motion. The left ventricular internal cavity size was small. There is mild concentric left ventricular hypertrophy. Left ventricular diastolic parameters are consistent with Grade I diastolic dysfunction (impaired relaxation).  Right Ventricle: The right ventricular size is normal. Right vetricular wall thickness was not well visualized. Right ventricular systolic function was not well visualized. There is normal pulmonary artery systolic pressure. The tricuspid regurgitant velocity is 2.52 m/s, and with an assumed right atrial pressure of 3 mmHg, the estimated right ventricular systolic pressure is 28.4 mmHg.  Left Atrium: Left atrial size was normal in size.  Right Atrium: Right atrial size was normal in size.  Pericardium: Trivial  pericardial effusion is present.  Mitral Valve: The mitral valve is grossly normal. No evidence of mitral valve regurgitation.  Tricuspid Valve: The tricuspid valve is normal in structure.  Cardiology Office Note:    Date:  01/24/2023   ID:  Sister Schlageter Doleman, DOB 1956/02/24, MRN [id number]  PCP:  Mast, Man X, NP   [facility] Providers Cardiologist:  Christell Constant, MD     Referring MD: Mast, Man X, NP   CC: Coronary artery calcifications  History of Present Illness:    Tonya Thompson is a 67 y.o. 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 HLD aortic atherosclerosis, CAC who presents for 06/17/21. 2023: Her father passed. 2024: Saw Wallis Bamberg NP.  Her rosuvastatin was briefly held for query of transaminitis.    Patient notes that she is doing well.   Since last visit notes that she is back on cholesterol medications with no issues . There are no interval hospital/ED visit.   She notes that she is playing cribbage and still in the [employer]; her knee brace is the biggest detriment to her QOL presently.  No chest pain or pressure .  No SOB/DOE and no PND/Orthopnea.  Slight leg swelling in the left leg.Marland Kitchen  No palpitations or syncope .   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 Berdine Dance   BRONCHIAL BIOPSY  04/21/2021    Procedure: BRONCHIAL BIOPSIES;  Surgeon: Josephine Igo, DO;  Location: [facility];  Service: Pulmonary;;   BRONCHIAL BRUSHINGS  04/21/2021   Procedure: BRONCHIAL BRUSHINGS;  Surgeon: Josephine Igo, DO;  Location: [facility];  Service: Pulmonary;;   BRONCHIAL WASHINGS  04/21/2021   Procedure: BRONCHIAL WASHINGS;  Surgeon: Josephine Igo, DO;  Location: [facility];  Service: Pulmonary;;   [place]ONOSCOPY  2017   [place]   CYSTOSCOPY/URETEROSCOPY/HOLMIUM LASER/STENT PLACEMENT Left 05/10/2019   Procedure: CYSTOSCOPY/RETROGRADE/URETEROSCOPY/HOLMIUM LASER/STENT PLACEMENT;  Surgeon: Heloise Purpura, MD;  Location: [facility];  Service: Urology;  Laterality: Left;   FIDUCIAL MARKER PLACEMENT  04/21/2021   Procedure: FIDUCIAL MARKER PLACEMENT;  Surgeon: Josephine Igo, DO;  Location: [facility];  Service: Pulmonary;;   FOOT SURGERY Right 10/1985   ankle fusion, at [facility], Dr. Nyra Capes   INTRAMEDULLARY (IM) NAIL INTERTROCHANTERIC Right 07/09/2021   Procedure: INTRAMEDULLARY (IM) NAIL INTERTROCHANTRIC;  Surgeon: Samson Frederic, MD;  Location: [facility];  Service: Orthopedics;  Laterality: Right;   POLYPECTOMY  2017   multiple adenomas   TUBAL LIGATION  1981   VIDEO BRONCHOSCOPY WITH RADIAL ENDOBRONCHIAL ULTRASOUND  04/21/2021   Procedure: RADIAL ENDOBRONCHIAL ULTRASOUND;  Surgeon: Josephine Igo, DO;  Location: [facility];  Service: Pulmonary;;   WISDOM TOOTH EXTRACTION      Current Medications:  disease in her mother; Heart failure in her mother; Hyperlipidemia in her father and paternal uncle; Hypertension in her paternal uncle; Melanoma in her father; Prostate cancer in her father; Stroke in her paternal grandmother. There is no history of Breast cancer, Colon cancer, Esophageal cancer, Stomach cancer, or Rectal cancer.  ROS:   Please see the history of present illness.      EKGs/Labs/Other Studies Reviewed:    The following studies were reviewed today:  Cardiac Studies & Procedures       ECHOCARDIOGRAM  ECHOCARDIOGRAM COMPLETE 07/09/2021  Narrative ECHOCARDIOGRAM REPORT    Patient Name:   Tonya Thompson Date of Exam: 07/09/2021 Medical Rec #:  [id number]        Height:       65.5 in Accession #:    [id number]       Weight:       144.0 lb Date of Birth:  December 11, 1955        BSA:          1.730 m Patient Age:    67 years         BP:           117/77 mmHg Patient Gender: F                HR:           108 bpm. Exam Location:  Inpatient  Procedure: 2D Echo, Color Doppler and Cardiac Doppler  Indications:    Preop  History:        Patient has no prior history of Echocardiogram examinations.  Sonographer:    Cleatis Polka Referring Phys: [id number] RAMESH KC  IMPRESSIONS   1. Left  ventricular ejection fraction, by estimation, is 55 to 60%. The left ventricle has normal function. Left ventricular endocardial border not optimally defined to evaluate regional wall motion. There is mild concentric left ventricular hypertrophy. Left ventricular diastolic parameters are consistent with Grade I diastolic dysfunction (impaired relaxation). 2. Right ventricular systolic function was not well visualized. The right ventricular size is normal. There is normal pulmonary artery systolic pressure. 3. The mitral valve is grossly normal. No evidence of mitral valve regurgitation. 4. The aortic valve is tricuspid. Aortic valve regurgitation is not visualized. No aortic stenosis is present. 5. The inferior vena cava is normal in size with greater than 50% respiratory variability, suggesting right atrial pressure of 3 mmHg.  Comparison(s): No prior Echocardiogram.  Conclusion(s)/Recommendation(s): In future studies would perform with echo contrast.  FINDINGS Left Ventricle: Left ventricular ejection fraction, by estimation, is 55 to 60%. The left ventricle has normal function. Left ventricular endocardial border not optimally defined to evaluate regional wall motion. The left ventricular internal cavity size was small. There is mild concentric left ventricular hypertrophy. Left ventricular diastolic parameters are consistent with Grade I diastolic dysfunction (impaired relaxation).  Right Ventricle: The right ventricular size is normal. Right vetricular wall thickness was not well visualized. Right ventricular systolic function was not well visualized. There is normal pulmonary artery systolic pressure. The tricuspid regurgitant velocity is 2.52 m/s, and with an assumed right atrial pressure of 3 mmHg, the estimated right ventricular systolic pressure is 28.4 mmHg.  Left Atrium: Left atrial size was normal in size.  Right Atrium: Right atrial size was normal in size.  Pericardium: Trivial  pericardial effusion is present.  Mitral Valve: The mitral valve is grossly normal. No evidence of mitral valve regurgitation.  Tricuspid Valve: The tricuspid valve is normal in structure.  disease in her mother; Heart failure in her mother; Hyperlipidemia in her father and paternal uncle; Hypertension in her paternal uncle; Melanoma in her father; Prostate cancer in her father; Stroke in her paternal grandmother. There is no history of Breast cancer, Colon cancer, Esophageal cancer, Stomach cancer, or Rectal cancer.  ROS:   Please see the history of present illness.      EKGs/Labs/Other Studies Reviewed:    The following studies were reviewed today:  Cardiac Studies & Procedures       ECHOCARDIOGRAM  ECHOCARDIOGRAM COMPLETE 07/09/2021  Narrative ECHOCARDIOGRAM REPORT    Patient Name:   Tonya Thompson Date of Exam: 07/09/2021 Medical Rec #:  [id number]        Height:       65.5 in Accession #:    [id number]       Weight:       144.0 lb Date of Birth:  December 11, 1955        BSA:          1.730 m Patient Age:    67 years         BP:           117/77 mmHg Patient Gender: F                HR:           108 bpm. Exam Location:  Inpatient  Procedure: 2D Echo, Color Doppler and Cardiac Doppler  Indications:    Preop  History:        Patient has no prior history of Echocardiogram examinations.  Sonographer:    Cleatis Polka Referring Phys: [id number] RAMESH KC  IMPRESSIONS   1. Left  ventricular ejection fraction, by estimation, is 55 to 60%. The left ventricle has normal function. Left ventricular endocardial border not optimally defined to evaluate regional wall motion. There is mild concentric left ventricular hypertrophy. Left ventricular diastolic parameters are consistent with Grade I diastolic dysfunction (impaired relaxation). 2. Right ventricular systolic function was not well visualized. The right ventricular size is normal. There is normal pulmonary artery systolic pressure. 3. The mitral valve is grossly normal. No evidence of mitral valve regurgitation. 4. The aortic valve is tricuspid. Aortic valve regurgitation is not visualized. No aortic stenosis is present. 5. The inferior vena cava is normal in size with greater than 50% respiratory variability, suggesting right atrial pressure of 3 mmHg.  Comparison(s): No prior Echocardiogram.  Conclusion(s)/Recommendation(s): In future studies would perform with echo contrast.  FINDINGS Left Ventricle: Left ventricular ejection fraction, by estimation, is 55 to 60%. The left ventricle has normal function. Left ventricular endocardial border not optimally defined to evaluate regional wall motion. The left ventricular internal cavity size was small. There is mild concentric left ventricular hypertrophy. Left ventricular diastolic parameters are consistent with Grade I diastolic dysfunction (impaired relaxation).  Right Ventricle: The right ventricular size is normal. Right vetricular wall thickness was not well visualized. Right ventricular systolic function was not well visualized. There is normal pulmonary artery systolic pressure. The tricuspid regurgitant velocity is 2.52 m/s, and with an assumed right atrial pressure of 3 mmHg, the estimated right ventricular systolic pressure is 28.4 mmHg.  Left Atrium: Left atrial size was normal in size.  Right Atrium: Right atrial size was normal in size.  Pericardium: Trivial  pericardial effusion is present.  Mitral Valve: The mitral valve is grossly normal. No evidence of mitral valve regurgitation.  Tricuspid Valve: The tricuspid valve is normal in structure.  Cardiology Office Note:    Date:  01/24/2023   ID:  Sister Schlageter Doleman, DOB 1956/02/24, MRN [id number]  PCP:  Mast, Man X, NP   [facility] Providers Cardiologist:  Christell Constant, MD     Referring MD: Mast, Man X, NP   CC: Coronary artery calcifications  History of Present Illness:    Tonya Thompson is a 67 y.o. 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 HLD aortic atherosclerosis, CAC who presents for 06/17/21. 2023: Her father passed. 2024: Saw Wallis Bamberg NP.  Her rosuvastatin was briefly held for query of transaminitis.    Patient notes that she is doing well.   Since last visit notes that she is back on cholesterol medications with no issues . There are no interval hospital/ED visit.   She notes that she is playing cribbage and still in the [employer]; her knee brace is the biggest detriment to her QOL presently.  No chest pain or pressure .  No SOB/DOE and no PND/Orthopnea.  Slight leg swelling in the left leg.Marland Kitchen  No palpitations or syncope .   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 Berdine Dance   BRONCHIAL BIOPSY  04/21/2021    Procedure: BRONCHIAL BIOPSIES;  Surgeon: Josephine Igo, DO;  Location: [facility];  Service: Pulmonary;;   BRONCHIAL BRUSHINGS  04/21/2021   Procedure: BRONCHIAL BRUSHINGS;  Surgeon: Josephine Igo, DO;  Location: [facility];  Service: Pulmonary;;   BRONCHIAL WASHINGS  04/21/2021   Procedure: BRONCHIAL WASHINGS;  Surgeon: Josephine Igo, DO;  Location: [facility];  Service: Pulmonary;;   [place]OSCOPY  2017   [place]   CYSTOSCOPY/URETEROSCOPY/HOLMIUM LASER/STENT PLACEMENT Left 05/10/2019   Procedure: CYSTOSCOPY/RETROGRADE/URETEROSCOPY/HOLMIUM LASER/STENT PLACEMENT;  Surgeon: Heloise Purpura, MD;  Location: [facility];  Service: Urology;  Laterality: Left;   FIDUCIAL MARKER PLACEMENT  04/21/2021   Procedure: FIDUCIAL MARKER PLACEMENT;  Surgeon: Josephine Igo, DO;  Location: [facility];  Service: Pulmonary;;   FOOT SURGERY Right 10/1985   ankle fusion, at [facility], Dr. Nyra Capes   INTRAMEDULLARY (IM) NAIL INTERTROCHANTERIC Right 07/09/2021   Procedure: INTRAMEDULLARY (IM) NAIL INTERTROCHANTRIC;  Surgeon: Samson Frederic, MD;  Location: [facility];  Service: Orthopedics;  Laterality: Right;   POLYPECTOMY  2017   multiple adenomas   TUBAL LIGATION  1981   VIDEO BRONCHOSCOPY WITH RADIAL ENDOBRONCHIAL ULTRASOUND  04/21/2021   Procedure: RADIAL ENDOBRONCHIAL ULTRASOUND;  Surgeon: Josephine Igo, DO;  Location: [facility];  Service: Pulmonary;;   WISDOM TOOTH EXTRACTION      Current Medications:

## 2023-01-24 NOTE — Patient Instructions (Signed)
Medication Instructions:  Your physician recommends that you continue on your current medications as directed. Please refer to the Current Medication list given to you today. Please continue to take rosuvastatin  *If you need a refill on your cardiac medications before your next appointment, please call your pharmacy*   Lab Work: NONE If you have labs (blood work) drawn today and your tests are completely normal, you will receive your results only by: MyChart Message (if you have MyChart) OR A paper copy in the mail If you have any lab test that is abnormal or we need to change your treatment, we will call you to review the results.   Testing/Procedures: NONE   Follow-Up: At [facility], you and your health needs are our priority.  As part of our continuing mission to provide you with exceptional heart care, we have created designated Provider Care Teams.  These Care Teams include your primary Cardiologist (physician) and Advanced Practice Providers (APPs -  Physician Assistants and Nurse Practitioners) who all work together to provide you with the care you need, when you need it.   Your next appointment:   1 year(s)  Provider:   Wallis Bamberg, NP     Then, Christell Constant, MD will plan to see you again in 2 year(s).

## 2023-01-28 ENCOUNTER — Telehealth: Payer: Self-pay | Admitting: Internal Medicine

## 2023-01-28 NOTE — Telephone Encounter (Signed)
Patient is returning RN's call for her lab results.  She is requesting a VM be left if she does not answer.   Please advise.

## 2023-01-28 NOTE — Telephone Encounter (Signed)
Called pt left a detailed message of MD review of results received from facility.  Advised to call back with questions/concerns

## 2023-01-31 ENCOUNTER — Encounter: Payer: Self-pay | Admitting: Nurse Practitioner

## 2023-01-31 NOTE — Progress Notes (Signed)
This encounter was created in error - please disregard.

## 2023-02-16 ENCOUNTER — Ambulatory Visit (INDEPENDENT_AMBULATORY_CARE_PROVIDER_SITE_OTHER): Payer: Medicare Other | Admitting: Neurology

## 2023-02-16 ENCOUNTER — Encounter: Payer: Self-pay | Admitting: Neurology

## 2023-02-16 VITALS — BP 144/77 | HR 89 | Ht 66.0 in | Wt 163.2 lb

## 2023-02-16 DIAGNOSIS — Z23 Encounter for immunization: Secondary | ICD-10-CM | POA: Diagnosis not present

## 2023-02-16 DIAGNOSIS — G43009 Migraine without aura, not intractable, without status migrainosus: Secondary | ICD-10-CM | POA: Diagnosis not present

## 2023-02-16 DIAGNOSIS — G40209 Localization-related (focal) (partial) symptomatic epilepsy and epileptic syndromes with complex partial seizures, not intractable, without status epilepticus: Secondary | ICD-10-CM | POA: Diagnosis not present

## 2023-02-16 MED ORDER — BUTALBITAL-APAP-CAFFEINE 50-325-40 MG PO TABS: ORAL_TABLET | ORAL | 5 refills | Status: AC

## 2023-02-16 NOTE — Progress Notes (Signed)
NEUROLOGY FOLLOW UP OFFICE NOTE  Tonya Thompson [id number] [birth year]  HISTORY OF PRESENT ILLNESS: I had the pleasure of seeing Tonya Thompson in follow-up in the neurology clinic on 02/16/2023.  The patient was last seen a year ago for well-controlled seizures and migraines. She is alone in the office today. Records and images were personally reviewed where available.  Since her last visit, she continues to be seizure-free since 1998. She also has migraines and takes Topiramate 300mg  BID for seizure and migraine prophylaxis. She reports headaches at least once a week, sometimes twice. In the past, she was prescribed prn Fioricet by her prior neurologist with good effect. She now only has Tylenol and an ice pack and asks if she can have an order for prn Fioricet for severe headaches. She denies any vision changes, no new focal symptoms. No falls. She uses a walker to ambulate. Sleep is good. She continues to be active at her ALF, she is with the [employer] meeting once a week.   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   1. Seizures. She had a hemorrhagic stroke at age 66, then had subsequent strokes after, always affecting the right side, last was in 1984. She has been on Topiramate 300mg  BID for many years, switched to generic in 1999. Her last seizure was in 1998. She was last seen by Dr. Arville Go at the [facility] in 06/2018. Per notes, she has abnormal EEGs with left hemisphere slowing, maximal left temporal central area with epileptiform activity. She denies any side effects on Topiramate. She denies any staring/unresponsive episodes, gaps in time, olfactory/gustatory hallucinations, myoclonic jerks. She had a normal birth and early  development.  There is no history of febrile convulsions, CNS infections such as meningitis/encephalitis, or family history of seizures.  2. Status migrainosus. She has a history of migraines with prn Fioricet for rescue. She reports being migraine-free since 2017, however at that time she was also in status migrainosus with migraines that lasted 8 days. She recalls taking a 1-week prednisone course with Dr. August Saucer with resolution of migraines. This headache started 2 weeks ago, she reports 10/10 throbbing pain in the frontal regions, with tenderness to palpation. She has been nauseated, no vomiting. No visual obscurations. She is sensitive to lights. She was prescribed Prednisone 20mg  2 tab daily for 3 days, then 1 tab daily for 4 days. She is saying she was only taking 1 tablet daily and took it for only 3-4 days because it was not working. Headaches progressively worsened that she has been to the ER 3 times in the past week. In the ER, she has been given IV Reglan/Benadryl, IV Fentanyl, PO Decadron through her PCP, Vicodin from the ER, and most recently IV Depacon 1000mg  and magnesium 2 days ago. She had a sample of Nurtec yesterday from PCP office which only made her sleep 2 hours but did not help with headache. She had a CTA head and neck on 07/24/20 which did not show any acute changes. There was a chronic left MCA infarct with small areas of calcific density within the infarct. There was a cluster of vessels in the left sylvian fissure compatible with residual AVM, appears to drain into the vein of Labbe. ESR was normal (11). She states she stopped  the Dexamethasone for a few days but took it again this morning. She states that she had done well with migraines by staying away from food triggers, she thinks there may have been something in her food that set this off. She manages her medications and denies missing doses. She has some soreness in her neck. She takes Baclofen for leg spasticity. She used to get 8  hours of sleep, but since headaches recurred, she only gets 5 hours.   Update 07/29/21: She presents for an earlier visit after hospitalization from 3/12-3/17/2023 for altered mental status. She recently had hip surgery for a hip fracture, then after arriving to the facility, she was more confused and sent back to the ER. She was treated for a UTI with urine culture showing E.coli and E. Cloacae. 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 She had an overnight EEG which showed spikes in the left centrotemporal region, focal slowing in the left temporal region and diffuse slowing, no seizures seen. Levetiracetam was stopped. She was discharged on home dose of Topiramate 300mg  BID. She had initially presented in status migrainosus in 07/2020, at which time Depakote was added. She states today that she does not have migraines as long as she avoids her food triggers. She denies any significant headaches and uses an ice pack in the back of her head as needed. She feels her thinking is clearer, speech today is fluent, she is able to report that she is feeling a little down due to her father's passing before she had her hip fracture. She feels she is overall doing better, doing really well with therapy. She takes oxycodone every 4-6 hours for post-surgical pain. She wakes up every so often at night and naps during the day.    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     baclofen (LIORESAL) 10 MG tablet Take 10 mg by mouth at bedtime.     Calcium Carbonate 500 MG CHEW Chew 500 mg by mouth in the morning and at bedtime.     fexofenadine (ALLEGRA) 180 MG tablet Take 180 mg by mouth daily.     guaiFENesin (MUCINEX) 600 MG 12 hr tablet Take 600 mg by mouth 2 (two) times daily as needed for cough.     Multiple Vitamins-Minerals (THEREMS-M) TABS Take 1 tablet by mouth  daily.     nystatin (MYCOSTATIN/NYSTOP) powder Apply 1 application. topically 2 (two) times daily as needed (rash under breast).     Omega-3 Fatty Acids (OMEGA-3 FISH OIL PO) Take 1 capsule by mouth daily.     omeprazole (PRILOSEC) 20 MG capsule Take 20 mg by mouth daily.     polyethylene glycol (MIRALAX / GLYCOLAX) 17 g packet Take 17 g by mouth daily as needed for mild constipation. 14 each 0   Propylene Glycol (SYSTANE BALANCE) 0.6 % SOLN Place 1 drop into both eyes 2 (two) times daily.     rosuvastatin (CRESTOR) 10 MG tablet Take 1 tablet (10 mg total) by mouth daily. 90 tablet 3   tiZANidine (ZANAFLEX) 2 MG tablet Take 1 tablet as needed once a day for headache (Give Tizanidine instead of Tylenol) 30 tablet 6   topiramate (TOPAMAX) 100 MG tablet Take 300 mg by mouth 2 (two) times daily.     Vitamin D3 (VITAMIN D) 25 MCG tablet Take 1,000 Units by mouth daily.     zinc oxide 20 % ointment Apply 1 application. topically as needed (after every incontinent episode).     No current  facility-administered medications on file prior to visit.    ALLERGIES: Allergies  Allergen Reactions   Aspirin Other (See Comments)    CONTRAINDICATED DUE TO HISTORY OF BLEEDING IN BRAIN   Cheese     Cant take due to history of severe migraines   Chocolate     Cant take due to history of severe migraines   Coffee Flavor     Cant take due to history of severe migraines   Other      Nuts - Cant take due to history of severe migraines   Red Wine Complex [Germanium]     Cant take due to history of severe migraines    FAMILY HISTORY: Family History  Problem Relation Age of Onset   Heart disease Mother    Heart failure Mother    Heart attack Mother    Diabetes Father    Hyperlipidemia Father    Dementia Father    Prostate cancer Father    Melanoma Father    Colon polyps Father    Hyperlipidemia Paternal Uncle    Hypertension Paternal Uncle    Dementia Paternal Uncle    Heart attack Maternal Grandfather    Stroke Paternal Grandmother    CAD Maternal Uncle    COPD Maternal Uncle        smoker   Breast cancer Neg Hx    Colon cancer Neg Hx    Esophageal cancer Neg Hx    Stomach cancer Neg Hx    Rectal cancer Neg Hx     SOCIAL HISTORY: Social History   Socioeconomic History   Marital status: Single    Spouse name: Not on file   Number of children: Not on file   Years of education: Not on file   Highest education level: Not on file  Occupational History   Not on file  Tobacco Use   Smoking status: Never    Passive exposure: Past   Smokeless tobacco: Never  Vaping Use   Vaping status: Never Used  Substance and Sexual Activity   Alcohol use: No   Drug use: No   Sexual activity: Not Currently    Birth control/protection: None    Comment: 1st intercourse 67 yo-Fewer than 5 partners  Other Topics Concern   Not on file  Social History Narrative  Right handed until 3rd grade    Left handed   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    Social Determinants of  Health   Financial Resource Strain: Not on file  Food Insecurity: Not on file  Transportation Needs: Not on file  Physical Activity: Inactive (08/22/2018)   Exercise Vital Sign    Days of Exercise per Week: 0 days    Minutes of Exercise per Session: 0 min  Stress: No Stress Concern Present (08/22/2018)   [employer] of Occupational Health - Occupational Stress Questionnaire    Feeling of Stress : Only a little  Social Connections: Not on file  Intimate Partner Violence: Not on file     PHYSICAL EXAM: Vitals:   02/16/23 0934 02/16/23 1031  BP: (!) 160/85 (!) 144/77  Pulse: 89   SpO2: 96%    General: No acute distress Head:  Normocephalic/atraumatic Skin/Extremities: No rash, no edema Neurological Exam: alert and awake. No aphasia or dysarthria. Fund of knowledge is appropriate.   Attention and concentration are normal.   Cranial nerves: Pupils equal, round. Extraocular movements intact with no nystagmus. Visual fields full.  No facial asymmetry.  Motor: increased tone on right UE and LE with spastic contracture of right UE. Gait slow and cautious with walker with right spastic hemiparetic gait, holds walker with left hand. No tremors.    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 MRI/MRV/CTA in 08/2020 no acute changes. She has been seizure-free since 1998. She is satisfied with migraine control on Topiramate 300mg  BID but asks if she can have prn Fioricet for severe headaches if Tylenol does not help. Discussed side effects of Fioricet and minimizing intake to 2-3 a week to avoid rebound headaches. She does not drive. Follow-up in 1 year, call for any changes.    Thank you for allowing me to participate in her care.  Please do not hesitate to call for any questions or concerns.   Patrcia Dolly,  M.D.   CC: Man Mast, NP

## 2023-02-16 NOTE — Patient Instructions (Signed)
Always a pleasure to see you.  Continue Topiramate 300mg  twice a day.  2. May take as needed Fioricet once a day as needed for severe migraine. Do not take more than 3 a week  3. Follow-up in 1 year, call for any changes.   Seizure Precautions: 1. If medication has been prescribed for you to prevent seizures, take it exactly as directed.  Do not stop taking the medicine without talking to your doctor first, even if you have not had a seizure in a long time.   2. Avoid activities in which a seizure would cause danger to yourself or to others.  Don't operate dangerous machinery, swim alone, or climb in high or dangerous places, such as on ladders, roofs, or girders.  Do not drive unless your doctor says you may.  3. If you have any warning that you may have a seizure, lay down in a safe place where you can't hurt yourself.    4.  No driving for 6 months from last seizure, as per [facility].   Please refer to the following link on the [employer]'s website for more information: http://www.epilepsyfoundation.org/answerplace/Social/driving/drivingu.cfm   5.  Maintain good sleep hygiene.  6.  Contact your doctor if you have any problems that may be related to the medicine you are taking.  7.  Call 911 and bring the patient back to the ED if:        A.  The seizure lasts longer than 5 minutes.       B.  The patient doesn't awaken shortly after the seizure  C.  The patient has new problems such as difficulty seeing, speaking or moving  D.  The patient was injured during the seizure  E.  The patient has a temperature over 102 F (39C)  F.  The patient vomited and now is having trouble breathing

## 2023-03-07 ENCOUNTER — Non-Acute Institutional Stay: Payer: Self-pay | Admitting: Nurse Practitioner

## 2023-03-07 ENCOUNTER — Encounter: Payer: Self-pay | Admitting: Nurse Practitioner

## 2023-03-07 DIAGNOSIS — R0989 Other specified symptoms and signs involving the circulatory and respiratory systems: Secondary | ICD-10-CM | POA: Diagnosis not present

## 2023-03-07 DIAGNOSIS — D500 Iron deficiency anemia secondary to blood loss (chronic): Secondary | ICD-10-CM | POA: Diagnosis not present

## 2023-03-07 DIAGNOSIS — R4189 Other symptoms and signs involving cognitive functions and awareness: Secondary | ICD-10-CM

## 2023-03-07 DIAGNOSIS — E782 Mixed hyperlipidemia: Secondary | ICD-10-CM

## 2023-03-07 DIAGNOSIS — G40909 Epilepsy, unspecified, not intractable, without status epilepticus: Secondary | ICD-10-CM | POA: Diagnosis not present

## 2023-03-07 DIAGNOSIS — I2510 Atherosclerotic heart disease of native coronary artery without angina pectoris: Secondary | ICD-10-CM | POA: Diagnosis not present

## 2023-03-07 DIAGNOSIS — R269 Unspecified abnormalities of gait and mobility: Secondary | ICD-10-CM | POA: Diagnosis not present

## 2023-03-07 DIAGNOSIS — N1832 Chronic kidney disease, stage 3b: Secondary | ICD-10-CM | POA: Diagnosis not present

## 2023-03-07 DIAGNOSIS — E876 Hypokalemia: Secondary | ICD-10-CM | POA: Diagnosis not present

## 2023-03-07 DIAGNOSIS — G43909 Migraine, unspecified, not intractable, without status migrainosus: Secondary | ICD-10-CM | POA: Diagnosis not present

## 2023-03-07 DIAGNOSIS — R918 Other nonspecific abnormal finding of lung field: Secondary | ICD-10-CM

## 2023-03-07 DIAGNOSIS — R7989 Other specified abnormal findings of blood chemistry: Secondary | ICD-10-CM

## 2023-03-07 DIAGNOSIS — K219 Gastro-esophageal reflux disease without esophagitis: Secondary | ICD-10-CM

## 2023-03-07 DIAGNOSIS — G8191 Hemiplegia, unspecified affecting right dominant side: Secondary | ICD-10-CM

## 2023-03-07 NOTE — Assessment & Plan Note (Signed)
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

## 2023-03-07 NOTE — Assessment & Plan Note (Signed)
difficulty word findings/forming sentences. 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 MMSE 30/30 08/19/22

## 2023-03-07 NOTE — Assessment & Plan Note (Signed)
lateral right peripheral vision deficit, right wrist contracture, as an result of a hemorrhagic stroke at age of 34 and subsequent strokes x5 per patient. takes Baclofen nightly.              Hx of cerebral AVMs

## 2023-03-07 NOTE — Assessment & Plan Note (Signed)
Stable, no meds

## 2023-03-07 NOTE — Assessment & Plan Note (Addendum)
Elevated alk phos 173 2/29/42, 161 01/04/23, 07/02/22 cardiology, hold Crestor x 2 weeks, repeat LFT 07/15/22 LFT wnl except Alk phos 154(37-153)

## 2023-03-07 NOTE — Assessment & Plan Note (Signed)
patient ambulated with walker, R ankle brace.

## 2023-03-07 NOTE — Assessment & Plan Note (Addendum)
no angina. F/u Cardiology. 07/01/22 Cardiology, no change of meds, LDL, LFT,  EKG

## 2023-03-07 NOTE — Assessment & Plan Note (Signed)
Dysphagia 2, thin liquid.              GERD, stable

## 2023-03-07 NOTE — Progress Notes (Unsigned)
Location:   [facility] Room Number: 819-A Place of Service:  ALF [phone]) Provider: Chipper Oman NP  Patient Care Team: Omelia Marquart X, NP as PCP - General (Internal Medicine) Christell Constant, MD as PCP - Cardiology (Cardiology) Van Clines, MD as Consulting Physician (Neurology) Annie Sable, MD as Consulting Physician (Nephrology) Heloise Purpura, MD as Consulting Physician (Urology)  Extended Emergency Contact Information Primary Emergency Contact: Debbrah Alar [place] of [place] Home Phone: [phone] Mobile Phone: [phone] Relation: Relative Secondary Emergency Contact: Roper,Paula Address: [address]          [place], [place] [address] D[place] of [place] Home Phone: [phone] Mobile Phone: [phone] Relation: Sister  Code Status: DNR Goals of Care: Advanced Directive information    02/16/2023    9:40 AM  Advanced Directives  Does Patient Have a Medical Advance Directive? Yes  Type of Estate agent of [place];Living will     Chief Complaint  Patient presents with  . Medical Management of Chronic Issues    Routine visit. Discuss need for shingles, pneumonia vaccine,  and cervical cancer screening    HPI: Patient is a 67 y.o. female seen in today for managing chronic medical conditions.   Elevated alk phos 173 2/29/42, 161 01/04/23 Constipation, stable, takes Senokot, MiraLax             R hip pain, on Tylenol, Oxycodone for pain, R hip ORIF 07/09/21             Dysphagia 2, thin liquid.              GERD, stable             Hypokalemia, K 3.5 01/04/23             CAD, no angina. F/u Cardiology.              HTN controlled w/o meds.              HLD LDL 74 01/04/23, Cardiology Rosuvastatin             Impaired cognition, difficulty word findings/forming sentences. 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 MMSE 30/30 08/19/22 Migraines, no flare ups  since 2017, Tylenol, Zanaflex, treated with steroids in the past.              Pulmonary nodules, f/u Pulmonology, R upper lobe nodule, had bronchoscopy 04/21/21-negative for malignancy or infection.              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 takes Baclofen nightly.              Hx of cerebral AVMs             CKD III, Bun/creat 28/1.14 01/03/33             Gait abnormality, patient ambulated with walker, R ankle brace.              Anemia, Hgb 14.7 01/04/23         07/27/2022   12:32 PM 07/14/2022    9:17 AM 01/27/2021  9:14 PM 12/19/2020    9:08 AM 07/21/2020    6:00 PM  Depression screen PHQ 2/9  Decreased Interest 0 0 0 0 0  Down, Depressed, Hopeless 0 0 0 0 0  PHQ - 2 Score 0 0 0 0 0       07/29/2021    1:08 PM 02/26/2022    1:30 PM 07/14/2022    9:17 AM 07/27/2022   12:32 PM 02/16/2023    9:40 AM  Fall Risk  Falls in the past year? 1 0 0 0 0  Was there an injury with Fall? 1 0 0 0 0  Fall Risk Category Calculator 2 0 0 0 0  Fall Risk Category (Retired) Moderate Low     (RETIRED) Patient Fall Risk Level Moderate fall risk High fall risk     Patient at Risk for Falls Due to   History of fall(s) History of fall(s)   Fall risk Follow up  Falls evaluation completed Falls evaluation completed Falls evaluation completed Falls evaluation completed       No data to display           Health Maintenance  Topic Date Due  . Zoster Vaccines- Shingrix (1 of 2) 01/30/1977  . Cervical Cancer Screening (HPV/Pap Cotest)  12/06/2020  . Pneumonia Vaccine 22+ Years old (3 of 3 - PPSV23 or PCV20) 01/31/2023  . COVID-19 Vaccine (9 - 2023-24 season) 04/13/2023  . Medicare Annual Wellness  (AWV)  08/18/2023  . Colonoscopy  01/09/2024  . MAMMOGRAM  11/28/2024  . DTaP/Tdap/Td (4 - Td or Tdap) 10/18/2029  . INFLUENZA VACCINE  Completed  . DEXA SCAN  Completed  . HPV VACCINES  Aged Out  . Hepatitis C Screening  Discontinued  . HIV Screening  Discontinued    Urinary incontinence? Functional Status Survey:   Exercise? Diet? No results found. Dentition: Pain:  Past Medical History:  Diagnosis Date  . Acute kidney injury (HCC) 09/03/2019   05-2019 kidney stone removed  . Allergy    seasonal allergies  . Arthritis    LEFT hand  . AVM (arteriovenous malformation) brain   . Cataract    bilateral-not a surgical candidate at this time (07/23/2020)  . COVID-19 virus infection 08/2020  . GERD (gastroesophageal reflux disease)    on meds  . Hyperlipidemia    diet controlled  . Insulin resistance 02/16/2018  . Seizures (HCC)    Grand Mal & Partial complex seizure disorder-last 1998  . Stroke [facility])    5 total so far (age [phone])  . Ureteral stone 05/10/2019   has multiple kidney stones noted from Urologist  . Uses roller walker     Past Surgical History:  Procedure Laterality Date  . BRAIN SURGERY  09/19/1978   at [facility], Dr. Berdine Dance  . BRONCHIAL BIOPSY  04/21/2021   Procedure: BRONCHIAL BIOPSIES;  Surgeon: Josephine Igo, DO;  Location: [facility];  Service: Pulmonary;;  . BRONCHIAL BRUSHINGS  04/21/2021   Procedure: BRONCHIAL BRUSHINGS;  Surgeon: Josephine Igo, DO;  Location: [facility];  Service: Pulmonary;;  . BRONCHIAL WASHINGS  04/21/2021   Procedure: BRONCHIAL WASHINGS;  Surgeon: Josephine Igo, DO;  Location: [facility];  Service: Pulmonary;;  . COLONOSCOPY  2017   [place]  . CYSTOSCOPY/URETEROSCOPY/HOLMIUM LASER/STENT PLACEMENT Left 05/10/2019   Procedure: CYSTOSCOPY/RETROGRADE/URETEROSCOPY/HOLMIUM LASER/STENT PLACEMENT;  Surgeon: Heloise Purpura, MD;  Location: [facility];  Service: Urology;  Laterality: Left;  . FIDUCIAL MARKER  PLACEMENT  04/21/2021   Procedure:  FIDUCIAL MARKER PLACEMENT;  Surgeon: Josephine Igo, DO;  Location: [facility];  Service: Pulmonary;;  . FOOT SURGERY Right 10/1985   ankle fusion, at [facility], Dr. Nyra Capes  . INTRAMEDULLARY (IM) NAIL INTERTROCHANTERIC Right 07/09/2021   Procedure: INTRAMEDULLARY (IM) NAIL INTERTROCHANTRIC;  Surgeon: Samson Frederic, MD;  Location: [facility];  Service: Orthopedics;  Laterality: Right;  . POLYPECTOMY  2017   multiple adenomas  . TUBAL LIGATION  1981  . VIDEO BRONCHOSCOPY WITH RADIAL ENDOBRONCHIAL ULTRASOUND  04/21/2021   Procedure: RADIAL ENDOBRONCHIAL ULTRASOUND;  Surgeon: Josephine Igo, DO;  Location: [facility];  Service: Pulmonary;;  . WISDOM TOOTH EXTRACTION      The patient has a family history of  shoulder Social History   Socioeconomic History  . Marital status: Single    Spouse name: Not on file  . Number of children: Not on file  . Years of education: Not on file  . Highest education level: Not on file  Occupational History  . Not on file  Tobacco Use  . Smoking status: Never    Passive exposure: Past  . Smokeless tobacco: Never  Vaping Use  . Vaping status: Never Used  Substance and Sexual Activity  . Alcohol use: No  . Drug use: No  . Sexual activity: Not Currently    Birth control/protection: None    Comment: 1st intercourse 67 yo-Fewer than 5 partners  Other Topics Concern  . Not on file  Social History Narrative   Right handed until 3rd grade    Left handed   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    Social Determinants of Health   Financial Resource Strain: Not on file  Food Insecurity: Not on file  Transportation Needs: Not on file  Physical Activity: Inactive (08/22/2018)   Exercise Vital Sign   . Days of Exercise per Week: 0 days   . Minutes of Exercise per Session: 0 min  Stress: No Stress Concern Present (08/22/2018)   [employer] of Occupational Health - Occupational Stress Questionnaire   . Feeling of  Stress : Only a little  Social Connections: Not on file  Intimate Partner Violence: Not on file    Allergies  Allergen Reactions  . Aspirin Other (See Comments)    CONTRAINDICATED DUE TO HISTORY OF BLEEDING IN BRAIN  . Cheese     Cant take due to history of severe migraines  . Chocolate     Cant take due to history of severe migraines  . Coffee Flavor     Cant take due to history of severe migraines  . Other      Nuts - Cant take due to history of severe migraines  . Red Wine Complex [Germanium]     Cant take due to history of severe migraines    Allergies as of 03/07/2023       Reactions   Aspirin Other (See Comments)   CONTRAINDICATED DUE TO HISTORY OF BLEEDING IN BRAIN   Cheese    Cant take due to history of severe migraines   Chocolate    Cant take due to history of severe migraines   Coffee Flavor    Cant take due to history of severe migraines   Other     Nuts - Cant take due to history of severe migraines   Red Wine Complex [germanium]    Cant take due to history of severe migraines        Medication List  Accurate as of March 07, 2023 11:59 PM. If you have any questions, ask your nurse or doctor.          acetaminophen 325 MG tablet Commonly known as: TYLENOL Take 650 mg by mouth every 4 (four) hours as needed for headache or mild pain.   baclofen 10 MG tablet Commonly known as: LIORESAL Take 10 mg by mouth at bedtime.   butalbital-acetaminophen-caffeine 50-325-40 MG tablet Commonly known as: FIORICET Take 1 tablet once a day as needed for severe migraine. Do not take more than 3 a week.   Calcium Carbonate 500 MG Chew Chew 500 mg by mouth in the morning and at bedtime.   fexofenadine 180 MG tablet Commonly known as: ALLEGRA Take 180 mg by mouth daily.   nystatin powder Commonly known as: MYCOSTATIN/NYSTOP Apply 1 application. topically 2 (two) times daily as needed (rash under breast).   OMEGA-3 FISH OIL PO Take 1 capsule by  mouth daily.   omeprazole 20 MG capsule Commonly known as: PRILOSEC Take 20 mg by mouth daily.   polyethylene glycol 17 g packet Commonly known as: MIRALAX / GLYCOLAX Take 17 g by mouth daily as needed for mild constipation.   rosuvastatin 10 MG tablet Commonly known as: CRESTOR Take 1 tablet (10 mg total) by mouth daily.   Systane Balance 0.6 % Soln Generic drug: Propylene Glycol Place 1 drop into both eyes 2 (two) times daily.   Therems-M Tabs Take 1 tablet by mouth daily.   tiZANidine 2 MG tablet Commonly known as: ZANAFLEX Take 1 tablet as needed once a day for headache (Give Tizanidine instead of Tylenol)   topiramate 100 MG tablet Commonly known as: TOPAMAX Take 300 mg by mouth 2 (two) times daily.   vitamin D3 25 MCG tablet Commonly known as: CHOLECALCIFEROL Take 1,000 Units by mouth daily.   zinc oxide 20 % ointment Apply 1 application. topically as needed (after every incontinent episode).         Review of Systems:  Review of Systems  Constitutional:  Negative for activity change, fatigue and fever.  HENT:  Positive for trouble swallowing. Negative for congestion.   Eyes:  Negative for visual disturbance.       Lateral right peripheral vision lost  Respiratory:  Negative for cough, shortness of breath and wheezing.   Cardiovascular:  Positive for leg swelling.  Gastrointestinal:  Negative for abdominal pain and constipation.  Genitourinary:  Positive for frequency. Negative for dysuria and urgency.       Chronic  Musculoskeletal:  Positive for arthralgias and gait problem.       R hip pain, better. Right ankle brace. R wrist contracture.   Skin:  Negative for color change.  Neurological:  Positive for speech difficulty and weakness. Negative for seizures and headaches.       Difficulty words finding, memory lapses.   Psychiatric/Behavioral:  Negative for behavioral problems and sleep disturbance. The patient is not nervous/anxious.     Physical  Exam: Vitals:   03/07/23 1053  BP: (!) 141/87  Pulse: 83  Resp: 18  Temp: 98.2 F (36.8 C)  SpO2: 95%  Weight: 164 lb 12.8 oz (74.8 kg)  Height: 5\' 6"  (1.676 m)   Body mass index is 26.6 kg/m. Physical Exam Vitals and nursing note reviewed.  Constitutional:      Appearance: Normal appearance.  HENT:     Head: Normocephalic and atraumatic.     Nose: Nose normal.     Mouth/Throat:  Mouth: Mucous membranes are moist.  Eyes:     Extraocular Movements: Extraocular movements intact.     Pupils: Pupils are equal, round, and reactive to light.  Cardiovascular:     Rate and Rhythm: Normal rate and regular rhythm.     Heart sounds: No murmur heard. Pulmonary:     Effort: Pulmonary effort is normal.     Breath sounds: No rales.  Abdominal:     General: Bowel sounds are normal.     Palpations: Abdomen is soft.     Tenderness: There is no abdominal tenderness.  Musculoskeletal:        General: Deformity present.     Cervical back: Normal range of motion and neck supple.     Right lower leg: Edema present.     Left lower leg: Edema present.     Comments: Right wrist contracture, right ankle brace. Trace edema BLE  Skin:    General: Skin is warm and dry.  Neurological:     General: No focal deficit present.     Mental Status: She is alert and oriented to person, place, and time. Mental status is at baseline.     Cranial Nerves: No cranial nerve deficit.     Motor: Weakness present.     Coordination: Coordination abnormal.     Gait: Gait abnormal.     Comments: Right wrist contracture, muscle strength 1/5 RUE, 4-5 RLE. Uses R ankle brace.  Psychiatric:        Mood and Affect: Mood normal.        Behavior: Behavior normal.        Thought Content: Thought content normal.    Labs reviewed: Basic Metabolic Panel: No results for input(s): "NA", "K", "CL", "CO2", "GLUCOSE", "BUN", "CREATININE", "CALCIUM", "MG", "PHOS", "TSH" in the last 8760 hours. Liver Function  Tests: Recent Labs    07/01/22 1104  AST 16  ALT 17  ALKPHOS 173*  BILITOT <0.2  PROT 6.7  ALBUMIN 4.0   No results for input(s): "LIPASE", "AMYLASE" in the last 8760 hours. No results for input(s): "AMMONIA" in the last 8760 hours. CBC: No results for input(s): "WBC", "NEUTROABS", "HGB", "HCT", "MCV", "PLT" in the last 8760 hours. Lipid Panel: Recent Labs    07/01/22 1104  LDLDIRECT 66   Lab Results  Component Value Date   HGBA1C 4.8 12/19/2020    Procedures: No results found.  Assessment/Plan  Migraine  no flare ups since 2017, Tylenol, Zanaflex, treated with steroids in the past.   Pulmonary nodules f/u Pulmonology, R upper lobe nodule, had bronchoscopy 04/21/21-negative for malignancy or infection.  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 MMSE 30/30 08/19/22  Right hemiparesis (HCC)  lateral right peripheral vision deficit, right wrist contracture, as an result of a hemorrhagic stroke at age of 24 and subsequent strokes x5 per patient. takes Baclofen nightly.              Hx of cerebral AVMs  Chronic kidney disease, stage 3b (HCC) Bun/creat 28/1.14 01/03/33  Gait abnormality patient ambulated with walker, R ankle brace.   Blood loss anemia Hgb 14.7 01/04/23  Hyperlipidemia, mixed LDL 74 01/04/23, Cardiology Rosuvastatin  Labile hypertension Stable, no meds.   Coronary artery calcification no angina. F/u Cardiology. 07/01/22 Cardiology, no  change of meds, LDL, LFT,  EKG  Hypokalemia K 3.5 01/04/23  GERD (gastroesophageal reflux disease) Dysphagia 2, thin liquid.              GERD, stable  Elevated LFTs Elevated alk phos 173 2/29/42, 161 01/04/23, 07/02/22 cardiology, hold Crestor x 2 weeks,  repeat LFT 07/15/22 LFT wnl except Alk phos 154(37-153)   Labs/tests ordered:  none  Plan of care reviewed with the patient and charge nurse.   Time spend 40 minutes.

## 2023-03-07 NOTE — Assessment & Plan Note (Signed)
K 3.5 01/04/23

## 2023-03-07 NOTE — Assessment & Plan Note (Signed)
LDL 74 01/04/23, Cardiology Rosuvastatin

## 2023-03-07 NOTE — Assessment & Plan Note (Signed)
Hgb 14.7 01/04/23

## 2023-03-07 NOTE — Assessment & Plan Note (Signed)
f/u Pulmonology, R upper lobe nodule, had bronchoscopy 04/21/21-negative for malignancy or infection.  ?

## 2023-03-07 NOTE — Assessment & Plan Note (Signed)
Bun/creat 28/1.14 01/03/33

## 2023-03-07 NOTE — Assessment & Plan Note (Signed)
no flare ups since 2017, Tylenol, Zanaflex, treated with steroids in the past.  

## 2023-04-20 ENCOUNTER — Encounter: Payer: Self-pay | Admitting: Adult Health

## 2023-04-20 ENCOUNTER — Non-Acute Institutional Stay: Payer: Self-pay | Admitting: Adult Health

## 2023-04-20 DIAGNOSIS — I10 Essential (primary) hypertension: Secondary | ICD-10-CM | POA: Diagnosis not present

## 2023-04-20 DIAGNOSIS — R112 Nausea with vomiting, unspecified: Secondary | ICD-10-CM | POA: Diagnosis not present

## 2023-04-20 NOTE — Progress Notes (Signed)
Location:  [facility] Room Number: [phone] Place of Service:  ALF [phone]) Provider:  Kenard Gower, DNP, FNP-BC  Patient Care Team: Mast, Man X, NP as PCP - General (Internal Medicine) Christell Constant, MD as PCP - Cardiology (Cardiology) Van Clines, MD as Consulting Physician (Neurology) Annie Sable, MD as Consulting Physician (Nephrology) Heloise Purpura, MD as Consulting Physician (Urology)  Extended Emergency Contact Information Primary Emergency Contact: Debbrah Alar [place] of [place] Home Phone: [phone] Mobile Phone: [phone] Relation: Relative Secondary Emergency Contact: Roper,Paula Address: [address]          [place], [place] [address] [place] of [place] Home Phone: [phone] Mobile Phone: [phone] Relation: Sister  Code Status:  DNR  Goals of care: Advanced Directive information    04/20/2023   10:51 AM  Advanced Directives  Does Patient Have a Medical Advance Directive? Yes  Type of Estate agent of [place];Living will;Out of facility DNR (pink MOST or yellow form)  Does patient want to make changes to medical advance directive? No - Patient declined  Copy of Healthcare Power of Attorney in Chart? Yes - validated most recent copy scanned in chart (See row information)  Pre-existing out of facility DNR order (yellow form or pink MOST form) Pink MOST form placed in chart (order not valid for inpatient use);Yellow form placed in chart (order not valid for inpatient use)     Chief Complaint  Patient presents with   Acute Visit     vomited and diarrhea    HPI:  Tonya Thompson is a 67 y.o. female seen today for an acute visit regarding vomiting and diarrhea. She is a resident of [facility]. She had diarrhea and vomiting this morning. She stated that she has eaten chicken noodle soup broth this morning and has good appetite.   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 Berdine Dance   BRONCHIAL BIOPSY  04/21/2021   Procedure: BRONCHIAL BIOPSIES;  Surgeon: Josephine Igo, DO;  Location: [facility];  Service: Pulmonary;;   BRONCHIAL BRUSHINGS  04/21/2021   Procedure: BRONCHIAL BRUSHINGS;  Surgeon: Josephine Igo, DO;  Location: [facility];  Service: Pulmonary;;   BRONCHIAL WASHINGS  04/21/2021   Procedure: BRONCHIAL WASHINGS;  Surgeon: Josephine Igo, DO;  Location: [facility];  Service: Pulmonary;;   COLONOSCOPY  2017   [place], [place]   CYSTOSCOPY/URETEROSCOPY/HOLMIUM LASER/STENT PLACEMENT Left 05/10/2019   Procedure: CYSTOSCOPY/RETROGRADE/URETEROSCOPY/HOLMIUM LASER/STENT PLACEMENT;  Surgeon: Heloise Purpura, MD;  Location: [facility];  Service: Urology;  Laterality: Left;   FIDUCIAL MARKER PLACEMENT  04/21/2021   Procedure: FIDUCIAL MARKER PLACEMENT;  Surgeon: Josephine Igo, DO;  Location: [facility];  Service: Pulmonary;;   FOOT SURGERY Right 10/1985   ankle fusion, at [facility], Dr. Nyra Capes   INTRAMEDULLARY (IM) NAIL INTERTROCHANTERIC Right 07/09/2021   Procedure: INTRAMEDULLARY (IM) NAIL INTERTROCHANTRIC;  Surgeon: Samson Frederic, MD;  Location: [facility];  Service: Orthopedics;  Laterality: Right;   POLYPECTOMY  2017    multiple adenomas   TUBAL LIGATION  1981   VIDEO BRONCHOSCOPY WITH RADIAL ENDOBRONCHIAL ULTRASOUND  04/21/2021   Procedure: RADIAL ENDOBRONCHIAL ULTRASOUND;  Surgeon: Josephine Igo, DO;  Location: [facility];  Service: Pulmonary;;   WISDOM TOOTH EXTRACTION      Allergies  Allergen Reactions   Aspirin Other (See Comments)    CONTRAINDICATED DUE TO HISTORY OF BLEEDING IN BRAIN   Cheese     Cant take due to history of severe migraines   Chocolate     Cant take due to history of severe migraines   Coffee Flavoring Agent (Non-Screening)     Cant take due to history of severe migraines   Other      Nuts - Cant take due to history of severe migraines   Red Wine Complex [Germanium]     Cant take due to history of severe migraines    Outpatient Encounter Medications as of 04/20/2023  Medication Sig   acetaminophen (TYLENOL) 325 MG tablet Take 650 mg by mouth every 4 (four) hours as needed for headache or mild pain.   baclofen (LIORESAL) 10 MG tablet Take 10 mg by mouth at bedtime.   butalbital-acetaminophen-caffeine (FIORICET) 50-325-40 MG tablet Take 1 tablet once a day as needed for severe migraine. Do not take more than 3 a week.   Calcium Carbonate 500 MG CHEW Chew 500 mg by mouth in the morning and at bedtime.   Multiple Vitamins-Minerals (THEREMS-M) TABS Take 1 tablet by mouth daily.   nystatin (MYCOSTATIN/NYSTOP) powder Apply 1 application. topically 2 (two) times daily as needed (rash under breast).   Omega-3 Fatty Acids (OMEGA-3 FISH OIL PO) Take 1 capsule by mouth daily.   omeprazole (PRILOSEC) 20 MG capsule Take 20 mg by mouth daily.   ondansetron (ZOFRAN) 4 MG tablet Take 4 mg by mouth every 6 (six) hours as needed for nausea or vomiting.   polyethylene glycol (MIRALAX / GLYCOLAX) 17 g packet Take 17 g by mouth daily as needed for mild constipation.   Propylene Glycol (SYSTANE BALANCE) 0.6 % SOLN Place 1 drop into both eyes 2 (two) times daily.   rosuvastatin (CRESTOR)  10 MG tablet Take 1 tablet (10 mg total) by mouth daily.   tiZANidine (ZANAFLEX) 2 MG tablet Take 1 tablet as needed once a day for headache (Give Tizanidine instead of Tylenol)   topiramate (TOPAMAX) 100 MG tablet Take 300 mg by mouth 2 (two) times daily.   Vitamin D3 (VITAMIN D) 25 MCG tablet Take 1,000 Units by mouth daily.   zinc oxide 20 % ointment Apply 1 application. topically as needed (after every incontinent episode).   fexofenadine (ALLEGRA) 180 MG tablet Take 180 mg by mouth daily. (Patient not taking: Reported on 03/07/2023)   No facility-administered encounter medications on file as of 04/20/2023.    Review of Systems  Constitutional:  Negative for appetite change, chills, fatigue and fever.  HENT:  Negative for congestion, hearing loss, rhinorrhea and sore throat.   Eyes: Negative.   Respiratory:  Negative for cough, shortness of breath and wheezing.   Cardiovascular:  Negative for chest pain, palpitations  and leg swelling.  Gastrointestinal:  Positive for diarrhea, nausea and vomiting. Negative for abdominal pain and constipation.  Genitourinary:  Negative for dysuria.  Musculoskeletal:  Negative for arthralgias, back pain and myalgias.  Skin:  Negative for color change, rash and wound.  Neurological:  Positive for weakness. Negative for dizziness and headaches.  Psychiatric/Behavioral:  Negative for behavioral problems. The patient is not nervous/anxious.      Immunization History  Administered Date(s) Administered   Influenza Inj Mdck Quad Pf 02/16/2017   Influenza Inj Mdck Quad With Preservative 02/16/2018, 03/01/2019   Influenza, High Dose Seasonal PF 02/02/2023   Influenza,inj,Quad PF,6+ Mos 03/01/2017   Influenza,inj,quad, With Preservative 02/18/2015   Influenza-Unspecified 03/01/2017, 12/17/2019, 02/24/2021, 02/22/2022   Moderna Covid-19 Vaccine Bivalent Booster 27yrs & up 09/18/2021, 02/16/2023   PFIZER(Purple Top)SARS-COV-2 Vaccination 07/22/2019, 08/12/2019,  03/10/2020, 11/10/2020, 01/20/2021   PPD Test 07/17/2021, 07/30/2021   Pfizer Covid-19 Vaccine Bivalent Booster 63yrs & up 03/04/2022   Pneumococcal Conjugate-13 04/18/2016   Pneumococcal Polysaccharide-23 05/03/2004   Pneumococcal-Unspecified 05/03/2004   Respiratory Syncytial Virus Vaccine,Recomb Aduvanted(Arexvy) 04/30/2022   Td 10/19/2019   Td (Adult),unspecified 10/19/2019   Tdap 08/11/2009   Zoster, Live 04/14/2012   Pertinent  Health Maintenance Due  Topic Date Due   Colonoscopy  01/09/2024   MAMMOGRAM  11/28/2024   INFLUENZA VACCINE  Completed   DEXA SCAN  Completed      07/29/2021    1:08 PM 02/26/2022    1:30 PM 07/14/2022    9:17 AM 07/27/2022   12:32 PM 02/16/2023    9:40 AM  Fall Risk  Falls in the past year? 1 0 0 0 0  Was there an injury with Fall? 1 0 0 0 0  Fall Risk Category Calculator 2 0 0 0 0  Fall Risk Category (Retired) Moderate Low     (RETIRED) Patient Fall Risk Level Moderate fall risk High fall risk     Patient at Risk for Falls Due to   History of fall(s) History of fall(s)   Fall risk Follow up  Falls evaluation completed Falls evaluation completed Falls evaluation completed Falls evaluation completed     Vitals:   04/20/23 1046  BP: (!) 143/83  Pulse: 89  Resp: 19  Temp: (!) 97.1 F (36.2 C)  SpO2: 97%  Weight: 165 lb 6.4 oz (75 kg)  Height: 5\' 5"  (1.651 m)   Body mass index is 27.52 kg/m.  Physical Exam Constitutional:      Appearance: Normal appearance.  HENT:     Head: Normocephalic and atraumatic.     Nose: Nose normal.     Mouth/Throat:     Mouth: Mucous membranes are moist.  Eyes:     Conjunctiva/sclera: Conjunctivae normal.  Cardiovascular:     Rate and Rhythm: Normal rate and regular rhythm.  Pulmonary:     Effort: Pulmonary effort is normal.     Breath sounds: Normal breath sounds.  Abdominal:     General: Bowel sounds are normal.     Palpations: Abdomen is soft.  Musculoskeletal:     Cervical back: Normal range  of motion.  Skin:    General: Skin is warm and dry.  Neurological:     Mental Status: She is alert and oriented to person, place, and time.     Motor: Weakness present.     Comments: Right hemiparesis  Psychiatric:        Mood and Affect: Mood normal.        Behavior:  Behavior normal.        Thought Content: Thought content normal.        Judgment: Judgment normal.      Labs reviewed: No results for input(s): "NA", "K", "CL", "CO2", "GLUCOSE", "BUN", "CREATININE", "CALCIUM", "MG", "PHOS" in the last 8760 hours. Recent Labs    07/01/22 1104  AST 16  ALT 17  ALKPHOS 173*  BILITOT <0.2  PROT 6.7  ALBUMIN 4.0   No results for input(s): "WBC", "NEUTROABS", "HGB", "HCT", "MCV", "PLT" in the last 8760 hours. Lab Results  Component Value Date   TSH 3.039 07/15/2021   Lab Results  Component Value Date   HGBA1C 4.8 12/19/2020   Lab Results  Component Value Date   CHOL 158 07/09/2021   HDL 58 07/09/2021   LDLCALC 85 07/09/2021   LDLDIRECT 66 07/01/2022   TRIG 74 07/09/2021   CHOLHDL 2.7 07/09/2021    Significant Diagnostic Results in last 30 days:  No results found.  Assessment/Plan  1. Nausea and vomiting, unspecified vomiting type (Primary) -  will start on Zofran 4 mg 1 tab p.o. every 6 hours PRN -   Check CBC BMP  -   Encourage oral fluids  Family/ staff Communication: Discussed plan of care with resident and charge nurse.  Labs/tests ordered: BMP and CBC stat   Kenard Gower, DNP, MSN, FNP-BC [facility] [phone] (Monday-Friday 8:00 a.m. - 5:00 p.m.) [phone] (after hours)

## 2023-06-08 ENCOUNTER — Encounter: Payer: Self-pay | Admitting: Nurse Practitioner

## 2023-06-08 ENCOUNTER — Non-Acute Institutional Stay: Payer: Medicare Other | Admitting: Nurse Practitioner

## 2023-06-08 DIAGNOSIS — K5901 Slow transit constipation: Secondary | ICD-10-CM | POA: Diagnosis not present

## 2023-06-08 DIAGNOSIS — J101 Influenza due to other identified influenza virus with other respiratory manifestations: Secondary | ICD-10-CM | POA: Diagnosis not present

## 2023-06-08 DIAGNOSIS — G40909 Epilepsy, unspecified, not intractable, without status epilepticus: Secondary | ICD-10-CM

## 2023-06-08 DIAGNOSIS — I2510 Atherosclerotic heart disease of native coronary artery without angina pectoris: Secondary | ICD-10-CM

## 2023-06-08 DIAGNOSIS — R4189 Other symptoms and signs involving cognitive functions and awareness: Secondary | ICD-10-CM

## 2023-06-08 DIAGNOSIS — I10 Essential (primary) hypertension: Secondary | ICD-10-CM

## 2023-06-08 DIAGNOSIS — G8191 Hemiplegia, unspecified affecting right dominant side: Secondary | ICD-10-CM

## 2023-06-08 DIAGNOSIS — G43909 Migraine, unspecified, not intractable, without status migrainosus: Secondary | ICD-10-CM

## 2023-06-08 NOTE — Progress Notes (Signed)
 Location:   [facility] Room Number: 820 Place of Service:  ALF (13) Provider: Larwance Sheetal Lyall NP  Khrystina Bonnes X, NP  Patient Care Team: Zamani Crocker X, NP as PCP - General (Internal Medicine) Santo Stanly LABOR, MD as PCP - Cardiology (Cardiology) Georjean Darice HERO, MD as Consulting Physician (Neurology) Prescilla Beams, MD as Consulting Physician (Nephrology) Renda Glance, MD as Consulting Physician (Urology)  Extended Emergency Contact Information Primary Emergency Contact: Devra Sari [place] Home Phone: [phone] Mobile Phone: [phone] Relation: Relative Secondary Emergency Contact: Roper,Paula Address: [address]          [place], [place] [address] [place] Home Phone: [phone] Mobile Phone: [phone] Relation: Sister  Code Status: DNR Goals of care: Advanced Directive information    04/20/2023   10:51 AM  Advanced Directives  Does Patient Have a Medical Advance Directive? Yes  Type of Estate Agent of [place];Living will;Out of facility DNR (pink MOST or yellow form)  Does patient want to make changes to medical advance directive? No - Patient declined  Copy of Healthcare Power of Attorney in Chart? Yes - validated most recent copy scanned in chart (See row information)  Pre-existing out of facility DNR order (yellow form or pink MOST form) Pink MOST form placed in chart (order not valid for inpatient use);Yellow form placed in chart (order not valid for inpatient use)     Chief Complaint  Patient presents with   Acute Visit    Scratchy throat, cough with clear sputum    HPI:  Pt is a 68 y.o. female seen today for an acute visit for c/o scratchy throat, cough with clear sputum, denied generalized weakness, aches, or pain, no O2 desaturation, she is afebrile. Tested positive Flu A.     Elevated alk phos 173 2/29/42, 161 01/04/23 Constipation, stable, takes Senokot, MiraLax               R hip pain, on Tylenol  for pain, R hip ORIF 07/09/21             Dysphagia 2, thin liquid.              GERD, stable             Hypokalemia, K 3.5 01/04/23             CAD, no angina. F/u Cardiology.              HTN mild elevated Sbp, placed on Lisinopril  5mg  every day.              HLD LDL 74 01/04/23, Cardiology Rosuvastatin              Impaired cognition, difficulty word findings/forming sentences. 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 MMSE 30/30 08/19/22 Migraines, no flare ups since 2017, Tylenol , Zanaflex , treated with steroids in the past.              Pulmonary nodules, f/u Pulmonology, R upper lobe nodule, had bronchoscopy 04/21/21-negative for malignancy or infection.              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 takes Baclofen  nightly.              Hx of cerebral AVMs             CKD III, Bun/creat 28/1.14 01/03/33             Gait abnormality, patient ambulated with walker, R ankle brace.              Anemia, Hgb 13.8 04/21/23    Past Medical History:  Diagnosis Date   Acute kidney injury (HCC) 09/03/2019   05-2019 kidney stone removed   Allergy    seasonal allergies   Arthritis    LEFT hand   AVM (arteriovenous malformation) brain    Cataract    bilateral-not a surgical candidate at this time (07/23/2020)   COVID-19 virus infection 08/2020   GERD (gastroesophageal reflux disease)    on meds   Hyperlipidemia    diet controlled   Insulin  resistance 02/16/2018   Seizures (HCC)    Grand Mal & Partial complex seizure disorder-last 1998   Stroke [facility])    5 total so far (age [phone])   Ureteral stone 05/10/2019   has multiple kidney  stones noted from Urologist   Uses roller walker    Past Surgical History:  Procedure Laterality Date   BRAIN SURGERY  09/19/1978   at [facility], Dr. Alm Sor   BRONCHIAL BIOPSY  04/21/2021   Procedure: BRONCHIAL BIOPSIES;  Surgeon: Brenna Adine CROME, DO;  Location: [facility];  Service: Pulmonary;;   BRONCHIAL BRUSHINGS  04/21/2021   Procedure: BRONCHIAL BRUSHINGS;  Surgeon: Brenna Adine CROME, DO;  Location: [facility];  Service: Pulmonary;;   BRONCHIAL WASHINGS  04/21/2021   Procedure: BRONCHIAL WASHINGS;  Surgeon: Brenna Adine CROME, DO;  Location: [facility];  Service: Pulmonary;;   COLONOSCOPY  2017   [place], [place]   CYSTOSCOPY/URETEROSCOPY/HOLMIUM LASER/STENT PLACEMENT Left 05/10/2019   Procedure: CYSTOSCOPY/RETROGRADE/URETEROSCOPY/HOLMIUM LASER/STENT PLACEMENT;  Surgeon: Renda Glance, MD;  Location: [facility];  Service: Urology;  Laterality: Left;   FIDUCIAL MARKER PLACEMENT  04/21/2021   Procedure: FIDUCIAL MARKER PLACEMENT;  Surgeon: Brenna Adine CROME, DO;  Location: [facility];  Service: Pulmonary;;   FOOT SURGERY Right 10/1985   ankle fusion, at [facility], Dr. Janeece   INTRAMEDULLARY (IM) NAIL INTERTROCHANTERIC Right 07/09/2021   Procedure: INTRAMEDULLARY (IM) NAIL INTERTROCHANTRIC;  Surgeon: Fidel Rogue, MD;  Location: [facility];  Service: Orthopedics;  Laterality: Right;   POLYPECTOMY  2017   multiple adenomas   TUBAL LIGATION  1981   VIDEO BRONCHOSCOPY WITH RADIAL ENDOBRONCHIAL ULTRASOUND  04/21/2021   Procedure: RADIAL ENDOBRONCHIAL ULTRASOUND;  Surgeon: Brenna Adine CROME, DO;  Location: [facility];  Service: Pulmonary;;   WISDOM TOOTH EXTRACTION      Allergies  Allergen Reactions   Aspirin Other (See Comments)    CONTRAINDICATED DUE TO HISTORY OF BLEEDING IN BRAIN   Cheese     Cant take due to history of severe migraines   Chocolate     Cant take due to history of severe migraines   Coffee Flavoring Agent (Non-Screening)     Cant take due to history of severe migraines    Other      Nuts - Cant take due to history of severe migraines   Red Wine Complex [Germanium]     Cant take due to history of severe migraines    Allergies as of 06/08/2023       Reactions   Aspirin Other (See Comments)   CONTRAINDICATED  DUE TO HISTORY OF BLEEDING IN BRAIN   Cheese    Cant take due to history of severe migraines   Chocolate    Cant take due to history of severe migraines   Coffee Flavoring Agent (non-screening)    Cant take due to history of severe migraines   Other     Nuts - Cant take due to history of severe migraines   Red Wine Complex [germanium]    Cant take due to history of severe migraines        Medication List        Accurate as of June 08, 2023 11:59 PM. If you have any questions, ask your nurse or doctor.          acetaminophen  325 MG tablet Commonly known as: TYLENOL  Take 650 mg by mouth every 4 (four) hours as needed for headache or mild pain.   baclofen  10 MG tablet Commonly known as: LIORESAL  Take 10 mg by mouth at bedtime.   butalbital -acetaminophen -caffeine  50-325-40 MG tablet Commonly known as: FIORICET Take 1 tablet once a day as needed for severe migraine. Do not take more than 3 a week.   Calcium  Carbonate 500 MG Chew Chew 500 mg by mouth in the morning and at bedtime.   fexofenadine 180 MG tablet Commonly known as: ALLEGRA Take 180 mg by mouth daily.   nystatin  powder Commonly known as: MYCOSTATIN /NYSTOP  Apply 1 application. topically 2 (two) times daily as needed (rash under breast).   OMEGA-3 FISH OIL PO Take 1 capsule by mouth daily.   omeprazole 20 MG capsule Commonly known as: PRILOSEC Take 20 mg by mouth daily.   ondansetron  4 MG tablet Commonly known as: ZOFRAN  Take 4 mg by mouth every 6 (six) hours as needed for nausea or vomiting.   polyethylene glycol 17 g packet Commonly known as: MIRALAX  / GLYCOLAX  Take 17 g by mouth daily as needed for mild constipation.   rosuvastatin  10 MG  tablet Commonly known as: CRESTOR  Take 1 tablet (10 mg total) by mouth daily.   Systane Balance 0.6 % Soln Generic drug: Propylene Glycol Place 1 drop into both eyes 2 (two) times daily.   Therems-M Tabs Take 1 tablet by mouth daily.   tiZANidine  2 MG tablet Commonly known as: ZANAFLEX  Take 1 tablet as needed once a day for headache (Give Tizanidine  instead of Tylenol )   topiramate  100 MG tablet Commonly known as: TOPAMAX  Take 300 mg by mouth 2 (two) times daily.   vitamin D3 25 MCG tablet Commonly known as: CHOLECALCIFEROL  Take 1,000 Units by mouth daily.   zinc oxide 20 % ointment Apply 1 application. topically as needed (after every incontinent episode).        Review of Systems  Constitutional:  Negative for activity change, fatigue and fever.  HENT:  Positive for congestion, rhinorrhea, sore throat and trouble swallowing.   Eyes:  Negative for visual disturbance.       Lateral right peripheral vision lost  Respiratory:  Positive for cough. Negative for shortness of breath and wheezing.   Cardiovascular:  Positive for leg swelling.  Gastrointestinal:  Negative for abdominal pain and constipation.  Genitourinary:  Positive for frequency. Negative for dysuria and urgency.       Chronic  Musculoskeletal:  Positive for arthralgias and gait problem.       R hip pain, better. Right ankle brace. R wrist contracture.   Skin:  Negative for color change.  Neurological:  Positive for speech difficulty and  weakness. Negative for seizures and headaches.       Difficulty words finding, memory lapses.   Psychiatric/Behavioral:  Negative for behavioral problems and sleep disturbance. The patient is not nervous/anxious.     Immunization History  Administered Date(s) Administered   Influenza Inj Mdck Quad Pf 02/16/2017   Influenza Inj Mdck Quad With Preservative 02/16/2018, 03/01/2019   Influenza, High Dose Seasonal PF 02/02/2023   Influenza,inj,Quad PF,6+ Mos 03/01/2017    Influenza,inj,quad, With Preservative 02/18/2015   Influenza-Unspecified 03/01/2017, 12/17/2019, 02/24/2021, 02/22/2022   Moderna Covid-19 Vaccine  Bivalent Booster 61yrs & up 09/18/2021, 02/16/2023   PFIZER(Purple Top)SARS-COV-2 Vaccination 07/22/2019, 08/12/2019, 03/10/2020, 11/10/2020, 01/20/2021   PPD Test 07/17/2021, 07/30/2021   Pfizer Covid-19 Vaccine Bivalent Booster 71yrs & up 03/04/2022   Pneumococcal Conjugate-13 04/18/2016   Pneumococcal Polysaccharide-23 05/03/2004   Pneumococcal-Unspecified 05/03/2004   Respiratory Syncytial Virus Vaccine,Recomb Aduvanted(Arexvy) 04/30/2022   Td 10/19/2019   Td (Adult),unspecified 10/19/2019   Tdap 08/11/2009   Zoster, Live 04/14/2012   Pertinent  Health Maintenance Due  Topic Date Due   Colonoscopy  01/09/2024   MAMMOGRAM  11/28/2024   INFLUENZA VACCINE  Completed   DEXA SCAN  Completed      07/29/2021    1:08 PM 02/26/2022    1:30 PM 07/14/2022    9:17 AM 07/27/2022   12:32 PM 02/16/2023    9:40 AM  Fall Risk  Falls in the past year? 1 0 0 0 0  Was there an injury with Fall? 1 0 0 0 0  Fall Risk Category Calculator 2 0 0 0 0  Fall Risk Category (Retired) Moderate Low     (RETIRED) Patient Fall Risk Level Moderate fall risk High fall risk     Patient at Risk for Falls Due to   History of fall(s) History of fall(s)   Fall risk Follow up  Falls evaluation completed Falls evaluation completed Falls evaluation completed Falls evaluation completed   Functional Status Survey:    Vitals:   06/08/23 1200 06/09/23 1051  BP: (!) 161/77 (!) 142/70  Pulse: 90   Resp: 20   Temp: 98.2 F (36.8 C)   SpO2: 96%   Weight: 167 lb (75.8 kg)    Body mass index is 27.79 kg/m. Physical Exam Vitals and nursing note reviewed.  Constitutional:      Appearance: Normal appearance.  HENT:     Head: Normocephalic and atraumatic.     Nose: Congestion and rhinorrhea present.     Mouth/Throat:     Mouth: Mucous membranes are moist.      Pharynx: Posterior oropharyngeal erythema present.  Eyes:     Extraocular Movements: Extraocular movements intact.     Pupils: Pupils are equal, round, and reactive to light.  Cardiovascular:     Rate and Rhythm: Normal rate and regular rhythm.     Heart sounds: No murmur heard. Pulmonary:     Effort: Pulmonary effort is normal.     Breath sounds: No rales.  Abdominal:     General: Bowel sounds are normal.     Palpations: Abdomen is soft.     Tenderness: There is no abdominal tenderness.  Musculoskeletal:        General: Deformity present.     Cervical back: Normal range of motion and neck supple.     Right lower leg: Edema present.     Left lower leg: Edema present.     Comments: Right wrist contracture, right ankle brace. Trace edema BLE  Skin:  General: Skin is warm and dry.  Neurological:     General: No focal deficit present.     Mental Status: She is alert and oriented to person, place, and time. Mental status is at baseline.     Cranial Nerves: No cranial nerve deficit.     Motor: Weakness present.     Coordination: Coordination abnormal.     Gait: Gait abnormal.     Comments: Right wrist contracture, muscle strength 1/5 RUE, 4-5 RLE. Uses R ankle brace.  Psychiatric:        Mood and Affect: Mood normal.        Behavior: Behavior normal.        Thought Content: Thought content normal.     Labs reviewed: No results for input(s): NA, K, CL, CO2, GLUCOSE, BUN, CREATININE, CALCIUM , MG, PHOS in the last 8760 hours. Recent Labs    07/01/22 1104  AST 16  ALT 17  ALKPHOS 173*  BILITOT <0.2  PROT 6.7  ALBUMIN 4.0   No results for input(s): WBC, NEUTROABS, HGB, HCT, MCV, PLT in the last 8760 hours. Lab Results  Component Value Date   TSH 3.039 07/15/2021   Lab Results  Component Value Date   HGBA1C 4.8 12/19/2020   Lab Results  Component Value Date   CHOL 158 07/09/2021   HDL 58 07/09/2021   LDLCALC 85 07/09/2021    LDLDIRECT 66 07/01/2022   TRIG 74 07/09/2021   CHOLHDL 2.7 07/09/2021    Significant Diagnostic Results in last 30 days:  No results found.  Assessment/Plan: Influenza A c/o scratchy throat, cough with clear sputum, denied generalized weakness, aches, or pain, no O2 desaturation, she is afebrile. Tested positive Flu A.  Temiflu 30mg  bid x 5 days Tessalon 100mg  tid and VS q shift for 3 days   Slow transit constipation  stable, takes Senokot, MiraLax   Coronary artery calcification no angina. F/u Cardiology.   Impaired cognition difficulty word findings/forming sentences. 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 MMSE 30/30 08/19/22  Migraine no flare ups since 2017, Tylenol , Zanaflex , treated with steroids in the past.   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 takes Baclofen  nightly.              Hx of cerebral AVMs  HTN (hypertension) Bp 142/70, will try Lisinopril  5mg  every day    Family/ staff Communication: plan of care reviewed with the patient and charge nurse.  Labs/tests ordered:  none

## 2023-06-08 NOTE — Assessment & Plan Note (Signed)
 no angina. F/u Cardiology.

## 2023-06-08 NOTE — Assessment & Plan Note (Signed)
 c/o scratchy throat, cough with clear sputum, denied generalized weakness, aches, or pain, no O2 desaturation, she is afebrile. Tested positive Flu A.  Temiflu 30mg  bid x 5 days Tessalon 100mg  tid and VS q shift for 3 days

## 2023-06-08 NOTE — Assessment & Plan Note (Signed)
 difficulty word findings/forming sentences. 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 MMSE 30/30 08/19/22

## 2023-06-08 NOTE — Assessment & Plan Note (Signed)
no flare ups since 2017, Tylenol, Zanaflex, treated with steroids in the past.  

## 2023-06-08 NOTE — Assessment & Plan Note (Signed)
stable, takes Senokot, MiraLax ?

## 2023-06-08 NOTE — Assessment & Plan Note (Signed)
 R hemiparesis, lateral right peripheral vision deficit, right wrist contracture, as an result of a hemorrhagic stroke at age of 18 and subsequent strokes x5 per patient. takes Baclofen  nightly.              Hx of cerebral AVMs

## 2023-06-08 NOTE — Assessment & Plan Note (Signed)
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

## 2023-06-09 DIAGNOSIS — I10 Essential (primary) hypertension: Secondary | ICD-10-CM | POA: Insufficient documentation

## 2023-06-09 NOTE — Assessment & Plan Note (Signed)
 Bp 142/70, will try Lisinopril 5mg  every day

## 2023-06-16 ENCOUNTER — Non-Acute Institutional Stay: Payer: Medicare Other | Admitting: Sports Medicine

## 2023-06-16 DIAGNOSIS — G8191 Hemiplegia, unspecified affecting right dominant side: Secondary | ICD-10-CM

## 2023-06-16 DIAGNOSIS — R748 Abnormal levels of other serum enzymes: Secondary | ICD-10-CM

## 2023-06-16 DIAGNOSIS — G40909 Epilepsy, unspecified, not intractable, without status epilepticus: Secondary | ICD-10-CM

## 2023-06-16 DIAGNOSIS — I10 Essential (primary) hypertension: Secondary | ICD-10-CM

## 2023-06-16 DIAGNOSIS — J101 Influenza due to other identified influenza virus with other respiratory manifestations: Secondary | ICD-10-CM

## 2023-06-16 DIAGNOSIS — G43909 Migraine, unspecified, not intractable, without status migrainosus: Secondary | ICD-10-CM | POA: Diagnosis not present

## 2023-06-16 NOTE — Progress Notes (Signed)
Provider:  Dr. Venita Sheffield Location:  [facility] Place of Service:   Assisted living   PCP: Mast, Man X, NP Patient Care Team: Mast, Man X, NP as PCP - General (Internal Medicine) Christell Constant, MD as PCP - Cardiology (Cardiology) Van Clines, MD as Consulting Physician (Neurology) Annie Sable, MD as Consulting Physician (Nephrology) Heloise Purpura, MD as Consulting Physician (Urology)  Extended Emergency Contact Information Primary Emergency Contact: Debbrah Alar [place] Home Phone: [phone] Mobile Phone: [phone] Relation: Relative Secondary Emergency Contact: Roper,Paula Address: [address]          [place], [place] [address] [place] Home Phone: [phone] Mobile Phone: [phone] Relation: Sister  Goals of Care: Advanced Directive information    04/20/2023   10:51 AM  Advanced Directives  Does Patient Have a Medical Advance Directive? Yes  Type of Estate agent of [place];Living will;Out of facility DNR (pink MOST or yellow form)  Does patient want to make changes to medical advance directive? No - Patient declined  Copy of Healthcare Power of Attorney in Chart? Yes - validated most recent copy scanned in chart (See row information)  Pre-existing out of facility DNR order (yellow form or pink MOST form) Pink MOST form placed in chart (order not valid for inpatient use);Yellow form placed in chart (order not valid for inpatient use)           History of Present Illness         68 yr old F  is seen today for chronic disease management  Pt seen and examined in her room , she is sitting in her recliner chair watching her Ipad Pt was tested positive for flu last week. Pt denies fevers, chills, runny nose C/o intermittent cough which is improved Denies sob, bodyaches Reports good appetite Denies abdominal pain, nausea, vomiting, dysuria, hematuria,  bloody or dark stools  H/O Hemorrhagic stroke H/o Migraine headache H/o Seizre Pt followed with neurology Currently on topomax Ambulates with walker Denies headache, nausea, vomiting, blurring or double vision.    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 Berdine Dance   BRONCHIAL BIOPSY  04/21/2021   Procedure: BRONCHIAL BIOPSIES;  Surgeon: Josephine Igo, DO;  Location: [facility];  Service: Pulmonary;;   BRONCHIAL BRUSHINGS  04/21/2021   Procedure: BRONCHIAL BRUSHINGS;  Surgeon: Josephine Igo, DO;  Location: [facility];  Service: Pulmonary;;   BRONCHIAL WASHINGS  04/21/2021   Procedure: BRONCHIAL WASHINGS;  Surgeon: Josephine Igo, DO;  Location: [facility];  Service: Pulmonary;;   COLONOSCOPY  2017   [place], [place]   CYSTOSCOPY/URETEROSCOPY/HOLMIUM  LASER/STENT PLACEMENT Left 05/10/2019   Procedure: CYSTOSCOPY/RETROGRADE/URETEROSCOPY/HOLMIUM LASER/STENT PLACEMENT;  Surgeon: Heloise Purpura, MD;  Location: [facility];  Service: Urology;  Laterality: Left;   FIDUCIAL MARKER PLACEMENT  04/21/2021   Procedure: FIDUCIAL MARKER PLACEMENT;  Surgeon: Josephine Igo, DO;  Location: [facility];  Service: Pulmonary;;   FOOT SURGERY Right 10/1985   ankle fusion, at  [facility], Dr. Nyra Capes   INTRAMEDULLARY (IM) NAIL INTERTROCHANTERIC Right 07/09/2021   Procedure: INTRAMEDULLARY (IM) NAIL INTERTROCHANTRIC;  Surgeon: Samson Frederic, MD;  Location: [facility];  Service: Orthopedics;  Laterality: Right;   POLYPECTOMY  2017   multiple adenomas   TUBAL LIGATION  1981   VIDEO BRONCHOSCOPY WITH RADIAL ENDOBRONCHIAL ULTRASOUND  04/21/2021   Procedure: RADIAL ENDOBRONCHIAL ULTRASOUND;  Surgeon: Josephine Igo, DO;  Location: [facility];  Service: Pulmonary;;   WISDOM TOOTH EXTRACTION      reports that she has never smoked. She has been exposed to tobacco smoke. She has never used smokeless tobacco. She reports that she does not drink alcohol and does not use drugs. Social History   Socioeconomic History   Marital status: Single    Spouse name: Not on file   Number of children: Not on file   Years of education: Not on file   Highest education level: Not on file  Occupational History   Not on file  Tobacco Use   Smoking status: Never    Passive exposure: Past   Smokeless tobacco: Never  Vaping Use   Vaping status: Never Used  Substance and Sexual Activity   Alcohol use: No   Drug use: No   Sexual activity: Not Currently    Birth control/protection: None    Comment: 1st intercourse 68 yo-Fewer than 5 partners  Other Topics Concern   Not on file  Social History Narrative   Right handed until 3rd grade    Left handed   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    Social Drivers of Health   Financial Resource Strain: Not on file  Food Insecurity: Not on file  Transportation Needs: Not on file  Physical Activity: Inactive (08/22/2018)   Exercise Vital Sign    Days of Exercise per Week: 0 days    Minutes of Exercise per Session: 0 min  Stress: No Stress Concern Present (08/22/2018)   [employer] of Occupational Health - Occupational Stress Questionnaire    Feeling of Stress : Only a little  Social Connections: Not on file  Intimate Partner Violence:  Not on file    Functional Status Survey:    Family History  Problem Relation Age of Onset   Heart disease Mother    Heart failure Mother    Heart attack Mother    Diabetes Father    Hyperlipidemia Father    Dementia Father    Prostate cancer Father    Melanoma Father    Colon polyps Father    Hyperlipidemia Paternal Uncle    Hypertension Paternal Uncle    Dementia Paternal Uncle    Heart attack Maternal Grandfather    Stroke Paternal Grandmother    CAD Maternal Uncle    COPD Maternal Uncle        smoker   Breast cancer Neg Hx    Colon cancer Neg Hx    Esophageal cancer Neg Hx    Stomach cancer Neg Hx    Rectal cancer Neg Hx     Health Maintenance  Topic Date Due   Zoster Vaccines- Shingrix (1 of  2) 01/30/1977   Pneumonia Vaccine 68+ Years old (3 of 3 - PPSV23 or PCV20) 06/13/2016   Cervical Cancer Screening (HPV/Pap Cotest)  12/06/2020   COVID-19 Vaccine (9 - 2024-25 season) 04/13/2023   Medicare Annual Wellness (AWV)  08/18/2023   Colonoscopy  01/09/2024   MAMMOGRAM  11/28/2024   DTaP/Tdap/Td (4 - Td or Tdap) 10/18/2029   INFLUENZA VACCINE  Completed   DEXA SCAN  Completed   HPV VACCINES  Aged Out   Hepatitis C Screening  Discontinued   HIV Screening  Discontinued    Allergies  Allergen Reactions   Aspirin Other (See Comments)    CONTRAINDICATED DUE TO HISTORY OF BLEEDING IN BRAIN   Cheese     Cant take due to history of severe migraines   Chocolate     Cant take due to history of severe migraines   Coffee Flavoring Agent (Non-Screening)     Cant take due to history of severe migraines   Other      Nuts - Cant take due to history of severe migraines   Red Wine Complex [Germanium]     Cant take due to history of severe migraines    Outpatient Encounter Medications as of 06/16/2023  Medication Sig   acetaminophen (TYLENOL) 325 MG tablet Take 650 mg by mouth every 4 (four) hours as needed for headache or mild pain.   baclofen (LIORESAL) 10 MG tablet  Take 10 mg by mouth at bedtime.   butalbital-acetaminophen-caffeine (FIORICET) 50-325-40 MG tablet Take 1 tablet once a day as needed for severe migraine. Do not take more than 3 a week.   Calcium Carbonate 500 MG CHEW Chew 500 mg by mouth in the morning and at bedtime.   fexofenadine (ALLEGRA) 180 MG tablet Take 180 mg by mouth daily. (Patient not taking: Reported on 03/07/2023)   Multiple Vitamins-Minerals (THEREMS-M) TABS Take 1 tablet by mouth daily.   nystatin (MYCOSTATIN/NYSTOP) powder Apply 1 application. topically 2 (two) times daily as needed (rash under breast).   Omega-3 Fatty Acids (OMEGA-3 FISH OIL PO) Take 1 capsule by mouth daily.   omeprazole (PRILOSEC) 20 MG capsule Take 20 mg by mouth daily.   ondansetron (ZOFRAN) 4 MG tablet Take 4 mg by mouth every 6 (six) hours as needed for nausea or vomiting.   polyethylene glycol (MIRALAX / GLYCOLAX) 17 g packet Take 17 g by mouth daily as needed for mild constipation.   Propylene Glycol (SYSTANE BALANCE) 0.6 % SOLN Place 1 drop into both eyes 2 (two) times daily.   rosuvastatin (CRESTOR) 10 MG tablet Take 1 tablet (10 mg total) by mouth daily.   tiZANidine (ZANAFLEX) 2 MG tablet Take 1 tablet as needed once a day for headache (Give Tizanidine instead of Tylenol)   topiramate (TOPAMAX) 100 MG tablet Take 300 mg by mouth 2 (two) times daily.   Vitamin D3 (VITAMIN D) 25 MCG tablet Take 1,000 Units by mouth daily.   zinc oxide 20 % ointment Apply 1 application. topically as needed (after every incontinent episode).   No facility-administered encounter medications on file as of 06/16/2023.    Review of Systems  Constitutional:  Negative for chills and fever.  HENT:  Negative for sinus pressure and sore throat.   Respiratory:  Positive for cough. Negative for shortness of breath and wheezing.   Cardiovascular:  Negative for chest pain, palpitations and leg swelling.  Gastrointestinal:  Negative for abdominal distention, abdominal pain, blood  in stool, constipation, diarrhea, nausea and vomiting.  Genitourinary:  Negative for dysuria, frequency and urgency.  Neurological:  Negative for dizziness and numbness.  Psychiatric/Behavioral:  Negative for confusion.    Negative unless indicated in HPI.  There were no vitals filed for this visit. There is no height or weight on file to calculate BMI. BP Readings from Last 3 Encounters:  06/09/23 (!) 142/70  04/20/23 (!) 143/83  03/07/23 (!) 141/87   Wt Readings from Last 3 Encounters:  06/08/23 167 lb (75.8 kg)  04/20/23 165 lb 6.4 oz (75 kg)  03/07/23 164 lb 12.8 oz (74.8 kg)   Physical Exam Constitutional:      Appearance: Normal appearance.  HENT:     Head: Normocephalic and atraumatic.  Cardiovascular:     Rate and Rhythm: Normal rate and regular rhythm.  Pulmonary:     Effort: Pulmonary effort is normal. No respiratory distress.     Breath sounds: Normal breath sounds. No wheezing.  Abdominal:     General: Bowel sounds are normal. There is no distension.     Tenderness: There is no abdominal tenderness. There is no guarding or rebound.     Comments:    Musculoskeletal:        General: No swelling.  Neurological:     Mental Status: She is alert. Mental status is at baseline.     Comments: Rt wrist contracture  Residual weakness on Rt side from previous stroke     Labs reviewed: Basic Metabolic Panel: No results for input(s): "NA", "K", "CL", "CO2", "GLUCOSE", "BUN", "CREATININE", "CALCIUM", "MG", "PHOS" in the last 8760 hours. Liver Function Tests: Recent Labs    07/01/22 1104  AST 16  ALT 17  ALKPHOS 173*  BILITOT <0.2  PROT 6.7  ALBUMIN 4.0   No results for input(s): "LIPASE", "AMYLASE" in the last 8760 hours. No results for input(s): "AMMONIA" in the last 8760 hours. CBC: No results for input(s): "WBC", "NEUTROABS", "HGB", "HCT", "MCV", "PLT" in the last 8760 hours. Cardiac Enzymes: No results for input(s): "CKTOTAL", "CKMB", "CKMBINDEX",  "TROPONINI" in the last 8760 hours. BNP: Invalid input(s): "POCBNP" Lab Results  Component Value Date   HGBA1C 4.8 12/19/2020   Lab Results  Component Value Date   TSH 3.039 07/15/2021   Lab Results  Component Value Date   VITAMINB12 391 08/07/2021   Lab Results  Component Value Date   FOLATE 45.2 07/09/2021   Lab Results  Component Value Date   IRON 93 08/07/2021   TIBC 219 08/07/2021   FERRITIN 170 08/07/2021    Imaging and Procedures obtained prior to SNF admission: MM 3D SCREENING MAMMOGRAM BILATERAL BREAST Result Date: 12/01/2022 CLINICAL DATA:  Screening. EXAM: DIGITAL SCREENING BILATERAL MAMMOGRAM WITH TOMOSYNTHESIS AND CAD TECHNIQUE: Bilateral screening digital craniocaudal and mediolateral oblique mammograms were obtained. Bilateral screening digital breast tomosynthesis was performed. The images were evaluated with computer-aided detection. COMPARISON:  Previous exam(s). ACR Breast Density Category b: There are scattered areas of fibroglandular density. FINDINGS: There are no findings suspicious for malignancy. IMPRESSION: No mammographic evidence of malignancy. A result letter of this screening mammogram will be mailed directly to the patient. RECOMMENDATION: Screening mammogram in one year. (Code:SM-B-01Y) BI-RADS CATEGORY  1: Negative. Electronically Signed   By: Sande Brothers M.D.   On: 12/01/2022 08:51    Assessment and Plan      1. Influenza A (Primary) Pt doing better Cont with tamiflu On droplet isolation  Monitor vitals   2. Seizure disorder (HCC) Cont with topmax  3. Migraine without status  migrainosus, not intractable, unspecified migraine type No recent flare ups Followed with neurology recently  Cont with topomax  4. Right hemiparesis (HCC) Cont with crestor   5. Primary hypertension Cont with lisinopril  6. Alkaline phosphatase elevation Will repeat cmp       30 min Total time spent for obtaining history,  performing a medically  appropriate examination and evaluation, reviewing the tests,ordering  tests,  documenting clinical information in the electronic or other health record ,care coordination (not separately reported)

## 2023-06-20 ENCOUNTER — Non-Acute Institutional Stay: Payer: Medicare Other | Admitting: Nurse Practitioner

## 2023-06-20 ENCOUNTER — Encounter: Payer: Self-pay | Admitting: Sports Medicine

## 2023-06-20 ENCOUNTER — Encounter: Payer: Self-pay | Admitting: Nurse Practitioner

## 2023-06-20 DIAGNOSIS — K2900 Acute gastritis without bleeding: Secondary | ICD-10-CM | POA: Diagnosis not present

## 2023-06-20 DIAGNOSIS — I10 Essential (primary) hypertension: Secondary | ICD-10-CM

## 2023-06-20 DIAGNOSIS — G40909 Epilepsy, unspecified, not intractable, without status epilepticus: Secondary | ICD-10-CM | POA: Diagnosis not present

## 2023-06-20 DIAGNOSIS — K2970 Gastritis, unspecified, without bleeding: Secondary | ICD-10-CM | POA: Insufficient documentation

## 2023-06-20 NOTE — Assessment & Plan Note (Signed)
Blood pressure is controlled, on Lisinopril 5mg  every day.

## 2023-06-20 NOTE — Assessment & Plan Note (Signed)
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

## 2023-06-20 NOTE — Progress Notes (Signed)
Location:   [facility] Room Number: 819 Place of Service:  ALF (13) Provider: Arna Snipe Kathryne Ramella NP  Robbin Escher X, NP  Patient Care Team: Takeira Yanes X, NP as PCP - General (Internal Medicine) Christell Constant, MD as PCP - Cardiology (Cardiology) Van Clines, MD as Consulting Physician (Neurology) Annie Sable, MD as Consulting Physician (Nephrology) Heloise Purpura, MD as Consulting Physician (Urology)  Extended Emergency Contact Information Primary Emergency Contact: Debbrah Alar [place] of [place] Home Phone: [phone] Mobile Phone: [phone] Relation: Relative Secondary Emergency Contact: Roper,Paula Address: [address]          [place], [place] [address] D[place] of [place] Home Phone: [phone] Mobile Phone: [phone] Relation: Sister  Code Status: DNR Goals of care: Advanced Directive information    04/20/2023   10:51 AM  Advanced Directives  Does Patient Have a Medical Advance Directive? Yes  Type of Estate agent of [place];Living will;Out of facility DNR (pink MOST or yellow form)  Does patient want to make changes to medical advance directive? No - Patient declined  Copy of Healthcare Power of Attorney in Chart? Yes - validated most recent copy scanned in chart (See row information)  Pre-existing out of facility DNR order (yellow form or pink MOST form) Pink MOST form placed in chart (order not valid for inpatient use);Yellow form placed in chart (order not valid for inpatient use)     Chief Complaint  Patient presents with   Acute Visit    Nausea, vomiting, diarrhea    HPI:  Pt is a 68 y.o. female seen today for an acute visit for nausea, vomiting, diarrhea x1 day, not responding to Zofran, Imodium well, abd discomfort, denied dysuria, SOB, cough, sore throat, she is afebrile.   Flu A onset 06/08/23, resolved respiratory symptoms, treated with TamiFlu.    Elevated alk phos 173 2/29/42,  161 01/04/23 Constipation, stable, prn MiraLax             R hip pain, on Tylenol for pain, R hip ORIF 07/09/21             Dysphagia 2, thin liquid.              GERD, stable             Hypokalemia, K 3.5 01/04/23             CAD, no angina. F/u Cardiology.              HTN, controlled, on Lisinopril 5mg  every day.              HLD LDL 74 01/04/23, Cardiology Rosuvastatin             Impaired cognition, difficulty word findings/forming sentences. 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 MMSE 30/30 08/19/22 Migraines, no flare ups since 2017, Tylenol, Zanaflex, treated with steroids in the past.              Pulmonary nodules, f/u Pulmonology, R upper lobe nodule, had bronchoscopy 04/21/21-negative for malignancy or infection.              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 takes Baclofen nightly.              Hx of cerebral AVMs             CKD III, Bun/creat 28/1.14 01/03/33             Gait abnormality, patient ambulated with walker, R ankle brace.              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 Berdine Dance   BRONCHIAL BIOPSY  04/21/2021   Procedure: BRONCHIAL BIOPSIES;  Surgeon: Josephine Igo, DO;  [facility];  Service: Pulmonary;;   BRONCHIAL BRUSHINGS  04/21/2021   Procedure: BRONCHIAL BRUSHINGS;  Surgeon: Josephine Igo, DO;  [facility];  Service: Pulmonary;;   BRONCHIAL WASHINGS  04/21/2021   Procedure: BRONCHIAL WASHINGS;  Surgeon: Josephine Igo, DO;  [facility];  Service: Pulmonary;;   COLONOSCOPY  2017   [place]   CYSTOSCOPY/URETEROSCOPY/HOLMIUM LASER/STENT PLACEMENT Left 05/10/2019   Procedure: CYSTOSCOPY/RETROGRADE/URETEROSCOPY/HOLMIUM LASER/STENT PLACEMENT;  Surgeon: Heloise Purpura, MD;  [facility];  Service: Urology;  Laterality: Left;   FIDUCIAL MARKER PLACEMENT  04/21/2021   Procedure: FIDUCIAL MARKER PLACEMENT;  Surgeon: Josephine Igo, DO;  [facility];  Service: Pulmonary;;   FOOT SURGERY Right 10/1985   ankle fusion, at [facility], Dr. Nyra Capes   INTRAMEDULLARY (IM) NAIL INTERTROCHANTERIC Right 07/09/2021   Procedure: INTRAMEDULLARY (IM) NAIL INTERTROCHANTRIC;  Surgeon: Samson Frederic, MD;  [facility];  Service: Orthopedics;  Laterality: Right;   POLYPECTOMY  2017   multiple adenomas   TUBAL LIGATION  1981   VIDEO BRONCHOSCOPY WITH RADIAL ENDOBRONCHIAL ULTRASOUND  04/21/2021   Procedure: RADIAL ENDOBRONCHIAL ULTRASOUND;  Surgeon: Josephine Igo, DO;  [facility];  Service: Pulmonary;;   WISDOM TOOTH EXTRACTION      Allergies  Allergen Reactions   Aspirin Other (See Comments)    CONTRAINDICATED DUE TO HISTORY OF BLEEDING IN BRAIN   Cheese     Cant take due to history of severe migraines   Chocolate     Cant take due to history of severe migraines   Coffee Flavoring Agent (Non-Screening)     Cant  take due to history of severe migraines   Other      Nuts - Cant take due to history of severe migraines   Red Wine Complex [Germanium]     Cant take due to history of severe migraines    Allergies as of 06/20/2023       Reactions   Aspirin Other (  See Comments)   CONTRAINDICATED DUE TO HISTORY OF BLEEDING IN BRAIN   Cheese    Cant take due to history of severe migraines   Chocolate    Cant take due to history of severe migraines   Coffee Flavoring Agent (non-screening)    Cant take due to history of severe migraines   Other     Nuts - Cant take due to history of severe migraines   Red Wine Complex [germanium]    Cant take due to history of severe migraines        Medication List        Accurate as of June 20, 2023  4:48 PM. If you have any questions, ask your nurse or doctor.          acetaminophen 325 MG tablet Commonly known as: TYLENOL Take 650 mg by mouth every 4 (four) hours as needed for headache or mild pain.   baclofen 10 MG tablet Commonly known as: LIORESAL Take 10 mg by mouth at bedtime.   butalbital-acetaminophen-caffeine 50-325-40 MG tablet Commonly known as: FIORICET Take 1 tablet once a day as needed for severe migraine. Do not take more than 3 a week.   Calcium Carbonate 500 MG Chew Chew 500 mg by mouth in the morning and at bedtime.   fexofenadine 180 MG tablet Commonly known as: ALLEGRA Take 180 mg by mouth daily.   nystatin powder Commonly known as: MYCOSTATIN/NYSTOP Apply 1 application. topically 2 (two) times daily as needed (rash under breast).   OMEGA-3 FISH OIL PO Take 1 capsule by mouth daily.   omeprazole 20 MG capsule Commonly known as: PRILOSEC Take 20 mg by mouth daily.   ondansetron 4 MG tablet Commonly known as: ZOFRAN Take 4 mg by mouth every 6 (six) hours as needed for nausea or vomiting.   oseltamivir 30 MG capsule Commonly known as: TAMIFLU Take 30 mg by mouth 2 (two) times daily.   polyethylene glycol 17  g packet Commonly known as: MIRALAX / GLYCOLAX Take 17 g by mouth daily as needed for mild constipation.   rosuvastatin 10 MG tablet Commonly known as: CRESTOR Take 1 tablet (10 mg total) by mouth daily.   Systane Balance 0.6 % Soln Generic drug: Propylene Glycol Place 1 drop into both eyes 2 (two) times daily.   Therems-M Tabs Take 1 tablet by mouth daily.   tiZANidine 2 MG tablet Commonly known as: ZANAFLEX Take 1 tablet as needed once a day for headache (Give Tizanidine instead of Tylenol)   topiramate 100 MG tablet Commonly known as: TOPAMAX Take 300 mg by mouth 2 (two) times daily.   vitamin D3 25 MCG tablet Commonly known as: CHOLECALCIFEROL Take 1,000 Units by mouth daily.   zinc oxide 20 % ointment Apply 1 application. topically as needed (after every incontinent episode).        Review of Systems  Constitutional:  Negative for activity change, fatigue and fever.  HENT:  Positive for trouble swallowing. Negative for congestion, rhinorrhea and sore throat.   Eyes:  Negative for visual disturbance.       Lateral right peripheral vision lost  Respiratory:  Negative for cough, shortness of breath and wheezing.   Cardiovascular:  Positive for leg swelling.  Gastrointestinal:  Positive for abdominal pain, diarrhea, nausea and vomiting.  Genitourinary:  Positive for frequency. Negative for dysuria and urgency.       Chronic  Musculoskeletal:  Positive for arthralgias and gait problem.  R hip pain, better. Right ankle brace. R wrist contracture.   Skin:  Negative for color change.  Neurological:  Positive for speech difficulty and weakness. Negative for seizures and headaches.       Difficulty words finding, memory lapses.   Psychiatric/Behavioral:  Negative for behavioral problems and sleep disturbance. The patient is not nervous/anxious.     Immunization History  Administered Date(s) Administered   Influenza Inj Mdck Quad Pf 02/16/2017   Influenza Inj  Mdck Quad With Preservative 02/16/2018, 03/01/2019   Influenza, High Dose Seasonal PF 02/02/2023   Influenza,inj,Quad PF,6+ Mos 03/01/2017   Influenza,inj,quad, With Preservative 02/18/2015   Influenza-Unspecified 03/01/2017, 12/17/2019, 02/24/2021, 02/22/2022   Moderna Covid-19 Vaccine Bivalent Booster 39yrs & up 09/18/2021, 02/16/2023   PFIZER(Purple Top)SARS-COV-2 Vaccination 07/22/2019, 08/12/2019, 03/10/2020, 11/10/2020, 01/20/2021   PPD Test 07/17/2021, 07/30/2021   Pfizer Covid-19 Vaccine Bivalent Booster 22yrs & up 03/04/2022   Pneumococcal Conjugate-13 04/18/2016   Pneumococcal Polysaccharide-23 05/03/2004   Pneumococcal-Unspecified 05/03/2004   Respiratory Syncytial Virus Vaccine,Recomb Aduvanted(Arexvy) 04/30/2022   Td 10/19/2019   Td (Adult),unspecified 10/19/2019   Tdap 08/11/2009   Zoster, Live 04/14/2012   Pertinent  Health Maintenance Due  Topic Date Due   Colonoscopy  01/09/2024   MAMMOGRAM  11/28/2024   INFLUENZA VACCINE  Completed   DEXA SCAN  Completed      07/29/2021    1:08 PM 02/26/2022    1:30 PM 07/14/2022    9:17 AM 07/27/2022   12:32 PM 02/16/2023    9:40 AM  Fall Risk  Falls in the past year? 1 0 0 0 0  Was there an injury with Fall? 1 0 0 0 0  Fall Risk Category Calculator 2 0 0 0 0  Fall Risk Category (Retired) Moderate Low     (RETIRED) Patient Fall Risk Level Moderate fall risk High fall risk     Patient at Risk for Falls Due to   History of fall(s) History of fall(s)   Fall risk Follow up  Falls evaluation completed Falls evaluation completed Falls evaluation completed Falls evaluation completed   Functional Status Survey:    Vitals:   06/20/23 1638  BP: 118/70  Pulse: 76  Resp: 18  Temp: 97.6 F (36.4 C)  SpO2: 97%  Weight: 167 lb (75.8 kg)   Body mass index is 27.79 kg/m. Physical Exam Vitals and nursing note reviewed.  Constitutional:      Comments: fatigue  HENT:     Head: Normocephalic and atraumatic.     Nose: Nose  normal.     Mouth/Throat:     Mouth: Mucous membranes are moist.  Eyes:     Extraocular Movements: Extraocular movements intact.     Pupils: Pupils are equal, round, and reactive to light.  Cardiovascular:     Rate and Rhythm: Normal rate and regular rhythm.     Heart sounds: No murmur heard. Pulmonary:     Effort: Pulmonary effort is normal.     Breath sounds: No rales.  Abdominal:     General: Bowel sounds are normal.     Palpations: Abdomen is soft.     Tenderness: There is abdominal tenderness. There is no right CVA tenderness, left CVA tenderness, guarding or rebound.     Comments: discomfort  Musculoskeletal:        General: Deformity present.     Cervical back: Normal range of motion and neck supple.     Right lower leg: Edema present.     Left  lower leg: Edema present.     Comments: Right wrist contracture, right ankle brace. Trace edema BLE  Skin:    General: Skin is warm and dry.  Neurological:     General: No focal deficit present.     Mental Status: She is alert and oriented to person, place, and time. Mental status is at baseline.     Cranial Nerves: No cranial nerve deficit.     Motor: Weakness present.     Coordination: Coordination abnormal.     Gait: Gait abnormal.     Comments: Right wrist contracture, muscle strength 1/5 RUE, 4-5 RLE. Uses R ankle brace.  Psychiatric:        Mood and Affect: Mood normal.        Behavior: Behavior normal.        Thought Content: Thought content normal.     Labs reviewed: No results for input(s): "NA", "K", "CL", "CO2", "GLUCOSE", "BUN", "CREATININE", "CALCIUM", "MG", "PHOS" in the last 8760 hours. Recent Labs    07/01/22 1104  AST 16  ALT 17  ALKPHOS 173*  BILITOT <0.2  PROT 6.7  ALBUMIN 4.0   No results for input(s): "WBC", "NEUTROABS", "HGB", "HCT", "MCV", "PLT" in the last 8760 hours. Lab Results  Component Value Date   TSH 3.039 07/15/2021   Lab Results  Component Value Date   HGBA1C 4.8 12/19/2020    Lab Results  Component Value Date   CHOL 158 07/09/2021   HDL 58 07/09/2021   LDLCALC 85 07/09/2021   LDLDIRECT 66 07/01/2022   TRIG 74 07/09/2021   CHOLHDL 2.7 07/09/2021    Significant Diagnostic Results in last 30 days:  No results found.  Assessment/Plan: Gastritis nausea, vomiting, diarrhea x1 day, not responding to Zofran, Imodium well, abd discomfort, denied dysuria, SOB, cough, sore throat, she is afebrile.  Obtain CBC/diff, CMP/eGFR, lipase, Amylase Encourage oral fluid VS q 4 hrs x72hrs.   HTN (hypertension) Blood pressure is controlled, on Lisinopril 5mg  every day.     Family/ staff Communication: plan of care reviewed with the patient and charge nurse.   Labs/tests ordered:  CBC/diff, CMP/eGFR, lipase, Amylase

## 2023-06-20 NOTE — Assessment & Plan Note (Signed)
nausea, vomiting, diarrhea x1 day, not responding to Zofran, Imodium well, abd discomfort, denied dysuria, SOB, cough, sore throat, she is afebrile.  Obtain CBC/diff, CMP/eGFR, lipase, Amylase Encourage oral fluid VS q 4 hrs x72hrs.

## 2023-06-21 LAB — LIPID PANEL
Cholesterol: 146 (ref 0–200)
HDL: 46 (ref 35–70)
LDL Cholesterol: 76
LDl/HDL Ratio: 3.2
Triglycerides: 142 (ref 40–160)

## 2023-06-21 LAB — BASIC METABOLIC PANEL WITH GFR
BUN: 32 — AB (ref 4–21)
CO2: 23 — AB (ref 13–22)
Chloride: 110 — AB (ref 99–108)
Glucose: 96
Potassium: 4.2 meq/L (ref 3.5–5.1)
Sodium: 139 (ref 137–147)

## 2023-06-21 LAB — HEPATIC FUNCTION PANEL
ALT: 14 U/L (ref 7–35)
AST: 13 (ref 13–35)
Alkaline Phosphatase: 128 — AB (ref 25–125)
Bilirubin, Total: 0.5

## 2023-06-21 LAB — CBC: RBC: 4.58 (ref 3.87–5.11)

## 2023-06-21 LAB — COMPREHENSIVE METABOLIC PANEL WITH GFR
Albumin: 4.1 (ref 3.5–5.0)
Calcium: 8.8 (ref 8.7–10.7)
Globulin: 2.7
eGFR: 48

## 2023-06-21 LAB — CBC AND DIFFERENTIAL
HCT: 43 (ref 36–46)
Hemoglobin: 14 (ref 12.0–16.0)
Neutrophils Absolute: 3534
Platelets: 271 10*3/uL (ref 150–400)
WBC: 5.6

## 2023-06-21 LAB — HEMOGLOBIN A1C: Hemoglobin A1C: 5.7

## 2023-06-21 LAB — AMYLASE: Amylase.: 57

## 2023-06-21 LAB — VITAMIN D 25 HYDROXY (VIT D DEFICIENCY, FRACTURES): Vit D, 25-Hydroxy: 47

## 2023-06-21 LAB — LIPASE: Lipase: 25

## 2023-06-21 LAB — VITAMIN B12: Vitamin B-12: 418

## 2023-07-18 ENCOUNTER — Encounter: Payer: Self-pay | Admitting: Pulmonary Disease

## 2023-07-18 ENCOUNTER — Ambulatory Visit: Payer: PRIVATE HEALTH INSURANCE | Admitting: Pulmonary Disease

## 2023-07-18 VITALS — BP 115/75 | HR 75 | Ht 65.0 in | Wt 165.0 lb

## 2023-07-18 DIAGNOSIS — R911 Solitary pulmonary nodule: Secondary | ICD-10-CM

## 2023-07-18 DIAGNOSIS — R9389 Abnormal findings on diagnostic imaging of other specified body structures: Secondary | ICD-10-CM

## 2023-07-18 NOTE — Patient Instructions (Addendum)
 Scan of the chest in June 2025-to follow-up on lung nodule  Follow-up appointment in 6 months  Call us with significant concerns

## 2023-07-18 NOTE — Progress Notes (Signed)
 Tonya Thompson    [id number]    11-27-1955  Primary Care Physician:Mast, Man X, NP  Referring Physician: Mast, Man X, NP [address] [place],  [place] [address]  Chief complaint:   Patient being seen for lung nodule  HPI:  Lung nodule was evaluated by bronchoscopy with negative findings Follow-up CT has been stable in 2024  Had a CT scan of the chest performed 10/15/2022 showing a right upper lobe nodule stable from CT a year prior  Patient denies any respiratory complaints today, no cough, no chest pain or chest discomfort, no unusual shortness of breath  She does have a history of coronary artery disease hypertension history of GERD, dysphagia, seizure disorder right hemiparesis stage IIIb chronic kidney disease, history of hemorrhagic CVA at age 46 and multiple strokes  Past history of COVID-19 infection  Limited with activities, uses a rollator to get around Resides in assisted living  She never smoked herself but was exposed to secondhand smoke growing up  Pets: Occupation: Exposures: Smoking history: Travel history: Relevant family history:  Outpatient Encounter Medications as of 07/18/2023  Medication Sig   acetaminophen (TYLENOL) 325 MG tablet Take 650 mg by mouth every 4 (four) hours as needed for headache or mild pain.   baclofen (LIORESAL) 10 MG tablet Take 10 mg by mouth at bedtime.   butalbital-acetaminophen-caffeine (FIORICET) 50-325-40 MG tablet Take 1 tablet once a day as needed for severe migraine. Do not take more than 3 a week.   Calcium Carbonate 500 MG CHEW Chew 500 mg by mouth in the morning and at bedtime.   fexofenadine (ALLEGRA) 180 MG tablet Take 180 mg by mouth daily.   Multiple Vitamins-Minerals (THEREMS-M) TABS Take 1 tablet by mouth daily.   nystatin (MYCOSTATIN/NYSTOP) powder Apply 1 application. topically 2 (two) times daily as needed (rash under breast).   Omega-3 Fatty Acids (OMEGA-3 FISH OIL PO) Take 1 capsule by mouth  daily.   omeprazole (PRILOSEC) 20 MG capsule Take 20 mg by mouth daily.   ondansetron (ZOFRAN) 4 MG tablet Take 4 mg by mouth every 6 (six) hours as needed for nausea or vomiting.   oseltamivir (TAMIFLU) 30 MG capsule Take 30 mg by mouth 2 (two) times daily.   polyethylene glycol (MIRALAX / GLYCOLAX) 17 g packet Take 17 g by mouth daily as needed for mild constipation.   Propylene Glycol (SYSTANE BALANCE) 0.6 % SOLN Place 1 drop into both eyes 2 (two) times daily.   rosuvastatin (CRESTOR) 10 MG tablet Take 1 tablet (10 mg total) by mouth daily.   tiZANidine (ZANAFLEX) 2 MG tablet Take 1 tablet as needed once a day for headache (Give Tizanidine instead of Tylenol)   topiramate (TOPAMAX) 100 MG tablet Take 300 mg by mouth 2 (two) times daily.   Vitamin D3 (VITAMIN D) 25 MCG tablet Take 1,000 Units by mouth daily.   zinc oxide 20 % ointment Apply 1 application. topically as needed (after every incontinent episode).   No facility-administered encounter medications on file as of 07/18/2023.    Allergies as of 07/18/2023 - Review Complete 07/18/2023  Allergen Reaction Noted   Aspirin Other (See Comments) 04/20/2016   Cheese  05/09/2019   Chocolate  05/09/2019   Coffee flavoring agent (non-screening)  05/09/2019   Other  05/09/2019   Red wine complex [germanium]  05/09/2019    Past Medical History:  Diagnosis Date   Acute kidney injury (HCC) 09/03/2019   05-2019  kidney stone removed   Allergy    seasonal allergies   Arthritis    LEFT hand   AVM (arteriovenous malformation) brain    Cataract    bilateral-not a surgical candidate at this time (07/23/2020)   COVID-19 virus infection 08/2020   GERD (gastroesophageal reflux disease)    on meds   Hyperlipidemia    diet controlled   Insulin resistance 02/16/2018   Seizures (HCC)    Grand Mal & Partial complex seizure disorder-last 1998   Stroke [facility])    5 total so far (age [phone])   Ureteral stone 05/10/2019   has multiple kidney  stones noted from Urologist   Uses roller walker     Past Surgical History:  Procedure Laterality Date   BRAIN SURGERY  09/19/1978   at [facility], Dr. Berdine Dance   BRONCHIAL BIOPSY  04/21/2021   Procedure: BRONCHIAL BIOPSIES;  Surgeon: Josephine Igo, DO;  [facility];  Service: Pulmonary;;   BRONCHIAL BRUSHINGS  04/21/2021   Procedure: BRONCHIAL BRUSHINGS;  Surgeon: Josephine Igo, DO;  [facility];  Service: Pulmonary;;   BRONCHIAL WASHINGS  04/21/2021   Procedure: BRONCHIAL WASHINGS;  Surgeon: Josephine Igo, DO;  [facility];  Service: Pulmonary;;   COLONOSCOPY  2017   [place], [place]   CYSTOSCOPY/URETEROSCOPY/HOLMIUM LASER/STENT PLACEMENT Left 05/10/2019   Procedure: CYSTOSCOPY/RETROGRADE/URETEROSCOPY/HOLMIUM LASER/STENT PLACEMENT;  Surgeon: Heloise Purpura, MD;  [facility];  Service: Urology;  Laterality: Left;   FIDUCIAL MARKER PLACEMENT  04/21/2021   Procedure: FIDUCIAL MARKER PLACEMENT;  Surgeon: Josephine Igo, DO;  [facility];  Service: Pulmonary;;   FOOT SURGERY Right 10/1985   ankle fusion, at [facility], Dr. Nyra Capes   INTRAMEDULLARY (IM) NAIL INTERTROCHANTERIC Right 07/09/2021   Procedure: INTRAMEDULLARY (IM) NAIL INTERTROCHANTRIC;  Surgeon: Samson Frederic, MD;  [facility];  Service: Orthopedics;  Laterality: Right;   POLYPECTOMY  2017   multiple adenomas   TUBAL LIGATION  1981   VIDEO BRONCHOSCOPY WITH RADIAL ENDOBRONCHIAL ULTRASOUND  04/21/2021   Procedure: RADIAL ENDOBRONCHIAL ULTRASOUND;  Surgeon: Josephine Igo, DO;  [facility];  Service: Pulmonary;;   WISDOM TOOTH EXTRACTION      Family History  Problem Relation Age of Onset   Heart disease Mother    Heart failure Mother    Heart attack Mother    Diabetes Father    Hyperlipidemia Father    Dementia Father    Prostate cancer Father    Melanoma Father    Colon polyps Father    Hyperlipidemia Paternal Uncle    Hypertension Paternal Uncle     Dementia Paternal Uncle    Heart attack Maternal Grandfather    Stroke Paternal Grandmother    CAD Maternal Uncle    COPD Maternal Uncle        smoker   Breast cancer Neg Hx    Colon cancer Neg Hx    Esophageal cancer Neg Hx    Stomach cancer Neg Hx    Rectal cancer Neg Hx     Social History   Socioeconomic History   Marital status: Single    Spouse name: Not on file   Number of children: Not on file   Years of education: Not on file   Highest education level: Not on file  Occupational History   Not on file  Tobacco Use   Smoking status: Never    Passive exposure: Past   Smokeless tobacco: Never  Vaping Use   Vaping status: Never  Used  Substance and Sexual Activity   Alcohol use: No   Drug use: No   Sexual activity: Not Currently    Birth control/protection: None    Comment: 1st intercourse 68 yo-Fewer than 5 partners  Other Topics Concern   Not on file  Social History Narrative   Right handed until 3rd grade    Left handed   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    Social Drivers of Health   Financial Resource Strain: Not on file  Food Insecurity: Not on file  Transportation Needs: Not on file  Physical Activity: Inactive (08/22/2018)   Exercise Vital Sign    Days of Exercise per Week: 0 days    Minutes of Exercise per Session: 0 min  Stress: No Stress Concern Present (08/22/2018)   [employer] of Occupational Health - Occupational Stress Questionnaire    Feeling of Stress : Only a little  Social Connections: Not on file  Intimate Partner Violence: Not on file    Review of Systems  Respiratory:  Negative for cough, chest tightness and shortness of breath.     Vitals:   07/18/23 0948  BP: 115/75  Pulse: 75  SpO2: 97%     Physical Exam Constitutional:      Appearance: Normal appearance.  HENT:     Head: Normocephalic.  Eyes:     General: No scleral icterus.    Pupils: Pupils are equal, round, and reactive to light.  Cardiovascular:      Rate and Rhythm: Normal rate and regular rhythm.     Heart sounds: No murmur heard.    No friction rub.  Pulmonary:     Effort: No respiratory distress.     Breath sounds: No stridor. No wheezing or rhonchi.  Musculoskeletal:     Cervical back: No rigidity or tenderness.  Neurological:     Mental Status: She is alert.  Psychiatric:        Mood and Affect: Mood normal.      Data Reviewed: Did review CT scan of the chest 02/03/2021  Reviewed CT scan of the chest June 2023  CT scan of the chest June 2024-stable findings compared to previous year  Assessment:  Lung nodule which has been stable on follow-up CT-semisolid nodule -Last CT scan was about a year ago on the 1 previously year prior was stable S/p bronchoscopy with benign findings 04/21/2021   Plan/Recommendations: Repeat CT scan of the chest in June 2025 to follow-up on the lung nodule  Follow-up in about 6 months  Encouraged to call with significant concerns   Virl Diamond MD [employer] [facility] 07/18/2023, 9:11 PM  CC: Mast, Man X, NP

## 2023-08-10 ENCOUNTER — Ambulatory Visit
Admission: RE | Admit: 2023-08-10 | Discharge: 2023-08-10 | Disposition: A | Discharge status: Home / Self Care | Payer: PRIVATE HEALTH INSURANCE | Source: Ambulatory Visit | Attending: Pulmonary Disease

## 2023-08-10 DIAGNOSIS — R9389 Abnormal findings on diagnostic imaging of other specified body structures: Secondary | ICD-10-CM

## 2023-08-29 ENCOUNTER — Encounter: Payer: Self-pay | Admitting: Nurse Practitioner

## 2023-08-29 ENCOUNTER — Non-Acute Institutional Stay: Admitting: Nurse Practitioner

## 2023-08-29 DIAGNOSIS — Z0000 Encounter for general adult medical examination without abnormal findings: Secondary | ICD-10-CM | POA: Diagnosis not present

## 2023-08-29 NOTE — Progress Notes (Signed)
 Subjective:   Tonya Thompson is a 68 y.o. female who presents for Medicare Annual (Subsequent) preventive examination.  Visit Complete: In person  Patient Medicare AWV questionnaire was completed by the patient on 08/29/23; I have confirmed that all information answered by patient is correct and no changes since this date.  Cardiac Risk Factors include: advanced age (>56men, >82 women);dyslipidemia;hypertension     Objective:    Today's Vitals   08/29/23 1003  BP: 131/64  Pulse: 70  Resp: 20  Temp: (!) 97.5 F (36.4 C)  SpO2: 97%  Weight: 166 lb 6.4 oz (75.5 kg)  Height: 5\' 5"  (1.651 m)   Body mass index is 27.69 kg/m.     08/29/2023   10:01 AM 04/20/2023   10:51 AM 02/16/2023    9:40 AM 12/31/2022    1:27 PM 08/18/2022    9:48 AM 08/17/2022   11:06 AM 07/27/2022   12:33 PM  Advanced Directives  Does Patient Have a Medical Advance Directive? Yes Yes Yes Yes Yes Yes Yes  Type of Estate agent of [place];Living will;Out of facility DNR (pink MOST or yellow form) Healthcare Power of [place];Living will;Out of facility DNR (pink MOST or yellow form) Healthcare Power of [place];Living will Out of facility DNR (pink MOST or yellow form);Healthcare Power of [employer] of [place];Living will;Out of facility DNR (pink MOST or yellow form) Healthcare Power of [place];Living will;Out of facility DNR (pink MOST or yellow form) Healthcare Power of [place];Living will;Out of facility DNR (pink MOST or yellow form)  Does patient want to make changes to medical advance directive? No - Patient declined No - Patient declined  No - Patient declined No - Patient declined No - Patient declined No - Patient declined  Copy of Healthcare Power of Attorney in Chart? Yes - validated most recent copy scanned in chart (See row information) Yes - validated most recent copy scanned in chart (See row information)  Yes - validated most recent copy scanned in  chart (See row information) Yes - validated most recent copy scanned in chart (See row information) Yes - validated most recent copy scanned in chart (See row information) Yes - validated most recent copy scanned in chart (See row information)  Pre-existing out of facility DNR order (yellow form or pink MOST form) Yellow form placed in chart (order not valid for inpatient use);Pink MOST form placed in chart (order not valid for inpatient use) Pink MOST form placed in chart (order not valid for inpatient use);Yellow form placed in chart (order not valid for inpatient use)   Pink MOST form placed in chart (order not valid for inpatient use);Yellow form placed in chart (order not valid for inpatient use) Pink MOST/Yellow Form most recent copy in chart - Physician notified to receive inpatient order Pink MOST/Yellow Form most recent copy in chart - Physician notified to receive inpatient order    Current Medications (verified) Outpatient Encounter Medications as of 08/29/2023  Medication Sig   acetaminophen  (TYLENOL ) 325 MG tablet Take 650 mg by mouth every 4 (four) hours as needed for headache or mild pain.   baclofen  (LIORESAL ) 10 MG tablet Take 10 mg by mouth at bedtime.   butalbital -acetaminophen -caffeine  (FIORICET) 50-325-40 MG tablet Take 1 tablet once a day as needed for severe migraine. Do not take more than 3 a week.   Calcium  Carbonate 500 MG CHEW Chew 500 mg by mouth in the morning and at bedtime.   fexofenadine (ALLEGRA)  180 MG tablet Take 180 mg by mouth daily.   guaiFENesin (MUCINEX) 600 MG 12 hr tablet Take 600 mg by mouth 2 (two) times daily.   lisinopril (ZESTRIL) 5 MG tablet Take 5 mg by mouth daily.   Multiple Vitamins-Minerals (THEREMS-M) TABS Take 1 tablet by mouth daily.   nystatin  (MYCOSTATIN /NYSTOP ) powder Apply 1 application. topically 2 (two) times daily as needed (rash under breast).   Omega-3 Fatty Acids (OMEGA-3 FISH OIL PO) Take 1 capsule by mouth daily.   omeprazole  (PRILOSEC) 20 MG capsule Take 20 mg by mouth daily.   ondansetron  (ZOFRAN ) 4 MG tablet Take 4 mg by mouth every 6 (six) hours as needed for nausea or vomiting.   polyethylene glycol (MIRALAX  / GLYCOLAX ) 17 g packet Take 17 g by mouth daily as needed for mild constipation.   Propylene Glycol (SYSTANE BALANCE) 0.6 % SOLN Place 1 drop into both eyes 2 (two) times daily.   rosuvastatin  (CRESTOR ) 10 MG tablet Take 1 tablet (10 mg total) by mouth daily.   tiZANidine  (ZANAFLEX ) 2 MG tablet Take 1 tablet as needed once a day for headache (Give Tizanidine  instead of Tylenol )   topiramate  (TOPAMAX ) 100 MG tablet Take 300 mg by mouth 2 (two) times daily.   Vitamin D3 (VITAMIN D ) 25 MCG tablet Take 1,000 Units by mouth daily.   zinc oxide 20 % ointment Apply 1 application. topically as needed (after every incontinent episode).   oseltamivir (TAMIFLU) 30 MG capsule Take 30 mg by mouth 2 (two) times daily. (Patient not taking: Reported on 08/29/2023)   No facility-administered encounter medications on file as of 08/29/2023.    Allergies (verified) Aspirin, Cheese, Chocolate, Coffee flavoring agent (non-screening), Other, and Red wine complex [germanium]   History: Past Medical History:  Diagnosis Date   Acute kidney injury (HCC) 09/03/2019   05-2019 kidney stone removed   Allergy    seasonal allergies   Arthritis    LEFT hand   AVM (arteriovenous malformation) brain    Cataract    bilateral-not a surgical candidate at this time (07/23/2020)   COVID-19 virus infection 08/2020   GERD (gastroesophageal reflux disease)    on meds   Hyperlipidemia    diet controlled   Insulin  resistance 02/16/2018   Seizures (HCC)    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 Cam Cava   BRONCHIAL BIOPSY  04/21/2021   Procedure: BRONCHIAL BIOPSIES;  Surgeon: Prudy Brownie, DO;  Location: [facility];  Service: Pulmonary;;   BRONCHIAL BRUSHINGS  04/21/2021   Procedure: BRONCHIAL BRUSHINGS;  Surgeon: Prudy Brownie, DO;  Location: [facility];  Service: Pulmonary;;   BRONCHIAL WASHINGS  04/21/2021   Procedure: BRONCHIAL WASHINGS;  Surgeon: Prudy Brownie, DO;  Location: [facility];  Service: Pulmonary;;   COLONOSCOPY  2017   [place],    CYSTOSCOPY/URETEROSCOPY/HOLMIUM LASER/STENT PLACEMENT Left 05/10/2019   Procedure: CYSTOSCOPY/RETROGRADE/URETEROSCOPY/HOLMIUM LASER/STENT PLACEMENT;  Surgeon: Florencio Hunting, MD;  Location: [facility];  Service: Urology;  Laterality: Left;   FIDUCIAL MARKER PLACEMENT  04/21/2021   Procedure: FIDUCIAL MARKER PLACEMENT;  Surgeon: Prudy Brownie, DO;  Location: [facility];  Service: Pulmonary;;   FOOT SURGERY Right 10/1985  ankle fusion, at [facility], Dr. Antonetta Kitchen   INTRAMEDULLARY (IM) NAIL INTERTROCHANTERIC Right 07/09/2021   Procedure: INTRAMEDULLARY (IM) NAIL INTERTROCHANTRIC;  Surgeon: Adonica Hoose, MD;  Location: [facility];  Service: Orthopedics;  Laterality: Right;   POLYPECTOMY  2017   multiple adenomas   TUBAL LIGATION  1981   VIDEO BRONCHOSCOPY WITH RADIAL ENDOBRONCHIAL ULTRASOUND  04/21/2021   Procedure: RADIAL ENDOBRONCHIAL ULTRASOUND;  Surgeon: Prudy Brownie, DO;  Location: [facility];  Service: Pulmonary;;   WISDOM TOOTH EXTRACTION     Family History  Problem Relation Age of Onset   Heart disease Mother    Heart failure Mother    Heart attack Mother    Diabetes Father    Hyperlipidemia Father    Dementia Father    Prostate cancer Father    Melanoma Father    Colon polyps Father    Hyperlipidemia Paternal Uncle    Hypertension Paternal Uncle    Dementia Paternal Uncle    Heart attack Maternal Grandfather    Stroke Paternal Grandmother    CAD Maternal Uncle    COPD Maternal  Uncle        smoker   Breast cancer Neg Hx    Colon cancer Neg Hx    Esophageal cancer Neg Hx    Stomach cancer Neg Hx    Rectal cancer Neg Hx    Social History   Socioeconomic History   Marital status: Single    Spouse name: Not on file   Number of children: Not on file   Years of education: Not on file   Highest education level: Not on file  Occupational History   Not on file  Tobacco Use   Smoking status: Never    Passive exposure: Past   Smokeless tobacco: Never  Vaping Use   Vaping status: Never Used  Substance and Sexual Activity   Alcohol  use: No   Drug use: No   Sexual activity: Not Currently    Birth control/protection: None    Comment: 1st intercourse 68 yo-Fewer than 5 partners  Other Topics Concern   Not on file  Social History Narrative   Right handed until 3rd grade    Left handed   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    Social Drivers of Health   Financial Resource Strain: Not on file  Food Insecurity: Not on file  Transportation Needs: Not on file  Physical Activity: Inactive (08/22/2018)   Exercise Vital Sign    Days of Exercise per Week: 0 days    Minutes of Exercise per Session: 0 min  Stress: No Stress Concern Present (08/22/2018)   [employer] of Occupational Health - Occupational Stress Questionnaire    Feeling of Stress : Only a little  Social Connections: Not on file    Tobacco Counseling Counseling given: Not Answered   Clinical Intake:     Pain : No/denies pain     BMI - recorded: 27.69 Nutritional Status: BMI 25 -29 Overweight Nutritional Risks: None Diabetes: No  How often do you need to have someone help you when you read instructions, pamphlets, or other written materials from your doctor or pharmacy?: 3 - Sometimes What is the last grade level you completed in school?: college  Interpreter Needed?: No  Information entered by :: Jackie Russman Daylene Evangelist NP   Activities of Daily Living    08/29/2023   12:00 PM  In  your present state of health, do you have any difficulty performing the following activities:  Hearing? 0  Vision? 0  Walking or climbing stairs? 1  Dressing or bathing? 1  Doing errands, shopping? 1  Preparing Food and eating ? N  Using the Toilet? N  In the past six months, have you accidently leaked urine? Y  Do you have problems with loss of bowel control? N  Managing your Medications? Y  Managing your Finances? Y  Housekeeping or managing your Housekeeping? Y    Patient Care Team: Delio Slates X, NP as PCP - General (Internal Medicine) Jann Melody, MD as PCP - Cardiology (Cardiology) Jhonny Moss, MD as Consulting Physician (Neurology) Nicolas Barren, MD as Consulting Physician (Nephrology) Florencio Hunting, MD as Consulting Physician (Urology)  Indicate any recent Medical Services you may have received from other than [facility] providers in the past year (date may be approximate).     Assessment:   This is a routine wellness examination for Jarrett.  Hearing/Vision screen No results found.   Goals Addressed             This Visit's Progress    Maintain Mobility and Function       Evidence-based guidance:  Acknowledge and validate impact of pain, loss of strength and potential disfigurement (hand osteoarthritis) on mental health and daily life, such as social isolation, anxiety, depression, impaired sexual relationship and   injury from falls.  Anticipate referral to physical or occupational therapy for assessment, therapeutic exercise and recommendation for adaptive equipment or assistive devices; encourage participation.  Assess impact on ability to perform activities of daily living, as well as engage in sports and leisure events or requirements of work or school.  Provide anticipatory guidance and reassurance about the benefit of exercise to maintain function; acknowledge and normalize fear that exercise may worsen symptoms.  Encourage regular exercise,  at least 10 minutes at a time for 45 minutes per week; consider yoga, water exercise and proprioceptive exercises; encourage use of wearable activity tracker to increase motivation and adherence.  Encourage maintenance or resumption of daily activities, including employment, as pain allows and with minimal exposure to trauma.  Assist patient to advocate for adaptations to the work environment.  Consider level of pain and function, gender, age, lifestyle, patient preference, quality of life, readiness and ?ocapacity to benefit? when recommending patients for orthopaedic surgery consultation.  Explore strategies, such as changes to medication regimen or activity that enables patient to anticipate and manage flare-ups that increase deconditioning and disability.  Explore patient preferences; encourage exposure to a broader range of activities that have been avoided for fear of experiencing pain.  Identify barriers to participation in therapy or exercise, such as pain with activity, anticipated or imagined pain.  Monitor postoperative joint replacement or any preexisting joint replacement for ongoing pain and loss of function; provide social support and encouragement throughout recovery.   Notes:        Depression Screen    08/29/2023   12:01 PM 07/27/2022   12:32 PM 07/14/2022    9:17 AM 01/27/2021    9:14 PM 12/19/2020    9:08 AM 07/21/2020    6:00 PM 06/26/2020    9:52 AM  PHQ 2/9 Scores  PHQ - 2 Score 0 0 0 0 0 0 0    Fall Risk    07/18/2023    9:47 AM 02/16/2023    9:40 AM 07/27/2022   12:32 PM 07/14/2022    9:17 AM 02/26/2022    1:30 PM  Fall Risk   Falls in the  past year? 0 0 0 0 0  Number falls in past yr:  0 0 0 0  Injury with Fall?  0 0 0 0  Risk for fall due to : Impaired balance/gait;Impaired mobility  History of fall(s) History of fall(s)   Follow up  Falls evaluation completed Falls evaluation completed Falls evaluation completed Falls evaluation completed    MEDICARE  RISK AT HOME: Medicare Risk at Home Any stairs in or around the home?: Yes If so, are there any without handrails?: No Home free of loose throw rugs in walkways, pet beds, electrical cords, etc?: Yes Adequate lighting in your home to reduce risk of falls?: Yes Life alert?: No Use of a cane, walker or w/c?: Yes Grab bars in the bathroom?: Yes Shower chair or bench in shower?: Yes Elevated toilet seat or a handicapped toilet?: Yes  TIMED UP AND GO:  Was the test performed?  Yes  Length of time to ambulate 10 feet: 12 sec Gait slow and steady with assistive device    Cognitive Function:        Immunizations Immunization History  Administered Date(s) Administered   Influenza Inj Mdck Quad Pf 02/16/2017   Influenza Inj Mdck Quad With Preservative 02/16/2018, 03/01/2019   Influenza, High Dose Seasonal PF 02/24/2021, 02/02/2023   Influenza,inj,Quad PF,6+ Mos 03/01/2017   Influenza,inj,quad, With Preservative 02/18/2015   Influenza-Unspecified 03/01/2017, 12/17/2019, 02/24/2021, 02/22/2022   Moderna Covid-19 Vaccine  Bivalent Booster 6yrs & up 09/18/2021, 02/16/2023   PFIZER(Purple Top)SARS-COV-2 Vaccination 07/22/2019, 08/12/2019, 03/10/2020, 11/10/2020, 01/20/2021   PPD Test 01/28/2021, 07/17/2021, 07/30/2021   Pfizer Covid-19 Vaccine Bivalent Booster 54yrs & up 03/04/2022   Pneumococcal Conjugate-13 04/18/2016   Pneumococcal Polysaccharide-23 05/03/2004   Pneumococcal-Unspecified 05/03/2004   Respiratory Syncytial Virus Vaccine,Recomb Aduvanted(Arexvy) 04/30/2022   Td 10/19/2019   Td (Adult),unspecified 10/19/2019   Tdap 08/11/2009   Zoster, Live 04/14/2012    TDAP status: Up to date  Flu Vaccine status: Up to date  Pneumococcal vaccine status: Up to date  Covid-19 vaccine status: Completed vaccines  Qualifies for Shingles Vaccine? Yes   Zostavax completed Yes   Shingrix Completed?: Yes  Screening Tests Health Maintenance  Topic Date Due   Cervical Cancer  Screening (HPV/Pap Cotest)  12/06/2020   Zoster Vaccines- Shingrix (1 of 2) 10/18/2023 (Originally 01/30/1975)   COVID-19 Vaccine (9 - 2024-25 season) 01/02/2024 (Originally 04/13/2023)   Pneumonia Vaccine 40+ Years old (3 of 3 - PPSV23, PCV20 or PCV21) 07/17/2024 (Originally 06/13/2016)   INFLUENZA VACCINE  12/02/2023   Colonoscopy  01/09/2024   Medicare Annual Wellness (AWV)  08/28/2024   MAMMOGRAM  11/28/2024   DTaP/Tdap/Td (4 - Td or Tdap) 10/18/2029   DEXA SCAN  Completed   HPV VACCINES  Aged Out   Meningococcal B Vaccine  Aged Out   Hepatitis C Screening  Discontinued   HIV Screening  Discontinued    Health Maintenance  Health Maintenance Due  Topic Date Due   Cervical Cancer Screening (HPV/Pap Cotest)  12/06/2020    Colorectal cancer screening: No longer required.    Mammography 11/29/22 Bone Density status: Completed 08/24/2022. Results reflect: Bone density results: NORMAL. Repeat every 5 years.  Lung Cancer Screening: (Low Dose CT Chest recommended if Age 45-80 years, 20 pack-year currently smoking OR have quit w/in 15years.) does not qualify.   Lung Cancer Screening Referral: NA  Additional Screening:  Hepatitis C Screening: does not qualify  Vision Screening: Recommended annual ophthalmology exams for early detection of glaucoma and other disorders of the  eye. Is the patient up to date with their annual eye exam?  Yes  Who is the provider or what is the name of the office in which the patient attends annual eye exams? Dr. Geralyn Knee 05/26/2023 If pt is not established with a provider, would they like to be referred to a provider to establish care? No .   Dental Screening: Recommended annual dental exams for proper oral hygiene  Diabetic Foot Exam: NA  Community Resource Referral / Chronic Care Management: CRR required this visit?  No   CCM required this visit?  No     Plan:     I have personally reviewed and noted the following in the patient's chart:    Medical and social history Use of alcohol , tobacco or illicit drugs  Current medications and supplements including opioid prescriptions. Patient is not currently taking opioid prescriptions. Functional ability and status Nutritional status Physical activity Advanced directives List of other physicians Hospitalizations, surgeries, and ER visits in previous 12 months Vitals Screenings to include cognitive, depression, and falls Referrals and appointments  In addition, I have reviewed and discussed with patient certain preventive protocols, quality metrics, and best practice recommendations. A written personalized care plan for preventive services as well as general preventive health recommendations were provided to patient.     Diane Mochizuki X Manish Ruggiero, NP   08/29/2023   After Visit Summary: (In Person-Declined) Patient declined AVS at this time.

## 2023-08-30 ENCOUNTER — Encounter: Payer: Self-pay | Admitting: Nurse Practitioner

## 2023-09-16 ENCOUNTER — Encounter: Payer: Self-pay | Admitting: Nurse Practitioner

## 2023-09-16 ENCOUNTER — Non-Acute Institutional Stay: Payer: Self-pay | Admitting: Nurse Practitioner

## 2023-09-16 DIAGNOSIS — K5901 Slow transit constipation: Secondary | ICD-10-CM | POA: Diagnosis not present

## 2023-09-16 DIAGNOSIS — E782 Mixed hyperlipidemia: Secondary | ICD-10-CM | POA: Diagnosis not present

## 2023-09-16 DIAGNOSIS — R918 Other nonspecific abnormal finding of lung field: Secondary | ICD-10-CM

## 2023-09-16 DIAGNOSIS — G40909 Epilepsy, unspecified, not intractable, without status epilepticus: Secondary | ICD-10-CM

## 2023-09-16 DIAGNOSIS — R7989 Other specified abnormal findings of blood chemistry: Secondary | ICD-10-CM

## 2023-09-16 DIAGNOSIS — I10 Essential (primary) hypertension: Secondary | ICD-10-CM

## 2023-09-16 DIAGNOSIS — G8191 Hemiplegia, unspecified affecting right dominant side: Secondary | ICD-10-CM

## 2023-09-16 DIAGNOSIS — G3184 Mild cognitive impairment, so stated: Secondary | ICD-10-CM

## 2023-09-16 DIAGNOSIS — G43909 Migraine, unspecified, not intractable, without status migrainosus: Secondary | ICD-10-CM

## 2023-09-16 NOTE — Progress Notes (Signed)
 Location:   [facility] Room Number: 819 Place of Service:  AL[facility] (13) Provider: Abner Hoffman Jerri Glauser NP  Arthur Speagle X, NP  Patient Care Team: Madesyn Ast X, NP as PCP - General (Internal Medicine) Jann Melody, MD as PCP - Cardiology (Cardiology) Jhonny Moss, MD as Consulting Physician (Neurology) Nicolas Barren, MD as Consulting Physician (Nephrology) Florencio Hunting, MD as Consulting Physician (Urology)  Extended Emergency Contact Information Primary Emergency Contact: Lawanda Prayer, [place] Home Phone: [phone] Mobile Phone: [phone] Relation: Cousin Secondary Emergency Contact: Roper,Paula Address: [address]          [place], [place] [address] [place] Home Phone: [phone] Mobile Phone: [phone] Relation: Sister  Code Status:  DNR Goals of care: Advanced Directive information    08/29/2023   10:01 AM  Advanced Directives  Does Patient Have a Medical Advance Directive? Yes  Type of Estate agent of [place];Living will;Out of facility DNR (pink MOST or yellow form)  Does patient want to make changes to medical advance directive? No - Patient declined  Copy of Healthcare Power of Attorney in Chart? Yes - validated most recent copy scanned in chart (See row information)  Pre-existing out of facility DNR order (yellow form or pink MOST form) Yellow form placed in chart (order not valid for inpatient use);Pink MOST form placed in chart (order not valid for inpatient use)     Chief Complaint  Patient presents with   Medical Management of Chronic Issues    HPI:  Pt is a 68 y.o. female seen today for medical management of chronic diseases.    Elevated alk phos 173 2/29/42>>154(37-153) Constipation, stable, prn MiraLax              R hip pain, on Tylenol  for pain, R hip ORIF 07/09/21             Dysphagia 2, thin liquid.              GERD, stable              Hypokalemia, K 4.2 06/21/23             CAD, no angina. F/u Cardiology.              HTN, controlled, on Lisinopril             HLD LDL 76 06/21/23, Cardiology Rosuvastatin              Impaired cognition, difficulty word findings/forming sentences. 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 MMSE 28/30 08/30/23 Migraines, no flare ups since 2017, Tylenol , Zanaflex , treated with steroids in the past.              Pulmonary nodules, f/u Pulmonology, R upper lobe nodule, had bronchoscopy 04/21/21-negative for malignancy or infection.              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 takes Baclofen  nightly.              Hx of cerebral AVMs  CKD III, chronic             Gait abnormality, patient ambulated with walker, R ankle brace.              Anemia, Hgb 14 06/21/23     Past Medical History:  Diagnosis Date   Acute kidney injury (HCC) 09/03/2019   05-2019 kidney stone removed   Allergy    seasonal allergies   Arthritis    LEFT hand   AVM (arteriovenous malformation) brain    Cataract    bilateral-not a surgical candidate at this time (07/23/2020)   COVID-19 virus infection 08/2020   GERD (gastroesophageal reflux disease)    on meds   Hyperlipidemia    diet controlled   Insulin  resistance 02/16/2018   Seizures (HCC)    Grand Mal & Partial complex seizure disorder-last 1998   Stroke [facility])    5 total so far (age [phone])   Ureteral stone 05/10/2019   has multiple kidney stones noted from Urologist   Uses roller walker    Past Surgical History:  Procedure Laterality Date   BRAIN SURGERY  09/19/1978   at [facility], Dr. Cam Cava   BRONCHIAL BIOPSY   04/21/2021   Procedure: BRONCHIAL BIOPSIES;  Surgeon: Prudy Brownie, DO;  Location: [facility];  Service: Pulmonary;;   BRONCHIAL BRUSHINGS  04/21/2021   Procedure: BRONCHIAL BRUSHINGS;  Surgeon: Prudy Brownie, DO;  Location: [facility];  Service: Pulmonary;;   BRONCHIAL WASHINGS  04/21/2021   Procedure: BRONCHIAL WASHINGS;  Surgeon: Prudy Brownie, DO;  Location: [facility];  Service: Pulmonary;;   COLONOSCOPY  2017   [place],    CYSTOSCOPY/URETEROSCOPY/HOLMIUM LASER/STENT PLACEMENT Left 05/10/2019   Procedure: CYSTOSCOPY/RETROGRADE/URETEROSCOPY/HOLMIUM LASER/STENT PLACEMENT;  Surgeon: Florencio Hunting, MD;  Location: [facility];  Service: Urology;  Laterality: Left;   FIDUCIAL MARKER PLACEMENT  04/21/2021   Procedure: FIDUCIAL MARKER PLACEMENT;  Surgeon: Prudy Brownie, DO;  Location: [facility];  Service: Pulmonary;;   FOOT SURGERY Right 10/1985   ankle fusion, at [facility], Dr. Antonetta Kitchen   INTRAMEDULLARY (IM) NAIL INTERTROCHANTERIC Right 07/09/2021   Procedure: INTRAMEDULLARY (IM) NAIL INTERTROCHANTRIC;  Surgeon: Adonica Hoose, MD;  Location: [facility];  Service: Orthopedics;  Laterality: Right;   POLYPECTOMY  2017   multiple adenomas   TUBAL LIGATION  1981   VIDEO BRONCHOSCOPY WITH RADIAL ENDOBRONCHIAL ULTRASOUND  04/21/2021   Procedure: RADIAL ENDOBRONCHIAL ULTRASOUND;  Surgeon: Prudy Brownie, DO;  Location: [facility];  Service: Pulmonary;;   WISDOM TOOTH EXTRACTION      Allergies  Allergen Reactions   Aspirin Other (See Comments)    CONTRAINDICATED DUE TO HISTORY OF BLEEDING IN BRAIN   Cheese     Cant take due to history of severe migraines   Chocolate     Cant take due to history of severe migraines   Coffee Flavoring Agent (Non-Screening)     Cant take due to history of severe migraines   Other      Nuts - Cant take due to history of severe migraines   Red Wine Complex [Germanium]     Cant take due to history of severe migraines    Allergies as of 09/16/2023        Reactions   Aspirin Other (See Comments)   CONTRAINDICATED DUE TO HISTORY OF BLEEDING IN BRAIN   Cheese    Cant take due to history of severe migraines   Chocolate    Cant take due to history of severe migraines  Coffee Flavoring Agent (non-screening)    Cant take due to history of severe migraines   Other     Nuts - Cant take due to history of severe migraines   Red Wine Complex [germanium]    Cant take due to history of severe migraines        Medication List        Accurate as of Sep 16, 2023 11:59 PM. If you have any questions, ask your nurse or doctor.          acetaminophen  325 MG tablet Commonly known as: TYLENOL  Take 650 mg by mouth every 4 (four) hours as needed for headache or mild pain.   baclofen  10 MG tablet Commonly known as: LIORESAL  Take 10 mg by mouth at bedtime.   butalbital -acetaminophen -caffeine  50-325-40 MG tablet Commonly known as: FIORICET Take 1 tablet once a day as needed for severe migraine. Do not take more than 3 a week.   Calcium  Carbonate 500 MG Chew Chew 500 mg by mouth in the morning and at bedtime.   fexofenadine 180 MG tablet Commonly known as: ALLEGRA Take 180 mg by mouth daily.   guaiFENesin 600 MG 12 hr tablet Commonly known as: MUCINEX Take 600 mg by mouth 2 (two) times daily.   lisinopril 5 MG tablet Commonly known as: ZESTRIL Take 5 mg by mouth daily.   nystatin  powder Commonly known as: MYCOSTATIN /NYSTOP  Apply 1 application. topically 2 (two) times daily as needed (rash under breast).   OMEGA-3 FISH OIL PO Take 1 capsule by mouth daily.   omeprazole 20 MG capsule Commonly known as: PRILOSEC Take 20 mg by mouth daily.   ondansetron  4 MG tablet Commonly known as: ZOFRAN  Take 4 mg by mouth every 6 (six) hours as needed for nausea or vomiting.   oseltamivir 30 MG capsule Commonly known as: TAMIFLU Take 30 mg by mouth 2 (two) times daily.   polyethylene glycol 17 g packet Commonly known as: MIRALAX  /  GLYCOLAX  Take 17 g by mouth daily as needed for mild constipation.   rosuvastatin  10 MG tablet Commonly known as: CRESTOR  Take 1 tablet (10 mg total) by mouth daily.   Systane Balance 0.6 % Soln Generic drug: Propylene Glycol Place 1 drop into both eyes 2 (two) times daily.   Therems-M Tabs Take 1 tablet by mouth daily.   tiZANidine  2 MG tablet Commonly known as: ZANAFLEX  Take 1 tablet as needed once a day for headache (Give Tizanidine  instead of Tylenol )   topiramate  100 MG tablet Commonly known as: TOPAMAX  Take 300 mg by mouth 2 (two) times daily.   vitamin D3 25 MCG tablet Commonly known as: CHOLECALCIFEROL  Take 1,000 Units by mouth daily.   zinc oxide 20 % ointment Apply 1 application. topically as needed (after every incontinent episode).        Review of Systems  Constitutional:  Negative for activity change, fatigue and fever.  HENT:  Positive for trouble swallowing. Negative for congestion, rhinorrhea and sore throat.   Eyes:  Negative for visual disturbance.       Lateral right peripheral vision lost  Respiratory:  Negative for cough, shortness of breath and wheezing.   Cardiovascular:  Positive for leg swelling.  Gastrointestinal:  Negative for abdominal pain and constipation.  Genitourinary:  Positive for frequency. Negative for dysuria and urgency.       Chronic  Musculoskeletal:  Positive for arthralgias and gait problem.       R hip pain, better. Right ankle brace.  R wrist contracture.   Skin:  Negative for color change.  Neurological:  Positive for speech difficulty and weakness. Negative for seizures and headaches.       Difficulty words finding, memory lapses.   Psychiatric/Behavioral:  Negative for behavioral problems and sleep disturbance. The patient is not nervous/anxious.     Immunization History  Administered Date(s) Administered   Influenza Inj Mdck Quad Pf 02/16/2017   Influenza Inj Mdck Quad With Preservative 02/16/2018, 03/01/2019    Influenza, High Dose Seasonal PF 02/24/2021, 02/02/2023   Influenza,inj,Quad PF,6+ Mos 03/01/2017   Influenza,inj,quad, With Preservative 02/18/2015   Influenza-Unspecified 03/01/2017, 12/17/2019, 02/24/2021, 02/22/2022   Moderna Covid-19 Vaccine  Bivalent Booster 29yrs & up 09/18/2021, 02/16/2023   PFIZER(Purple Top)SARS-COV-2 Vaccination 07/22/2019, 08/12/2019, 03/10/2020, 11/10/2020, 01/20/2021   PPD Test 01/28/2021, 07/17/2021, 07/30/2021   Pfizer Covid-19 Vaccine Bivalent Booster 84yrs & up 03/04/2022   Pneumococcal Conjugate-13 04/18/2016   Pneumococcal Polysaccharide-23 05/03/2004   Pneumococcal-Unspecified 05/03/2004   Respiratory Syncytial Virus Vaccine,Recomb Aduvanted(Arexvy) 04/30/2022   Td 10/19/2019   Td (Adult),unspecified 10/19/2019   Tdap 08/11/2009   Zoster, Live 04/14/2012   Pertinent  Health Maintenance Due  Topic Date Due   Colonoscopy  01/09/2024   INFLUENZA VACCINE  12/02/2023   MAMMOGRAM  11/28/2024   DEXA SCAN  Completed      02/26/2022    1:30 PM 07/14/2022    9:17 AM 07/27/2022   12:32 PM 02/16/2023    9:40 AM 07/18/2023    9:47 AM  Fall Risk  Falls in the past year? 0 0 0 0 0  Was there an injury with Fall? 0 0 0 0   Fall Risk Category Calculator 0 0 0 0   Fall Risk Category (Retired) Low      (RETIRED) Patient Fall Risk Level High fall risk      Patient at Risk for Falls Due to  History of fall(s) History of fall(s)  Impaired balance/gait;Impaired mobility  Fall risk Follow up Falls evaluation completed Falls evaluation completed Falls evaluation completed Falls evaluation completed    Functional Status Survey:    Vitals:   09/16/23 1547  BP: 115/74  Pulse: 78  Resp: 19  Temp: (!) 97.5 F (36.4 C)  SpO2: 98%  Weight: 167 lb 3.2 oz (75.8 kg)   Body mass index is 27.82 kg/m. Physical Exam Vitals and nursing note reviewed.  Constitutional:      Comments: fatigue  HENT:     Head: Normocephalic and atraumatic.     Nose: Nose normal.      Mouth/Throat:     Mouth: Mucous membranes are moist.  Eyes:     Extraocular Movements: Extraocular movements intact.     Pupils: Pupils are equal, round, and reactive to light.  Cardiovascular:     Rate and Rhythm: Normal rate and regular rhythm.     Heart sounds: No murmur heard. Pulmonary:     Effort: Pulmonary effort is normal.     Breath sounds: No rales.  Abdominal:     General: Bowel sounds are normal.     Palpations: Abdomen is soft.     Tenderness: There is no abdominal tenderness.  Musculoskeletal:        General: Deformity present.     Cervical back: Normal range of motion and neck supple.     Right lower leg: Edema present.     Left lower leg: Edema present.     Comments: Right wrist contracture, right ankle brace. Trace edema BLE  Skin:    General: Skin is warm and dry.  Neurological:     General: No focal deficit present.     Mental Status: She is alert and oriented to person, place, and time. Mental status is at baseline.     Cranial Nerves: No cranial nerve deficit.     Motor: Weakness present.     Coordination: Coordination abnormal.     Gait: Gait abnormal.     Comments: Right wrist contracture, muscle strength 1/5 RUE, 4-5 RLE. Uses R ankle brace.  Psychiatric:        Mood and Affect: Mood normal.        Behavior: Behavior normal.        Thought Content: Thought content normal.     Labs reviewed: Recent Labs    06/21/23 0000  NA 139  K 4.2  CL 110*  CO2 23*  BUN 32*  CALCIUM  8.8   Recent Labs    06/21/23 0000  AST 13  ALT 14  ALKPHOS 128*  ALBUMIN 4.1   Recent Labs    06/21/23 0000  WBC 5.6  NEUTROABS 3,534.00  HGB 14.0  HCT 43  PLT 271   Lab Results  Component Value Date   TSH 3.039 07/15/2021   Lab Results  Component Value Date   HGBA1C 5.7 06/21/2023   Lab Results  Component Value Date   CHOL 146 06/21/2023   HDL 46 06/21/2023   LDLCALC 76 06/21/2023   LDLDIRECT 66 07/01/2022   TRIG 142 06/21/2023   CHOLHDL 2.7  07/09/2021    Significant Diagnostic Results in last 30 days:  No results found.  Assessment/Plan  Elevated LFTs alk phos 173 2/29/42>>154(37-153)  Slow transit constipation stable, prn MiraLax   HTN (hypertension) Blood pressure is controlled, on Lisinopril  Hyperlipidemia, mixed LDL 76 06/21/23, Cardiology Rosuvastatin   Mild cognitive impairment difficulty word findings/forming sentences. 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 MMSE 28/30 08/30/23  Migraine no flare ups since 2017, Tylenol , Zanaflex , treated with steroids in the past.   Pulmonary nodules  f/u Pulmonology, R upper lobe nodule, had bronchoscopy 04/21/21-negative for malignancy or infection.  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 takes Baclofen  nightly.              Hx of cerebral AVMs   Family/ staff Communication: plan of care reviewed with the patient and charge nurse.   Labs/tests ordered:  none

## 2023-09-16 NOTE — Assessment & Plan Note (Signed)
Blood pressure is controlled, on Lisinopril.

## 2023-09-16 NOTE — Assessment & Plan Note (Signed)
no flare ups since 2017, Tylenol, Zanaflex, treated with steroids in the past.  

## 2023-09-16 NOTE — Assessment & Plan Note (Signed)
 difficulty word findings/forming sentences. 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 MMSE 28/30 08/30/23

## 2023-09-16 NOTE — Assessment & Plan Note (Signed)
 R hemiparesis, lateral right peripheral vision deficit, right wrist contracture, as an result of a hemorrhagic stroke at age of 18 and subsequent strokes x5 per patient. takes Baclofen  nightly.              Hx of cerebral AVMs

## 2023-09-16 NOTE — Assessment & Plan Note (Signed)
 alk phos 173 2/29/42>>154(37-153)

## 2023-09-16 NOTE — Assessment & Plan Note (Signed)
 LDL 76 06/21/23, Cardiology Rosuvastatin 

## 2023-09-16 NOTE — Assessment & Plan Note (Signed)
 f/u Pulmonology, R upper lobe nodule, had bronchoscopy 04/21/21-negative for malignancy or infection.  Repeat CT 10/2023

## 2023-09-16 NOTE — Assessment & Plan Note (Signed)
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

## 2023-09-16 NOTE — Assessment & Plan Note (Signed)
 stable, prn MiraLax 

## 2023-10-19 ENCOUNTER — Encounter: Payer: Self-pay | Admitting: Nurse Practitioner

## 2023-10-19 DIAGNOSIS — Z1231 Encounter for screening mammogram for malignant neoplasm of breast: Secondary | ICD-10-CM

## 2023-10-20 ENCOUNTER — Other Ambulatory Visit: Payer: Self-pay | Admitting: Sports Medicine

## 2023-10-20 DIAGNOSIS — Z1231 Encounter for screening mammogram for malignant neoplasm of breast: Secondary | ICD-10-CM

## 2023-11-28 ENCOUNTER — Encounter: Payer: Self-pay | Admitting: Sports Medicine

## 2023-11-28 ENCOUNTER — Non-Acute Institutional Stay: Admitting: Sports Medicine

## 2023-11-28 DIAGNOSIS — G40909 Epilepsy, unspecified, not intractable, without status epilepticus: Secondary | ICD-10-CM

## 2023-11-28 DIAGNOSIS — E782 Mixed hyperlipidemia: Secondary | ICD-10-CM

## 2023-11-28 DIAGNOSIS — I10 Essential (primary) hypertension: Secondary | ICD-10-CM | POA: Diagnosis not present

## 2023-11-28 DIAGNOSIS — K219 Gastro-esophageal reflux disease without esophagitis: Secondary | ICD-10-CM

## 2023-11-28 DIAGNOSIS — G8191 Hemiplegia, unspecified affecting right dominant side: Secondary | ICD-10-CM | POA: Diagnosis not present

## 2023-11-28 NOTE — Progress Notes (Unsigned)
 Location:   [facility] Room Number: 819-A Place of Service:  ALF [phone]) Provider:  Oren Blazing  PCP: Mast, Man X, NP  Patient Care Team: Mast, Man X, NP as PCP - General (Internal Medicine) Santo Stanly LABOR, MD as PCP - Cardiology (Cardiology) Georjean Darice HERO, MD as Consulting Physician (Neurology) Prescilla Beams, MD as Consulting Physician (Nephrology) Renda Glance, MD as Consulting Physician (Urology)  Extended Emergency Contact Information Primary Emergency Contact: Devra Sari Morita, [place] Home Phone: [phone] Mobile Phone: [phone] Relation: Cousin Secondary Emergency Contact: Roper,Paula Address: [address]          [place], [place] [address] [place] Home Phone: [phone] Mobile Phone: [phone] Relation: Sister  Code Status:  DNR Goals of care: Advanced Directive information    11/28/2023    9:45 AM  Advanced Directives  Does Patient Have a Medical Advance Directive? Yes  Type of Estate agent of [place];Living will;Out of facility DNR (pink MOST or yellow form)  Does patient want to make changes to medical advance directive? No - Patient declined  Copy of Healthcare Power of Attorney in Chart? Yes - validated most recent copy scanned in chart (See row information)     Chief Complaint  Patient presents with   Medical Management of Chronic Issues    Routine visit. Discuss the need for 2nd Shingrix vaccine, and Pap smear.    HPI:  Pt is a 68 y.o. female with PMH of  GERD, HTN, HLD, CAD is seen today for medical management of chronic diseases.  Pt seen and examined in her room, she seems pleasant and comfortable and does not appear to be in distress.  Pt is laying on her recliner chair.  Pt ambulates with a walker, she goes to dining room to eat her meals. No recent falls Pt denies chest pain, palpitations, SOB,  abdominal pain, nausea, vomiting, dysuria, hematuria, bloody or dark stools.      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 Alm Sor   BRONCHIAL BIOPSY  04/21/2021   Procedure: BRONCHIAL BIOPSIES;  Surgeon: Brenna Adine CROME, DO;  Location: [facility];  Service: Pulmonary;;   BRONCHIAL BRUSHINGS  04/21/2021   Procedure: BRONCHIAL BRUSHINGS;  Surgeon: Brenna Adine CROME, DO;  Location: [facility];  Service: Pulmonary;;   BRONCHIAL WASHINGS  04/21/2021   Procedure: BRONCHIAL WASHINGS;  Surgeon: Brenna Adine CROME, DO;  Location: [facility];  Service: Pulmonary;;   COLONOSCOPY  2017   [place]   CYSTOSCOPY/URETEROSCOPY/HOLMIUM LASER/STENT PLACEMENT Left 05/10/2019   Procedure: CYSTOSCOPY/RETROGRADE/URETEROSCOPY/HOLMIUM  LASER/STENT PLACEMENT;  Surgeon: Renda Glance, MD;  Location: [facility];  Service: Urology;  Laterality: Left;   FIDUCIAL MARKER PLACEMENT  04/21/2021   Procedure: FIDUCIAL MARKER PLACEMENT;  Surgeon: Brenna Adine CROME, DO;  Location: [facility];  Service: Pulmonary;;   FOOT SURGERY Right 10/1985   ankle fusion, at [facility], Dr. Janeece   INTRAMEDULLARY (IM) NAIL INTERTROCHANTERIC Right 07/09/2021   Procedure: INTRAMEDULLARY (IM) NAIL INTERTROCHANTRIC;   Surgeon: Fidel Rogue, MD;  Location: [facility];  Service: Orthopedics;  Laterality: Right;   POLYPECTOMY  2017   multiple adenomas   TUBAL LIGATION  1981   VIDEO BRONCHOSCOPY WITH RADIAL ENDOBRONCHIAL ULTRASOUND  04/21/2021   Procedure: RADIAL ENDOBRONCHIAL ULTRASOUND;  Surgeon: Brenna Adine CROME, DO;  Location: [facility];  Service: Pulmonary;;   WISDOM TOOTH EXTRACTION      Allergies  Allergen Reactions   Aspirin Other (See Comments)    CONTRAINDICATED DUE TO HISTORY OF BLEEDING IN BRAIN   Cheese     Cant take due to history of severe migraines   Chocolate     Cant take due to history of severe migraines   Coffee Flavoring Agent (Non-Screening)     Cant take due to history of severe migraines   Other      Nuts - Cant take due to history of severe migraines   Red Wine Complex [Germanium]     Cant take due to history of severe migraines    Allergies as of 11/28/2023       Reactions   Aspirin Other (See Comments)   CONTRAINDICATED DUE TO HISTORY OF BLEEDING IN BRAIN   Cheese    Cant take due to history of severe migraines   Chocolate    Cant take due to history of severe migraines   Coffee Flavoring Agent (non-screening)    Cant take due to history of severe migraines   Other     Nuts - Cant take due to history of severe migraines   Red Wine Complex [germanium]    Cant take due to history of severe migraines        Medication List        Accurate as of November 28, 2023  9:46 AM. If you have any questions, ask your nurse or doctor.          acetaminophen  325 MG tablet Commonly known as: TYLENOL  Take 650 mg by mouth every 4 (four) hours as needed for headache or mild pain.   baclofen  10 MG tablet Commonly known as: LIORESAL  Take 10 mg by mouth at bedtime.   butalbital -acetaminophen -caffeine  50-325-40 MG tablet Commonly known as: FIORICET Take 1 tablet once a day as needed for severe migraine. Do not take more than 3 a week.   Calcium  Carbonate 500 MG  Chew Chew 500 mg by mouth in the morning and at bedtime.   fexofenadine 180 MG tablet Commonly known as: ALLEGRA Take 180 mg by mouth daily.   fexofenadine 60 MG tablet Commonly known as: ALLEGRA Take 60 mg by mouth daily.   guaiFENesin 600 MG 12 hr tablet Commonly known as: MUCINEX Take 600 mg by mouth 2 (two) times daily as needed.   lisinopril 5 MG tablet Commonly known as: ZESTRIL Take 5 mg by mouth daily.   nystatin  powder Commonly known as: MYCOSTATIN /NYSTOP  Apply 1 application. topically 2 (two) times daily as needed (rash under breast).   OMEGA-3 FISH OIL PO Take 1 capsule by mouth daily.   omeprazole 20 MG capsule Commonly  known as: PRILOSEC Take 20 mg by mouth daily.   ondansetron  4 MG tablet Commonly known as: ZOFRAN  Take 4 mg by mouth every 6 (six) hours as needed for nausea or vomiting.   oseltamivir 30 MG capsule Commonly known as: TAMIFLU Take 30 mg by mouth 2 (two) times daily.   polyethylene glycol 17 g packet Commonly known as: MIRALAX  / GLYCOLAX  Take 17 g by mouth daily as needed for mild constipation.   rosuvastatin  10 MG tablet Commonly known as: CRESTOR  Take 1 tablet (10 mg total) by mouth daily.   Systane Balance 0.6 % Soln Generic drug: Propylene Glycol Place 1 drop into both eyes 2 (two) times daily.   Therems-M Tabs Take 1 tablet by mouth daily.   tiZANidine  2 MG tablet Commonly known as: ZANAFLEX  Take 1 tablet as needed once a day for headache (Give Tizanidine  instead of Tylenol )   topiramate  100 MG tablet Commonly known as: TOPAMAX  Take 300 mg by mouth 2 (two) times daily.   vitamin D3 25 MCG tablet Commonly known as: CHOLECALCIFEROL  Take 1,000 Units by mouth daily.   zinc oxide 20 % ointment Apply 1 application. topically as needed (after every incontinent episode).        Review of Systems  Constitutional:  Negative for fever.  HENT:  Negative for sore throat.   Respiratory:  Negative for cough, shortness of  breath and wheezing.   Cardiovascular:  Negative for chest pain, palpitations and leg swelling.  Gastrointestinal:  Negative for abdominal distention, abdominal pain, blood in stool, constipation, diarrhea, nausea and vomiting.  Genitourinary:  Negative for dysuria, frequency and urgency.  Neurological:  Negative for dizziness.  Psychiatric/Behavioral:  Negative for confusion.     Immunization History  Administered Date(s) Administered   Influenza Inj Mdck Quad Pf 02/16/2017   Influenza Inj Mdck Quad With Preservative 02/16/2018, 03/01/2019   Influenza, High Dose Seasonal PF 02/24/2021, 02/02/2023   Influenza,inj,Quad PF,6+ Mos 03/01/2017   Influenza,inj,quad, With Preservative 02/18/2015   Influenza-Unspecified 03/01/2017, 12/17/2019, 02/24/2021, 02/22/2022   Moderna Covid-19 Vaccine  Bivalent Booster 40yrs & up 09/18/2021, 02/16/2023   PFIZER(Purple Top)SARS-COV-2 Vaccination 07/22/2019, 08/12/2019, 03/10/2020, 11/10/2020, 01/20/2021   PPD Test 01/28/2021, 07/17/2021, 07/30/2021   Pfizer Covid-19 Vaccine Bivalent Booster 65yrs & up 03/04/2022   Pneumococcal Conjugate-13 04/18/2016   Pneumococcal Polysaccharide-23 05/03/2004   Pneumococcal-Unspecified 05/03/2004   Respiratory Syncytial Virus Vaccine,Recomb Aduvanted(Arexvy) 04/30/2022   Td 08/11/2009, 10/19/2019   Td (Adult),unspecified 10/19/2019   Tdap 08/11/2009   Zoster, Live 04/14/2012   Pertinent  Health Maintenance Due  Topic Date Due   Colonoscopy  01/09/2024   INFLUENZA VACCINE  12/02/2023   MAMMOGRAM  11/28/2024   DEXA SCAN  Completed      02/26/2022    1:30 PM 07/14/2022    9:17 AM 07/27/2022   12:32 PM 02/16/2023    9:40 AM 07/18/2023    9:47 AM  Fall Risk  Falls in the past year? 0 0 0 0 0  Was there an injury with Fall? 0 0 0 0   Fall Risk Category Calculator 0 0 0 0   Fall Risk Category (Retired) Low       (RETIRED) Patient Fall Risk Level High fall risk       Patient at Risk for Falls Due to  History of  fall(s) History of fall(s)  Impaired balance/gait;Impaired mobility  Fall risk Follow up Falls evaluation completed  Falls evaluation completed Falls evaluation completed Falls evaluation completed      Data  saved with a previous flowsheet row definition   Functional Status Survey:    Vitals:   11/28/23 0938  BP: 114/64  Pulse: 80  Resp: 20  Temp: 97.8 F (36.6 C)  SpO2: 98%  Weight: 169 lb (76.7 kg)  Height: 5' 5 (1.651 m)   Body mass index is 28.12 kg/m. Physical Exam Constitutional:      Appearance: Normal appearance.  HENT:     Head: Normocephalic and atraumatic.  Cardiovascular:     Rate and Rhythm: Normal rate and regular rhythm.  Pulmonary:     Effort: Pulmonary effort is normal. No respiratory distress.     Breath sounds: Normal breath sounds. No wheezing.  Abdominal:     General: Bowel sounds are normal. There is no distension.     Tenderness: There is no abdominal tenderness. There is no guarding or rebound.     Comments:    Neurological:     Mental Status: She is alert.     Comments: Residual weakness on Rt side      Labs reviewed: Recent Labs    06/21/23 0000  NA 139  K 4.2  CL 110*  CO2 23*  BUN 32*  CALCIUM  8.8   Recent Labs    06/21/23 0000  AST 13  ALT 14  ALKPHOS 128*  ALBUMIN 4.1   Recent Labs    06/21/23 0000  WBC 5.6  NEUTROABS 3,534.00  HGB 14.0  HCT 43  PLT 271   Lab Results  Component Value Date   TSH 3.039 07/15/2021   Lab Results  Component Value Date   HGBA1C 5.7 06/21/2023   Lab Results  Component Value Date   CHOL 146 06/21/2023   HDL 46 06/21/2023   LDLCALC 76 06/21/2023   LDLDIRECT 66 07/01/2022   TRIG 142 06/21/2023   CHOLHDL 2.7 07/09/2021    Significant Diagnostic Results in last 30 days:  No results found.  Assessment/Plan   Rt hemiparesis  Residual weakness on Rt side Cont with baclofen    Seizures  Cont with topiramte Will check labs next week   CKD  Lab Results  Component Value  Date   CREATININE 1.2 (A) 08/07/2021   CREATININE 0.76 07/17/2021   CREATININE 0.83 07/16/2021   Avoid nephrotoxic meds Increase oral hydration   HLD Cont with crestor   Lipid Panel     Component Value Date/Time   CHOL 146 06/21/2023 0000   CHOL 158 06/17/2021 1132   TRIG 142 06/21/2023 0000   HDL 46 06/21/2023 0000   HDL 63 06/17/2021 1132   CHOLHDL 2.7 07/09/2021 0456   VLDL 15 07/09/2021 0456   LDLCALC 76 06/21/2023 0000   LDLCALC 80 06/17/2021 1132   LDLCALC 101 (H) 04/03/2021 1003   LDLDIRECT 66 07/01/2022 1104   LABVLDL 15 06/17/2021 1132     GERD  Cont with omeprazole  Allergies  Cont with allegra  HTN  Cont with lisinopril      30 min Total time spent for obtaining history,  performing a medically appropriate examination and evaluation, reviewing the tests,   documenting clinical information in the electronic or other health record,  care coordination (not separately reported)

## 2023-11-30 ENCOUNTER — Ambulatory Visit
Admission: RE | Admit: 2023-11-30 | Discharge: 2023-11-30 | Disposition: A | Discharge status: Home / Self Care | Payer: PRIVATE HEALTH INSURANCE | Source: Ambulatory Visit | Attending: Sports Medicine | Admitting: Sports Medicine

## 2023-11-30 DIAGNOSIS — Z1231 Encounter for screening mammogram for malignant neoplasm of breast: Secondary | ICD-10-CM

## 2023-12-01 ENCOUNTER — Encounter: Payer: Self-pay | Admitting: Sports Medicine

## 2023-12-20 ENCOUNTER — Non-Acute Institutional Stay: Admitting: Nurse Practitioner

## 2023-12-20 ENCOUNTER — Encounter: Payer: Self-pay | Admitting: Nurse Practitioner

## 2023-12-20 DIAGNOSIS — G40909 Epilepsy, unspecified, not intractable, without status epilepticus: Secondary | ICD-10-CM | POA: Diagnosis not present

## 2023-12-20 DIAGNOSIS — G3184 Mild cognitive impairment, so stated: Secondary | ICD-10-CM | POA: Diagnosis not present

## 2023-12-20 DIAGNOSIS — I10 Essential (primary) hypertension: Secondary | ICD-10-CM

## 2023-12-20 DIAGNOSIS — R269 Unspecified abnormalities of gait and mobility: Secondary | ICD-10-CM

## 2023-12-20 NOTE — Assessment & Plan Note (Signed)
 difficulty word findings/forming sentences. 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 MMSE 28/30 08/30/23

## 2023-12-20 NOTE — Assessment & Plan Note (Signed)
 Loose Bp control in setting of gait abnormality, risk of falling, on Lisinopril

## 2023-12-20 NOTE — Assessment & Plan Note (Signed)
 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 takes Baclofen  nightly.  patient ambulated with walker, R ankle brace.  a mechanical fall in the hallway near [facility], reported hit her head on the floor, resulted in the left wrist contusion, able to range of motion and make a fist. Closer supervision and assistant as needed

## 2023-12-20 NOTE — Progress Notes (Unsigned)
 Location:   [facility] Room Number: JO180-J Place of Service:  ALF (13) Provider: Larwance Harrold Fitchett NP  Nasia Cannan X, NP  Patient Care Team: Torie Towle X, NP as PCP - General (Internal Medicine) Santo Stanly LABOR, MD as PCP - Cardiology (Cardiology) Georjean Darice HERO, MD as Consulting Physician (Neurology) Prescilla Beams, MD as Consulting Physician (Nephrology) Renda Glance, MD as Consulting Physician (Urology)  Extended Emergency Contact Information Primary Emergency Contact: Devra Sari Morita, [place] Home Phone: [phone] Mobile Phone: [phone] Relation: Cousin Secondary Emergency Contact: Roper,Paula Address: [address]          [place], [place] [address] [place] Home Phone: [phone] Mobile Phone: [phone] Relation: Sister  Code Status: DNR Goals of care: Advanced Directive information    11/28/2023    9:45 AM  Advanced Directives  Does Patient Have a Medical Advance Directive? Yes  Type of Estate agent of [place];Living will;Out of facility DNR (pink MOST or yellow form)  Does patient want to make changes to medical advance directive? No - Patient declined  Copy of Healthcare Power of Attorney in Chart? Yes - validated most recent copy scanned in chart (See row information)     Chief Complaint  Patient presents with  . Fall    fall    HPI:  Pt is a 68 y.o. female seen today for an acute visit for a mechanical fall in the hallway near [place], reported hit her head on the floor, resulted in the left wrist contusion, able to range of motion and make a fist.   Elevated alk phos 173 2/29/42>>154(37-153) Constipation, stable, prn MiraLax              R hip pain, on Tylenol  for pain, R hip ORIF 07/09/21             Dysphagia 2, thin liquid.              GERD, stable             Hypokalemia, K 4.2 06/21/23             CAD, no angina. F/u Cardiology.               HTN, controlled, on Lisinopril             HLD LDL 76 06/21/23, Cardiology Rosuvastatin              Impaired cognition, difficulty word findings/forming sentences. 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 MMSE 28/30 08/30/23 Migraines, no flare ups since 2017, Tylenol , Zanaflex , treated with steroids in the past.              Pulmonary nodules, f/u Pulmonology, R upper lobe nodule, had bronchoscopy 04/21/21-negative for malignancy or infection.              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 takes Baclofen  nightly.              Hx of cerebral AVMs  CKD III, chronic             Gait abnormality, patient ambulated with walker, R ankle brace.              Anemia, Hgb 14 06/21/23       Past Medical History:  Diagnosis Date  . Acute kidney injury (HCC) 09/03/2019   05-2019 kidney stone removed  . Allergy    seasonal allergies  . Arthritis    LEFT hand  . AVM (arteriovenous malformation) brain   . Cataract    bilateral-not a surgical candidate at this time (07/23/2020)  . COVID-19 virus infection 08/2020  . GERD (gastroesophageal reflux disease)    on meds  . Hyperlipidemia    diet controlled  . Insulin  resistance 02/16/2018  . Seizures (HCC)    Grand Mal & Partial complex seizure disorder-last 1998  . Stroke [facility])    5 total so far (age [phone])  . Ureteral stone 05/10/2019   has multiple kidney stones noted from Urologist  . Uses roller walker    Past Surgical History:  Procedure Laterality Date  . BRAIN SURGERY  09/19/1978   at [facility], Dr. Alm Sor  . BRONCHIAL BIOPSY  04/21/2021   Procedure: BRONCHIAL BIOPSIES;  Surgeon: Brenna Adine CROME, DO;  Location: [facility];  Service: Pulmonary;;  . BRONCHIAL BRUSHINGS  04/21/2021   Procedure: BRONCHIAL BRUSHINGS;  Surgeon: Brenna Adine CROME, DO;  Location: [facility];  Service: Pulmonary;;  . BRONCHIAL WASHINGS  04/21/2021   Procedure: BRONCHIAL WASHINGS;  Surgeon: Brenna Adine CROME, DO;  Location: [facility];  Service: Pulmonary;;  . COLONOSCOPY  2017   [place]  . CYSTOSCOPY/URETEROSCOPY/HOLMIUM LASER/STENT PLACEMENT Left 05/10/2019   Procedure: CYSTOSCOPY/RETROGRADE/URETEROSCOPY/HOLMIUM LASER/STENT PLACEMENT;  Surgeon: Renda Glance, MD;  Location: [facility];  Service: Urology;  Laterality: Left;  . FIDUCIAL MARKER PLACEMENT  04/21/2021   Procedure: FIDUCIAL MARKER PLACEMENT;  Surgeon: Brenna Adine CROME, DO;  Location: [facility];  Service: Pulmonary;;  . FOOT SURGERY Right 10/1985   ankle fusion, at [facility], Dr. Janeece  . INTRAMEDULLARY (IM) NAIL INTERTROCHANTERIC Right 07/09/2021   Procedure: INTRAMEDULLARY (IM) NAIL INTERTROCHANTRIC;  Surgeon: Fidel Rogue, MD;  Location: [facility];  Service: Orthopedics;  Laterality: Right;  . POLYPECTOMY  2017   multiple adenomas  . TUBAL LIGATION  1981  . VIDEO BRONCHOSCOPY WITH RADIAL ENDOBRONCHIAL ULTRASOUND  04/21/2021   Procedure: RADIAL ENDOBRONCHIAL ULTRASOUND;  Surgeon: Brenna Adine CROME, DO;  Location: [facility];  Service: Pulmonary;;  . WISDOM TOOTH EXTRACTION      Allergies  Allergen Reactions  . Aspirin Other (See Comments)    CONTRAINDICATED DUE TO HISTORY OF BLEEDING IN BRAIN  . Cheese     Cant take due to history of severe migraines  . Chocolate     Cant take due to history of severe migraines  . Coffee Flavoring Agent (Non-Screening)     Cant take due to history of severe migraines  . Other      Nuts - Cant take due to history of severe migraines  . Red Wine Complex [Germanium]     Cant take due to history of severe migraines    Allergies as of 12/20/2023       Reactions   Aspirin Other (See Comments)    CONTRAINDICATED DUE TO HISTORY OF BLEEDING IN BRAIN   Cheese    Cant take due to history of severe migraines   Chocolate    Cant take due to history of severe  migraines   Coffee Flavoring Agent (non-screening)    Cant take due to history of severe migraines   Other     Nuts - Cant take due to history of severe migraines   Red Wine Complex [germanium]    Cant take due to history of severe migraines        Medication List        Accurate as of December 20, 2023 11:59 PM. If you have any questions, ask your nurse or doctor.          acetaminophen  325 MG tablet Commonly known as: TYLENOL  Take 650 mg by mouth every 4 (four) hours as needed for headache or mild pain.   baclofen  10 MG tablet Commonly known as: LIORESAL  Take 10 mg by mouth at bedtime.   butalbital -acetaminophen -caffeine  50-325-40 MG tablet Commonly known as: FIORICET Take 1 tablet once a day as needed for severe migraine. Do not take more than 3 a week.   Calcium  Carbonate 500 MG Chew Chew 500 mg by mouth in the morning and at bedtime.   fexofenadine 180 MG tablet Commonly known as: ALLEGRA Take 180 mg by mouth daily.   fexofenadine 60 MG tablet Commonly known as: ALLEGRA Take 60 mg by mouth daily.   guaiFENesin 600 MG 12 hr tablet Commonly known as: MUCINEX Take 600 mg by mouth 2 (two) times daily as needed.   lisinopril 5 MG tablet Commonly known as: ZESTRIL Take 5 mg by mouth daily.   nystatin  powder Commonly known as: MYCOSTATIN /NYSTOP  Apply 1 application. topically 2 (two) times daily as needed (rash under breast).   OMEGA-3 FISH OIL PO Take 1 capsule by mouth daily.   omeprazole 20 MG capsule Commonly known as: PRILOSEC Take 20 mg by mouth daily.   ondansetron  4 MG tablet Commonly known as: ZOFRAN  Take 4 mg by mouth every 6 (six) hours as needed for nausea or vomiting.   oseltamivir 30 MG capsule Commonly known as: TAMIFLU Take 30 mg by mouth 2 (two) times daily.    polyethylene glycol 17 g packet Commonly known as: MIRALAX  / GLYCOLAX  Take 17 g by mouth daily as needed for mild constipation.   rosuvastatin  10 MG tablet Commonly known as: CRESTOR  Take 1 tablet (10 mg total) by mouth daily.   Systane Balance 0.6 % Soln Generic drug: Propylene Glycol Place 1 drop into both eyes 2 (two) times daily.   Therems-M Tabs Take 1 tablet by mouth daily.   tiZANidine  2 MG tablet Commonly known as: ZANAFLEX  Take 1 tablet as needed once a day for headache (Give Tizanidine  instead of Tylenol )   topiramate  100 MG tablet Commonly known as: TOPAMAX  Take 300 mg by mouth 2 (two) times daily.   vitamin D3 25 MCG tablet Commonly known as: CHOLECALCIFEROL  Take 1,000 Units by mouth daily.   zinc oxide 20 % ointment Apply 1 application. topically as needed (after every incontinent episode).        Review of Systems  Constitutional:  Negative for activity change, fatigue and fever.  HENT:  Positive for trouble swallowing. Negative for congestion, rhinorrhea and sore throat.   Eyes:  Negative for visual disturbance.       Lateral right peripheral vision lost  Respiratory:  Negative for cough, shortness of breath and wheezing.   Cardiovascular:  Positive for leg swelling.  Gastrointestinal:  Negative for abdominal pain and constipation.  Genitourinary:  Positive for frequency. Negative for dysuria and urgency.       Chronic  Musculoskeletal:  Positive for arthralgias, gait problem and joint swelling.       R hip pain, better. Right ankle brace. R wrist contracture.   Skin:  Negative for color change.  Neurological:  Positive for speech difficulty and weakness. Negative for seizures and headaches.       Difficulty words finding, memory lapses.   Psychiatric/Behavioral:  Negative for behavioral problems and sleep disturbance. The patient is not nervous/anxious.     Immunization History  Administered Date(s) Administered  . Influenza Inj Mdck Quad Pf  02/16/2017  . Influenza Inj Mdck Quad With Preservative 02/16/2018, 03/01/2019  . Influenza, High Dose Seasonal PF 02/24/2021, 02/02/2023  . Influenza,inj,Quad PF,6+ Mos 03/01/2017  . Influenza,inj,quad, With Preservative 02/18/2015  . Influenza-Unspecified 03/01/2017, 12/17/2019, 02/24/2021, 02/22/2022  . Moderna Covid-19 Vaccine  Bivalent Booster 4yrs & up 09/18/2021, 02/16/2023  . PFIZER(Purple Top)SARS-COV-2 Vaccination 07/22/2019, 08/12/2019, 03/10/2020, 11/10/2020, 01/20/2021  . PPD Test 01/28/2021, 07/17/2021, 07/30/2021  . Pfizer Covid-19 Vaccine Bivalent Booster 69yrs & up 03/04/2022  . Pneumococcal Conjugate-13 04/18/2016  . Pneumococcal Polysaccharide-23 05/03/2004  . Pneumococcal-Unspecified 05/03/2004  . Respiratory Syncytial Virus Vaccine,Recomb Aduvanted(Arexvy) 04/30/2022  . Td 08/11/2009, 10/19/2019  . Td (Adult),unspecified 10/19/2019  . Tdap 08/11/2009  . Zoster, Live 04/14/2012   Pertinent  Health Maintenance Due  Topic Date Due  . INFLUENZA VACCINE  12/02/2023  . Colonoscopy  01/09/2024  . MAMMOGRAM  11/29/2025  . DEXA SCAN  Completed      02/26/2022    1:30 PM 07/14/2022    9:17 AM 07/27/2022   12:32 PM 02/16/2023    9:40 AM 07/18/2023    9:47 AM  Fall Risk  Falls in the past year? 0 0 0 0 0  Was there an injury with Fall? 0 0 0 0   Fall Risk Category Calculator 0 0 0 0   Fall Risk Category (Retired) Low       (RETIRED) Patient Fall Risk Level High fall risk       Patient at Risk for Falls Due to  History of fall(s) History of fall(s)  Impaired balance/gait;Impaired mobility  Fall risk Follow up Falls evaluation completed  Falls evaluation completed Falls evaluation completed Falls evaluation completed      Data saved with a previous flowsheet row definition   Functional Status Survey:    Vitals:   12/20/23 1428 12/21/23 1056  BP: (!) 150/76 (!) 150/76  Pulse: 78   Resp: 20   Temp: (!) 97.5 F (36.4 C)   SpO2: 98%   Weight: 169 lb 6.4 oz (76.8  kg)   Height: 5' 5 (1.651 m)    Body mass index is 28.19 kg/m. Physical Exam Vitals and nursing note reviewed.  Constitutional:      Comments: fatigue  HENT:     Head: Normocephalic and atraumatic.     Nose: Nose normal.     Mouth/Throat:     Mouth: Mucous membranes are moist.  Eyes:     Extraocular Movements: Extraocular movements intact.     Pupils: Pupils are equal, round, and reactive to light.  Cardiovascular:     Rate and Rhythm: Normal rate and regular rhythm.     Heart sounds: No murmur heard. Pulmonary:     Effort: Pulmonary effort is normal.     Breath sounds: No rales.  Abdominal:     General: Bowel sounds are normal.     Palpations: Abdomen is soft.     Tenderness: There is no abdominal tenderness.  Musculoskeletal:  General: Tenderness and deformity present.     Cervical back: Normal range of motion and neck supple.     Right lower leg: Edema present.     Left lower leg: Edema present.     Comments: Right wrist contracture, right ankle brace. Trace edema BLE Left wrist tenderness/discomfort with range of motion, mild redness and warmth, no skin breakdown  Skin:    General: Skin is warm and dry.  Neurological:     General: No focal deficit present.     Mental Status: She is alert and oriented to person, place, and time. Mental status is at baseline.     Cranial Nerves: No cranial nerve deficit.     Motor: Weakness present.     Coordination: Coordination abnormal.     Gait: Gait abnormal.     Comments: Right wrist contracture, muscle strength 1/5 RUE, 4-5 RLE. Uses R ankle brace.  Psychiatric:        Mood and Affect: Mood normal.        Behavior: Behavior normal.        Thought Content: Thought content normal.     Labs reviewed: Recent Labs    06/21/23 0000  NA 139  K 4.2  CL 110*  CO2 23*  BUN 32*  CALCIUM  8.8   Recent Labs    06/21/23 0000  AST 13  ALT 14  ALKPHOS 128*  ALBUMIN 4.1   Recent Labs    06/21/23 0000  WBC 5.6   NEUTROABS 3,534.00  HGB 14.0  HCT 43  PLT 271   Lab Results  Component Value Date   TSH 3.039 07/15/2021   Lab Results  Component Value Date   HGBA1C 5.7 06/21/2023   Lab Results  Component Value Date   CHOL 146 06/21/2023   HDL 46 06/21/2023   LDLCALC 76 06/21/2023   LDLDIRECT 66 07/01/2022   TRIG 142 06/21/2023   CHOLHDL 2.7 07/09/2021    Significant Diagnostic Results in last 30 days:  MM 3D SCREENING MAMMOGRAM BILATERAL BREAST Result Date: 12/02/2023 CLINICAL DATA:  Screening. EXAM: DIGITAL SCREENING BILATERAL MAMMOGRAM WITH TOMOSYNTHESIS AND CAD TECHNIQUE: Bilateral screening digital craniocaudal and mediolateral oblique mammograms were obtained. Bilateral screening digital breast tomosynthesis was performed. The images were evaluated with computer-aided detection. COMPARISON:  Previous exam(s). ACR Breast Density Category b: There are scattered areas of fibroglandular density. FINDINGS: There are no findings suspicious for malignancy. IMPRESSION: No mammographic evidence of malignancy. A result letter of this screening mammogram will be mailed directly to the patient. RECOMMENDATION: Screening mammogram in one year. (Code:SM-B-01Y) BI-RADS CATEGORY  1: Negative. Electronically Signed   By: Dina  Arceo M.D.   On: 12/02/2023 14:30    Assessment/Plan: Gait abnormality R hemiparesis, lateral right peripheral vision deficit, right wrist contracture, as an result of a hemorrhagic stroke at age of 67 and subsequent strokes x5 per patient. takes Baclofen  nightly.  patient ambulated with walker, R ankle brace.  a mechanical fall in the hallway near [facility], reported hit her head on the floor, resulted in the left wrist contusion, able to range of motion and make a fist. Closer supervision and assistant as needed  HTN (hypertension) Loose Bp control in setting of gait abnormality, risk of falling, on Lisinopril  Mild cognitive impairment  difficulty word findings/forming  sentences. 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

## 2023-12-20 NOTE — Assessment & Plan Note (Signed)
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

## 2024-01-17 ENCOUNTER — Encounter: Payer: Self-pay | Admitting: Gastroenterology

## 2024-01-22 NOTE — Progress Notes (Unsigned)
 Cardiology Office Note   Date:  01/23/2024  ID:  Tonya Thompson, DOB 03-15-1956, MRN [id number] PCP: Mast, Man X, NP  [facility] Providers Cardiologist:  Stanly DELENA Leavens, MD   History of Present Illness Tonya Thompson is a 68 y.o. 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  Her father unfortunately passed away in 01/31/22.  Patient followed up in 02-01-23 with Delon Hoover, NP and her rosuvastatin  was briefly held for query of transaminitis.  At her last office visit 01/24/2023 she noted she was doing well.  Since her last visit she went back on cholesterol medication with no issues.  No interval hospital/ED visits back then.  Noted that she was playing cribbage and still in friend's home.  Her knee brace was the biggest detriment to her quality of life back then.  She did not have any chest pain/pressure.  No SOB/DOE.  No PND/orthopnea.  Slight leg swelling on the left side.  No palpitations or syncope.  Today, she is here for cardiac follow-up.   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 An eleven-hour brain surgery was performed at age [age] to remove part of the malformed veins, but she experienced another stroke at age 68. She has no current symptoms of palpitations, chest pain, or shortness of breath, and no sensation of racing or skipping beats.  Her medications include lisinopril  5 mg for blood pressure and rosuvastatin  for cholesterol. She follows a diet low in sodium and red meat, focusing on fish, [place], chicken, and broccoli.  Recent lab work from February 2023 showed an LDL of 76, triglycerides within normal limits, a hemoglobin A1c of 5.7, and a GFR of 48.  Reports no shortness  of breath nor dyspnea on exertion. Reports no chest pain, pressure, or tightness. No edema, orthopnea, PND. Reports no palpitations.   Discussed the use of AI scribe software for clinical note transcription with the patient, who gave verbal consent to proceed.   ROS: Pertinent ROS in HPI  Studies Reviewed      ECHOCARDIOGRAM   ECHOCARDIOGRAM COMPLETE 07/09/2021   Narrative ECHOCARDIOGRAM REPORT       Patient Name:   Tonya Thompson Date of Exam: 07/09/2021 Medical Rec #:  [id number]        Height:       65.5 in Accession #:    [id number]       Weight:       144.0 lb Date of Birth:  Apr 02, 1956        BSA:          1.730 m Patient Age:    65 years         BP:           117/77 mmHg Patient Gender: F                HR:           108 bpm. Exam Location:  Inpatient   Procedure: 2D Echo, Color Doppler and Cardiac Doppler   Indications:    Preop   History:        Patient has no prior history of Echocardiogram examinations.   Sonographer:    Waddell Captain Referring Phys: [id number] RAMESH KC  IMPRESSIONS     1. Left ventricular ejection fraction, by estimation, is 55 to 60%. The left ventricle has normal function. Left ventricular endocardial border not optimally defined to evaluate regional wall motion. There is mild concentric left ventricular hypertrophy. Left ventricular diastolic parameters are consistent with Grade I diastolic dysfunction (impaired relaxation). 2. Right ventricular systolic function was not well visualized. The right ventricular size is normal. There is normal pulmonary artery systolic pressure. 3. The mitral valve is grossly normal. No evidence of mitral valve regurgitation. 4. The aortic valve is tricuspid. Aortic valve regurgitation is not visualized. No aortic stenosis is present. 5. The inferior vena cava is normal in size with greater than 50% respiratory variability, suggesting right atrial pressure of 3 mmHg.   Comparison(s): No prior Echocardiogram.    Conclusion(s)/Recommendation(s): In future studies would perform with echo contrast.   FINDINGS Left Ventricle: Left ventricular ejection fraction, by estimation, is 55 to 60%. The left ventricle has normal function. Left ventricular endocardial border not optimally defined to evaluate regional wall motion. The left ventricular internal cavity size was small. There is mild concentric left ventricular hypertrophy. Left ventricular diastolic parameters are consistent with Grade I diastolic dysfunction (impaired relaxation).   Right Ventricle: The right ventricular size is normal. Right vetricular wall thickness was not well visualized. Right ventricular systolic function was not well visualized. There is normal pulmonary artery systolic pressure. The tricuspid regurgitant velocity is 2.52 m/s, and with an assumed right atrial pressure of 3 mmHg, the estimated right ventricular systolic pressure is 28.4 mmHg.   Left Atrium: Left atrial size was normal in size.   Right Atrium: Right atrial size was normal in size.   Pericardium: Trivial pericardial effusion is present.   Mitral Valve: The mitral valve is grossly normal. No evidence of mitral valve regurgitation.   Tricuspid Valve: The tricuspid valve is normal in structure. Tricuspid valve regurgitation is mild . No evidence of tricuspid stenosis.   Aortic Valve: The aortic valve is tricuspid. Aortic valve regurgitation is not visualized. No aortic stenosis is present. Aortic valve peak gradient measures 3.8 mmHg.   Pulmonic Valve: The pulmonic valve was normal in structure. Pulmonic valve regurgitation is not visualized. No evidence of pulmonic stenosis.   Aorta: The aortic root and ascending aorta are structurally normal, with no evidence of dilitation.   Venous: The inferior vena cava is normal in size with greater than 50% respiratory variability, suggesting right atrial pressure of 3 mmHg.   IAS/Shunts: No atrial level shunt detected by  color flow Doppler.     LEFT VENTRICLE PLAX 2D LVIDd:         3.10 cm     Diastology LVIDs:         2.40 cm     LV e' medial:    3.92 cm/s LV PW:         1.20 cm     LV E/e' medial:  12.2 LV IVS:        1.00 cm     LV e' lateral:   7.18 cm/s LVOT diam:     1.80 cm     LV E/e' lateral: 6.7 LV SV:         38 LV SV Index:   22 LVOT Area:     2.54 cm   LV Volumes (MOD) LV vol d, MOD A2C: 44.7 ml LV vol d, MOD A4C: 43.2 ml LV vol s, MOD A2C: 18.4 ml LV vol s, MOD A4C: 18.3  ml LV SV MOD A2C:     26.3 ml LV SV MOD A4C:     43.2 ml LV SV MOD BP:      25.4 ml   RIGHT VENTRICLE            IVC RV S prime:     9.79 cm/s  IVC diam: 1.20 cm TAPSE (M-mode): 1.4 cm   LEFT ATRIUM             Index LA diam:        1.80 cm 1.04 cm/m LA Vol (A2C):   6.8 ml  3.93 ml/m LA Vol (A4C):   6.6 ml  3.79 ml/m LA Biplane Vol: 7.0 ml  4.06 ml/m AORTIC VALVE AV Area (Vmax): 2.53 cm AV Vmax:        97.30 cm/s AV Peak Grad:   3.8 mmHg LVOT Vmax:      96.60 cm/s LVOT Vmean:     68.500 cm/s LVOT VTI:       0.151 m   AORTA Ao Root diam: 3.10 cm Ao Asc diam:  2.80 cm   MITRAL VALVE               TRICUSPID VALVE MV Area (PHT): 6.37 cm    TR Peak grad:   25.4 mmHg MV Decel Time: 119 msec    TR Vmax:        252.00 cm/s MV E velocity: 48.00 cm/s MV A velocity: 85.30 cm/s  SHUNTS MV E/A ratio:  0.56        Systemic VTI:  0.15 m Systemic Diam: 1.80 cm   Stanly Leavens MD Electronically signed by Stanly Leavens MD Signature Date/Time: 07/09/2021/11:34:14 AM   Final  Physical Exam VS:  BP 106/64   Pulse 95   Ht 5' 5 (1.651 m)   Wt 175 lb 9.6 oz (79.7 kg)   SpO2 96%   BMI 29.22 kg/m        Wt Readings from Last 3 Encounters:  01/23/24 175 lb 9.6 oz (79.7 kg)  12/20/23 169 lb 6.4 oz (76.8 kg)  11/28/23 169 lb (76.7 kg)    GEN: Well nourished, well developed in no acute distress NECK: No JVD; No carotid bruits CARDIAC: RRR, no murmurs, rubs, gallops RESPIRATORY:  Clear to  auscultation without rales, wheezing or rhonchi  ABDOMEN: Soft, non-tender, non-distended EXTREMITIES:  No edema; No deformity   ASSESSMENT AND PLAN  Atherosclerotic heart disease of native coronary artery without angina pectoris Heart rate normal sinus rhythm. No symptoms. LDL cholesterol at 76 mg/dL, near target for coronary disease. - Continue lisinopril  and rosuvastatin .  Mixed hyperlipidemia LDL cholesterol at 76 mg/dL. Triglycerides normal. Diet low in sodium and sweets, prefers white meats and fish. - Continue rosuvastatin . - Maintain dietary habits.  Chronic kidney disease, stage 3a GFR 48 mL/min/1.73 m. Potassium normal. - Annual kidney function tests in February.  Chronic left lower extremity edema Mild edema improved. Elevation with lift chair effective. - Continue using lift chair for leg elevation.  History of CVA - Chronic right sided weakness, she had to learn how to write and use her left hand -Right brace to help with walking, uses a walker     Dispo: She can follow-up in a year with MD  Signed, Orren LOISE Fabry, PA-C

## 2024-01-23 ENCOUNTER — Encounter: Payer: Self-pay | Admitting: Physician Assistant

## 2024-01-23 ENCOUNTER — Ambulatory Visit: Payer: PRIVATE HEALTH INSURANCE | Attending: Physician Assistant | Admitting: Physician Assistant

## 2024-01-23 VITALS — BP 106/64 | HR 95 | Ht 65.0 in | Wt 175.6 lb

## 2024-01-23 DIAGNOSIS — Z8673 Personal history of transient ischemic attack (TIA), and cerebral infarction without residual deficits: Secondary | ICD-10-CM

## 2024-01-23 DIAGNOSIS — R0989 Other specified symptoms and signs involving the circulatory and respiratory systems: Secondary | ICD-10-CM | POA: Diagnosis not present

## 2024-01-23 DIAGNOSIS — E782 Mixed hyperlipidemia: Secondary | ICD-10-CM

## 2024-01-23 DIAGNOSIS — I2510 Atherosclerotic heart disease of native coronary artery without angina pectoris: Secondary | ICD-10-CM | POA: Diagnosis not present

## 2024-01-23 MED ORDER — LISINOPRIL 5 MG PO TABS: 5.0000 mg | ORAL_TABLET | Freq: Every day | ORAL | 3 refills | Status: AC

## 2024-01-23 MED ORDER — ROSUVASTATIN CALCIUM 10 MG PO TABS: 10.0000 mg | ORAL_TABLET | Freq: Every day | ORAL | 3 refills | Status: AC

## 2024-01-23 NOTE — Patient Instructions (Signed)
 Medication Instructions:  Your physician recommends that you continue on your current medications as directed. Please refer to the Current Medication list given to you today.  *If you need a refill on your cardiac medications before your next appointment, please call your pharmacy*  Lab Work: NONE ordered at this time of appointment   Testing/Procedures: NONE ordered at this time of appointment    Follow-Up: At [facility], you and your health needs are our priority.  As part of our continuing mission to provide you with exceptional heart care, our providers are all part of one team.  This team includes your primary Cardiologist (physician) and Advanced Practice Providers or APPs (Physician Assistants and Nurse Practitioners) who all work together to provide you with the care you need, when you need it.  Your next appointment:   1 year(s)  Provider:   Jann Melody, MD    We recommend signing up for the patient portal called "MyChart".  Sign up information is provided on this After Visit Summary.  MyChart is used to connect with patients for Virtual Visits (Telemedicine).  Patients are able to view lab/test results, encounter notes, upcoming appointments, etc.  Non-urgent messages can be sent to your provider as well.   To learn more about what you can do with MyChart, go to [email or web address].

## 2024-01-31 ENCOUNTER — Encounter: Payer: Self-pay | Admitting: Nurse Practitioner

## 2024-01-31 ENCOUNTER — Non-Acute Institutional Stay: Admitting: Nurse Practitioner

## 2024-01-31 DIAGNOSIS — G3184 Mild cognitive impairment, so stated: Secondary | ICD-10-CM | POA: Diagnosis not present

## 2024-01-31 DIAGNOSIS — R7989 Other specified abnormal findings of blood chemistry: Secondary | ICD-10-CM

## 2024-01-31 DIAGNOSIS — R918 Other nonspecific abnormal finding of lung field: Secondary | ICD-10-CM

## 2024-01-31 DIAGNOSIS — G40909 Epilepsy, unspecified, not intractable, without status epilepticus: Secondary | ICD-10-CM

## 2024-01-31 DIAGNOSIS — G43909 Migraine, unspecified, not intractable, without status migrainosus: Secondary | ICD-10-CM

## 2024-01-31 DIAGNOSIS — R269 Unspecified abnormalities of gait and mobility: Secondary | ICD-10-CM

## 2024-01-31 DIAGNOSIS — I2510 Atherosclerotic heart disease of native coronary artery without angina pectoris: Secondary | ICD-10-CM

## 2024-01-31 DIAGNOSIS — M150 Primary generalized (osteo)arthritis: Secondary | ICD-10-CM

## 2024-01-31 DIAGNOSIS — G8191 Hemiplegia, unspecified affecting right dominant side: Secondary | ICD-10-CM

## 2024-01-31 DIAGNOSIS — I10 Essential (primary) hypertension: Secondary | ICD-10-CM | POA: Diagnosis not present

## 2024-01-31 DIAGNOSIS — E782 Mixed hyperlipidemia: Secondary | ICD-10-CM

## 2024-01-31 DIAGNOSIS — R7303 Prediabetes: Secondary | ICD-10-CM | POA: Insufficient documentation

## 2024-01-31 NOTE — Assessment & Plan Note (Signed)
patient ambulated with walker, R ankle brace.

## 2024-01-31 NOTE — Progress Notes (Unsigned)
 Location:   [facility] Room Number: 819 Place of Service:  ALF (13) Provider: Larwance Mylie Mccurley NP  Harriet Sutphen X, NP  Patient Care Team: Audianna Landgren X, NP as PCP - General (Internal Medicine) Santo Stanly LABOR, MD as PCP - Cardiology (Cardiology) Georjean Darice HERO, MD as Consulting Physician (Neurology) Prescilla Beams, MD as Consulting Physician (Nephrology) Renda Glance, MD as Consulting Physician (Urology)  Extended Emergency Contact Information Primary Emergency Contact: Devra Sari Morita, [place] Home Phone: [phone] Mobile Phone: [phone] Relation: Cousin Secondary Emergency Contact: Roper,Paula Address: [address]          [place], [place] [address] [place] Home Phone: [phone] Mobile Phone: [phone] Relation: Sister  Code Status:  DNR Goals of care: Advanced Directive information    11/28/2023    9:45 AM  Advanced Directives  Does Patient Have a Medical Advance Directive? Yes  Type of Estate agent of [place];Living will;Out of facility DNR (pink MOST or yellow form)  Does patient want to make changes to medical advance directive? No - Patient declined  Copy of Healthcare Power of Attorney in Chart? Yes - validated most recent copy scanned in chart (See row information)     Chief Complaint  Patient presents with   Medical Management of Chronic Issues    HPI:  Pt is a 68 y.o. female seen today for medical management of chronic diseases.     Elevated alk phos 173 2/29/42>>154(37-153) Constipation, stable, prn MiraLax              R hip pain, on Tylenol  for pain, R hip ORIF 07/09/21             Dysphagia 2, thin liquid.              GERD, stable             Hypokalemia, K 4.2 06/21/23             CAD, no angina. F/u Cardiology. 01/23/2024 heart care no medication changes follow-up 1 year             HTN, controlled, on Lisinopril              HLD LDL 76  06/21/23, Cardiology Rosuvastatin              Impaired cognition, difficulty word findings/forming sentences. 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 MMSE 28/30 08/30/23 Migraines, no flare ups since 2017, Tylenol , Zanaflex , treated with steroids in the past.              Pulmonary nodules, f/u Pulmonology, R upper lobe nodule, had bronchoscopy 04/21/21-negative for malignancy or infection. 07/18/23 pulmonology CT 08/10/23 stable in the past 3 years, f/u 5 years recommended.              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 takes Baclofen  nightly.              Hx of cerebral AVMs             CKD III, chronic  Gait abnormality, patient ambulated with walker, R ankle brace.              Anemia, Hgb 14 06/21/23  Prediabetes Hgb A1c 5.7 06/21/23, diet controlled.          Past Medical History:  Diagnosis Date   Acute kidney injury 09/03/2019   05-2019 kidney stone removed   Allergy    seasonal allergies   Arthritis    LEFT hand   AVM (arteriovenous malformation) brain    Cataract    bilateral-not a surgical candidate at this time (07/23/2020)   COVID-19 virus infection 08/2020   GERD (gastroesophageal reflux disease)    on meds   Hyperlipidemia    diet controlled   Insulin  resistance 02/16/2018   Seizures (HCC)    Grand Mal & Partial complex seizure disorder-last 1998   Stroke [facility])    5 total so far (age [phone])   Ureteral stone 05/10/2019   has multiple kidney stones noted from Urologist   Uses roller walker    Past Surgical History:  Procedure Laterality Date   BRAIN SURGERY  09/19/1978   at [facility], Dr. Alm Sor   BRONCHIAL BIOPSY   04/21/2021   Procedure: BRONCHIAL BIOPSIES;  Surgeon: Brenna Adine CROME, DO;  Location: [facility];  Service: Pulmonary;;   BRONCHIAL BRUSHINGS  04/21/2021   Procedure: BRONCHIAL BRUSHINGS;  Surgeon: Brenna Adine CROME, DO;  Location: [facility];  Service: Pulmonary;;   BRONCHIAL WASHINGS  04/21/2021   Procedure: BRONCHIAL WASHINGS;  Surgeon: Brenna Adine CROME, DO;  Location: [facility];  Service: Pulmonary;;   COLONOSCOPY  2017   [place], [place]   CYSTOSCOPY/URETEROSCOPY/HOLMIUM LASER/STENT PLACEMENT Left 05/10/2019   Procedure: CYSTOSCOPY/RETROGRADE/URETEROSCOPY/HOLMIUM LASER/STENT PLACEMENT;  Surgeon: Renda Glance, MD;  Location: [facility];  Service: Urology;  Laterality: Left;   FIDUCIAL MARKER PLACEMENT  04/21/2021   Procedure: FIDUCIAL MARKER PLACEMENT;  Surgeon: Brenna Adine CROME, DO;  Location: [facility];  Service: Pulmonary;;   FOOT SURGERY Right 10/1985   ankle fusion, at [facility], Dr. Janeece   INTRAMEDULLARY (IM) NAIL INTERTROCHANTERIC Right 07/09/2021   Procedure: INTRAMEDULLARY (IM) NAIL INTERTROCHANTRIC;  Surgeon: Fidel Rogue, MD;  Location: [facility];  Service: Orthopedics;  Laterality: Right;   POLYPECTOMY  2017   multiple adenomas   TUBAL LIGATION  1981   VIDEO BRONCHOSCOPY WITH RADIAL ENDOBRONCHIAL ULTRASOUND  04/21/2021   Procedure: RADIAL ENDOBRONCHIAL ULTRASOUND;  Surgeon: Brenna Adine CROME, DO;  Location: [facility];  Service: Pulmonary;;   WISDOM TOOTH EXTRACTION      Allergies  Allergen Reactions   Aspirin Other (See Comments)    CONTRAINDICATED DUE TO HISTORY OF BLEEDING IN BRAIN   Cheese     Cant take due to history of severe migraines   Chocolate     Cant take due to history of severe migraines   Coffee Flavoring Agent (Non-Screening)     Cant take due to history of severe migraines   Other      Nuts - Cant take due to history of severe migraines   Red Wine Complex [Germanium]     Cant take due to history of severe migraines    Allergies as of 01/31/2024        Reactions   Aspirin Other (See Comments)   CONTRAINDICATED DUE TO HISTORY OF BLEEDING IN BRAIN   Cheese    Cant take due to history of severe migraines   Chocolate    Cant take due to history of severe migraines   Coffee Flavoring  Agent (non-screening)    Cant take due to history of severe migraines   Other     Nuts - Cant take due to history of severe migraines   Red Wine Complex [germanium]    Cant take due to history of severe migraines        Medication List        Accurate as of January 31, 2024 11:59 PM. If you have any questions, ask your nurse or doctor.          acetaminophen  325 MG tablet Commonly known as: TYLENOL  Take 650 mg by mouth every 4 (four) hours as needed for headache or mild pain.   baclofen  10 MG tablet Commonly known as: LIORESAL  Take 10 mg by mouth at bedtime.   butalbital -acetaminophen -caffeine  50-325-40 MG tablet Commonly known as: FIORICET Take 1 tablet once a day as needed for severe migraine. Do not take more than 3 a week.   Calcium  Carbonate 500 MG Chew Chew 500 mg by mouth in the morning and at bedtime.   fexofenadine 180 MG tablet Commonly known as: ALLEGRA Take 180 mg by mouth daily.   fexofenadine 60 MG tablet Commonly known as: ALLEGRA Take 60 mg by mouth daily.   guaiFENesin 600 MG 12 hr tablet Commonly known as: MUCINEX Take 600 mg by mouth 2 (two) times daily as needed.   lisinopril  5 MG tablet Commonly known as: ZESTRIL  Take 1 tablet (5 mg total) by mouth daily.   nystatin  powder Commonly known as: MYCOSTATIN /NYSTOP  Apply 1 application. topically 2 (two) times daily as needed (rash under breast).   OMEGA-3 FISH OIL PO Take 1 capsule by mouth daily.   omeprazole 20 MG capsule Commonly known as: PRILOSEC Take 20 mg by mouth daily.   ondansetron  4 MG tablet Commonly known as: ZOFRAN  Take 4 mg by mouth every 6 (six) hours as needed for nausea or vomiting.   polyethylene glycol 17 g packet Commonly  known as: MIRALAX  / GLYCOLAX  Take 17 g by mouth daily as needed for mild constipation.   rosuvastatin  10 MG tablet Commonly known as: CRESTOR  Take 1 tablet (10 mg total) by mouth daily.   Systane Balance 0.6 % Soln Generic drug: Propylene Glycol Place 1 drop into both eyes 2 (two) times daily.   Therems-M Tabs Take 1 tablet by mouth daily.   tiZANidine  2 MG tablet Commonly known as: ZANAFLEX  Take 1 tablet as needed once a day for headache (Give Tizanidine  instead of Tylenol )   topiramate  100 MG tablet Commonly known as: TOPAMAX  Take 300 mg by mouth 2 (two) times daily.   vitamin D3 25 MCG tablet Commonly known as: CHOLECALCIFEROL  Take 1,000 Units by mouth daily.   zinc oxide 20 % ointment Apply 1 application. topically as needed (after every incontinent episode).        Review of Systems  Constitutional:  Negative for activity change, fatigue and fever.  HENT:  Positive for trouble swallowing. Negative for congestion, rhinorrhea and sore throat.   Eyes:  Negative for visual disturbance.       Lateral right peripheral vision lost  Respiratory:  Negative for cough, shortness of breath and wheezing.   Cardiovascular:  Positive for leg swelling.  Gastrointestinal:  Negative for abdominal pain and constipation.  Genitourinary:  Positive for frequency. Negative for dysuria and urgency.       Chronic  Musculoskeletal:  Positive for arthralgias, gait problem and joint swelling.       R hip pain, better. Right  ankle brace. R wrist contracture.   Skin:  Negative for color change.  Neurological:  Positive for speech difficulty and weakness. Negative for seizures and headaches.       Difficulty words finding, memory lapses.   Psychiatric/Behavioral:  Negative for behavioral problems and sleep disturbance. The patient is not nervous/anxious.     Immunization History  Administered Date(s) Administered   INFLUENZA, HIGH DOSE SEASONAL PF 02/24/2021, 02/02/2023   Influenza Inj  Mdck Quad Pf 02/16/2017   Influenza Inj Mdck Quad With Preservative 02/16/2018, 03/01/2019   Influenza,inj,Quad PF,6+ Mos 03/01/2017   Influenza,inj,quad, With Preservative 02/18/2015   Influenza-Unspecified 03/01/2017, 12/17/2019, 02/24/2021, 02/22/2022   Moderna Covid-19 Vaccine  Bivalent Booster 82yrs & up 09/18/2021, 02/16/2023   PFIZER(Purple Top)SARS-COV-2 Vaccination 07/22/2019, 08/12/2019, 03/10/2020, 11/10/2020, 01/20/2021   PPD Test 01/28/2021, 07/17/2021, 07/30/2021   Pfizer Covid-19 Vaccine Bivalent Booster 75yrs & up 03/04/2022   Pneumococcal Conjugate-13 04/18/2016   Pneumococcal Polysaccharide-23 05/03/2004   Pneumococcal-Unspecified 05/03/2004   Respiratory Syncytial Virus Vaccine,Recomb Aduvanted(Arexvy) 04/30/2022   Td 08/11/2009, 10/19/2019   Td (Adult),unspecified 10/19/2019   Tdap 08/11/2009   Zoster, Live 04/14/2012   Pertinent  Health Maintenance Due  Topic Date Due   Influenza Vaccine  12/02/2023   Colonoscopy  01/09/2024   Mammogram  11/29/2025   DEXA SCAN  Completed      02/26/2022    1:30 PM 07/14/2022    9:17 AM 07/27/2022   12:32 PM 02/16/2023    9:40 AM 07/18/2023    9:47 AM  Fall Risk  Falls in the past year? 0 0 0 0 0  Was there an injury with Fall? 0 0 0 0   Fall Risk Category Calculator 0 0 0 0   Fall Risk Category (Retired) Low       (RETIRED) Patient Fall Risk Level High fall risk       Patient at Risk for Falls Due to  History of fall(s) History of fall(s)  Impaired balance/gait;Impaired mobility  Fall risk Follow up Falls evaluation completed  Falls evaluation completed Falls evaluation completed Falls evaluation completed      Data saved with a previous flowsheet row definition   Functional Status Survey:    Vitals:   01/31/24 1229 02/02/24 1201  BP: (!) 146/60 121/68  Pulse: 73   Resp: 19   Temp: (!) 97.5 F (36.4 C)   SpO2: 96%   Weight: 158 lb 9.6 oz (71.9 kg)    Body mass index is 26.39 kg/m. Physical Exam Vitals and  nursing note reviewed.  Constitutional:      Appearance: Normal appearance.  HENT:     Head: Normocephalic and atraumatic.     Nose: Nose normal.     Mouth/Throat:     Mouth: Mucous membranes are moist.  Eyes:     Extraocular Movements: Extraocular movements intact.     Pupils: Pupils are equal, round, and reactive to light.  Cardiovascular:     Rate and Rhythm: Normal rate and regular rhythm.     Heart sounds: No murmur heard. Pulmonary:     Effort: Pulmonary effort is normal.     Breath sounds: No rales.  Abdominal:     General: Bowel sounds are normal.     Palpations: Abdomen is soft.     Tenderness: There is no abdominal tenderness.  Musculoskeletal:        General: Tenderness and deformity present.     Cervical back: Normal range of motion and neck supple.  Right lower leg: Edema present.     Left lower leg: Edema present.     Comments: Right wrist contracture, right ankle brace. Trace edema BLE Left wrist tenderness/discomfort with range of motion, mild redness and warmth, no skin breakdown  Skin:    General: Skin is warm and dry.  Neurological:     General: No focal deficit present.     Mental Status: She is alert and oriented to person, place, and time. Mental status is at baseline.     Cranial Nerves: No cranial nerve deficit.     Motor: Weakness present.     Coordination: Coordination abnormal.     Gait: Gait abnormal.     Comments: Right wrist contracture, muscle strength 1/5 RUE, 4-5 RLE. Uses R ankle brace.  Psychiatric:        Mood and Affect: Mood normal.        Behavior: Behavior normal.        Thought Content: Thought content normal.     Labs reviewed: Recent Labs    06/21/23 0000  NA 139  K 4.2  CL 110*  CO2 23*  BUN 32*  CALCIUM  8.8   Recent Labs    06/21/23 0000  AST 13  ALT 14  ALKPHOS 128*  ALBUMIN 4.1   Recent Labs    06/21/23 0000  WBC 5.6  NEUTROABS 3,534.00  HGB 14.0  HCT 43  PLT 271   Lab Results  Component Value  Date   TSH 3.039 07/15/2021   Lab Results  Component Value Date   HGBA1C 5.7 06/21/2023   Lab Results  Component Value Date   CHOL 146 06/21/2023   HDL 46 06/21/2023   LDLCALC 76 06/21/2023   LDLDIRECT 66 07/01/2022   TRIG 142 06/21/2023   CHOLHDL 2.7 07/09/2021    Significant Diagnostic Results in last 30 days:  No results found.  Assessment/Plan  HTN (hypertension) Blood pressure is  controlled, on Lisinopril   Hyperlipidemia, mixed LDL 76 06/21/23, Cardiology Rosuvastatin   Mild cognitive impairment  difficulty word findings/forming sentences. 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 MMSE 28/30 08/30/23  Migraine no flare ups since 2017, Tylenol , Zanaflex , treated with steroids in the past.   Pulmonary nodules  f/u Pulmonology, R upper lobe nodule, had bronchoscopy 04/21/21-negative for malignancy or infection.  07/18/23 pulmonology CT 08/10/23 stable in the past 3 years, f/u 5 years recommended.   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 takes Baclofen  nightly.              Hx of cerebral AVMs  Gait abnormality patient ambulated with walker, R ankle brace.   Prediabetes Hgb A1c 5.7 06/21/23, diet controlled.   Coronary artery calcification no angina. F/u Cardiology.  01/23/2024 heart care no medication changes follow-up 1 year  Osteoarthritis  on Tylenol  for pain, R hip ORIF 07/09/21  Elevated LFTs Elevated alk phos 173 2/29/42>>154(37-153)   Family/ staff Communication: Plan of care reviewed with the patient and charge nurse  Labs/tests ordered: None

## 2024-01-31 NOTE — Assessment & Plan Note (Addendum)
 no angina. F/u Cardiology.  01/23/2024 heart care no medication changes follow-up 1 year

## 2024-01-31 NOTE — Assessment & Plan Note (Signed)
 f/u Pulmonology, R upper lobe nodule, had bronchoscopy 04/21/21-negative for malignancy or infection.  07/18/23 pulmonology CT 08/10/23 stable in the past 3 years, f/u 5 years recommended.

## 2024-01-31 NOTE — Assessment & Plan Note (Signed)
 Hgb A1c 5.7 06/21/23, diet controlled.

## 2024-01-31 NOTE — Assessment & Plan Note (Signed)
Blood pressure is controlled, on Lisinopril.

## 2024-01-31 NOTE — Assessment & Plan Note (Signed)
 LDL 76 06/21/23, Cardiology Rosuvastatin 

## 2024-01-31 NOTE — Assessment & Plan Note (Signed)
 R hemiparesis, lateral right peripheral vision deficit, right wrist contracture, as an result of a hemorrhagic stroke at age of 18 and subsequent strokes x5 per patient. takes Baclofen  nightly.              Hx of cerebral AVMs

## 2024-01-31 NOTE — Assessment & Plan Note (Signed)
 difficulty word findings/forming sentences. 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 MMSE 28/30 08/30/23

## 2024-01-31 NOTE — Assessment & Plan Note (Signed)
 on Tylenol  for pain, R hip ORIF 07/09/21

## 2024-01-31 NOTE — Assessment & Plan Note (Signed)
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

## 2024-01-31 NOTE — Assessment & Plan Note (Signed)
 Elevated alk phos 173 2/29/42>>154(37-153)

## 2024-01-31 NOTE — Assessment & Plan Note (Signed)
no flare ups since 2017, Tylenol, Zanaflex, treated with steroids in the past.  

## 2024-02-16 ENCOUNTER — Ambulatory Visit (INDEPENDENT_AMBULATORY_CARE_PROVIDER_SITE_OTHER): Payer: Medicare Other | Admitting: Neurology

## 2024-02-16 ENCOUNTER — Encounter: Payer: Self-pay | Admitting: Neurology

## 2024-02-16 VITALS — BP 137/84 | HR 89 | Wt 158.0 lb

## 2024-02-16 DIAGNOSIS — G40209 Localization-related (focal) (partial) symptomatic epilepsy and epileptic syndromes with complex partial seizures, not intractable, without status epilepticus: Secondary | ICD-10-CM | POA: Diagnosis not present

## 2024-02-16 DIAGNOSIS — G43009 Migraine without aura, not intractable, without status migrainosus: Secondary | ICD-10-CM

## 2024-02-16 NOTE — Progress Notes (Signed)
 NEUROLOGY FOLLOW UP OFFICE NOTE  Tonya Thompson [id number] [birth year]  HISTORY OF PRESENT ILLNESS: I had the pleasure of seeing Tonya Thompson in follow-up in the neurology clinic on 02/16/2024.  The patient was last seen a year ago for well-controlled seizures and migraines. She is alone in the office today. Records and images were personally reviewed where available. She reports she has been doing well. She has been seizure-free since 1998. The migraines are controlled as well, she has not needed prn Fioricet or Tizanidine . She is on Topiramate  300mg  BID (100mg : 3 tabs BID) for seizure and migraine prophylaxis without side effects. She denies any dizziness, vision changes, new focal symptoms. No pain. She takes Baclofen  10mg  at bedtime for the spasticity. Sleep is good. No falls.    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   1. Seizures. She had a hemorrhagic stroke at age 68, then had subsequent strokes after, always affecting the right side, last was in 1984. She has been on Topiramate  300mg  BID for many years, switched to generic in 1999. Her last seizure was in 1998. She was last seen by Dr. Wanda Hutchinson at the [facility] in 06/2018. Per notes, she has abnormal EEGs with left hemisphere slowing, maximal left temporal central area with epileptiform activity. She denies any side effects on Topiramate . She denies any staring/unresponsive episodes, gaps in time, olfactory/gustatory hallucinations, myoclonic jerks. She had a normal birth and early development.  There is no history of febrile convulsions, CNS infections such as meningitis/encephalitis, or family history of seizures.  2. Status migrainosus. She has a history of migraines with prn Fioricet  for rescue. She reports being migraine-free since 2017, however at that time she was also in status migrainosus with migraines that lasted 8 days. She recalls taking a 1-week prednisone  course with Dr. Hutchinson with resolution of migraines. This headache started 2 weeks ago, she reports 10/10 throbbing pain in the frontal regions, with tenderness to palpation. She has been nauseated, no vomiting. No visual obscurations. She is sensitive to lights. She was prescribed Prednisone  20mg  2 tab daily for 3 days, then 1 tab daily for 4 days. She is saying she was only taking 1 tablet daily and took it for only 3-4 days because it was not working. Headaches progressively worsened that she has been to the ER 3 times in the past week. In the ER, she has been given IV Reglan /Benadryl , IV Fentanyl , PO Decadron  through her PCP, Vicodin from the ER, and most recently IV Depacon  1000mg  and magnesium  2 days ago. She had a sample of Nurtec yesterday from PCP office which only made her sleep 2 hours but did not help with headache. She had a CTA head and neck on 07/24/20 which did not show any acute changes. There was a chronic left MCA infarct with small areas of calcific density within the infarct. There was a cluster of vessels in the left sylvian fissure compatible with residual AVM, appears to drain into the vein of Lab[place]. ESR was normal (11). She states she stopped the Dexamethasone  for a few days but took it again this morning. She states that she had done well with migraines by staying away from food triggers, she thinks there may have been something in her food that set this off.  She manages her medications and denies missing doses. She has some soreness in her neck. She takes Baclofen  for leg spasticity. She used to get 8 hours of sleep, but since headaches recurred, she only gets 5 hours.   Update 07/29/21: She presents for an earlier visit after hospitalization from 3/12-3/17/2023 for altered mental status. She recently had hip  surgery for a hip fracture, then after arriving to the facility, she was more confused and sent back to the ER. She was treated for a UTI with urine culture showing E.coli and E. Cloacae. She was also felt to have hyperammonemic encephalopathy due to Depakote  (ammonia level 76) and this was stopped. 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 She had an overnight EEG which showed spikes in the left centrotemporal region, focal slowing in the left temporal region and diffuse slowing, no seizures seen. Levetiracetam  was stopped. She was discharged on home dose of Topiramate  300mg  BID. She had initially presented in status migrainosus in 07/2020, at which time Depakote  was added. She states today that she does not have migraines as long as she avoids her food triggers. She denies any significant headaches and uses an ice pack in the back of her head as needed. She feels her thinking is clearer, speech today is fluent, she is able to report that she is feeling a little down due to her father's passing before she had her hip fracture. She feels she is overall doing better, doing really well with therapy. She takes oxycodone  every 4-6 hours for post-surgical pain. She wakes up every so often at night and naps during the day.    PAST MEDICAL HISTORY: Past Medical History:  Diagnosis Date   Acute kidney injury 09/03/2019   05-2019 kidney stone removed   Allergy    seasonal allergies   Arthritis    LEFT hand   AVM (arteriovenous malformation) brain    Cataract    bilateral-not a surgical candidate at this time (07/23/2020)   COVID-19 virus infection 08/2020   GERD (gastroesophageal reflux disease)    on meds   Hyperlipidemia    diet controlled   Insulin  resistance 02/16/2018   Seizures (HCC)    Grand Mal & Partial complex seizure disorder-last 1998   Stroke [facility])    5 total so far (age [phone])   Ureteral stone  05/10/2019   has multiple kidney stones noted from Urologist   Uses roller walker     MEDICATIONS: Current Outpatient Medications on File Prior to Visit  Medication Sig Dispense Refill   acetaminophen  (TYLENOL ) 325 MG tablet Take 650 mg by mouth every 4 (four) hours as needed for headache or mild pain.     baclofen  (LIORESAL ) 10 MG tablet Take 10 mg by mouth at bedtime.     butalbital -acetaminophen -caffeine  (FIORICET) 50-325-40 MG tablet Take 1 tablet once a day as needed for severe migraine. Do not take more than 3 a week. 10 tablet 5   Calcium  Carbonate 500 MG CHEW Chew 500 mg by mouth in the morning and at bedtime.     fexofenadine (ALLEGRA) 180 MG tablet Take 180 mg by mouth daily.     fexofenadine (ALLEGRA) 60 MG tablet Take 60 mg by mouth daily.     guaiFENesin (MUCINEX) 600 MG 12 hr tablet Take 600 mg by mouth 2 (two) times daily as needed.     lisinopril  (ZESTRIL ) 5 MG tablet Take 1 tablet (5  mg total) by mouth daily. 90 tablet 3   Multiple Vitamins-Minerals (THEREMS-M) TABS Take 1 tablet by mouth daily.     nystatin  (MYCOSTATIN /NYSTOP ) powder Apply 1 application. topically 2 (two) times daily as needed (rash under breast).     Omega-3 Fatty Acids (OMEGA-3 FISH OIL PO) Take 1 capsule by mouth daily.     omeprazole (PRILOSEC) 20 MG capsule Take 20 mg by mouth daily.     ondansetron  (ZOFRAN ) 4 MG tablet Take 4 mg by mouth every 6 (six) hours as needed for nausea or vomiting.     polyethylene glycol (MIRALAX  / GLYCOLAX ) 17 g packet Take 17 g by mouth daily as needed for mild constipation. 14 each 0   Propylene Glycol (SYSTANE BALANCE) 0.6 % SOLN Place 1 drop into both eyes 2 (two) times daily.     rosuvastatin  (CRESTOR ) 10 MG tablet Take 1 tablet (10 mg total) by mouth daily. 90 tablet 3   tiZANidine  (ZANAFLEX ) 2 MG tablet Take 1 tablet as needed once a day for headache (Give Tizanidine  instead of Tylenol ) 30 tablet 6   topiramate  (TOPAMAX ) 100 MG tablet Take 300 mg by mouth 2 (two)  times daily.     Vitamin D3 (VITAMIN D ) 25 MCG tablet Take 1,000 Units by mouth daily.     zinc oxide 20 % ointment Apply 1 application. topically as needed (after every incontinent episode).     No current facility-administered medications on file prior to visit.    ALLERGIES: Allergies  Allergen Reactions   Aspirin Other (See Comments)    CONTRAINDICATED DUE TO HISTORY OF BLEEDING IN BRAIN   Cheese     Cant take due to history of severe migraines   Chocolate     Cant take due to history of severe migraines   Coffee Flavoring Agent (Non-Screening)     Cant take due to history of severe migraines   Other      Nuts - Cant take due to history of severe migraines   Red Wine Complex [Germanium]     Cant take due to history of severe migraines    FAMILY HISTORY: Family History  Problem Relation Age of Onset   Heart disease Mother    Heart failure Mother    Heart attack Mother    Diabetes Father    Hyperlipidemia Father    Dementia Father    Prostate cancer Father    Melanoma Father    Colon polyps Father    Hyperlipidemia Paternal Uncle    Hypertension Paternal Uncle    Dementia Paternal Uncle    Heart attack Maternal Grandfather    Stroke Paternal Grandmother    CAD Maternal Uncle    COPD Maternal Uncle        smoker   Breast cancer Neg Hx    Colon cancer Neg Hx    Esophageal cancer Neg Hx    Stomach cancer Neg Hx    Rectal cancer Neg Hx     SOCIAL HISTORY: Social History   Socioeconomic History   Marital status: Single    Spouse name: Not on file   Number of children: Not on file   Years of education: Not on file   Highest education level: Not on file  Occupational History   Not on file  Tobacco Use   Smoking status: Never    Passive exposure: Past   Smokeless tobacco: Never  Vaping Use   Vaping status: Never Used  Substance and Sexual Activity  Alcohol  use: No   Drug use: No   Sexual activity: Not Currently    Birth control/protection: None     Comment: 1st intercourse 68 yo-Fewer than 5 partners  Other Topics Concern   Not on file  Social History Narrative   Right handed until 3rd grade    Left handed   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    Social Drivers of Health   Financial Resource Strain: Not on file  Food Insecurity: Not on file  Transportation Needs: Not on file  Physical Activity: Inactive (08/22/2018)   Exercise Vital Sign    Days of Exercise per Week: 0 days    Minutes of Exercise per Session: 0 min  Stress: No Stress Concern Present (08/22/2018)   [employer] of Occupational Health - Occupational Stress Questionnaire    Feeling of Stress : Only a little  Social Connections: Not on file  Intimate Partner Violence: Not on file     PHYSICAL EXAM: Vitals:   02/16/24 1010  BP: 137/84  Pulse: 89  SpO2: 96%   General: No acute distress Head:  Normocephalic/atraumatic Skin/Extremities: No rash, no edema Neurological Exam: alert and awake. No aphasia or dysarthria. Fund of knowledge is appropriate. Attention and concentration are normal.   Cranial nerves: Pupils equal, round. Extraocular movements intact with no nystagmus. Right homonymous hemianopia. No facial asymmetry.  Motor: increased tone on right UE and LE with spastic contracture of right UE (unchanged), able to lift right arm anti-gravity. 5/5 left UE and LE. Gait spastic hemiparetic gait with walker. No tremors.    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 MRI/MRV/CTA in 08/2020 no acute changes. She reports doing well this past year. No seizures since 1998, migraines under control as well, she has not needed prn Fioricet. Continue Topiramate  300mg  BID and Baclofen  10mg  at bedtime. She does not drive. Follow-up in 1 year, call for any changes.   Thank you for allowing me  to participate in her care.  Please do not hesitate to call for any questions or concerns.    Darice Shivers, M.D.   CC: Man Mast, NP

## 2024-02-16 NOTE — Patient Instructions (Signed)
 It's good to see you doing well! Continue all your medications. Follow-up in 1 year, call for any changes.    Seizure Precautions: 1. If medication has been prescribed for you to prevent seizures, take it exactly as directed.  Do not stop taking the medicine without talking to your doctor first, even if you have not had a seizure in a long time.   2. Avoid activities in which a seizure would cause danger to yourself or to others.  Don't operate dangerous machinery, swim alone, or climb in high or dangerous places, such as on ladders, roofs, or girders.  Do not drive unless your doctor says you may.  3. If you have any warning that you may have a seizure, lay down in a safe place where you can't hurt yourself.    4.  No driving for 6 months from last seizure, as per [place]  state law.   Please refer to the following link on the [employer]'s website for more information: http://www.epilepsyfoundation.org/answerplace/Social/driving/drivingu.cfm   5.  Maintain good sleep hygiene.  6.  Contact your doctor if you have any problems that may be related to the medicine you are taking.  7.  Call 911 and bring the patient back to the ED if:        A.  The seizure lasts longer than 5 minutes.       B.  The patient doesn't awaken shortly after the seizure  C.  The patient has new problems such as difficulty seeing, speaking or moving  D.  The patient was injured during the seizure  E.  The patient has a temperature over 102 F (39C)  F.  The patient vomited and now is having trouble breathing

## 2024-03-20 ENCOUNTER — Encounter: Payer: Self-pay | Admitting: Neurology

## 2024-03-23 ENCOUNTER — Encounter: Payer: Self-pay | Admitting: Nurse Practitioner

## 2024-03-23 ENCOUNTER — Non-Acute Institutional Stay: Payer: PRIVATE HEALTH INSURANCE | Admitting: Nurse Practitioner

## 2024-03-23 DIAGNOSIS — I10 Essential (primary) hypertension: Secondary | ICD-10-CM

## 2024-03-23 DIAGNOSIS — R7303 Prediabetes: Secondary | ICD-10-CM

## 2024-03-23 DIAGNOSIS — G3184 Mild cognitive impairment, so stated: Secondary | ICD-10-CM | POA: Diagnosis not present

## 2024-03-23 DIAGNOSIS — G40909 Epilepsy, unspecified, not intractable, without status epilepticus: Secondary | ICD-10-CM | POA: Diagnosis not present

## 2024-03-23 DIAGNOSIS — E782 Mixed hyperlipidemia: Secondary | ICD-10-CM

## 2024-03-23 DIAGNOSIS — G8191 Hemiplegia, unspecified affecting right dominant side: Secondary | ICD-10-CM | POA: Diagnosis not present

## 2024-03-23 NOTE — Assessment & Plan Note (Signed)
Blood pressure is controlled, on Lisinopril.

## 2024-03-23 NOTE — Assessment & Plan Note (Signed)
 R hemiparesis, lateral right peripheral vision deficit, right wrist contracture, as an result of a hemorrhagic stroke at age of 18 and subsequent strokes x5 per patient. takes Baclofen  nightly.              Hx of cerebral AVMs

## 2024-03-23 NOTE — Assessment & Plan Note (Signed)
 Impaired cognition, difficulty word findings/forming sentences. 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 MMSE 28/30 08/30/23

## 2024-03-23 NOTE — Progress Notes (Signed)
 Location:   [facility] Room Number: 819 Place of Service:    Provider: Larwance Hark NP  Yuko Coventry X, NP  Patient Care Team: Rowen Hur X, NP as PCP - General (Internal Medicine) Santo Stanly LABOR, MD as PCP - Cardiology (Cardiology) Georjean Darice HERO, MD as Consulting Physician (Neurology) Prescilla Beams, MD as Consulting Physician (Nephrology) Renda Glance, MD as Consulting Physician (Urology)  Extended Emergency Contact Information Primary Emergency Contact: Devra Sari Morita, [place] Home Phone: [phone] Mobile Phone: [phone] Relation: Cousin Secondary Emergency Contact: Roper,Paula Address: [address]          [place], [place] [address] [place] Home Phone: [phone] Mobile Phone: [phone] Relation: Sister  Code Status:  DNR Goals of care: Advanced Directive information    02/16/2024   10:09 AM  Advanced Directives  Does Patient Have a Medical Advance Directive? Yes  Type of Advance Directive Living will     Chief Complaint  Patient presents with   Medical Management of Chronic Issues    HPI:  Pt is a 68 y.o. female seen today for medical management of chronic diseases.     Elevated alk phos 173 2/29/42>>154(37-153) Constipation, stable, prn MiraLax              R hip pain, on Tylenol  for pain, R hip ORIF 07/09/21             Dysphagia 2, thin liquid.              GERD, stable, takes Tums.              Hypokalemia, K 4.2 06/21/23             CAD, no angina. F/u Cardiology. 01/23/2024 heart care no medication changes follow-up 1 year             HTN, controlled, on Lisinopril              HLD LDL 76 06/21/23, Cardiology Rosuvastatin              Impaired cognition, difficulty word findings/forming sentences. 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 MMSE 28/30 08/30/23 Migraines, no flare ups since 2017, Tylenol , Zanaflex , treated with steroids  in the past.              Pulmonary nodules, f/u Pulmonology, R upper lobe nodule, had bronchoscopy 04/21/21-negative for malignancy or infection. 07/18/23 pulmonology CT 08/10/23 stable in the past 3 years, f/u 5 years recommended.              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 takes Baclofen  nightly.              Hx of cerebral AVMs             CKD III, chronic             Gait abnormality, patient ambulated with walker, R ankle brace.              Anemia, Hgb 14 06/21/23  Prediabetes Hgb A1c 5.7 06/21/23, diet controlled.     Past Medical History:  Diagnosis Date   Acute kidney injury 09/03/2019   05-2019 kidney stone removed   Allergy    seasonal allergies   Arthritis    LEFT hand   AVM (arteriovenous malformation) brain    Cataract    bilateral-not a surgical candidate at this time (07/23/2020)   COVID-19 virus infection 08/2020   GERD (gastroesophageal reflux disease)    on meds   Hyperlipidemia    diet controlled   Insulin  resistance 02/16/2018   Seizures (HCC)    Grand Mal & Partial complex seizure disorder-last 1998   Stroke [facility])    5 total so far (age [phone])   Ureteral stone 05/10/2019   has multiple kidney stones noted from Urologist   Uses roller walker    Past Surgical History:  Procedure Laterality Date   BRAIN SURGERY  09/19/1978   at [facility], Dr. Alm Sor   BRONCHIAL BIOPSY  04/21/2021   Procedure: BRONCHIAL BIOPSIES;  Surgeon: Brenna Adine CROME, DO;  Location: [facility];  Service: Pulmonary;;   BRONCHIAL BRUSHINGS  04/21/2021   Procedure: BRONCHIAL BRUSHINGS;  Surgeon: Brenna Adine CROME, DO;  Location: [facility];  Service: Pulmonary;;    BRONCHIAL WASHINGS  04/21/2021   Procedure: BRONCHIAL WASHINGS;  Surgeon: Brenna Adine CROME, DO;  Location: [facility];  Service: Pulmonary;;   COLONOSCOPY  2017   [place], [place]   CYSTOSCOPY/URETEROSCOPY/HOLMIUM LASER/STENT PLACEMENT Left 05/10/2019   Procedure: CYSTOSCOPY/RETROGRADE/URETEROSCOPY/HOLMIUM LASER/STENT PLACEMENT;  Surgeon: Renda Glance, MD;  Location: [facility];  Service: Urology;  Laterality: Left;   FIDUCIAL MARKER PLACEMENT  04/21/2021   Procedure: FIDUCIAL MARKER PLACEMENT;  Surgeon: Brenna Adine CROME, DO;  Location: [facility];  Service: Pulmonary;;   FOOT SURGERY Right 10/1985   ankle fusion, at [facility], Dr. Janeece   INTRAMEDULLARY (IM) NAIL INTERTROCHANTERIC Right 07/09/2021   Procedure: INTRAMEDULLARY (IM) NAIL INTERTROCHANTRIC;  Surgeon: Fidel Rogue, MD;  Location: [facility];  Service: Orthopedics;  Laterality: Right;   POLYPECTOMY  2017   multiple adenomas   TUBAL LIGATION  1981   VIDEO BRONCHOSCOPY WITH RADIAL ENDOBRONCHIAL ULTRASOUND  04/21/2021   Procedure: RADIAL ENDOBRONCHIAL ULTRASOUND;  Surgeon: Brenna Adine CROME, DO;  Location: [facility];  Service: Pulmonary;;   WISDOM TOOTH EXTRACTION      Allergies  Allergen Reactions   Aspirin Other (See Comments)    CONTRAINDICATED DUE TO HISTORY OF BLEEDING IN BRAIN   Cheese     Cant take due to history of severe migraines   Chocolate     Cant take due to history of severe migraines   Coffee Flavoring Agent (Non-Screening)     Cant take due to history of severe migraines   Other      Nuts - Cant take due to history of severe migraines   Red Wine Complex [Germanium]     Cant take due to history of severe migraines    Allergies as of 03/23/2024       Reactions   Aspirin Other (See Comments)   CONTRAINDICATED DUE TO HISTORY OF BLEEDING IN BRAIN   Cheese    Cant take due to history of severe migraines   Chocolate    Cant take due to history of severe migraines   Coffee Flavoring Agent (non-screening)    Cant  take due to history of severe migraines   Other     Nuts - Cant take due to history of severe migraines  Red Wine Complex [germanium]    Cant take due to history of severe migraines        Medication List        Accurate as of March 23, 2024  4:21 PM. If you have any questions, ask your nurse or doctor.          acetaminophen  325 MG tablet Commonly known as: TYLENOL  Take 650 mg by mouth every 4 (four) hours as needed for headache or mild pain.   baclofen  10 MG tablet Commonly known as: LIORESAL  Take 10 mg by mouth at bedtime.   butalbital -acetaminophen -caffeine  50-325-40 MG tablet Commonly known as: FIORICET Take 1 tablet once a day as needed for severe migraine. Do not take more than 3 a week.   Calcium  Carbonate 500 MG Chew Chew 500 mg by mouth in the morning and at bedtime.   fexofenadine 180 MG tablet Commonly known as: ALLEGRA Take 180 mg by mouth daily.   fexofenadine 60 MG tablet Commonly known as: ALLEGRA Take 60 mg by mouth daily.   guaiFENesin 600 MG 12 hr tablet Commonly known as: MUCINEX Take 600 mg by mouth 2 (two) times daily as needed.   lisinopril  5 MG tablet Commonly known as: ZESTRIL  Take 1 tablet (5 mg total) by mouth daily.   nystatin  powder Commonly known as: MYCOSTATIN /NYSTOP  Apply 1 application. topically 2 (two) times daily as needed (rash under breast).   OMEGA-3 FISH OIL PO Take 1 capsule by mouth daily.   omeprazole 20 MG capsule Commonly known as: PRILOSEC Take 20 mg by mouth daily.   ondansetron  4 MG tablet Commonly known as: ZOFRAN  Take 4 mg by mouth every 6 (six) hours as needed for nausea or vomiting.   polyethylene glycol 17 g packet Commonly known as: MIRALAX  / GLYCOLAX  Take 17 g by mouth daily as needed for mild constipation.   rosuvastatin  10 MG tablet Commonly known as: CRESTOR  Take 1 tablet (10 mg total) by mouth daily.   Systane Balance 0.6 % Soln Generic drug: Propylene Glycol Place 1 drop into both  eyes 2 (two) times daily.   Therems-M Tabs Take 1 tablet by mouth daily.   tiZANidine  2 MG tablet Commonly known as: ZANAFLEX  Take 1 tablet as needed once a day for headache (Give Tizanidine  instead of Tylenol )   topiramate  100 MG tablet Commonly known as: TOPAMAX  Take 300 mg by mouth 2 (two) times daily.   vitamin D3 25 MCG tablet Commonly known as: CHOLECALCIFEROL  Take 1,000 Units by mouth daily.   zinc oxide 20 % ointment Apply 1 application. topically as needed (after every incontinent episode).        Review of Systems  Constitutional:  Negative for activity change, fatigue and fever.  HENT:  Positive for trouble swallowing. Negative for congestion, rhinorrhea and sore throat.   Eyes:  Negative for visual disturbance.       Lateral right peripheral vision lost  Respiratory:  Negative for cough, shortness of breath and wheezing.   Cardiovascular:  Positive for leg swelling.  Gastrointestinal:  Negative for abdominal pain and constipation.  Genitourinary:  Positive for frequency. Negative for dysuria and urgency.       Chronic  Musculoskeletal:  Positive for arthralgias, gait problem and joint swelling.       R hip pain, better. Right ankle brace. R wrist contracture.   Skin:  Negative for color change.  Neurological:  Positive for speech difficulty and weakness. Negative for seizures and headaches.  Difficulty words finding, memory lapses.   Psychiatric/Behavioral:  Negative for behavioral problems and sleep disturbance. The patient is not nervous/anxious.     Immunization History  Administered Date(s) Administered   INFLUENZA, HIGH DOSE SEASONAL PF 02/24/2021, 02/02/2023   Influenza Inj Mdck Quad Pf 02/16/2017   Influenza Inj Mdck Quad With Preservative 02/16/2018, 03/01/2019   Influenza,inj,Quad PF,6+ Mos 03/01/2017   Influenza,inj,quad, With Preservative 02/18/2015   Influenza-Unspecified 03/01/2017, 12/17/2019, 02/24/2021, 02/22/2022   Moderna Covid-19  Vaccine Bivalent Booster 38yrs & up 09/18/2021, 02/16/2023   PFIZER(Purple Top)SARS-COV-2 Vaccination 07/22/2019, 08/12/2019, 03/10/2020, 11/10/2020, 01/20/2021   PPD Test 01/28/2021, 07/17/2021, 07/30/2021   Pfizer Covid-19 Vaccine Bivalent Booster 50yrs & up 03/04/2022   Pneumococcal Conjugate-13 04/18/2016   Pneumococcal Polysaccharide-23 05/03/2004   Pneumococcal-Unspecified 05/03/2004   Respiratory Syncytial Virus Vaccine,Recomb Aduvanted(Arexvy) 04/30/2022   Td 08/11/2009, 10/19/2019   Td (Adult),unspecified 10/19/2019   Tdap 08/11/2009   Zoster, Live 04/14/2012   Pertinent  Health Maintenance Due  Topic Date Due   Influenza Vaccine  12/02/2023   Colonoscopy  01/09/2024   Mammogram  11/29/2025   Bone Density Scan  Completed      07/14/2022    9:17 AM 07/27/2022   12:32 PM 02/16/2023    9:40 AM 07/18/2023    9:47 AM 02/16/2024   10:09 AM  Fall Risk  Falls in the past year? 0 0 0 0 0  Was there an injury with Fall? 0 0 0  0  Fall Risk Category Calculator 0 0 0  0  Patient at Risk for Falls Due to History of fall(s) History of fall(s)  Impaired balance/gait;Impaired mobility   Fall risk Follow up Falls evaluation completed Falls evaluation completed Falls evaluation completed  Falls evaluation completed   Functional Status Survey:    Vitals:   03/23/24 1610  BP: 110/76  Pulse: 76  Resp: 20  Temp: (!) 97.5 F (36.4 C)  SpO2: 97%  Weight: 169 lb (76.7 kg)   Body mass index is 28.12 kg/m. Physical Exam Vitals and nursing note reviewed.  Constitutional:      Appearance: Normal appearance.  HENT:     Head: Normocephalic and atraumatic.     Nose: Nose normal.     Mouth/Throat:     Mouth: Mucous membranes are moist.  Eyes:     Extraocular Movements: Extraocular movements intact.     Pupils: Pupils are equal, round, and reactive to light.  Cardiovascular:     Rate and Rhythm: Normal rate and regular rhythm.     Heart sounds: No murmur heard. Pulmonary:      Effort: Pulmonary effort is normal.     Breath sounds: No rales.  Abdominal:     General: Bowel sounds are normal.     Palpations: Abdomen is soft.     Tenderness: There is no abdominal tenderness.  Musculoskeletal:        General: Tenderness and deformity present.     Cervical back: Normal range of motion and neck supple.     Right lower leg: Edema present.     Left lower leg: Edema present.     Comments: Right wrist contracture, right ankle brace. Trace edema BLE Left wrist tenderness/discomfort with range of motion, mild redness and warmth, no skin breakdown  Skin:    General: Skin is warm and dry.  Neurological:     General: No focal deficit present.     Mental Status: She is alert and oriented to person, place, and time. Mental  status is at baseline.     Cranial Nerves: No cranial nerve deficit.     Motor: Weakness present.     Coordination: Coordination abnormal.     Gait: Gait abnormal.     Comments: Right wrist contracture, muscle strength 1/5 RUE, 4-5 RLE. Uses R ankle brace.  Psychiatric:        Mood and Affect: Mood normal.        Behavior: Behavior normal.        Thought Content: Thought content normal.     Labs reviewed: Recent Labs    06/21/23 0000  NA 139  K 4.2  CL 110*  CO2 23*  BUN 32*  CALCIUM  8.8   Recent Labs    06/21/23 0000  AST 13  ALT 14  ALKPHOS 128*  ALBUMIN 4.1   Recent Labs    06/21/23 0000  WBC 5.6  NEUTROABS 3,534.00  HGB 14.0  HCT 43  PLT 271   Lab Results  Component Value Date   TSH 3.039 07/15/2021   Lab Results  Component Value Date   HGBA1C 5.7 06/21/2023   Lab Results  Component Value Date   CHOL 146 06/21/2023   HDL 46 06/21/2023   LDLCALC 76 06/21/2023   LDLDIRECT 66 07/01/2022   TRIG 142 06/21/2023   CHOLHDL 2.7 07/09/2021    Significant Diagnostic Results in last 30 days:  No results found.  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 takes Baclofen  nightly.              Hx of cerebral AVMs  Prediabetes Hgb A1c 5.7 06/21/23, diet controlled.   Mild cognitive impairment Impaired cognition, difficulty word findings/forming sentences. 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 MMSE 28/30 08/30/23  HTN (hypertension) Blood pressure is controlled, on Lisinopril   Hyperlipidemia, mixed  LDL 76 06/21/23, Cardiology Rosuvastatin    Family/ staff Communication: Plan of care reviewed with the patient and charge nurse  Labs/tests ordered: None

## 2024-03-23 NOTE — Assessment & Plan Note (Signed)
 LDL 76 06/21/23, Cardiology Rosuvastatin 

## 2024-03-23 NOTE — Assessment & Plan Note (Signed)
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

## 2024-03-23 NOTE — Assessment & Plan Note (Signed)
 Hgb A1c 5.7 06/21/23, diet controlled.

## 2024-06-05 ENCOUNTER — Non-Acute Institutional Stay: Admitting: Family Medicine

## 2024-06-05 DIAGNOSIS — I10 Essential (primary) hypertension: Secondary | ICD-10-CM

## 2024-06-05 DIAGNOSIS — G40909 Epilepsy, unspecified, not intractable, without status epilepticus: Secondary | ICD-10-CM | POA: Diagnosis not present

## 2024-06-05 NOTE — Assessment & Plan Note (Addendum)
 No recent seizures continue with topiramate  followed by neurology

## 2025-02-18 ENCOUNTER — Ambulatory Visit: Payer: PRIVATE HEALTH INSURANCE | Admitting: Neurology
